# Patient Record
Sex: Male | Born: 1945 | Race: Black or African American | Hispanic: No | Marital: Married | Smoking: Former smoker
Health system: Southern US, Community
[De-identification: ages and names within clinical notes are randomized; demographics above are authoritative.]

## PROBLEM LIST (undated history)

## (undated) DIAGNOSIS — E785 Hyperlipidemia, unspecified: Secondary | ICD-10-CM

## (undated) DIAGNOSIS — N529 Male erectile dysfunction, unspecified: Secondary | ICD-10-CM

## (undated) DIAGNOSIS — H8110 Benign paroxysmal vertigo, unspecified ear: Secondary | ICD-10-CM

## (undated) DIAGNOSIS — I2510 Atherosclerotic heart disease of native coronary artery without angina pectoris: Secondary | ICD-10-CM

## (undated) DIAGNOSIS — Z9189 Other specified personal risk factors, not elsewhere classified: Secondary | ICD-10-CM

## (undated) DIAGNOSIS — Z973 Presence of spectacles and contact lenses: Secondary | ICD-10-CM

## (undated) DIAGNOSIS — I739 Peripheral vascular disease, unspecified: Secondary | ICD-10-CM

## (undated) DIAGNOSIS — E119 Type 2 diabetes mellitus without complications: Secondary | ICD-10-CM

## (undated) DIAGNOSIS — K219 Gastro-esophageal reflux disease without esophagitis: Secondary | ICD-10-CM

## (undated) DIAGNOSIS — I259 Chronic ischemic heart disease, unspecified: Secondary | ICD-10-CM

## (undated) DIAGNOSIS — I10 Essential (primary) hypertension: Secondary | ICD-10-CM

## (undated) DIAGNOSIS — I509 Heart failure, unspecified: Secondary | ICD-10-CM

## (undated) DIAGNOSIS — Z5189 Encounter for other specified aftercare: Secondary | ICD-10-CM

## (undated) DIAGNOSIS — M1990 Unspecified osteoarthritis, unspecified site: Secondary | ICD-10-CM

## (undated) DIAGNOSIS — N400 Enlarged prostate without lower urinary tract symptoms: Secondary | ICD-10-CM

## (undated) DIAGNOSIS — Z8679 Personal history of other diseases of the circulatory system: Secondary | ICD-10-CM

## (undated) HISTORY — DX: Benign prostatic hyperplasia without lower urinary tract symptoms: N40.0

## (undated) HISTORY — DX: Heart failure, unspecified: I50.9

## (undated) HISTORY — PX: CORONARY ARTERY BYPASS GRAFT: SHX141

## (undated) HISTORY — DX: Peripheral vascular disease, unspecified: I73.9

## (undated) HISTORY — DX: Hyperlipidemia, unspecified: E78.5

## (undated) HISTORY — DX: Benign paroxysmal vertigo, unspecified ear: H81.10

## (undated) HISTORY — DX: Unspecified osteoarthritis, unspecified site: M19.90

## (undated) HISTORY — DX: Essential (primary) hypertension: I10

## (undated) HISTORY — DX: Atherosclerotic heart disease of native coronary artery without angina pectoris: I25.10

## (undated) HISTORY — DX: Chronic ischemic heart disease, unspecified: I25.9

## (undated) HISTORY — PX: CARDIOVASCULAR STRESS TEST: SHX262

## (undated) HISTORY — DX: Encounter for other specified aftercare: Z51.89

## (undated) HISTORY — PX: CARDIAC CATHETERIZATION: SHX172

---

## 1998-03-07 DIAGNOSIS — Z5189 Encounter for other specified aftercare: Secondary | ICD-10-CM

## 1998-03-07 HISTORY — DX: Encounter for other specified aftercare: Z51.89

## 2000-03-03 ENCOUNTER — Encounter: Payer: Self-pay | Admitting: *Deleted

## 2000-03-03 ENCOUNTER — Encounter (INDEPENDENT_AMBULATORY_CARE_PROVIDER_SITE_OTHER): Payer: Self-pay | Admitting: *Deleted

## 2000-03-03 ENCOUNTER — Encounter: Payer: Self-pay | Admitting: Cardiothoracic Surgery

## 2000-03-04 ENCOUNTER — Encounter: Payer: Self-pay | Admitting: *Deleted

## 2000-03-04 ENCOUNTER — Encounter: Payer: Self-pay | Admitting: Cardiothoracic Surgery

## 2000-03-04 ENCOUNTER — Inpatient Hospital Stay (HOSPITAL_COMMUNITY): Admission: RE | Admit: 2000-03-04 | Discharge: 2000-03-11 | Payer: Self-pay | Admitting: *Deleted

## 2000-03-06 ENCOUNTER — Encounter: Payer: Self-pay | Admitting: Cardiothoracic Surgery

## 2000-03-07 ENCOUNTER — Encounter: Payer: Self-pay | Admitting: Cardiothoracic Surgery

## 2000-03-08 ENCOUNTER — Encounter: Payer: Self-pay | Admitting: Cardiothoracic Surgery

## 2000-03-21 ENCOUNTER — Encounter (HOSPITAL_COMMUNITY): Admission: RE | Admit: 2000-03-21 | Discharge: 2000-04-03 | Payer: Self-pay | Admitting: Internal Medicine

## 2000-03-22 ENCOUNTER — Encounter: Admission: RE | Admit: 2000-03-22 | Discharge: 2000-06-20 | Payer: Self-pay | Admitting: Internal Medicine

## 2000-04-05 ENCOUNTER — Encounter (HOSPITAL_COMMUNITY): Admission: RE | Admit: 2000-04-05 | Discharge: 2000-07-04 | Payer: Self-pay | Admitting: Internal Medicine

## 2000-07-05 ENCOUNTER — Encounter (HOSPITAL_COMMUNITY): Admission: RE | Admit: 2000-07-05 | Discharge: 2000-10-03 | Payer: Self-pay | Admitting: Internal Medicine

## 2004-01-08 ENCOUNTER — Ambulatory Visit: Payer: Self-pay | Admitting: Internal Medicine

## 2004-02-16 ENCOUNTER — Ambulatory Visit: Payer: Self-pay | Admitting: Internal Medicine

## 2004-03-03 ENCOUNTER — Ambulatory Visit: Payer: Self-pay | Admitting: Internal Medicine

## 2004-04-07 ENCOUNTER — Ambulatory Visit: Payer: Self-pay | Admitting: Internal Medicine

## 2004-05-27 ENCOUNTER — Ambulatory Visit: Payer: Self-pay | Admitting: Internal Medicine

## 2004-06-01 ENCOUNTER — Ambulatory Visit: Payer: Self-pay | Admitting: Internal Medicine

## 2004-06-28 ENCOUNTER — Ambulatory Visit: Payer: Self-pay | Admitting: Internal Medicine

## 2004-08-24 ENCOUNTER — Ambulatory Visit: Payer: Self-pay | Admitting: Internal Medicine

## 2004-08-31 ENCOUNTER — Ambulatory Visit: Payer: Self-pay | Admitting: Internal Medicine

## 2004-12-03 ENCOUNTER — Ambulatory Visit: Payer: Self-pay | Admitting: Internal Medicine

## 2004-12-17 ENCOUNTER — Ambulatory Visit: Payer: Self-pay | Admitting: Internal Medicine

## 2004-12-22 ENCOUNTER — Ambulatory Visit: Payer: Self-pay | Admitting: Internal Medicine

## 2005-01-06 ENCOUNTER — Ambulatory Visit: Payer: Self-pay | Admitting: Internal Medicine

## 2005-02-07 ENCOUNTER — Ambulatory Visit: Payer: Self-pay | Admitting: Internal Medicine

## 2005-03-28 ENCOUNTER — Ambulatory Visit: Payer: Self-pay | Admitting: Internal Medicine

## 2005-06-15 ENCOUNTER — Ambulatory Visit: Payer: Self-pay | Admitting: Internal Medicine

## 2005-06-23 ENCOUNTER — Ambulatory Visit: Payer: Self-pay | Admitting: Internal Medicine

## 2005-06-28 ENCOUNTER — Ambulatory Visit: Payer: Self-pay | Admitting: Internal Medicine

## 2006-01-30 ENCOUNTER — Ambulatory Visit: Payer: Self-pay | Admitting: Internal Medicine

## 2006-02-22 ENCOUNTER — Ambulatory Visit: Payer: Self-pay | Admitting: Internal Medicine

## 2006-06-29 ENCOUNTER — Emergency Department (HOSPITAL_COMMUNITY): Admission: EM | Admit: 2006-06-29 | Discharge: 2006-06-29 | Payer: Self-pay | Admitting: Emergency Medicine

## 2006-06-29 ENCOUNTER — Ambulatory Visit: Payer: Self-pay | Admitting: Internal Medicine

## 2006-07-06 ENCOUNTER — Ambulatory Visit: Payer: Self-pay

## 2006-07-06 LAB — CONVERTED CEMR LAB
BUN: 14 mg/dL (ref 6–23)
CO2: 31 meq/L (ref 19–32)
Calcium: 9.3 mg/dL (ref 8.4–10.5)
Chloride: 106 meq/L (ref 96–112)
Creatinine, Ser: 0.9 mg/dL (ref 0.4–1.5)
GFR calc Af Amer: 110 mL/min
GFR calc non Af Amer: 91 mL/min
Glucose, Bld: 133 mg/dL — ABNORMAL HIGH (ref 70–99)
Potassium: 4.2 meq/L (ref 3.5–5.1)
Sodium: 142 meq/L (ref 135–145)

## 2007-02-20 ENCOUNTER — Ambulatory Visit: Payer: Self-pay | Admitting: Internal Medicine

## 2007-02-23 ENCOUNTER — Ambulatory Visit: Payer: Self-pay | Admitting: Internal Medicine

## 2007-02-23 LAB — CONVERTED CEMR LAB
AST: 22 units/L (ref 0–37)
Albumin: 3.6 g/dL (ref 3.5–5.2)
Alkaline Phosphatase: 86 units/L (ref 39–117)
BUN: 10 mg/dL (ref 6–23)
Basophils Absolute: 0 10*3/uL (ref 0.0–0.1)
Bilirubin Urine: NEGATIVE
Calcium: 9.1 mg/dL (ref 8.4–10.5)
Chloride: 103 meq/L (ref 96–112)
Cholesterol: 193 mg/dL (ref 0–200)
Creatinine, Ser: 1 mg/dL (ref 0.4–1.5)
Eosinophils Absolute: 0.4 10*3/uL (ref 0.0–0.6)
GFR calc non Af Amer: 80 mL/min
Ketones, ur: NEGATIVE mg/dL
MCHC: 34.3 g/dL (ref 30.0–36.0)
MCV: 84.6 fL (ref 78.0–100.0)
Monocytes Relative: 8.4 % (ref 3.0–11.0)
Neutrophils Relative %: 50.4 % (ref 43.0–77.0)
PSA: 0.96 ng/mL (ref 0.10–4.00)
Platelets: 249 10*3/uL (ref 150–400)
RBC: 4.54 M/uL (ref 4.22–5.81)
RDW: 13.2 % (ref 11.5–14.6)
Sodium: 138 meq/L (ref 135–145)
Total CHOL/HDL Ratio: 6.7
Triglycerides: 208 mg/dL (ref 0–149)
Urine Glucose: NEGATIVE mg/dL
Urobilinogen, UA: 1 (ref 0.0–1.0)
VLDL: 42 mg/dL — ABNORMAL HIGH (ref 0–40)
pH: 6 (ref 5.0–8.0)

## 2007-03-02 ENCOUNTER — Ambulatory Visit: Payer: Self-pay | Admitting: Internal Medicine

## 2007-03-02 DIAGNOSIS — K219 Gastro-esophageal reflux disease without esophagitis: Secondary | ICD-10-CM | POA: Insufficient documentation

## 2007-03-02 DIAGNOSIS — E785 Hyperlipidemia, unspecified: Secondary | ICD-10-CM | POA: Insufficient documentation

## 2007-03-02 DIAGNOSIS — I2510 Atherosclerotic heart disease of native coronary artery without angina pectoris: Secondary | ICD-10-CM | POA: Insufficient documentation

## 2007-03-02 DIAGNOSIS — I10 Essential (primary) hypertension: Secondary | ICD-10-CM | POA: Insufficient documentation

## 2007-03-02 DIAGNOSIS — E1159 Type 2 diabetes mellitus with other circulatory complications: Secondary | ICD-10-CM

## 2007-03-16 ENCOUNTER — Telehealth: Payer: Self-pay | Admitting: Internal Medicine

## 2007-03-20 ENCOUNTER — Telehealth: Payer: Self-pay | Admitting: Internal Medicine

## 2007-07-02 ENCOUNTER — Encounter: Payer: Self-pay | Admitting: Internal Medicine

## 2007-08-02 ENCOUNTER — Ambulatory Visit: Payer: Self-pay | Admitting: Internal Medicine

## 2007-08-05 LAB — CONVERTED CEMR LAB
ALT: 16 units/L (ref 0–53)
AST: 16 units/L (ref 0–37)
Albumin: 3.6 g/dL (ref 3.5–5.2)
BUN: 12 mg/dL (ref 6–23)
CO2: 28 meq/L (ref 19–32)
Calcium: 9.3 mg/dL (ref 8.4–10.5)
Chloride: 102 meq/L (ref 96–112)
Cholesterol: 158 mg/dL (ref 0–200)
Creatinine, Ser: 0.9 mg/dL (ref 0.4–1.5)
GFR calc non Af Amer: 91 mL/min
HDL: 24.3 mg/dL — ABNORMAL LOW (ref >39.0)
Hgb A1c MFr Bld: 7.1 % — ABNORMAL HIGH (ref 4.6–6.0)
LDL Cholesterol: 111 mg/dL — ABNORMAL HIGH (ref 0–99)
Triglycerides: 112 mg/dL (ref 0–149)

## 2007-08-07 ENCOUNTER — Encounter: Payer: Self-pay | Admitting: *Deleted

## 2007-08-07 ENCOUNTER — Ambulatory Visit: Payer: Self-pay | Admitting: Internal Medicine

## 2007-08-07 DIAGNOSIS — Z951 Presence of aortocoronary bypass graft: Secondary | ICD-10-CM | POA: Insufficient documentation

## 2007-08-07 DIAGNOSIS — M129 Arthropathy, unspecified: Secondary | ICD-10-CM

## 2007-08-07 DIAGNOSIS — I259 Chronic ischemic heart disease, unspecified: Secondary | ICD-10-CM

## 2007-08-07 DIAGNOSIS — Z8669 Personal history of other diseases of the nervous system and sense organs: Secondary | ICD-10-CM | POA: Insufficient documentation

## 2007-08-28 ENCOUNTER — Ambulatory Visit: Payer: Self-pay | Admitting: Internal Medicine

## 2007-08-28 DIAGNOSIS — M25559 Pain in unspecified hip: Secondary | ICD-10-CM | POA: Insufficient documentation

## 2007-09-18 ENCOUNTER — Telehealth: Payer: Self-pay | Admitting: Internal Medicine

## 2007-10-08 ENCOUNTER — Ambulatory Visit: Payer: Self-pay | Admitting: Family Medicine

## 2007-10-08 DIAGNOSIS — M169 Osteoarthritis of hip, unspecified: Secondary | ICD-10-CM

## 2007-10-08 DIAGNOSIS — M1610 Unilateral primary osteoarthritis, unspecified hip: Secondary | ICD-10-CM | POA: Insufficient documentation

## 2007-10-09 ENCOUNTER — Encounter: Payer: Self-pay | Admitting: Family Medicine

## 2008-02-15 ENCOUNTER — Ambulatory Visit: Payer: Self-pay | Admitting: Internal Medicine

## 2008-02-28 ENCOUNTER — Ambulatory Visit: Payer: Self-pay | Admitting: Internal Medicine

## 2008-05-27 ENCOUNTER — Ambulatory Visit: Payer: Self-pay | Admitting: Internal Medicine

## 2008-09-16 ENCOUNTER — Ambulatory Visit: Payer: Self-pay | Admitting: Internal Medicine

## 2008-09-16 LAB — CONVERTED CEMR LAB
Bilirubin, Direct: 0.1 mg/dL (ref 0.0–0.3)
Calcium: 9.3 mg/dL (ref 8.4–10.5)
Creatinine, Ser: 0.8 mg/dL (ref 0.4–1.5)
HDL: 33.9 mg/dL — ABNORMAL LOW (ref >39.00)
Hgb A1c MFr Bld: 9.5 % — ABNORMAL HIGH (ref 4.6–6.5)
Total Bilirubin: 0.9 mg/dL (ref 0.3–1.2)
Total CHOL/HDL Ratio: 5
Triglycerides: 224 mg/dL — ABNORMAL HIGH (ref 0.0–149.0)
VLDL: 44.8 mg/dL — ABNORMAL HIGH (ref 0.0–40.0)

## 2008-09-19 ENCOUNTER — Ambulatory Visit: Payer: Self-pay | Admitting: Internal Medicine

## 2008-09-22 ENCOUNTER — Encounter: Payer: Self-pay | Admitting: Internal Medicine

## 2008-10-24 ENCOUNTER — Ambulatory Visit: Payer: Self-pay | Admitting: Internal Medicine

## 2008-10-24 DIAGNOSIS — N320 Bladder-neck obstruction: Secondary | ICD-10-CM

## 2008-10-28 LAB — CONVERTED CEMR LAB
Bilirubin Urine: NEGATIVE
Ketones, ur: NEGATIVE mg/dL
Nitrite: NEGATIVE
Total Protein, Urine: NEGATIVE mg/dL
pH: 5 (ref 5.0–8.0)

## 2008-12-01 IMAGING — CR DG HIP (WITH OR WITHOUT PELVIS) 2-3V*L*
3 series · 3 of 3 positions shown · non-contrast
Comparison: None available

CLINICAL DATA: Pain

LEFT HIP - COMPLETE 2+ VIEW

[view not recorded (1 of 3)]
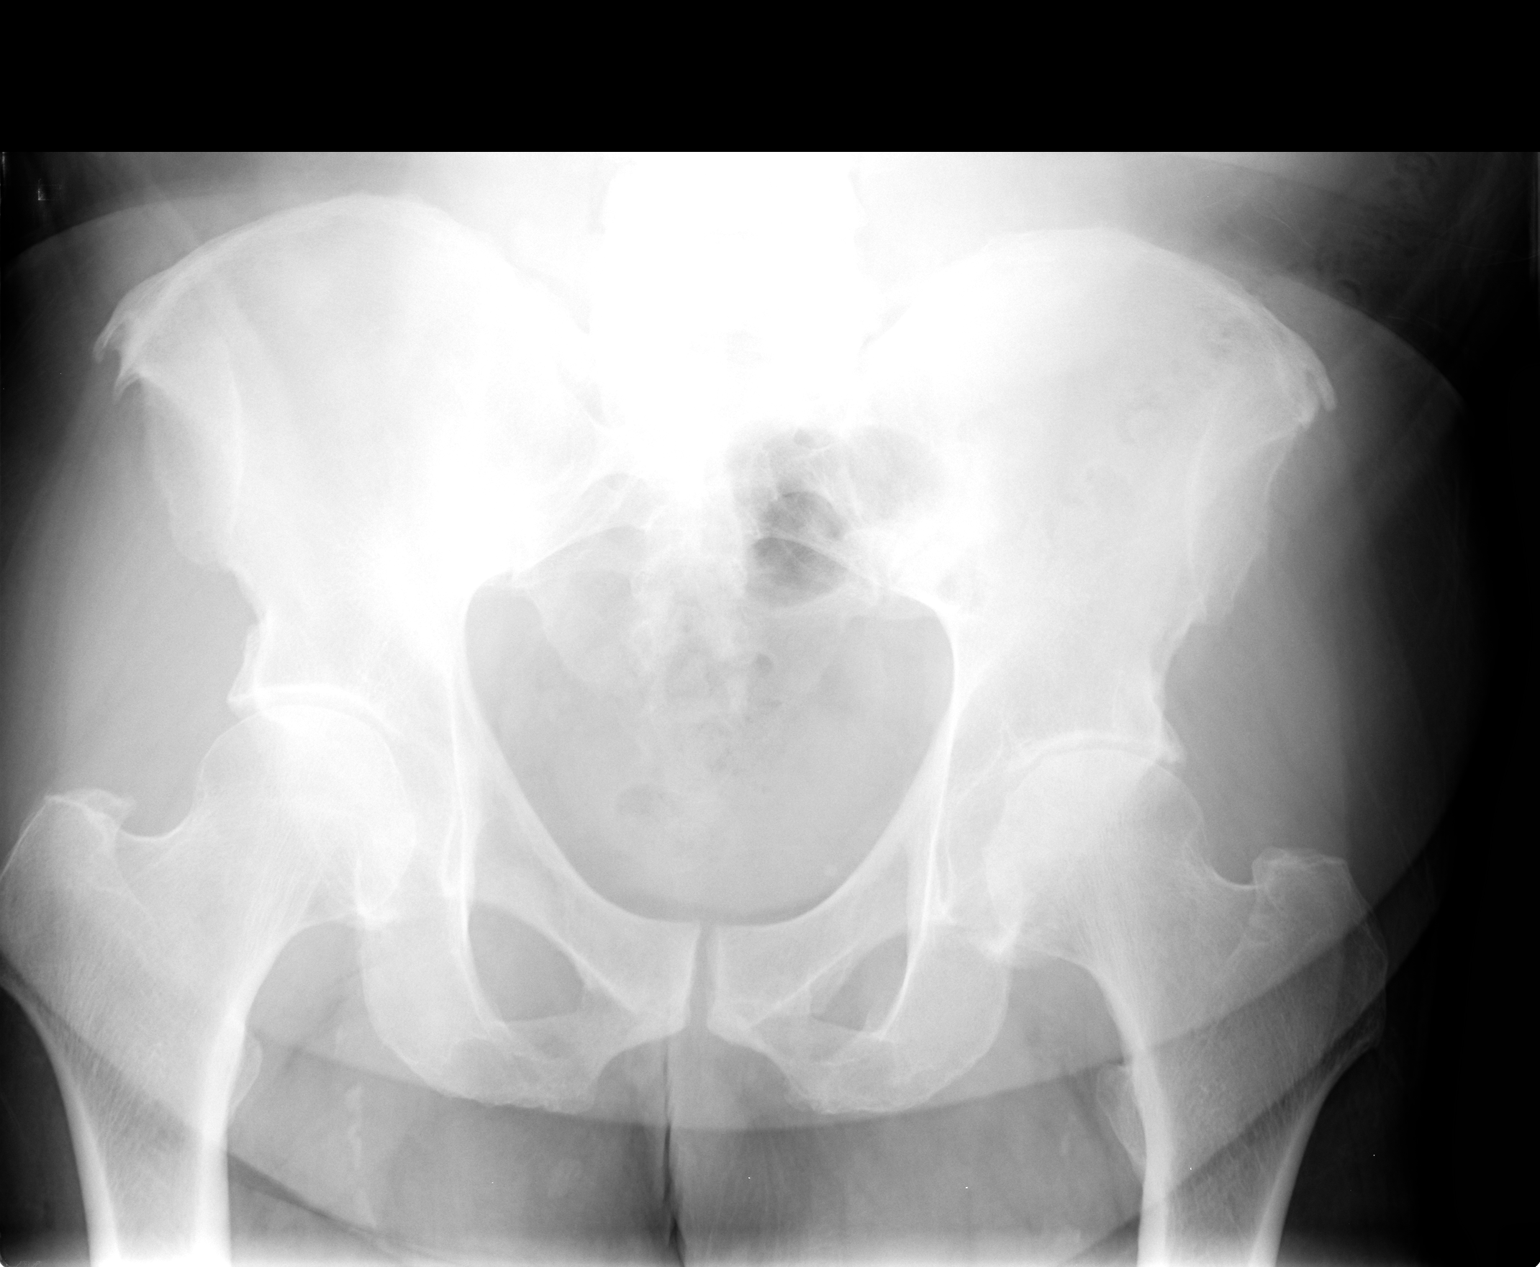

[view not recorded (2 of 3)]
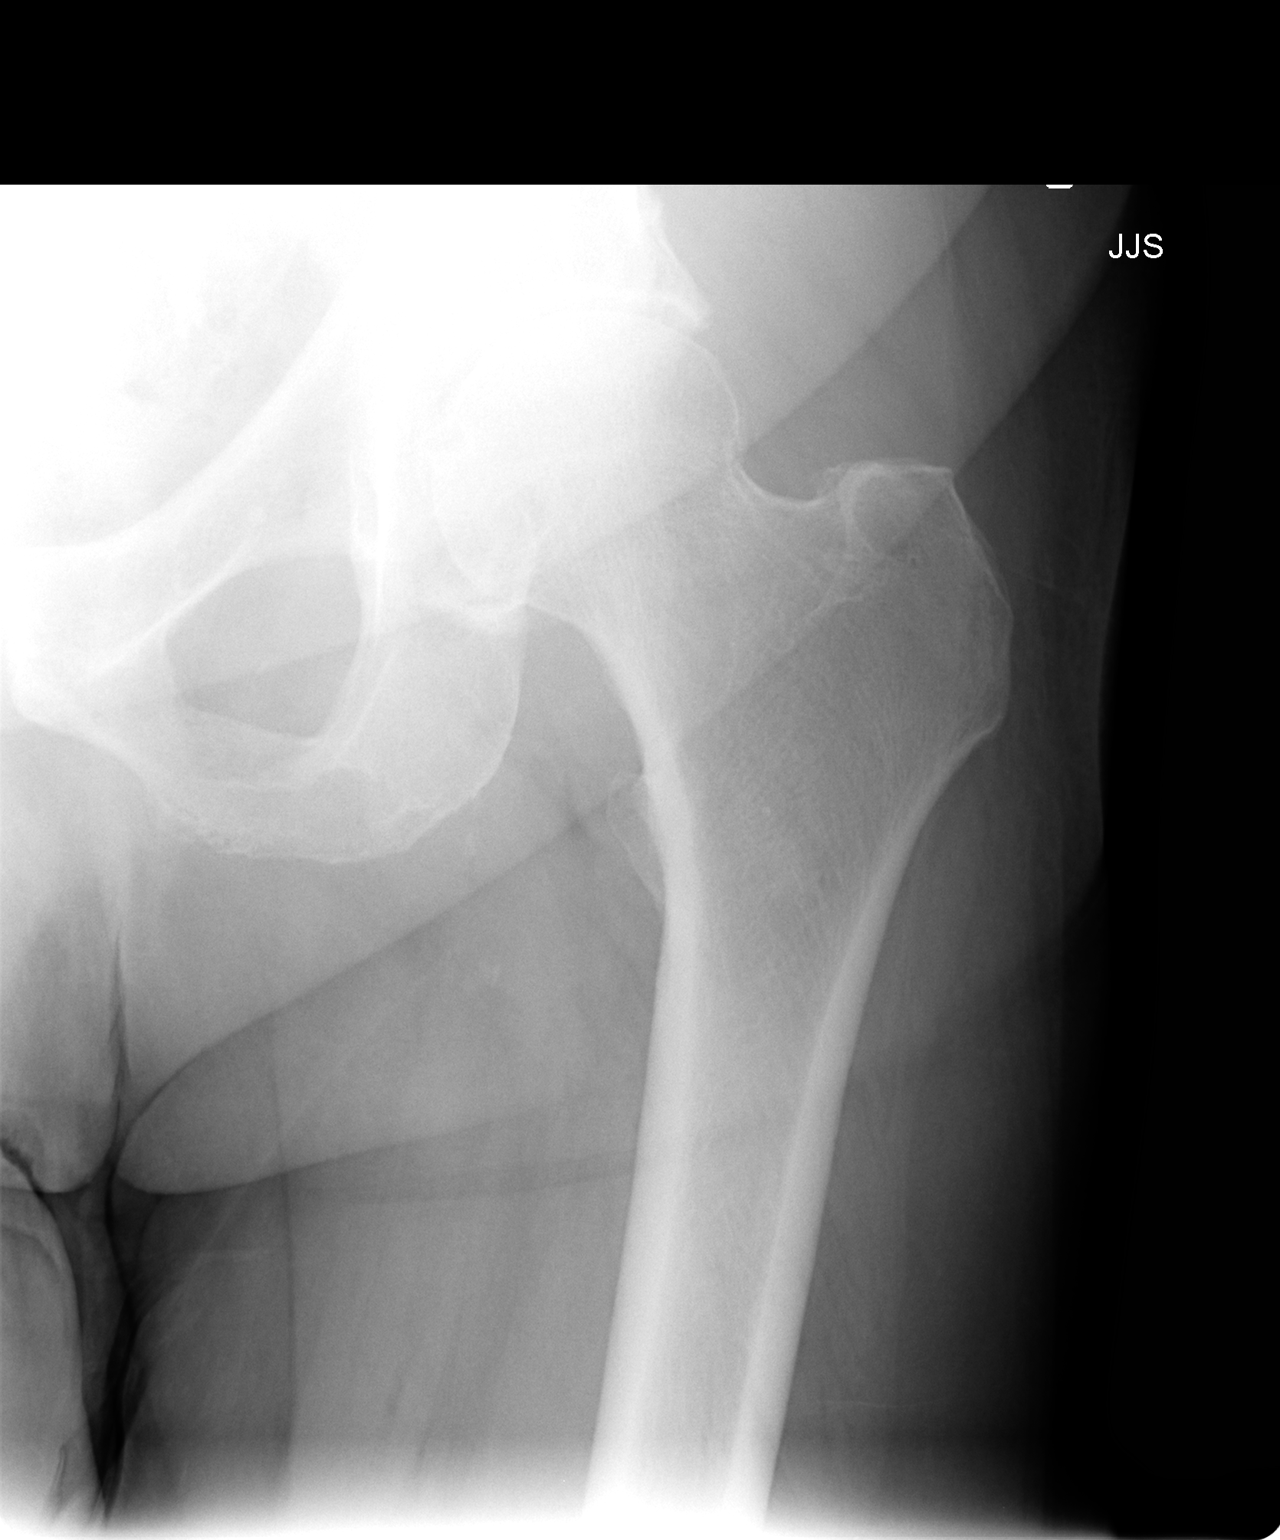

[view not recorded (3 of 3)]
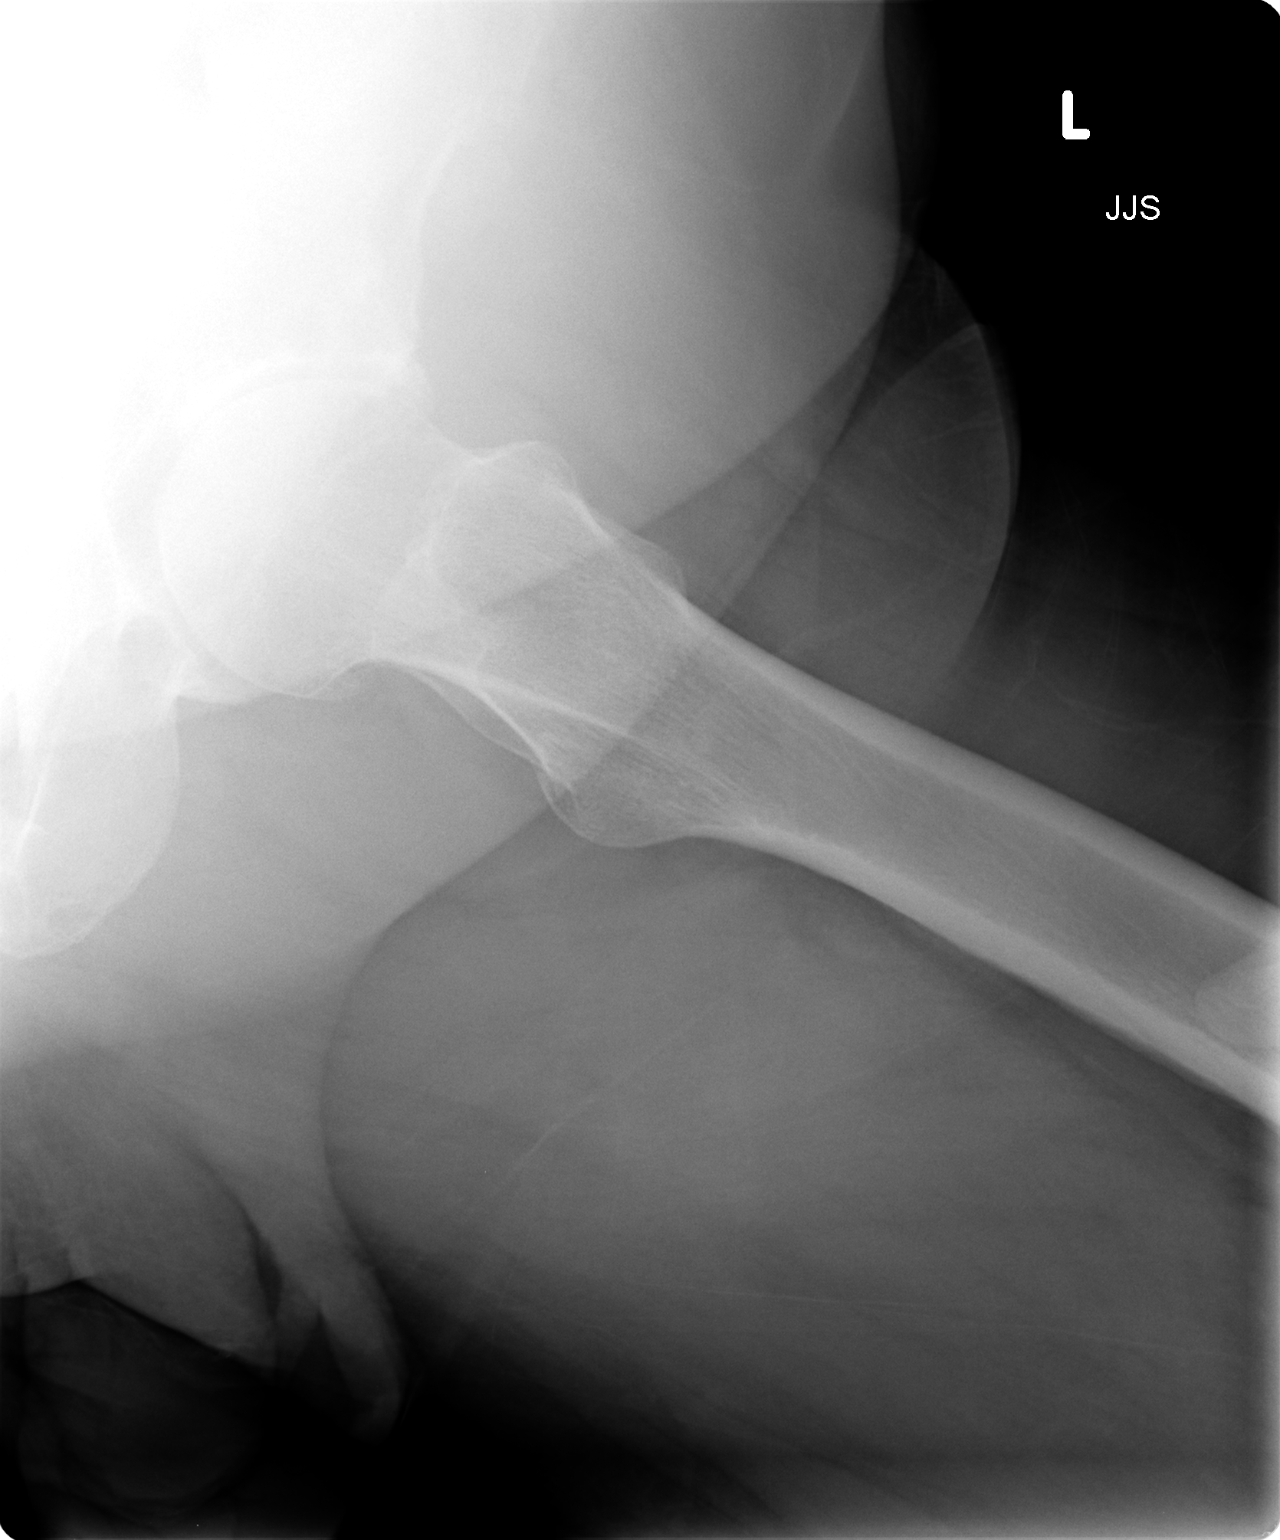

[3 of 3 positions shown; findings below may reference images not displayed]

FINDINGS: There is narrowing of the articular cartilage in the
hips, left worse than right.  Small marginal spurs are noted from
the left acetabulum.  Negative for fracture, dislocation, or other
acute bone injury.  Patchy femoral vascular calcifications are
noted.
IMPRESSION: 1.  Negative for fracture or other acute bone injury.
2.  Mild degenerative changes, left greater than right.

## 2008-12-23 ENCOUNTER — Ambulatory Visit: Payer: Self-pay | Admitting: Internal Medicine

## 2008-12-23 DIAGNOSIS — L299 Pruritus, unspecified: Secondary | ICD-10-CM | POA: Insufficient documentation

## 2008-12-23 DIAGNOSIS — Z87891 Personal history of nicotine dependence: Secondary | ICD-10-CM | POA: Insufficient documentation

## 2008-12-25 LAB — CONVERTED CEMR LAB
ALT: 20 units/L (ref 0–53)
Bilirubin, Direct: 0.1 mg/dL (ref 0.0–0.3)
Chloride: 105 meq/L (ref 96–112)
Creatinine, Ser: 0.8 mg/dL (ref 0.4–1.5)
Hgb A1c MFr Bld: 8.5 % — ABNORMAL HIGH (ref 4.6–6.5)
TSH: 4.08 microintl units/mL (ref 0.35–5.50)
Total Bilirubin: 0.5 mg/dL (ref 0.3–1.2)

## 2009-03-07 HISTORY — PX: CATARACT EXTRACTION W/ INTRAOCULAR LENS IMPLANT: SHX1309

## 2009-03-27 ENCOUNTER — Ambulatory Visit: Payer: Self-pay | Admitting: Internal Medicine

## 2009-04-06 ENCOUNTER — Ambulatory Visit: Payer: Self-pay | Admitting: Internal Medicine

## 2009-04-23 ENCOUNTER — Ambulatory Visit: Payer: Self-pay | Admitting: Internal Medicine

## 2009-04-23 DIAGNOSIS — J209 Acute bronchitis, unspecified: Secondary | ICD-10-CM | POA: Insufficient documentation

## 2009-04-23 DIAGNOSIS — N476 Balanoposthitis: Secondary | ICD-10-CM | POA: Insufficient documentation

## 2009-05-08 ENCOUNTER — Telehealth (INDEPENDENT_AMBULATORY_CARE_PROVIDER_SITE_OTHER): Payer: Self-pay | Admitting: *Deleted

## 2009-05-15 ENCOUNTER — Telehealth: Payer: Self-pay | Admitting: Internal Medicine

## 2009-05-25 ENCOUNTER — Ambulatory Visit: Payer: Self-pay | Admitting: Internal Medicine

## 2009-08-10 ENCOUNTER — Ambulatory Visit: Payer: Self-pay | Admitting: Internal Medicine

## 2009-08-10 ENCOUNTER — Telehealth: Payer: Self-pay | Admitting: Internal Medicine

## 2009-08-10 DIAGNOSIS — M940 Chondrocostal junction syndrome [Tietze]: Secondary | ICD-10-CM

## 2009-08-10 DIAGNOSIS — R079 Chest pain, unspecified: Secondary | ICD-10-CM | POA: Insufficient documentation

## 2009-08-13 ENCOUNTER — Ambulatory Visit: Payer: Self-pay | Admitting: Internal Medicine

## 2009-08-14 LAB — CONVERTED CEMR LAB
Albumin: 3.7 g/dL (ref 3.5–5.2)
CO2: 29 meq/L (ref 19–32)
Chloride: 107 meq/L (ref 96–112)
Creatinine, Ser: 0.9 mg/dL (ref 0.4–1.5)
HDL: 30.1 mg/dL — ABNORMAL LOW (ref >39.00)
Hgb A1c MFr Bld: 8.2 % — ABNORMAL HIGH (ref 4.6–6.5)
LDL Cholesterol: 90 mg/dL (ref 0–99)
Potassium: 4.4 meq/L (ref 3.5–5.1)
Sodium: 142 meq/L (ref 135–145)
Total CHOL/HDL Ratio: 5
Triglycerides: 145 mg/dL (ref 0.0–149.0)
VLDL: 29 mg/dL (ref 0.0–40.0)

## 2009-10-27 ENCOUNTER — Ambulatory Visit: Payer: Self-pay | Admitting: Internal Medicine

## 2009-10-27 LAB — CONVERTED CEMR LAB
ALT: 17 units/L (ref 0–53)
Albumin: 4 g/dL (ref 3.5–5.2)
Chloride: 103 meq/L (ref 96–112)
Cholesterol: 161 mg/dL (ref 0–200)
GFR calc non Af Amer: 132.37 mL/min (ref >60)
LDL Cholesterol: 103 mg/dL — ABNORMAL HIGH (ref 0–99)
Potassium: 4.6 meq/L (ref 3.5–5.1)
Sodium: 140 meq/L (ref 135–145)
Total Protein: 6.9 g/dL (ref 6.0–8.3)
Triglycerides: 136 mg/dL (ref 0.0–149.0)
VLDL: 27.2 mg/dL (ref 0.0–40.0)

## 2009-10-30 ENCOUNTER — Ambulatory Visit: Payer: Self-pay | Admitting: Internal Medicine

## 2009-10-30 DIAGNOSIS — B079 Viral wart, unspecified: Secondary | ICD-10-CM | POA: Insufficient documentation

## 2009-11-06 ENCOUNTER — Encounter (INDEPENDENT_AMBULATORY_CARE_PROVIDER_SITE_OTHER): Payer: Self-pay | Admitting: *Deleted

## 2009-12-05 DIAGNOSIS — D126 Benign neoplasm of colon, unspecified: Secondary | ICD-10-CM

## 2009-12-05 HISTORY — DX: Benign neoplasm of colon, unspecified: D12.6

## 2009-12-07 ENCOUNTER — Encounter: Payer: Self-pay | Admitting: Internal Medicine

## 2009-12-14 ENCOUNTER — Encounter (INDEPENDENT_AMBULATORY_CARE_PROVIDER_SITE_OTHER): Payer: Self-pay | Admitting: *Deleted

## 2009-12-16 ENCOUNTER — Ambulatory Visit: Payer: Self-pay | Admitting: Gastroenterology

## 2009-12-29 ENCOUNTER — Telehealth: Payer: Self-pay | Admitting: Gastroenterology

## 2009-12-30 ENCOUNTER — Ambulatory Visit: Payer: Self-pay | Admitting: Gastroenterology

## 2009-12-30 HISTORY — PX: POLYPECTOMY: SHX149

## 2009-12-30 HISTORY — PX: COLONOSCOPY: SHX174

## 2009-12-30 LAB — HM COLONOSCOPY

## 2010-01-03 ENCOUNTER — Encounter: Payer: Self-pay | Admitting: Gastroenterology

## 2010-01-13 ENCOUNTER — Encounter: Payer: Self-pay | Admitting: Internal Medicine

## 2010-01-18 ENCOUNTER — Telehealth: Payer: Self-pay | Admitting: Internal Medicine

## 2010-02-02 ENCOUNTER — Telehealth (INDEPENDENT_AMBULATORY_CARE_PROVIDER_SITE_OTHER): Payer: Self-pay | Admitting: *Deleted

## 2010-02-08 ENCOUNTER — Ambulatory Visit
Admission: RE | Admit: 2010-02-08 | Discharge: 2010-02-09 | Payer: Self-pay | Source: Home / Self Care | Admitting: Urology

## 2010-02-08 HISTORY — PX: PENILE PROSTHESIS IMPLANT: SHX240

## 2010-02-15 ENCOUNTER — Encounter: Payer: Self-pay | Admitting: Internal Medicine

## 2010-03-05 ENCOUNTER — Ambulatory Visit: Payer: Self-pay | Admitting: Internal Medicine

## 2010-03-05 LAB — CONVERTED CEMR LAB
ALT: 16 units/L (ref 0–53)
AST: 11 units/L (ref 0–37)
Alkaline Phosphatase: 81 units/L (ref 39–117)
Bilirubin, Direct: 0.1 mg/dL (ref 0.0–0.3)
CO2: 27 meq/L (ref 19–32)
Chloride: 106 meq/L (ref 96–112)
Creatinine, Ser: 0.9 mg/dL (ref 0.4–1.5)
Potassium: 4.4 meq/L (ref 3.5–5.1)
Sodium: 139 meq/L (ref 135–145)
Total Bilirubin: 0.7 mg/dL (ref 0.3–1.2)
Total Protein: 6.8 g/dL (ref 6.0–8.3)

## 2010-03-16 ENCOUNTER — Ambulatory Visit
Admission: RE | Admit: 2010-03-16 | Discharge: 2010-03-16 | Payer: Self-pay | Source: Home / Self Care | Attending: Internal Medicine | Admitting: Internal Medicine

## 2010-03-16 ENCOUNTER — Encounter: Payer: Self-pay | Admitting: Internal Medicine

## 2010-03-22 ENCOUNTER — Encounter: Payer: Self-pay | Admitting: Internal Medicine

## 2010-04-06 NOTE — Progress Notes (Signed)
Summary: Medication   Phone Note Call from Patient Call back at [phone]   Caller: Patient Call For: Dr. Russella Dar Reason for Call: Talk to Nurse Summary of Call: Moviprep is too expensive...Marland Kitchenneeds an alternative med. At the pharmacy now to pickup meds. Appt. tomorrow Initial call taken by: Karna Christmas,  December 29, 2009 12:37 PM  Follow-up for Phone Call        Pt.'s wife upset over the cost of the Movi prep.  Procedure is tomorrow and I told her I'd have to confer with Dr. Russella Dar to see what he wanted to substitute it for.  I also advised her that her husband would need a whole new set of instructions.  She wanted to cancel the procedure and I reminded her of the late cx fee of $100 vs the $64 for the prep plus she had a $20 rebate coupon which she said she didn't want to bother with.  She was upset that I couldn't order something right this minute because they were at [facility] and didn't want to waste the gas having to come back.  She also said he'd have his colon this time and they'd pay the price for the prep but not again.  I again told her I could get with Dr. Russella Dar but I'd have to wait until I saw my pts that were here for PV. Follow-up by: Wyona Almas RN,  December 29, 2009 1:47 PM

## 2010-04-06 NOTE — Progress Notes (Signed)
  Phone Note Refill Request Message from:  Fax from Pharmacy on May 15, 2009 1:32 PM  Refills Requested: Medication #1:  DOXYCYCLINE HYCLATE 100 MG CAPS 1 cap 2 x a day. Initial call taken by: Ami Bullins CMA,  May 15, 2009 1:32 PM    Prescriptions: DOXYCYCLINE HYCLATE 100 MG CAPS (DOXYCYCLINE HYCLATE) 1 cap 2 x a day  #20 x 0   Entered by:   Ami Bullins CMA   Authorized by:   Tresa Garter MD   Signed by:   Bill Salinas CMA on 05/15/2009   Method used:   Electronically to        [facility] [phone]* (retail)       [address]       [place], [place]  [address]       Ph: [phone]       Fax: [phone]   RxID:   [id number]

## 2010-04-06 NOTE — Procedures (Signed)
Summary: Colonoscopy  Patient: Johnny [name] Note: All result statuses are Final unless otherwise noted.  Tests: (1) Colonoscopy (COL)   COL Colonoscopy           DONE     [facility]     [address]     [place], [place]  [address]           COLONOSCOPY PROCEDURE REPORT           PATIENT:  Johnny [name], Johnny [name]  MR#:  [id number]     BIRTHDATE:  [birth year], 64 yrs. old  GENDER:  male     ENDOSCOPIST:  Judie Petit T. Russella Dar, MD, [facility]           PROCEDURE DATE:  12/30/2009     PROCEDURE:  Colonoscopy with biopsy     ASA CLASS:  Class II     INDICATIONS:  1) surveillance and high-risk screening  2) history     of colon polyps-type unknown     MEDICATIONS:   Fentanyl 50 mcg IV, Versed 6 mg IV     DESCRIPTION OF PROCEDURE:   After the risks benefits and     alternatives of the procedure were thoroughly explained, informed     consent was obtained.  Digital rectal exam was performed and     revealed no abnormalities.   The LB [id number]-[id number] endoscope     was introduced through the anus and advanced to the cecum, which     was identified by both the appendix and ileocecal valve, without     limitations.  The quality of the prep was good, using MoviPrep.     The instrument was then slowly withdrawn as the colon was fully     examined.     <<PROCEDUREIMAGES>>     FINDINGS:  Mild diverticulosis was found in the ascending colon.     Moderate diverticulosis was found in the sigmoid to descending     colon.  A sessile polyp was found in the mid transverse colon. It     was 4 mm in size. The polyp was removed using cold biopsy forceps.     This was otherwise a normal examination of the colon. Retroflexed     views in the rectum revealed no abnormalities. The time to cecum =     2.25  minutes. The scope was then withdrawn (time =  9.25  min)     from the patient and the procedure completed.     COMPLICATIONS:  None           ENDOSCOPIC IMPRESSION:     1) Mild diverticulosis in the  ascending colon     2) Moderate diverticulosis in the sigmoid to descending colon     3) 4 mm sessile polyp in the mid transverse colon           RECOMMENDATIONS:     1) Await pathology results     2) High fiber diet with liberal fluid intake.     3) If the polyp is adenomatous (pre-cancerous), you will need a     repeat colonoscopy in 5 years. Otherwise follow colorectal cancer     screening guidelines for "routine risk" patients with colonoscopy     in 10 years.           Venita Lick. Russella Dar, MD, [facility]           CC: Linda Hedges. Plotnikov, MD  n.     eSIGNED:   Malcolm T. Stark at 12/30/2009 11:53 AM           Daleen Squibb, Marilu Favre, [id number]  Note: An exclamation mark (!) indicates a result that was not dispersed into the flowsheet. Document Creation Date: 12/30/2009 11:53 AM _______________________________________________________________________  (1) Order result status: Final Collection or observation date-time: 12/30/2009 11:47 Requested date-time:  Receipt date-time:  Reported date-time:  Referring Physician:   Ordering Physician: Claudette Head [phone]) Specimen Source:  Source: Launa Grill Order Number: [id number] Lab site:   Appended Document: Colonoscopy     Procedures Next Due Date:    Colonoscopy: 12/2014

## 2010-04-06 NOTE — Progress Notes (Signed)
Summary: [facility]  Phone Note Refill Request   Refills Requested: Medication #1:  KETOCONAZOLE 2 % CREA use bid pt can not afford this cream and would like an alternative, he still has some of the previous cream left so pt said she would wait till next week for Dr Macario Golds response on this cream.  Initial call taken by: Ami Bullins CMA,  May 15, 2009 1:36 PM  Follow-up for Phone Call        Get OTC Lamsl generic Follow-up by: Tresa Garter MD,  May 17, 2009 10:51 PM  Additional Follow-up for Phone Call Additional follow up Details #1::        Pt informed, canceled rx that was sent to pharm in error. Pt continues to c/o "leakage" from penis that has not improved with meds. Transferred to schedulers for f/u office visit and advised office visit earlier with someone else if symptoms become worse.  Additional Follow-up by: Lamar Sprinkles, CMA,  May 18, 2009 9:02 AM    Prescriptions: KETOCONAZOLE 2 % CREA (KETOCONAZOLE) use bid  #90 g x 3   Entered by:   Lamar Sprinkles, CMA   Authorized by:   Tresa Garter MD   Signed by:   Lamar Sprinkles, CMA on 05/18/2009   Method used:   Electronically to        [facility]  [address] [phone]* (retail)       [address]       [place], [place]  [address]       Ph: [phone]       Fax: [phone]   RxID:   [id number]

## 2010-04-06 NOTE — Miscellaneous (Signed)
Summary: [facility] Previsit/prep  Clinical Lists Changes  Medications: Added new medication of MOVIPREP 100 GM  SOLR (PEG-KCL-NACL-NASULF-NA ASC-C) As per prep instructions. - Signed Rx of MOVIPREP 100 GM  SOLR (PEG-KCL-NACL-NASULF-NA ASC-C) As per prep instructions.;  #1 x 0;  Signed;  Entered by: Wyona Almas RN;  Authorized by: Meryl Dare MD [facility];  Method used: Electronically to [facility] [phone]*, [address], [place], [place]  [address], Ph: [phone], Fax: [phone] Observations: Added new observation of ALLERGY REV: Done (12/16/2009 9:03)    Prescriptions: MOVIPREP 100 GM  SOLR (PEG-KCL-NACL-NASULF-NA ASC-C) As per prep instructions.  #1 x 0   Entered by:   Wyona Almas RN   Authorized by:   Meryl Dare MD [facility]   Signed by:   Wyona Almas RN on 12/16/2009   Method used:   Electronically to        [facility] [phone]* (retail)       [address]       [place], [place]  [address]       Ph: [phone]       Fax: [phone]   RxID:   [id number]

## 2010-04-06 NOTE — Letter (Signed)
Summary: [facility] Instructions  [facility]  [address] [place], [place] [address]   Phone: [phone]  Fax: [phone]       Johnny [name]    1945-04-03    MRN: [id number]        Procedure Day Dorna Bloom:  Wednesday 12/30/2009     Arrival Time:  10:30 am      Procedure Time:  11:30 am     Location of Procedure:                    _ x_  [facility] (4th Floor)                         PREPARATION FOR COLONOSCOPY WITH MOVIPREP   Starting 5 days prior to your procedure Friday 10/21 do not eat nuts, seeds, popcorn, corn, beans, peas,  salads, or any raw vegetables.  Do not take any fiber supplements (e.g. Metamucil, Citrucel, and Benefiber).  THE DAY BEFORE YOUR PROCEDURE         DATE: Tuesday 10/25  1.  Drink clear liquids the entire day-NO SOLID FOOD  2.  Do not drink anything colored red or purple.  Avoid juices with pulp.  No orange juice.  3.  Drink at least 64 oz. (8 glasses) of fluid/clear liquids during the day to prevent dehydration and help the prep work efficiently.  CLEAR LIQUIDS INCLUDE: Water Jello Ice Popsicles Tea (sugar ok, no milk/cream) Powdered fruit flavored drinks Coffee (sugar ok, no milk/cream) Gatorade Juice: apple, white grape, white cranberry  Lemonade Clear bullion, consomm, broth Carbonated beverages (any kind) Strained chicken noodle soup Hard Candy                             4.  In the morning, mix first dose of MoviPrep solution:    Empty 1 Pouch A and 1 Pouch B into the disposable container    Add lukewarm drinking water to the top line of the container. Mix to dissolve    Refrigerate (mixed solution should be used within 24 hrs)  5.  Begin drinking the prep at 5:00 p.m. The MoviPrep container is divided by 4 marks.   Every 15 minutes drink the solution down to the next mark (approximately 8 oz) until the full liter is complete.   6.  Follow completed prep with 16 oz of clear liquid of your choice  (Nothing red or purple).  Continue to drink clear liquids until bedtime.  7.  Before going to bed, mix second dose of MoviPrep solution:    Empty 1 Pouch A and 1 Pouch B into the disposable container    Add lukewarm drinking water to the top line of the container. Mix to dissolve    Refrigerate  THE DAY OF YOUR PROCEDURE      DATE: Wednesday 10/26  Beginning at 6:30 a.m. (5 hours before procedure):         1. Every 15 minutes, drink the solution down to the next mark (approx 8 oz) until the full liter is complete.  2. Follow completed prep with 16 oz. of clear liquid of your choice.    3. You may drink clear liquids until 9:30 am (2 HOURS BEFORE PROCEDURE).   MEDICATION INSTRUCTIONS  Unless otherwise instructed, you should take regular prescription medications with a small sip of water   as early as possible  the morning of your procedure.  Diabetic patients - see separate instructions.  Additional medication instructions: Take Enalapril the morning of procedure.         OTHER INSTRUCTIONS  You will need a responsible adult at least 66 years of age to accompany you and drive you home.   This person must remain in the waiting room during your procedure.  Wear loose fitting clothing that is easily removed.  Leave jewelry and other valuables at home.  However, you may wish to bring a book to read or  an iPod/MP3 player to listen to music as you wait for your procedure to start.  Remove all body piercing jewelry and leave at home.  Total time from sign-in until discharge is approximately 2-3 hours.  You should go home directly after your procedure and rest.  You can resume normal activities the  day after your procedure.  The day of your procedure you should not:   Drive   Make legal decisions   Operate machinery   Drink alcohol   Return to work  You will receive specific instructions about eating, activities and medications before you leave.    The above  instructions have been reviewed and explained to me by  Wyona Almas RN  December 16, 2009 9:36 AM     I fully understand and can verbalize these instructions _____________________________ Date _________

## 2010-04-06 NOTE — Progress Notes (Signed)
Summary: pt having chest pain   Phone Note Call from Patient   Caller: Spouse Reason for Call: Talk to Nurse, Talk to Doctor Summary of Call: pt has been having chest pain since Thursday. no other symtoms.  Initial call taken by: Omer Jack,  August 10, 2009 8:12 AM  Follow-up for Phone Call        I spoke with Johnny [name] who is experiencing chest discomfort "feels like I pulled a muscle" on and off since Last Thursday.  It has not gotten worse but is still there.  I encouraged him to see Dr. Posey Rea (PCP) today as it did not sound cardiac.

## 2010-04-06 NOTE — Letter (Signed)
Summary: Diabetic Instructions  [facility]  [address] [place], [place] [address]   Phone: [phone]  Fax: [phone]    Johnny [name] 12-13-1945 MRN: [id number]   _ x _   ORAL DIABETIC MEDICATION INSTRUCTIONS  The day before your procedure:   Take your diabetic pill as you do normally  The day of your procedure:   Do not take your diabetic pill    We will check your blood sugar levels during the admission process and again in Recovery before discharging you home  ________________________________________________________________________

## 2010-04-06 NOTE — Assessment & Plan Note (Signed)
Summary: chest pain 3 days/feels like pullled muscle - no sob/ok triag...   Vital Signs:  Patient profile:   65 year old male Height:      71 inches Weight:      244.38 pounds BMI:     34.21 O2 Sat:      98 % on Room air Temp:     98.0 degrees F oral Pulse rate:   69 / minute BP sitting:   140 / 60  (left arm) Cuff size:   large  Vitals Entered By: Lucious Groves (August 10, 2009 2:36 PM)  O2 Flow:  Room air CC: C/O chest pain x3 days after taking Ranitidine instead or Prilosec./kb Is Patient Diabetic? Yes Comments Patient is unsure of his current meds and claims to not be taking Coreg, furosemide, vitamin D, and Doxycycline. Patient denies fever, SOB, tachycardia, HA, and blurry vision./kb   Primary Care Provider:  Tresa Garter MD  CC:  C/O chest pain x3 days after taking Ranitidine instead or Prilosec./kb.  History of Present Illness: C/o CP  after he stopped Prilose: started on Fri - worse with turning  Current Medications (verified): 1)  Enalapril Maleate 10 Mg  Tabs (Enalapril Maleate) .... Take 1 Tab By Mouth Daily 2)  Ranitidine Hcl 300 Mg Tabs (Ranitidine Hcl) .[name].. 1 Po Qd 3)  Metformin Hcl 1000 Mg Tabs (Metformin Hcl) .... Take 1 Tablet By Mouth Two Times A Day 4)  Lovastatin 20 Mg Tabs (Lovastatin) .... Take 1 Tab By Mouth Daily 5)  Aspirin 325 Mg Tabs (Aspirin) .... Take 1 Tab By Mouth Every Day 6)  Tramadol Hcl 50 Mg  Tabs (Tramadol Hcl) .[name].. 1-2 By Mouth Two Times A Day As Needed Pain 7)  Doxazosin Mesylate 8 Mg Tabs (Doxazosin Mesylate) .[name].. 1 By Mouth Qd 8)  Levitra 20 Mg Tabs (Vardenafil Hcl) .... 1/2-1 Once Daily As Needed 9)  Glimepiride 4 Mg Tabs (Glimepiride) .[name].. 1 Tablet By Mouth Daily  Allergies (verified): 1)  Hydrocodone-Acetaminophen (Hydrocodone-Acetaminophen)  Past History:  Past Medical History: Last updated: 10/08/2007 BENIGN PROSTATIC HYPERTROPHY, HX OF (ICD-V13.8) Hx of ARTHRITIS (ICD-716.90) BENIGN POSITIONAL VERTIGO, HX OF  (ICD-V12.49) ERECTILE DYSFUNCTION (ICD-302.72) ISCHEMIC HEART DISEASE (ICD-414.9) FAMILY HISTORY OF CAD MALE 1ST DEGREE RELATIVE <50 (ICD-V17.3) HYPERTENSION (ICD-401.9) HYPERLIPIDEMIA (ICD-272.4) GERD (ICD-530.81) DIABETES MELLITUS, TYPE II (ICD-250.00) CORONARY ARTERY DISEASE (ICD-414.00) OBSERVATION FOR SUSPECTED MALIGNANT NEOPLASM (ICD-V71.1) ROUTINE GENERAL MEDICAL EXAM@HEALTH  CARE FACL (ICD-V70.0)  Past Surgical History: Last updated: 08/07/2007 CORONARY ARTERY BYPASS GRAFT, THREE VESSEL, 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  The patient denies dyspnea on exertion, abdominal pain, melena, and syncope.    Physical Exam  General:  NAD, well-developed and overweight-appearing.   Mouth:  WNL Chest Selinger:  Tender B costochondral junctions Lungs:  CTA Heart:  Normal rate and regular rhythm. S1 and S2 normal without gallop, murmur, click, rub or other extra sounds. Abdomen:  Bowel sounds positive,abdomen soft and non-tender without masses, organomegaly or hernias noted. Skin:  Intact without suspicious lesions or rashes Psych:  Cognition and judgment appear intact. Alert and cooperative with normal attention span and concentration. No apparent delusions, illusions, hallucinations   Impression & Recommendations:  Problem # 1:  CHEST PAIN (ICD-786.50) due to #2 Assessment New  EKG - no acute changes  Orders: EKG w/ Interpretation (93000)  Problem #  2:  COSTOCHONDRITIS (ICD-733.6) Assessment: New Pennsaid given  Problem # 3:  GERD (ICD-530.81) Assessment: Unchanged  His updated medication list for this problem includes:    Ranitidine Hcl 300 Mg Tabs (Ranitidine hcl) .[name]... 1 po qd  Problem # 4:  DIABETES MELLITUS, TYPE II (ICD-250.00) Assessment: Unchanged  His updated medication list for this problem  includes:    Enalapril Maleate 10 Mg Tabs (Enalapril maleate) .[name]... Take 1 tab by mouth daily    Metformin Hcl 1000 Mg Tabs (Metformin hcl) .[name]... Take 1 tablet by mouth two times a day    Aspirin 325 Mg Tabs (Aspirin) .[name]... Take 1 tab by mouth every day    Glimepiride 4 Mg Tabs (Glimepiride) .[name]... 1 tablet by mouth daily  Complete Medication List: 1)  Enalapril Maleate 10 Mg Tabs (Enalapril maleate) .... Take 1 tab by mouth daily 2)  Ranitidine Hcl 300 Mg Tabs (Ranitidine hcl) .[name].. 1 po qd 3)  Metformin Hcl 1000 Mg Tabs (Metformin hcl) .... Take 1 tablet by mouth two times a day 4)  Lovastatin 20 Mg Tabs (Lovastatin) .... Take 1 tab by mouth daily 5)  Aspirin 325 Mg Tabs (Aspirin) .... Take 1 tab by mouth every day 6)  Tramadol Hcl 50 Mg Tabs (Tramadol hcl) .[name].. 1-2 by mouth two times a day as needed pain 7)  Doxazosin Mesylate 8 Mg Tabs (Doxazosin mesylate) .[name].. 1 by mouth qd 8)  Levitra 20 Mg Tabs (Vardenafil hcl) .... 1/2-1 once daily as needed 9)  Glimepiride 4 Mg Tabs (Glimepiride) .[name].. 1 tablet by mouth daily 10)  Pennsaid 1.5 % Soln (Diclofenac sodium) .... 3-5 gtt on skin three times a day for pain  Patient Instructions: 1)  Call if you are not better in a reasonable amount of time or if worse. Go to ER if feeling really bad!  2)  Please schedule a follow-up appointment in 3 months. 3)  Labs at any time 4)  BMP prior to visit, ICD-9: 5)  Hepatic Panel prior to visit, ICD-9: 6)  Lipid Panel prior to visit, ICD-9: 7)  HbgA1C prior to visit, ICD-9: Prescriptions: PENNSAID 1.5 % SOLN (DICLOFENAC SODIUM) 3-5 gtt on skin three times a day for pain  #1 x 3   Entered and Authorized by:   Tresa Garter MD   Signed by:   Tresa Garter MD on 08/10/2009   Method used:   Print then Give to Patient   RxID:   [id number]

## 2010-04-06 NOTE — Assessment & Plan Note (Signed)
Summary: 1 YR    Visit Type:  Follow-up Primary Provider:  Tresa Garter MD   History of Present Illness: Johnny [name] returns today for followup.  He is a very pleasant 65 year old male with a history of ischemic heart disease and hypertension status post bypass surgery.  He returns today for followup.  His blood pressure remains slightly elevated today and though he states that for the most part it is under better control, it still stays high at times.  He is exercising regularly.  He notes trouble losing weight.   Current Medications (verified): 1)  Furosemide 40 Mg Tabs (Furosemide) .[name].. 1once Daily 2)  Enalapril Maleate 10 Mg  Tabs (Enalapril Maleate) .... Take 1 Tab By Mouth Daily 3)  Ranitidine Hcl 300 Mg Tabs (Ranitidine Hcl) .[name].. 1 Po Qd 4)  Metformin Hcl 1000 Mg Tabs (Metformin Hcl) .... Take 1 Tablet By Mouth Two Times A Day 5)  Vitamin D3 1000 Unit  Tabs (Cholecalciferol) .[name].. 1 Qd 6)  Lovastatin 20 Mg Tabs (Lovastatin) .... Take 1 Tab By Mouth Daily 7)  Coreg 6.25 Mg Tabs (Carvedilol) .[name].. 1 By Mouth Bid 8)  Aspirin 325 Mg Tabs (Aspirin) .... Take 1 Tab By Mouth Every Day 9)  Tramadol Hcl 50 Mg  Tabs (Tramadol Hcl) .[name].. 1-2 By Mouth Two Times A Day As Needed Pain 10)  Doxazosin Mesylate 8 Mg Tabs (Doxazosin Mesylate) .[name].. 1 By Mouth Qd 11)  Levitra 20 Mg Tabs (Vardenafil Hcl) .... 1/2-1 Once Daily As Needed 12)  Glimepiride 4 Mg Tabs (Glimepiride) .[name].. 1 Tablet By Mouth Daily  Allergies: 1)  Hydrocodone-Acetaminophen (Hydrocodone-Acetaminophen)  Past History:  Past Medical History: Last updated: 10/08/2007 BENIGN PROSTATIC HYPERTROPHY, HX OF (ICD-V13.8) Hx of ARTHRITIS (ICD-716.90) BENIGN POSITIONAL VERTIGO, HX OF (ICD-V12.49) ERECTILE DYSFUNCTION (ICD-302.72) ISCHEMIC HEART DISEASE (ICD-414.9) FAMILY HISTORY OF CAD MALE 1ST DEGREE RELATIVE <50 (ICD-V17.3) HYPERTENSION (ICD-401.9) HYPERLIPIDEMIA (ICD-272.4) GERD (ICD-530.81) DIABETES MELLITUS, TYPE II  (ICD-250.00) CORONARY ARTERY DISEASE (ICD-414.00) OBSERVATION FOR SUSPECTED MALIGNANT NEOPLASM (ICD-V71.1) ROUTINE GENERAL MEDICAL EXAM@HEALTH  CARE FACL (ICD-V70.0)  Past Surgical History: Last updated: 08/07/2007 CORONARY ARTERY BYPASS GRAFT, THREE VESSEL, HX OF (ICD-V45.81)  Review of Systems  The patient denies chest pain, syncope, dyspnea on exertion, and peripheral edema.    Vital Signs:  Patient profile:   65 year old male Height:      71 inches Weight:      239 pounds BMI:     33.45 Pulse rate:   71 / minute BP sitting:   142 / 70  (left arm)  Vitals Entered By: Laurance Flatten CMA (April 06, 2009 9:08 AM)  Physical Exam  General:  Well-developed,well-nourished,in no acute distress; alert,appropriate and cooperative throughout examination Head:  Normocephalic and atraumatic without obvious abnormalities. No apparent alopecia or balding. Eyes:  PERRLA/EOM intact; conjunctiva and lids normal. Mouth:  Oral mucosa and oropharynx without lesions or exudates.  Teeth in good repair. Neck:  No deformities, masses, or tenderness noted. Lungs:  Normal respiratory effort, chest expands symmetrically. Lungs are clear to auscultation, no crackles or wheezes. Heart:  Normal rate and regular rhythm. S1 and S2 normal without gallop, murmur, click, rub or other extra sounds. Abdomen:  Bowel sounds positive,abdomen soft and non-tender without masses, organomegaly or hernias noted. Pulses:  R and L carotid,radial,femoral,dorsalis pedis and posterior tibial pulses are full and equal bilaterally Extremities:  No clubbing, cyanosis, edema, or deformity noted with normal full range of motion of all joints.   Neurologic:  Alert and  oriented x 3.   EKG  Procedure date:  04/06/2009  Findings:      Normal sinus rhythm with rate of:  71   Impression & Recommendations:  Problem # 1:  CORONARY ARTERY BYPASS GRAFT, THREE VESSEL, HX OF (ICD-V45.81) Johnny [name] denies any anginal symptoms today.   I have asked him to continue his current meds and exercise more often.  Problem # 2:  HYPERTENSION (ICD-401.9) His blood pressure is better though still not optimal.  A low sodium diet is recommended. His updated medication list for this problem includes:    Furosemide 40 Mg Tabs (Furosemide) .[name]... 1once daily    Enalapril Maleate 10 Mg Tabs (Enalapril maleate) .[name]... Take 1 tab by mouth daily    Coreg 6.25 Mg Tabs (Carvedilol) .[name]... 1 by mouth bid    Aspirin 325 Mg Tabs (Aspirin) .[name]... Take 1 tab by mouth every day    Doxazosin Mesylate 8 Mg Tabs (Doxazosin mesylate) .[name]... 1 by mouth qd  Problem # 3:  HYPERLIPIDEMIA (ICD-272.4) He will continue his current meds.  A low sodium diet is recommended. His updated medication list for this problem includes:    Lovastatin 20 Mg Tabs (Lovastatin) .[name]... Take 1 tab by mouth daily  Other Orders: EKG w/ Interpretation (93000)  Patient Instructions: 1)  Your physician recommends that you schedule a follow-up appointment in: 12 months with Dr Ladona Ridgel

## 2010-04-06 NOTE — Progress Notes (Signed)
Summary: surg clearance   Phone Note From Other Clinic   Caller: Nurse Summary of Call: Per Pam Dr Annabell Howells ofc pt needs to stop ASA 5 days prior procedure. inplantation of penial prosthesis. ofc [id number] [phone] fax [phone] Initial call taken by: Edman Circle,  January 18, 2010 4:42 PM  Follow-up for Phone Call        ok per Dr Ladona Ridgel to d/c ASA for 5 days prior to procedure  Will fax to Dr Annabell Howells Dennis Bast, RN, BSN  January 19, 2010 2:58 PM

## 2010-04-06 NOTE — Consult Note (Signed)
Summary: [facility]   Imported By: Sherian Rein 12/15/2009 14:01:10  _____________________________________________________________________  External Attachment:    Type:   Image     Comment:   External Document

## 2010-04-06 NOTE — Assessment & Plan Note (Signed)
Summary: 3 mos f/u #/cd   Vital Signs:  Patient profile:   65 year old male Height:      71 inches Weight:      240 pounds BMI:     33.59 O2 Sat:      97 % on Room air Temp:     98.7 degrees F oral Pulse rate:   73 / minute Pulse rhythm:   regular Resp:     16 per minute BP sitting:   136 / 80  (left arm) Cuff size:   regular  Vitals Entered By: Lanier Prude, CMA(AAMA) (October 30, 2009 10:34 AM)  O2 Flow:  Room air CC: 3 mo f/u Is Patient Diabetic? Yes   Primary Care Provider:  Tresa Garter MD  CC:  3 mo f/u.  History of Present Illness: The patient presents for a preventive health examination  Patient past medical history, social history, and family history reviewed in detail no significant changes.  Patient is physically active. Depression is negative and mood is good. Hearing is normal, and able to perform activities of daily living. Risk of falling is negligible and home safety has been reviewed and is appropriate. Patient has normal height, overweight, and visual acuity. Patient has been counseled on age-appropriate routine health concerns for screening and prevention. Education, counseling done.  C/o B hip pain C/o urinary urgency, ED, warts on penis x 2 The patient presents for a follow up of hypertension, diabetes, hyperlipidemia   Current Medications (verified): 1)  Enalapril Maleate 10 Mg  Tabs (Enalapril Maleate) .... Take 1 Tab By Mouth Daily 2)  Ranitidine Hcl 300 Mg Tabs (Ranitidine Hcl) .[name].. 1 Po Qd 3)  Metformin Hcl 1000 Mg Tabs (Metformin Hcl) .... Take 1 Tablet By Mouth Two Times A Day 4)  Lovastatin 20 Mg Tabs (Lovastatin) .... Take 1 Tab By Mouth Daily 5)  Aspirin 325 Mg Tabs (Aspirin) .... Take 1 Tab By Mouth Every Day 6)  Tramadol Hcl 50 Mg  Tabs (Tramadol Hcl) .[name].. 1-2 By Mouth Two Times A Day As Needed Pain 7)  Doxazosin Mesylate 8 Mg Tabs (Doxazosin Mesylate) .[name].. 1 By Mouth Qd 8)  Levitra 20 Mg Tabs (Vardenafil Hcl) .... 1/2-1 Once Daily  As Needed 9)  Glimepiride 4 Mg Tabs (Glimepiride) .[name].. 1 Tablet By Mouth Daily 10)  Pennsaid 1.5 % Soln (Diclofenac Sodium) .... 3-5 Gtt On Skin Three Times A Day For Pain  Allergies (verified): 1)  Hydrocodone-Acetaminophen (Hydrocodone-Acetaminophen)  Past History:  Past Surgical History: Last updated: 08/07/2007 CORONARY ARTERY BYPASS GRAFT, THREE VESSEL, HX OF (ICD-V45.81)  Family History: Last updated: 03/02/2007 Family History of CAD Male 1st degree relative <50 Family History Hypertension  Social History: Last updated: 08/07/2007 Occupation: Programmer, multimedia Married Former Smoker Regular exercise-yes 2009  Past Medical History: BENIGN PROSTATIC HYPERTROPHY, HX OF (ICD-V13.8) Hx of ARTHRITIS (ICD-716.90) BENIGN POSITIONAL VERTIGO, HX OF (ICD-V12.49) ERECTILE DYSFUNCTION (ICD-302.72) ISCHEMIC HEART DISEASE (ICD-414.9) FAMILY HISTORY OF CAD MALE 1ST DEGREE RELATIVE <50 (ICD-V17.3) HYPERTENSION (ICD-401.9) HYPERLIPIDEMIA (ICD-272.4) GERD (ICD-530.81) DIABETES MELLITUS, TYPE II (ICD-250.00) CORONARY ARTERY DISEASE (ICD-414.00) OBSERVATION FOR SUSPECTED MALIGNANT NEOPLASM (ICD-V71.1) ROUTINE GENERAL MEDICAL EXAM@HEALTH  CARE FACL (ICD-V70.0) ED Balanitis  Physical Exam  General:  NAD, well-developed and overweight-appearing.   Head:  Normocephalic and atraumatic without obvious abnormalities. No apparent alopecia or balding. Eyes:  No corneal or conjunctival inflammation noted. EOMI. Perrla. Ears:  External ear exam shows no significant lesions or deformities.  Otoscopic examination reveals clear canals, tympanic membranes are intact bilaterally  without bulging, retraction, inflammation or discharge. Hearing is grossly normal bilaterally. Nose:  External nasal examination shows no deformity or inflammation. Nasal mucosa are pink and moist without lesions or exudates. Mouth:  WNL Neck:  No deformities, masses, or tenderness noted. Chest Gindlesperger:  No deformities, masses,  tenderness or gynecomastia noted. Lungs:  CTA Heart:  Normal rate and regular rhythm. S1 and S2 normal without gallop, murmur, click, rub or other extra sounds. Abdomen:  Bowel sounds positive,abdomen soft and non-tender without masses, organomegaly or hernias noted. Rectal:  per urol/GI Genitalia:  Gegerous foreskin Thick skin on the scrotum 2warts on foreskin No penile d/c  Prostate:  per urol/GI Msk:  Lumbar-sacral spine is less tender to palpation over paraspinal muscles and painfull  in B hips with the ROM  Pulses:  R and L carotid,radial,femoral,dorsalis pedis and posterior tibial pulses are full and equal bilaterally Extremities:  No clubbing, cyanosis, edema, or deformity noted with normal full range of motion of all joints.   Neurologic:  No cranial nerve deficits noted. Station and gait are normal. Plantar reflexes are down-going bilaterally. DTRs are symmetrical throughout. Sensory, motor and coordinative functions appear intact. Skin:  Two penile warts Cervical Nodes:  No lymphadenopathy noted Inguinal Nodes:  No significant adenopathy Psych:  Cognition and judgment appear intact. Alert and cooperative with normal attention span and concentration. No apparent delusions, illusions, hallucinations   Impression & Recommendations:  Problem # 1:  HEALTH MAINTENANCE EXAM (ICD-V70.0) Assessment New Overall doing well, age appropriate education and counseling updated and referral for appropriate preventive services done unless declined, immunizations up to date or declined, diet counseling done if overweight, urged to quit smoking if smokes, most recent labs reviewed and current ordered if appropriate, ecg reviewed or declined (interpretation per ECG scanned in the EMR if done); information regarding Medicare Preventation requirements given if appropriate.  Orders: Gastroenterology Referral (GI) Welcome to Medicare, Physical [phone]) colon Opth due in Sept is pending   Problem # 2:   BALANITIS (ICD-607.1) Assessment: Unchanged  Orders: Urology Referral (Urology) Dr McDearmid  Problem # 3:  BENIGN PROSTATIC HYPERTROPHY, HX OF (ICD-V13.8) Assessment: Unchanged Urol cons  Problem # 4:  DIABETES MELLITUS, TYPE II (ICD-250.00) Assessment: Unchanged  His updated medication list for this problem includes:    Enalapril Maleate 10 Mg Tabs (Enalapril maleate) .[name]... Take 1 tab by mouth daily    Metformin Hcl 1000 Mg Tabs (Metformin hcl) .[name]... Take 1 tablet by mouth two times a day    Aspirin 325 Mg Tabs (Aspirin) .[name]... Take 1 tab by mouth every day    Glimepiride 4 Mg Tabs (Glimepiride) .[name]... 1 tablet by mouth daily  Problem # 5:  WART, VIRAL (ICD-078.10) shaft Assessment: New Procedure: cryo Indication: warts(s) Risks incl. scar(s), incomplete removal, ect.  and benefits discussed     2  lesion(s) on penis was/were treated with liqid N2 in usual fasion.  Tolerated well. Compl. none. Wound care instructions given.   Problem # 6:  HIP PAIN (ICD-719.45) B Assessment: Deteriorated  His updated medication list for this problem includes:    Aspirin 325 Mg Tabs (Aspirin) .[name]... Take 1 tab by mouth every day    Tramadol Hcl 50 Mg Tabs (Tramadol hcl) .[name]... 1-2 by mouth two times a day as needed pain  Orders: Orthopedic Referral (Ortho)  Complete Medication List: 1)  Enalapril Maleate 10 Mg Tabs (Enalapril maleate) .... Take 1 tab by mouth daily 2)  Ranitidine Hcl 300 Mg Tabs (Ranitidine hcl) .Marland KitchenMarland KitchenMarland Kitchen  1 po qd 3)  Metformin Hcl 1000 Mg Tabs (Metformin hcl) .... Take 1 tablet by mouth two times a day 4)  Lovastatin 20 Mg Tabs (Lovastatin) .... Take 1 tab by mouth daily 5)  Aspirin 325 Mg Tabs (Aspirin) .... Take 1 tab by mouth every day 6)  Tramadol Hcl 50 Mg Tabs (Tramadol hcl) .[name].. 1-2 by mouth two times a day as needed pain 7)  Doxazosin Mesylate 8 Mg Tabs (Doxazosin mesylate) .[name].. 1 by mouth qd 8)  Levitra 20 Mg Tabs (Vardenafil hcl) .... 1/2-1 once daily as needed 9)   Glimepiride 4 Mg Tabs (Glimepiride) .[name].. 1 tablet by mouth daily 10)  Pennsaid 1.5 % Soln (Diclofenac sodium) .... 3-5 gtt on skin three times a day for pain  Other Orders: Pneumococcal Vaccine (16109) Admin 1st Vaccine (60454) Flu Vaccine 52yrs + (09811) Administration Flu vaccine - MCR (G0008) Wart Destruct <14 (17110)  Patient Instructions: 1)  Please schedule a follow-up appointment in 4 months. 2)  BMP prior to visit, ICD-9: 3)  HbgA1C prior to visit, ICD-9:250.00   Immunizations Administered:  Pneumonia Vaccine:    Vaccine Type: Pneumovax    Site: right deltoid    Mfr: Merck    Dose: 0.5 ml    Route: IM    Given by: Lanier Prude, CMA(AAMA)    Exp. Date: 03/28/2011    Lot #: [id number]    VIS given: 10/03/95 version given October 30, 2009.  Flu Vaccine Consent Questions     Do you have a history of severe allergic reactions to this vaccine? no    Any prior history of allergic reactions to egg and/or gelatin? no    Do you have a sensitivity to the preservative Thimersol? no    Do you have a past history of Guillan-Barre Syndrome? no    Do you currently have an acute febrile illness? no    Have you ever had a severe reaction to latex? no    Vaccine information given and explained to patient? yes    Are you currently pregnant? no    Lot Number:[id number]   Exp Date:09/04/2010   Site Given  Left Deltoid IM.....Marland KitchenMarland KitchenLanier Prude, [facility])  October 30, 2009 11:44 AM

## 2010-04-06 NOTE — Letter (Signed)
Summary: Pre Visit Letter Revised  [facility]  [address] [place], [place] [address]   Phone: [phone]  Fax: [phone]        11/06/2009 MRN: [id number] [facility] [place] [address] [place], [place]  [address]             Procedure Date:  12-30-09   Welcome to the Gastroenterology Division at [facility].    You are scheduled to see a nurse for your pre-procedure visit on 12-16-09 at 9:00a.m. on the 3rd floor at [facility], [address]  We ask that you try to arrive at our office 15 minutes prior to your appointment time to allow for check-in.  Please take a minute to review the attached form.  If you answer "Yes" to one or more of the questions on the first page, we ask that you call the person listed at your earliest opportunity.  If you answer "No" to all of the questions, please complete the rest of the form and bring it to your appointment.    Your nurse visit will consist of discussing your medical and surgical history, your immediate family medical history, and your medications.   If you are unable to list all of your medications on the form, please bring the medication bottles to your appointment and we will list them.  We will need to be aware of both prescribed and over the counter drugs.  We will need to know exact dosage information as well.    Please be prepared to read and sign documents such as consent forms, a financial agreement, and acknowledgement forms.  If necessary, and with your consent, a friend or relative is welcome to sit-in on the nurse visit with you.  Please bring your insurance card so that we may make a copy of it.  If your insurance requires a referral to see a specialist, please bring your referral form from your primary care physician.  No co-pay is required for this nurse visit.     If you cannot keep your appointment, please call [phone] to cancel or reschedule prior to your appointment date.  This allows  [place] the opportunity to schedule an appointment for another patient in need of care.    Thank you for choosing [facility] for your medical needs.  We appreciate the opportunity to care for you.  Please visit [place] at our website  to learn more about our practice.  Sincerely, The Gastroenterology Division

## 2010-04-06 NOTE — Letter (Signed)
Summary: Patient Notice- Polyp Results  [facility]  [address] [place], [place] [address]   Phone: [phone]  Fax: [phone]        January 03, 2010 MRN: [id number]    [facility] [address] [place], [place]  [address]    Dear [name],  I am pleased to inform you that the colon polyp(s) removed during your recent colonoscopy was (were) found to be benign (no cancer detected) upon pathologic examination.  I recommend you have a repeat colonoscopy examination in 5 years to look for recurrent polyps, as having colon polyps increases your risk for having recurrent polyps or even colon cancer in the future.  Should you develop new or worsening symptoms of abdominal pain, bowel habit changes or bleeding from the rectum or bowels, please schedule an evaluation with either your primary care physician or with me.  Continue treatment plan as outlined the day of your exam.  Please call us if you are having persistent problems or have questions about your condition that have not been fully answered at this time.  Sincerely,  Meryl Dare MD [facility]  This letter has been electronically signed by your physician.  Appended Document: Patient Notice- Polyp Results letter mailed

## 2010-04-06 NOTE — Assessment & Plan Note (Signed)
Summary: DRAINAGE PENIS/OK TRIAGE TO PUT ON/WILL ONLY SEE PLOT/CD   Vital Signs:  Patient profile:   65 year old male Weight:      240 pounds Temp:     98.3 degrees F oral Pulse rate:   74 / minute BP sitting:   144 / 80  (left arm)  Vitals Entered By: Tora Perches (May 25, 2009 8:14 AM) CC: penis drainage Is Patient Diabetic? Yes   Primary Care Provider:  Tresa Garter MD  CC:  penis drainage.  History of Present Illness: C/o leaking around scrotum, urinating a lot. Creams,antibiotic did not help.  Preventive Screening-Counseling & Management  Alcohol-Tobacco     Smoking Status: quit  Current Medications (verified): 1)  Furosemide 40 Mg Tabs (Furosemide) .[name].. 1once Daily 2)  Enalapril Maleate 10 Mg  Tabs (Enalapril Maleate) .... Take 1 Tab By Mouth Daily 3)  Ranitidine Hcl 300 Mg Tabs (Ranitidine Hcl) .[name].. 1 Po Qd 4)  Metformin Hcl 1000 Mg Tabs (Metformin Hcl) .... Take 1 Tablet By Mouth Two Times A Day 5)  Vitamin D3 1000 Unit  Tabs (Cholecalciferol) .[name].. 1 Qd 6)  Lovastatin 20 Mg Tabs (Lovastatin) .... Take 1 Tab By Mouth Daily 7)  Coreg 6.25 Mg Tabs (Carvedilol) .[name].. 1 By Mouth Bid 8)  Aspirin 325 Mg Tabs (Aspirin) .... Take 1 Tab By Mouth Every Day 9)  Tramadol Hcl 50 Mg  Tabs (Tramadol Hcl) .[name].. 1-2 By Mouth Two Times A Day As Needed Pain 10)  Doxazosin Mesylate 8 Mg Tabs (Doxazosin Mesylate) .[name].. 1 By Mouth Qd 11)  Levitra 20 Mg Tabs (Vardenafil Hcl) .... 1/2-1 Once Daily As Needed 12)  Glimepiride 4 Mg Tabs (Glimepiride) .[name].. 1 Tablet By Mouth Daily 13)  Promethazine-Codeine 6.25-10 Mg/76ml Syrp (Promethazine-Codeine) .... 5-10 Ml By Mouth Q Id As Needed Cough 14)  Doxycycline Hyclate 100 Mg Caps (Doxycycline Hyclate) .[name].. 1 Cap 2 X A Day  Allergies: 1)  Hydrocodone-Acetaminophen (Hydrocodone-Acetaminophen)  Physical Exam  General:  Well-developed,well-nourished,in no acute distress; alert,appropriate and cooperative throughout examination Abdomen:   Bowel sounds positive,abdomen soft and non-tender without masses, organomegaly or hernias noted. Genitalia:  Gegerous foreskin Thick skin on the scrotum 2-3 warts on foreskin No penile d/c  Psych:  Cognition and judgment appear intact. Alert and cooperative with normal attention span and concentration. No apparent delusions, illusions, hallucinations   Impression & Recommendations:  Problem # 1:  BALANITIS (ICD-607.1) Assessment Unchanged Doxy and Nystatin did not help Orders: Urology Referral (Urology) Dr Brunilda Payor  Problem # 2:  ERECTILE DYSFUNCTION (ICD-302.72) Assessment: Unchanged  His updated medication list for this problem includes:    Levitra 20 Mg Tabs (Vardenafil hcl) .[name]... 1/2-1 once daily as needed  Complete Medication List: 1)  Furosemide 40 Mg Tabs (Furosemide) .[name].. 1once daily 2)  Enalapril Maleate 10 Mg Tabs (Enalapril maleate) .... Take 1 tab by mouth daily 3)  Ranitidine Hcl 300 Mg Tabs (Ranitidine hcl) .[name].. 1 po qd 4)  Metformin Hcl 1000 Mg Tabs (Metformin hcl) .... Take 1 tablet by mouth two times a day 5)  Vitamin D3 1000 Unit Tabs (Cholecalciferol) .[name].. 1 qd 6)  Lovastatin 20 Mg Tabs (Lovastatin) .... Take 1 tab by mouth daily 7)  Coreg 6.25 Mg Tabs (Carvedilol) .[name].. 1 by mouth bid 8)  Aspirin 325 Mg Tabs (Aspirin) .... Take 1 tab by mouth every day 9)  Tramadol Hcl 50 Mg Tabs (Tramadol hcl) .[name].. 1-2 by mouth two times a day as needed pain 10)  Doxazosin Mesylate 8 Mg Tabs (Doxazosin mesylate) .[name].. 1 by mouth qd 11)  Levitra 20 Mg Tabs (Vardenafil hcl) .... 1/2-1 once daily as needed 12)  Glimepiride 4 Mg Tabs (Glimepiride) .[name].. 1 tablet by mouth daily 13)  Promethazine-codeine 6.25-10 Mg/34ml Syrp (Promethazine-codeine) .... 5-10 ml by mouth q id as needed cough 14)  Doxycycline Hyclate 100 Mg Caps (Doxycycline hyclate) .[name].. 1 cap 2 x a day  Patient Instructions: 1)  Call if you are not better in a reasonable amount of time or if worse.

## 2010-04-06 NOTE — Assessment & Plan Note (Signed)
Summary: PER SPOUSE PT HAS DRAINAGE FROM PENIS-LB   Vital Signs:  Patient profile:   65 year old male Weight:      241 pounds Temp:     99.5 degrees F oral Pulse rate:   81 / minute BP sitting:   146 / 76  (left arm)  Vitals Entered By: Tora Perches (April 23, 2009 8:58 AM) CC: D/C FROM [facility] Is Patient Diabetic? Yes   Primary Care Provider:  Tresa Garter MD  CC:  D/C FROM [facility].  History of Present Illness: C/o leakage around foreskin and itching The patient presents with complaints of sore throat, fever, cough, sinus congestion and drainge of several days duration. Not better with OTC meds. Chest hurts with coughing. Can't sleep due to cough. Muscle aches are not  present.  The mucus is colored.   Preventive Screening-Counseling & Management  Alcohol-Tobacco     Smoking Status: quit  Current Medications (verified): 1)  Furosemide 40 Mg Tabs (Furosemide) .[name].. 1once Daily 2)  Enalapril Maleate 10 Mg  Tabs (Enalapril Maleate) .... Take 1 Tab By Mouth Daily 3)  Ranitidine Hcl 300 Mg Tabs (Ranitidine Hcl) .[name].. 1 Po Qd 4)  Metformin Hcl 1000 Mg Tabs (Metformin Hcl) .... Take 1 Tablet By Mouth Two Times A Day 5)  Vitamin D3 1000 Unit  Tabs (Cholecalciferol) .[name].. 1 Qd 6)  Lovastatin 20 Mg Tabs (Lovastatin) .... Take 1 Tab By Mouth Daily 7)  Coreg 6.25 Mg Tabs (Carvedilol) .[name].. 1 By Mouth Bid 8)  Aspirin 325 Mg Tabs (Aspirin) .... Take 1 Tab By Mouth Every Day 9)  Tramadol Hcl 50 Mg  Tabs (Tramadol Hcl) .[name].. 1-2 By Mouth Two Times A Day As Needed Pain 10)  Doxazosin Mesylate 8 Mg Tabs (Doxazosin Mesylate) .[name].. 1 By Mouth Qd 11)  Levitra 20 Mg Tabs (Vardenafil Hcl) .... 1/2-1 Once Daily As Needed 12)  Glimepiride 4 Mg Tabs (Glimepiride) .[name].. 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) FAMILY HISTORY OF CAD MALE 1ST DEGREE RELATIVE <50 (ICD-V17.3) HYPERTENSION (ICD-401.9) HYPERLIPIDEMIA (ICD-272.4) GERD (ICD-530.81) DIABETES MELLITUS, TYPE II (ICD-250.00) CORONARY ARTERY DISEASE (ICD-414.00) OBSERVATION FOR SUSPECTED MALIGNANT NEOPLASM (ICD-V71.1) ROUTINE GENERAL MEDICAL EXAM@HEALTH  CARE FACL (ICD-V70.0)  Social History: Last updated: 08/07/2007 Occupation: Programmer, multimedia Married Former Smoker Regular exercise-yes 2009  Physical Exam  General:  Well-developed,well-nourished,in no acute distress; alert,appropriate and cooperative throughout examination Mouth:  Erythematous throat mucosa and intranasal erythema.  Lungs:  CTA Heart:  Normal rate and regular rhythm. S1 and S2 normal without gallop, murmur, click, rub or other extra sounds. Genitalia:  foreskin swollen with inflamation over glance penis  3 warts present on shaft Skin:  mild intertrigo   Impression & Recommendations:  Problem # 1:  BRONCHITIS, ACUTE (ICD-466.0) Assessment New  His updated medication list for this problem includes:    Doxycycline Monohydrate 100 Mg Caps (Doxycycline monohydrate) .[name]... 1 by mouth two times a day with a glass of water    Promethazine-codeine 6.25-10 Mg/30ml Syrp (Promethazine-codeine) .[name]... 5-10 ml by mouth q id as needed cough  Problem # 2:  BALANITIS (ICD-607.1) Assessment: New Rx cream  Complete Medication List: 1)  Furosemide 40 Mg Tabs (Furosemide) .[name].. 1once daily 2)  Enalapril Maleate 10 Mg Tabs (Enalapril maleate) .... Take 1 tab by mouth  daily 3)  Ranitidine Hcl 300 Mg Tabs (Ranitidine hcl) .[name].. 1 po qd 4)  Metformin Hcl 1000 Mg Tabs (Metformin hcl) .... Take 1 tablet by mouth two times a day 5)  Vitamin D3 1000 Unit Tabs (Cholecalciferol) .[name].. 1 qd 6)  Lovastatin 20 Mg Tabs (Lovastatin) .... Take 1 tab by mouth daily 7)  Coreg  6.25 Mg Tabs (Carvedilol) .[name].. 1 by mouth bid 8)  Aspirin 325 Mg Tabs (Aspirin) .... Take 1 tab by mouth every day 9)  Tramadol Hcl 50 Mg Tabs (Tramadol hcl) .[name].. 1-2 by mouth two times a day as needed pain 10)  Doxazosin Mesylate 8 Mg Tabs (Doxazosin mesylate) .[name].. 1 by mouth qd 11)  Levitra 20 Mg Tabs (Vardenafil hcl) .... 1/2-1 once daily as needed 12)  Glimepiride 4 Mg Tabs (Glimepiride) .[name].. 1 tablet by mouth daily 13)  Doxycycline Monohydrate 100 Mg Caps (Doxycycline monohydrate) .[name].. 1 by mouth two times a day with a glass of water 14)  Ketoconazole 2 % Crea (Ketoconazole) .... Use bid 15)  Promethazine-codeine 6.25-10 Mg/96ml Syrp (Promethazine-codeine) .... 5-10 ml by mouth q id as needed cough  Patient Instructions: 1)  Call if you are not better in a reasonable amount of time or if worse.  2)  Please schedule a follow-up appointment in 2 months. Prescriptions: DOXYCYCLINE MONOHYDRATE 100 MG CAPS (DOXYCYCLINE MONOHYDRATE) 1 by mouth two times a day with a glass of water  #20 x 0   Entered and Authorized by:   Tresa Garter MD   Signed by:   Tresa Garter MD on 04/23/2009   Method used:   Electronically to        [facility] [phone]* (retail)       [address]       [place], [place]  [address]       Ph: [phone]       Fax: [phone]   RxID:   [id number] KETOCONAZOLE 2 % CREA (KETOCONAZOLE) use bid  #90 g x 3   Entered and Authorized by:   Tresa Garter MD   Signed by:   Tresa Garter MD on 04/23/2009   Method used:   Electronically to        [facility] [phone]* (retail)       [address]       [place], [place]  [address]       Ph: [phone]       Fax: [phone]   RxID:   [id number] PROMETHAZINE-CODEINE 6.25-10 MG/5ML SYRP (PROMETHAZINE-CODEINE) 5-10 ml by mouth q id as needed cough  #300 ml x 0   Entered and Authorized by:   Tresa Garter MD   Signed by:   Tresa Garter MD on 04/23/2009   Method  used:   Print then Give to Patient   RxID:   [id number] KETOCONAZOLE 2 % CREA (KETOCONAZOLE) use bid  #90 g x 3   Entered and Authorized by:   Tresa Garter MD   Signed by:   Tresa Garter MD on 04/23/2009   Method used:   Electronically to        [facility] [phone]* (retail)       [address]       [place], [place]  [address]       Ph: [phone]       Fax: [phone]  RxID:   [id number] DOXYCYCLINE MONOHYDRATE 100 MG CAPS (DOXYCYCLINE MONOHYDRATE) 1 by mouth two times a day with a glass of water  #20 x 0   Entered and Authorized by:   Tresa Garter MD   Signed by:   Tresa Garter MD on 04/23/2009   Method used:   Electronically to        [facility]  [address] [phone]* (retail)       [address]       [place], [place]  [address]       Ph: [phone]       Fax: [phone]   RxID:   [id number]

## 2010-04-06 NOTE — Progress Notes (Signed)
Summary: Records Request   Faxed EKG & Stress to [facility] at [facility] ([phone]). Debby Freiberg  February 02, 2010 11:40 AM

## 2010-04-06 NOTE — Progress Notes (Signed)
Summary: REFILL - ABX  Phone Note Call from Patient   Caller: [phone] Angie - Wife Summary of Call: Patient is requesting refill on doxycycline. C/o same "condition" as last office visit.  Initial call taken by: Lamar Sprinkles, CMA,  May 08, 2009 11:04 AM  Follow-up for Phone Call        ok to ref Follow-up by: Tresa Garter MD,  May 08, 2009 1:19 PM    New/Updated Medications: DOXYCYCLINE HYCLATE 100 MG CAPS (DOXYCYCLINE HYCLATE) 1 cap 2 x a day Prescriptions: DOXYCYCLINE HYCLATE 100 MG CAPS (DOXYCYCLINE HYCLATE) 1 cap 2 x a day  #20 x 0   Entered by:   Ami Bullins CMA   Authorized by:   Tresa Garter MD   Signed by:   Bill Salinas CMA on 05/08/2009   Method used:   Electronically to        [facility]  [address] [id number] (retail)       [address]       [place], [place]  [address]       Ph: [phone]       Fax: [phone]   RxID:   [id number]

## 2010-04-06 NOTE — Letter (Signed)
Summary: [facility]   Imported By: Lennie Odor 02/09/2010 11:59:33  _____________________________________________________________________  External Attachment:    Type:   Image     Comment:   External Document

## 2010-04-08 NOTE — Assessment & Plan Note (Signed)
Summary: PER CHECK OUT/SF    Visit Type:  Follow-up Primary Provider:  Tresa Garter MD   History of Present Illness: Johnny [name] returns today for followup.  He is a pleasant 65 yo man with a h/o CAD, HTN, DM, and dyslipidemia.  He is exercising 3 times a week. He denies c/p, sob or peripheral edema.  His blood pressure has been high in the past but he states that when he was last checked by Dr. Posey [name], it was under 130.  He has no other complaints.  Current Medications (verified): 1)  Enalapril Maleate 10 Mg  Tabs (Enalapril Maleate) .... Take 1 Tab By Mouth Daily 2)  Metformin Hcl 1000 Mg Tabs (Metformin Hcl) .... Take 1 Tablet By Mouth Two Times A Day 3)  Lovastatin 20 Mg Tabs (Lovastatin) .... Take 1 Tab By Mouth Daily 4)  Aspirin 325 Mg Tabs (Aspirin) .... Take 1 Tab By Mouth Every Day 5)  Tramadol Hcl 50 Mg  Tabs (Tramadol Hcl) .[name].. 1-2 By Mouth Two Times A Day As Needed Pain 6)  Doxazosin Mesylate 8 Mg Tabs (Doxazosin Mesylate) .[name].. 1 By Mouth Qd 7)  Glimepiride 4 Mg Tabs (Glimepiride) .[name].. 1 Tablet By Mouth Daily  Allergies: 1)  Hydrocodone-Acetaminophen (Hydrocodone-Acetaminophen)  Past History:  Past Medical History: Last updated: 10/30/2009 BENIGN PROSTATIC HYPERTROPHY, HX OF (ICD-V13.8) Hx of ARTHRITIS (ICD-716.90) BENIGN POSITIONAL VERTIGO, HX OF (ICD-V12.49) ERECTILE DYSFUNCTION (ICD-302.72) ISCHEMIC HEART DISEASE (ICD-414.9) FAMILY HISTORY OF CAD MALE 1ST DEGREE RELATIVE <50 (ICD-V17.3) HYPERTENSION (ICD-401.9) HYPERLIPIDEMIA (ICD-272.4) GERD (ICD-530.81) DIABETES MELLITUS, TYPE II (ICD-250.00) CORONARY ARTERY DISEASE (ICD-414.00) OBSERVATION FOR SUSPECTED MALIGNANT NEOPLASM (ICD-V71.1) ROUTINE GENERAL MEDICAL EXAM@HEALTH  CARE FACL (ICD-V70.0) ED Balanitis  Past Surgical History: Last updated: 03/05/2010 CORONARY ARTERY BYPASS GRAFT, THREE VESSEL, 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    Vital Signs:  Patient profile:   65 year old male Height:      71 inches Weight:      239 pounds BMI:     33.45 Pulse rate:   57 / minute BP sitting:   160 / 74  (left arm)  Vitals Entered By: Laurance Flatten CMA (March 16, 2010 11:00 AM)  Physical Exam  General:  NAD, well-developed and overweight-appearing.   Head:  Normocephalic and atraumatic without obvious abnormalities. No apparent alopecia or balding. Eyes:  No corneal or conjunctival inflammation noted. EOMI. Perrla. Mouth:  WNL Neck:  No deformities, masses, or tenderness noted. Chest Reale:  No deformities, masses, tenderness or gynecomastia noted. Lungs:  CTA Heart:  Normal rate and regular rhythm. S1 and S2 normal without gallop, murmur, click, rub or other extra sounds. Abdomen:  Bowel sounds positive,abdomen soft and non-tender without masses, organomegaly or hernias noted. Msk:  Lumbar-sacral spine is less tender to palpation over paraspinal muscles and painfull  in B hips with the ROM  Pulses:  R and L carotid,radial,femoral,dorsalis pedis and posterior tibial pulses are full and equal bilaterally Extremities:  No clubbing, cyanosis, edema, or deformity noted with normal full range of motion of all joints.   Neurologic:  No cranial nerve deficits noted. Station and gait are normal. Plantar reflexes are down-going bilaterally. DTRs are symmetrical throughout. Sensory, motor and coordinative functions appear intact.   Impression & Recommendations:  Problem # 1:  ISCHEMIC HEART DISEASE (ICD-414.9) Johnny [name] is stable. I have recommended he continue his  current meds, maintain an exercise program and followup with me in 2 yrs. His updated medication list for this problem includes:    Enalapril Maleate 10 Mg Tabs (Enalapril maleate) .[name]... Take 1 tab by mouth daily    Aspirin 325 Mg Tabs (Aspirin) .[name]... Take 1 tab by mouth every  day  Problem # 2:  HYPERTENSION (ICD-401.9) His blood pressure is minimally elevated. I have asked him to maintain a low sodium diet. He will continue his current meds. His updated medication list for this problem includes:    Enalapril Maleate 10 Mg Tabs (Enalapril maleate) .[name]... Take 1 tab by mouth daily    Aspirin 325 Mg Tabs (Aspirin) .[name]... Take 1 tab by mouth every day    Doxazosin Mesylate 8 Mg Tabs (Doxazosin mesylate) .[name]... 1 by mouth qd  Problem # 3:  HYPERLIPIDEMIA (ICD-272.4) He will maintain a low fat diet and statin therapy. His updated medication list for this problem includes:    Lovastatin 20 Mg Tabs (Lovastatin) .[name]... Take 1 tab by mouth daily  Patient Instructions: 1)  Your physician wants you to follow-up in: 2 years with Dr Johnny [name] will receive a reminder letter in the mail two months in advance. If you don't receive a letter, please call our office to schedule the follow-up appointment.

## 2010-04-08 NOTE — Assessment & Plan Note (Signed)
Summary: 4 mos f/u / # / cd   Vital Signs:  Patient profile:   65 year old male Height:      71 inches Weight:      242 pounds BMI:     33.87 Temp:     98.6 degrees F oral Pulse rate:   84 / minute Pulse rhythm:   regular Resp:     16 per minute BP sitting:   128 / 76  (left arm) Cuff size:   large  Vitals Entered By: Lanier Prude, CMA(AAMA) (March 05, 2010 11:15 AM) CC: 86mo f/u Is Patient Diabetic? Yes Comments pt is not taking Ranitidine, Levitra or Pennsaid   Primary Care Provider:  Tresa Garter MD  CC:  86mo f/u.  History of Present Illness: The patient presents for a follow up of hypertension, diabetes, hyperlipidemia, ED   Current Medications (verified): 1)  Enalapril Maleate 10 Mg  Tabs (Enalapril Maleate) .... Take 1 Tab By Mouth Daily 2)  Ranitidine Hcl 300 Mg Tabs (Ranitidine Hcl) .[name].. 1 Po Qd 3)  Metformin Hcl 1000 Mg Tabs (Metformin Hcl) .... Take 1 Tablet By Mouth Two Times A Day 4)  Lovastatin 20 Mg Tabs (Lovastatin) .... Take 1 Tab By Mouth Daily 5)  Aspirin 325 Mg Tabs (Aspirin) .... Take 1 Tab By Mouth Every Day 6)  Tramadol Hcl 50 Mg  Tabs (Tramadol Hcl) .[name].. 1-2 By Mouth Two Times A Day As Needed Pain 7)  Doxazosin Mesylate 8 Mg Tabs (Doxazosin Mesylate) .[name].. 1 By Mouth Qd 8)  Levitra 20 Mg Tabs (Vardenafil Hcl) .... 1/2-1 Once Daily As Needed 9)  Glimepiride 4 Mg Tabs (Glimepiride) .[name].. 1 Tablet By Mouth Daily 10)  Pennsaid 1.5 % Soln (Diclofenac Sodium) .... 3-5 Gtt On Skin Three Times A Day For Pain  Allergies (verified): 1)  Hydrocodone-Acetaminophen (Hydrocodone-Acetaminophen)  Past History:  Past Medical History: Last updated: 10/30/2009 BENIGN PROSTATIC HYPERTROPHY, HX OF (ICD-V13.8) Hx of ARTHRITIS (ICD-716.90) BENIGN POSITIONAL VERTIGO, HX OF (ICD-V12.49) ERECTILE DYSFUNCTION (ICD-302.72) ISCHEMIC HEART DISEASE (ICD-414.9) FAMILY HISTORY OF CAD MALE 1ST DEGREE RELATIVE <50 (ICD-V17.3) HYPERTENSION (ICD-401.9) HYPERLIPIDEMIA  (ICD-272.4) GERD (ICD-530.81) DIABETES MELLITUS, TYPE II (ICD-250.00) CORONARY ARTERY DISEASE (ICD-414.00) OBSERVATION FOR SUSPECTED MALIGNANT NEOPLASM (ICD-V71.1) ROUTINE GENERAL MEDICAL EXAM@HEALTH  CARE FACL (ICD-V70.0) ED Balanitis  Social History: Last updated: 08/07/2007 Occupation: Programmer, multimedia Married Former Smoker Regular exercise-yes 2009  Past Surgical History: CORONARY ARTERY BYPASS GRAFT, THREE VESSEL, HX OF (ICD-V45.81) Penile prosthesis 2011 Dr Vernie Ammons Cataract extraction R 2011  Review of Systems  The patient denies fever, dyspnea on exertion, and abdominal pain.    Physical Exam  General:  NAD, well-developed and overweight-appearing.   Eyes:  No corneal or conjunctival inflammation noted. EOMI. Perrla. Mouth:  WNL Lungs:  CTA Heart:  Normal rate and regular rhythm. S1 and S2 normal without gallop, murmur, click, rub or other extra sounds. Abdomen:  Bowel sounds positive,abdomen soft and non-tender without masses, organomegaly or hernias noted. Pulses:  R and L carotid,radial,femoral,dorsalis pedis and posterior tibial pulses are full and equal bilaterally Neurologic:  No cranial nerve deficits noted. Station and gait are normal. Plantar reflexes are down-going bilaterally. DTRs are symmetrical throughout. Sensory, motor and coordinative functions appear intact. Skin:  Two penile warts Psych:  Cognition and judgment appear intact. Alert and cooperative with normal attention span and concentration. No apparent delusions, illusions, hallucinations   Impression & Recommendations:  Problem # 1:  ISCHEMIC HEART DISEASE (ICD-414.9) Assessment Unchanged  His updated medication list for this  problem includes:    Enalapril Maleate 10 Mg Tabs (Enalapril maleate) .[name]... Take 1 tab by mouth daily    Aspirin 325 Mg Tabs (Aspirin) .[name]... Take 1 tab by mouth every day    Doxazosin Mesylate 8 Mg Tabs (Doxazosin mesylate) .[name]... 1 by mouth qd  Problem # 2:  GERD  (ICD-530.81) Assessment: Unchanged  His updated medication list for this problem includes:    Ranitidine Hcl 300 Mg Tabs (Ranitidine hcl) .[name]... 1 po qd  Problem # 3:  ERECTILE DYSFUNCTION (ICD-302.72) Assessment: Improved S/p implant His updated medication list for this problem includes:    Levitra 20 Mg Tabs (Vardenafil hcl) .[name]... 1/2-1 once daily as needed  Problem # 4:  DIABETES MELLITUS, TYPE II (ICD-250.00) Assessment: Unchanged  His updated medication list for this problem includes:    Enalapril Maleate 10 Mg Tabs (Enalapril maleate) .[name]... Take 1 tab by mouth daily    Metformin Hcl 1000 Mg Tabs (Metformin hcl) .[name]... Take 1 tablet by mouth two times a day    Aspirin 325 Mg Tabs (Aspirin) .[name]... Take 1 tab by mouth every day    Glimepiride 4 Mg Tabs (Glimepiride) .[name]... 1 tablet by mouth daily  Orders: TLB-BMP (Basic Metabolic Panel-BMET) (80048-METABOL) TLB-Hepatic/Liver Function Pnl (80076-HEPATIC) TLB-A1C / Hgb A1C (Glycohemoglobin) (83036-A1C)  Problem # 5:  HYPERLIPIDEMIA (ICD-272.4) Assessment: Improved  His updated medication list for this problem includes:    Lovastatin 20 Mg Tabs (Lovastatin) .[name]... Take 1 tab by mouth daily  Orders: TLB-BMP (Basic Metabolic Panel-BMET) (80048-METABOL) TLB-Hepatic/Liver Function Pnl (80076-HEPATIC) TLB-A1C / Hgb A1C (Glycohemoglobin) (83036-A1C)  Complete Medication List: 1)  Enalapril Maleate 10 Mg Tabs (Enalapril maleate) .... Take 1 tab by mouth daily 2)  Ranitidine Hcl 300 Mg Tabs (Ranitidine hcl) .[name].. 1 po qd 3)  Metformin Hcl 1000 Mg Tabs (Metformin hcl) .... Take 1 tablet by mouth two times a day 4)  Lovastatin 20 Mg Tabs (Lovastatin) .... Take 1 tab by mouth daily 5)  Aspirin 325 Mg Tabs (Aspirin) .... Take 1 tab by mouth every day 6)  Tramadol Hcl 50 Mg Tabs (Tramadol hcl) .[name].. 1-2 by mouth two times a day as needed pain 7)  Doxazosin Mesylate 8 Mg Tabs (Doxazosin mesylate) .[name].. 1 by mouth qd 8)  Levitra 20 Mg Tabs  (Vardenafil hcl) .... 1/2-1 once daily as needed 9)  Glimepiride 4 Mg Tabs (Glimepiride) .[name].. 1 tablet by mouth daily 10)  Pennsaid 1.5 % Soln (Diclofenac sodium) .... 3-5 gtt on skin three times a day for pain  Patient Instructions: 1)  Please schedule a follow-up appointment in 4 months  2)  HbgA1C prior to visit, ICD-9:250.00 3)  BMP prior to visit, ICD-9:   Orders Added: 1)  TLB-BMP (Basic Metabolic Panel-BMET) [80048-METABOL] 2)  TLB-Hepatic/Liver Function Pnl [80076-HEPATIC] 3)  TLB-A1C / Hgb A1C (Glycohemoglobin) [83036-A1C] 4)  Est. Patient Level IV [16109]

## 2010-04-08 NOTE — Letter (Signed)
[facility]   Imported By: Sherian Rein 02/25/2010 09:24:08  _____________________________________________________________________  External Attachment:    Type:   Image     Comment:   External Document

## 2010-04-14 NOTE — Letter (Signed)
Summary: [facility]   Imported By: Sherian Rein 04/08/2010 11:34:42  _____________________________________________________________________  External Attachment:    Type:   Image     Comment:   External Document

## 2010-05-18 LAB — GLUCOSE, CAPILLARY
Glucose-Capillary: 117 mg/dL — ABNORMAL HIGH (ref 70–99)
Glucose-Capillary: 125 mg/dL — ABNORMAL HIGH (ref 70–99)
Glucose-Capillary: 127 mg/dL — ABNORMAL HIGH (ref 70–99)
Glucose-Capillary: 201 mg/dL — ABNORMAL HIGH (ref 70–99)

## 2010-05-18 LAB — POCT I-STAT 4, (NA,K, GLUC, HGB,HCT)
Glucose, Bld: 149 mg/dL — ABNORMAL HIGH (ref 70–99)
HCT: 38 % — ABNORMAL LOW (ref 39.0–52.0)
Hemoglobin: 12.9 g/dL — ABNORMAL LOW (ref 13.0–17.0)
Potassium: 4 mEq/L (ref 3.5–5.1)
Sodium: 141 mEq/L (ref 135–145)

## 2010-05-19 LAB — GLUCOSE, CAPILLARY: Glucose-Capillary: 110 mg/dL — ABNORMAL HIGH (ref 70–99)

## 2010-06-18 ENCOUNTER — Other Ambulatory Visit: Payer: Self-pay | Admitting: Internal Medicine

## 2010-06-18 ENCOUNTER — Other Ambulatory Visit: Payer: Self-pay

## 2010-06-18 DIAGNOSIS — E119 Type 2 diabetes mellitus without complications: Secondary | ICD-10-CM

## 2010-06-23 ENCOUNTER — Emergency Department (HOSPITAL_COMMUNITY): Payer: Medicare Other

## 2010-06-23 ENCOUNTER — Emergency Department (HOSPITAL_COMMUNITY)
Admission: EM | Admit: 2010-06-23 | Discharge: 2010-06-23 | Disposition: A | Discharge status: Home / Self Care | Payer: Medicare Other | Attending: Emergency Medicine | Admitting: Emergency Medicine

## 2010-06-23 DIAGNOSIS — M25559 Pain in unspecified hip: Secondary | ICD-10-CM | POA: Insufficient documentation

## 2010-06-23 DIAGNOSIS — I10 Essential (primary) hypertension: Secondary | ICD-10-CM | POA: Insufficient documentation

## 2010-06-23 DIAGNOSIS — I2510 Atherosclerotic heart disease of native coronary artery without angina pectoris: Secondary | ICD-10-CM | POA: Insufficient documentation

## 2010-06-23 DIAGNOSIS — E785 Hyperlipidemia, unspecified: Secondary | ICD-10-CM | POA: Insufficient documentation

## 2010-06-23 DIAGNOSIS — Z951 Presence of aortocoronary bypass graft: Secondary | ICD-10-CM | POA: Insufficient documentation

## 2010-06-23 DIAGNOSIS — M545 Low back pain, unspecified: Secondary | ICD-10-CM | POA: Insufficient documentation

## 2010-06-23 DIAGNOSIS — M79609 Pain in unspecified limb: Secondary | ICD-10-CM | POA: Insufficient documentation

## 2010-06-25 ENCOUNTER — Ambulatory Visit: Payer: Self-pay | Admitting: Internal Medicine

## 2010-07-12 ENCOUNTER — Encounter: Payer: Self-pay | Admitting: Internal Medicine

## 2010-07-12 ENCOUNTER — Telehealth: Payer: Self-pay | Admitting: Internal Medicine

## 2010-07-12 ENCOUNTER — Ambulatory Visit (INDEPENDENT_AMBULATORY_CARE_PROVIDER_SITE_OTHER): Payer: Medicare Other | Admitting: Internal Medicine

## 2010-07-12 ENCOUNTER — Other Ambulatory Visit (INDEPENDENT_AMBULATORY_CARE_PROVIDER_SITE_OTHER): Payer: Medicare Other

## 2010-07-12 DIAGNOSIS — R0609 Other forms of dyspnea: Secondary | ICD-10-CM

## 2010-07-12 DIAGNOSIS — E119 Type 2 diabetes mellitus without complications: Secondary | ICD-10-CM

## 2010-07-12 DIAGNOSIS — I2510 Atherosclerotic heart disease of native coronary artery without angina pectoris: Secondary | ICD-10-CM

## 2010-07-12 DIAGNOSIS — Z951 Presence of aortocoronary bypass graft: Secondary | ICD-10-CM

## 2010-07-12 LAB — BASIC METABOLIC PANEL
GFR: 140.58 mL/min (ref >60.00)
Potassium: 4.7 mEq/L (ref 3.5–5.1)
Sodium: 139 mEq/L (ref 135–145)

## 2010-07-12 LAB — BRAIN NATRIURETIC PEPTIDE: Pro B Natriuretic peptide (BNP): 28 pg/mL (ref 0.0–100.0)

## 2010-07-12 LAB — COMPREHENSIVE METABOLIC PANEL
AST: 16 U/L (ref 0–37)
Albumin: 3.8 g/dL (ref 3.5–5.2)
Alkaline Phosphatase: 91 U/L (ref 39–117)
BUN: 11 mg/dL (ref 6–23)
Potassium: 4.7 mEq/L (ref 3.5–5.1)
Sodium: 139 mEq/L (ref 135–145)
Total Protein: 7.1 g/dL (ref 6.0–8.3)

## 2010-07-12 LAB — CBC WITH DIFFERENTIAL/PLATELET
Basophils Relative: 1.8 % (ref 0.0–3.0)
Eosinophils Absolute: 0.1 10*3/uL (ref 0.0–0.7)
Lymphs Abs: 1.6 10*3/uL (ref 0.7–4.0)
MCHC: 34 g/dL (ref 30.0–36.0)
MCV: 83.8 fl (ref 78.0–100.0)
Monocytes Absolute: 0.5 10*3/uL (ref 0.1–1.0)
Neutrophils Relative %: 55.9 % (ref 43.0–77.0)
RBC: 4.38 Mil/uL (ref 4.22–5.81)

## 2010-07-12 NOTE — Assessment & Plan Note (Signed)
It was a new episode during sex. The exertion was a lot more vigorous comp. to other times We will get a stress test.

## 2010-07-12 NOTE — Assessment & Plan Note (Signed)
On Rx 

## 2010-07-12 NOTE — Assessment & Plan Note (Signed)
On Rx. No angina.

## 2010-07-12 NOTE — Telephone Encounter (Signed)
Johnny [name], please, inform patient that all labs are ok, except for poor diabetes control. We need to go on insulin . Is it ok to sch appt w/an Endocrinologist?

## 2010-07-12 NOTE — Progress Notes (Signed)
  Subjective:    Patient ID: Johnny [name], male    DOB: September 19, 1945, 65 y.o.   MRN: [id number]  Shortness of Breath This is a new problem. The problem has been resolved. Pertinent negatives include no chest pain, leg swelling or vomiting. Hemoptysis: 5.  It happened a few days ago.  The patient presents for a follow-up of  chronic hypertension, chronic dyslipidemia, type 2 diabetes controlled with medicines. He was SOB while having sex the other day... No CP. He has been exercising. He is not taking Coreg.    Review of Systems  Constitutional: Negative for fatigue.  HENT: Negative for congestion.   Eyes: Negative for pain.  Respiratory: Positive for shortness of breath (just one event). Hemoptysis: 5.   Cardiovascular: Negative for chest pain, palpitations and leg swelling.  Gastrointestinal: Negative for vomiting.  Genitourinary: Negative for dysuria.  Musculoskeletal: Negative for back pain and gait problem.  Neurological: Negative for syncope.  Psychiatric/Behavioral: The patient is not hyperactive.        Objective:   Physical Exam  Constitutional: He is oriented to person, place, and time. He appears well-developed.       Obese  HENT:  Mouth/Throat: Oropharynx is clear and moist.  Eyes: Conjunctivae are normal. Pupils are equal, round, and reactive to light.  Neck: Normal range of motion. No JVD present. No thyromegaly present.  Cardiovascular: Normal rate, regular rhythm, normal heart sounds and intact distal pulses.  Exam reveals no gallop and no friction rub.   No murmur heard. Pulmonary/Chest: Effort normal and breath sounds normal. No respiratory distress. He has no wheezes. He has no rales. He exhibits no tenderness.  Abdominal: Soft. Bowel sounds are normal. He exhibits no distension and no mass. There is no tenderness. There is no rebound and no guarding.  Musculoskeletal: Normal range of motion. He exhibits no edema and no tenderness.  Lymphadenopathy:    He has  no cervical adenopathy.  Neurological: He is alert and oriented to person, place, and time. He has normal reflexes. No cranial nerve deficit. He exhibits normal muscle tone. Coordination normal.  Skin: Skin is warm and dry. No rash noted.  Psychiatric: He has a normal mood and affect. His behavior is normal. Judgment and thought content normal.          Assessment & Plan:  DOE (dyspnea on exertion) It was a new episode during sex. The exertion was a lot more vigorous comp. to other times We will get a stress test. Card consult  CORONARY ARTERY BYPASS GRAFT, THREE VESSEL, HX OF On Rx  CORONARY ARTERY DISEASE On Rx. No angina.  DM-2  Check labs

## 2010-07-13 ENCOUNTER — Other Ambulatory Visit: Payer: Self-pay | Admitting: Internal Medicine

## 2010-07-13 ENCOUNTER — Telehealth: Payer: Self-pay | Admitting: *Deleted

## 2010-07-13 MED ORDER — GLUCOSE BLOOD VI STRP: 1.0000 | ORAL_STRIP | Freq: Every day | Status: DC | PRN

## 2010-07-13 NOTE — Telephone Encounter (Signed)
Pt informed.  Ok to proceed/schedule with Endo referral.

## 2010-07-13 NOTE — Telephone Encounter (Signed)
Per wife pt req One touch glucose test strips

## 2010-07-13 NOTE — Telephone Encounter (Signed)
Wife called again - Pt has not been taking his metformin b/c he ran out. (RF sent in today) They do not want referral at this time, pt wants to resume med first. When does pt need f/u?

## 2010-07-13 NOTE — Telephone Encounter (Signed)
Ok RTC 3 mo with BMET, A1c 250.02 Thx

## 2010-07-14 NOTE — Telephone Encounter (Signed)
Wife informed

## 2010-07-16 ENCOUNTER — Other Ambulatory Visit: Payer: Self-pay | Admitting: *Deleted

## 2010-07-16 MED ORDER — GLUCOSE BLOOD VI STRP: 1.0000 | ORAL_STRIP | Freq: Every day | Status: DC | PRN

## 2010-07-20 NOTE — Assessment & Plan Note (Signed)
[facility]                         ELECTROPHYSIOLOGY OFFICE NOTE   NAME:Gelber, SAINT HANK                      MRN:          [id number]  DATE:02/20/2007                            DOB:          1946/01/18    SUBJECTIVE:  Mr. Matsumoto returns today for followup.  He is a very pleasant  middle-aged man with a history of hypertension, dyslipidemia and  diabetes.  Also with ischemic heart disease, status post bypass surgery.  He has preserved LV function.  He returns today for followup.  Overall  in the last six months he has done well.  He denies chest pain.  He  denies shortness of breath.  He has had no significant problems.  His  work is fairly vigorous, and he notes no particular problems at work.   PHYSICAL EXAMINATION:  GENERAL:  He is a pleasant, well-appearing man,  in no distress.  VITAL SIGNS:  Blood pressure 144/92, pulse 67 and regular, respirations  18.  Weight 246 pounds.  NECK:  No jugular venous distention.  LUNGS:  Clear bilaterally to auscultation.  No wheezes, rales or rhonchi  present.  CARDIOVASCULAR:  A regular rate and rhythm.  Normal S1 and S2.  EXTREMITIES:  Demonstrate no edema.   CURRENT MEDICATIONS:  1. Aspirin 325 mg daily.  2. Zocor 10 mg daily.  3. Prilosec 20 mg daily.  4. Folate 1 mg daily.  5. Altace 5 mg daily.  6. Actos 15 mg daily.   Electrocardiogram today demonstrates a sinus rhythm with normal axis and  intervals.   IMPRESSION:  1. Ischemic heart disease, status post bypass surgery.  2. Hypertension.  3. Diabetes.  4. Dyslipidemia.   DISCUSSION:  Overall, Mr. Kirsch is stable.  I will have him come back in  a week or two for fasting lipids.  I will see him back following that in  several months.     Doylene Canning. Ladona Ridgel, MD  Electronically Signed    GWT/MedQ  DD: 02/20/2007  DT: 02/21/2007  Job #: [id number]

## 2010-07-20 NOTE — Assessment & Plan Note (Signed)
[facility]                         ELECTROPHYSIOLOGY OFFICE NOTE   NAME:[name], Johnny VASSAR                      MRN:          [id number]  DATE:02/15/2008                            DOB:          October 21, 1945    Johnny [name] returns today for followup.  He is a very pleasant 65 year old  male with a history of ischemic heart disease and hypertension status  post bypass surgery.  He returns today for followup.  His blood pressure  is elevated today and though he states that for the most part it is  under better control, it still stays high at times.   CURRENT MEDICATIONS:  1. Aspirin 325 a day.  2. Lovastatin 20 a day.  3. Vitamin D 1000 units daily.  4. Metformin 1 g twice daily.  5. Ranitidine 300 daily.  6. Enalapril 10 daily.  7. Furosemide 40 a day.   PHYSICAL EXAMINATION:  GENERAL:  He is a pleasant middle-aged man in no  distress.  VITAL SIGNS:  The blood pressure was 154/80, the pulse was 67 and  regular, the respirations were 18, and the weight was 249 pounds.  NECK:  No jugular venous distention.  LUNGS:  Clear bilaterally to auscultation.  No wheezes, rales, or  rhonchi are present.  There is no increased work of breathing.  CARDIOVASCULAR:  Regular rate and rhythm.  Normal S1 and S2.  There is a  soft S4 gallop.  EXTREMITIES:  No edema.   The EKG demonstrated sinus rhythm with normal axis and intervals.   IMPRESSION:  1. Ischemic heart disease status post bypass surgery.  2. Hypertension.  3. Dyslipidemia.   DISCUSSION:  Johnny [name] is stable.  I have asked that we start him on  carvedilol for his hypertension today.  He will continue on his  lovastatin for his dyslipidemia.  I have encouraged him to continue and  maintain on regular exercise program.  We will see the patient back in 1  year.     Doylene Canning. Ladona Ridgel, MD  Electronically Signed    GWT/MedQ  DD: 02/15/2008  DT: 02/16/2008  Job #: [id number]

## 2010-07-23 NOTE — Op Note (Signed)
[place]. [facility]  Patient:    CHORD, Johnny [name]                      MRN: [id number] Proc. Date: 03/06/00 Adm. Date:  [id number] Disc. Date: [id number] Attending:  Waldo Laine CC:         Daisey Must, M.D. [facility]   Operative Report  PREOPERATIVE DIAGNOSIS:  Coronary occlusive disease.  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  PROCEDURE:  Coronary artery bypass grafting x 3 with the left internal mammary artery to the left anterior descending coronary artery, sequential reversed saphenous vein graft to the intermediate and first obtuse marginal.  SURGEON:  Edward B. Tyrone Sage, M.D.  FIRST ASSISTANT:  Loura Pardon, P.A.  BRIEF HISTORY:  Patient is a 65 year old male with a new diagnosis of diabetes, who presented with vague chest discomfort.  Patient underwent medical evaluation and ultimately cardiac catheterization by Daisey Must, M.D.  This revealed a patent right coronary artery with a distal left main obstruction of 70-80% with ulcerated plaque.  Because of the patients left main obstruction, coronary artery bypass grafting was recommended.  Patient agreed and signed informed consent.  Preoperatively, the patients wave forms on the palmar arch were more than 50% diminished with radial compression bilaterally.  The patient agreed to the planned procedure and signed informed consent.  DESCRIPTION OF PROCEDURE:  With Swan-Ganz and arterial line monitors in place, the patient underwent general endotracheal anesthesia without incident.  The skin of the chest and legs was scrubbed with Betadine and draped in the usual sterile manner.  A segment of vein was harvested from the left lower extremity.  Median sternotomy was performed under the manubrium.  There was a thick, fibrous plaque in the midline that made dissection of the mammary down difficult.  A segment of this fibrous plaque was sent to  pathology, and it showed only chronic inflammation and fibrosis without evidence of any malignancy.  The left internal mammary artery was dissected down as a pedicle graft.  The distal artery was divided and had excellent, free flow.  The vessel was hydrostatically dilated with heparinized saline.  The pericardium was opened.  Overall ventricular function appeared preserved.  Patient was systemically heparinized, the ascending aorta and the right atrium were cannulated, and the aortic root vent  and cardioplegia needle was introduced into the ascending aorta.  The patient was placed on cardiopulmonary bypass, 2.4 L/min. per sq m.  Sites of anastomosis were selected.  Both the intermediate and the first obtuse marginal coronary artery were relatively small vessels and were intramyocardial.  The very distal circumflex was less than 1 mm in size.  The patients body temperature was cooled to 30 degrees, the aortic crossclamp was applied, and 500 cc of cold blood potassium cardioplegia was administered with rapid diastolic arrest of the heart. Myocardial septal temperature was monitored throughout the crossclamp period. Attention was turned first to the intermediate coronary artery, which was opened and, as noted, was partially intramyocardial, but a 1.5 mm probe passed distally.  Using a short natural _____, end-to-side anastomosis was carried out.  _____ the same vein was then carried a short distance to the first obtuse marginal coronary artery.  This vessel was also intramyocardial and smaller than the intermediate.  A 1 mm probe passed through it easily.  The vessel was 1.3-1.4 mm in size.  Using a running 7-0 Prolene, distal anastomosis was performed.  Additional cold  blood cardioplegia was administered down the vein grafts.  Attention was then turned to the left anterior descending coronary artery, which was opened in the midportion of the vessel, and a 1.5 mm probe passed proximally  distally.  Using the running 8-0 Prolene, the left internal mammary artery was anastomosed to the left anterior descending coronary artery.  With release of the bulldog on the mammary artery, there was appropriate rise in myocardial septal temperature.  The aortic crossclamp was removed with total crossclamp time of 49 minutes.  The patient required electrical defibrillation and returned to a sinus rhythm. Partial occlusion clamp was placed on the ascending aorta.  A single punch aortotomy was performed.  The sequential vein graft was anastomosed to the ascending aorta, air was evacuated from the graft, and the partial occlusion clamp was removed.  Sites of anastomosis were inspected for any bleeding.  The patient was then ventilated and weaned from cardiopulmonary bypass without difficulty and remained hemodynamically stable.  He was decannulated in the usual fashion.  Protamine sulfate was administered with the operative field hemostatic.  Two atrial and two ventricular pacing wires were applied.  Graft marker was applied.  A left pleural tube and two mediastinal tubes left in place.  Pericardium was reapproximated.  Sternum closed with #6 stainless steel wire.  Fascia closed with interrupted 0 Vicryl, running 3-0 Vicryl subcutaneous tissue, 4-0 subcuticular stitch, and skin edges.  Dry dressings were applied.  Sponge and needle count were reported as correct at the conclusion of the procedure.  The patient tolerated the procedure without obvious complications.  Total pump time was 80 minutes. DD:  03/06/00 TD:  03/06/00 Job: [id number]

## 2010-07-23 NOTE — Discharge Summary (Signed)
[place]. [facility]  Patient:    Johnny [name], Johnny [name]                      MRN: [id number] Adm. Date:  [id number] Disc. Date: [id number] Attending:  Waldo Laine Dictator:   Myrlene Broker, P.A. CC:         Doylene Canning. Ladona Ridgel, M.D. [facility]  Sonda Primes, M.D. [facility]  Daisey Must, M.D. [facility]   Discharge Summary  REFERRING PHYSICIAN:  Daisey Must, M.D.  DATE OF BIRTH:  [birth year]  PRIMARY ADMISSION DIAGNOSIS/DISCHARGE DIAGNOSIS:  Coronary artery occlusive disease with left main obstruction and increasing angina episodes.  SECONDARY ADMISSION DIAGNOSES/PREEXISTING CONDITIONS: 1. Hypertension. 2. History of gastroesophageal reflux disease. 3. History of arthritis.  NEW DIAGNOSES DURING THIS ADMISSION:  Type 2 diabetes mellitus.  The patient was seen by [facility] and started on Actos and blood sugars have been controlled.  PROCEDURES DONE WHILE IN THE HOSPITAL: 1. Cardiac catheterization on March 03, 2000, by Daisey Must, M.D. 2. Ankle brachial indexes done on March 03, 2000, preoperatively. 3. CT scan of chest, which was normal, done on March 04, 2000,    preoperatively. 4. Coronary artery bypass graft surgery x 3 with the following grafts placed    by Ramon Dredge B. Tyrone Sage, M.D., on March 06, 2000:    a. The first graft was the left internal mammary artery to the left       anterior descending artery.    b. Sequential saphenous vein graft to the ramus and subsequently to the       obtuse marginal with intraoperative plaque sent to pathology.  HOSPITAL COURSE:  This patient was admitted to the hospital on March 03, 2000, for a cardiac catheterization after being seen by Doylene Canning. Ladona Ridgel, M.D., in his office on March 03, 2000, for increased angina.  The catheterization was planned on March 03, 2000.  Subsequently CVTS was consulted to see the patient for possible bypass surgery.  The patient was found to have left  main coronary artery occlusive disease and with accelerating anginal episodes, it was determined that he needed to have coronary artery bypass graft surgery which was done on March 06, 2000, with the above grafts placed.  The patients postoperative course was essentially uneventful.  The patient had been newly diagnosed with diabetes mellitus and was seen by [facility] and started on Actos.  The patients blood sugars have been controlled and is anticipated for discharge in the morning.  CONDITION AT DISCHARGE:  Improved and stable.  MEDICATIONS AT DISCHARGE: 1. Aspirin 325 mg once a day. 2. Colace 200 mg once a day.  The patient is instructed to hold for loose    stools. 3. Pepcid 20 mg once q.h.s. 4. Toprol XL 25 mg once a day. 5. Zocor 10 mg q.d. 6. Altace 5 mg q.d. 7. Actos 15 mg q.d. 8. Darvocet-N 100 one to two tablets p.o. q.4-6h. p.r.n. pain as needed.  ACTIVITY:  The patient was instructed not to do any driving or lifting more than 10 pounds.  He was instructed to walk daily and continue breathing exercises.  DIET:  To follow a low-fat, low-sodium, diabetic diet.  WOUND CARE:  The patient was told that he could shower and clean the wounds with mild soap and water.  To call the office if any wound problems arise as noted on fact sheet.  The patient was given the cardiac surgery fact sheet.  FOLLOW-UP:  The patient has a follow-up appointment with his cardiologist, Doylene Canning. Ladona Ridgel, M.D., on March 24, 2000, at 10 a.m. and will be having a chest x-ray taken at that time.  The patient then will see Ramon Dredge B. Tyrone Sage, M.D., on March 30, 2000, at 9:40 a.m. and will bring the chest x-ray from Dr. Bruna Potter office with him. DD:  03/10/00 TD:  03/10/00 Job: [id number]

## 2010-07-23 NOTE — Assessment & Plan Note (Signed)
[facility]                         ELECTROPHYSIOLOGY OFFICE NOTE   NAME:Johnny [name], Johnny [name]                      MRN:          [id number]  DATE:06/29/2006                            DOB:          09/19/1945    Mr. Chong returns today for follow-up.  He is a very pleasant middle-aged  man with ischemic heart disease who has diabetes, hypertension,  dyslipidemia, and obesity.  The patient developed shortness of breath  yesterday and went to the emergency room where he was found to be  hypertensive.  He was given Lasix with improvement, though, he has  remained somewhat dyspneic today.  He had no chest pain with this.  He  notes that prior to his bypass surgery, he did have substernal chest  pain.  He denies dietary noncompliance and he denies medical  noncompliance.   MEDICATIONS:  1. Aspirin.  2. Lopressor.  3. Zocor.  4. Prilosec.  5. Folate.  6. Altace.  7. Actos 15 a day.   PHYSICAL EXAMINATION:  GENERAL:  He is a pleasant, well-appearing,  middle-aged man in no distress.  VITAL SIGNS:  Blood pressure 154/78, pulse 42 and regular, respirations  18, and weight 240 pounds.  NECK:  No jugular venous distention.  LUNGS:  Clear bilaterally to auscultation with no wheezes, rales, or  rhonchi.  CARDIOVASCULAR:  Regular rate and rhythm with normal S1 and S2.  There  is a soft S4 gallop.  EXTREMITIES:  Demonstrate no edema.   EKG demonstrates sinus rhythm with normal axis and intervals.   IMPRESSION:  1. Ischemic heart disease.  2. Dyspnea.  3. Hypertension.  4. Diabetes.  5. Dyslipidemia.   DISCUSSION:  Mr. Dutter symptoms are unclear.  I have recommended that  he undergo exercise treadmill testing plus or minus two-dimensional  echocardiogram.  I am concerned that  he might have silent ischemia, though, I suspect it may well be that his  severe hypertension  resulted in diastolic dysfunction resulting in shortness of breath.  After a  stress test we will see him back in the office.     Doylene Canning. Ladona Ridgel, MD  Electronically Signed    GWT/MedQ  DD: 06/29/2006  DT: 06/29/2006  Job #: [id number]

## 2010-07-23 NOTE — Assessment & Plan Note (Signed)
[facility]                           ELECTROPHYSIOLOGY OFFICE NOTE   NAME:Johnny [name], Johnny [name]                      MRN:          [id number]  DATE:01/30/2006                            DOB:          October 10, 1945    [name] returns today for followup.  He is a very pleasant middle-aged man  with a history of ischemic heart disease, and preserved LV function,  hypertension, diabetes, dyslipidemia, and erectile dysfunction who returns  today for followup.  Overall, he has been stable.  He denies any chest pain  or shortness of breath.  When I saw him last several months ago, he had  admitted to all of the above complaints.  The patient had been recommended  to stop is beta blocker to see if his erectile dysfunction would improve,  however he has not done this, citing concerns about his heart.  He had no  other specific complaints today.  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   On exam, he is a pleasant, well-appearing middle-aged man in no distress.  Blood pressure 126/82, pulse was 46 and regular, the respirations were 18.  The weight was 243 pounds.  HEENT:  Normal.  NECK:  Revealed no jugular venous distention.  There was no thyromegaly.  LUNGS:  Clear bilaterally to auscultation.  There were no wheezes, rales or  rhonchi.  CARDIOVASCULAR:  Regular bradycardia with normal S1 and S2.  EXTREMITIES:  Demonstrate no edema.   EKG demonstrates sinus bradycardia, otherwise normal.   IMPRESSION:  1. Ischemic heart disease with preserved LV function.  2. Diabetes.  3. Hypertension.  4. Dyslipidemia.  5. Obesity.   DISCUSSION:  Because [name] blood pressure is fairly well controlled, I  have recommended that he wean himself off the Toprol to see how much Toprol  is contributing to his erectile dysfunction.  Obviously if he develops chest  pain or other symptoms while coming off the  Toprol, then he has been  instructed to call us.  If he notes that off Toprol that his erectile  dysfunction improves, then my recommendation will be to allow him to stay  off of Toprol and try other drugs for control of his blood pressure.  He  will continue on Altace in this regard.  We will plan to see him back in  several months.     Doylene Canning. Ladona Ridgel, MD  Electronically Signed    GWT/MedQ  DD: 01/30/2006  DT: 01/30/2006  Job #: [id number]

## 2010-07-23 NOTE — Cardiovascular Report (Signed)
[place]. [facility]  Patient:    Johnny [name], Johnny [name]                      MRN: [id number] Proc. Date: 03/03/00 Adm. Date:  [id number] Attending:  Daisey Must CC:         Sonda Primes, M.D. [facility]             Doylene Canning. Ladona Ridgel, M.D. [facility]             Cardiac Catheterization Laboratory                        Cardiac Catheterization  PROCEDURES PERFORMED:  Left heart catheterization with coronary angiography and left ventriculography.  INDICATIONS:  Mr. Vasil is a 65 year old male, who has been experiencing exertional chest discomfort.  An exercise treadmill test was notable for profound ST segment depression during exercise.  However, subsequent Cardiolite images showed no perfusion defects.  However, because of his recurrent symptoms and the markedly abdominal stress ECG, he was referred for cardiac catheterization.  DESCRIPTION OF PROCEDURE:  A 6 French sheath was placed in the right femoral artery.  Standard Judkins 6 French catheters were utilized.  Contrast was Omnipaque.  There were no complications.  RESULTS:  HEMODYNAMICS:  Left ventricular pressure 140/18, aortic pressure 144/78. There was no aortic valve gradient.  LEFT VENTRICULOGRAM:  Bothwell motion is normal.  Ejection fraction calculated at 69%.  No mitral regurgitation.  CORONARY ARTERIOGRAPHY:  (Right dominant).  Left main:  Left main has a 75% stenosis at he distal aspect just before the bifurcation with an associated ulceration.  Left anterior descending:  The left anterior descending artery has a 40% stenosis in the proximal vessel, tubular 40% stenosis in the mid vessel and a 50% stenosis in the distal vessel.  There is a normal sized first diagonal arising from the very proximal LAD which has a 30% stenosis.  The second diagonal is also normal in size and has a 30% stenosis.  Left circumflex:  The left circumflex has a 30% stenosis in the mid vessel and 20% in the distal vessel.  The  circumflex gives rise to normal sized first marginal, small second and third marginal, and a normal sized forth marginal.  Right coronary artery:  The right coronary artery is a dominant vessel.  There is a diffuse 30% stenosis in the proximal vessel and a tubular 40% stenosis in the mid vessel.  The distal right coronary artery gives rise to a normal sized posterior descending artery and three small posterolateral branches.  IMPRESSIONS: 1. Normal left ventricular systolic function. 2. Two-vessel coronary artery disease characterized by significant complex    stenosis at the distal left main coronary artery.  PLAN:  The patient will be admitted and started on Lovenox in addition to his aspirin and beta blocker therapy.  Cardiovascular surgery will be consulted for coronary artery bypass grafting. DD:  03/03/00 TD:  03/03/00 Job: [id number]

## 2010-07-26 ENCOUNTER — Encounter: Payer: Self-pay | Admitting: Internal Medicine

## 2010-08-03 ENCOUNTER — Ambulatory Visit (INDEPENDENT_AMBULATORY_CARE_PROVIDER_SITE_OTHER): Payer: Medicare Other | Admitting: Internal Medicine

## 2010-08-03 ENCOUNTER — Encounter: Payer: Self-pay | Admitting: Internal Medicine

## 2010-08-03 DIAGNOSIS — I5022 Chronic systolic (congestive) heart failure: Secondary | ICD-10-CM

## 2010-08-03 DIAGNOSIS — I10 Essential (primary) hypertension: Secondary | ICD-10-CM

## 2010-08-03 DIAGNOSIS — I2589 Other forms of chronic ischemic heart disease: Secondary | ICD-10-CM

## 2010-08-03 DIAGNOSIS — I259 Chronic ischemic heart disease, unspecified: Secondary | ICD-10-CM

## 2010-08-03 MED ORDER — NITROGLYCERIN 0.4 MG SL SUBL: 0.4000 mg | SUBLINGUAL_TABLET | SUBLINGUAL | Status: DC | PRN

## 2010-08-03 NOTE — Assessment & Plan Note (Signed)
His blood pressure is elevated today. I recommended that he maintain a low-sodium diet and not miss any of his medications.

## 2010-08-03 NOTE — Assessment & Plan Note (Signed)
The etiology of the patient's symptoms is unclear. The patient could have worsening coronary disease with his chest pressure and shortness of breath with exertion. Most the time he is fairly sedentary. After discussing a period of watchful waiting versus proceeding with exercise treadmill testing, I have recommended proceeding with exercise stress testing. Additional recommendations will follow the results of stress testing. He will continue his current medications for now.

## 2010-08-03 NOTE — Patient Instructions (Signed)
Your physician recommends that you schedule a follow-up appointment--depending on what stress test shows  Your physician has requested that you have en exercise stress myoview. For further information please visit [email or web address] Please follow instruction sheet, as given.

## 2010-08-03 NOTE — Progress Notes (Signed)
HPI Johnny [name] returns today for followup. He is a pleasant man with an ischemic cardiomyopathy, class II congestive heart failure, hypertension, and dyslipidemia. The patient was recently undergoing extreme exertion when he developed substernal chest discomfort and shortness of breath. Eventually the spell subsided. He did not seek medical attention. Since then he has been asymptomatic. He denies medical or dietary compliance problems. Allergies  Allergen Reactions  . Hydrocodone-Acetaminophen     REACTION: nausea     Current Outpatient Prescriptions  Medication Sig Dispense Refill  . aspirin 325 MG tablet Take 325 mg by mouth daily.        . cholecalciferol (VITAMIN D) 1000 UNITS tablet Take 1,000 Units by mouth daily.        [name] doxazosin (CARDURA) 8 MG tablet Take 8 mg by mouth daily.        . enalapril (VASOTEC) 10 MG tablet Take 10 mg by mouth daily.        [name] esomeprazole (NEXIUM) 40 MG capsule Take 40 mg by mouth daily before breakfast.        . furosemide (LASIX) 40 MG tablet Take 40 mg by mouth daily.        [name] glimepiride (AMARYL) 4 MG tablet Take 4 mg by mouth daily.        [name] glucose blood (ONE TOUCH ULTRA TEST) test strip 1 each by Other route daily as needed (DX 250.00).  100 each  3  . lovastatin (MEVACOR) 20 MG tablet Take 20 mg by mouth daily.        . metFORMIN (GLUCOPHAGE) 1000 MG tablet TAKE ONE TABLET BY MOUTH TWICE DAILY  180 tablet  1  . omeprazole (PRILOSEC) 20 MG capsule Take 20 mg by mouth daily.        . traMADol (ULTRAM) 50 MG tablet Take 50-100 mg by mouth 2 (two) times daily as needed. For pain.       . nitroGLYCERIN (NITROSTAT) 0.4 MG SL tablet Place 1 tablet (0.4 mg total) under the tongue every 5 (five) minutes as needed for chest pain.  90 tablet  3     Past Medical History  Diagnosis Date  . Benign prostatic hypertrophy   . Arthritis   . Benign positional vertigo   . ED (erectile dysfunction)   . Chronic ischemic heart disease, unspecified   . Family  history of ischemic heart disease   . HTN (hypertension)   . Hyperlipidemia   . Esophageal reflux   . Type II or unspecified type diabetes mellitus without mention of complication, not stated as uncontrolled   . CAD (coronary artery disease)   . Observation for suspected malignant neoplasm   . Routine general medical examination at a health care facility   . Balanitis     ROS:   All systems reviewed and negative except as noted in the HPI.   Past Surgical History  Procedure Date  . Coronary artery bypass graft     third vessel, hx of  . Penile prosthesis implant 2011  . Cataract extraction 2011     Family History  Problem Relation Age of Onset  . Coronary artery disease Other     1st degree male relative  . Hypertension Other   . Hypertension Mother   . Diabetes Father      History   Social History  . Marital Status: Married    Spouse Name: N/A    Number of Children: N/A  . Years of Education: N/A  Occupational History  . Preacher    Social History Main Topics  . Smoking status: Former Games developer  . Smokeless tobacco: Not on file  . Alcohol Use: No  . Drug Use: Not on file  . Sexually Active: Not on file   Other Topics Concern  . Not on file   Social History Narrative   Regular Exercise-No     BP 148/78  Pulse 80  Resp 16  Ht 5\' 11"  (1.803 m)  Wt 242 lb (109.77 kg)  BMI 33.75 kg/m2  Physical Exam:  Well appearing NAD HEENT: Unremarkable Neck:  No JVD, no thyromegally Lymphatics:  No adenopathy Back:  No CVA tenderness Lungs:  Clear HEART:  Regular rate rhythm, no murmurs, no rubs, no clicks Abd:  Flat, positive bowel sounds, no organomegally, no rebound, no guarding Ext:  2 plus pulses, no edema, no cyanosis, no clubbing Skin:  No rashes no nodules Neuro:  CN II through XII intact, motor grossly intact  EKG Normal sinus rhythm with nonspecific ST-T changes. These could be consistent with ischemia.  Assess/Plan:

## 2010-08-09 ENCOUNTER — Ambulatory Visit (HOSPITAL_COMMUNITY): Payer: Medicare Other | Attending: Internal Medicine | Admitting: Radiology

## 2010-08-09 DIAGNOSIS — I2589 Other forms of chronic ischemic heart disease: Secondary | ICD-10-CM | POA: Insufficient documentation

## 2010-08-09 DIAGNOSIS — R0989 Other specified symptoms and signs involving the circulatory and respiratory systems: Secondary | ICD-10-CM

## 2010-08-09 DIAGNOSIS — I5022 Chronic systolic (congestive) heart failure: Secondary | ICD-10-CM

## 2010-08-09 DIAGNOSIS — Z951 Presence of aortocoronary bypass graft: Secondary | ICD-10-CM | POA: Insufficient documentation

## 2010-08-09 DIAGNOSIS — I10 Essential (primary) hypertension: Secondary | ICD-10-CM

## 2010-08-09 DIAGNOSIS — I2510 Atherosclerotic heart disease of native coronary artery without angina pectoris: Secondary | ICD-10-CM | POA: Insufficient documentation

## 2010-08-09 DIAGNOSIS — I491 Atrial premature depolarization: Secondary | ICD-10-CM

## 2010-08-09 MED ORDER — TECHNETIUM TC 99M TETROFOSMIN IV KIT
11.0000 | PACK | Freq: Once | INTRAVENOUS | Status: AC | PRN
  Administered 2010-08-09: 11 via INTRAVENOUS

## 2010-08-09 MED ORDER — TECHNETIUM TC 99M TETROFOSMIN IV KIT
33.0000 | PACK | Freq: Once | INTRAVENOUS | Status: AC | PRN
  Administered 2010-08-09: 33 via INTRAVENOUS

## 2010-08-09 MED ORDER — REGADENOSON 0.4 MG/5ML IV SOLN
0.4000 mg | Freq: Once | INTRAVENOUS | Status: AC
  Administered 2010-08-09: 0.4 mg via INTRAVENOUS

## 2010-08-09 NOTE — Progress Notes (Deleted)
[facility] SITE 3 NUCLEAR MED [address] [place] [address] [phone]  Cardiology Nuclear Med Study  Johnny [name] is a 65 y.o. male [id number] 1945/06/17   Nuclear Med Background Indication for Stress Test:  Evaluation for Ischemia and Graft Patency History:  {CHL HISTORY STRESS TEST:21021012} Cardiac Risk Factors: Family History - CAD, History of Smoking, Hypertension, Lipids and NIDDM  Symptoms:  {CHL SYMPTOMS STRESS TEST:21021013}   Nuclear Pre-Procedure Caffeine/Decaff Intake:  None NPO After: 8:00pm   Lungs:  *** IV 0.9% NS with Angio Cath:  20g  IV Site: R Antecubital  IV Started by:  Irean Hong, RN  Chest Size (in):  46 Cup Size: n/a  Height: 5\' 11"  (1.803 m)  Weight:  240 lb (108.863 kg)  BMI:  Body mass index is 33.47 kg/(m^2). Tech Comments:  N/A    Nuclear Med Study 1 or 2 day study: {CHL 1 OR 2 DAY STUDY:21021019}  Stress Test Type:  {CHL STRESS TEST TYPE:21021018}  Reading MD: {CHL LB NUC READING OZ:30865784}  Order Authorizing Provider:  ***  Resting Radionuclide: {CHL RESTING RADIONUCLIDE:21021021}  Resting Radionuclide Dose: *** mCi   Stress Radionuclide:  {CHL STRESS RADIONUCLIDE:21021022}  Stress Radionuclide Dose: *** mCi           Stress Protocol Rest HR: *** Stress HR: ***  Rest BP: *** Stress BP: ***  Exercise Time (min): {NA AND WILDCARD:21589} METS: {NA AND WILDCARD:21589}          Dose of Adenosine (mg):  {NA AND ONGEXBMW:41324} Dose of Lexiscan: {CHL CARD WILDCARD AND 0.4:21590} mg  Dose of Atropine (mg): {NA AND MWNUUVOZ:36644} Dose of Dobutamine: {NA AND WILDCARD:21589} mcg/kg/min (at max HR)  Stress Test Technologist: {CHL LB STRESS TEST TECHNOLOGIST:21021024}  Nuclear Technologist:  {CHL LB NUCLEAR TECHNOLOGIST:21021025}     Rest Procedure:  {CHL REST PROCEDURE NUCLEAR:21021027} Rest ECG: {CHL REST IHK:74259}  Stress Procedure:  {CHL STRESS PROCEDURE NUCLEAR:21021028} Stress ECG: {CHL CAR STRESS  ECG:21561}  QPS Raw Data Images:  {CHL RAW DATA IMAGES NUC:21021029} Stress Images:  {CHL STRESS IMAGES NUC:21021030} Rest Images:  {CHL REST IMAGES NUC:21021031} Subtraction (SDS):  {CHL SUBTRACTION (SDS) NUC:21021032} Transient Ischemic Dilatation (Normal <1.22):  *** Lung/Heart Ratio (Normal <0.45):  ***  Quantitative Gated Spect Images QGS EDV:  *** ml QGS ESV:  *** ml QGS cine images:  {CHL CARD QGS:21591:o} QGS EF: {CHL CARD STUDY NOT GATED:21592:o}  Impression Exercise Capacity:  {CHL EXERCISE CAPACITY NUC:21021037} BP Response:  {CHL BP RESPONSE NUC:21021038} Clinical Symptoms:  {CHL CLINICAL SYMPTOMS NUC:21021039} ECG Impression:  {CHL ECG IMPRESSION NUC:21021040} Comparison with Prior Nuclear Study: {CHL NUCLEAR STUDY COMPARISON:21562}  Overall Impression:  {CHL OVERALL IMPRESSION NUC:21021041}

## 2010-08-09 NOTE — Progress Notes (Signed)
[facility] SITE 3 NUCLEAR MED [address] [place] [address] [phone]  Cardiology Nuclear Med Study  Johnny [name] is a 65 y.o. male [id number] 1945/03/10   Nuclear Med Background Indication for Stress Test:  Evaluation for Ischemia and Graft Patency History: 01 CABG x 3, 01 Heart Catheterization: multivessel disease with CABG and 08 Myocardial Perfusion Study: positive EKG,mild inferobasilar thinning and EF=605 Cardiac Risk Factors: Family History - CAD, History of Smoking, Hypertension, Lipids and NIDDM  Symptoms:  DOE   Nuclear Pre-Procedure Caffeine/Decaff Intake:  none NPO After: 8:00pm   Lungs:  clear IV 0.9% NS with Angio Cath:  22g  IV Site: R Antecubital  IV Started by:  Irean Hong, RN  Chest Size (in):  46 Cup Size: n/a  Height: 5\' 11"  (1.803 m)  Weight:  240 lb (108.863 kg)  BMI:  Body mass index is 33.47 kg/(m^2). Tech Comments:  none    Nuclear Med Study 1 or 2 day study: 1 day  Stress Test Type:  Treadmill/Lexiscan  Reading MD: Kristeen Miss, MD  Order Authorizing Provider:  Lewayne Bunting  Resting Radionuclide: Technetium 59m Tetrofosmin  Resting Radionuclide Dose: 11 mCi   Stress Radionuclide:  Technetium 60m Tetrofosmin  Stress Radionuclide Dose: 33 mCi           Stress Protocol Rest HR: 54 Stress HR: 126  Rest BP: 123/60 Stress BP: 184/58  Exercise Time (min): 6:30 METS: 4.9   Predicted Max HR: 155 bpm % Max HR: 81.29 bpm Rate Pressure Product: 57846   Dose of Adenosine (mg):  n/a Dose of Lexiscan: 0.4 mg  Dose of Atropine (mg): n/a Dose of Dobutamine: n/a mcg/kg/min (at max HR)  Stress Test Technologist: Cathlyn Parsons, RN  Nuclear Technologist:  Domenic Polite, CNMT     Rest Procedure:  Myocardial perfusion imaging was performed at rest 45 minutes following the intravenous administration of Technetium 69m Tetrofosmin. Rest ECG: Sinus Bradycardia  Stress Procedure: The patient attempted to walk on the [place]  Protocol for 4:30.  Patient was unable to continue due to hip pain. The patient was changed to a Lexiscan.The patient received IV Lexiscan 0.4 mg over 15-seconds with concurrent lower level exercise and then Technetium 1m Tetrofosmin was injected at 30-seconds while the patient continued walking one more minute. Patient had occasional PAC's and rare PVC. There were nonspecific ST-T changes with Lexiscan.  Quantitative spect images were obtained after a 45-minute delay. Stress ECG: The patient had minimal ST changes at baseline that worsened slightly with exercise.  Not diagnostic for ischemia.  QPS Raw Data Images:  Normal; no motion artifact; normal heart/lung ratio. Stress Images:  Normal homogeneous uptake in all areas of the myocardium. Rest Images:  Normal homogeneous uptake in all areas of the myocardium. Subtraction (SDS):  No evidence of ischemia. Transient Ischemic Dilatation (Normal <1.22):  1.02 Lung/Heart Ratio (Normal <0.45):  0.26  Quantitative Gated Spect Images QGS EDV:  119 ml QGS ESV:  55 ml QGS cine images:  NL LV Function; NL Borland Motion QGS EF: 54%  Impression Exercise Capacity:  Fair exercise capacity. BP Response:  Normal blood pressure response. Clinical Symptoms:  No chest pain. ECG Impression:  No significant ST segment change suggestive of ischemia. Comparison with Prior Nuclear Study: No images to compare  Overall Impression:  Normal stress nuclear study.  No evidence of ishcmia.  Normal LV function.   Elyn Aquas., MD, [facility]

## 2010-08-10 ENCOUNTER — Ambulatory Visit: Payer: Medicare Other | Admitting: Internal Medicine

## 2010-08-10 NOTE — Progress Notes (Signed)
Copy routed to Dr.Taylor.Scarlette Ar

## 2010-08-13 ENCOUNTER — Telehealth: Payer: Self-pay | Admitting: Internal Medicine

## 2010-08-13 NOTE — Telephone Encounter (Signed)
Called and gave patient the results of the stress test

## 2010-08-13 NOTE — Telephone Encounter (Signed)
Pt 's wife calling re results from stress test

## 2010-08-17 ENCOUNTER — Telehealth: Payer: Self-pay | Admitting: *Deleted
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
## 2010-08-17 NOTE — Telephone Encounter (Signed)
Informed Pt. To call Dr [name]'s office and inquire as to if they have received the MRI results as of yet.

## 2010-08-24 ENCOUNTER — Telehealth: Payer: Self-pay | Admitting: Cardiology
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
## 2010-08-24 NOTE — Telephone Encounter (Signed)
  Called back and spoke with pt's wife.  Let her know stress looks great.  He needs to take the Mobic one daily for one month  Let her know that this is okay Try not to use long term

## 2010-10-14 ENCOUNTER — Other Ambulatory Visit (INDEPENDENT_AMBULATORY_CARE_PROVIDER_SITE_OTHER): Payer: Medicare Other

## 2010-10-14 ENCOUNTER — Encounter: Payer: Self-pay | Admitting: Internal Medicine

## 2010-10-14 ENCOUNTER — Ambulatory Visit (INDEPENDENT_AMBULATORY_CARE_PROVIDER_SITE_OTHER): Payer: Medicare Other | Admitting: Internal Medicine

## 2010-10-14 DIAGNOSIS — E119 Type 2 diabetes mellitus without complications: Secondary | ICD-10-CM

## 2010-10-14 DIAGNOSIS — M25559 Pain in unspecified hip: Secondary | ICD-10-CM

## 2010-10-14 DIAGNOSIS — Z01818 Encounter for other preprocedural examination: Secondary | ICD-10-CM | POA: Insufficient documentation

## 2010-10-14 DIAGNOSIS — K219 Gastro-esophageal reflux disease without esophagitis: Secondary | ICD-10-CM

## 2010-10-14 DIAGNOSIS — I10 Essential (primary) hypertension: Secondary | ICD-10-CM

## 2010-10-14 DIAGNOSIS — E785 Hyperlipidemia, unspecified: Secondary | ICD-10-CM

## 2010-10-14 LAB — COMPREHENSIVE METABOLIC PANEL
Albumin: 4.1 g/dL (ref 3.5–5.2)
Alkaline Phosphatase: 78 U/L (ref 39–117)
BUN: 12 mg/dL (ref 6–23)
CO2: 26 mEq/L (ref 19–32)
Calcium: 9.1 mg/dL (ref 8.4–10.5)
Chloride: 104 mEq/L (ref 96–112)
GFR: 109.99 mL/min (ref >60.00)
Glucose, Bld: 129 mg/dL — ABNORMAL HIGH (ref 70–99)
Potassium: 4.1 mEq/L (ref 3.5–5.1)
Sodium: 139 mEq/L (ref 135–145)
Total Protein: 7.8 g/dL (ref 6.0–8.3)

## 2010-10-14 LAB — URINALYSIS
Bilirubin Urine: NEGATIVE
Ketones, ur: NEGATIVE
Leukocytes, UA: NEGATIVE
Specific Gravity, Urine: >=1.03 (ref 1.000–1.030)
Urine Glucose: NEGATIVE
pH: 6 (ref 5.0–8.0)

## 2010-10-14 LAB — CBC WITH DIFFERENTIAL/PLATELET
Basophils Relative: 0.8 % (ref 0.0–3.0)
Eosinophils Relative: 2 % (ref 0.0–5.0)
Lymphocytes Relative: 30.9 % (ref 12.0–46.0)
MCV: 83.6 fl (ref 78.0–100.0)
Monocytes Absolute: 0.5 10*3/uL (ref 0.1–1.0)
Neutrophils Relative %: 57.5 % (ref 43.0–77.0)
Platelets: 286 10*3/uL (ref 150.0–400.0)
RBC: 4.7 Mil/uL (ref 4.22–5.81)
WBC: 5.7 10*3/uL (ref 4.5–10.5)

## 2010-10-14 LAB — LIPID PANEL
Cholesterol: 141 mg/dL (ref 0–200)
HDL: 40.5 mg/dL (ref >39.00)
LDL Cholesterol: 79 mg/dL (ref 0–99)
Total CHOL/HDL Ratio: 3
Triglycerides: 106 mg/dL (ref 0.0–149.0)

## 2010-10-14 LAB — TSH: TSH: 3.59 u[IU]/mL (ref 0.35–5.50)

## 2010-10-14 NOTE — Assessment & Plan Note (Signed)
L THR is pending

## 2010-10-14 NOTE — Assessment & Plan Note (Signed)
On Rx 

## 2010-10-14 NOTE — Assessment & Plan Note (Addendum)
CL was ok in May 2012. Labs ordered.  He is clear for L THR in Nov 2012. I would recommend telemetry x 24 h post-op. Thank you!

## 2010-10-14 NOTE — Assessment & Plan Note (Signed)
Controlled on Rx 

## 2010-10-14 NOTE — Assessment & Plan Note (Signed)
Check labs 

## 2010-10-14 NOTE — Progress Notes (Signed)
Subjective:    Patient ID: Johnny [name], male    DOB: September 14, 1945, 65 y.o.   MRN: [id number]  HPI   IM Consult Req by Dr Lequita Halt Reason: Medical clearance for L THR 01/24/11 The patient presents for a follow-up of  chronic hypertension, chronic dyslipidemia, type 2 diabetes all controlled with medicines and diet Past Medical History  Diagnosis Date  . Benign prostatic hypertrophy   . Arthritis   . Benign positional vertigo   . ED (erectile dysfunction)   . Chronic ischemic heart disease, unspecified   . Family history of ischemic heart disease   . HTN (hypertension)   . Hyperlipidemia   . Esophageal reflux   . Type II or unspecified type diabetes mellitus without mention of complication, not stated as uncontrolled   . CAD (coronary artery disease)   . Observation for suspected malignant neoplasm   . Routine general medical examination at a health care facility   . Balanitis    Past Surgical History  Procedure Date  . Coronary artery bypass graft     third vessel, hx of  . Penile prosthesis implant 2011  . Cataract extraction 2011    reports that he has quit smoking. He does not have any smokeless tobacco history on file. He reports that he does not drink alcohol. His drug history not on file. family history includes Coronary artery disease in his other; Diabetes in his father; and Hypertension in his mother and other. Allergies  Allergen Reactions  . Hydrocodone-Acetaminophen     REACTION: nausea   Current Outpatient Prescriptions on File Prior to Visit  Medication Sig Dispense Refill  . aspirin 325 MG tablet Take 325 mg by mouth daily.        . cholecalciferol (VITAMIN D) 1000 UNITS tablet Take 1,000 Units by mouth daily.        [name] doxazosin (CARDURA) 8 MG tablet Take 8 mg by mouth daily.        . enalapril (VASOTEC) 10 MG tablet Take 10 mg by mouth daily.        [name] esomeprazole (NEXIUM) 40 MG capsule Take 40 mg by mouth daily before breakfast.        . glimepiride  (AMARYL) 4 MG tablet Take 4 mg by mouth daily.        [name] glucose blood (ONE TOUCH ULTRA TEST) test strip 1 each by Other route daily as needed (DX 250.00).  100 each  3  . lovastatin (MEVACOR) 20 MG tablet Take 20 mg by mouth daily.        . metFORMIN (GLUCOPHAGE) 1000 MG tablet TAKE ONE TABLET BY MOUTH TWICE DAILY  180 tablet  1  . nitroGLYCERIN (NITROSTAT) 0.4 MG SL tablet Place 1 tablet (0.4 mg total) under the tongue every 5 (five) minutes as needed for chest pain.  90 tablet  3  . omeprazole (PRILOSEC) 20 MG capsule Take 20 mg by mouth daily.        . traMADol (ULTRAM) 50 MG tablet Take 50-100 mg by mouth 2 (two) times daily as needed. For pain.       . furosemide (LASIX) 40 MG tablet Take 40 mg by mouth daily.         BP 130/80  Pulse 88  Temp(Src) 98.9 F (37.2 C) (Oral)  Resp 16  Wt 242 lb (109.77 kg)   Review of Systems  Constitutional: Negative for appetite change, fatigue and unexpected weight change.  HENT: Negative for nosebleeds, congestion,  sore throat, sneezing, trouble swallowing and neck pain.   Eyes: Negative for itching and visual disturbance.  Respiratory: Negative for cough.   Cardiovascular: Negative for chest pain, palpitations and leg swelling.  Gastrointestinal: Negative for nausea, diarrhea, blood in stool and abdominal distention.  Genitourinary: Negative for frequency and hematuria.  Musculoskeletal: Positive for back pain, arthralgias (B hips L>>R) and gait problem. Negative for joint swelling.  Skin: Negative for rash.  Neurological: Negative for dizziness, tremors, speech difficulty and weakness.  Psychiatric/Behavioral: Negative for sleep disturbance, dysphoric mood and agitation. The patient is not nervous/anxious.        Objective:   Physical Exam  Constitutional: He is oriented to person, place, and time. He appears well-developed.  HENT:  Mouth/Throat: Oropharynx is clear and moist.  Eyes: Conjunctivae are normal. Pupils are equal, round, and  reactive to light.  Neck: Normal range of motion. No JVD present. No thyromegaly present.  Cardiovascular: Normal rate, regular rhythm, normal heart sounds and intact distal pulses.  Exam reveals no gallop and no friction rub.   No murmur heard. Pulmonary/Chest: Effort normal and breath sounds normal. No respiratory distress. He has no wheezes. He has no rales. He exhibits no tenderness.  Abdominal: Soft. Bowel sounds are normal. He exhibits no distension and no mass. There is no tenderness. There is no rebound and no guarding.  Musculoskeletal: Normal range of motion. He exhibits tenderness (B hips are tender w/ROM). He exhibits no edema.  Lymphadenopathy:    He has no cervical adenopathy.  Neurological: He is alert and oriented to person, place, and time. He has normal reflexes. No cranial nerve deficit. He exhibits normal muscle tone. Coordination normal.  Skin: Skin is warm and dry. No rash noted.  Psychiatric: He has a normal mood and affect. His behavior is normal. Judgment and thought content normal.     Lab Results  Component Value Date   WBC 5.7 10/14/2010   HGB 13.1 10/14/2010   HCT 39.3 10/14/2010   PLT 286.0 10/14/2010   CHOL 141 10/14/2010   TRIG 106.0 10/14/2010   HDL 40.50 10/14/2010   LDLDIRECT 78.1 09/16/2008   ALT 20 10/14/2010   AST 18 10/14/2010   NA 139 10/14/2010   K 4.1 10/14/2010   CL 104 10/14/2010   CREATININE 0.9 10/14/2010   BUN 12 10/14/2010   CO2 26 10/14/2010   TSH 3.59 10/14/2010   PSA 1.28 10/24/2008   HGBA1C 6.9* 10/14/2010         Assessment & Plan:

## 2010-10-27 ENCOUNTER — Other Ambulatory Visit: Payer: Self-pay | Admitting: Internal Medicine

## 2010-10-27 ENCOUNTER — Telehealth: Payer: Self-pay | Admitting: *Deleted

## 2010-10-27 NOTE — Telephone Encounter (Signed)
traMADol (ULTRAM) 50 MG tablet [Pharmacy Med Name: TRAMA-DOL HCL 50MG  TAB] TAKE ONE TABLET BY MOUTH TWICE DAILY AS NEEDED FOR PAIN Disp: 60 each R: 1 Start: 10/27/2010 Class: Normal Originally ordered on: 06/18/2010 Last refill: 08/20/2010

## 2010-10-27 NOTE — Telephone Encounter (Signed)
OK to fill this prescription with additional refills x5 Thank you!  

## 2010-10-28 MED ORDER — TRAMADOL HCL 50 MG PO TABS: 50.0000 mg | ORAL_TABLET | Freq: Two times a day (BID) | ORAL | Status: DC | PRN

## 2010-11-29 ENCOUNTER — Other Ambulatory Visit: Payer: Self-pay | Admitting: Internal Medicine

## 2010-12-14 ENCOUNTER — Other Ambulatory Visit: Payer: Self-pay | Admitting: Internal Medicine

## 2011-01-11 ENCOUNTER — Encounter (HOSPITAL_COMMUNITY): Payer: Self-pay | Admitting: Pharmacy Technician

## 2011-01-11 ENCOUNTER — Other Ambulatory Visit: Payer: Self-pay | Admitting: Internal Medicine

## 2011-01-13 ENCOUNTER — Encounter (HOSPITAL_COMMUNITY): Payer: Medicare Other

## 2011-01-13 ENCOUNTER — Ambulatory Visit (HOSPITAL_COMMUNITY)
Admission: RE | Admit: 2011-01-13 | Discharge: 2011-01-13 | Disposition: A | Discharge status: Home / Self Care | Payer: Medicare Other | Source: Ambulatory Visit | Attending: Orthopedic Surgery | Admitting: Orthopedic Surgery

## 2011-01-13 ENCOUNTER — Encounter (HOSPITAL_COMMUNITY): Payer: Self-pay

## 2011-01-13 DIAGNOSIS — M169 Osteoarthritis of hip, unspecified: Secondary | ICD-10-CM | POA: Insufficient documentation

## 2011-01-13 DIAGNOSIS — Z01812 Encounter for preprocedural laboratory examination: Secondary | ICD-10-CM | POA: Insufficient documentation

## 2011-01-13 DIAGNOSIS — Z951 Presence of aortocoronary bypass graft: Secondary | ICD-10-CM | POA: Insufficient documentation

## 2011-01-13 DIAGNOSIS — Z01818 Encounter for other preprocedural examination: Secondary | ICD-10-CM | POA: Insufficient documentation

## 2011-01-13 DIAGNOSIS — M1610 Unilateral primary osteoarthritis, unspecified hip: Secondary | ICD-10-CM | POA: Insufficient documentation

## 2011-01-13 LAB — URINALYSIS, ROUTINE W REFLEX MICROSCOPIC
Bilirubin Urine: NEGATIVE
Glucose, UA: NEGATIVE mg/dL
Hgb urine dipstick: NEGATIVE
Ketones, ur: NEGATIVE mg/dL
Protein, ur: NEGATIVE mg/dL
pH: 6 (ref 5.0–8.0)

## 2011-01-13 LAB — COMPREHENSIVE METABOLIC PANEL
ALT: 14 U/L (ref 0–53)
AST: 13 U/L (ref 0–37)
BUN: 9 mg/dL (ref 6–23)
Creatinine, Ser: 0.83 mg/dL (ref 0.50–1.35)
GFR calc Af Amer: >90 mL/min (ref >90)
GFR calc non Af Amer: 90 mL/min — ABNORMAL LOW (ref >90)
Potassium: 4.1 mEq/L (ref 3.5–5.1)
Total Bilirubin: 0.3 mg/dL (ref 0.3–1.2)
Total Protein: 7.5 g/dL (ref 6.0–8.3)

## 2011-01-13 LAB — CBC
HCT: 38.3 % — ABNORMAL LOW (ref 39.0–52.0)
MCHC: 33.2 g/dL (ref 30.0–36.0)
RDW: 14.9 % (ref 11.5–15.5)

## 2011-01-13 LAB — APTT: aPTT: 35 seconds (ref 24–37)

## 2011-01-13 NOTE — Patient Instructions (Signed)
[address] Johnny [name]  01/13/2011   Your procedure is scheduled on: 01-24-11  Report to [facility] at 1200noon  Call this number if you have problems the morning of surgery: [phone]   Remember:   Do not eat food:After Midnight.  Do not drink clear liquids: 6 Hours before arrival.(nothing after 08:00am 01-24-11)  Take these medicines the morning of surgery with A SIP OF WATER:Cardura, Mevacor, Prilosec, Tramadol. Do not take any Diabetic meds AM of surgery. Take as usual other days prior.   Do not wear jewelry, make-up or nail polish.  Do not wear lotions, powders, or perfumes. You may wear deodorant.  Do not shave 48 hours prior to surgery.  Do not bring valuables to the hospital.  Contacts, dentures or bridgework may not be worn into surgery.  Leave suitcase in the car. After surgery it may be brought to your room.  For patients admitted to the hospital, checkout time is 11:00 AM the day of discharge.   Patients discharged the day of surgery will not be allowed to drive home.  Name and phone number of your driver: Johnny [name], spouse [phone].  Special Instructions: CHG Shower Use Special Wash: 1/2 bottle night before surgery and 1/2 bottle morning of surgery.   Please read over the following fact sheets that you were given: Blood Transfusion Information and MRSA Information. Incentive Spirometry instructions.

## 2011-01-13 NOTE — Pre-Procedure Instructions (Addendum)
01-13-11(EKG 08-03-10) with chart. Clearance note with chart Dr. Posey Rea. 01-14-11 Pt. 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 Pt. Aware to use Mupirocin as instructed.

## 2011-01-15 ENCOUNTER — Other Ambulatory Visit: Payer: Self-pay | Admitting: Orthopedic Surgery

## 2011-01-15 NOTE — H&P (Signed)
  Johnny [name] DOB: 12/11/1945  Date of Admission:  01/24/2011  Chief Complaint:  Left Hip Pain  History of Present Illness The patient is a 65 year old male who comes in today for a preoperative History and Physical. The patient is scheduled for a left total hip arthroplasty to be performed by Dr. Frank V. [name], MD at  Hospital on 01/24/2011.  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 03/12/1998 Pain in limb (729.5). 03/12/1998 Lumbago (724.2). 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 Deceased. Mother. Deceased, Cerebrovascular Accident. Smoker, Age 72  Social History Marital status. Married. Tobacco use. Former smoker. Children. 3 Current work status. Retired. Post-Surgical Plans. Plan for Home Advance Directives. None Current occupation. Minister  Past Surgical History Coronary Artery Bypass, Two. Date: 02/1998. Cataract Extraction-Right. Date: 02/2010. Cardiac Cath. Date: 01/1998. Penile Prosthesis. Date: 02/2010.  Vitals(DREW L Denzel Etienne, PA-C; 01/04/2011 11:13 AM) 01/04/2011 11:10 AM Weight: 244 lb Height: 70 in Body Surface Area: 2.34 m Body Mass Index: 35.01 kg/m Pulse: 60 (Regular) Resp.: 12 (Unlabored) BP:  132/72 (Sitting, Left Arm, Standard)  Exam GENERAL: Patient is a 65 y.o. male, well-nourished, well-developed, no acute distress. Alert, oriented, cooperative.  Good historian. HENT:  Normocephalic, atraumatic. Pupils round and reactive. EOMs intact. Glasses. Partial upper and lower dentures. NECK:  Supple, no bruits. CHEST:  Clear to anterior and posterior chest walls. No rhonchi, rales, wheezes. HEART:  Regular, rate and rhythm.  Faint systolic murmur along left sternal border.  S1 and S2 noted. ABDOMEN:  Soft, nontender, bowel sounds present. Slightly round. RECTAL/BREAST/GENITALIA:  Not done, not pertinent to present illness. EXTREMITIES:  Left Hip - limited internal and external rotation, motor intact,    Assessment & Plan(DREW L Natesha Hassey, PA-C; 01/04/2011 11:10 AM) Osteoarthritis, Hip (715.35) Impression: Left  Plan is a Left Total Hip Arthroplasty. Risks and benefits of the surgery have been discussed with the patient and they elect to proceed with surgery.  There are on active contraindications to upcoming procedure such as ongoing infection or progressive neurological disease. 

## 2011-01-23 MED ORDER — BUPIVACAINE 0.25 % ON-Q PUMP SINGLE CATH 300ML
300.0000 mL | INJECTION | Status: DC
  Filled 2011-01-23: qty 300

## 2011-01-24 ENCOUNTER — Encounter (HOSPITAL_COMMUNITY): Payer: Self-pay | Admitting: Anesthesiology

## 2011-01-24 ENCOUNTER — Inpatient Hospital Stay (HOSPITAL_COMMUNITY): Payer: Medicare Other

## 2011-01-24 ENCOUNTER — Inpatient Hospital Stay (HOSPITAL_COMMUNITY)
Admission: RE | Admit: 2011-01-24 | Discharge: 2011-01-27 | DRG: 470 | Disposition: A | Discharge status: Home Health | Payer: Medicare Other | Source: Ambulatory Visit | Attending: Orthopedic Surgery | Admitting: Orthopedic Surgery

## 2011-01-24 ENCOUNTER — Encounter (HOSPITAL_COMMUNITY): Payer: Self-pay | Admitting: *Deleted

## 2011-01-24 ENCOUNTER — Inpatient Hospital Stay (HOSPITAL_COMMUNITY): Payer: Medicare Other | Admitting: Anesthesiology

## 2011-01-24 ENCOUNTER — Encounter (HOSPITAL_COMMUNITY)
Admission: RE | Disposition: A | Discharge status: Home Health | Payer: Self-pay | Source: Ambulatory Visit | Attending: Orthopedic Surgery

## 2011-01-24 DIAGNOSIS — I2510 Atherosclerotic heart disease of native coronary artery without angina pectoris: Secondary | ICD-10-CM | POA: Diagnosis present

## 2011-01-24 DIAGNOSIS — E7800 Pure hypercholesterolemia, unspecified: Secondary | ICD-10-CM | POA: Diagnosis present

## 2011-01-24 DIAGNOSIS — K219 Gastro-esophageal reflux disease without esophagitis: Secondary | ICD-10-CM | POA: Diagnosis present

## 2011-01-24 DIAGNOSIS — Z951 Presence of aortocoronary bypass graft: Secondary | ICD-10-CM

## 2011-01-24 DIAGNOSIS — E119 Type 2 diabetes mellitus without complications: Secondary | ICD-10-CM | POA: Diagnosis present

## 2011-01-24 DIAGNOSIS — N138 Other obstructive and reflux uropathy: Secondary | ICD-10-CM | POA: Diagnosis present

## 2011-01-24 DIAGNOSIS — M169 Osteoarthritis of hip, unspecified: Principal | ICD-10-CM | POA: Diagnosis present

## 2011-01-24 DIAGNOSIS — M1610 Unilateral primary osteoarthritis, unspecified hip: Principal | ICD-10-CM | POA: Diagnosis present

## 2011-01-24 DIAGNOSIS — Z7982 Long term (current) use of aspirin: Secondary | ICD-10-CM

## 2011-01-24 DIAGNOSIS — I259 Chronic ischemic heart disease, unspecified: Secondary | ICD-10-CM

## 2011-01-24 DIAGNOSIS — Z79899 Other long term (current) drug therapy: Secondary | ICD-10-CM

## 2011-01-24 DIAGNOSIS — Z8249 Family history of ischemic heart disease and other diseases of the circulatory system: Secondary | ICD-10-CM

## 2011-01-24 DIAGNOSIS — N401 Enlarged prostate with lower urinary tract symptoms: Secondary | ICD-10-CM | POA: Diagnosis present

## 2011-01-24 DIAGNOSIS — Z01812 Encounter for preprocedural laboratory examination: Secondary | ICD-10-CM

## 2011-01-24 DIAGNOSIS — I10 Essential (primary) hypertension: Secondary | ICD-10-CM | POA: Diagnosis present

## 2011-01-24 DIAGNOSIS — M1612 Unilateral primary osteoarthritis, left hip: Secondary | ICD-10-CM

## 2011-01-24 HISTORY — PX: TOTAL HIP ARTHROPLASTY: SHX124

## 2011-01-24 LAB — GLUCOSE, CAPILLARY
Glucose-Capillary: 158 mg/dL — ABNORMAL HIGH (ref 70–99)
Glucose-Capillary: 235 mg/dL — ABNORMAL HIGH (ref 70–99)

## 2011-01-24 SURGERY — ARTHROPLASTY, HIP, TOTAL,POSTERIOR APPROACH
Anesthesia: General | Site: Hip | Laterality: Left

## 2011-01-24 MED ORDER — LACTATED RINGERS IV SOLN: INTRAVENOUS | Status: DC

## 2011-01-24 MED ORDER — METOCLOPRAMIDE HCL 10 MG PO TABS: 5.0000 mg | ORAL_TABLET | Freq: Three times a day (TID) | ORAL | Status: DC | PRN

## 2011-01-24 MED ORDER — GLIMEPIRIDE 4 MG PO TABS
4.0000 mg | ORAL_TABLET | ORAL | Status: DC
  Administered 2011-01-24 – 2011-01-26 (×3): 4 mg via ORAL
  Filled 2011-01-24 (×4): qty 1

## 2011-01-24 MED ORDER — ACETAMINOPHEN 325 MG PO TABS: 650.0000 mg | ORAL_TABLET | Freq: Four times a day (QID) | ORAL | Status: DC | PRN

## 2011-01-24 MED ORDER — SIMVASTATIN 10 MG PO TABS
10.0000 mg | ORAL_TABLET | Freq: Every day | ORAL | Status: DC
  Administered 2011-01-24 – 2011-01-26 (×3): 10 mg via ORAL
  Filled 2011-01-24 (×4): qty 1

## 2011-01-24 MED ORDER — ACETAMINOPHEN 10 MG/ML IV SOLN
1000.0000 mg | Freq: Once | INTRAVENOUS | Status: AC
  Administered 2011-01-24: 1000 mg via INTRAVENOUS

## 2011-01-24 MED ORDER — DOCUSATE SODIUM 100 MG PO CAPS
100.0000 mg | ORAL_CAPSULE | Freq: Two times a day (BID) | ORAL | Status: DC
  Administered 2011-01-24 – 2011-01-27 (×6): 100 mg via ORAL
  Filled 2011-01-24 (×7): qty 1

## 2011-01-24 MED ORDER — BISACODYL 10 MG RE SUPP: 10.0000 mg | Freq: Every day | RECTAL | Status: DC | PRN

## 2011-01-24 MED ORDER — DOXAZOSIN MESYLATE 8 MG PO TABS
8.0000 mg | ORAL_TABLET | Freq: Every day | ORAL | Status: DC
  Administered 2011-01-24 – 2011-01-27 (×4): 8 mg via ORAL
  Filled 2011-01-24 (×5): qty 1

## 2011-01-24 MED ORDER — POLYETHYLENE GLYCOL 3350 17 G PO PACK
17.0000 g | PACK | Freq: Every day | ORAL | Status: DC | PRN
  Filled 2011-01-24: qty 1

## 2011-01-24 MED ORDER — TEMAZEPAM 15 MG PO CAPS: 15.0000 mg | ORAL_CAPSULE | Freq: Every evening | ORAL | Status: DC | PRN

## 2011-01-24 MED ORDER — SCOPOLAMINE 1 MG/3DAYS TD PT72
MEDICATED_PATCH | TRANSDERMAL | Status: DC | PRN
  Administered 2011-01-24: 1.5 mg via TRANSDERMAL

## 2011-01-24 MED ORDER — CEFAZOLIN SODIUM 1-5 GM-% IV SOLN
1.0000 g | Freq: Four times a day (QID) | INTRAVENOUS | Status: AC
  Administered 2011-01-24 – 2011-01-25 (×3): 1 g via INTRAVENOUS
  Filled 2011-01-24 (×3): qty 50

## 2011-01-24 MED ORDER — TRAMADOL HCL 50 MG PO TABS: 50.0000 mg | ORAL_TABLET | Freq: Two times a day (BID) | ORAL | Status: DC | PRN

## 2011-01-24 MED ORDER — METHOCARBAMOL 100 MG/ML IJ SOLN
500.0000 mg | Freq: Four times a day (QID) | INTRAVENOUS | Status: DC | PRN
  Filled 2011-01-24: qty 5

## 2011-01-24 MED ORDER — METOCLOPRAMIDE HCL 5 MG/ML IJ SOLN: 5.0000 mg | Freq: Three times a day (TID) | INTRAMUSCULAR | Status: DC | PRN

## 2011-01-24 MED ORDER — LIDOCAINE HCL (CARDIAC) 20 MG/ML IV SOLN
INTRAVENOUS | Status: DC | PRN
  Administered 2011-01-24: 100 mg via INTRAVENOUS

## 2011-01-24 MED ORDER — HYDROMORPHONE HCL PF 1 MG/ML IJ SOLN
0.2500 mg | INTRAMUSCULAR | Status: DC | PRN
  Administered 2011-01-24 (×4): 0.5 mg via INTRAVENOUS

## 2011-01-24 MED ORDER — SUCCINYLCHOLINE CHLORIDE 20 MG/ML IJ SOLN
INTRAMUSCULAR | Status: DC | PRN
  Administered 2011-01-24: 100 mg via INTRAVENOUS

## 2011-01-24 MED ORDER — MENTHOL 3 MG MT LOZG: 1.0000 | LOZENGE | OROMUCOSAL | Status: DC | PRN

## 2011-01-24 MED ORDER — METFORMIN HCL 500 MG PO TABS
500.0000 mg | ORAL_TABLET | Freq: Every day | ORAL | Status: DC
  Administered 2011-01-25 – 2011-01-27 (×3): 500 mg via ORAL
  Filled 2011-01-24 (×3): qty 1

## 2011-01-24 MED ORDER — HYDROMORPHONE HCL PF 1 MG/ML IJ SOLN: 0.2500 mg | INTRAMUSCULAR | Status: DC | PRN

## 2011-01-24 MED ORDER — METHOCARBAMOL 500 MG PO TABS: 500.0000 mg | ORAL_TABLET | Freq: Four times a day (QID) | ORAL | Status: DC | PRN

## 2011-01-24 MED ORDER — MAGNESIUM HYDROXIDE 400 MG/5ML PO SUSP: 30.0000 mL | Freq: Two times a day (BID) | ORAL | Status: DC | PRN

## 2011-01-24 MED ORDER — OMEPRAZOLE 20 MG PO CPDR
20.0000 mg | DELAYED_RELEASE_CAPSULE | Freq: Every day | ORAL | Status: DC
  Administered 2011-01-24 – 2011-01-27 (×4): 20 mg via ORAL
  Filled 2011-01-24 (×4): qty 1

## 2011-01-24 MED ORDER — PHENOL 1.4 % MT LIQD: 1.0000 | OROMUCOSAL | Status: DC | PRN

## 2011-01-24 MED ORDER — ONDANSETRON HCL 4 MG PO TABS: 4.0000 mg | ORAL_TABLET | Freq: Four times a day (QID) | ORAL | Status: DC | PRN

## 2011-01-24 MED ORDER — ONDANSETRON HCL 4 MG/2ML IJ SOLN: 4.0000 mg | Freq: Four times a day (QID) | INTRAMUSCULAR | Status: DC | PRN

## 2011-01-24 MED ORDER — OXYCODONE HCL 5 MG PO TABS
5.0000 mg | ORAL_TABLET | ORAL | Status: DC | PRN
  Administered 2011-01-27: 5 mg via ORAL
  Filled 2011-01-24: qty 2

## 2011-01-24 MED ORDER — PANTOPRAZOLE SODIUM 40 MG PO TBEC
80.0000 mg | DELAYED_RELEASE_TABLET | Freq: Every day | ORAL | Status: DC
  Administered 2011-01-24 – 2011-01-25 (×2): 80 mg via ORAL
  Filled 2011-01-24 (×3): qty 2

## 2011-01-24 MED ORDER — BISACODYL 5 MG PO TBEC: 10.0000 mg | DELAYED_RELEASE_TABLET | Freq: Every day | ORAL | Status: DC | PRN

## 2011-01-24 MED ORDER — PROMETHAZINE HCL 25 MG/ML IJ SOLN
6.2500 mg | INTRAMUSCULAR | Status: DC | PRN
  Administered 2011-01-24: 6.25 mg via INTRAVENOUS

## 2011-01-24 MED ORDER — ACETAMINOPHEN 650 MG RE SUPP: 650.0000 mg | Freq: Four times a day (QID) | RECTAL | Status: DC | PRN

## 2011-01-24 MED ORDER — PROPOFOL 10 MG/ML IV EMUL
INTRAVENOUS | Status: DC | PRN
  Administered 2011-01-24: 200 mg via INTRAVENOUS

## 2011-01-24 MED ORDER — FENTANYL CITRATE 0.05 MG/ML IJ SOLN
1.0000 ug/kg | INTRAMUSCULAR | Status: DC | PRN
  Administered 2011-01-24 (×2): 50 ug via INTRAVENOUS

## 2011-01-24 MED ORDER — ACETAMINOPHEN 10 MG/ML IV SOLN
1000.0000 mg | Freq: Four times a day (QID) | INTRAVENOUS | Status: AC
  Administered 2011-01-24 – 2011-01-26 (×4): 1000 mg via INTRAVENOUS
  Filled 2011-01-24 (×10): qty 100

## 2011-01-24 MED ORDER — SODIUM CHLORIDE 0.9 % IJ SOLN
INTRAMUSCULAR | Status: DC | PRN
  Administered 2011-01-24: 50 mL via INTRAVENOUS

## 2011-01-24 MED ORDER — NEOSTIGMINE METHYLSULFATE 1 MG/ML IJ SOLN
INTRAMUSCULAR | Status: DC | PRN
  Administered 2011-01-24: 4 mg via INTRAVENOUS

## 2011-01-24 MED ORDER — MORPHINE SULFATE 2 MG/ML IJ SOLN
INTRAMUSCULAR | Status: AC
  Administered 2011-01-24: 2 mg via INTRAVENOUS
  Filled 2011-01-24: qty 1

## 2011-01-24 MED ORDER — INSULIN ASPART 100 UNIT/ML ~~LOC~~ SOLN
0.0000 [IU] | Freq: Three times a day (TID) | SUBCUTANEOUS | Status: DC
  Administered 2011-01-25 (×2): 2 [IU] via SUBCUTANEOUS
  Administered 2011-01-25: 3 [IU] via SUBCUTANEOUS
  Administered 2011-01-26: 2 [IU] via SUBCUTANEOUS
  Administered 2011-01-26 (×2): 3 [IU] via SUBCUTANEOUS
  Filled 2011-01-24: qty 3

## 2011-01-24 MED ORDER — DEXAMETHASONE SODIUM PHOSPHATE 10 MG/ML IJ SOLN: 10.0000 mg | Freq: Once | INTRAMUSCULAR | Status: DC

## 2011-01-24 MED ORDER — MORPHINE SULFATE 2 MG/ML IJ SOLN
1.0000 mg | INTRAMUSCULAR | Status: DC | PRN
  Administered 2011-01-24 – 2011-01-26 (×7): 2 mg via INTRAVENOUS
  Filled 2011-01-24 (×6): qty 1

## 2011-01-24 MED ORDER — FENTANYL CITRATE 0.05 MG/ML IJ SOLN
INTRAMUSCULAR | Status: DC | PRN
  Administered 2011-01-24: 100 ug via INTRAVENOUS
  Administered 2011-01-24: 50 ug via INTRAVENOUS
  Administered 2011-01-24: 100 ug via INTRAVENOUS

## 2011-01-24 MED ORDER — NITROGLYCERIN 0.4 MG SL SUBL: 0.4000 mg | SUBLINGUAL_TABLET | SUBLINGUAL | Status: DC | PRN

## 2011-01-24 MED ORDER — BUPIVACAINE LIPOSOME 1.3 % IJ SUSP
20.0000 mL | Freq: Once | INTRAMUSCULAR | Status: AC
  Administered 2011-01-24: 20 mL
  Filled 2011-01-24: qty 20

## 2011-01-24 MED ORDER — SODIUM CHLORIDE 0.9 % IV SOLN
INTRAVENOUS | Status: DC
  Administered 2011-01-24: 23:00:00 via INTRAVENOUS

## 2011-01-24 MED ORDER — CEFAZOLIN SODIUM-DEXTROSE 2-3 GM-% IV SOLR
2.0000 g | Freq: Once | INTRAVENOUS | Status: AC
  Administered 2011-01-24: 2 g via INTRAVENOUS

## 2011-01-24 MED ORDER — RIVAROXABAN 10 MG PO TABS
10.0000 mg | ORAL_TABLET | ORAL | Status: DC
  Administered 2011-01-25 – 2011-01-27 (×3): 10 mg via ORAL
  Filled 2011-01-24 (×3): qty 1

## 2011-01-24 MED ORDER — LACTATED RINGERS IV SOLN
INTRAVENOUS | Status: DC | PRN
  Administered 2011-01-24 (×2): via INTRAVENOUS

## 2011-01-24 MED ORDER — GLYCOPYRROLATE 0.2 MG/ML IJ SOLN
INTRAMUSCULAR | Status: DC | PRN
  Administered 2011-01-24: .06 mg via INTRAVENOUS

## 2011-01-24 MED ORDER — FLEET ENEMA 7-19 GM/118ML RE ENEM: 1.0000 | ENEMA | Freq: Every day | RECTAL | Status: DC | PRN

## 2011-01-24 MED ORDER — DIPHENHYDRAMINE HCL 12.5 MG/5ML PO ELIX: 12.5000 mg | ORAL_SOLUTION | ORAL | Status: DC | PRN

## 2011-01-24 MED ORDER — ROCURONIUM BROMIDE 100 MG/10ML IV SOLN
INTRAVENOUS | Status: DC | PRN
  Administered 2011-01-24: 5 mg via INTRAVENOUS
  Administered 2011-01-24: 45 mg via INTRAVENOUS

## 2011-01-24 MED ORDER — ENALAPRIL MALEATE 10 MG PO TABS
10.0000 mg | ORAL_TABLET | Freq: Every day | ORAL | Status: DC
  Administered 2011-01-24 – 2011-01-27 (×4): 10 mg via ORAL
  Filled 2011-01-24 (×5): qty 1

## 2011-01-24 MED ORDER — NON FORMULARY: 20.0000 mg | Freq: Every day | Status: DC

## 2011-01-24 MED ORDER — DEXAMETHASONE SODIUM PHOSPHATE 10 MG/ML IJ SOLN
INTRAMUSCULAR | Status: DC | PRN
  Administered 2011-01-24: 10 mg via INTRAVENOUS

## 2011-01-24 MED ORDER — ONDANSETRON HCL 4 MG/2ML IJ SOLN
INTRAMUSCULAR | Status: DC | PRN
  Administered 2011-01-24: 4 mg via INTRAVENOUS

## 2011-01-24 SURGICAL SUPPLY — 48 items
BAG ZIPLOCK 12X15 (MISCELLANEOUS) ×2 IMPLANT
BIT DRILL 2.8X128 (BIT) ×2 IMPLANT
BLADE EXTENDED COATED 6.5IN (ELECTRODE) ×2 IMPLANT
BLADE SAW SAG 73X25 THK (BLADE) ×1
BLADE SAW SGTL 73X25 THK (BLADE) ×1 IMPLANT
CATH KIT ON-Q SILVERSOAK 5IN (CATHETERS) IMPLANT
CLOSURE STERI STRIP 1/2 X4 (GAUZE/BANDAGES/DRESSINGS) ×2 IMPLANT
CLOTH BEACON ORANGE TIMEOUT ST (SAFETY) ×2 IMPLANT
DRAPE INCISE IOBAN 66X45 STRL (DRAPES) ×2 IMPLANT
DRAPE ORTHO SPLIT 77X108 STRL (DRAPES) ×2
DRAPE POUCH INSTRU U-SHP 10X18 (DRAPES) ×2 IMPLANT
DRAPE SURG ORHT 6 SPLT 77X108 (DRAPES) ×2 IMPLANT
DRAPE U-SHAPE 47X51 STRL (DRAPES) ×2 IMPLANT
DRSG ADAPTIC 3X8 NADH LF (GAUZE/BANDAGES/DRESSINGS) ×2 IMPLANT
DRSG MEPILEX BORDER 4X4 (GAUZE/BANDAGES/DRESSINGS) ×2 IMPLANT
DRSG MEPILEX BORDER 4X8 (GAUZE/BANDAGES/DRESSINGS) ×2 IMPLANT
DURAPREP 26ML APPLICATOR (WOUND CARE) ×2 IMPLANT
ELECT REM PT RETURN 9FT ADLT (ELECTROSURGICAL) ×2
ELECTRODE REM PT RTRN 9FT ADLT (ELECTROSURGICAL) ×1 IMPLANT
EVACUATOR 1/8 PVC DRAIN (DRAIN) ×2 IMPLANT
FACESHIELD LNG OPTICON STERILE (SAFETY) ×8 IMPLANT
GAUZE SPONGE 4X4 12PLY STRL LF (GAUZE/BANDAGES/DRESSINGS) ×2 IMPLANT
GLOVE BIO SURGEON STRL SZ7.5 (GLOVE) ×2 IMPLANT
GLOVE BIO SURGEON STRL SZ8 (GLOVE) ×2 IMPLANT
GLOVE BIOGEL PI IND STRL 8 (GLOVE) ×2 IMPLANT
GLOVE BIOGEL PI INDICATOR 8 (GLOVE) ×2
GLOVE SURG SS PI 7.5 STRL IVOR (GLOVE) ×8 IMPLANT
GOWN PREVENTION PLUS XLARGE (GOWN DISPOSABLE) ×2 IMPLANT
GOWN STRL REIN XL XLG (GOWN DISPOSABLE) ×2 IMPLANT
IMMOBILIZER KNEE 20 (SOFTGOODS)
IMMOBILIZER KNEE 20 THIGH 36 (SOFTGOODS) IMPLANT
KIT BASIN OR (CUSTOM PROCEDURE TRAY) ×2 IMPLANT
MANIFOLD NEPTUNE II (INSTRUMENTS) ×2 IMPLANT
NS IRRIG 1000ML POUR BTL (IV SOLUTION) ×2 IMPLANT
PACK TOTAL JOINT (CUSTOM PROCEDURE TRAY) ×2 IMPLANT
PASSER SUT SWANSON 36MM LOOP (INSTRUMENTS) ×2 IMPLANT
POSITIONER SURGICAL ARM (MISCELLANEOUS) ×2 IMPLANT
SPONGE GAUZE 4X4 12PLY (GAUZE/BANDAGES/DRESSINGS) ×2 IMPLANT
STRIP CLOSURE SKIN 1/2X4 (GAUZE/BANDAGES/DRESSINGS) ×4 IMPLANT
SUT ETHIBOND NAB CT1 #1 30IN (SUTURE) ×4 IMPLANT
SUT MNCRL AB 4-0 PS2 18 (SUTURE) ×2 IMPLANT
SUT VIC AB 1 CT1 27 (SUTURE) ×3
SUT VIC AB 1 CT1 27XBRD ANTBC (SUTURE) ×3 IMPLANT
SUT VIC AB 2-0 CT1 27 (SUTURE) ×3
SUT VIC AB 2-0 CT1 TAPERPNT 27 (SUTURE) ×3 IMPLANT
TOWEL OR 17X26 10 PK STRL BLUE (TOWEL DISPOSABLE) ×4 IMPLANT
TRAY FOLEY CATH 14FRSI W/METER (CATHETERS) ×2 IMPLANT
WATER STERILE IRR 1500ML POUR (IV SOLUTION) ×2 IMPLANT

## 2011-01-24 NOTE — Transfer of Care (Signed)
Immediate Anesthesia Transfer of Care Note  Patient: Johnny [name]  Procedure(s) Performed:  TOTAL HIP ARTHROPLASTY  Patient Location: PACU  Anesthesia Type: General  Level of Consciousness: awake and alert   Airway & Oxygen Therapy: Patient Spontanous Breathing and Patient connected to face mask oxygen  Post-op Assessment: Report given to PACU RN  Post vital signs: Reviewed and stable  Complications: No apparent anesthesia complications

## 2011-01-24 NOTE — Anesthesia Preprocedure Evaluation (Addendum)
Anesthesia Evaluation  Patient identified by MRN, date of birth, ID band Patient awake    Reviewed: Allergy & Precautions, H&P , NPO status , Patient's Chart, lab work & pertinent test results  Airway Mallampati: II TM Distance: >3 FB Neck ROM: Full    Dental No notable dental hx.    Pulmonary neg pulmonary ROS,  clear to auscultation  Pulmonary exam normal       Cardiovascular hypertension, + CAD and neg cardio ROS Regular Normal    Neuro/Psych Negative Neurological ROS  Negative Psych ROS   GI/Hepatic negative GI ROS, Neg liver ROS, GERD-  ,  Endo/Other  Negative Endocrine ROSDiabetes mellitus-, Well Controlled  Renal/GU negative Renal ROS  Genitourinary negative   Musculoskeletal negative musculoskeletal ROS (+)   Abdominal   Peds negative pediatric ROS (+)  Hematology negative hematology ROS (+)   Anesthesia Other Findings   Reproductive/Obstetrics negative OB ROS                          Anesthesia Physical Anesthesia Plan  ASA: III  Anesthesia Plan: General   Post-op Pain Management:    Induction: Intravenous  Airway Management Planned: Oral ETT  Additional Equipment:   Intra-op Plan:   Post-operative Plan: Extubation in OR  Informed Consent: I have reviewed the patients History and Physical, chart, labs and discussed the procedure including the risks, benefits and alternatives for the proposed anesthesia with the patient or authorized representative who has indicated his/her understanding and acceptance.   Dental advisory given  Plan Discussed with: CRNA  Anesthesia Plan Comments:         Anesthesia Quick Evaluation

## 2011-01-24 NOTE — Anesthesia Postprocedure Evaluation (Signed)
  Anesthesia Post-op Note  Patient: Johnny [name]  Procedure(s) Performed:  TOTAL HIP ARTHROPLASTY  Patient Location: PACU  Anesthesia Type: General  Level of Consciousness: awake and alert   Airway and Oxygen Therapy: Patient Spontanous Breathing  Post-op Pain: mild  Post-op Assessment: Post-op Vital signs reviewed, Patient's Cardiovascular Status Stable, Respiratory Function Stable, Patent Airway and No signs of Nausea or vomiting  Post-op Vital Signs: stable  Complications: No apparent anesthesia complications

## 2011-01-24 NOTE — Preoperative (Signed)
Beta Blockers   Reason not to administer Beta Blockers:Not Applicable 

## 2011-01-24 NOTE — H&P (View-Only) (Signed)
  Johnny [name] DOB: 05-Dec-1945  Date of Admission:  01/24/2011  Chief Complaint:  Left Hip Pain  History of Present Illness The patient is a 65 year old male who comes in today for a preoperative History and Physical. The patient is scheduled for a left total hip arthroplasty to be performed by Dr. Gus Rankin. [name], MD at [facility] on 01/24/2011.  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 03/12/1998 Pain in limb (729.5). 03/12/1998 Lumbago (724.2). 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 Deceased. Mother. Deceased, Cerebrovascular Accident. Smoker, Age 68  Social History Marital status. Married. Tobacco use. Former smoker. Children. 3 Current work status. Retired. Post-Surgical Plans. Plan for Home Advance Directives. None Current occupation. Minister  Past Surgical History Coronary Artery Bypass, Two. Date: 02/1998. Cataract Extraction-Right. Date: 02/2010. Cardiac Cath. Date: 01/1998. Penile Prosthesis. Date: 02/2010.  Vitals(DREW L PERKINS, PA-C; 01/04/2011 11:13 AM) 01/04/2011 11:10 AM Weight: 244 lb Height: 70 in Body Surface Area: 2.34 m Body Mass Index: 35.01 kg/m Pulse: 60 (Regular) Resp.: 12 (Unlabored) BP:  132/72 (Sitting, Left Arm, Standard)  Exam GENERAL: Patient is a 65 y.o. male, well-nourished, well-developed, no acute distress. Alert, oriented, cooperative.  Good historian. HENT:  Normocephalic, atraumatic. Pupils round and reactive. EOMs intact. Glasses. Partial upper and lower dentures. NECK:  Supple, no bruits. CHEST:  Clear to anterior and posterior chest walls. No rhonchi, rales, wheezes. HEART:  Regular, rate and rhythm.  Faint systolic murmur along left sternal border.  S1 and S2 noted. ABDOMEN:  Soft, nontender, bowel sounds present. Slightly round. RECTAL/BREAST/GENITALIA:  Not done, not pertinent to present illness. EXTREMITIES:  Left Hip - limited internal and external rotation, motor intact,    Assessment & Plan(DREW L PERKINS, PA-C; 01/04/2011 11:10 AM) Osteoarthritis, Hip (715.35) Impression: Left  Plan is a Left Total Hip Arthroplasty. Risks and benefits of the surgery have been discussed with the patient and they elect to proceed with surgery.  There are on active contraindications to upcoming procedure such as ongoing infection or progressive neurological disease.

## 2011-01-24 NOTE — Op Note (Signed)
Pre-operative diagnosis- Osteoarthritis Left hip  Post-operative diagnosis- Osteoarthritis  Left hip  Procedure-  LeftTotal Hip Arthroplasty  Surgeon- Johnny [name]. Masashi Snowdon, MD  Assistant- Avel Peace, PA-C   Anesthesia  General  EBL- 300   Drain Hemovac   Complication- None  Condition-PACU - hemodynamically stable.   Brief Clinical Note-  Johnny [name] is a 65 y.o. male with end stage arthritis of his left hip with progressively worsening pain and dysfunction. Pain occurs with activity and rest including pain at night. He has tried analgesics, protected weight bearing and rest without benefit. Pain is too severe to attempt physical therapy. Radiographs demonstrate bone on bone arthritis with subchondral cyst formation. He presents now for left THA  Procedure in detail-   The patient is brought into the operating room and placed on the operating table. After successful administration of General   anesthesia, the patient is placed in the  Right lateral decubitus position with the  Left side up and held in place with the hip positioner. The lower extremity is isolated from the perineum with plastic drapes and time-out is performed by the surgical team. The lower extremity is then prepped and draped in the usual sterile fashion. A short posterolateral incision is made with a ten blade through the subcutaneous tissue to the level of the fascia lata which is incised in line with the skin incision. The sciatic nerve is palpated and protected and the short external rotators and capsule are isolated from the femur. The hip is then dislocated and the center of the femoral head is marked. A trial prosthesis is placed such that the trial head corresponds to the center of the patients' native femoral head. The resection level is marked on the femoral neck and the resection is made with an oscillating saw. The femoral head is removed and femoral retractors placed to gain access to the femoral canal.  The canal finder is passed into the femoral canal and the canal is thoroughly irrigated with sterile saline to remove the fatty contents. Axial reaming is performed to 17.5  mm, proximal reaming to 23F   and the sleeve machined to a small. A 23F small trial sleeve is placed into the proximal femur.      The femur is then retracted anteriorly to gain acetabular exposure. Acetabular retractors are placed and the labrum and osteophytes are removed, Acetabular reaming is performed to 59  mm and a 60  mm Pinnacle acetabular shell is placed in anatomic position with excellent purchase. Additional dome screws were not needed. An apex hole eliminator is placed and the permanent 36 mm neutral plus 4 Marathon liner is placed into the acetabular shell.      The trial femur is then placed into the femoral canal. The size is 22 x 17  stem with a 36 + 12  neck and a 36 + 0 head with the neck version matching  the patients' native anteversion. The hip is reduced with excellent stability with full extension and full external rotation, 70 degrees flexion with 40 degrees adduction and 90 degrees internal rotation and 90 degrees of flexion with 70 degrees of internal rotation. The operative leg is placed on top of the non-operative leg and the leg lengths are found to be equal. The trials are then removed and the permanent implant of the same size is impacted into the femoral canal. The ceramic femoral head of the same size as the trial is placed and the hip is reduced with  the same stability parameters. The operative leg is again placed on top of the non-operative leg and the leg lengths are found to be equal.      The wound is then copiously irrigated with saline solution and the capsule and short external rotators are re-attached to the femur through drill holes with Ethibond suture. The fascia lata is closed over a hemovac drain with #1 vicryl suture and the fascia lata, gluteal muscles and subcutaneous tissues are injected  with Exparel 20ml diluted with saline 50ml. The subcutaneous tissues are closed with #1 and2-0 vicryl and the subcuticular layer closed with running 4-0 Monocryl. The drain is hooked to suction, incision cleaned and dried, and steri-srips and a bulky sterile dressing applied. The limb is placed into a knee immobilizer and the patient is awakened and transported to recovery in stable condition.      Please note that a surgical assistant was a medical necessity for this procedure in order to perform it in a safe and expeditious manner. The assistant was necessary to provide retraction to the vital neurovascular structures and to retract and position the limb to allow for anatomic placement of the prosthetic components.  Johnny [name] Johnny Pleitez, MD    01/24/2011, 4:11 PM

## 2011-01-24 NOTE — Interval H&P Note (Signed)
History and Physical Interval Note:   01/24/2011   2:31 PM   Johnny [name]  has presented today for surgery, with the diagnosis of Osteoarthritis Left Hip  The various methods of treatment have been discussed with the patient and family. After consideration of risks, benefits and other options for treatment, the patient has consented to  Procedure(s): TOTAL HIP ARTHROPLASTY as a surgical intervention .  The patients' history has been reviewed, patient examined, no change in status, stable for surgery.  I have reviewed the patients' chart and labs.  Questions were answered to the patient's satisfaction.     Loanne Drilling  MD  Pt examined. History and physical unchanged  Gus Rankin. Lonald Troiani, MD    01/24/2011, 2:32 PM

## 2011-01-25 LAB — GLUCOSE, CAPILLARY
Glucose-Capillary: 178 mg/dL — ABNORMAL HIGH (ref 70–99)
Glucose-Capillary: 135 mg/dL — ABNORMAL HIGH (ref 70–99)
Glucose-Capillary: 138 mg/dL — ABNORMAL HIGH (ref 70–99)

## 2011-01-25 LAB — CBC
HCT: 32.3 % — ABNORMAL LOW (ref 39.0–52.0)
MCH: 27.5 pg (ref 26.0–34.0)
MCV: 81.6 fL (ref 78.0–100.0)
Platelets: 260 10*3/uL (ref 150–400)
RBC: 3.96 MIL/uL — ABNORMAL LOW (ref 4.22–5.81)
WBC: 8.3 10*3/uL (ref 4.0–10.5)

## 2011-01-25 LAB — BASIC METABOLIC PANEL
BUN: 10 mg/dL (ref 6–23)
Chloride: 101 mEq/L (ref 96–112)
GFR calc Af Amer: >90 mL/min (ref >90)
Potassium: 4.2 mEq/L (ref 3.5–5.1)

## 2011-01-25 MED ORDER — MUPIROCIN 2 % EX OINT: 1.0000 "application " | TOPICAL_OINTMENT | Freq: Two times a day (BID) | CUTANEOUS | Status: DC

## 2011-01-25 MED ORDER — CHLORHEXIDINE GLUCONATE CLOTH 2 % EX PADS: 6.0000 | MEDICATED_PAD | Freq: Every day | CUTANEOUS | Status: DC

## 2011-01-25 NOTE — Progress Notes (Signed)
Physical Therapy Treatment Patient Details Name: Johnny [name] MRN: [id number] DOB: 09-26-1945 Today's Date: 01/25/2011 1340-1405 1Gt, 1Ta  PT Assessment/Plan  PT - Assessment/Plan Comments on Treatment Session: Patient with light headedness and nausea with ambulation this pm.  Unable to check vitals due to machines in rooms with pts recieveing blood per nurse tech and no manual BP cuff in room.  Patient with symptoms improved in supine, still slightly nauseous with RN made aware. PT Plan: Discharge plan remains appropriate PT Frequency: 7X/week Follow Up Recommendations: Home health PT Equipment Recommended: Rolling walker with 5" wheels PT Goals  Acute Rehab PT Goals PT Goal: Sit to Supine/Side - Progress: Progressing toward goal PT Transfer Goal: Sit to Stand/Stand to Sit - Progress: Progressing toward goal PT Goal: Ambulate - Progress: Other (comment) (no progress due to nausea, light headed) Additional Goals PT Goal: Additional Goal #1 - Progress: Progressing toward goal  PT Treatment Precautions/Restrictions  Precautions Precautions: Posterior Hip Precaution Comments: initiated verbal education Restrictions Weight Bearing Restrictions: Yes LLE Weight Bearing: Partial weight bearing LLE Partial Weight Bearing Percentage or Pounds: 25-50% Mobility (including Balance) Bed Mobility Sit to Supine - Left: 3: Mod assist Sit to Supine - Left Details (indicate cue type and reason): cues for technique and assist for LE's, positioning in bed Transfers Sit to Stand: 3: Mod assist;From chair/3-in-1 Sit to Stand Details (indicate cue type and reason): cues for technique, scoot to edge and use armrests Stand to Sit: 4: Min assist;To chair/3-in-1;To bed Stand to Sit Details: to chair, then to bed with cues for technique Ambulation/Gait Ambulation/Gait Assistance: 4: Min assist Ambulation/Gait Assistance Details (indicate cue type and reason): cues for sequence, PWB Ambulation  Distance (Feet): 10 Feet (Limited by dizziness, nausea this pm) Assistive device: Rolling walker Gait Pattern: Decreased step length - right;Antalgic    Exercise    End of Session PT - End of Session Equipment Utilized During Treatment: Gait belt Activity Tolerance: Patient limited by fatigue (limited by nausea, dizziness) Patient left: in bed General Behavior During Session: [facility] for tasks performed Cognition: [facility] for tasks performed  [facility] 01/25/2011, 5:10 PM

## 2011-01-25 NOTE — Progress Notes (Signed)
Physical Therapy Evaluation Patient Details Name: ROBBIE RIDEAUX MRN: [id number] DOB: 08/31/1945 Today's Date: 01/25/2011 [id number] Ev2  Problem List:  Patient Active Problem List  Diagnoses  . WART, VIRAL  . DIABETES MELLITUS, TYPE II  . HYPERLIPIDEMIA  . ERECTILE DYSFUNCTION  . HYPERTENSION  . CORONARY ARTERY DISEASE  . ISCHEMIC HEART DISEASE  . BRONCHITIS, ACUTE  . GERD  . BLADDER OUTLET OBSTRUCTION  . BALANITIS  . PRURITUS  . DEGENERATIVE JOINT DISEASE, LEFT HIP  . ARTHRITIS  . HIP PAIN  . COSTOCHONDRITIS  . CHEST PAIN  . BENIGN PROSTATIC HYPERTROPHY, HX OF  . TOBACCO USE, QUIT  . CORONARY ARTERY BYPASS GRAFT, THREE VESSEL, HX OF  . Preop exam for internal medicine    Past Medical History:  Past Medical History  Diagnosis Date  . Benign prostatic hypertrophy   . Arthritis   . Benign positional vertigo   . Chronic ischemic heart disease, unspecified   . Family history of ischemic heart disease   . Hyperlipidemia   . Esophageal reflux   . CAD (coronary artery disease)   . Observation for suspected malignant neoplasm   . Routine general medical examination at a health care facility   . Balanitis   . HTN (hypertension)   . ED (erectile dysfunction)   . Type II or unspecified type diabetes mellitus without mention of complication, not stated as uncontrolled 01-13-11    oral meds only   Past Surgical History:  Past Surgical History  Procedure Date  . Penile prosthesis implant 01-13-11    02-08-10  . 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 Cataract extraction 11-812    right eye only    PT Assessment/Plan/Recommendation PT Assessment Clinical Impression Statement: Patient POD 1 THA left LE presents with decreased sterength, decreased ROM, decreased activity tolerance, decreased knowledge of precautions and acute pain and will benefit from skilled PT to address these issues and allow for d/c home with HHPT and wife assist. PT  Recommendation/Assessment: Patient will need skilled PT in the acute care venue PT Problem List: Decreased range of motion;Decreased strength;Decreased knowledge of use of DME;Decreased knowledge of precautions;Decreased mobility;Decreased activity tolerance;Pain PT Therapy Diagnosis : Difficulty walking;Acute pain;Generalized weakness PT Plan PT Frequency: 7X/week PT Treatment/Interventions: Therapeutic exercise;Functional mobility training;Stair training;Gait training;DME instruction;Patient/family education PT Recommendation Follow Up Recommendations: Home health PT Equipment Recommended: Rolling walker with 5" wheels PT Goals  Acute Rehab PT Goals PT Goal Formulation: With patient Time For Goal Achievement: 5 days Pt will go Supine/Side to Sit: with supervision PT Goal: Supine/Side to Sit - Progress: Progressing toward goal Pt will go Sit to Supine/Side: with supervision PT Goal: Sit to Supine/Side - Progress: Progressing toward goal Pt will Transfer Sit to Stand/Stand to Sit: with supervision PT Transfer Goal: Sit to Stand/Stand to Sit - Progress: Progressing toward goal Pt will Ambulate: >150 feet;with rolling walker PT Goal: Ambulate - Progress: Progressing toward goal Pt will Go Up / Down Stairs: 3-5 stairs;with min assist;with rolling walker PT Goal: Up/Down Stairs - Progress: Progressing toward goal Pt will Perform Home Exercise Program: with supervision, verbal cues required/provided PT Goal: Perform Home Exercise Program - Progress: Progressing toward goal Additional Goals Additional Goal #1: Patient will verbalize 3/3 hip precautions independent. PT Goal: Additional Goal #1 - Progress: Progressing toward goal  PT Evaluation Precautions/Restrictions  Precautions Precautions: Posterior Hip Precaution Comments: initiated verbal education Restrictions Weight Bearing Restrictions: Yes LLE Weight Bearing: Partial weight bearing LLE  Partial Weight Bearing Percentage or  Pounds: 25-50% Prior Functioning  Home Living Lives With: Spouse Type of Home: House Home Layout: One level Home Access: Stairs to enter Entrance Stairs-Rails: None Entrance Stairs-Number of Steps: 3 Bathroom Shower/Tub: Tub/shower unit;Door (could remove door) Bathroom Toilet: Standard Home Adaptive Equipment: Straight cane Prior Function Level of Independence: Independent with gait;Independent with homemaking with ambulation;Independent with transfers (occasionally used cane) Driving: Yes Cognition Cognition Arousal/Alertness: Awake/alert Overall Cognitive Status: Appears within functional limits for tasks assessed Orientation Level: Oriented X4 Sensation/Coordination   Extremity Assessment RLE Assessment RLE Assessment: Within Functional Limits LLE Assessment LLE Assessment: Exceptions to [facility] LLE AROM (degrees) Left Hip Flexion 0-125: 45  (in supine (increased in sitting)) Left Knee Flexion 0-140: 45  (in supine (increased in sitting)) Left Ankle Dorsiflexion 0-20: 15  LLE Strength Left Knee Extension: 2+/5 Left Ankle Dorsiflexion: 4/5 Mobility (including Balance) Bed Mobility Bed Mobility: Yes Supine to Sit: 4: Min assist;HOB elevated (Comment degrees) Supine to Sit Details (indicate cue type and reason): HOB 30 degrees, assist for left LE and cues to relax to decrease pain and assist for lifting trunk Sitting - Scoot to Edge of Bed: 4: Min assist Sitting - Scoot to [place] of Bed Details (indicate cue type and reason): for scooting right hip out  Transfers Sit to Stand: 4: Min assist;From bed;With upper extremity assist Sit to Stand Details (indicate cue type and reason): cue for hand placement, cue for hip precautions and PWB Stand to Sit: With armrests;4: Min assist;To chair/3-in-1 Stand to Sit Details: cues for technique/ hand placement Ambulation/Gait Ambulation/Gait Assistance: 4: Min assist Ambulation/Gait Assistance Details (indicate cue type and reason): cues  for sequence, technique, PWB, hip precautions Ambulation Distance (Feet): 12 Feet Assistive device: Rolling walker Gait Pattern: Step-to pattern;Trunk flexed    Exercise  Total Joint Exercises Ankle Circles/Pumps: AROM;Both;10 reps;Supine Quad Sets: AROM;Both;10 reps;Supine Heel Slides: AROM;AAROM;Both;5 reps;Supine (AROM right, AAROM left) End of Session PT - End of Session Equipment Utilized During Treatment: Gait belt Activity Tolerance: Patient tolerated treatment well Patient left: in chair General Behavior During Session: [facility] for tasks performed Cognition: [facility] for tasks performed  [facility] 01/25/2011, 10:04 AM

## 2011-01-25 NOTE — Progress Notes (Signed)
Subjective: 1 Day Post-Op Procedure(s) (LRB): TOTAL HIP ARTHROPLASTY (Left) Patient reports pain as mild.   Patient seen in rounds with Dr. Lequita Halt. Patient is doing well this morning. Got some rest and has good appetite.  We will start therapy today. Plan is to go home after hospital stay.  Objective: Vital signs in last 24 hours: Temp:  [96.8 F (36 C)-99 F (37.2 C)] 98.4 F (36.9 C) (11/20 0522) Pulse Rate:  [48-75] 75  (11/20 0522) Resp:  [12-20] 18  (11/20 0522) BP: (123-181)/(44-97) 138/79 mmHg (11/20 0522) SpO2:  [95 %-100 %] 100 % (11/20 0522) Weight:  [107.956 kg (238 lb)] 238 lb (107.956 kg) (11/19 1915)  Intake/Output from previous day:  Intake/Output Summary (Last 24 hours) at 01/25/11 0723 Last data filed at 01/25/11 0600  Gross per 24 hour  Intake   4485 ml  Output   3675 ml  Net    810 ml    Intake/Output this shift:    Labs: Results for orders placed during the hospital encounter of 01/24/11  TYPE AND SCREEN      Component Value Range   ABO/RH(D) O POS     Antibody Screen NEG     Sample Expiration 01/27/2011    GLUCOSE, CAPILLARY      Component Value Range   Glucose-Capillary 94  70 - 99 (mg/dL)  ABO/RH      Component Value Range   ABO/RH(D) O POS    GLUCOSE, CAPILLARY      Component Value Range   Glucose-Capillary 93  70 - 99 (mg/dL)  GLUCOSE, CAPILLARY      Component Value Range   Glucose-Capillary 158 (*) 70 - 99 (mg/dL)   Comment 1 Documented in Chart     Comment 2 Notify RN    CBC      Component Value Range   WBC 8.3  4.0 - 10.5 (K/uL)   RBC 3.96 (*) 4.22 - 5.81 (MIL/uL)   Hemoglobin 10.9 (*) 13.0 - 17.0 (g/dL)   HCT 16.1 (*) 09.6 - 52.0 (%)   MCV 81.6  78.0 - 100.0 (fL)   MCH 27.5  26.0 - 34.0 (pg)   MCHC 33.7  30.0 - 36.0 (g/dL)   RDW 04.5  40.9 - 81.1 (%)   Platelets 260  150 - 400 (K/uL)  BASIC METABOLIC PANEL      Component Value Range   Sodium 135  135 - 145 (mEq/L)   Potassium 4.2  3.5 - 5.1 (mEq/L)   Chloride 101  96 -  112 (mEq/L)   CO2 26  19 - 32 (mEq/L)   Glucose, Bld 206 (*) 70 - 99 (mg/dL)   BUN 10  6 - 23 (mg/dL)   Creatinine, Ser 9.14  0.50 - 1.35 (mg/dL)   Calcium 8.9  8.4 - 78.2 (mg/dL)   GFR calc non Af Amer >90  >90 (mL/min)   GFR calc Af Amer >90  >90 (mL/min)  GLUCOSE, CAPILLARY      Component Value Range   Glucose-Capillary 235 (*) 70 - 99 (mg/dL)   Comment 1 Notify RN      Exam - Neurovascular intact Sensation intact distally Intact pulses distally Dressing - clean, dry, no drainage Motor function intact - moving foot and toes well on exam.  Hemovac pulled without difficulty.  Assessment/Plan: 1 Day Post-Op Procedure(s) (LRB): TOTAL HIP ARTHROPLASTY (Left)  Past Medical History  Diagnosis Date  . Benign prostatic hypertrophy   . Arthritis   . Benign  positional vertigo   . Chronic ischemic heart disease, unspecified   . Family history of ischemic heart disease   . Hyperlipidemia   . Esophageal reflux   . CAD (coronary artery disease)   . Observation for suspected malignant neoplasm   . Routine general medical examination at a health care facility   . Balanitis   . HTN (hypertension)   . ED (erectile dysfunction)   . Type II or unspecified type diabetes mellitus without mention of complication, not stated as uncontrolled 01-13-11    oral meds only    Advance diet Up with therapy Discharge home with home health when met goals DVT Prophylaxis - Xarelto  Protocol Partial-Weight Bearing 25-50% left Leg D/C Knee Immobilizer Hemovac Pulled Begin Therapy Hip Preacutions Keep foley until tomorrow. No vaccines.  Mckay Tegtmeyer 01/25/2011, 7:23 AM

## 2011-01-26 ENCOUNTER — Encounter (HOSPITAL_COMMUNITY): Payer: Self-pay | Admitting: Orthopedic Surgery

## 2011-01-26 DIAGNOSIS — M1612 Unilateral primary osteoarthritis, left hip: Secondary | ICD-10-CM

## 2011-01-26 LAB — BASIC METABOLIC PANEL
GFR calc Af Amer: 87 mL/min — ABNORMAL LOW (ref >90)
GFR calc non Af Amer: 75 mL/min — ABNORMAL LOW (ref >90)
Potassium: 3.9 mEq/L (ref 3.5–5.1)
Sodium: 136 mEq/L (ref 135–145)

## 2011-01-26 LAB — PREPARE RBC (CROSSMATCH)

## 2011-01-26 LAB — GLUCOSE, CAPILLARY

## 2011-01-26 LAB — CBC
MCHC: 32.9 g/dL (ref 30.0–36.0)
Platelets: 243 10*3/uL (ref 150–400)
RDW: 14.8 % (ref 11.5–15.5)

## 2011-01-26 MED ORDER — FUROSEMIDE 10 MG/ML IJ SOLN
INTRAMUSCULAR | Status: AC
  Administered 2011-01-26: 10 mg via INTRAVENOUS
  Filled 2011-01-26: qty 4

## 2011-01-26 MED ORDER — FUROSEMIDE 10 MG/ML IJ SOLN
10.0000 mg | Freq: Once | INTRAMUSCULAR | Status: AC
  Administered 2011-01-26: 10 mg via INTRAVENOUS

## 2011-01-26 MED ORDER — ACETAMINOPHEN 325 MG PO TABS
650.0000 mg | ORAL_TABLET | Freq: Once | ORAL | Status: AC
  Administered 2011-01-26: 650 mg via ORAL
  Filled 2011-01-26: qty 2

## 2011-01-26 MED ORDER — MENTHOL 3 MG MT LOZG: 1.0000 | LOZENGE | OROMUCOSAL | Status: DC | PRN

## 2011-01-26 NOTE — Progress Notes (Signed)
Physical Therapy Treatment Patient Details Name: Johnny [name] MRN: [id number] DOB: Jun 27, 1945 Today's Date: 01/26/2011 1400-1420 1Gt  PT Assessment/Plan  PT - Assessment/Plan Comments on Treatment Session: Patient due for meds and RN aware.  Proceeded with treatment anyway due to patient needing to use toilet.  Practiced bed mobility with use of drawer for footstool due to patient with high bed at home. PT Plan: Discharge plan remains appropriate PT Frequency: 7X/week Follow Up Recommendations: Home health PT Equipment Recommended: Rolling walker with 5" wheels PT Goals  Acute Rehab PT Goals PT Goal: Sit to Supine/Side - Progress: Progressing toward goal PT Transfer Goal: Sit to Stand/Stand to Sit - Progress: Progressing toward goal PT Goal: Ambulate - Progress: Progressing toward goal PT Goal: Perform Home Exercise Program - Progress: Progressing toward goal Additional Goals PT Goal: Additional Goal #1 - Progress: Progressing toward goal  PT Treatment Precautions/Restrictions  Precautions Precautions: Posterior Hip Precaution Comments: initiated verbal education Restrictions Weight Bearing Restrictions: Yes LLE Weight Bearing: Partial weight bearing LLE Partial Weight Bearing Percentage or Pounds: 25-50% Mobility (including Balance) Bed Mobility Sit to Supine - Left: 4: Min assist;With rail;HOB flat (bed height at level to simulate home setup) Sit to Supine - Left Details (indicate cue type and reason): use of makeshift foot stool and moderate cues for technique, increased time and assist for left LE into bed Transfers Sit to Stand: 5: Supervision;From chair/3-in-1 Sit to Stand Details (indicate cue type and reason): mod independent from 3:1 in bathroom Stand to Sit: 4: Min assist;To bed;To elevated surface;To chair/3-in-1;With upper extremity assist;With armrests (to 3:1 over toilet) Stand to Sit Details: minguard assist  Ambulation/Gait Ambulation/Gait Assistance: 4:  Min assist Ambulation/Gait Assistance Details (indicate cue type and reason): minguard assist, cues for left foot flat Ambulation Distance (Feet): 12 Feet (times two (to/from bathroom) (no further due to pain)) Assistive device: Rolling walker Gait Pattern: Trunk flexed    Exercise  Total Joint Exercises Short Arc Quad: AROM;Left;10 reps;Supine Hip ABduction/ADduction: AAROM;Both;10 reps;Supine End of Session PT - End of Session Equipment Utilized During Treatment: Gait belt Activity Tolerance: Patient limited by pain Patient left: in bed;with family/visitor present General Behavior During Session: [facility] for tasks performed Cognition: [facility] for tasks performed  [facility] 01/26/2011, 2:39 PM

## 2011-01-26 NOTE — Progress Notes (Signed)
Occupational Therapy Evaluation Patient Details Name: Johnny [name] MRN: [id number] DOB: Jul 29, 1945 Today's Date: 01/26/2011 1250 1322 ev2  Problem List:  Patient Active Problem List  Diagnoses  . WART, VIRAL  . DIABETES MELLITUS, TYPE II  . HYPERLIPIDEMIA  . ERECTILE DYSFUNCTION  . HYPERTENSION  . CORONARY ARTERY DISEASE  . ISCHEMIC HEART DISEASE  . BRONCHITIS, ACUTE  . GERD  . BLADDER OUTLET OBSTRUCTION  . BALANITIS  . PRURITUS  . DEGENERATIVE JOINT DISEASE, LEFT HIP  . ARTHRITIS  . HIP PAIN  . COSTOCHONDRITIS  . CHEST PAIN  . BENIGN PROSTATIC HYPERTROPHY, HX OF  . TOBACCO USE, QUIT  . CORONARY ARTERY BYPASS GRAFT, THREE VESSEL, HX OF  . Preop exam for internal medicine  . Primary osteoarthritis of left hip    Past Medical History:  Past Medical History  Diagnosis Date  . Benign prostatic hypertrophy   . Arthritis   . Benign positional vertigo   . Chronic ischemic heart disease, unspecified   . Family history of ischemic heart disease   . Hyperlipidemia   . Esophageal reflux   . CAD (coronary artery disease)   . Observation for suspected malignant neoplasm   . Routine general medical examination at a health care facility   . Balanitis   . HTN (hypertension)   . ED (erectile dysfunction)   . Type II or unspecified type diabetes mellitus without mention of complication, not stated as uncontrolled 01-13-11    oral meds only   Past Surgical History:  Past Surgical History  Procedure Date  . Penile prosthesis implant 01-13-11    02-08-10  . 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 Cataract extraction 11-812    right eye only    OT Assessment/Plan/Recommendation OT Assessment Clinical Impression Statement: Pt was admitted with L THA with PWB and posterior THPs.  would benefit from skilled OT to reinforce THPs with ADLs for safe discharge home. OT Recommendation/Assessment: Patient will need skilled OT in the acute care  venue OT Problem List: Decreased strength;Decreased activity tolerance;Decreased knowledge of use of DME or AE;Decreased knowledge of precautions OT Therapy Diagnosis : Generalized weakness OT Plan OT Frequency: Min 2X/week OT Treatment/Interventions: Self-care/ADL training;DME and/or AE instruction;Therapeutic activities;Patient/family education OT Recommendation Follow Up Recommendations: Home health OT Equipment Recommended: 3 in 1 bedside comode Individuals Consulted Consulted and Agree with Results and Recommendations: Patient OT Goals Acute Rehab OT Goals OT Goal Formulation: With patient Time For Goal Achievement: 7 days ADL Goals Pt Will Perform Grooming: with supervision Pt Will Perform Grooming: Standing at sink  Pt will Perform Lower Body Bathing:  With supervision;sit to stand from chair; with adaptive equipment ADL Goal: Lower Body Bathing - Progress: Progressing toward goals  Pt Will Perform Lower Body Dressing: with supervision;Sit to stand from chair;with adaptive equipment ADL Goal: Lower Body Dressing - Progress: Progressing toward goals  Pt Will Transfer to Toilet: with supervision;3-in-1;Ambulation;with cueing (comment type and amount) (no more than 1 vc) ADL Goal: Toilet Transfer - Progress: Progressing toward goals  Pt Will Perform Toileting - Clothing Manipulation: with supervision;Standing ADL Goal: Toileting - Clothing Manipulation - Progress: Progressing toward goals  Pt Will Perform Toileting - Hygiene: with supervision;Standing at 3-in-1/toilet ADL Goal: Toileting - Hygiene - Progress: Progressing toward goals  OT Evaluation Precautions/Restrictions  Precautions Precautions: Posterior Hip Precaution Comments: initiated verbal education Restrictions Weight Bearing Restrictions: Yes LLE Weight Bearing: Partial weight bearing LLE Partial Weight Bearing Percentage or Pounds: 25-50%  Prior Functioning     ADL ADL Grooming: Simulated;Set up Where  Assessed - Grooming: Sitting, chair;Supported Upper Body Bathing: Simulated;Set up Where Assessed - Upper Body Bathing: Sitting, chair;Supported Lower Body Bathing: Simulated;Moderate assistance;Other (comment) (used reacher to simulate washing LB; showed sponge) Where Assessed - Lower Body Bathing: Sit to stand from chair Upper Body Dressing: Simulated;Minimal assistance;Other (comment) (IV present) Where Assessed - Upper Body Dressing: Sitting, chair;Supported Lower Body Dressing: Simulated;Moderate assistance;Other (comment) (used reacher with pillowcase to simulate pants/show sock aid) Where Assessed - Lower Body Dressing: Sit to stand from chair Toilet Transfer: Not assessed Toileting - Clothing Manipulation: Simulated;Minimal assistance Where Assessed - Glass blower/designer Manipulation: Standing Toileting - Hygiene: Simulated;Minimal assistance Where Assessed - Toileting Hygiene: Standing Tub/Shower Transfer: Other (comment) (demonstrated tub bench; pt will sponge bathe) Equipment Used: Rolling walker ADL Comments: educated on THPs with ADLs  Wife will assist.  He has a Sports administrator at home.  Decided he'll sponge bathe after seeing tub bench transfer Vision/Perception  Vision - History Baseline Vision: Wears glasses all the time Patient Visual Report: No change from baseline Cognition Cognition Overall Cognitive Status: Appears within functional limits for tasks assessed Cognition - Other Comments: will need reinforcement for thps:   able to name 1/3 but recognized others Sensation/Coordination   Extremity Assessment RUE Assessment RUE Assessment: Within Functional Limits LUE Assessment LUE Assessment: Within Functional Limits Mobility  Transfers Sit to Stand: 4: Min assist;From chair/3-in-1;Other (comment) Sit to Stand Details (indicate cue type and reason): min cues for leg placement Stand to Sit: With upper extremity assist;To chair/3-in-1;With armrests Stand to Sit Details:  cues for technique Exercises Total Joint Exercises Ankle Circles/Pumps: AROM;Both;10 reps;Seated Heel Slides: AAROM;Both;10 reps;Seated Long Arc Quad: AROM;Left;10 reps;Seated End of Session OT - End of Session Activity Tolerance: Patient limited by pain Patient left: in chair;with call bell in reach General Behavior During Session: [facility] for tasks performed Cognition: [facility] for tasks performed   Verdie Barrows [phone] 01/26/2011, 2:08 PM

## 2011-01-26 NOTE — Progress Notes (Signed)
Subjective: 2 Days Post-Op Procedure(s) (LRB): TOTAL HIP ARTHROPLASTY (Left) Patient reports pain as moderate.   Patient seen in rounds with Dr. Lequita Halt. Patient has complaints of sore throat  Objective: Vital signs in last 24 hours: Temp:  [98.2 F (36.8 C)-99.9 F (37.7 C)] 99.9 F (37.7 C) (11/21 0136) Pulse Rate:  [73-84] 84  (11/21 0136) Resp:  [18] 18  (11/21 0136) BP: (107-133)/(63-75) 133/75 mmHg (11/21 0136) SpO2:  [98 %-100 %] 100 % (11/21 0136)  Intake/Output from previous day:  Intake/Output Summary (Last 24 hours) at 01/26/11 0751 Last data filed at 01/26/11 0500  Gross per 24 hour  Intake 1637.5 ml  Output   1100 ml  Net  537.5 ml    Intake/Output this shift:    Labs: Results for orders placed during the hospital encounter of 01/24/11  TYPE AND SCREEN      Component Value Range   ABO/RH(D) O POS     Antibody Screen NEG     Sample Expiration 01/27/2011    GLUCOSE, CAPILLARY      Component Value Range   Glucose-Capillary 94  70 - 99 (mg/dL)  ABO/RH      Component Value Range   ABO/RH(D) O POS    GLUCOSE, CAPILLARY      Component Value Range   Glucose-Capillary 93  70 - 99 (mg/dL)  GLUCOSE, CAPILLARY      Component Value Range   Glucose-Capillary 158 (*) 70 - 99 (mg/dL)   Comment 1 Documented in Chart     Comment 2 Notify RN    CBC      Component Value Range   WBC 8.3  4.0 - 10.5 (K/uL)   RBC 3.96 (*) 4.22 - 5.81 (MIL/uL)   Hemoglobin 10.9 (*) 13.0 - 17.0 (g/dL)   HCT 16.1 (*) 09.6 - 52.0 (%)   MCV 81.6  78.0 - 100.0 (fL)   MCH 27.5  26.0 - 34.0 (pg)   MCHC 33.7  30.0 - 36.0 (g/dL)   RDW 04.5  40.9 - 81.1 (%)   Platelets 260  150 - 400 (K/uL)  BASIC METABOLIC PANEL      Component Value Range   Sodium 135  135 - 145 (mEq/L)   Potassium 4.2  3.5 - 5.1 (mEq/L)   Chloride 101  96 - 112 (mEq/L)   CO2 26  19 - 32 (mEq/L)   Glucose, Bld 206 (*) 70 - 99 (mg/dL)   BUN 10  6 - 23 (mg/dL)   Creatinine, Ser 9.14  0.50 - 1.35 (mg/dL)   Calcium 8.9   8.4 - 10.5 (mg/dL)   GFR calc non Af Amer >90  >90 (mL/min)   GFR calc Af Amer >90  >90 (mL/min)  GLUCOSE, CAPILLARY      Component Value Range   Glucose-Capillary 235 (*) 70 - 99 (mg/dL)   Comment 1 Notify RN    GLUCOSE, CAPILLARY      Component Value Range   Glucose-Capillary 178 (*) 70 - 99 (mg/dL)  GLUCOSE, CAPILLARY      Component Value Range   Glucose-Capillary 135 (*) 70 - 99 (mg/dL)  CBC      Component Value Range   WBC 7.2  4.0 - 10.5 (K/uL)   RBC 3.76 (*) 4.22 - 5.81 (MIL/uL)   Hemoglobin 10.3 (*) 13.0 - 17.0 (g/dL)   HCT 78.2 (*) 95.6 - 52.0 (%)   MCV 83.2  78.0 - 100.0 (fL)   MCH 27.4  26.0 - 34.0 (  pg)   MCHC 32.9  30.0 - 36.0 (g/dL)   RDW 46.9  62.9 - 52.8 (%)   Platelets 243  150 - 400 (K/uL)  BASIC METABOLIC PANEL      Component Value Range   Sodium 136  135 - 145 (mEq/L)   Potassium 3.9  3.5 - 5.1 (mEq/L)   Chloride 102  96 - 112 (mEq/L)   CO2 28  19 - 32 (mEq/L)   Glucose, Bld 166 (*) 70 - 99 (mg/dL)   BUN 12  6 - 23 (mg/dL)   Creatinine, Ser 4.13  0.50 - 1.35 (mg/dL)   Calcium 8.8  8.4 - 24.4 (mg/dL)   GFR calc non Af Amer 75 (*) >90 (mL/min)   GFR calc Af Amer 87 (*) >90 (mL/min)  GLUCOSE, CAPILLARY      Component Value Range   Glucose-Capillary 146 (*) 70 - 99 (mg/dL)  GLUCOSE, CAPILLARY      Component Value Range   Glucose-Capillary 138 (*) 70 - 99 (mg/dL)    Exam - Neurologically intact Neurovascular intact Incision: no drainage Dressing/Incision - clean, dry, no drainage Motor function intact - moving foot and toes well on exam.  Throat shows no thrush and is not inflammed  Assessment/Plan: 2 Days Post-Op Procedure(s) (LRB): TOTAL HIP ARTHROPLASTY (Left)  Past Medical History  Diagnosis Date  . Benign prostatic hypertrophy   . Arthritis   . Benign positional vertigo   . Chronic ischemic heart disease, unspecified   . Family history of ischemic heart disease   . Hyperlipidemia   . Esophageal reflux   . CAD (coronary artery disease)    . Observation for suspected malignant neoplasm   . Routine general medical examination at a health care facility   . Balanitis   . HTN (hypertension)   . ED (erectile dysfunction)   . Type II or unspecified type diabetes mellitus without mention of complication, not stated as uncontrolled 01-13-11    oral meds only    Advance diet Up with therapy D/C IV fluids Plan for discharge tomorrow Discharge home with home health  DVT Prophylaxis - Xarelto Protocol PWB to left leg  Gottfried Standish V 01/26/2011, 7:51 AM

## 2011-01-26 NOTE — Progress Notes (Signed)
Physical Therapy Treatment Patient Details Name: Johnny [name] MRN: [id number] DOB: 1945/06/04 Today's Date: 01/26/2011 1000-1026 1Gt,1Te  PT Assessment/Plan  PT - Assessment/Plan Comments on Treatment Session: Patient tolerated ambulation today without symptoms of dizziness or nausea.  Will practice steps later this pm in anticipation of d/c tomorrow.  Able to verbalize 2/3 hip precautions. PT Plan: Discharge plan remains appropriate PT Frequency: 7X/week Follow Up Recommendations: Home health PT Equipment Recommended: Rolling walker with 5" wheels PT Goals  Acute Rehab PT Goals PT Transfer Goal: Sit to Stand/Stand to Sit - Progress: Progressing toward goal PT Goal: Ambulate - Progress: Progressing toward goal PT Goal: Perform Home Exercise Program - Progress: Progressing toward goal Additional Goals PT Goal: Additional Goal #1 - Progress: Progressing toward goal  PT Treatment Precautions/Restrictions  Precautions Precautions: Posterior Hip Precaution Comments: initiated verbal education Restrictions Weight Bearing Restrictions: Yes LLE Weight Bearing: Partial weight bearing LLE Partial Weight Bearing Percentage or Pounds: 25-50% Mobility (including Balance) Transfers Sit to Stand: 4: Min assist;From chair/3-in-1;With upper extremity assist;With armrests Sit to Stand Details (indicate cue type and reason): cue to scoot to edge of chair, to push from chair Stand to Sit: With upper extremity assist;To chair/3-in-1;With armrests Stand to Sit Details: cues for technique Ambulation/Gait Ambulation/Gait Assistance: 4: Min assist Ambulation/Gait Assistance Details (indicate cue type and reason): cues for sequence, proximity to walker, left foot turned out Ambulation Distance (Feet): 100 Feet Assistive device: Rolling walker Gait Pattern: Step-to pattern (with PWB left)    Exercise  Total Joint Exercises Ankle Circles/Pumps: AROM;Both;10 reps;Seated Heel Slides:  AAROM;Both;10 reps;Seated Long Arc Quad: AROM;Left;10 reps;Seated End of Session PT - End of Session Equipment Utilized During Treatment: Gait belt Activity Tolerance: Patient tolerated treatment well;Other (comment) (while recieving blood) Patient left: in chair General Behavior During Session: [facility] for tasks performed Cognition: [facility] for tasks performed  [facility] 01/26/2011, 12:29 PM

## 2011-01-27 LAB — BASIC METABOLIC PANEL
BUN: 10 mg/dL (ref 6–23)
Chloride: 100 mEq/L (ref 96–112)
Creatinine, Ser: 0.89 mg/dL (ref 0.50–1.35)
GFR calc Af Amer: >90 mL/min (ref >90)
Glucose, Bld: 81 mg/dL (ref 70–99)

## 2011-01-27 LAB — CBC
HCT: 34.1 % — ABNORMAL LOW (ref 39.0–52.0)
Hemoglobin: 11.4 g/dL — ABNORMAL LOW (ref 13.0–17.0)
MCHC: 33.4 g/dL (ref 30.0–36.0)
MCV: 83.4 fL (ref 78.0–100.0)
RDW: 14.7 % (ref 11.5–15.5)
WBC: 10.1 10*3/uL (ref 4.0–10.5)

## 2011-01-27 LAB — GLUCOSE, CAPILLARY

## 2011-01-27 LAB — TYPE AND SCREEN
Antibody Screen: NEGATIVE
Unit division: 0

## 2011-01-27 MED ORDER — RIVAROXABAN 10 MG PO TABS: 10.0000 mg | ORAL_TABLET | ORAL | Status: DC

## 2011-01-27 MED ORDER — OXYCODONE HCL 5 MG PO TABS: 5.0000 mg | ORAL_TABLET | ORAL | Status: AC | PRN

## 2011-01-27 MED ORDER — METHOCARBAMOL 500 MG PO TABS: 500.0000 mg | ORAL_TABLET | Freq: Four times a day (QID) | ORAL | Status: AC | PRN

## 2011-01-27 NOTE — Progress Notes (Signed)
Occupational Therapy Treatment Patient Details Name: Johnny [name] MRN: [id number] DOB: 03/09/1945 Today's Date: 01/27/2011 9:48-10:13  OT Assessment/Plan OT Assessment/Plan Comments on Treatment Session: Pt education performed and pt to D/C today. He is aware of THP and WB'ing status as well as his wife. Wife will A with BADLs prn. They will get a tub transfer bench at later date if they decided they want one.  OT Plan: Discharge plan needs to be updated;Other (comment) (No HHOT needed, wife to A prn.) Follow Up Recommendations: None Equipment Recommended: 3 in 1 bedside comode OT Goals ADL Goals ADL Goal: Grooming - Progress: Not addressed ADL Goal: Lower Body Bathing - Progress: Not addressed ADL Goal: Lower Body Dressing - Progress: Progressing toward goals ADL Goal: Toilet Transfer - Progress: Not addressed ADL Goal: Toileting - Clothing Manipulation - Progress: Not addressed ADL Goal: Toileting - Hygiene - Progress: Not addressed Miscellaneous OT Goals Miscellaneous OT Goal #1: Pt able to state 3:3 posterior THR precautions OT Goal: Miscellaneous Goal #1 - Progress: Met  OT Treatment Precautions/Restrictions  Precautions Precautions: Posterior Hip Required Braces or Orthoses: No Restrictions Weight Bearing Restrictions: Yes LLE Weight Bearing: Partial weight bearing LLE Partial Weight Bearing Percentage or Pounds: 25-50%   ADL ADL Eating/Feeding: Not assessed Grooming: Not assessed Upper Body Bathing: Not assessed Lower Body Bathing: Not assessed Upper Body Dressing: Not assessed Lower Body Dressing: Supervision/safety;Set up Lower Body Dressing Details (indicate cue type and reason): Pt cannot gather his own items by himself at this time and needed cues for use of AE. Where Assessed - Lower Body Dressing: Sitting, chair;Supported Statistician: Not assessed Toileting - Architect: Not assessed Toileting - Hygiene: Not assessed Tub/Shower Transfer:  Other (comment) (Family to decided on getting a tub bench at later date.) Tub/Shower Transfer Method: Not assessed ADL Comments: Wife reports after seeing how AE works that she will A pt with BADLs prn Mobility  Bed Mobility Bed Mobility: No Transfers Transfers: No Exercises    End of Session OT - End of Session Equipment Utilized During Treatment: Other (comment) (reacher for don socks, and sock aide to don socks) Activity Tolerance: Patient tolerated treatment well Patient left: in chair;with call bell in reach;with family/visitor present;Other (comment) (wife) General Behavior During Session: [facility] for tasks performed Cognition: [facility] for tasks performed  Evette Georges, OTR/L [phone] 01/27/2011, 10:32 AM

## 2011-01-27 NOTE — Progress Notes (Signed)
   CARE MANAGEMENT NOTE 01/27/2011  Patient:  Johnny [name], Johnny [name]   Account Number:  [id number]  Date Initiated:  01/25/2011  Documentation initiated by:  CRAFT,TERRI  Subjective/Objective Assessment:   65 yo male with Left hip osteoarthritis S/P hip replacement     Action/Plan:   D/C home when medicallt stable.   Anticipated DC Date:  01/28/2011   Anticipated DC Plan:  HOME W HOME HEALTH SERVICES      DC Planning Services  CM consult      Choice offered to / List presented to:  C-1 Patient   DME arranged  [facility]  3-N-1      DME agency  [facility]     HH arranged  HH-2 PT      [facility] agency  [facility]   Status of service:  Completed, signed off Medicare Important Message given?   (If response is "NO", the following Medicare IM given date fields will be blank) Date Medicare IM given:   Date Additional Medicare IM given:    Discharge Disposition:  HOME W HOME HEALTH SERVICES  Per UR Regulation:    Comments:  01/27/11  Blanche East RN, BSN [phone] (ON-CALL) received call from RN stating pt needed RW with 5 inch wheels, 3 N 1, HHPT; services already arranged with [facility], DME ordered from [facility] and order faxed by RN.  01/25/2011, 1545, Kathi Der RNC-MNN, BSN.  Cm spoke to pt.  He states his wife will be at home to assist him.  Pt is using [place] for home health services arranged through his doctor's office and pt states this agreeable to him. Pt was offered choice and he chooses to stay with [facility].

## 2011-01-27 NOTE — Progress Notes (Signed)
Physical Therapy Treatment Patient Details Name: Johnny [name] MRN: [id number] DOB: 09-Aug-1945 Today's Date: 01/27/2011  PT Assessment/Plan  PT - Assessment/Plan Comments on Treatment Session: Patient is s/p THA and is progressing with mobility.  All education complete for pt. to D/C home today.  Needs HHPT and RW and 3N1 as well.  Nursing contacting case managment to arrange equipment. PT Plan: Discharge plan remains appropriate;Frequency remains appropriate PT Frequency: 7X/week Follow Up Recommendations: Home health PT Equipment Recommended: Rolling walker with 5" wheels;3 in 1 bedside comode PT Goals  Acute Rehab PT Goals PT Goal: Supine/Side to Sit - Progress: Other (comment) PT Goal: Sit to Supine/Side - Progress: Other (comment) PT Transfer Goal: Sit to Stand/Stand to Sit - Progress: Met PT Goal: Ambulate - Progress: Met PT Goal: Up/Down Stairs - Progress: Met PT Goal: Perform Home Exercise Program - Progress: Partly met Additional Goals PT Goal: Additional Goal #1 - Progress: Met  PT Treatment Precautions/Restrictions  Precautions Precautions: Fall;Posterior Hip Precaution Booklet Issued: Yes (comment) Precaution Comments: Gave hip precaution handout, exercise handout and stair handout. Required Braces or Orthoses: No Restrictions Weight Bearing Restrictions: Yes LLE Weight Bearing: Partial weight bearing LLE Partial Weight Bearing Percentage or Pounds: 25-50% Mobility (including Balance) Bed Mobility Bed Mobility: No Supine to Sit: Not tested (comment) Sitting - Scoot to Edge of Bed: Not tested (comment) Sit to Supine - Left: Not tested (comment) Transfers Sit to Stand: 5: Supervision;From chair/3-in-1 Sit to Stand Details (indicate cue type and reason): wife gives cues as needed.  Pt. remembers to place left LE out in front to follow hip precautions. Stand to Sit: 5: Supervision;To chair/3-in-1;Without upper extremity assist;With armrests Stand to Sit Details:  Wife gives cues for left LE position with transfers Ambulation/Gait Ambulation/Gait Assistance: 5: Supervision Ambulation/Gait Assistance Details (indicate cue type and reason): Good safety with RW.  Occasional cues for technique Ambulation Distance (Feet): 160 Feet Assistive device: Rolling walker Gait Pattern: Trunk flexed;Antalgic;Decreased hip/knee flexion - left;Decreased stance time - left;Decreased step length - right Stairs: Yes Stairs Assistance: 4: Min assist Stairs Assistance Details (indicate cue type and reason): wife and pt. able to perform with above assist with RW with wife providing appropriate cues throughout. Stair Management Technique: Backwards;With walker Number of Stairs: 3  Height of Stairs: 5  Wheelchair Mobility Wheelchair Mobility: No  Posture/Postural Control Posture/Postural Control: No significant limitations Balance Balance Assessed: No Exercise  Total Joint Exercises Ankle Circles/Pumps: AROM;Strengthening;Left;10 reps;Supine Quad Sets: AROM;Left;10 reps;Supine Heel Slides: AROM;Left;5 reps;Supine Hip ABduction/ADduction: AROM;10 reps;Left;Seated Long Arc Quad: AROM;Left;10 reps;Seated End of Session PT - End of Session Equipment Utilized During Treatment: Gait belt Activity Tolerance: Patient limited by pain Patient left: in chair;with call bell in reach;with family/visitor present Nurse Communication: Mobility status for ambulation General Behavior During Session: [facility] for tasks performed Cognition: [facility] for tasks performed  INGOLD,Davidson Palmieri 01/27/2011, 11:28 AM [facility] Acute Rehabilitation [phone] (pager)

## 2011-01-27 NOTE — Progress Notes (Signed)
Patient medically stable. DC'd to home with family via wheelchair. Home Health and DME provided for patient.

## 2011-01-27 NOTE — Plan of Care (Signed)
Problem: Phase II Progression Outcomes Goal: Progress activity as tolerated unless otherwise ordered Outcome: Progressing Patient needs a RW, 3N1 commode and HHPT f/u for home.  Continue PT until pt. Discharged from hospital.

## 2011-01-30 NOTE — Discharge Summary (Signed)
Physician Discharge Summary   Patient ID: Johnny [name] MRN: [id number] DOB/AGE: 66-Feb-1947 66 y.o.  Admit date: 01/24/2011 Discharge date: 01/27/2011  Primary Diagnosis: Osteoarthritis Left Hip    Admission Diagnoses: Past Medical History  Diagnosis Date  . Benign prostatic hypertrophy   . Arthritis   . Benign positional vertigo   . Chronic ischemic heart disease, unspecified   . Family history of ischemic heart disease   . Hyperlipidemia   . Esophageal reflux   . CAD (coronary artery disease)   . Observation for suspected malignant neoplasm   . Routine general medical examination at a health care facility   . Balanitis   . HTN (hypertension)   . ED (erectile dysfunction)   . Type II or unspecified type diabetes mellitus without mention of complication, not stated as uncontrolled 01-13-11    oral meds only  History of Brochitits  Bladder Outlet Obstruction Lumbago    Discharge Diagnoses:  Principal Problem:  *Primary osteoarthritis of left hip   Procedure: Procedure(s) (LRB): TOTAL HIP ARTHROPLASTY (Left)   Consults: none  HPI:  Johnny [name] is a 65 y.o. male with end stage arthritis of his left hip with progressively worsening pain and dysfunction. Pain occurs with activity and rest including pain at night. He has tried analgesics, protected weight bearing and rest without benefit. Pain is too severe to attempt physical therapy. Radiographs demonstrate bone on bone arthritis with subchondral cyst formation. He presents now for left THA  Laboratory Data: Results for orders placed during the hospital encounter of 01/24/11  TYPE AND SCREEN      Component Value Range   ABO/RH(D) O POS     Antibody Screen NEG     Sample Expiration 01/27/2011     Unit Number [id number]     Blood Component Type RBC LR PHER2     Unit division 00     Status of Unit ISSUED,FINAL     Transfusion Status OK TO TRANSFUSE     Crossmatch Result Compatible     Unit Number [id number]       Blood Component Type RBC LR PHER2     Unit division 00     Status of Unit ISSUED,FINAL     Transfusion Status OK TO TRANSFUSE     Crossmatch Result Compatible    GLUCOSE, CAPILLARY      Component Value Range   Glucose-Capillary 94  70 - 99 (mg/dL)  ABO/RH      Component Value Range   ABO/RH(D) O POS    GLUCOSE, CAPILLARY      Component Value Range   Glucose-Capillary 93  70 - 99 (mg/dL)  GLUCOSE, CAPILLARY      Component Value Range   Glucose-Capillary 158 (*) 70 - 99 (mg/dL)   Comment 1 Documented in Chart     Comment 2 Notify RN    CBC      Component Value Range   WBC 8.3  4.0 - 10.5 (K/uL)   RBC 3.96 (*) 4.22 - 5.81 (MIL/uL)   Hemoglobin 10.9 (*) 13.0 - 17.0 (g/dL)   HCT 13.2 (*) 44.0 - 52.0 (%)   MCV 81.6  78.0 - 100.0 (fL)   MCH 27.5  26.0 - 34.0 (pg)   MCHC 33.7  30.0 - 36.0 (g/dL)   RDW 10.2  72.5 - 36.6 (%)   Platelets 260  150 - 400 (K/uL)  BASIC METABOLIC PANEL      Component Value Range   Sodium 135  135 - 145 (mEq/L)   Potassium 4.2  3.5 - 5.1 (mEq/L)   Chloride 101  96 - 112 (mEq/L)   CO2 26  19 - 32 (mEq/L)   Glucose, Bld 206 (*) 70 - 99 (mg/dL)   BUN 10  6 - 23 (mg/dL)   Creatinine, Ser 0.86  0.50 - 1.35 (mg/dL)   Calcium 8.9  8.4 - 57.8 (mg/dL)   GFR calc non Af Amer >90  >90 (mL/min)   GFR calc Af Amer >90  >90 (mL/min)  GLUCOSE, CAPILLARY      Component Value Range   Glucose-Capillary 235 (*) 70 - 99 (mg/dL)   Comment 1 Notify RN    GLUCOSE, CAPILLARY      Component Value Range   Glucose-Capillary 178 (*) 70 - 99 (mg/dL)  GLUCOSE, CAPILLARY      Component Value Range   Glucose-Capillary 135 (*) 70 - 99 (mg/dL)  CBC      Component Value Range   WBC 7.2  4.0 - 10.5 (K/uL)   RBC 3.76 (*) 4.22 - 5.81 (MIL/uL)   Hemoglobin 10.3 (*) 13.0 - 17.0 (g/dL)   HCT 46.9 (*) 62.9 - 52.0 (%)   MCV 83.2  78.0 - 100.0 (fL)   MCH 27.4  26.0 - 34.0 (pg)   MCHC 32.9  30.0 - 36.0 (g/dL)   RDW 52.8  41.3 - 24.4 (%)   Platelets 243  150 - 400 (K/uL)  BASIC  METABOLIC PANEL      Component Value Range   Sodium 136  135 - 145 (mEq/L)   Potassium 3.9  3.5 - 5.1 (mEq/L)   Chloride 102  96 - 112 (mEq/L)   CO2 28  19 - 32 (mEq/L)   Glucose, Bld 166 (*) 70 - 99 (mg/dL)   BUN 12  6 - 23 (mg/dL)   Creatinine, Ser 0.10  0.50 - 1.35 (mg/dL)   Calcium 8.8  8.4 - 27.2 (mg/dL)   GFR calc non Af Amer 75 (*) >90 (mL/min)   GFR calc Af Amer 87 (*) >90 (mL/min)  GLUCOSE, CAPILLARY      Component Value Range   Glucose-Capillary 146 (*) 70 - 99 (mg/dL)  GLUCOSE, CAPILLARY      Component Value Range   Glucose-Capillary 138 (*) 70 - 99 (mg/dL)  PREPARE RBC (CROSSMATCH)      Component Value Range   Order Confirmation ORDER PROCESSED BY BLOOD BANK    GLUCOSE, CAPILLARY      Component Value Range   Glucose-Capillary 155 (*) 70 - 99 (mg/dL)   Comment 1 Documented in Chart     Comment 2 Notify RN    GLUCOSE, CAPILLARY      Component Value Range   Glucose-Capillary 168 (*) 70 - 99 (mg/dL)   Comment 1 Documented in Chart     Comment 2 Notify RN    CBC      Component Value Range   WBC 10.1  4.0 - 10.5 (K/uL)   RBC 4.09 (*) 4.22 - 5.81 (MIL/uL)   Hemoglobin 11.4 (*) 13.0 - 17.0 (g/dL)   HCT 53.6 (*) 64.4 - 52.0 (%)   MCV 83.4  78.0 - 100.0 (fL)   MCH 27.9  26.0 - 34.0 (pg)   MCHC 33.4  30.0 - 36.0 (g/dL)   RDW 03.4  74.2 - 59.5 (%)   Platelets 236  150 - 400 (K/uL)  BASIC METABOLIC PANEL      Component Value Range   Sodium  137  135 - 145 (mEq/L)   Potassium 3.5  3.5 - 5.1 (mEq/L)   Chloride 100  96 - 112 (mEq/L)   CO2 29  19 - 32 (mEq/L)   Glucose, Bld 81  70 - 99 (mg/dL)   BUN 10  6 - 23 (mg/dL)   Creatinine, Ser 9.56  0.50 - 1.35 (mg/dL)   Calcium 9.1  8.4 - 21.3 (mg/dL)   GFR calc non Af Amer 87 (*) >90 (mL/min)   GFR calc Af Amer >90  >90 (mL/min)  GLUCOSE, CAPILLARY      Component Value Range   Glucose-Capillary 125 (*) 70 - 99 (mg/dL)  GLUCOSE, CAPILLARY      Component Value Range   Glucose-Capillary 120 (*) 70 - 99 (mg/dL)   Comment 1  Documented in Chart     Comment 2 Notify RN    GLUCOSE, CAPILLARY      Component Value Range   Glucose-Capillary 94  70 - 99 (mg/dL)   Comment 1 Documented in Chart     Comment 2 Notify RN     Office Visit on 01/13/2011  Component Date Value Range Status  . aPTT (seconds) 01/13/2011 35  24-37 Final  . WBC (K/uL) 01/13/2011 6.1  4.0-10.5 Final  . RBC (MIL/uL) 01/13/2011 4.62  4.22-5.81 Final  . Hemoglobin (g/dL) 08/65/7846 96.2* 95.2-84.1 Final  . HCT (%) 01/13/2011 38.3* 39.0-52.0 Final  . MCV (fL) 01/13/2011 82.9  78.0-100.0 Final  . MCH (pg) 01/13/2011 27.5  26.0-34.0 Final  . MCHC (g/dL) 32/44/0102 72.5  36.6-44.0 Final  . RDW (%) 01/13/2011 14.9  11.5-15.5 Final  . Platelets (K/uL) 01/13/2011 274  150-400 Final  . Sodium (mEq/L) 01/13/2011 137  135-145 Final  . Potassium (mEq/L) 01/13/2011 4.1  3.5-5.1 Final  . Chloride (mEq/L) 01/13/2011 101  96-112 Final  . CO2 (mEq/L) 01/13/2011 27  19-32 Final  . Glucose, Bld (mg/dL) 34/74/2595 638* 75-64 Final  . BUN (mg/dL) 33/29/5188 9  4-16 Final  . Creatinine, Ser (mg/dL) 60/63/0160 1.09  3.23-5.57 Final  . Calcium (mg/dL) 32/20/2542 9.8  7.0-62.3 Final  . Total Protein (g/dL) 76/28/3151 7.5  7.6-1.6 Final  . Albumin (g/dL) 07/37/1062 3.8  6.9-4.8 Final  . AST (U/L) 01/13/2011 13  0-37 Final  . ALT (U/L) 01/13/2011 14  0-53 Final  . Alkaline Phosphatase (U/L) 01/13/2011 86  39-117 Final  . Total Bilirubin (mg/dL) 54/62/7035 0.3  0.0-9.3 Final  . GFR calc non Af Amer (mL/min) 01/13/2011 90* >90 Final  . GFR calc Af Amer (mL/min) 01/13/2011 >90  >90 Final   Comment:                                 The eGFR has been calculated                          using the CKD EPI equation.                          This calculation has not been                          validated in all clinical                          situations.  eGFR's persistently                          <90 mL/min signify                           possible Chronic Kidney Disease.  [name] Prothrombin Time (seconds) 01/13/2011 15.4* 11.6-15.2 Final  . INR  01/13/2011 1.19  0.00-1.49 Final  . Color, Urine  01/13/2011 YELLOW  YELLOW Final  . Appearance  01/13/2011 CLEAR  CLEAR Final  . Specific Gravity, Urine  01/13/2011 1.030  1.005-1.030 Final  . pH  01/13/2011 6.0  5.0-8.0 Final  . Glucose, UA (mg/dL) 96/06/5407 NEGATIVE  NEGATIVE Final  . Hgb urine dipstick  01/13/2011 NEGATIVE  NEGATIVE Final  . Bilirubin Urine  01/13/2011 NEGATIVE  NEGATIVE Final  . Ketones, ur (mg/dL) 81/19/1478 NEGATIVE  NEGATIVE Final  . Protein, ur (mg/dL) 29/56/2130 NEGATIVE  NEGATIVE Final  . Urobilinogen, UA (mg/dL) 86/57/8469 0.2  6.2-9.5 Final  . Nitrite  01/13/2011 NEGATIVE  NEGATIVE Final  . Leukocytes, UA  01/13/2011 NEGATIVE  NEGATIVE Final   MICROSCOPIC NOT DONE ON URINES WITH NEGATIVE PROTEIN, BLOOD, LEUKOCYTES, NITRITE, OR GLUCOSE <1000 mg/dL.  [name] MRSA, PCR  01/13/2011 POSITIVE* NEGATIVE Final   Comment: RESULT CALLED TO, READ BACK BY AND VERIFIED WITH:                          HENDRICK W. BY GALLIMORE S. [id number] 01/13/11  . Staphylococcus aureus  01/13/2011 POSITIVE* NEGATIVE Final   Comment:                                 The Xpert SA Assay (FDA                          approved for NASAL specimens                          only), is one component of                          a comprehensive surveillance                          program.  It is not intended                          to diagnose infection nor to                          guide or monitor treatment.    X-Rays:Dg Chest 2 View  01/13/2011  *RADIOLOGY REPORT*  Clinical Data: Preoperative respiratory exam.  Severe osteoarthritis of the left hip.  CHEST - 2 VIEW  Comparison: 06/29/2006  Findings: The heart size and pulmonary vascularity are normal and the lungs are clear.  Evidence of prior CABG.  No acute osseous abnormality.  IMPRESSION: No acute abnormalities.  Original Report Authenticated  By: Gwynn Burly, M.D.   Ap Pelvis Hip  01/24/2011  *RADIOLOGY REPORT*  Clinical Data: Left hip arthroplasty  PELVIS - 1-2 VIEW  Comparison: 01/24/2011  Findings: Left hip arthroplasty has been performed.  Femoral  component is not fully imaged distally.  Advanced degenerative arthritic changes of the right hip as well.  Pelvis intact. Vascular calcifications noted.  Penile prosthesis present.  IMPRESSION: Expected appearance status post left hip arthroplasty.  Femoral component incompletely imaged on this single view of the pelvis.  Original Report Authenticated By: Judie Petit. Ruel Favors, M.D.   Dg Hip Complete Left  01/24/2011  *RADIOLOGY REPORT*  Clinical Data: Osteoarthritis left hip, preoperative assessment  LEFT HIP - COMPLETE 2+ VIEW  Comparison: 08/28/2007, 06/23/2010  Findings: Bones appear demineralized. Advanced osteoarthritic changes of bilateral hip joints with joint space narrowing, marginal spur formation, subchondral sclerosis, and subchondral cyst formation. SI joints are nonvisualized, question ankylosis. Facet degenerative changes lower lumbar spine. Scattered atherosclerotic calcifications. Penile prosthesis. Visualized bowel gas pattern normal.  IMPRESSION: Advanced osteoarthritic changes of bilateral hip joints. Question ankylosis of the SI joints bilaterally.  Original Report Authenticated By: Lollie Marrow, M.D.    EKG: Orders placed in visit on 08/03/10  . EKG 12-LEAD     Hospital Course: Patient was admitted to [facility] and taken to the OR and underwent the above state procedure without complications.  Patient tolerated the procedure well and was later transferred to the recovery room and then to the orthopaedic floor for postoperative care.  They were given PO and IV analgesics for pain control following their surgery.  They were given 24 hours of postoperative antibiotics and started on DVT prophylaxis.   PT and OT were ordered for total joint protocol.  Discharge  planning consulted to help with postop disposition and equipment needs.  Patient had a good night on the evening of surgery and started to get up with therapy on day one.  He was weaned over to PO meds.  Hemovac drain was pulled without difficulty.  Continued to progress with therapy into day two.  Dressing was changed on day two and the incision was healing. He had some complaints of sore throat and pain after walking on day one.  By day three, the patient had progressed with therapy and meeting his therapy goals.  Incision was healing well.  Patient was seen in rounds and was ready to go home.    Discharge Medications: Prior to Admission medications   Medication Sig Start Date End Date Taking? Authorizing Provider  doxazosin (CARDURA) 8 MG tablet  12/14/10  Yes Sonda Primes, MD  enalapril (VASOTEC) 10 MG tablet   11/29/10  Yes Sonda Primes, MD  esomeprazole (NEXIUM) 40 MG capsule Take 40 mg by mouth daily before breakfast.     Yes Historical Provider, MD  glimepiride (AMARYL) 4 MG tablet Take 4 mg by mouth 1 day or 1 dose.    Yes Historical Provider, MD  lovastatin (MEVACOR) 20 MG tablet TAKE ONE TABLET BY MOUTH EVERY DAY 01/11/11  Yes Sonda Primes, MD  metFORMIN (GLUCOPHAGE) 1000 MG tablet TAKE ONE TABLET BY MOUTH TWICE DAILY 07/13/10  Yes Sonda Primes, MD  omeprazole (PRILOSEC) 20 MG capsule Take 20 mg by mouth daily.    Yes Historical Provider, MD  traMADol (ULTRAM) 50 MG tablet Take 1-2 tablets (50-100 mg total) by mouth 2 (two) times daily as needed. For pain. 10/28/10  Yes Sonda Primes, MD  glucose blood (ONE TOUCH ULTRA TEST) test strip 1 each by Other route daily as needed (DX 250.00). 07/16/10   Sonda Primes, MD  methocarbamol (ROBAXIN) 500 MG tablet Take 1 tablet (500 mg total) by mouth every 6 (six) hours as needed. 01/27/11  02/06/11  Yehia Mcbain Julien Girt, PA  nitroGLYCERIN (NITROSTAT) 0.4 MG SL tablet Place 1 tablet (0.4 mg total) under the tongue every 5 (five) minutes as needed  for chest pain. 08/03/10 08/03/11  Lewayne Bunting, MD  oxyCODONE (OXY IR/ROXICODONE) 5 MG immediate release tablet Take 1-2 tablets (5-10 mg total) by mouth every 3 (three) hours as needed. 01/27/11 02/06/11  Si Jachim Julien Girt, PA  rivaroxaban (XARELTO) 10 MG TABS tablet Take 1 tablet (10 mg total) by mouth daily. 01/27/11   Mattisen Pohlmann Julien Girt, PA    Diet:low sodium  Activity:PWB No bending hip over 90 degrees- A "L" Angle Do not cross legs Do not let foot roll inward  When turning these patients a pillow should be placed between the patient's legs to prevent crossing.  Patients should have the affected knee fully extended when trying to sit or stand from all surfaces to prevent excessive hip flexion.  When ambulating and turning toward the affected side the affected leg should have the toes turned out prior to moving the walker and the rest of patient's body as to prevent internal rotation/ turning in of the leg.  Abduction pillows are the most effective way to prevent a patient from not crossing legs or turning toes in at rest. If an abduction pillow is not ordered placing a regular pillow length wise between the patient's legs is also an effective reminder.  It is imperative that these precautions be maintained so that the surgical hip does not dislocate.    Follow-up:in 2 weeks  Disposition: Home  Discharged Condition: good   Discharge Orders    Future Appointments: Provider: Department: Dept Phone: Center:   03/16/2011 9:45 AM Sonda Primes, MD [facility] [phone] [facility]     Future Orders Please Complete By Expires   Diet - low sodium heart healthy      Call MD / Call 911      Comments:   If you experience chest pain or shortness of breath, CALL 911 and be transported to the hospital emergency room.  If you develope a fever above 101 F, pus (white drainage) or increased drainage or redness at the wound, or calf pain, call your surgeon's office.   Constipation Prevention        Comments:   Drink plenty of fluids.  Prune juice may be helpful.  You may use a stool softener, such as Colace (over the counter) 100 mg twice a day.  Use MiraLax (over the counter) for constipation as needed.   Increase activity slowly as tolerated      Weight Bearing as taught in Physical Therapy      Comments:   Use a walker or crutches as instructed.   Discharge instructions      Comments:   Pick up stool softner and laxative for home. Do not submerge incision under water. May shower. Hip precautions.  Total Hip Protocol.   Driving restrictions      Comments:   No driving   Lifting restrictions      Comments:   No lifting     Discharge Medication List as of 01/27/2011 12:26 PM    START taking these medications   Details  methocarbamol (ROBAXIN) 500 MG tablet Take 1 tablet (500 mg total) by mouth every 6 (six) hours as needed., Starting 01/27/2011, Until Sun 02/06/11, Print    oxyCODONE (OXY IR/ROXICODONE) 5 MG immediate release tablet Take 1-2 tablets (5-10 mg total) by mouth every 3 (three) hours as needed., Starting 01/27/2011, Until Sun 02/06/11,  Print    rivaroxaban (XARELTO) 10 MG TABS tablet Take 1 tablet (10 mg total) by mouth daily., Starting 01/27/2011, Until Discontinued, Print      CONTINUE these medications which have NOT CHANGED   Details  doxazosin (CARDURA) 8 MG tablet Starting 12/14/2010, Until Discontinued, Historical Med    enalapril (VASOTEC) 10 MG tablet  , Starting 11/29/2010, Until Discontinued, Historical Med    esomeprazole (NEXIUM) 40 MG capsule Take 40 mg by mouth daily before breakfast.  , Until Discontinued, Historical Med    glimepiride (AMARYL) 4 MG tablet Take 4 mg by mouth 1 day or 1 dose. , Until Discontinued, Historical Med    lovastatin (MEVACOR) 20 MG tablet TAKE ONE TABLET BY MOUTH EVERY DAY, Normal    metFORMIN (GLUCOPHAGE) 1000 MG tablet TAKE ONE TABLET BY MOUTH TWICE DAILY, Normal    omeprazole (PRILOSEC) 20 MG capsule Take 20  mg by mouth daily. , Until Discontinued, Historical Med    traMADol (ULTRAM) 50 MG tablet Take 1-2 tablets (50-100 mg total) by mouth 2 (two) times daily as needed. For pain., Starting 10/28/2010, Until Discontinued, Normal    glucose blood (ONE TOUCH ULTRA TEST) test strip 1 each by Other route daily as needed (DX 250.00)., Starting 07/16/2010, Until Discontinued, Normal    nitroGLYCERIN (NITROSTAT) 0.4 MG SL tablet Place 1 tablet (0.4 mg total) under the tongue every 5 (five) minutes as needed for chest pain., Starting 08/03/2010, Until Wed 08/03/11, Normal      STOP taking these medications     aspirin 325 MG tablet      cholecalciferol (VITAMIN D) 1000 UNITS tablet        Follow-up Information    Follow up with [name],FRANK V. Make an appointment in 2 weeks.   Contact information:   [facility] [address] [place] [address] [phone]          Signed: Patrica Duel 01/30/2011, 3:33 PM

## 2011-03-16 ENCOUNTER — Ambulatory Visit (INDEPENDENT_AMBULATORY_CARE_PROVIDER_SITE_OTHER): Payer: Medicare Other | Admitting: Internal Medicine

## 2011-03-16 ENCOUNTER — Encounter: Payer: Self-pay | Admitting: Internal Medicine

## 2011-03-16 DIAGNOSIS — M1612 Unilateral primary osteoarthritis, left hip: Secondary | ICD-10-CM

## 2011-03-16 DIAGNOSIS — M129 Arthropathy, unspecified: Secondary | ICD-10-CM

## 2011-03-16 DIAGNOSIS — M1610 Unilateral primary osteoarthritis, unspecified hip: Secondary | ICD-10-CM

## 2011-03-16 DIAGNOSIS — E785 Hyperlipidemia, unspecified: Secondary | ICD-10-CM

## 2011-03-16 DIAGNOSIS — I10 Essential (primary) hypertension: Secondary | ICD-10-CM

## 2011-03-16 DIAGNOSIS — E119 Type 2 diabetes mellitus without complications: Secondary | ICD-10-CM

## 2011-03-16 DIAGNOSIS — I2510 Atherosclerotic heart disease of native coronary artery without angina pectoris: Secondary | ICD-10-CM

## 2011-03-16 MED ORDER — GLUCOSE BLOOD VI STRP: ORAL_STRIP | Status: DC

## 2011-03-16 NOTE — Assessment & Plan Note (Signed)
Continue with current prescription therapy as reflected on the Med list.  

## 2011-03-16 NOTE — Progress Notes (Signed)
  Subjective:    Patient ID: Johnny [name], male    DOB: 23-Mar-1945, 66 y.o.   MRN: [id number]  HPI  The patient presents for a follow-up of  chronic hypertension, chronic dyslipidemia, type 2 diabetes controlled with medicines    Review of Systems  Constitutional: Negative for appetite change, fatigue and unexpected weight change.  HENT: Negative for nosebleeds, congestion, sore throat, sneezing, trouble swallowing and neck pain.   Eyes: Negative for itching and visual disturbance.  Respiratory: Negative for cough and shortness of breath.   Cardiovascular: Negative for chest pain, palpitations and leg swelling.  Gastrointestinal: Negative for nausea, diarrhea, blood in stool and abdominal distention.  Genitourinary: Negative for frequency and hematuria.  Musculoskeletal: Negative for back pain, joint swelling and gait problem.  Skin: Negative for rash.  Neurological: Negative for dizziness, tremors, speech difficulty and weakness.  Psychiatric/Behavioral: Negative for sleep disturbance, dysphoric mood and agitation. The patient is nervous/anxious.        Objective:   Physical Exam  Constitutional: He is oriented to person, place, and time. He appears well-developed.  HENT:  Mouth/Throat: Oropharynx is clear and moist.  Eyes: Conjunctivae are normal. Pupils are equal, round, and reactive to light.  Neck: Normal range of motion. No JVD present. No thyromegaly present.  Cardiovascular: Normal rate, regular rhythm, normal heart sounds and intact distal pulses.  Exam reveals no gallop and no friction rub.   No murmur heard. Pulmonary/Chest: Effort normal and breath sounds normal. No respiratory distress. He has no wheezes. He has no rales. He exhibits no tenderness.  Abdominal: Soft. Bowel sounds are normal. He exhibits no distension and no mass. There is no tenderness. There is no rebound and no guarding.  Musculoskeletal: Normal range of motion. He exhibits no edema and no  tenderness.  Lymphadenopathy:    He has no cervical adenopathy.  Neurological: He is alert and oriented to person, place, and time. He has normal reflexes. No cranial nerve deficit. He exhibits normal muscle tone. Coordination normal.  Skin: Skin is warm and dry. No rash noted.  Psychiatric: He has a normal mood and affect. His behavior is normal. Judgment and thought content normal.   Using a cane - limping a little       Assessment & Plan:

## 2011-03-16 NOTE — Assessment & Plan Note (Signed)
S/p L THR 12/12 - better He is done w/PT

## 2011-03-25 ENCOUNTER — Other Ambulatory Visit: Payer: Self-pay

## 2011-03-25 MED ORDER — ESOMEPRAZOLE MAGNESIUM 40 MG PO CPDR: 40.0000 mg | DELAYED_RELEASE_CAPSULE | Freq: Every day | ORAL | Status: DC

## 2011-03-29 ENCOUNTER — Other Ambulatory Visit: Payer: Self-pay

## 2011-03-29 MED ORDER — GLUCOSE BLOOD VI STRP: ORAL_STRIP | Status: DC

## 2011-04-01 ENCOUNTER — Other Ambulatory Visit: Payer: Self-pay | Admitting: *Deleted

## 2011-04-01 NOTE — Telephone Encounter (Signed)
Patient's wife is requesting the following medications to be printed & faxed to [facility] [first time use]: doxazosin, enalapril, glimepiride, lovastatin, metformin. Also, caller stating that she requested Nexium not be sent to pharmacy as it is not on covered med list, which was miscommunication apparently and they were charged $75 for this Rx, she is upset about this and request to speak to MD about this matter. She requested pantoprazole 40 mg to replace Nexium as this medication is covered [& omeprazole was subtherapeutic in past].

## 2011-04-04 MED ORDER — DOXAZOSIN MESYLATE 8 MG PO TABS: 8.0000 mg | ORAL_TABLET | Freq: Every day | ORAL | Status: DC

## 2011-04-04 MED ORDER — ENALAPRIL MALEATE 10 MG PO TABS: 10.0000 mg | ORAL_TABLET | Freq: Every day | ORAL | Status: DC

## 2011-04-04 MED ORDER — METFORMIN HCL 1000 MG PO TABS: ORAL_TABLET | ORAL | Status: DC

## 2011-04-04 MED ORDER — GLIMEPIRIDE 4 MG PO TABS: ORAL_TABLET | ORAL | Status: DC

## 2011-04-04 MED ORDER — LOVASTATIN 20 MG PO TABS: ORAL_TABLET | ORAL | Status: DC

## 2011-04-06 ENCOUNTER — Telehealth: Payer: Self-pay | Admitting: Internal Medicine

## 2011-04-06 MED ORDER — PANTOPRAZOLE SODIUM 40 MG PO TBEC: 40.0000 mg | DELAYED_RELEASE_TABLET | Freq: Every day | ORAL | Status: DC

## 2011-04-06 NOTE — Telephone Encounter (Signed)
meds for [facility] - Protonix was emailed x 12 mo

## 2011-04-07 NOTE — Telephone Encounter (Signed)
Refill called to [facility] at  [facility] for Pantoprazole #90 x 3 refills as e[facility] did not transmit.

## 2011-04-11 ENCOUNTER — Telehealth: Payer: Self-pay | Admitting: Internal Medicine

## 2011-04-11 NOTE — Telephone Encounter (Signed)
The pt's wife called and is hoping to get a refill of the One Touch test strips sent to the [facility] on [address]  She stated that since they were using Medicare part B, the pharmacy needed a diagnosis code, amount, and times per day the strips are being used.    Thanks!   Wife's callback - [phone]

## 2011-04-12 MED ORDER — GLUCOSE BLOOD VI STRP: ORAL_STRIP | Status: DC

## 2011-04-12 NOTE — Telephone Encounter (Signed)
Pt's wife informed rx sent.

## 2011-06-23 ENCOUNTER — Ambulatory Visit: Payer: Medicare Other | Admitting: Internal Medicine

## 2011-07-07 ENCOUNTER — Ambulatory Visit (INDEPENDENT_AMBULATORY_CARE_PROVIDER_SITE_OTHER): Payer: Medicare Other | Admitting: Internal Medicine

## 2011-07-07 ENCOUNTER — Encounter: Payer: Self-pay | Admitting: Internal Medicine

## 2011-07-07 VITALS — BP 140/76 | HR 72 | Temp 97.6°F | Resp 16 | Wt 242.0 lb

## 2011-07-07 DIAGNOSIS — I2510 Atherosclerotic heart disease of native coronary artery without angina pectoris: Secondary | ICD-10-CM

## 2011-07-07 DIAGNOSIS — K219 Gastro-esophageal reflux disease without esophagitis: Secondary | ICD-10-CM

## 2011-07-07 DIAGNOSIS — E119 Type 2 diabetes mellitus without complications: Secondary | ICD-10-CM

## 2011-07-07 DIAGNOSIS — M129 Arthropathy, unspecified: Secondary | ICD-10-CM

## 2011-07-07 NOTE — Progress Notes (Signed)
Patient ID: Johnny [name], male   DOB: 1945-06-23, 66 y.o.   MRN: [id number]  Subjective:    Patient ID: Johnny [name], male    DOB: April 29, 1945, 66 y.o.   MRN: [id number]  HPI  The patient presents for a follow-up of  chronic hypertension, chronic dyslipidemia, type 2 diabetes controlled with medicines  Wt Readings from Last 3 Encounters:  07/07/11 242 lb (109.77 kg)  03/16/11 236 lb (107.049 kg)  01/24/11 238 lb (107.956 kg)   BP Readings from Last 3 Encounters:  07/07/11 140/76  03/16/11 120/60  01/27/11 151/69       Review of Systems  Constitutional: Negative for appetite change, fatigue and unexpected weight change.  HENT: Negative for nosebleeds, congestion, sore throat, sneezing, trouble swallowing and neck pain.   Eyes: Negative for itching and visual disturbance.  Respiratory: Negative for cough and shortness of breath.   Cardiovascular: Negative for chest pain, palpitations and leg swelling.  Gastrointestinal: Negative for nausea, diarrhea, blood in stool and abdominal distention.  Genitourinary: Negative for frequency and hematuria.  Musculoskeletal: Negative for back pain, joint swelling and gait problem.  Skin: Negative for rash.  Neurological: Negative for dizziness, tremors, speech difficulty and weakness.  Psychiatric/Behavioral: Negative for sleep disturbance, dysphoric mood and agitation. The patient is nervous/anxious.        Objective:   Physical Exam  Constitutional: He is oriented to person, place, and time. He appears well-developed.  HENT:  Mouth/Throat: Oropharynx is clear and moist.  Eyes: Conjunctivae are normal. Pupils are equal, round, and reactive to light.  Neck: Normal range of motion. No JVD present. No thyromegaly present.  Cardiovascular: Normal rate, regular rhythm, normal heart sounds and intact distal pulses.  Exam reveals no gallop and no friction rub.   No murmur heard. Pulmonary/Chest: Effort normal and breath sounds normal. No  respiratory distress. He has no wheezes. He has no rales. He exhibits no tenderness.  Abdominal: Soft. Bowel sounds are normal. He exhibits no distension and no mass. There is no tenderness. There is no rebound and no guarding.  Musculoskeletal: Normal range of motion. He exhibits no edema and no tenderness.  Lymphadenopathy:    He has no cervical adenopathy.  Neurological: He is alert and oriented to person, place, and time. He has normal reflexes. No cranial nerve deficit. He exhibits normal muscle tone. Coordination normal.  Skin: Skin is warm and dry. No rash noted.  Psychiatric: He has a normal mood and affect. His behavior is normal. Judgment and thought content normal.   Using a cane - limping a little  Lab Results  Component Value Date   WBC 10.1 01/27/2011   HGB 11.4* 01/27/2011   HCT 34.1* 01/27/2011   PLT 236 01/27/2011   GLUCOSE 81 01/27/2011   CHOL 141 10/14/2010   TRIG 106.0 10/14/2010   HDL 40.50 10/14/2010   LDLDIRECT 78.1 09/16/2008   LDLCALC 79 10/14/2010   ALT 14 01/13/2011   AST 13 01/13/2011   NA 137 01/27/2011   K 3.5 01/27/2011   CL 100 01/27/2011   CREATININE 0.89 01/27/2011   BUN 10 01/27/2011   CO2 29 01/27/2011   TSH 3.59 10/14/2010   PSA 1.28 10/24/2008   INR 1.19 01/13/2011   HGBA1C 6.9* 10/14/2010        Assessment & Plan:

## 2011-07-10 NOTE — Assessment & Plan Note (Signed)
Continue with current prescription therapy as reflected on the Med list.  

## 2011-07-14 ENCOUNTER — Other Ambulatory Visit (INDEPENDENT_AMBULATORY_CARE_PROVIDER_SITE_OTHER): Payer: Medicare Other

## 2011-07-14 DIAGNOSIS — E119 Type 2 diabetes mellitus without complications: Secondary | ICD-10-CM

## 2011-07-14 DIAGNOSIS — M1610 Unilateral primary osteoarthritis, unspecified hip: Secondary | ICD-10-CM

## 2011-07-14 DIAGNOSIS — I10 Essential (primary) hypertension: Secondary | ICD-10-CM

## 2011-07-14 DIAGNOSIS — I2510 Atherosclerotic heart disease of native coronary artery without angina pectoris: Secondary | ICD-10-CM

## 2011-07-14 DIAGNOSIS — M129 Arthropathy, unspecified: Secondary | ICD-10-CM

## 2011-07-14 DIAGNOSIS — E785 Hyperlipidemia, unspecified: Secondary | ICD-10-CM

## 2011-07-14 DIAGNOSIS — M1612 Unilateral primary osteoarthritis, left hip: Secondary | ICD-10-CM

## 2011-07-14 LAB — HEPATIC FUNCTION PANEL
ALT: 13 U/L (ref 0–53)
AST: 16 U/L (ref 0–37)
Alkaline Phosphatase: 81 U/L (ref 39–117)
Bilirubin, Direct: 0.1 mg/dL (ref 0.0–0.3)
Total Bilirubin: 0.5 mg/dL (ref 0.3–1.2)

## 2011-07-14 LAB — LIPID PANEL
LDL Cholesterol: 90 mg/dL (ref 0–99)
Total CHOL/HDL Ratio: 4
VLDL: 14 mg/dL (ref 0.0–40.0)

## 2011-07-14 LAB — BASIC METABOLIC PANEL
CO2: 26 mEq/L (ref 19–32)
Chloride: 109 mEq/L (ref 96–112)
Creatinine, Ser: 0.9 mg/dL (ref 0.4–1.5)
Potassium: 4.5 mEq/L (ref 3.5–5.1)

## 2011-07-14 LAB — CBC WITH DIFFERENTIAL/PLATELET
Basophils Absolute: 0.1 10*3/uL (ref 0.0–0.1)
Eosinophils Absolute: 0.1 10*3/uL (ref 0.0–0.7)
Hemoglobin: 11.8 g/dL — ABNORMAL LOW (ref 13.0–17.0)
Lymphocytes Relative: 32 % (ref 12.0–46.0)
MCHC: 32.9 g/dL (ref 30.0–36.0)
Monocytes Relative: 8.2 % (ref 3.0–12.0)
Neutro Abs: 3.3 10*3/uL (ref 1.4–7.7)
Neutrophils Relative %: 56.7 % (ref 43.0–77.0)
RBC: 4.3 Mil/uL (ref 4.22–5.81)
RDW: 15.5 % — ABNORMAL HIGH (ref 11.5–14.6)

## 2011-07-14 LAB — HEMOGLOBIN A1C: Hgb A1c MFr Bld: 6.8 % — ABNORMAL HIGH (ref 4.6–6.5)

## 2011-08-10 ENCOUNTER — Other Ambulatory Visit: Payer: Self-pay | Admitting: *Deleted

## 2011-08-10 MED ORDER — ENALAPRIL MALEATE 10 MG PO TABS: 10.0000 mg | ORAL_TABLET | Freq: Every day | ORAL | Status: DC

## 2011-08-10 MED ORDER — METFORMIN HCL 1000 MG PO TABS: ORAL_TABLET | ORAL | Status: DC

## 2011-08-10 MED ORDER — GLIMEPIRIDE 4 MG PO TABS: ORAL_TABLET | ORAL | Status: DC

## 2011-08-10 MED ORDER — LOVASTATIN 20 MG PO TABS: ORAL_TABLET | ORAL | Status: DC

## 2011-08-10 MED ORDER — DOXAZOSIN MESYLATE 8 MG PO TABS: 8.0000 mg | ORAL_TABLET | Freq: Every day | ORAL | Status: DC

## 2011-08-19 ENCOUNTER — Ambulatory Visit (INDEPENDENT_AMBULATORY_CARE_PROVIDER_SITE_OTHER): Payer: Medicare Other | Admitting: Internal Medicine

## 2011-08-19 ENCOUNTER — Encounter: Payer: Self-pay | Admitting: Internal Medicine

## 2011-08-19 VITALS — BP 110/70 | HR 72 | Temp 98.2°F | Resp 16 | Wt 241.0 lb

## 2011-08-19 DIAGNOSIS — R220 Localized swelling, mass and lump, head: Secondary | ICD-10-CM

## 2011-08-19 DIAGNOSIS — L0211 Cutaneous abscess of neck: Secondary | ICD-10-CM | POA: Insufficient documentation

## 2011-08-19 DIAGNOSIS — R221 Localized swelling, mass and lump, neck: Secondary | ICD-10-CM

## 2011-08-19 MED ORDER — MUPIROCIN 2 % EX OINT: TOPICAL_OINTMENT | CUTANEOUS | Status: AC

## 2011-08-19 MED ORDER — DOXYCYCLINE HYCLATE 100 MG PO TABS: 100.0000 mg | ORAL_TABLET | Freq: Two times a day (BID) | ORAL | Status: AC

## 2011-08-19 NOTE — Progress Notes (Signed)
  Subjective:    Patient ID: Johnny [name], male    DOB: [birth year], 66 y.o.   MRN: [id number]  HPI  C/o swelling on the neck x 3 wks - painful  Review of Systems  Constitutional: Negative for fever and chills.       Objective:   Physical Exam  Constitutional: He appears well-developed. No distress.  Cardiovascular: Normal rate and normal heart sounds.   Musculoskeletal: He exhibits no edema.  Skin: He is not diaphoretic.     3x3 cm swelling, firm on post dist neck, tender  Procedure Note :    Procedure :   Point of care (POC) sonography examination   Indication: post neck mass   Equipment used: Sonosite M-Turbo with HFL38x/13-6 MHz transducer linear probe. The images were stored in the unit and later transferred in storage.  The patient was placed in a decubitus position.  This study revealed a hypoechoic cavitary lesion in the dist post neck with heterogenous content   Impression: Skin abscess, post neck     Procedure note:  Incision and Drainage of an Abscess   Indication : a localized collection of pus that is tender and not spontaneously resolving.    Risks including unsuccessful procedure , possible need for a repeat procedure due to pus accumulation, scar formation, and others as well as benefits were explained to the patient in detail. Written consent was obtained/signed.    The patient was placed in a decubitus position. The area of an abscess on post neck was prepped with povidone-iodine and draped in a sterile fashion. Local anesthesia with   2    cc of 2% lidocaine and epinephrine  was administered.  1.5 cm incision with #11strait blade was made. About 3 cc of purulent material was expressed. The abscess cavity was explored with a sterile hemostat and the walled- off pockets and septae were broken down bluntly. The cavity was irrigated with the rest of the anesthetic in the syringe and packed with 3 inches of  the iodoform gauze.   The wound was  dressed with antibiotic ointment and Telfa pad.  Tolerated well. Complications: None.   Wound instructions provided.    Assessment & Plan:

## 2011-08-19 NOTE — Patient Instructions (Addendum)

## 2011-08-19 NOTE — Assessment & Plan Note (Addendum)
[place] I&D Doxy x 2 wks Mupirocin Surg cons for complete removal of the cyst was discussed

## 2011-09-30 ENCOUNTER — Ambulatory Visit (INDEPENDENT_AMBULATORY_CARE_PROVIDER_SITE_OTHER): Payer: Medicare Other | Admitting: Internal Medicine

## 2011-09-30 ENCOUNTER — Encounter: Payer: Self-pay | Admitting: Internal Medicine

## 2011-09-30 VITALS — BP 130/70 | HR 80 | Temp 97.2°F | Resp 16 | Wt 247.0 lb

## 2011-09-30 DIAGNOSIS — L0211 Cutaneous abscess of neck: Secondary | ICD-10-CM

## 2011-09-30 DIAGNOSIS — E119 Type 2 diabetes mellitus without complications: Secondary | ICD-10-CM

## 2011-09-30 MED ORDER — DOXYCYCLINE HYCLATE 100 MG PO TABS: 100.0000 mg | ORAL_TABLET | Freq: Two times a day (BID) | ORAL | Status: DC

## 2011-09-30 MED ORDER — DOXYCYCLINE HYCLATE 100 MG PO TABS: 100.0000 mg | ORAL_TABLET | Freq: Two times a day (BID) | ORAL | Status: AC

## 2011-09-30 NOTE — Progress Notes (Signed)
  Subjective:    Patient ID: Johnny [name], male    DOB: 11/20/1945, 66 y.o.   MRN: [id number]  HPI  F/u a cyst on posterior neck - doing better F/u DM-2  Review of Systems  Constitutional: Negative for fever and chills.  Skin: Negative for rash and wound.  Neurological: Negative for light-headedness.       Objective:   Physical Exam  Constitutional: He appears well-developed.       Obese   Cardiovascular: Normal rate and regular rhythm.  Exam reveals no gallop.   Pulmonary/Chest: He has no wheezes. He exhibits no tenderness.  Skin: No rash noted. No erythema. No pallor.       No palpable mass on post neck          Assessment & Plan:

## 2011-09-30 NOTE — Assessment & Plan Note (Signed)
Continue with current prescription therapy as reflected on the Med list. Labs reviewed 

## 2011-09-30 NOTE — Assessment & Plan Note (Signed)
I do not palpate any mass.  Doxy Rx to have on hand Surg cons if cyst started to develop again

## 2011-11-10 ENCOUNTER — Ambulatory Visit: Payer: Medicare Other | Admitting: Internal Medicine

## 2011-12-16 ENCOUNTER — Other Ambulatory Visit (INDEPENDENT_AMBULATORY_CARE_PROVIDER_SITE_OTHER): Payer: Medicare Other

## 2011-12-16 ENCOUNTER — Encounter: Payer: Self-pay | Admitting: Internal Medicine

## 2011-12-16 ENCOUNTER — Ambulatory Visit (INDEPENDENT_AMBULATORY_CARE_PROVIDER_SITE_OTHER): Payer: Medicare Other | Admitting: Internal Medicine

## 2011-12-16 VITALS — BP 130/68 | HR 76 | Temp 98.4°F | Resp 16 | Wt 251.0 lb

## 2011-12-16 DIAGNOSIS — E785 Hyperlipidemia, unspecified: Secondary | ICD-10-CM

## 2011-12-16 DIAGNOSIS — L0211 Cutaneous abscess of neck: Secondary | ICD-10-CM

## 2011-12-16 DIAGNOSIS — E119 Type 2 diabetes mellitus without complications: Secondary | ICD-10-CM

## 2011-12-16 DIAGNOSIS — I10 Essential (primary) hypertension: Secondary | ICD-10-CM

## 2011-12-16 DIAGNOSIS — I2510 Atherosclerotic heart disease of native coronary artery without angina pectoris: Secondary | ICD-10-CM

## 2011-12-16 DIAGNOSIS — Z23 Encounter for immunization: Secondary | ICD-10-CM

## 2011-12-16 LAB — HEMOGLOBIN A1C: Hgb A1c MFr Bld: 7 % — ABNORMAL HIGH (ref 4.6–6.5)

## 2011-12-16 NOTE — Assessment & Plan Note (Signed)
Continue with current prescription therapy as reflected on the Med list.  

## 2011-12-16 NOTE — Progress Notes (Signed)
   Subjective:    Patient ID: Johnny [name], male    DOB: October 07, 1945, 66 y.o.   MRN: [id number]  HPI  The patient presents for a follow-up of  chronic hypertension, chronic dyslipidemia, type 2 diabetes controlled with medicines  C/o neck pain  Wt Readings from Last 3 Encounters:  12/16/11 251 lb (113.853 kg)  09/30/11 247 lb (112.038 kg)  08/19/11 241 lb (109.317 kg)   BP Readings from Last 3 Encounters:  12/16/11 130/68  09/30/11 130/70  08/19/11 110/70      Review of Systems  Constitutional: Negative for appetite change, fatigue and unexpected weight change.  HENT: Negative for nosebleeds, congestion, sore throat, sneezing, trouble swallowing and neck pain.   Eyes: Negative for itching and visual disturbance.  Respiratory: Negative for cough and shortness of breath.   Cardiovascular: Negative for chest pain, palpitations and leg swelling.  Gastrointestinal: Negative for nausea, diarrhea, blood in stool and abdominal distention.  Genitourinary: Negative for frequency and hematuria.  Musculoskeletal: Negative for back pain, joint swelling and gait problem.  Skin: Negative for rash.  Neurological: Negative for dizziness, tremors, speech difficulty and weakness.  Psychiatric/Behavioral: Negative for disturbed wake/sleep cycle, dysphoric mood and agitation. The patient is nervous/anxious.        Objective:   Physical Exam  Constitutional: He is oriented to person, place, and time. He appears well-developed.  HENT:  Mouth/Throat: Oropharynx is clear and moist.  Eyes: Conjunctivae normal are normal. Pupils are equal, round, and reactive to light.  Neck: Normal range of motion. No JVD present. No thyromegaly present.  Cardiovascular: Normal rate, regular rhythm, normal heart sounds and intact distal pulses.  Exam reveals no gallop and no friction rub.   No murmur heard. Pulmonary/Chest: Effort normal and breath sounds normal. No respiratory distress. He has no wheezes. He  has no rales. He exhibits no tenderness.  Abdominal: Soft. Bowel sounds are normal. He exhibits no distension and no mass. There is no tenderness. There is no rebound and no guarding.  Musculoskeletal: Normal range of motion. He exhibits no edema and no tenderness.  Lymphadenopathy:    He has no cervical adenopathy.  Neurological: He is alert and oriented to person, place, and time. He has normal reflexes. No cranial nerve deficit. He exhibits normal muscle tone. Coordination normal.  Skin: Skin is warm and dry. No rash noted.  Psychiatric: He has a normal mood and affect. His behavior is normal. Judgment and thought content normal.   Using a cane - limping a little  Lab Results  Component Value Date   WBC 5.9 07/14/2011   HGB 11.8* 07/14/2011   HCT 35.8* 07/14/2011   PLT 247.0 07/14/2011   GLUCOSE 136* 07/14/2011   CHOL 136 07/14/2011   TRIG 70.0 07/14/2011   HDL 32.50* 07/14/2011   LDLDIRECT 78.1 09/16/2008   LDLCALC 90 07/14/2011   ALT 13 07/14/2011   AST 16 07/14/2011   NA 142 07/14/2011   K 4.5 07/14/2011   CL 109 07/14/2011   CREATININE 0.9 07/14/2011   BUN 11 07/14/2011   CO2 26 07/14/2011   TSH 3.59 10/14/2010   PSA 1.28 10/24/2008   INR 1.19 01/13/2011   HGBA1C 6.8* 07/14/2011        Assessment & Plan:

## 2011-12-19 LAB — BASIC METABOLIC PANEL
Calcium: 8.6 mg/dL (ref 8.4–10.5)
GFR: 93.64 mL/min (ref >60.00)
Sodium: 140 mEq/L (ref 135–145)

## 2012-02-07 ENCOUNTER — Ambulatory Visit (INDEPENDENT_AMBULATORY_CARE_PROVIDER_SITE_OTHER): Payer: Medicare Other | Admitting: Internal Medicine

## 2012-02-07 ENCOUNTER — Encounter: Payer: Self-pay | Admitting: Internal Medicine

## 2012-02-07 VITALS — BP 124/88 | HR 80 | Temp 97.1°F | Resp 16 | Wt 252.0 lb

## 2012-02-07 DIAGNOSIS — R42 Dizziness and giddiness: Secondary | ICD-10-CM

## 2012-02-07 MED ORDER — MECLIZINE HCL 12.5 MG PO TABS: 12.5000 mg | ORAL_TABLET | Freq: Three times a day (TID) | ORAL | Status: DC | PRN

## 2012-02-07 NOTE — Progress Notes (Signed)
   Subjective:    Patient ID: Johnny [name], male    DOB: 12-Nov-1945, 66 y.o.   MRN: [id number] C/o dizziness and nausea x 2 d off and on worse w/ Neck Pain  Pertinent negatives include no chest pain, trouble swallowing or weakness.    The patient presents for a follow-up of  chronic hypertension, chronic dyslipidemia, type 2 diabetes controlled with medicines  C/o neck pain  Wt Readings from Last 3 Encounters:  02/07/12 252 lb (114.306 kg)  12/16/11 251 lb (113.853 kg)  09/30/11 247 lb (112.038 kg)   BP Readings from Last 3 Encounters:  02/07/12 124/88  12/16/11 130/68  09/30/11 130/70      Review of Systems  Constitutional: Negative for appetite change, fatigue and unexpected weight change.  HENT: Positive for neck pain. Negative for nosebleeds, congestion, sore throat, sneezing and trouble swallowing.   Eyes: Negative for itching and visual disturbance.  Respiratory: Negative for cough and shortness of breath.   Cardiovascular: Negative for chest pain, palpitations and leg swelling.  Gastrointestinal: Negative for nausea, diarrhea, blood in stool and abdominal distention.  Genitourinary: Negative for frequency and hematuria.  Musculoskeletal: Negative for back pain, joint swelling and gait problem.  Skin: Negative for rash.  Neurological: Negative for dizziness, tremors, speech difficulty and weakness.  Psychiatric/Behavioral: Negative for sleep disturbance, dysphoric mood and agitation. The patient is nervous/anxious.        Objective:   Physical Exam  Constitutional: He is oriented to person, place, and time. He appears well-developed.  HENT:  Mouth/Throat: Oropharynx is clear and moist.  Eyes: Conjunctivae normal are normal. Pupils are equal, round, and reactive to light.  Neck: Normal range of motion. No JVD present. No thyromegaly present.  Cardiovascular: Normal rate, regular rhythm, normal heart sounds and intact distal pulses.  Exam reveals no gallop and no  friction rub.   No murmur heard. Pulmonary/Chest: Effort normal and breath sounds normal. No respiratory distress. He has no wheezes. He has no rales. He exhibits no tenderness.  Abdominal: Soft. Bowel sounds are normal. He exhibits no distension and no mass. There is no tenderness. There is no rebound and no guarding.  Musculoskeletal: Normal range of motion. He exhibits no edema and no tenderness.  Lymphadenopathy:    He has no cervical adenopathy.  Neurological: He is alert and oriented to person, place, and time. He has normal reflexes. No cranial nerve deficit. He exhibits normal muscle tone. Coordination normal.  Skin: Skin is warm and dry. No rash noted.  Psychiatric: He has a normal mood and affect. His behavior is normal. Judgment and thought content normal.   H-P is positive on the L  Lab Results  Component Value Date   WBC 5.9 07/14/2011   HGB 11.8* 07/14/2011   HCT 35.8* 07/14/2011   PLT 247.0 07/14/2011   GLUCOSE 94 12/16/2011   CHOL 136 07/14/2011   TRIG 70.0 07/14/2011   HDL 32.50* 07/14/2011   LDLDIRECT 78.1 09/16/2008   LDLCALC 90 07/14/2011   ALT 13 07/14/2011   AST 16 07/14/2011   NA 140 12/16/2011   K 3.9 12/16/2011   CL 105 12/16/2011   CREATININE 1.0 12/16/2011   BUN 11 12/16/2011   CO2 27 12/16/2011   TSH 3.59 10/14/2010   PSA 1.28 10/24/2008   INR 1.19 01/13/2011   HGBA1C 7.0* 12/16/2011        Assessment & Plan:

## 2012-02-08 ENCOUNTER — Encounter: Payer: Self-pay | Admitting: Internal Medicine

## 2012-02-08 DIAGNOSIS — R42 Dizziness and giddiness: Secondary | ICD-10-CM | POA: Insufficient documentation

## 2012-02-08 NOTE — Assessment & Plan Note (Signed)
Benign Positional Vertigo symptoms on the left. Start Meclizine. Start Brandt - Daroff exercise several times a day as dirrected. 

## 2012-02-09 ENCOUNTER — Telehealth: Payer: Self-pay | Admitting: Internal Medicine

## 2012-02-09 NOTE — Telephone Encounter (Signed)
Patient Information:  Caller Name: Hilmer Aliberti  Phone: [phone]  Patient: Johnny [name], Johnny [name]  Gender: Male  DOB: May 16, 1945  Age: 66 Years  PCP: Plotnikov, Alex (Adults only)   Symptoms  Reason For Call & Symptoms: Wife states that her husband was in the office on Monday 02/06/12 and diagnosed with Vertigo. She mentioned this to Dr. Posey Rea but they were focused on the vertigo.  She states he has pain is located in the back on patients neck/head and radiates a "Shooting pain" to his forehead.  History of stiff neck that has improved with exercise.  Reviewed Health History In EMR: Yes  Reviewed Medications In EMR: Yes  Reviewed Allergies In EMR: Yes  Reviewed Surgeries / Procedures: No  Date of Onset of Symptoms: 02/06/2012  Treatments Tried: Rubbed with Green alcohol and muscle rubs.  Treatments Tried Worked: No  Guideline(s) Used:  Neck Pain or Stiffness  Disposition Per Guideline:   See Within 3 Days in Office  Reason For Disposition Reached:   Neck pain persists > 2 weeks  Advice Given:  Reassurance:  Prolonged turning of the head or working in an awkward position can cause muscle pain in the back of the neck. With treatment, the pain usually resolves in 1 to 2 weeks.  Here is some care advice that should help.  Apply Cold to the Area Or Heat:   During the first 2 days after a mild injury, apply a cold pack or an ice bag (wrapped in a towel) for 20 minutes four times a day. After 2 days, apply a heating pad or hot water bottle to the most painful area for 20 minutes whenever the pain flares up. Wrap hot water bottles or heating pads in a towel to avoid burns.  Sleep:  Sleep on your back or side, not on your abdomen.  Stretching Exercises:  After 48 hours of protecting the neck, begin gentle stretching exercises.  Improve the tone of the neck muscles with 2 or 3 minutes of gentle stretching exercises per day such as touching the chin to each shoulder, touching the ear to  each shoulder, and moving the head forward and backward.  Good Body Mechanics:  Lifting: Stand close to the object to be lifted. Keep your back straight and lift by bending your legs. Ask for help if needed.  Sleeping: Sleep on a firm mattress.  Posture: Maintain good posture.  Call Back If:  Numbness or weakness occurs  Bowel or bladder problems occur  Pain lasts for more than 2 weeks  You become worse.  Office Follow Up:  Does the office need to follow up with this patient?: Yes  Instructions For The Office: Patient in office on Monday for Vertigo and neck pain. Continues to have neck pain. Dr. Posey Rea did not tell them what to do about neck pain.  RN Note:  Wife wants to know what Dr. Posey Rea suggests since they were focused on Vertigo in the office. She is concerned that the pain shoots to his head. Home care instructions reviewed.

## 2012-02-10 NOTE — Telephone Encounter (Signed)
Try Tylenol prn for HA. OV if not better Thx

## 2012-02-10 NOTE — Telephone Encounter (Signed)
Pt informed

## 2012-02-14 ENCOUNTER — Other Ambulatory Visit: Payer: Self-pay | Admitting: *Deleted

## 2012-02-14 MED ORDER — LOVASTATIN 20 MG PO TABS: ORAL_TABLET | ORAL | Status: DC

## 2012-02-14 MED ORDER — ENALAPRIL MALEATE 10 MG PO TABS: 10.0000 mg | ORAL_TABLET | Freq: Every day | ORAL | Status: DC

## 2012-02-14 MED ORDER — DOXAZOSIN MESYLATE 8 MG PO TABS: 8.0000 mg | ORAL_TABLET | Freq: Every day | ORAL | Status: DC

## 2012-02-14 MED ORDER — GLIMEPIRIDE 4 MG PO TABS: ORAL_TABLET | ORAL | Status: DC

## 2012-03-08 ENCOUNTER — Ambulatory Visit: Payer: Medicare Other | Admitting: Internal Medicine

## 2012-03-20 ENCOUNTER — Other Ambulatory Visit (INDEPENDENT_AMBULATORY_CARE_PROVIDER_SITE_OTHER): Payer: Medicare Other

## 2012-03-20 ENCOUNTER — Encounter: Payer: Self-pay | Admitting: Internal Medicine

## 2012-03-20 ENCOUNTER — Ambulatory Visit (INDEPENDENT_AMBULATORY_CARE_PROVIDER_SITE_OTHER): Payer: Medicare Other | Admitting: Internal Medicine

## 2012-03-20 VITALS — BP 120/62 | HR 80 | Temp 98.1°F | Resp 16 | Ht 71.0 in | Wt 249.0 lb

## 2012-03-20 DIAGNOSIS — E119 Type 2 diabetes mellitus without complications: Secondary | ICD-10-CM

## 2012-03-20 DIAGNOSIS — Z23 Encounter for immunization: Secondary | ICD-10-CM

## 2012-03-20 DIAGNOSIS — I259 Chronic ischemic heart disease, unspecified: Secondary | ICD-10-CM

## 2012-03-20 DIAGNOSIS — M129 Arthropathy, unspecified: Secondary | ICD-10-CM

## 2012-03-20 DIAGNOSIS — E785 Hyperlipidemia, unspecified: Secondary | ICD-10-CM

## 2012-03-20 DIAGNOSIS — I2510 Atherosclerotic heart disease of native coronary artery without angina pectoris: Secondary | ICD-10-CM

## 2012-03-20 DIAGNOSIS — I10 Essential (primary) hypertension: Secondary | ICD-10-CM

## 2012-03-20 LAB — BASIC METABOLIC PANEL
BUN: 11 mg/dL (ref 6–23)
Calcium: 9 mg/dL (ref 8.4–10.5)
Creatinine, Ser: 1 mg/dL (ref 0.4–1.5)
GFR: 96.85 mL/min (ref >60.00)
Glucose, Bld: 94 mg/dL (ref 70–99)
Potassium: 3.9 mEq/L (ref 3.5–5.1)

## 2012-03-20 NOTE — Assessment & Plan Note (Signed)
Continue with current prescription therapy as reflected on the Med list.  

## 2012-03-20 NOTE — Progress Notes (Signed)
Subjective:    F/u on dizziness and nausea - resolved after 3 wks  Neck Pain  This is a chronic problem. The current episode started more than 1 year ago. The problem has been waxing and waning. The pain is at a severity of 5/10. The pain is moderate. Pertinent negatives include no chest pain, trouble swallowing or weakness.    The patient presents for a follow-up of  chronic hypertension, chronic dyslipidemia, type 2 diabetes controlled with medicines    Wt Readings from Last 3 Encounters:  03/20/12 249 lb (112.946 kg)  02/07/12 252 lb (114.306 kg)  12/16/11 251 lb (113.853 kg)   BP Readings from Last 3 Encounters:  03/20/12 120/62  02/07/12 124/88  12/16/11 130/68      Review of Systems  Constitutional: Negative for appetite change, fatigue and unexpected weight change.  HENT: Positive for neck pain. Negative for nosebleeds, congestion, sore throat, sneezing and trouble swallowing.   Eyes: Negative for itching and visual disturbance.  Respiratory: Negative for cough and shortness of breath.   Cardiovascular: Negative for chest pain, palpitations and leg swelling.  Gastrointestinal: Negative for nausea, diarrhea, blood in stool and abdominal distention.  Genitourinary: Negative for frequency and hematuria.  Musculoskeletal: Negative for back pain, joint swelling and gait problem.  Skin: Negative for rash.  Neurological: Negative for dizziness, tremors, speech difficulty and weakness.  Psychiatric/Behavioral: Negative for sleep disturbance, dysphoric mood and agitation. The patient is not nervous/anxious.        Objective:   Physical Exam  Constitutional: He is oriented to person, place, and time. He appears well-developed.       Obese   HENT:  Mouth/Throat: Oropharynx is clear and moist.  Eyes: Conjunctivae normal are normal. Pupils are equal, round, and reactive to light.  Neck: Normal range of motion. No JVD present. No thyromegaly present.  Cardiovascular:  Normal rate, regular rhythm, normal heart sounds and intact distal pulses.  Exam reveals no gallop and no friction rub.   No murmur heard. Pulmonary/Chest: Effort normal and breath sounds normal. No respiratory distress. He has no wheezes. He has no rales. He exhibits no tenderness.  Abdominal: Soft. Bowel sounds are normal. He exhibits no distension and no mass. There is no tenderness. There is no rebound and no guarding.  Musculoskeletal: Normal range of motion. He exhibits no edema and no tenderness.  Lymphadenopathy:    He has no cervical adenopathy.  Neurological: He is alert and oriented to person, place, and time. He has normal reflexes. No cranial nerve deficit. He exhibits normal muscle tone. Coordination normal.  Skin: Skin is warm and dry. No rash noted.  Psychiatric: He has a normal mood and affect. His behavior is normal. Judgment and thought content normal.    Lab Results  Component Value Date   WBC 5.9 07/14/2011   HGB 11.8* 07/14/2011   HCT 35.8* 07/14/2011   PLT 247.0 07/14/2011   GLUCOSE 94 12/16/2011   CHOL 136 07/14/2011   TRIG 70.0 07/14/2011   HDL 32.50* 07/14/2011   LDLDIRECT 78.1 09/16/2008   LDLCALC 90 07/14/2011   ALT 13 07/14/2011   AST 16 07/14/2011   NA 140 12/16/2011   K 3.9 12/16/2011   CL 105 12/16/2011   CREATININE 1.0 12/16/2011   BUN 11 12/16/2011   CO2 27 12/16/2011   TSH 3.59 10/14/2010   PSA 1.28 10/24/2008   INR 1.19 01/13/2011   HGBA1C 7.0* 12/16/2011        Assessment &  Plan:

## 2012-03-22 ENCOUNTER — Encounter: Payer: Self-pay | Admitting: Internal Medicine

## 2012-03-22 ENCOUNTER — Ambulatory Visit (INDEPENDENT_AMBULATORY_CARE_PROVIDER_SITE_OTHER): Payer: Medicare Other | Admitting: Internal Medicine

## 2012-03-22 VITALS — BP 124/73 | HR 73 | Ht 71.0 in | Wt 249.8 lb

## 2012-03-22 DIAGNOSIS — I259 Chronic ischemic heart disease, unspecified: Secondary | ICD-10-CM

## 2012-03-22 DIAGNOSIS — I10 Essential (primary) hypertension: Secondary | ICD-10-CM

## 2012-03-22 NOTE — Assessment & Plan Note (Signed)
The patient denies anginal symptoms. He will continue his current medical therapy. 

## 2012-03-22 NOTE — Progress Notes (Signed)
HPI Johnny [name] returns today for followup. He is a very pleasant 67 year old man with a history of hypertension, coronary disease, dyslipidemia, and diabetes. The patient has been increasing his physical activity and trying to lose weight. He recently underwent hip replacement surgery. He denies chest pain or shortness of breath. No peripheral edema. No syncope. In the interim, the patient states that he is been doing aerobic activity. Allergies  Allergen Reactions  . Hydrocodone-Acetaminophen     REACTION: nausea     Current Outpatient Prescriptions  Medication Sig Dispense Refill  . doxazosin (CARDURA) 8 MG tablet Take 1 tablet (8 mg total) by mouth at bedtime.  90 tablet  1  . enalapril (VASOTEC) 10 MG tablet Take 1 tablet (10 mg total) by mouth daily.  90 tablet  1  . glimepiride (AMARYL) 4 MG tablet Take 4 mg by mouth 1 day or 1 dose.  90 tablet  1  . glucose blood (ONETOUCH VERIO IQ) test strip Use as instructed bid  300 each  3  . lovastatin (MEVACOR) 20 MG tablet TAKE ONE TABLET BY MOUTH EVERY DAY  90 tablet  1  . meclizine (ANTIVERT) 12.5 MG tablet Take 1-2 tablets (12.5-25 mg total) by mouth 3 (three) times daily as needed.  60 tablet  1  . metFORMIN (GLUCOPHAGE) 1000 MG tablet TAKE ONE TABLET BY MOUTH TWICE DAILY  180 tablet  1  . nitroGLYCERIN (NITROSTAT) 0.4 MG SL tablet Place 0.4 mg under the tongue every 5 (five) minutes as needed.      . pantoprazole (PROTONIX) 40 MG tablet Take 1 tablet (40 mg total) by mouth daily.  90 tablet  3  . traMADol (ULTRAM) 50 MG tablet Take 1-2 tablets (50-100 mg total) by mouth 2 (two) times daily as needed. For pain.  60 tablet  5     Past Medical History  Diagnosis Date  . Benign prostatic hypertrophy   . Arthritis   . Benign positional vertigo   . Chronic ischemic heart disease, unspecified   . Family history of ischemic heart disease   . Hyperlipidemia   . Esophageal reflux   . CAD (coronary artery disease)   . Observation for  suspected malignant neoplasm   . Routine general medical examination at a health care facility   . Balanitis   . HTN (hypertension)   . ED (erectile dysfunction)   . Type II or unspecified type diabetes mellitus without mention of complication, not stated as uncontrolled 01-13-11    oral meds only    ROS:   All systems reviewed and negative except as noted in the HPI.   Past Surgical History  Procedure Date  . Penile prosthesis implant 01-13-11    02-08-10  . 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 Cataract extraction 11-812    right eye only  . Total hip arthroplasty 01/24/2011    Procedure: TOTAL HIP ARTHROPLASTY;  Surgeon: Johnny [name];  Location: [facility];  Service: Orthopedics;  Laterality: Left;  . Joint replacement     12/12 L THR     Family History  Problem Relation Age of Onset  . Coronary artery disease Other     1st degree male relative  . Hypertension Other   . Hypertension Mother   . Diabetes Father      History   Social History  . Marital Status: Married    Spouse Name: N/A    Number of Children: N/A  .  Years of Education: N/A   Occupational History  . Preacher    Social History Main Topics  . Smoking status: Former Games developer  . Smokeless tobacco: Not on file  . Alcohol Use: No  . Drug Use: No  . Sexually Active: Yes   Other Topics Concern  . Not on file   Social History Narrative   Regular Exercise-No     BP 124/73  Pulse 73  Ht 5\' 11"  (1.803 m)  Wt 249 lb 12.8 oz (113.309 kg)  BMI 34.84 kg/m2  Physical Exam:  Well appearing middle-aged man, NAD HEENT: Unremarkable Neck:  7 cm JVD, no thyromegally Lungs:  Clear with no wheezes, rales, or rhonchi. HEART:  Regular rate rhythm, no murmurs, no rubs, no clicks Abd:  soft, positive bowel sounds, no organomegally, no rebound, no guarding Ext:  2 plus pulses, no edema, no cyanosis, no clubbing Skin:  No rashes no nodules Neuro:  CN II through XII intact,  motor grossly intact  EKG Normal sinus rhythm with nonspecific ST-T wave abnormality.  Assess/Plan:

## 2012-03-22 NOTE — Patient Instructions (Signed)
Your physician wants you to follow-up in: 12 months with Dr. Taylor. You will receive a reminder letter in the mail two months in advance. If you don't receive a letter, please call our office to schedule the follow-up appointment.    

## 2012-03-22 NOTE — Assessment & Plan Note (Signed)
Today his blood pressure is well controlled. He will continue his current medical therapy.

## 2012-04-18 IMAGING — CR DG CHEST 2V
2 series · 2 of 2 positions shown · non-contrast
Comparison: 06/29/2006

CLINICAL DATA: Preoperative respiratory exam.  Severe
osteoarthritis of the left hip.

CHEST - 2 VIEW

[w chest pa]
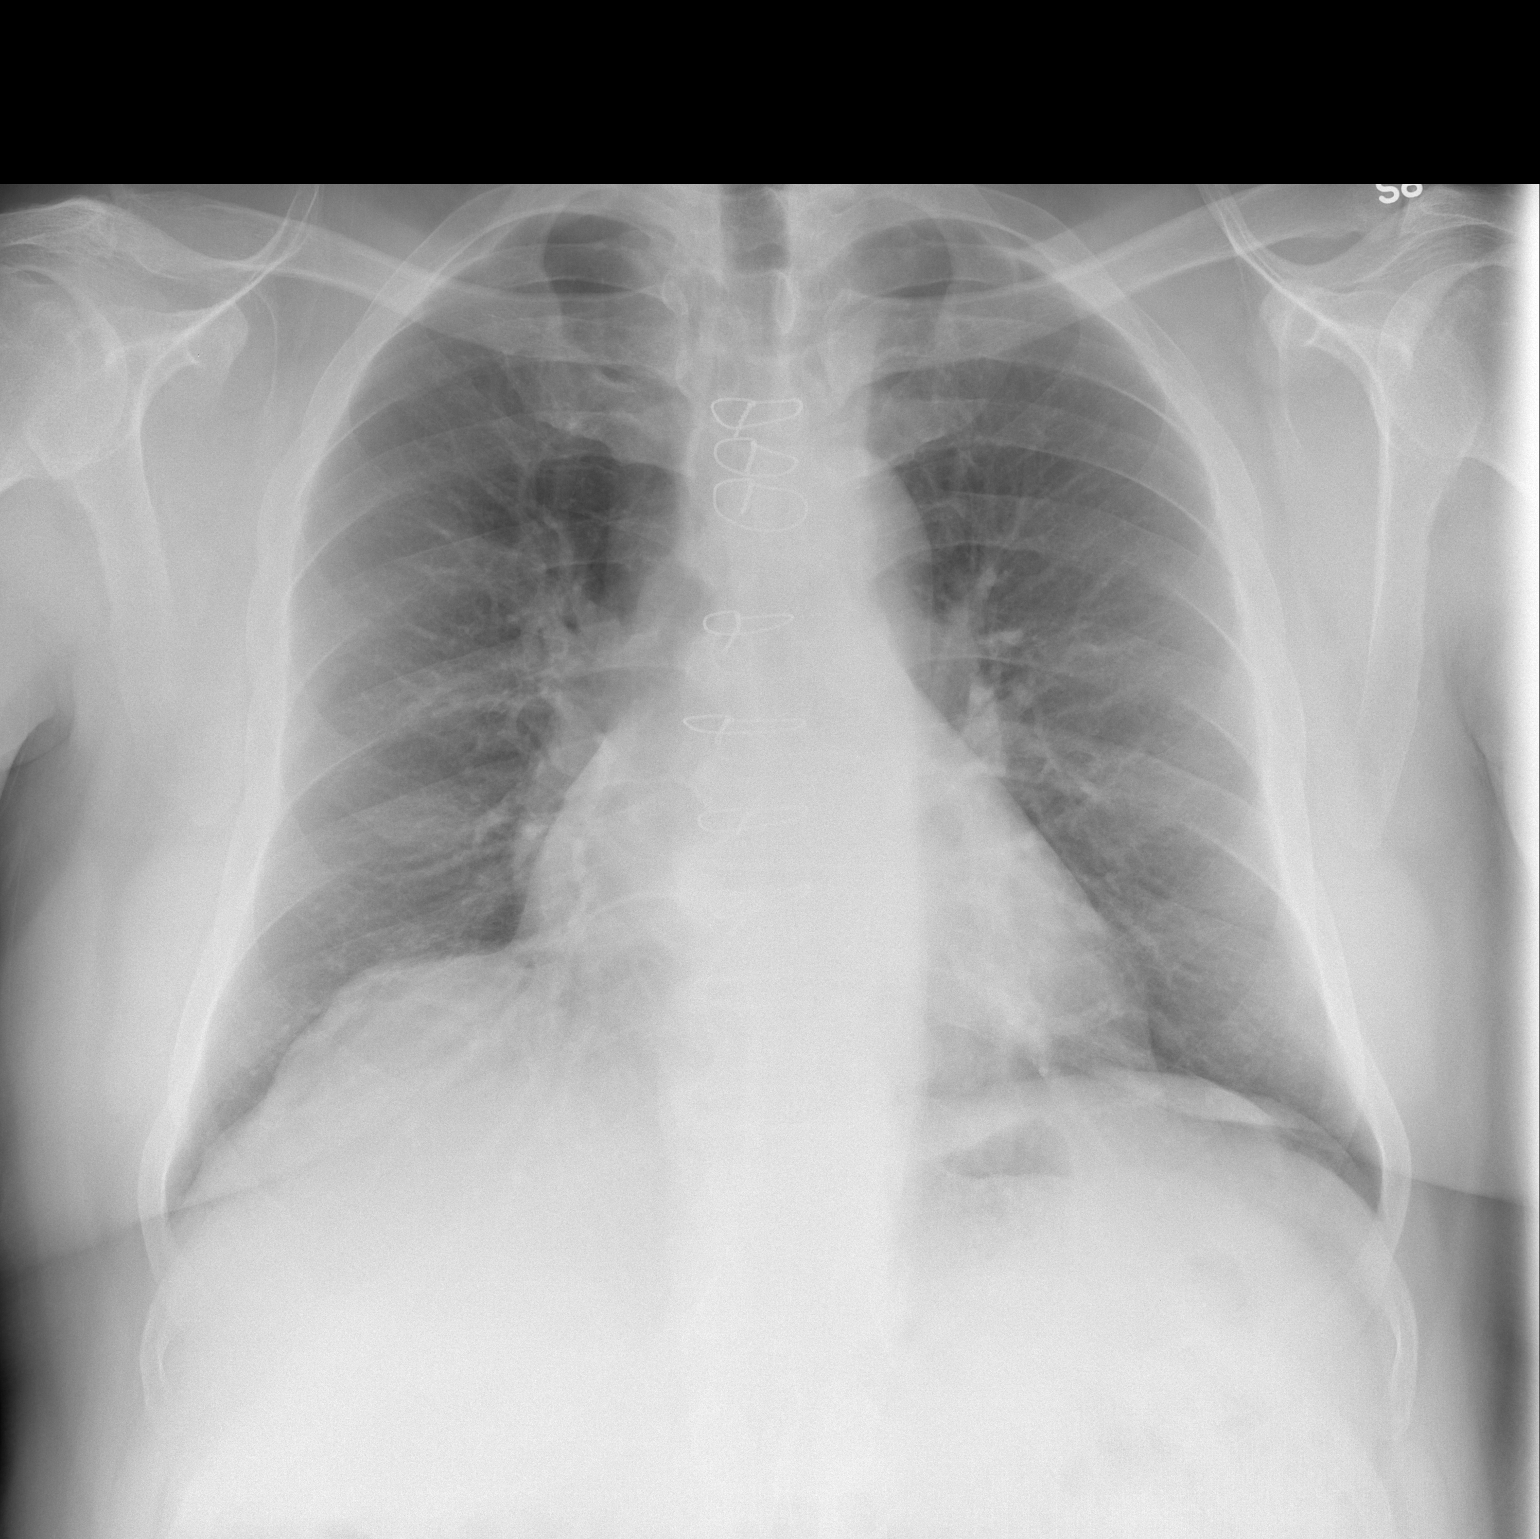

[w chest lat *]
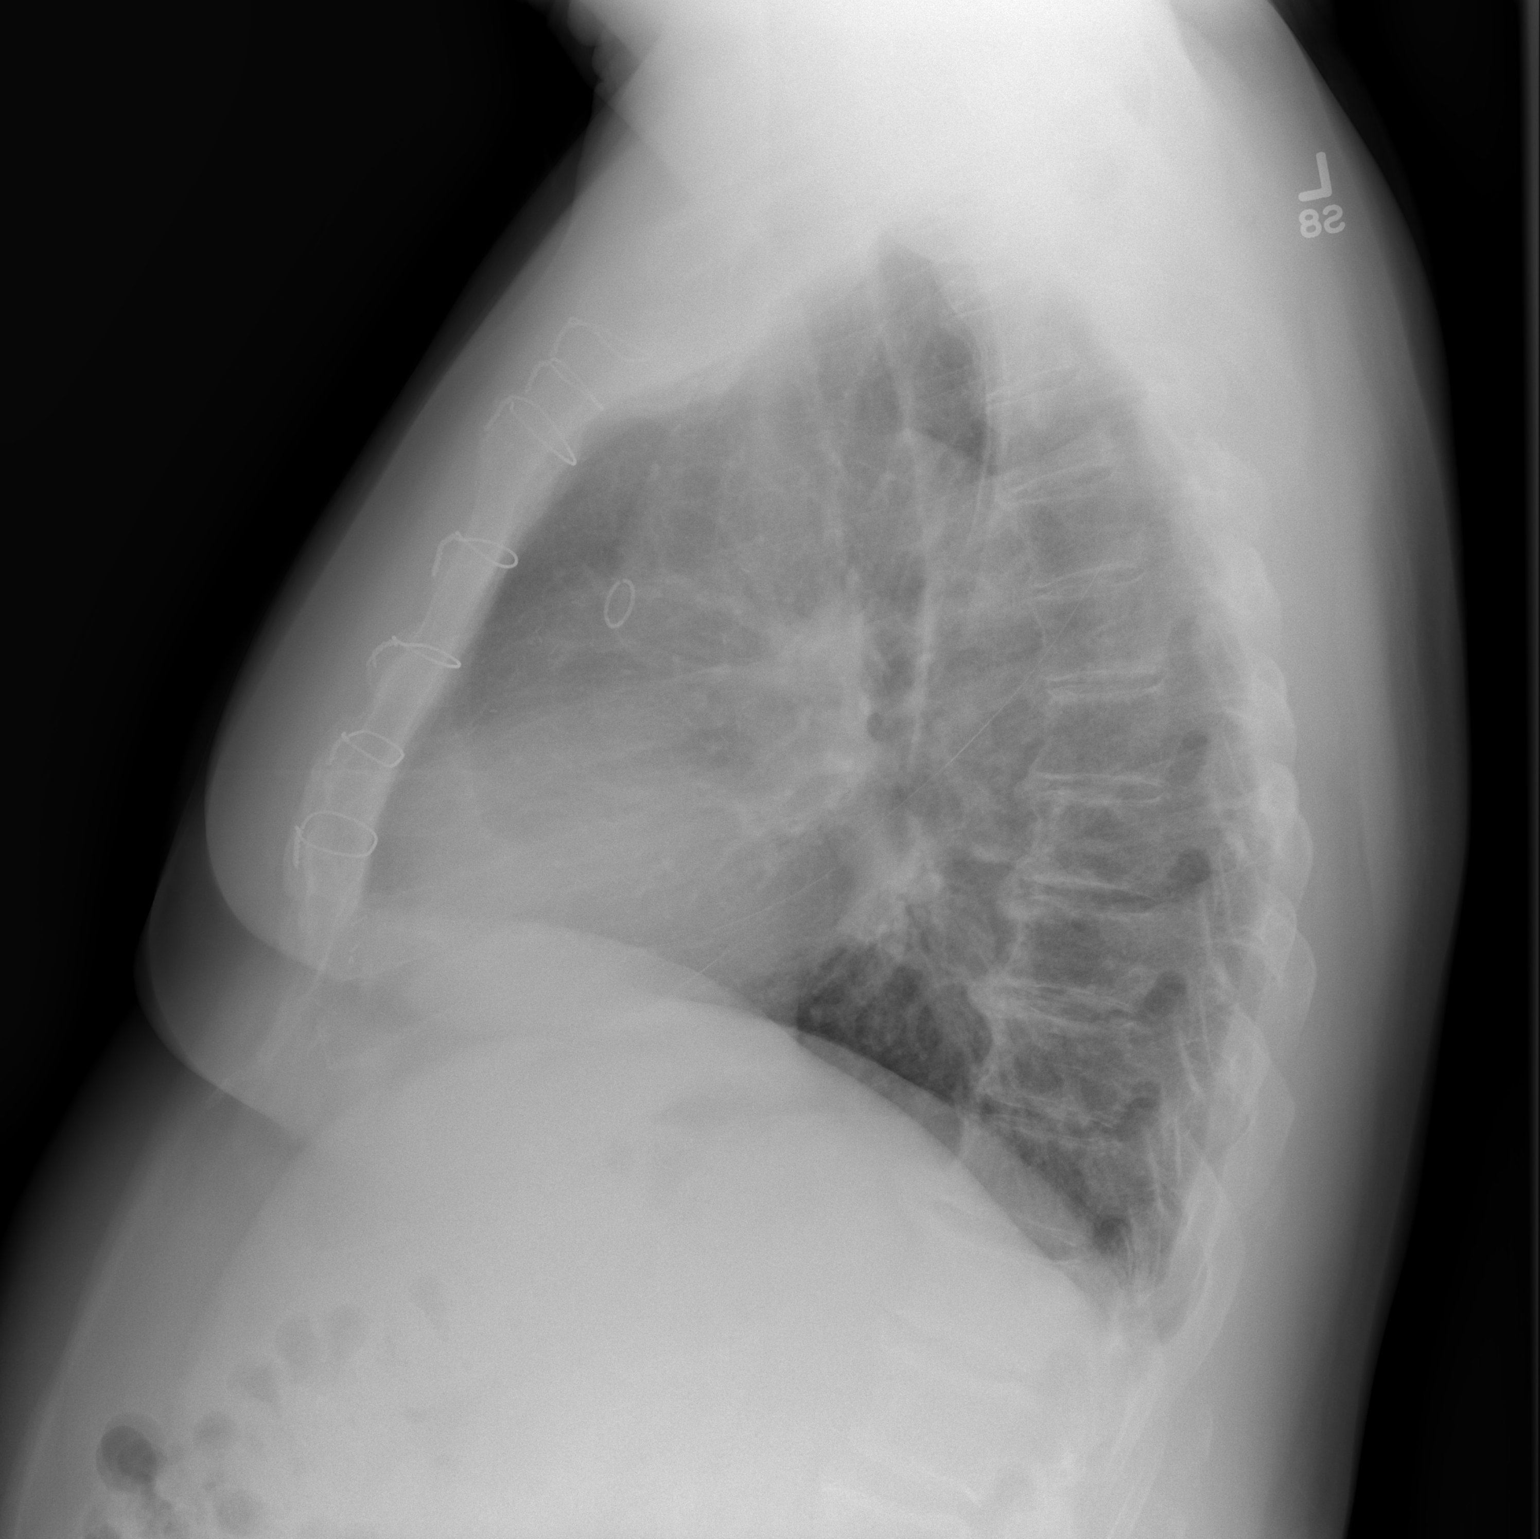

[2 of 2 positions shown; findings below may reference images not displayed]

FINDINGS: The heart size and pulmonary vascularity are normal and
the lungs are clear.  Evidence of prior CABG.  No acute osseous
abnormality.
IMPRESSION: No acute abnormalities.

## 2012-05-15 ENCOUNTER — Other Ambulatory Visit: Payer: Self-pay | Admitting: *Deleted

## 2012-05-15 MED ORDER — PANTOPRAZOLE SODIUM 40 MG PO TBEC: 40.0000 mg | DELAYED_RELEASE_TABLET | Freq: Every day | ORAL | Status: DC

## 2012-06-15 ENCOUNTER — Encounter: Payer: Self-pay | Admitting: Internal Medicine

## 2012-06-15 ENCOUNTER — Ambulatory Visit (INDEPENDENT_AMBULATORY_CARE_PROVIDER_SITE_OTHER): Payer: Medicare Other | Admitting: Internal Medicine

## 2012-06-15 ENCOUNTER — Other Ambulatory Visit (INDEPENDENT_AMBULATORY_CARE_PROVIDER_SITE_OTHER): Payer: Medicare Other

## 2012-06-15 VITALS — BP 130/58 | HR 57 | Temp 97.9°F | Wt 245.8 lb

## 2012-06-15 DIAGNOSIS — E119 Type 2 diabetes mellitus without complications: Secondary | ICD-10-CM

## 2012-06-15 DIAGNOSIS — I259 Chronic ischemic heart disease, unspecified: Secondary | ICD-10-CM

## 2012-06-15 DIAGNOSIS — R42 Dizziness and giddiness: Secondary | ICD-10-CM

## 2012-06-15 DIAGNOSIS — I10 Essential (primary) hypertension: Secondary | ICD-10-CM

## 2012-06-15 DIAGNOSIS — E785 Hyperlipidemia, unspecified: Secondary | ICD-10-CM

## 2012-06-15 DIAGNOSIS — I2510 Atherosclerotic heart disease of native coronary artery without angina pectoris: Secondary | ICD-10-CM

## 2012-06-15 LAB — BASIC METABOLIC PANEL
CO2: 25 mEq/L (ref 19–32)
Glucose, Bld: 127 mg/dL — ABNORMAL HIGH (ref 70–99)
Potassium: 4.2 mEq/L (ref 3.5–5.1)
Sodium: 134 mEq/L — ABNORMAL LOW (ref 135–145)

## 2012-06-15 NOTE — Assessment & Plan Note (Signed)
No angina Continue with current prescription therapy as reflected on the Med list.  

## 2012-06-15 NOTE — Assessment & Plan Note (Signed)
Continue with current prescription therapy as reflected on the Med list.  

## 2012-06-15 NOTE — Assessment & Plan Note (Signed)
Resolved

## 2012-06-15 NOTE — Progress Notes (Signed)
   Subjective:    Neck Pain  This is a chronic problem. The current episode started more than 1 year ago. The problem has been waxing and waning. The pain is at a severity of 5/10. The pain is moderate. Pertinent negatives include no chest pain, trouble swallowing or weakness.    The patient presents for a follow-up of  chronic hypertension, chronic dyslipidemia, type 2 diabetes controlled with medicines.    Wt Readings from Last 3 Encounters:  06/15/12 245 lb 12.8 oz (111.494 kg)  03/22/12 249 lb 12.8 oz (113.309 kg)  03/20/12 249 lb (112.946 kg)   BP Readings from Last 3 Encounters:  06/15/12 130/58  03/22/12 124/73  03/20/12 120/62      Review of Systems  Constitutional: Negative for appetite change, fatigue and unexpected weight change.  HENT: Positive for neck pain. Negative for nosebleeds, congestion, sore throat, sneezing and trouble swallowing.   Eyes: Negative for itching and visual disturbance.  Respiratory: Negative for cough and shortness of breath.   Cardiovascular: Negative for chest pain, palpitations and leg swelling.  Gastrointestinal: Negative for nausea, diarrhea, blood in stool and abdominal distention.  Genitourinary: Negative for frequency and hematuria.  Musculoskeletal: Negative for back pain, joint swelling and gait problem.  Skin: Negative for rash.  Neurological: Negative for dizziness, tremors, speech difficulty and weakness.  Psychiatric/Behavioral: Negative for sleep disturbance, dysphoric mood and agitation. The patient is not nervous/anxious.        Objective:   Physical Exam  Constitutional: He is oriented to person, place, and time. He appears well-developed.  Obese   HENT:  Mouth/Throat: Oropharynx is clear and moist.  Eyes: Conjunctivae are normal. Pupils are equal, round, and reactive to light.  Neck: Normal range of motion. No JVD present. No thyromegaly present.  Cardiovascular: Normal rate, regular rhythm, normal heart sounds and  intact distal pulses.  Exam reveals no gallop and no friction rub.   No murmur heard. Pulmonary/Chest: Effort normal and breath sounds normal. No respiratory distress. He has no wheezes. He has no rales. He exhibits no tenderness.  Abdominal: Soft. Bowel sounds are normal. He exhibits no distension and no mass. There is no tenderness. There is no rebound and no guarding.  Musculoskeletal: Normal range of motion. He exhibits no edema and no tenderness.  Lymphadenopathy:    He has no cervical adenopathy.  Neurological: He is alert and oriented to person, place, and time. He has normal reflexes. No cranial nerve deficit. He exhibits normal muscle tone. Coordination normal.  Skin: Skin is warm and dry. No rash noted.  Psychiatric: He has a normal mood and affect. His behavior is normal. Judgment and thought content normal.    Lab Results  Component Value Date   WBC 5.9 07/14/2011   HGB 11.8* 07/14/2011   HCT 35.8* 07/14/2011   PLT 247.0 07/14/2011   GLUCOSE 94 03/20/2012   CHOL 136 07/14/2011   TRIG 70.0 07/14/2011   HDL 32.50* 07/14/2011   LDLDIRECT 78.1 09/16/2008   LDLCALC 90 07/14/2011   ALT 13 07/14/2011   AST 16 07/14/2011   NA 137 03/20/2012   K 3.9 03/20/2012   CL 104 03/20/2012   CREATININE 1.0 03/20/2012   BUN 11 03/20/2012   CO2 27 03/20/2012   TSH 3.59 10/14/2010   PSA 1.28 10/24/2008   INR 1.19 01/13/2011   HGBA1C 7.5* 03/20/2012        Assessment & Plan:

## 2012-06-22 ENCOUNTER — Telehealth: Payer: Self-pay | Admitting: Internal Medicine

## 2012-06-22 MED ORDER — DOXAZOSIN MESYLATE 8 MG PO TABS: 8.0000 mg | ORAL_TABLET | Freq: Every day | ORAL | Status: DC

## 2012-06-22 MED ORDER — LOVASTATIN 20 MG PO TABS: ORAL_TABLET | ORAL | Status: DC

## 2012-06-22 MED ORDER — ENALAPRIL MALEATE 10 MG PO TABS: 10.0000 mg | ORAL_TABLET | Freq: Every day | ORAL | Status: DC

## 2012-06-22 MED ORDER — GLIMEPIRIDE 4 MG PO TABS: ORAL_TABLET | ORAL | Status: DC

## 2012-06-22 MED ORDER — METFORMIN HCL 1000 MG PO TABS: ORAL_TABLET | ORAL | Status: DC

## 2012-06-22 MED ORDER — PANTOPRAZOLE SODIUM 40 MG PO TBEC: 40.0000 mg | DELAYED_RELEASE_TABLET | Freq: Every day | ORAL | Status: DC

## 2012-06-22 NOTE — Telephone Encounter (Signed)
All Rx's refilled to [facility]. Pt advised of same

## 2012-06-22 NOTE — Telephone Encounter (Signed)
WIfe, Marylene Land calling stating she needs Dr. Loren Racer assisstant to update ALL of pt's meds and please send to [facility] on file. Pt doesn't have much Metformin left and needs asap. States no more refills on pt's Rx.  PLEASE F/U WITH WIFE TODAY.

## 2012-06-25 ENCOUNTER — Encounter: Payer: Self-pay | Admitting: *Deleted

## 2012-10-12 ENCOUNTER — Telehealth: Payer: Self-pay | Admitting: *Deleted

## 2012-10-12 NOTE — Telephone Encounter (Signed)
OK Rx is on the counter Thx

## 2012-10-12 NOTE — Telephone Encounter (Signed)
Angie, pts wife called requesting an Rx be sent to [facility] for a EA-ERECAID battery vacuum.  States pt would rather try this method instead of undergoing surgery again.  The phone number is [phone] and fax number is [phone].  Please advise

## 2012-10-15 NOTE — Telephone Encounter (Signed)
Rx faxed as per request.

## 2012-10-19 ENCOUNTER — Encounter: Payer: Self-pay | Admitting: Internal Medicine

## 2012-10-19 ENCOUNTER — Other Ambulatory Visit (INDEPENDENT_AMBULATORY_CARE_PROVIDER_SITE_OTHER): Payer: Medicare Other

## 2012-10-19 ENCOUNTER — Ambulatory Visit (INDEPENDENT_AMBULATORY_CARE_PROVIDER_SITE_OTHER): Payer: Medicare Other | Admitting: Internal Medicine

## 2012-10-19 VITALS — BP 130/64 | HR 66 | Temp 98.1°F | Wt 250.0 lb

## 2012-10-19 DIAGNOSIS — E119 Type 2 diabetes mellitus without complications: Secondary | ICD-10-CM

## 2012-10-19 DIAGNOSIS — M1611 Unilateral primary osteoarthritis, right hip: Secondary | ICD-10-CM

## 2012-10-19 DIAGNOSIS — Z951 Presence of aortocoronary bypass graft: Secondary | ICD-10-CM

## 2012-10-19 DIAGNOSIS — M169 Osteoarthritis of hip, unspecified: Secondary | ICD-10-CM

## 2012-10-19 DIAGNOSIS — F528 Other sexual dysfunction not due to a substance or known physiological condition: Secondary | ICD-10-CM

## 2012-10-19 DIAGNOSIS — E785 Hyperlipidemia, unspecified: Secondary | ICD-10-CM

## 2012-10-19 DIAGNOSIS — I10 Essential (primary) hypertension: Secondary | ICD-10-CM

## 2012-10-19 LAB — BASIC METABOLIC PANEL
CO2: 28 mEq/L (ref 19–32)
Calcium: 8.9 mg/dL (ref 8.4–10.5)
Glucose, Bld: 191 mg/dL — ABNORMAL HIGH (ref 70–99)
Potassium: 4 mEq/L (ref 3.5–5.1)
Sodium: 137 mEq/L (ref 135–145)

## 2012-10-19 MED ORDER — TRAMADOL HCL 50 MG PO TABS: 50.0000 mg | ORAL_TABLET | Freq: Two times a day (BID) | ORAL | Status: DC | PRN

## 2012-10-19 NOTE — Assessment & Plan Note (Signed)
Continue with current prescription therapy as reflected on the Med list.  

## 2012-10-19 NOTE — Assessment & Plan Note (Signed)
F/u w/Urol 

## 2012-10-19 NOTE — Progress Notes (Signed)
   Subjective:    HPI  The patient presents for a follow-up of  chronic hypertension, chronic dyslipidemia, type 2 diabetes controlled with medicines. C/o ED, penile pump leaked Needs R THR    Wt Readings from Last 3 Encounters:  10/19/12 250 lb (113.399 kg)  06/15/12 245 lb 12.8 oz (111.494 kg)  03/22/12 249 lb 12.8 oz (113.309 kg)   BP Readings from Last 3 Encounters:  10/19/12 130/64  06/15/12 130/58  03/22/12 124/73      Review of Systems  Constitutional: Negative for appetite change, fatigue and unexpected weight change.  HENT: Negative for nosebleeds, congestion, sore throat and sneezing.   Eyes: Negative for itching and visual disturbance.  Respiratory: Negative for cough and shortness of breath.   Cardiovascular: Negative for palpitations and leg swelling.  Gastrointestinal: Negative for nausea, diarrhea, blood in stool and abdominal distention.  Genitourinary: Negative for frequency and hematuria.  Musculoskeletal: Negative for back pain, joint swelling and gait problem.  Skin: Negative for rash.  Neurological: Negative for dizziness, tremors and speech difficulty.  Psychiatric/Behavioral: Negative for sleep disturbance, dysphoric mood and agitation. The patient is not nervous/anxious.        Objective:   Physical Exam  Constitutional: He is oriented to person, place, and time. He appears well-developed.  Obese   HENT:  Mouth/Throat: Oropharynx is clear and moist.  Eyes: Conjunctivae are normal. Pupils are equal, round, and reactive to light.  Neck: Normal range of motion. No JVD present. No thyromegaly present.  Cardiovascular: Normal rate, regular rhythm, normal heart sounds and intact distal pulses.  Exam reveals no gallop and no friction rub.   No murmur heard. Pulmonary/Chest: Effort normal and breath sounds normal. No respiratory distress. He has no wheezes. He has no rales. He exhibits no tenderness.  Abdominal: Soft. Bowel sounds are normal. He  exhibits no distension and no mass. There is no tenderness. There is no rebound and no guarding.  Musculoskeletal: Normal range of motion. He exhibits no edema and no tenderness.  Lymphadenopathy:    He has no cervical adenopathy.  Neurological: He is alert and oriented to person, place, and time. He has normal reflexes. No cranial nerve deficit. He exhibits normal muscle tone. Coordination normal.  Skin: Skin is warm and dry. No rash noted.  Psychiatric: He has a normal mood and affect. His behavior is normal. Judgment and thought content normal.   Cane  Lab Results  Component Value Date   WBC 5.9 07/14/2011   HGB 11.8* 07/14/2011   HCT 35.8* 07/14/2011   PLT 247.0 07/14/2011   GLUCOSE 127* 06/15/2012   CHOL 136 07/14/2011   TRIG 70.0 07/14/2011   HDL 32.50* 07/14/2011   LDLDIRECT 78.1 09/16/2008   LDLCALC 90 07/14/2011   ALT 13 07/14/2011   AST 16 07/14/2011   NA 134* 06/15/2012   K 4.2 06/15/2012   CL 101 06/15/2012   CREATININE 1.0 06/15/2012   BUN 14 06/15/2012   CO2 25 06/15/2012   TSH 3.59 10/14/2010   PSA 1.28 10/24/2008   INR 1.19 01/13/2011   HGBA1C 6.8* 06/15/2012        Assessment & Plan:

## 2012-10-19 NOTE — Assessment & Plan Note (Signed)
End stage Dr Lequita Halt THR is planned

## 2012-10-19 NOTE — Assessment & Plan Note (Signed)
S/p surgery THR  Dr Lequita Halt

## 2012-10-21 ENCOUNTER — Encounter: Payer: Self-pay | Admitting: Internal Medicine

## 2012-10-21 NOTE — Assessment & Plan Note (Signed)
Continue with current prescription therapy as reflected on the Med list.  

## 2012-10-23 ENCOUNTER — Telehealth: Payer: Self-pay | Admitting: *Deleted

## 2012-10-23 NOTE — Telephone Encounter (Signed)
Spoke with pt, advised of MDs result note.

## 2012-10-25 ENCOUNTER — Telehealth: Payer: Self-pay | Admitting: *Deleted

## 2012-10-25 NOTE — Telephone Encounter (Signed)
Christine from [facility] called requesting office notes that documents pts Erectile Dysfunction.  Requests notes be faxed to  [phone] attn:  Christine.  Please advise

## 2012-10-25 NOTE — Telephone Encounter (Signed)
pls find and fax Thx

## 2012-10-26 ENCOUNTER — Telehealth: Payer: Self-pay | Admitting: *Deleted

## 2012-10-26 NOTE — Telephone Encounter (Signed)
Caller requesting clinical info for pt to have penile pump replacement approval. Faxing 10/19/12 OV note to her at [phone].

## 2012-10-31 NOTE — Telephone Encounter (Signed)
Spoke with Johnny [name] she advised me her company is out of network and can not assist any further in this matter.

## 2012-12-06 ENCOUNTER — Telehealth: Payer: Self-pay | Admitting: *Deleted

## 2012-12-06 NOTE — Telephone Encounter (Signed)
Angie, pts wife called states [facility] should have received a fax from [employer] in reference to pts Male Enhancement Pump.  I called [employer] because I saw no record of receipt.  Rep at [facility] states they had not sent it to [facility] yet but would be faxing it today.  [phone] phone; [phone] fax.

## 2012-12-12 NOTE — Telephone Encounter (Signed)
I haven't seen this form Thx

## 2013-01-17 ENCOUNTER — Encounter (HOSPITAL_COMMUNITY): Payer: Self-pay | Admitting: Pharmacy Technician

## 2013-01-17 ENCOUNTER — Other Ambulatory Visit: Payer: Self-pay | Admitting: Orthopedic Surgery

## 2013-01-17 NOTE — Progress Notes (Signed)
Preoperative surgical orders have been place into the Epic hospital system for Johnny [name] on 01/17/2013, 10:56 AM  by Patrica Duel for surgery on 01/30/2013.  Preop Total Hip orders including Experel Injecion, PO Tylenol, and IV Decadron as long as there are no contraindications to the above medications. Avel Peace, PA-C

## 2013-01-21 ENCOUNTER — Ambulatory Visit (HOSPITAL_COMMUNITY)
Admission: RE | Admit: 2013-01-21 | Discharge: 2013-01-21 | Disposition: A | Discharge status: Home / Self Care | Payer: Medicare Other | Source: Ambulatory Visit | Attending: Orthopedic Surgery | Admitting: Orthopedic Surgery

## 2013-01-21 ENCOUNTER — Encounter (HOSPITAL_COMMUNITY)
Admission: RE | Admit: 2013-01-21 | Discharge: 2013-01-21 | Disposition: A | Discharge status: Home / Self Care | Payer: Medicare Other | Source: Ambulatory Visit | Attending: Interventional Radiology | Admitting: Interventional Radiology

## 2013-01-21 ENCOUNTER — Ambulatory Visit (HOSPITAL_COMMUNITY): Admission: RE | Admit: 2013-01-21 | Payer: Medicare Other | Source: Ambulatory Visit

## 2013-01-21 ENCOUNTER — Encounter (HOSPITAL_COMMUNITY): Payer: Self-pay

## 2013-01-21 ENCOUNTER — Other Ambulatory Visit: Payer: Self-pay

## 2013-01-21 DIAGNOSIS — Z01810 Encounter for preprocedural cardiovascular examination: Secondary | ICD-10-CM | POA: Insufficient documentation

## 2013-01-21 DIAGNOSIS — M1610 Unilateral primary osteoarthritis, unspecified hip: Secondary | ICD-10-CM | POA: Insufficient documentation

## 2013-01-21 DIAGNOSIS — IMO0002 Reserved for concepts with insufficient information to code with codable children: Secondary | ICD-10-CM | POA: Insufficient documentation

## 2013-01-21 DIAGNOSIS — Z01818 Encounter for other preprocedural examination: Secondary | ICD-10-CM | POA: Insufficient documentation

## 2013-01-21 DIAGNOSIS — Z01812 Encounter for preprocedural laboratory examination: Secondary | ICD-10-CM | POA: Insufficient documentation

## 2013-01-21 DIAGNOSIS — M169 Osteoarthritis of hip, unspecified: Secondary | ICD-10-CM | POA: Insufficient documentation

## 2013-01-21 LAB — COMPREHENSIVE METABOLIC PANEL
ALT: 13 U/L (ref 0–53)
Albumin: 3.7 g/dL (ref 3.5–5.2)
Alkaline Phosphatase: 78 U/L (ref 39–117)
CO2: 26 mEq/L (ref 19–32)
GFR calc Af Amer: >90 mL/min (ref >90)
Potassium: 3.9 mEq/L (ref 3.5–5.1)
Sodium: 138 mEq/L (ref 135–145)
Total Protein: 7.2 g/dL (ref 6.0–8.3)

## 2013-01-21 LAB — SURGICAL PCR SCREEN
MRSA, PCR: NEGATIVE
Staphylococcus aureus: NEGATIVE

## 2013-01-21 LAB — CBC
HCT: 35.6 % — ABNORMAL LOW (ref 39.0–52.0)
MCHC: 32.6 g/dL (ref 30.0–36.0)
RBC: 4.26 MIL/uL (ref 4.22–5.81)
RDW: 15.1 % (ref 11.5–15.5)
WBC: 6.4 10*3/uL (ref 4.0–10.5)

## 2013-01-21 LAB — URINALYSIS, ROUTINE W REFLEX MICROSCOPIC
Glucose, UA: NEGATIVE mg/dL
Hgb urine dipstick: NEGATIVE
Ketones, ur: NEGATIVE mg/dL
Leukocytes, UA: NEGATIVE
Nitrite: NEGATIVE
Protein, ur: NEGATIVE mg/dL
Specific Gravity, Urine: 1.012 (ref 1.005–1.030)
Urobilinogen, UA: 1 mg/dL (ref 0.0–1.0)

## 2013-01-21 LAB — APTT: aPTT: 33 seconds (ref 24–37)

## 2013-01-21 LAB — PROTIME-INR: INR: 1.15 (ref 0.00–1.49)

## 2013-01-21 NOTE — Pre-Procedure Instructions (Signed)
01-21-13 EKG/ CXR/ RHip xray done today

## 2013-01-21 NOTE — Patient Instructions (Addendum)
[address] [place]  01/21/2013   Your procedure is scheduled on:  11-26 -2014  Report to [facility] at    0915    AM .  Call this number if you have problems the morning of surgery: [phone]  Or Presurgical Testing [phone](Rudransh Bellanca)     Do not eat food:After Midnight.  May have Clear Liquids 12 midnight to 0600 AM:  Black coffee, tea, sodas, apple juice, clear broth, grape juice, cranberry juice.  Take these medicines the morning of surgery with A SIP OF WATER: none. Do not take any Diabetic meds.   Do not wear jewelry, make-up or nail polish.  Do not wear lotions, powders, or perfumes. You may wear deodorant.  Do not shave 12 hours prior to first CHG shower(legs and under arms).(face and neck okay.)  Do not bring valuables to the hospital.  Contacts, dentures or removable bridgework, body piercing, hair pins may not be worn into surgery.  Leave suitcase in the car. After surgery it may be brought to your room.  For patients admitted to the hospital, checkout time is 11:00 AM the day of discharge.   Patients discharged the day of surgery will not be allowed to drive home. Must have responsible person with you x 24 hours once discharged.  Name and phone number of your driver: Angeline-spouse [phone] cell  Special Instructions: CHG(Chlorhedine 4%-"Hibiclens","Betasept","Aplicare") Shower Use Special Wash: see special instructions.(avoid face and genitals)   Please read over the following fact sheets that you were given: MRSA Information, Blood Transfusion fact sheet, Incentive Spirometry Instruction.  Remember : Type/Screen "Blue armbands" - may not be removed once applied(would result in being retested if removed).  Failure to follow these instructions may result in Cancellation of your surgery.   Patient signature_______________________________________________________

## 2013-01-23 NOTE — H&P (Signed)
TOTAL HIP ADMISSION H&P  Patient is admitted for right total hip arthroplasty.  Subjective:  Chief Complaint: right hip pain  HPI: Darla Lesches, 67 y.o. male, has a history of pain and functional disability in the right hip(s) due to arthritis and patient has failed non-surgical conservative treatments for greater than 12 weeks to include NSAID's and/or analgesics, use of assistive devices and activity modification.  Onset of symptoms was gradual starting 2 years ago with gradually worsening course since that time.The patient noted no past surgery on the right hip(s).  Patient currently rates pain in the right hip at 6 out of 10 with activity. Patient has night pain, worsening of pain with activity and weight bearing, pain that interfers with activities of daily living and pain with passive range of motion. Patient has evidence of periarticular osteophytes and joint space narrowing by imaging studies. This condition presents safety issues increasing the risk of falls. There is no current active infection.  Patient Active Problem List   Diagnosis Date Noted  . Osteoarthritis of right hip 10/19/2012  . Vertigo 02/08/2012  . Neck abscess 08/19/2011  . Primary osteoarthritis of left hip 01/26/2011  . Preop exam for internal medicine 10/14/2010  . WART, VIRAL 10/30/2009  . COSTOCHONDRITIS 08/10/2009  . CHEST PAIN 08/10/2009  . BRONCHITIS, ACUTE 04/23/2009  . BALANITIS 04/23/2009  . PRURITUS 12/23/2008  . TOBACCO USE, QUIT 12/23/2008  . BLADDER OUTLET OBSTRUCTION 10/24/2008  . DEGENERATIVE JOINT DISEASE, LEFT HIP 10/08/2007  . HIP PAIN 08/28/2007  . ERECTILE DYSFUNCTION 08/07/2007  . ISCHEMIC HEART DISEASE 08/07/2007  . ARTHRITIS 08/07/2007  . BENIGN PROSTATIC HYPERTROPHY, HX OF 08/07/2007  . CORONARY ARTERY BYPASS GRAFT, THREE VESSEL, HX OF 08/07/2007  . DIABETES MELLITUS, TYPE II 03/02/2007  . HYPERLIPIDEMIA 03/02/2007  . HYPERTENSION 03/02/2007  . CORONARY ARTERY DISEASE 03/02/2007   . GERD 03/02/2007   Past Medical History  Diagnosis Date  . Benign prostatic hypertrophy   . Arthritis   . Benign positional vertigo   . Chronic ischemic heart disease, unspecified   . Family history of ischemic heart disease   . Hyperlipidemia   . Esophageal reflux   . CAD (coronary artery disease)   . Observation for suspected malignant neoplasm   . Routine general medical examination at a health care facility   . Balanitis   . HTN (hypertension)   . ED (erectile dysfunction)   . Type II or unspecified type diabetes mellitus without mention of complication, not stated as uncontrolled 01-13-11    oral meds only    Past Surgical History  Procedure Laterality Date  . Penile prosthesis implant  01-13-11    02-08-10  . 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 Cataract extraction  01-13-11    right eye only  . Total hip arthroplasty  01/24/2011    Procedure: TOTAL HIP ARTHROPLASTY;  Surgeon: Gus Rankin [name];  Location: [facility];  Service: Orthopedics;  Laterality: Left;  . Joint replacement      12/12 L THR     Current outpatient prescriptions: aspirin 325 MG tablet, Take 325 mg by mouth daily., Disp: , Rfl: ;   doxazosin (CARDURA) 8 MG tablet, Take 8 mg by mouth at bedtime., Disp: , Rfl: ;   enalapril (VASOTEC) 10 MG tablet, Take 10 mg by mouth daily with breakfast., Disp: , Rfl: ;   glimepiride (AMARYL) 4 MG tablet, Take 4 mg by mouth daily., Disp: , Rfl: ;  glucose blood (ONETOUCH VERIO IQ) test strip, Use as instructed bid, Disp: 300 each, Rfl: 3 lovastatin (MEVACOR) 20 MG tablet, Take 20 mg by mouth daily., Disp: , Rfl: ;   meclizine (ANTIVERT) 12.5 MG tablet, Take 12.5-25 mg by mouth 3 (three) times daily as needed for dizziness or nausea., Disp: , Rfl: ;   metFORMIN (GLUCOPHAGE) 1000 MG tablet, Take 1,000 mg by mouth 2 (two) times daily with a meal., Disp: , Rfl:  nitroGLYCERIN (NITROSTAT) 0.4 MG SL tablet, Place 0.4 mg under the tongue every 5  (five) minutes as needed for chest pain. , Disp: , Rfl: ;   traMADol (ULTRAM) 50 MG tablet, Take 50-100 mg by mouth 2 (two) times daily as needed (pain). For pain., Disp: , Rfl: ;   pantoprazole (PROTONIX) 40 MG tablet, Take 40 mg by mouth daily., Disp: , Rfl:   Allergies  Allergen Reactions  . Hydrocodone-Acetaminophen     REACTION: nausea    History  Substance Use Topics  . Smoking status: Former Games developer  . Smokeless tobacco: Not on file  . Alcohol Use: No    Family History  Problem Relation Age of Onset  . Coronary artery disease Other     1st degree male relative  . Hypertension Other   . Hypertension Mother   . Diabetes Father      Review of Systems  Constitutional: Negative.   HENT: Negative.   Eyes: Negative.   Respiratory: Negative.   Cardiovascular: Negative.   Gastrointestinal: Negative.   Genitourinary: Negative.   Musculoskeletal: Positive for joint pain. Negative for back pain, falls, myalgias and neck pain.       Right hip pain  Skin: Negative.   Neurological: Negative.   Endo/Heme/Allergies: Negative.   Psychiatric/Behavioral: Negative.     Objective:  Physical Exam  Constitutional: He is oriented to person, place, and time. He appears well-developed and well-nourished. No distress.  HENT:  Head: Normocephalic and atraumatic.  Right Ear: External ear normal.  Left Ear: External ear normal.  Nose: Nose normal.  Mouth/Throat: Oropharynx is clear and moist.  Eyes: Conjunctivae and EOM are normal.  Neck: Normal range of motion. Neck supple.  Cardiovascular: Normal rate, regular rhythm, normal heart sounds and intact distal pulses.   No murmur heard. Respiratory: Effort normal. No respiratory distress. He has no wheezes.  GI: Soft. Bowel sounds are normal. He exhibits no distension. There is no tenderness.  Musculoskeletal:       Right hip: He exhibits decreased range of motion and decreased strength.       Left hip: Normal.       Right knee:  Normal.       Left knee: Normal.       Right lower leg: He exhibits no tenderness and no swelling.       Left lower leg: He exhibits no tenderness and no swelling.  Evaluation of his left hip flexion 110, rotation in 30, out 40, and abduct 40 without discomfort. The right hip flexion 90, no internal or external rotation, only about 5 degrees of abduction. He is walking with a significantly antalgic gait on the right side.  Neurological: He is alert and oriented to person, place, and time. He has normal strength and normal reflexes. No sensory deficit.  Skin: No rash noted. He is not diaphoretic. No erythema.  Psychiatric: He has a normal mood and affect. His behavior is normal.    Vitals Weight: 250 lb Height: 71 in Body Surface Area:  2.38 m Body Mass Index: 34.87 kg/m Pulse: 66 (Regular) BP: 140/76 (Sitting, Left Arm, Standard)  Imaging Review Plain radiographs demonstrate severe degenerative joint disease of the right hip(s). The bone quality appears to be fair for age and reported activity level.  Assessment/Plan:  End stage arthritis, right hip(s)  The patient history, physical examination, clinical judgement of the provider and imaging studies are consistent with end stage degenerative joint disease of the right hip(s) and total hip arthroplasty is deemed medically necessary. The treatment options including medical management, injection therapy, arthroscopy and arthroplasty were discussed at length. The risks and benefits of total hip arthroplasty were presented and reviewed. The risks due to aseptic loosening, infection, stiffness, dislocation/subluxation,  thromboembolic complications and other imponderables were discussed.  The patient acknowledged the explanation, agreed to proceed with the plan and consent was signed. Patient is being admitted for inpatient treatment for surgery, pain control, PT, OT, prophylactic antibiotics, VTE prophylaxis, progressive ambulation  and ADL's and discharge planning.The patient is planning to be discharged home with home health services     Patient will NOT receive TXA   Dimitri Ped, PA-C

## 2013-01-24 ENCOUNTER — Encounter: Payer: Self-pay | Admitting: Internal Medicine

## 2013-01-24 ENCOUNTER — Ambulatory Visit (INDEPENDENT_AMBULATORY_CARE_PROVIDER_SITE_OTHER): Payer: Medicare Other | Admitting: Internal Medicine

## 2013-01-24 VITALS — BP 132/62 | HR 76 | Temp 98.1°F | Resp 16 | Wt 252.0 lb

## 2013-01-24 DIAGNOSIS — I10 Essential (primary) hypertension: Secondary | ICD-10-CM

## 2013-01-24 DIAGNOSIS — Z23 Encounter for immunization: Secondary | ICD-10-CM

## 2013-01-24 DIAGNOSIS — M169 Osteoarthritis of hip, unspecified: Secondary | ICD-10-CM

## 2013-01-24 DIAGNOSIS — E785 Hyperlipidemia, unspecified: Secondary | ICD-10-CM

## 2013-01-24 DIAGNOSIS — M1611 Unilateral primary osteoarthritis, right hip: Secondary | ICD-10-CM

## 2013-01-24 DIAGNOSIS — E119 Type 2 diabetes mellitus without complications: Secondary | ICD-10-CM

## 2013-01-24 MED ORDER — NITROGLYCERIN 0.4 MG SL SUBL: 0.4000 mg | SUBLINGUAL_TABLET | SUBLINGUAL | Status: DC | PRN

## 2013-01-24 MED ORDER — METFORMIN HCL 1000 MG PO TABS: 1000.0000 mg | ORAL_TABLET | Freq: Two times a day (BID) | ORAL | Status: DC

## 2013-01-24 MED ORDER — ENALAPRIL MALEATE 10 MG PO TABS: 10.0000 mg | ORAL_TABLET | Freq: Every day | ORAL | Status: DC

## 2013-01-24 MED ORDER — GLIMEPIRIDE 4 MG PO TABS: 4.0000 mg | ORAL_TABLET | Freq: Every day | ORAL | Status: DC

## 2013-01-24 MED ORDER — DOXAZOSIN MESYLATE 8 MG PO TABS: 8.0000 mg | ORAL_TABLET | Freq: Every day | ORAL | Status: DC

## 2013-01-24 MED ORDER — PANTOPRAZOLE SODIUM 40 MG PO TBEC: 40.0000 mg | DELAYED_RELEASE_TABLET | Freq: Every day | ORAL | Status: DC

## 2013-01-24 MED ORDER — LOVASTATIN 20 MG PO TABS: 20.0000 mg | ORAL_TABLET | Freq: Every day | ORAL | Status: DC

## 2013-01-24 NOTE — Progress Notes (Signed)
   Subjective:    HPI  The patient presents for a follow-up of  chronic hypertension, chronic dyslipidemia, type 2 diabetes controlled with medicines. C/o ED, penile pump was fixed Needs R THR - sch for 01/30/13    Wt Readings from Last 3 Encounters:  01/24/13 252 lb (114.306 kg)  01/21/13 253 lb 4 oz (114.873 kg)  10/19/12 250 lb (113.399 kg)   BP Readings from Last 3 Encounters:  01/24/13 132/62  01/21/13 164/74  10/19/12 130/64      Review of Systems  Constitutional: Negative for appetite change, fatigue and unexpected weight change.  HENT: Negative for congestion, nosebleeds, sneezing and sore throat.   Eyes: Negative for itching and visual disturbance.  Respiratory: Negative for cough and shortness of breath.   Cardiovascular: Negative for palpitations and leg swelling.  Gastrointestinal: Negative for nausea, diarrhea, blood in stool and abdominal distention.  Genitourinary: Negative for frequency and hematuria.  Musculoskeletal: Negative for back pain, gait problem and joint swelling.  Skin: Negative for rash.  Neurological: Negative for dizziness, tremors and speech difficulty.  Psychiatric/Behavioral: Negative for sleep disturbance, dysphoric mood and agitation. The patient is not nervous/anxious.        Objective:   Physical Exam  Constitutional: He is oriented to person, place, and time. He appears well-developed.  Obese   HENT:  Mouth/Throat: Oropharynx is clear and moist.  Eyes: Conjunctivae are normal. Pupils are equal, round, and reactive to light.  Neck: Normal range of motion. No JVD present. No thyromegaly present.  Cardiovascular: Normal rate, regular rhythm, normal heart sounds and intact distal pulses.  Exam reveals no gallop and no friction rub.   No murmur heard. Pulmonary/Chest: Effort normal and breath sounds normal. No respiratory distress. He has no wheezes. He has no rales. He exhibits no tenderness.  Abdominal: Soft. Bowel sounds are  normal. He exhibits no distension and no mass. There is no tenderness. There is no rebound and no guarding.  Musculoskeletal: Normal range of motion. He exhibits no edema and no tenderness.  Lymphadenopathy:    He has no cervical adenopathy.  Neurological: He is alert and oriented to person, place, and time. He has normal reflexes. No cranial nerve deficit. He exhibits normal muscle tone. Coordination normal.  Skin: Skin is warm and dry. No rash noted.  Psychiatric: He has a normal mood and affect. His behavior is normal. Judgment and thought content normal.  R hip is tender Cane  Lab Results  Component Value Date   WBC 6.4 01/21/2013   HGB 11.6* 01/21/2013   HCT 35.6* 01/21/2013   PLT 267 01/21/2013   GLUCOSE 167* 01/21/2013   CHOL 136 07/14/2011   TRIG 70.0 07/14/2011   HDL 32.50* 07/14/2011   LDLDIRECT 78.1 09/16/2008   LDLCALC 90 07/14/2011   ALT 13 01/21/2013   AST 13 01/21/2013   NA 138 01/21/2013   K 3.9 01/21/2013   CL 103 01/21/2013   CREATININE 0.87 01/21/2013   BUN 9 01/21/2013   CO2 26 01/21/2013   TSH 3.59 10/14/2010   PSA 1.28 10/24/2008   INR 1.15 01/21/2013   HGBA1C 7.6* 10/19/2012        Assessment & Plan:

## 2013-01-24 NOTE — Progress Notes (Signed)
Pre visit review using our clinic review tool, if applicable. No additional management support is needed unless otherwise documented below in the visit note. 

## 2013-01-26 NOTE — Assessment & Plan Note (Signed)
End stage Dr Lequita Halt Surgery is pending on 11/26

## 2013-01-26 NOTE — Assessment & Plan Note (Signed)
Continue with current prescription therapy as reflected on the Med list.  

## 2013-01-30 ENCOUNTER — Encounter (HOSPITAL_COMMUNITY): Payer: Medicare Other | Admitting: Anesthesiology

## 2013-01-30 ENCOUNTER — Inpatient Hospital Stay (HOSPITAL_COMMUNITY): Payer: Medicare Other

## 2013-01-30 ENCOUNTER — Encounter (HOSPITAL_COMMUNITY): Payer: Self-pay | Admitting: *Deleted

## 2013-01-30 ENCOUNTER — Ambulatory Visit (HOSPITAL_COMMUNITY): Payer: Medicare Other | Admitting: Anesthesiology

## 2013-01-30 ENCOUNTER — Encounter (HOSPITAL_COMMUNITY)
Admission: RE | Disposition: A | Discharge status: Home Health | Payer: Self-pay | Source: Ambulatory Visit | Attending: Orthopedic Surgery

## 2013-01-30 ENCOUNTER — Inpatient Hospital Stay (HOSPITAL_COMMUNITY)
Admission: RE | Admit: 2013-01-30 | Discharge: 2013-02-01 | DRG: 470 | Disposition: A | Discharge status: Home Health | Payer: Medicare Other | Source: Ambulatory Visit | Attending: Orthopedic Surgery | Admitting: Orthopedic Surgery

## 2013-01-30 DIAGNOSIS — D649 Anemia, unspecified: Secondary | ICD-10-CM | POA: Diagnosis present

## 2013-01-30 DIAGNOSIS — Z951 Presence of aortocoronary bypass graft: Secondary | ICD-10-CM

## 2013-01-30 DIAGNOSIS — Z833 Family history of diabetes mellitus: Secondary | ICD-10-CM

## 2013-01-30 DIAGNOSIS — M169 Osteoarthritis of hip, unspecified: Secondary | ICD-10-CM | POA: Diagnosis present

## 2013-01-30 DIAGNOSIS — Z8249 Family history of ischemic heart disease and other diseases of the circulatory system: Secondary | ICD-10-CM

## 2013-01-30 DIAGNOSIS — M1610 Unilateral primary osteoarthritis, unspecified hip: Principal | ICD-10-CM | POA: Diagnosis present

## 2013-01-30 DIAGNOSIS — Z87891 Personal history of nicotine dependence: Secondary | ICD-10-CM

## 2013-01-30 DIAGNOSIS — N400 Enlarged prostate without lower urinary tract symptoms: Secondary | ICD-10-CM | POA: Diagnosis present

## 2013-01-30 DIAGNOSIS — E119 Type 2 diabetes mellitus without complications: Secondary | ICD-10-CM | POA: Diagnosis present

## 2013-01-30 DIAGNOSIS — Z96649 Presence of unspecified artificial hip joint: Secondary | ICD-10-CM

## 2013-01-30 DIAGNOSIS — E785 Hyperlipidemia, unspecified: Secondary | ICD-10-CM | POA: Diagnosis present

## 2013-01-30 DIAGNOSIS — K219 Gastro-esophageal reflux disease without esophagitis: Secondary | ICD-10-CM | POA: Diagnosis present

## 2013-01-30 DIAGNOSIS — Z9849 Cataract extraction status, unspecified eye: Secondary | ICD-10-CM

## 2013-01-30 DIAGNOSIS — I2510 Atherosclerotic heart disease of native coronary artery without angina pectoris: Secondary | ICD-10-CM | POA: Diagnosis present

## 2013-01-30 DIAGNOSIS — I10 Essential (primary) hypertension: Secondary | ICD-10-CM | POA: Diagnosis present

## 2013-01-30 HISTORY — PX: TOTAL HIP ARTHROPLASTY: SHX124

## 2013-01-30 LAB — GLUCOSE, CAPILLARY
Glucose-Capillary: 122 mg/dL — ABNORMAL HIGH (ref 70–99)
Glucose-Capillary: 131 mg/dL — ABNORMAL HIGH (ref 70–99)

## 2013-01-30 LAB — TYPE AND SCREEN: ABO/RH(D): O POS

## 2013-01-30 SURGERY — ARTHROPLASTY, HIP, TOTAL,POSTERIOR APPROACH
Anesthesia: General | Site: Hip | Laterality: Right | Wound class: Clean

## 2013-01-30 MED ORDER — NITROGLYCERIN 0.4 MG SL SUBL: 0.4000 mg | SUBLINGUAL_TABLET | SUBLINGUAL | Status: DC | PRN

## 2013-01-30 MED ORDER — LACTATED RINGERS IV SOLN: INTRAVENOUS | Status: DC

## 2013-01-30 MED ORDER — CEFAZOLIN SODIUM-DEXTROSE 2-3 GM-% IV SOLR
2.0000 g | INTRAVENOUS | Status: AC
  Administered 2013-01-30: 2 g via INTRAVENOUS

## 2013-01-30 MED ORDER — PROMETHAZINE HCL 25 MG/ML IJ SOLN
6.2500 mg | INTRAMUSCULAR | Status: DC | PRN
  Administered 2013-01-30: 6.25 mg via INTRAVENOUS

## 2013-01-30 MED ORDER — PROPOFOL 10 MG/ML IV BOLUS
INTRAVENOUS | Status: AC
  Filled 2013-01-30: qty 20

## 2013-01-30 MED ORDER — LACTATED RINGERS IV SOLN
INTRAVENOUS | Status: DC
Start: 2013-01-30 — End: 2013-01-30
  Administered 2013-01-30: 1000 mL via INTRAVENOUS

## 2013-01-30 MED ORDER — ENALAPRIL MALEATE 10 MG PO TABS
10.0000 mg | ORAL_TABLET | Freq: Every day | ORAL | Status: DC
  Administered 2013-02-01: 10 mg via ORAL
  Filled 2013-01-30 (×2): qty 1

## 2013-01-30 MED ORDER — METFORMIN HCL 500 MG PO TABS
1000.0000 mg | ORAL_TABLET | Freq: Two times a day (BID) | ORAL | Status: DC
  Administered 2013-01-30 – 2013-02-01 (×4): 1000 mg via ORAL
  Filled 2013-01-30 (×6): qty 2

## 2013-01-30 MED ORDER — SODIUM CHLORIDE 0.9 % IV SOLN: INTRAVENOUS | Status: DC

## 2013-01-30 MED ORDER — DEXAMETHASONE SODIUM PHOSPHATE 10 MG/ML IJ SOLN
10.0000 mg | Freq: Every day | INTRAMUSCULAR | Status: AC
  Filled 2013-01-30: qty 1

## 2013-01-30 MED ORDER — NEOSTIGMINE METHYLSULFATE 1 MG/ML IJ SOLN
INTRAMUSCULAR | Status: AC
  Filled 2013-01-30: qty 10

## 2013-01-30 MED ORDER — INSULIN ASPART 100 UNIT/ML ~~LOC~~ SOLN
0.0000 [IU] | Freq: Three times a day (TID) | SUBCUTANEOUS | Status: DC
  Administered 2013-01-31: 3 [IU] via SUBCUTANEOUS

## 2013-01-30 MED ORDER — KETOROLAC TROMETHAMINE 15 MG/ML IJ SOLN: 7.5000 mg | Freq: Four times a day (QID) | INTRAMUSCULAR | Status: AC | PRN

## 2013-01-30 MED ORDER — ONDANSETRON HCL 4 MG/2ML IJ SOLN
INTRAMUSCULAR | Status: DC | PRN
  Administered 2013-01-30: 4 mg via INTRAVENOUS

## 2013-01-30 MED ORDER — HYDROMORPHONE HCL PF 1 MG/ML IJ SOLN: 0.5000 mg | INTRAMUSCULAR | Status: DC | PRN

## 2013-01-30 MED ORDER — HYDROMORPHONE HCL 2 MG PO TABS
2.0000 mg | ORAL_TABLET | ORAL | Status: DC | PRN
  Administered 2013-01-30: 2 mg via ORAL
  Administered 2013-01-30: 4 mg via ORAL
  Administered 2013-01-31: 2 mg via ORAL
  Administered 2013-01-31: 07:00:00 4 mg via ORAL
  Filled 2013-01-30: qty 2
  Filled 2013-01-30: qty 1
  Filled 2013-01-30: qty 2
  Filled 2013-01-30: qty 1

## 2013-01-30 MED ORDER — ACETAMINOPHEN 500 MG PO TABS
1000.0000 mg | ORAL_TABLET | Freq: Four times a day (QID) | ORAL | Status: AC
  Administered 2013-01-30 – 2013-01-31 (×4): 1000 mg via ORAL
  Filled 2013-01-30 (×5): qty 2

## 2013-01-30 MED ORDER — HYDROMORPHONE HCL PF 1 MG/ML IJ SOLN
0.2500 mg | INTRAMUSCULAR | Status: DC | PRN
  Administered 2013-01-30 (×2): 0.25 mg via INTRAVENOUS

## 2013-01-30 MED ORDER — BISACODYL 10 MG RE SUPP: 10.0000 mg | Freq: Every day | RECTAL | Status: DC | PRN

## 2013-01-30 MED ORDER — FLEET ENEMA 7-19 GM/118ML RE ENEM: 1.0000 | ENEMA | Freq: Once | RECTAL | Status: AC | PRN

## 2013-01-30 MED ORDER — CHLORHEXIDINE GLUCONATE 4 % EX LIQD: 60.0000 mL | Freq: Once | CUTANEOUS | Status: DC

## 2013-01-30 MED ORDER — METOCLOPRAMIDE HCL 5 MG/ML IJ SOLN: 5.0000 mg | Freq: Three times a day (TID) | INTRAMUSCULAR | Status: DC | PRN

## 2013-01-30 MED ORDER — RIVAROXABAN 10 MG PO TABS
10.0000 mg | ORAL_TABLET | Freq: Every day | ORAL | Status: DC
  Administered 2013-01-31 – 2013-02-01 (×2): 10 mg via ORAL
  Filled 2013-01-30 (×3): qty 1

## 2013-01-30 MED ORDER — ONDANSETRON HCL 4 MG/2ML IJ SOLN
INTRAMUSCULAR | Status: AC
  Filled 2013-01-30: qty 2

## 2013-01-30 MED ORDER — HYDROMORPHONE HCL PF 2 MG/ML IJ SOLN
INTRAMUSCULAR | Status: AC
  Filled 2013-01-30: qty 1

## 2013-01-30 MED ORDER — NEOSTIGMINE METHYLSULFATE 1 MG/ML IJ SOLN
INTRAMUSCULAR | Status: DC | PRN
  Administered 2013-01-30 (×3): 5 mg via INTRAVENOUS

## 2013-01-30 MED ORDER — GLYCOPYRROLATE 0.2 MG/ML IJ SOLN
INTRAMUSCULAR | Status: DC | PRN
  Administered 2013-01-30: 0.6 mg via INTRAVENOUS

## 2013-01-30 MED ORDER — ACETAMINOPHEN 650 MG RE SUPP: 650.0000 mg | Freq: Four times a day (QID) | RECTAL | Status: DC | PRN

## 2013-01-30 MED ORDER — DIPHENHYDRAMINE HCL 12.5 MG/5ML PO ELIX: 12.5000 mg | ORAL_SOLUTION | ORAL | Status: DC | PRN

## 2013-01-30 MED ORDER — MENTHOL 3 MG MT LOZG
1.0000 | LOZENGE | OROMUCOSAL | Status: DC | PRN
  Filled 2013-01-30: qty 9

## 2013-01-30 MED ORDER — DOCUSATE SODIUM 100 MG PO CAPS
100.0000 mg | ORAL_CAPSULE | Freq: Two times a day (BID) | ORAL | Status: DC
  Administered 2013-01-30 – 2013-02-01 (×4): 100 mg via ORAL

## 2013-01-30 MED ORDER — SUCCINYLCHOLINE CHLORIDE 20 MG/ML IJ SOLN
INTRAMUSCULAR | Status: DC | PRN
  Administered 2013-01-30: 100 mg via INTRAVENOUS

## 2013-01-30 MED ORDER — METHOCARBAMOL 500 MG PO TABS: 500.0000 mg | ORAL_TABLET | Freq: Four times a day (QID) | ORAL | Status: DC | PRN

## 2013-01-30 MED ORDER — GLIMEPIRIDE 4 MG PO TABS
4.0000 mg | ORAL_TABLET | Freq: Every day | ORAL | Status: DC
Start: 2013-01-31 — End: 2013-02-01
  Administered 2013-01-31 – 2013-02-01 (×2): 4 mg via ORAL
  Filled 2013-01-30 (×3): qty 1

## 2013-01-30 MED ORDER — ACETAMINOPHEN 325 MG PO TABS: 650.0000 mg | ORAL_TABLET | Freq: Four times a day (QID) | ORAL | Status: DC | PRN

## 2013-01-30 MED ORDER — STERILE WATER FOR IRRIGATION IR SOLN
Status: DC | PRN
  Administered 2013-01-30: 3000 mL

## 2013-01-30 MED ORDER — SODIUM CHLORIDE 0.9 % IJ SOLN
INTRAMUSCULAR | Status: DC | PRN
  Administered 2013-01-30: 30 mL

## 2013-01-30 MED ORDER — BUPIVACAINE LIPOSOME 1.3 % IJ SUSP
20.0000 mL | Freq: Once | INTRAMUSCULAR | Status: DC
  Filled 2013-01-30: qty 20

## 2013-01-30 MED ORDER — POTASSIUM CHLORIDE IN NACL 20-0.9 MEQ/L-% IV SOLN
INTRAVENOUS | Status: DC
  Administered 2013-01-30 – 2013-01-31 (×2): via INTRAVENOUS
  Filled 2013-01-30 (×6): qty 1000

## 2013-01-30 MED ORDER — CEFAZOLIN SODIUM-DEXTROSE 2-3 GM-% IV SOLR
INTRAVENOUS | Status: AC
  Filled 2013-01-30: qty 50

## 2013-01-30 MED ORDER — FENTANYL CITRATE 0.05 MG/ML IJ SOLN
INTRAMUSCULAR | Status: DC | PRN
  Administered 2013-01-30: 50 ug via INTRAVENOUS
  Administered 2013-01-30: 150 ug via INTRAVENOUS
  Administered 2013-01-30: 50 ug via INTRAVENOUS

## 2013-01-30 MED ORDER — BUPIVACAINE HCL (PF) 0.25 % IJ SOLN
INTRAMUSCULAR | Status: AC
  Filled 2013-01-30: qty 30

## 2013-01-30 MED ORDER — LABETALOL HCL 5 MG/ML IV SOLN
INTRAVENOUS | Status: DC | PRN
  Administered 2013-01-30: 5 mg via INTRAVENOUS

## 2013-01-30 MED ORDER — GLYCOPYRROLATE 0.2 MG/ML IJ SOLN
INTRAMUSCULAR | Status: AC
  Filled 2013-01-30: qty 3

## 2013-01-30 MED ORDER — FENTANYL CITRATE 0.05 MG/ML IJ SOLN
INTRAMUSCULAR | Status: AC
  Filled 2013-01-30: qty 5

## 2013-01-30 MED ORDER — CEFAZOLIN SODIUM-DEXTROSE 2-3 GM-% IV SOLR
2.0000 g | Freq: Four times a day (QID) | INTRAVENOUS | Status: AC
  Administered 2013-01-30 – 2013-01-31 (×2): 2 g via INTRAVENOUS
  Filled 2013-01-30 (×2): qty 50

## 2013-01-30 MED ORDER — TRAMADOL HCL 50 MG PO TABS
50.0000 mg | ORAL_TABLET | Freq: Four times a day (QID) | ORAL | Status: DC | PRN
  Administered 2013-01-31 – 2013-02-01 (×3): 50 mg via ORAL
  Filled 2013-01-30 (×3): qty 1

## 2013-01-30 MED ORDER — METHOCARBAMOL 100 MG/ML IJ SOLN
500.0000 mg | Freq: Four times a day (QID) | INTRAVENOUS | Status: DC | PRN
  Filled 2013-01-30: qty 5

## 2013-01-30 MED ORDER — HYDROMORPHONE HCL PF 1 MG/ML IJ SOLN
INTRAMUSCULAR | Status: AC
  Filled 2013-01-30: qty 1

## 2013-01-30 MED ORDER — DOXAZOSIN MESYLATE 8 MG PO TABS
8.0000 mg | ORAL_TABLET | Freq: Every day | ORAL | Status: DC
  Administered 2013-01-30: 8 mg via ORAL
  Filled 2013-01-30 (×3): qty 1

## 2013-01-30 MED ORDER — BUPIVACAINE HCL 0.25 % IJ SOLN
INTRAMUSCULAR | Status: DC | PRN
  Administered 2013-01-30: 20 mL

## 2013-01-30 MED ORDER — 0.9 % SODIUM CHLORIDE (POUR BTL) OPTIME
TOPICAL | Status: DC | PRN
  Administered 2013-01-30: 1000 mL

## 2013-01-30 MED ORDER — LACTATED RINGERS IV SOLN
INTRAVENOUS | Status: DC | PRN
  Administered 2013-01-30 (×2): via INTRAVENOUS

## 2013-01-30 MED ORDER — HYDROMORPHONE HCL PF 1 MG/ML IJ SOLN
INTRAMUSCULAR | Status: DC | PRN
  Administered 2013-01-30: 0.5 mg via INTRAVENOUS
  Administered 2013-01-30: 1 mg via INTRAVENOUS
  Administered 2013-01-30: 0.5 mg via INTRAVENOUS

## 2013-01-30 MED ORDER — LIDOCAINE HCL (CARDIAC) 20 MG/ML IV SOLN
INTRAVENOUS | Status: DC | PRN
  Administered 2013-01-30: 50 mg via INTRAVENOUS

## 2013-01-30 MED ORDER — ROCURONIUM BROMIDE 100 MG/10ML IV SOLN
INTRAVENOUS | Status: AC
  Filled 2013-01-30: qty 1

## 2013-01-30 MED ORDER — DEXAMETHASONE SODIUM PHOSPHATE 10 MG/ML IJ SOLN: 10.0000 mg | Freq: Once | INTRAMUSCULAR | Status: DC

## 2013-01-30 MED ORDER — LIDOCAINE HCL (CARDIAC) 20 MG/ML IV SOLN
INTRAVENOUS | Status: AC
  Filled 2013-01-30: qty 5

## 2013-01-30 MED ORDER — METOCLOPRAMIDE HCL 10 MG PO TABS: 5.0000 mg | ORAL_TABLET | Freq: Three times a day (TID) | ORAL | Status: DC | PRN

## 2013-01-30 MED ORDER — POLYETHYLENE GLYCOL 3350 17 G PO PACK: 17.0000 g | PACK | Freq: Every day | ORAL | Status: DC | PRN

## 2013-01-30 MED ORDER — ONDANSETRON HCL 4 MG/2ML IJ SOLN: 4.0000 mg | Freq: Four times a day (QID) | INTRAMUSCULAR | Status: DC | PRN

## 2013-01-30 MED ORDER — PROMETHAZINE HCL 25 MG/ML IJ SOLN
INTRAMUSCULAR | Status: AC
  Filled 2013-01-30: qty 1

## 2013-01-30 MED ORDER — PROPOFOL 10 MG/ML IV BOLUS
INTRAVENOUS | Status: DC | PRN
  Administered 2013-01-30: 200 mg via INTRAVENOUS

## 2013-01-30 MED ORDER — SODIUM CHLORIDE 0.9 % IJ SOLN
INTRAMUSCULAR | Status: AC
  Filled 2013-01-30: qty 50

## 2013-01-30 MED ORDER — ROCURONIUM BROMIDE 100 MG/10ML IV SOLN
INTRAVENOUS | Status: DC | PRN
  Administered 2013-01-30: 35 mg via INTRAVENOUS

## 2013-01-30 MED ORDER — MIDAZOLAM HCL 5 MG/5ML IJ SOLN
INTRAMUSCULAR | Status: DC | PRN
  Administered 2013-01-30: 2 mg via INTRAVENOUS

## 2013-01-30 MED ORDER — SUCCINYLCHOLINE CHLORIDE 20 MG/ML IJ SOLN
INTRAMUSCULAR | Status: AC
Start: 2013-01-30 — End: 2013-01-30
  Filled 2013-01-30: qty 1

## 2013-01-30 MED ORDER — BUPIVACAINE LIPOSOME 1.3 % IJ SUSP
INTRAMUSCULAR | Status: DC | PRN
  Administered 2013-01-30: 20 mL

## 2013-01-30 MED ORDER — DEXAMETHASONE 4 MG PO TABS
10.0000 mg | ORAL_TABLET | Freq: Every day | ORAL | Status: AC
  Administered 2013-01-31: 10:00:00 10 mg via ORAL
  Filled 2013-01-30: qty 1

## 2013-01-30 MED ORDER — PHENOL 1.4 % MT LIQD
1.0000 | OROMUCOSAL | Status: DC | PRN
  Filled 2013-01-30: qty 177

## 2013-01-30 MED ORDER — PANTOPRAZOLE SODIUM 40 MG PO TBEC
40.0000 mg | DELAYED_RELEASE_TABLET | Freq: Every day | ORAL | Status: DC
  Administered 2013-01-31 – 2013-02-01 (×2): 40 mg via ORAL
  Filled 2013-01-30 (×2): qty 1

## 2013-01-30 MED ORDER — ONDANSETRON HCL 4 MG PO TABS: 4.0000 mg | ORAL_TABLET | Freq: Four times a day (QID) | ORAL | Status: DC | PRN

## 2013-01-30 MED ORDER — LABETALOL HCL 5 MG/ML IV SOLN
INTRAVENOUS | Status: AC
  Filled 2013-01-30: qty 4

## 2013-01-30 MED ORDER — MECLIZINE HCL 12.5 MG PO TABS
12.5000 mg | ORAL_TABLET | Freq: Three times a day (TID) | ORAL | Status: DC | PRN
  Filled 2013-01-30: qty 2

## 2013-01-30 MED ORDER — ACETAMINOPHEN 500 MG PO TABS
1000.0000 mg | ORAL_TABLET | Freq: Once | ORAL | Status: AC
  Administered 2013-01-30: 1000 mg via ORAL
  Filled 2013-01-30: qty 2

## 2013-01-30 SURGICAL SUPPLY — 51 items
BAG ZIPLOCK 12X15 (MISCELLANEOUS) ×2 IMPLANT
BIT DRILL 2.8X128 (BIT) ×2 IMPLANT
BLADE EXTENDED COATED 6.5IN (ELECTRODE) ×2 IMPLANT
BLADE SAW SAG 73X25 THK (BLADE) ×1
BLADE SAW SGTL 73X25 THK (BLADE) ×1 IMPLANT
CAPT HIP PF COP ×2 IMPLANT
DECANTER SPIKE VIAL GLASS SM (MISCELLANEOUS) ×2 IMPLANT
DRAPE INCISE IOBAN 66X45 STRL (DRAPES) ×2 IMPLANT
DRAPE ORTHO SPLIT 77X108 STRL (DRAPES) ×2
DRAPE POUCH INSTRU U-SHP 10X18 (DRAPES) ×2 IMPLANT
DRAPE SURG ORHT 6 SPLT 77X108 (DRAPES) ×2 IMPLANT
DRAPE U-SHAPE 47X51 STRL (DRAPES) ×2 IMPLANT
DRSG ADAPTIC 3X8 NADH LF (GAUZE/BANDAGES/DRESSINGS) ×2 IMPLANT
DRSG MEPILEX BORDER 4X4 (GAUZE/BANDAGES/DRESSINGS) ×2 IMPLANT
DRSG MEPILEX BORDER 4X8 (GAUZE/BANDAGES/DRESSINGS) ×2 IMPLANT
DURAPREP 26ML APPLICATOR (WOUND CARE) ×2 IMPLANT
ELECT REM PT RETURN 9FT ADLT (ELECTROSURGICAL) ×2
ELECTRODE REM PT RTRN 9FT ADLT (ELECTROSURGICAL) ×1 IMPLANT
EVACUATOR 1/8 PVC DRAIN (DRAIN) ×2 IMPLANT
FACESHIELD LNG OPTICON STERILE (SAFETY) ×8 IMPLANT
GLOVE BIO SURGEON STRL SZ7.5 (GLOVE) ×2 IMPLANT
GLOVE BIO SURGEON STRL SZ8 (GLOVE) ×2 IMPLANT
GLOVE BIOGEL PI IND STRL 8 (GLOVE) ×3 IMPLANT
GLOVE BIOGEL PI INDICATOR 8 (GLOVE) ×3
GLOVE SURG SS PI 6.5 STRL IVOR (GLOVE) ×4 IMPLANT
GOWN PREVENTION PLUS LG XLONG (DISPOSABLE) ×4 IMPLANT
GOWN STRL REIN XL XLG (GOWN DISPOSABLE) ×2 IMPLANT
IMMOBILIZER KNEE 20 (SOFTGOODS)
IMMOBILIZER KNEE 20 THIGH 36 (SOFTGOODS) IMPLANT
KIT BASIN OR (CUSTOM PROCEDURE TRAY) ×2 IMPLANT
MANIFOLD NEPTUNE II (INSTRUMENTS) ×2 IMPLANT
NDL SAFETY ECLIPSE 18X1.5 (NEEDLE) ×1 IMPLANT
NEEDLE HYPO 18GX1.5 SHARP (NEEDLE) ×1
NEEDLE HYPO 22GX1.5 SAFETY (NEEDLE) ×2 IMPLANT
NS IRRIG 1000ML POUR BTL (IV SOLUTION) ×2 IMPLANT
PACK TOTAL JOINT (CUSTOM PROCEDURE TRAY) ×2 IMPLANT
PASSER SUT SWANSON 36MM LOOP (INSTRUMENTS) ×2 IMPLANT
POSITIONER SURGICAL ARM (MISCELLANEOUS) ×2 IMPLANT
SPONGE GAUZE 4X4 12PLY (GAUZE/BANDAGES/DRESSINGS) ×2 IMPLANT
STRIP CLOSURE SKIN 1/2X4 (GAUZE/BANDAGES/DRESSINGS) ×4 IMPLANT
SUT ETHIBOND NAB CT1 #1 30IN (SUTURE) ×4 IMPLANT
SUT MNCRL AB 4-0 PS2 18 (SUTURE) ×2 IMPLANT
SUT VIC AB 2-0 CT1 27 (SUTURE) ×3
SUT VIC AB 2-0 CT1 TAPERPNT 27 (SUTURE) ×3 IMPLANT
SUT VLOC 180 0 24IN GS25 (SUTURE) ×4 IMPLANT
SYR 20CC LL (SYRINGE) ×2 IMPLANT
SYR 50ML LL SCALE MARK (SYRINGE) ×2 IMPLANT
TOWEL OR 17X26 10 PK STRL BLUE (TOWEL DISPOSABLE) ×4 IMPLANT
TOWEL OR NON WOVEN STRL DISP B (DISPOSABLE) ×2 IMPLANT
TRAY FOLEY CATH 14FRSI W/METER (CATHETERS) ×2 IMPLANT
WATER STERILE IRR 1500ML POUR (IV SOLUTION) ×2 IMPLANT

## 2013-01-30 NOTE — Interval H&P Note (Signed)
History and Physical Interval Note:  01/30/2013 11:50 AM  Johnny [name]  has presented today for surgery, with the diagnosis of RIGHT HIP OSTEOARTHRITIS  The various methods of treatment have been discussed with the patient and family. After consideration of risks, benefits and other options for treatment, the patient has consented to  Procedure(s): RIGHT TOTAL HIP ARTHROPLASTY (Right) as a surgical intervention .  The patient's history has been reviewed, patient examined, no change in status, stable for surgery.  I have reviewed the patient's chart and labs.  Questions were answered to the patient's satisfaction.     Loanne Drilling

## 2013-01-30 NOTE — Care Management Note (Signed)
  Page 1 of 1   01/30/2013     4:50:36 PM   CARE MANAGEMENT NOTE 01/30/2013  Patient:  Johnny [name], Johnny [name]   Account Number:  [id number]  Date Initiated:  01/30/2013  Documentation initiated by:  Colleen Can  Subjective/Objective Assessment:   dx total rt hip replacemnt     Action/Plan:   fresh post op/pt/ot pending   Anticipated DC Date:  02/02/2013   Anticipated DC Plan:  HOME W HOME HEALTH SERVICES      DC Planning Services  CM consult      Choice offered to / List presented to:             Status of service:  In process, will continue to follow Medicare Important Message given?   (If response is "NO", the following Medicare IM given date fields will be blank) Date Medicare IM given:   Date Additional Medicare IM given:    Discharge Disposition:    Per UR Regulation:  Reviewed for med. necessity/level of care/duration of stay  If discussed at Long Length of Stay Meetings, dates discussed:    Comments:

## 2013-01-30 NOTE — Anesthesia Postprocedure Evaluation (Signed)
  Anesthesia Post-op Note  Patient: Johnny [name]  Procedure(s) Performed: Procedure(s) (LRB): RIGHT TOTAL HIP ARTHROPLASTY (Right)  Patient Location: PACU  Anesthesia Type: General  Level of Consciousness: awake and alert   Airway and Oxygen Therapy: Patient Spontanous Breathing  Post-op Pain: mild  Post-op Assessment: Post-op Vital signs reviewed, Patient's Cardiovascular Status Stable, Respiratory Function Stable, Patent Airway and No signs of Nausea or vomiting  Last Vitals:  Filed Vitals:   01/30/13 1520  BP: 176/82  Pulse: 48  Temp:   Resp: 14    Post-op Vital Signs: stable   Complications: No apparent anesthesia complications

## 2013-01-30 NOTE — Transfer of Care (Signed)
Immediate Anesthesia Transfer of Care Note  Patient: Johnny [name]  Procedure(s) Performed: Procedure(s) (LRB): RIGHT TOTAL HIP ARTHROPLASTY (Right)  Patient Location: PACU  Anesthesia Type: General  Level of Consciousness: sedated, patient cooperative and responds to stimulation  Airway & Oxygen Therapy: Patient Spontanous Breathing and Patient connected to face mask oxgen  Post-op Assessment: Report given to PACU RN and Post -op Vital signs reviewed and stable  Post vital signs: Reviewed and stable  Complications: No apparent anesthesia complications

## 2013-01-30 NOTE — Op Note (Signed)
Pre-operative diagnosis- Osteoarthritis Right hip  Post-operative diagnosis- Osteoarthritis  Right hip  Procedure-  RightTotal Hip Arthroplasty  Surgeon- Gus Rankin. Savannaha Stonerock, MD  Assistant- Avel Peace, PA-C   Anesthesia  General  EBL- 300 ml   Drain Hemovac   Complication- None  Condition-PACU - hemodynamically stable.   Brief Clinical Note- Johnny [name] is a 67 y.o. male with end stage arthritis of his right hip with progressively worsening pain and dysfunction. Pain occurs with activity and rest including pain at night. He has tried analgesics, protected weight bearing and rest without benefit. Pain is too severe to attempt physical therapy. Radiographs demonstrate bone on bone arthritis with subchondral cyst formation. He presents now for right THA.  Procedure in detail-   The patient is brought into the operating room and placed on the operating table. After successful administration of General  anesthesia, the patient is placed in the  Left lateral decubitus position with the  Right side up and held in place with the hip positioner. The lower extremity is isolated from the perineum with plastic drapes and time-out is performed by the surgical team. The lower extremity is then prepped and draped in the usual sterile fashion. A short posterolateral incision is made with a ten blade through the subcutaneous tissue to the level of the fascia lata which is incised in line with the skin incision. The sciatic nerve is palpated and protected and the short external rotators and capsule are isolated from the femur. The hip is then dislocated and the center of the femoral head is marked. A trial prosthesis is placed such that the trial head corresponds to the center of the patients' native femoral head. The resection level is marked on the femoral neck and the resection is made with an oscillating saw. The femoral head is removed and femoral retractors placed to gain access to the femoral  canal.      The canal finder is passed into the femoral canal and the canal is thoroughly irrigated with sterile saline to remove the fatty contents. Axial reaming is performed to 17.5  mm, proximal reaming to 22 F  and the sleeve machined to a small. A 22 F small trial sleeve is placed into the proximal femur.      The femur is then retracted anteriorly to gain acetabular exposure. Acetabular retractors are placed and the labrum and osteophytes are removed, Acetabular reaming is performed to 55  mm and a 56  mm Pinnacle acetabular shell is placed in anatomic position with excellent purchase. Additional dome screws were not needed. The permanent 36 mm neutral + 4 Marathon liner is placed into the acetabular shell.      The trial femur is then placed into the femoral canal. The size is 22 x 17  stem with a 36 + 12  neck and a 36 + 6 head with the neck version matching  the patients' native anteversion. The hip is reduced with excellent stability with full extension and full external rotation, 70 degrees flexion with 40 degrees adduction and 90 degrees internal rotation and 90 degrees of flexion with 70 degrees of internal rotation. The operative leg is placed on top of the non-operative leg and the leg lengths are found to be equal. The trials are then removed and the permanent implant of the same size is impacted into the femoral canal. The ceramic femoral head of the same size as the trial is placed and the hip is reduced with the same  stability parameters. The operative leg is again placed on top of the non-operative leg and the leg lengths are found to be equal.      The wound is then copiously irrigated with saline solution and the capsule and short external rotators are re-attached to the femur through drill holes with Ethibond suture. The fascia lata is closed over a hemovac drain with #1 vicryl suture and the fascia lata, gluteal muscles and subcutaneous tissues are injected with Exparel 20ml diluted with  saline 30 ml plus 20 ml of .25% Marcaine. The subcutaneous tissues are closed with #1 and2-0 vicryl and the subcuticular layer closed with running 4-0 Monocryl. The drain is hooked to suction, incision cleaned and dried, and steri-srips and a bulky sterile dressing applied. The limb is placed into a knee immobilizer and the patient is awakened and transported to recovery in stable condition.      Please note that a surgical assistant was a medical necessity for this procedure in order to perform it in a safe and expeditious manner. The assistant was necessary to provide retraction to the vital neurovascular structures and to retract and position the limb to allow for anatomic placement of the prosthetic components.  Gus Rankin Anicia Leuthold, MD    01/30/2013, 1:36 PM

## 2013-01-30 NOTE — Anesthesia Preprocedure Evaluation (Addendum)
Anesthesia Evaluation  Patient identified by MRN, date of birth, ID band Patient awake    Reviewed: Allergy & Precautions, H&P , NPO status , Patient's Chart, lab work & pertinent test results  Airway Mallampati: II TM Distance: <3 FB Neck ROM: Full    Dental no notable dental hx.    Pulmonary former smoker,  breath sounds clear to auscultation  Pulmonary exam normal       Cardiovascular hypertension, Pt. on medications + CAD and + CABG Rhythm:Regular Rate:Normal     Neuro/Psych negative neurological ROS  negative psych ROS   GI/Hepatic Neg liver ROS,   Endo/Other  diabetesMorbid obesity  Renal/GU negative Renal ROS  negative genitourinary   Musculoskeletal negative musculoskeletal ROS (+)   Abdominal   Peds negative pediatric ROS (+)  Hematology  (+) anemia ,   Anesthesia Other Findings   Reproductive/Obstetrics negative OB ROS                          Anesthesia Physical Anesthesia Plan  ASA: III  Anesthesia Plan: General   Post-op Pain Management:    Induction: Intravenous  Airway Management Planned: Oral ETT  Additional Equipment:   Intra-op Plan:   Post-operative Plan: Extubation in OR  Informed Consent: I have reviewed the patients History and Physical, chart, labs and discussed the procedure including the risks, benefits and alternatives for the proposed anesthesia with the patient or authorized representative who has indicated his/her understanding and acceptance.   Dental advisory given  Plan Discussed with: CRNA and Surgeon  Anesthesia Plan Comments:         Anesthesia Quick Evaluation

## 2013-01-30 NOTE — Plan of Care (Signed)
Problem: Consults Goal: Diagnosis- Total Joint Replacement Right total hip     

## 2013-01-31 LAB — BASIC METABOLIC PANEL
BUN: 8 mg/dL (ref 6–23)
Calcium: 8.4 mg/dL (ref 8.4–10.5)
GFR calc non Af Amer: 89 mL/min — ABNORMAL LOW (ref >90)
Glucose, Bld: 131 mg/dL — ABNORMAL HIGH (ref 70–99)
Sodium: 135 mEq/L (ref 135–145)

## 2013-01-31 LAB — CBC
HCT: 31.9 % — ABNORMAL LOW (ref 39.0–52.0)
Hemoglobin: 10.3 g/dL — ABNORMAL LOW (ref 13.0–17.0)
MCH: 26.9 pg (ref 26.0–34.0)
MCHC: 32.3 g/dL (ref 30.0–36.0)

## 2013-01-31 LAB — GLUCOSE, CAPILLARY
Glucose-Capillary: 154 mg/dL — ABNORMAL HIGH (ref 70–99)
Glucose-Capillary: 149 mg/dL — ABNORMAL HIGH (ref 70–99)

## 2013-01-31 MED ORDER — METHOCARBAMOL 500 MG PO TABS: 500.0000 mg | ORAL_TABLET | Freq: Four times a day (QID) | ORAL | Status: DC | PRN

## 2013-01-31 MED ORDER — TRAMADOL HCL 50 MG PO TABS: 50.0000 mg | ORAL_TABLET | Freq: Four times a day (QID) | ORAL | Status: DC | PRN

## 2013-01-31 MED ORDER — RIVAROXABAN 10 MG PO TABS: 10.0000 mg | ORAL_TABLET | Freq: Every day | ORAL | Status: DC

## 2013-01-31 NOTE — Progress Notes (Signed)
Patient c/o feeling dizzy cold sweat, alert oriented x 3, cbg check 146, b/p 138/68 hr 61 sat 95% on room air, patient states i think the pain medicine is to strong, Dr Gerri Spore on floor and made aware, orders noted  D Susann Givens RN

## 2013-01-31 NOTE — Evaluation (Signed)
Physical Therapy Evaluation Patient Details Name: Johnny [name] MRN: [id number] DOB: [birth year] Today's Date: 01/31/2013 Time: 1135-1207 PT Time Calculation (min): 32 min  PT Assessment / Plan / Recommendation History of Present Illness     Clinical Impression  Pt s/p R THR presents with decreased R LE strength/ROM, posterior THP and post op pain limiting functional mobility.  Pt should progress to d/c home with family assist and HHPT follow up.    PT Assessment  Patient needs continued PT services    Follow Up Recommendations  Home health PT    Does the patient have the potential to tolerate intense rehabilitation      Barriers to Discharge        Equipment Recommendations  None recommended by PT    Recommendations for Other Services OT consult   Frequency 7X/week    Precautions / Restrictions Precautions Precautions: Posterior Hip Precaution Booklet Issued: Yes (comment) Precaution Comments: Sign hung in room - all precautions reviewed Restrictions Weight Bearing Restrictions: No Other Position/Activity Restrictions: WBAT   Pertinent Vitals/Pain 5/10; ice pack provided, pt declines MEDs, RN aware      Mobility  Bed Mobility Bed Mobility: Supine to Sit Supine to Sit: 3: Mod assist;With rails;HOB elevated Details for Bed Mobility Assistance: cues for sequence and use of L LE to self assist Transfers Transfers: Sit to Stand;Stand to Sit Sit to Stand: 3: Mod assist Stand to Sit: 4: Min assist Details for Transfer Assistance: cues for LE management and use of UEs to self assist Ambulation/Gait Ambulation/Gait Assistance: 4: Min assist Ambulation Distance (Feet): 48 Feet Assistive device: Rolling walker Ambulation/Gait Assistance Details: cues for sequence, ER on R, posture, position from RW Gait Pattern: Step-to pattern;Decreased step length - right;Decreased step length - left;Shuffle;Antalgic    Exercises Total Joint Exercises Ankle Circles/Pumps: AROM;10  reps;Supine;Both Quad Sets: AROM;Both;10 reps;Supine Heel Slides: AAROM;15 reps;Supine;Right Hip ABduction/ADduction: AAROM;Both;15 reps;Supine   PT Diagnosis: Difficulty walking  PT Problem List: Decreased strength;Decreased range of motion;Decreased activity tolerance;Decreased mobility;Decreased knowledge of use of DME;Pain;Decreased knowledge of precautions PT Treatment Interventions: DME instruction;Gait training;Stair training;Functional mobility training;Therapeutic activities;Therapeutic exercise;Patient/family education     PT Goals(Current goals can be found in the care plan section) Acute Rehab PT Goals Patient Stated Goal: Resume previous lifestyle with decreased pain PT Goal Formulation: With patient Time For Goal Achievement: 02/07/13 Potential to Achieve Goals: Good  Visit Information  Last PT Received On: 01/31/13 Assistance Needed: +1       Prior Functioning  Home Living Family/patient expects to be discharged to:: Private residence Living Arrangements: Spouse/significant other Available Help at Discharge: Family Type of Home: House Home Access: Stairs to enter Secretary/administrator of Steps: 3 Entrance Stairs-Rails: Right Home Layout: Able to live on main level with bedroom/bathroom Home Equipment: Walker - 2 wheels Prior Function Level of Independence: Independent;Independent with assistive device(s) Communication Communication: No difficulties Dominant Hand: Right    Cognition  Cognition Arousal/Alertness: Awake/alert Behavior During Therapy: WFL for tasks assessed/performed Overall Cognitive Status: Within Functional Limits for tasks assessed    Extremity/Trunk Assessment Upper Extremity Assessment Upper Extremity Assessment: Overall WFL for tasks assessed Lower Extremity Assessment Lower Extremity Assessment: RLE deficits/detail RLE Deficits / Details: Hip strength 2/5 with AAROM to 75 hip flex and 15 abd   Balance    End of Session PT - End  of Session Equipment Utilized During Treatment: Gait belt Activity Tolerance: Patient tolerated treatment well Patient left: in chair;with call bell/phone within reach;with family/visitor present Nurse Communication:  Mobility status  GP     Colter Magowan 01/31/2013, 1:11 PM

## 2013-01-31 NOTE — Progress Notes (Signed)
OT Cancellation Note  Patient Details Name: LADAMIEN RAMMEL MRN: [id number] DOB: December 07, 1945   Cancelled Treatment:    Reason Eval/Treat Not Completed: Other (comment)  Pt was not feeling well when therapy checked earlier.  Will check back tomorrow.    Layne Lebon 01/31/2013, 11:08 AM Marica Otter, OTR/L [phone] 01/31/2013

## 2013-01-31 NOTE — Progress Notes (Signed)
   Subjective: 1 Day Post-Op Procedure(s) (LRB): RIGHT TOTAL HIP ARTHROPLASTY (Right) Patient reports pain as mild.   We will start therapy today.  Plan is to go Home after hospital stay.  Objective: Vital signs in last 24 hours: Temp:  [97.6 F (36.4 C)-98.6 F (37 C)] 98.3 F (36.8 C) (11/27 0535) Pulse Rate:  [48-72] 65 (11/27 0535) Resp:  [12-20] 16 (11/27 0535) BP: (123-176)/(63-85) 137/83 mmHg (11/27 0535) SpO2:  [10 %-100 %] 100 % (11/27 0535) Weight:  [252 lb (114.306 kg)] 252 lb (114.306 kg) (11/26 1515)  Intake/Output from previous day:  Intake/Output Summary (Last 24 hours) at 01/31/13 0857 Last data filed at 01/31/13 0657  Gross per 24 hour  Intake   4085 ml  Output   1955 ml  Net   2130 ml    Intake/Output this shift:    Labs:  Recent Labs  01/31/13 0355  HGB 10.3*    Recent Labs  01/31/13 0355  WBC 6.3  RBC 3.83*  HCT 31.9*  PLT 219    Recent Labs  01/31/13 0355  NA 135  K 4.2  CL 101  CO2 25  BUN 8  CREATININE 0.82  GLUCOSE 131*  CALCIUM 8.4   No results found for this basename: LABPT, INR,  in the last 72 hours  EXAM General - Patient is Alert, Appropriate and Oriented Extremity - Neurologically intact Neurovascular intact No cellulitis present Compartment soft Dressing - dressing C/D/I Motor Function - intact, moving foot and toes well on exam.  Hemovac pulled without difficulty.  Past Medical History  Diagnosis Date  . Benign prostatic hypertrophy   . Arthritis   . Benign positional vertigo   . Chronic ischemic heart disease, unspecified   . Family history of ischemic heart disease   . Hyperlipidemia   . Esophageal reflux   . CAD (coronary artery disease)   . Observation for suspected malignant neoplasm   . Routine general medical examination at a health care facility   . Balanitis   . HTN (hypertension)   . ED (erectile dysfunction)   . Type II or unspecified type diabetes mellitus without mention of  complication, not stated as uncontrolled 01-13-11    oral meds only    Assessment/Plan: 1 Day Post-Op Procedure(s) (LRB): RIGHT TOTAL HIP ARTHROPLASTY (Right) Principal Problem:   OA (osteoarthritis) of hip   Advance diet Up with therapy Plan for discharge tomorrow  DVT Prophylaxis - Xarelto Weight Bearing As Tolerated right Leg D/C Knee Immobilizer Hemovac Pulled Begin Therapy Hip Preacutions   Loanne Drilling

## 2013-01-31 NOTE — Progress Notes (Signed)
Physical Therapy Treatment Patient Details Name: KAZUMA ELENA MRN: [id number] DOB: Apr 06, 1945 Today's Date: 01/31/2013 Time: 4098-1191 PT Time Calculation (min): 24 min  PT Assessment / Plan / Recommendation  History of Present Illness     PT Comments     Follow Up Recommendations  Home health PT     Does the patient have the potential to tolerate intense rehabilitation     Barriers to Discharge        Equipment Recommendations  None recommended by PT    Recommendations for Other Services OT consult  Frequency 7X/week   Progress towards PT Goals Progress towards PT goals: Progressing toward goals  Plan Current plan remains appropriate    Precautions / Restrictions Precautions Precautions: Posterior Hip Precaution Booklet Issued: Yes (comment) Precaution Comments: Pt recalls 2/3 THP without cues Restrictions Weight Bearing Restrictions: No Other Position/Activity Restrictions: WBAT   Pertinent Vitals/Pain 5-6/10; MEDS requested    Mobility  Bed Mobility Bed Mobility: Sit to Supine Supine to Sit: 3: Mod assist;With rails;HOB elevated Sit to Supine: 3: Mod assist Details for Bed Mobility Assistance: cues for sequence and use of L LE to self assist Transfers Transfers: Sit to Stand;Stand to Sit Sit to Stand: 4: Min assist Stand to Sit: 4: Min assist Details for Transfer Assistance: cues for LE management and use of UEs to self assist Ambulation/Gait Ambulation/Gait Assistance: 4: Min assist Ambulation Distance (Feet): 108 Feet Assistive device: Rolling walker Ambulation/Gait Assistance Details: cues for sequence, posture, position from RW, and ER on R Gait Pattern: Step-to pattern;Decreased step length - right;Decreased step length - left;Shuffle;Antalgic    Exercises Total Joint Exercises Ankle Circles/Pumps: AROM;10 reps;Supine;Both Quad Sets: AROM;Both;10 reps;Supine Heel Slides: AAROM;15 reps;Supine;Right Hip ABduction/ADduction: AAROM;Both;15  reps;Supine   PT Diagnosis: Difficulty walking  PT Problem List: Decreased strength;Decreased range of motion;Decreased activity tolerance;Decreased mobility;Decreased knowledge of use of DME;Pain;Decreased knowledge of precautions PT Treatment Interventions: DME instruction;Gait training;Stair training;Functional mobility training;Therapeutic activities;Therapeutic exercise;Patient/family education   PT Goals (current goals can now be found in the care plan section) Acute Rehab PT Goals Patient Stated Goal: Resume previous lifestyle with decreased pain PT Goal Formulation: With patient Time For Goal Achievement: 02/07/13 Potential to Achieve Goals: Good  Visit Information  Last PT Received On: 01/31/13 Assistance Needed: +1    Subjective Data  Patient Stated Goal: Resume previous lifestyle with decreased pain   Cognition  Cognition Arousal/Alertness: Awake/alert Behavior During Therapy: WFL for tasks assessed/performed Overall Cognitive Status: Within Functional Limits for tasks assessed    Balance     End of Session PT - End of Session Equipment Utilized During Treatment: Gait belt Activity Tolerance: Patient tolerated treatment well Patient left: in bed;with call bell/phone within reach;with family/visitor present Nurse Communication: Mobility status   GP     Hakan Nudelman 01/31/2013, 3:31 PM

## 2013-02-01 ENCOUNTER — Encounter (HOSPITAL_COMMUNITY): Payer: Self-pay | Admitting: Orthopedic Surgery

## 2013-02-01 LAB — GLUCOSE, CAPILLARY: Glucose-Capillary: 175 mg/dL — ABNORMAL HIGH (ref 70–99)

## 2013-02-01 LAB — BASIC METABOLIC PANEL
CO2: 23 mEq/L (ref 19–32)
Calcium: 9.2 mg/dL (ref 8.4–10.5)
Chloride: 100 mEq/L (ref 96–112)
Creatinine, Ser: 0.79 mg/dL (ref 0.50–1.35)
GFR calc Af Amer: >90 mL/min (ref >90)
Potassium: 4.3 mEq/L (ref 3.5–5.1)

## 2013-02-01 LAB — CBC
Hemoglobin: 11.1 g/dL — ABNORMAL LOW (ref 13.0–17.0)
MCH: 27.7 pg (ref 26.0–34.0)
MCV: 82.8 fL (ref 78.0–100.0)
Platelets: 244 10*3/uL (ref 150–400)
RBC: 4.01 MIL/uL — ABNORMAL LOW (ref 4.22–5.81)
RDW: 14.8 % (ref 11.5–15.5)
WBC: 9.7 10*3/uL (ref 4.0–10.5)

## 2013-02-01 NOTE — Progress Notes (Signed)
   Subjective: 2 Days Post-Op Procedure(s) (LRB): RIGHT TOTAL HIP ARTHROPLASTY (Right) Patient reports pain as mild.  Plan is to go Home after hospital stay.  Objective: Vital signs in last 24 hours: Temp:  [98.1 F (36.7 C)-98.9 F (37.2 C)] 98.9 F (37.2 C) (11/28 0500) Pulse Rate:  [63-75] 71 (11/28 0500) Resp:  [16-24] 18 (11/28 0800) BP: (143-155)/(60-73) 150/70 mmHg (11/28 0500) SpO2:  [97 %-99 %] 99 % (11/28 0500)  Intake/Output from previous day:  Intake/Output Summary (Last 24 hours) at 02/01/13 0824 Last data filed at 02/01/13 0800  Gross per 24 hour  Intake 1418.33 ml  Output   2300 ml  Net -881.67 ml    Intake/Output this shift: Total I/O In: 360 [P.O.:360] Out: -   Labs:  Recent Labs  01/31/13 0355 02/01/13 0400  HGB 10.3* 11.1*    Recent Labs  01/31/13 0355 02/01/13 0400  WBC 6.3 9.7  RBC 3.83* 4.01*  HCT 31.9* 33.2*  PLT 219 244    Recent Labs  01/31/13 0355 02/01/13 0400  NA 135 136  K 4.2 4.3  CL 101 100  CO2 25 23  BUN 8 10  CREATININE 0.82 0.79  GLUCOSE 131* 200*  CALCIUM 8.4 9.2   No results found for this basename: LABPT, INR,  in the last 72 hours  EXAM General - Patient is Alert, Appropriate and Oriented Extremity - Neurologically intact Neurovascular intact Incision: dressing C/D/I No cellulitis present Compartment soft Dressing/Incision - clean, dry, no drainage Motor Function - intact, moving foot and toes well on exam.   Past Medical History  Diagnosis Date  . Benign prostatic hypertrophy   . Arthritis   . Benign positional vertigo   . Chronic ischemic heart disease, unspecified   . Family history of ischemic heart disease   . Hyperlipidemia   . Esophageal reflux   . CAD (coronary artery disease)   . Observation for suspected malignant neoplasm   . Routine general medical examination at a health care facility   . Balanitis   . HTN (hypertension)   . ED (erectile dysfunction)   . Type II or  unspecified type diabetes mellitus without mention of complication, not stated as uncontrolled 01-13-11    oral meds only    Assessment/Plan: 2 Days Post-Op Procedure(s) (LRB): RIGHT TOTAL HIP ARTHROPLASTY (Right) Principal Problem:   OA (osteoarthritis) of hip   D/C IV fluids Discharge home with home health  DVT Prophylaxis - Xarelto Weight Bearing As Tolerated right Leg  Alberta Cairns V 02/01/2013, 8:24 AM

## 2013-02-01 NOTE — Progress Notes (Signed)
Pt to d/c home with [facility] home [facility]. No DME needs. AVS reviewed and "My Chart" discussed with pt. Pt capable of verbalizing medications and follow-up appointments. Remains hemodynamically stable. No signs and symptoms of distress. Educated pt to return to ER in the case of SOB, dizziness, or chest pain.

## 2013-02-01 NOTE — Evaluation (Signed)
Occupational Therapy Evaluation Patient Details Name: Johnny [name] MRN: [id number] DOB: May 20, 1945 Today's Date: 02/01/2013 Time: 3086-5784 OT Time Calculation (min): 29 min  OT Assessment / Plan / Recommendation History of present illness Pt is s/p R THR   Clinical Impression   Pt doing well but needs min assist to steady with dynamic tasks such as pulling up clothing or use of urinal. Wife present and will be assisting PRN at d/c. She states they have a 3in1. Appears pt likely d/c later today.    OT Assessment  Patient needs continued OT Services    Follow Up Recommendations  No OT follow up;Supervision/Assistance - 24 hour    Barriers to Discharge      Equipment Recommendations  None recommended by OT    Recommendations for Other Services    Frequency  Min 2X/week    Precautions / Restrictions Precautions Precautions: Posterior Hip Precaution Booklet Issued: Yes (comment) Precaution Comments: Pt recalls 2/3 THP without cues Restrictions Weight Bearing Restrictions: No Other Position/Activity Restrictions: WBAT   Pertinent Vitals/Pain 4/10 R hip; reposition, ice    ADL  Eating/Feeding: Independent Where Assessed - Eating/Feeding: Chair Grooming: Wash/dry hands;Set up Where Assessed - Grooming: Supported sitting Upper Body Bathing: Chest;Right arm;Left arm;Abdomen;Set up Where Assessed - Upper Body Bathing: Unsupported sitting Lower Body Bathing: Moderate assistance (without AE) Where Assessed - Lower Body Bathing: Supported sit to stand Upper Body Dressing: Set up Where Assessed - Upper Body Dressing: Unsupported sitting Lower Body Dressing: Moderate assistance (with reacher to don underwear and pj pants) Where Assessed - Lower Body Dressing: Supported sit to stand Toilet Transfer: Minimal assistance Acupuncturist: Raised toilet seat with arms (or 3-in-1 over toilet) Toileting - Clothing Manipulation and Hygiene: Minimal assistance Where Assessed  - Engineer, mining and Hygiene: Standing Equipment Used: Reacher;Rolling walker ADL Comments: Pt's wife present for session and states she will be helping with LB self care. He does have a reacher he can use if desired so practiced donning pants/underwear in case he decides to use reacher. Helped with socks and wife can help with socks/shoes at home. Pt slightly unsteady needing min assist to balance with tasks such as pulling up pants or using urinal in standing. Educated wife on how to stand to support pt in standing. Recommended pt sponge bathe initially until stronger and more balanced in standing. Pt and wife verbalized understanding. Pt able to state 3/3 precautions.     OT Diagnosis: Generalized weakness  OT Problem List: Decreased strength;Decreased knowledge of use of DME or AE;Decreased knowledge of precautions OT Treatment Interventions: Self-care/ADL training;DME and/or AE instruction;Therapeutic activities;Patient/family education   OT Goals(Current goals can be found in the care plan section) Acute Rehab OT Goals Patient Stated Goal: decrease pain OT Goal Formulation: With patient/family Time For Goal Achievement: 02/08/13 Potential to Achieve Goals: Good  Visit Information  Last OT Received On: 02/01/13 Assistance Needed: +1 History of Present Illness: Pt is s/p R THR       Prior Functioning     Home Living Family/patient expects to be discharged to:: Private residence Living Arrangements: Spouse/significant other Available Help at Discharge: Family Type of Home: House Home Access: Stairs to enter Secretary/administrator of Steps: 3 Entrance Stairs-Rails: Right Home Layout: Able to live on main level with bedroom/bathroom Home Equipment: Walker - 2 wheels;Bedside commode;Adaptive equipment Adaptive Equipment: Reacher Prior Function Level of Independence: Independent;Independent with assistive device(s) Communication Communication: No  difficulties Dominant Hand: Right  Vision/Perception     Cognition  Cognition Arousal/Alertness: Awake/alert Behavior During Therapy: WFL for tasks assessed/performed Overall Cognitive Status: Within Functional Limits for tasks assessed    Extremity/Trunk Assessment Upper Extremity Assessment Upper Extremity Assessment: Overall WFL for tasks assessed     Mobility Bed Mobility Supine to Sit: 4: Min guard;HOB flat Details for Bed Mobility Assistance: verbal cues for technique with precautions. Transfers Transfers: Sit to Stand;Stand to Sit Sit to Stand: 4: Min assist;With upper extremity assist;From chair/3-in-1 Stand to Sit: 4: Min guard;With upper extremity assist;To chair/3-in-1 Details for Transfer Assistance: min verbal cues for technique with THPs and hand placement and min assist to steady as he rises.     Exercise     Balance Balance Balance Assessed: Yes Dynamic Standing Balance Dynamic Standing - Level of Assistance: 4: Min assist   End of Session OT - End of Session Equipment Utilized During Treatment: Rolling walker Activity Tolerance: Patient tolerated treatment well Patient left: in chair;with call bell/phone within reach;with family/visitor present  GO     Lennox Laity [phone] 02/01/2013, 9:39 AM

## 2013-02-01 NOTE — Progress Notes (Signed)
Physical Therapy Treatment Patient Details Name: Johnny [name] MRN: [id number] DOB: March 28, 1945 Today's Date: 02/01/2013 Time: 8469-6295 PT Time Calculation (min): 39 min  PT Assessment / Plan / Recommendation  History of Present Illness Pt is s/p R THR   PT Comments    Reviewed car transfers and stairs with pt and spouse  Follow Up Recommendations  Home health PT     Does the patient have the potential to tolerate intense rehabilitation     Barriers to Discharge        Equipment Recommendations  None recommended by PT    Recommendations for Other Services OT consult  Frequency 7X/week   Progress towards PT Goals Progress towards PT goals: Progressing toward goals  Plan Current plan remains appropriate    Precautions / Restrictions Precautions Precautions: Posterior Hip Precaution Booklet Issued: Yes (comment) Precaution Comments: Pt recalls 3/3 THP without cues Restrictions Weight Bearing Restrictions: No Other Position/Activity Restrictions: WBAT   Pertinent Vitals/Pain 4/10; premed, ice pack provided    Mobility  Bed Mobility Bed Mobility: Not assessed (Pt reports min difficulty with OT this am) Supine to Sit: 4: Min guard;HOB flat Details for Bed Mobility Assistance: verbal cues for technique with precautions. Transfers Transfers: Sit to Stand;Stand to Sit Sit to Stand: 5: Supervision Stand to Sit: 5: Supervision Details for Transfer Assistance: min verbal cues for technique with THPs and hand placement  Ambulation/Gait Ambulation/Gait Assistance: 4: Min guard;5: Supervision Ambulation Distance (Feet): 80 Feet (twice) Assistive device: Rolling walker Ambulation/Gait Assistance Details: cues for posture, position from RW and increased ER on R Gait Pattern: Step-to pattern;Decreased step length - right;Decreased step length - left;Shuffle;Antalgic Stairs: Yes Stairs Assistance: 4: Min assist Stairs Assistance Details (indicate cue type and reason): cues for  sequence and foot/crutch placement Stair Management Technique: One rail Left;Forwards;With crutches;Step to pattern Number of Stairs: 4    Exercises Total Joint Exercises Ankle Circles/Pumps: AROM;10 reps;Supine;Both   PT Diagnosis:    PT Problem List:   PT Treatment Interventions:     PT Goals (current goals can now be found in the care plan section) Acute Rehab PT Goals Patient Stated Goal: decrease pain PT Goal Formulation: With patient Time For Goal Achievement: 02/07/13 Potential to Achieve Goals: Good  Visit Information  Last PT Received On: 02/01/13 Assistance Needed: +1 History of Present Illness: Pt is s/p R THR    Subjective Data  Patient Stated Goal: decrease pain   Cognition  Cognition Arousal/Alertness: Awake/alert Behavior During Therapy: WFL for tasks assessed/performed Overall Cognitive Status: Within Functional Limits for tasks assessed    Balance  Balance Balance Assessed: Yes Dynamic Standing Balance Dynamic Standing - Level of Assistance: 4: Min assist  End of Session PT - End of Session Equipment Utilized During Treatment: Gait belt Activity Tolerance: Patient tolerated treatment well Patient left: in chair;with call bell/phone within reach;with family/visitor present Nurse Communication: Mobility status   GP     Johnny [name] 02/01/2013, 12:14 PM

## 2013-02-07 NOTE — Discharge Summary (Signed)
Physician Discharge Summary   Patient ID: Johnny [name] MRN: [id number] DOB/AGE: Sep 13, 1945 67 y.o.  Admit date: 01/30/2013 Discharge date: 02/01/2013  Primary Diagnosis:  Osteoarthritis Right hip  Admission Diagnoses:  Past Medical History  Diagnosis Date  . Benign prostatic hypertrophy   . Arthritis   . Benign positional vertigo   . Chronic ischemic heart disease, unspecified   . Family history of ischemic heart disease   . Hyperlipidemia   . Esophageal reflux   . CAD (coronary artery disease)   . Observation for suspected malignant neoplasm   . Routine general medical examination at a health care facility   . Balanitis   . HTN (hypertension)   . ED (erectile dysfunction)   . Type II or unspecified type diabetes mellitus without mention of complication, not stated as uncontrolled 01-13-11    oral meds only   Discharge Diagnoses:   Principal Problem:   OA (osteoarthritis) of hip  Estimated body mass index is 35.16 kg/(m^2) as calculated from the following:   Height as of this encounter: 5\' 11"  (1.803 m).   Weight as of this encounter: 114.306 kg (252 lb).  Procedure(s) (LRB): RIGHT TOTAL HIP ARTHROPLASTY (Right)   Consults: None  HPI: Johnny [name] is a 67 y.o. male with end stage arthritis of his right hip with progressively worsening pain and dysfunction. Pain occurs with activity and rest including pain at night. He has tried analgesics, protected weight bearing and rest without benefit. Pain is too severe to attempt physical therapy. Radiographs demonstrate bone on bone arthritis with subchondral cyst formation. He presents now for right THA.  Laboratory Data: Admission on 01/30/2013, Discharged on 02/01/2013  Component Date Value Range Status  . ABO/RH(D) 01/30/2013 O POS   Final  . Antibody Screen 01/30/2013 NEG   Final  . Sample Expiration 01/30/2013 02/02/2013   Final  . Glucose-Capillary 01/30/2013 131* 70 - 99 mg/dL Final  . Comment 1 56/21/3086  Documented in Chart   Final  . Comment 2 01/30/2013 Notify RN   Final  . Glucose-Capillary 01/30/2013 122* 70 - 99 mg/dL Final  . WBC 57/84/6962 6.3  4.0 - 10.5 K/uL Final  . RBC 01/31/2013 3.83* 4.22 - 5.81 MIL/uL Final  . Hemoglobin 01/31/2013 10.3* 13.0 - 17.0 g/dL Final  . HCT 95/28/4132 31.9* 39.0 - 52.0 % Final  . MCV 01/31/2013 83.3  78.0 - 100.0 fL Final  . MCH 01/31/2013 26.9  26.0 - 34.0 pg Final  . MCHC 01/31/2013 32.3  30.0 - 36.0 g/dL Final  . RDW 44/03/270 15.0  11.5 - 15.5 % Final  . Platelets 01/31/2013 219  150 - 400 K/uL Final  . Sodium 01/31/2013 135  135 - 145 mEq/L Final  . Potassium 01/31/2013 4.2  3.5 - 5.1 mEq/L Final  . Chloride 01/31/2013 101  96 - 112 mEq/L Final  . CO2 01/31/2013 25  19 - 32 mEq/L Final  . Glucose, Bld 01/31/2013 131* 70 - 99 mg/dL Final  . BUN 53/66/4403 8  6 - 23 mg/dL Final  . Creatinine, Ser 01/31/2013 0.82  0.50 - 1.35 mg/dL Final  . Calcium 47/42/5956 8.4  8.4 - 10.5 mg/dL Final  . GFR calc non Af Amer 01/31/2013 89* >90 mL/min Final  . GFR calc Af Amer 01/31/2013 >90  >90 mL/min Final   Comment: (NOTE)  The eGFR has been calculated using the CKD EPI equation.                          This calculation has not been validated in all clinical situations.                          eGFR's persistently <90 mL/min signify possible Chronic Kidney                          Disease.  . Glucose-Capillary 01/31/2013 154* 70 - 99 mg/dL Final  . Glucose-Capillary 01/31/2013 146* 70 - 99 mg/dL Final  . Glucose-Capillary 01/31/2013 149* 70 - 99 mg/dL Final  . Comment 1 91/47/8295 Notify RN   Final  . Comment 2 01/31/2013 Documented in Chart   Final  . WBC 02/01/2013 9.7  4.0 - 10.5 K/uL Final  . RBC 02/01/2013 4.01* 4.22 - 5.81 MIL/uL Final  . Hemoglobin 02/01/2013 11.1* 13.0 - 17.0 g/dL Final  . HCT 62/13/0865 33.2* 39.0 - 52.0 % Final  . MCV 02/01/2013 82.8  78.0 - 100.0 fL Final  . MCH 02/01/2013 27.7  26.0 - 34.0 pg  Final  . MCHC 02/01/2013 33.4  30.0 - 36.0 g/dL Final  . RDW 78/46/9629 14.8  11.5 - 15.5 % Final  . Platelets 02/01/2013 244  150 - 400 K/uL Final  . Sodium 02/01/2013 136  135 - 145 mEq/L Final  . Potassium 02/01/2013 4.3  3.5 - 5.1 mEq/L Final  . Chloride 02/01/2013 100  96 - 112 mEq/L Final  . CO2 02/01/2013 23  19 - 32 mEq/L Final  . Glucose, Bld 02/01/2013 200* 70 - 99 mg/dL Final  . BUN 52/84/1324 10  6 - 23 mg/dL Final  . Creatinine, Ser 02/01/2013 0.79  0.50 - 1.35 mg/dL Final  . Calcium 40/12/2723 9.2  8.4 - 10.5 mg/dL Final  . GFR calc non Af Amer 02/01/2013 >90  >90 mL/min Final  . GFR calc Af Amer 02/01/2013 >90  >90 mL/min Final   Comment: (NOTE)                          The eGFR has been calculated using the CKD EPI equation.                          This calculation has not been validated in all clinical situations.                          eGFR's persistently <90 mL/min signify possible Chronic Kidney                          Disease.  . Glucose-Capillary 02/01/2013 175* 70 - 99 mg/dL Final  Hospital Outpatient Visit on 01/21/2013  Component Date Value Range Status  . aPTT 01/21/2013 33  24 - 37 seconds Final  . WBC 01/21/2013 6.4  4.0 - 10.5 K/uL Final  . RBC 01/21/2013 4.26  4.22 - 5.81 MIL/uL Final  . Hemoglobin 01/21/2013 11.6* 13.0 - 17.0 g/dL Final  . HCT 36/64/4034 35.6* 39.0 - 52.0 % Final  . MCV 01/21/2013 83.6  78.0 - 100.0 fL Final  . MCH 01/21/2013 27.2  26.0 - 34.0 pg Final  . MCHC 01/21/2013  32.6  30.0 - 36.0 g/dL Final  . RDW 11/91/4782 15.1  11.5 - 15.5 % Final  . Platelets 01/21/2013 267  150 - 400 K/uL Final  . Sodium 01/21/2013 138  135 - 145 mEq/L Final  . Potassium 01/21/2013 3.9  3.5 - 5.1 mEq/L Final  . Chloride 01/21/2013 103  96 - 112 mEq/L Final  . CO2 01/21/2013 26  19 - 32 mEq/L Final  . Glucose, Bld 01/21/2013 167* 70 - 99 mg/dL Final  . BUN 95/62/1308 9  6 - 23 mg/dL Final  . Creatinine, Ser 01/21/2013 0.87  0.50 - 1.35 mg/dL Final    . Calcium 65/78/4696 9.5  8.4 - 10.5 mg/dL Final  . Total Protein 01/21/2013 7.2  6.0 - 8.3 g/dL Final  . Albumin 29/52/8413 3.7  3.5 - 5.2 g/dL Final  . AST 24/40/1027 13  0 - 37 U/L Final  . ALT 01/21/2013 13  0 - 53 U/L Final  . Alkaline Phosphatase 01/21/2013 78  39 - 117 U/L Final  . Total Bilirubin 01/21/2013 0.3  0.3 - 1.2 mg/dL Final  . GFR calc non Af Amer 01/21/2013 87* >90 mL/min Final  . GFR calc Af Amer 01/21/2013 >90  >90 mL/min Final   Comment: (NOTE)                          The eGFR has been calculated using the CKD EPI equation.                          This calculation has not been validated in all clinical situations.                          eGFR's persistently <90 mL/min signify possible Chronic Kidney                          Disease.  [name] Prothrombin Time 01/21/2013 14.5  11.6 - 15.2 seconds Final  . INR 01/21/2013 1.15  0.00 - 1.49 Final  . Color, Urine 01/21/2013 YELLOW  YELLOW Final  . APPearance 01/21/2013 CLEAR  CLEAR Final  . Specific Gravity, Urine 01/21/2013 1.012  1.005 - 1.030 Final  . pH 01/21/2013 7.5  5.0 - 8.0 Final  . Glucose, UA 01/21/2013 NEGATIVE  NEGATIVE mg/dL Final  . Hgb urine dipstick 01/21/2013 NEGATIVE  NEGATIVE Final  . Bilirubin Urine 01/21/2013 NEGATIVE  NEGATIVE Final  . Ketones, ur 01/21/2013 NEGATIVE  NEGATIVE mg/dL Final  . Protein, ur 25/36/6440 NEGATIVE  NEGATIVE mg/dL Final  . Urobilinogen, UA 01/21/2013 1.0  0.0 - 1.0 mg/dL Final  . Nitrite 34/74/2595 NEGATIVE  NEGATIVE Final  . Leukocytes, UA 01/21/2013 NEGATIVE  NEGATIVE Final   MICROSCOPIC NOT DONE ON URINES WITH NEGATIVE PROTEIN, BLOOD, LEUKOCYTES, NITRITE, OR GLUCOSE <1000 mg/dL.  [name] MRSA, PCR 01/21/2013 NEGATIVE  NEGATIVE Final  . Staphylococcus aureus 01/21/2013 NEGATIVE  NEGATIVE Final   Comment:                                 The Xpert SA Assay (FDA                          approved for NASAL specimens  in patients over 50 years of age),                           is one component of                          a comprehensive surveillance                          program.  Test performance has                          been validated by [employer] for patients greater                          than or equal to 45 year old.                          It is not intended                          to diagnose infection nor to                          guide or monitor treatment.     X-Rays:Dg Chest 2 View  01/21/2013   CLINICAL DATA:  Preop for hip replacement.  EXAM: CHEST  2 VIEW  COMPARISON:  01/13/2011  FINDINGS: Two views of the chest were obtained. Patient has median sternotomy wires. Heart and mediastinum are stable. Slightly prominent lung markings without focal disease or pulmonary edema. Bony thorax intact. Multilevel degenerative changes in the lower thoracic spine.  IMPRESSION: No active cardiopulmonary disease.   Electronically Signed   By: Richarda Overlie M.D.   On: 01/21/2013 16:23   Dg Hip Complete Right  01/21/2013   CLINICAL DATA:  Preop for right hip replacement.  EXAM: RIGHT HIP - COMPLETE 2+ VIEW  COMPARISON:  01/24/2011  FINDINGS: Patient has a left hip arthroplasty that appears to be grossly unchanged. Probable penile implants. Again noted is severe joint space narrowing in the right hip with subchondral sclerosis. Large lucency involving the superior right femoral head could represent a large subchondral cyst. Pelvic bony ring is intact. Right hip is located. 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  IMPRESSION: Severe osteoarthritis of the right hip.  No acute bone abnormality.   Electronically Signed   By: Richarda Overlie M.D.   On: 01/21/2013 16:19   Dg Pelvis Portable  01/30/2013   CLINICAL DATA:  Postoperative radiographs following right total hip arthroplasty.  EXAM: PORTABLE PELVIS 1-2 VIEWS  COMPARISON:  01/24/2011.  FINDINGS: There is a new right total hip arthroplasty. No complicating  features are identified. On this frontal view, the new right hip arthroplasty appears located. Old left hip arthroplasty is noted. Penile prosthesis. Expected postsurgical changes in the right hip soft tissues.  IMPRESSION: Uncomplicated new right total hip arthroplasty.   Electronically Signed   By: Andreas Newport M.D.   On: 01/30/2013 14:35    EKG: Orders placed during the hospital encounter of 01/30/13  . EKG  Hospital Course: Patient was admitted to [facility] and taken to the OR and underwent the above state procedure without complications.  Patient tolerated the procedure well and was later transferred to the recovery room and then to the orthopaedic floor for postoperative care.  They were given PO and IV analgesics for pain control following their surgery.  They were given 24 hours of postoperative antibiotics of  Anti-infectives   Start     Dose/Rate Route Frequency Ordered Stop   01/30/13 1800  ceFAZolin (ANCEF) IVPB 2 g/50 mL premix     2 g 100 mL/hr over 30 Minutes Intravenous Every 6 hours 01/30/13 1522 01/31/13 0031   01/30/13 0930  ceFAZolin (ANCEF) IVPB 2 g/50 mL premix     2 g 100 mL/hr over 30 Minutes Intravenous On call to O.R. 01/30/13 4540 01/30/13 1245     and started on DVT prophylaxis in the form of Xarelto.   PT and OT were ordered for total hip protocol.  The patient was allowed to be WBAT with therapy. Discharge planning was consulted to help with postop disposition and equipment needs.  Patient had a good night on the evening of surgery.  They started to get up OOB with therapy on day one.  Hemovac drain was pulled without difficulty.  The knee immobilizer was removed and discontinued.  Continued to work with therapy into day two.  Dressing was changed on day two and the incision was healing well.   Patient was seen in rounds and was ready to go home.   Discharge Medications: Prior to Admission medications   Medication Sig Start Date End Date Taking?  Authorizing Provider  doxazosin (CARDURA) 8 MG tablet Take 1 tablet (8 mg total) by mouth at bedtime. 01/24/13  Yes Georgina Quint Plotnikov, MD  enalapril (VASOTEC) 10 MG tablet Take 1 tablet (10 mg total) by mouth daily with breakfast. 01/24/13  Yes Georgina Quint Plotnikov, MD  glimepiride (AMARYL) 4 MG tablet Take 1 tablet (4 mg total) by mouth daily. 01/24/13  Yes Georgina Quint Plotnikov, MD  lovastatin (MEVACOR) 20 MG tablet Take 1 tablet (20 mg total) by mouth daily. 01/24/13  Yes Tresa Garter, MD  metFORMIN (GLUCOPHAGE) 1000 MG tablet Take 1 tablet (1,000 mg total) by mouth 2 (two) times daily with a meal. 01/24/13  Yes Georgina Quint Plotnikov, MD  pantoprazole (PROTONIX) 40 MG tablet Take 1 tablet (40 mg total) by mouth daily. 01/24/13 01/24/14 Yes Georgina Quint Plotnikov, MD  meclizine (ANTIVERT) 12.5 MG tablet Take 12.5-25 mg by mouth 3 (three) times daily as needed for dizziness or nausea. 02/07/12   Georgina Quint Plotnikov, MD  methocarbamol (ROBAXIN) 500 MG tablet Take 1 tablet (500 mg total) by mouth every 6 (six) hours as needed for muscle spasms. 01/31/13   Loanne Drilling, MD  nitroGLYCERIN (NITROSTAT) 0.4 MG SL tablet Place 1 tablet (0.4 mg total) under the tongue every 5 (five) minutes as needed for chest pain. 01/24/13   Tresa Garter, MD  rivaroxaban (XARELTO) 10 MG TABS tablet Take 1 tablet (10 mg total) by mouth daily with breakfast. 01/31/13   Loanne Drilling, MD  traMADol (ULTRAM) 50 MG tablet Take 1-2 tablets (50-100 mg total) by mouth every 6 (six) hours as needed for moderate pain. 01/31/13   Loanne Drilling, MD     Activity:WBAT No bending hip over 90 degrees- A "L" Angle Do not cross legs Do not let foot roll inward When turning these patients  a pillow should be placed between the patient's legs to prevent crossing. Patients should have the affected knee fully extended when trying to sit or stand from all surfaces to prevent excessive hip flexion. When ambulating and turning  toward the affected side the affected leg should have the toes turned out prior to moving the walker and the rest of patient's body as to prevent internal rotation/ turning in of the leg. Abduction pillows are the most effective way to prevent a patient from not crossing legs or turning toes in at rest. If an abduction pillow is not ordered placing a regular pillow length wise between the patient's legs is also an effective reminder. It is imperative that these precautions be maintained so that the surgical hip does not dislocate. Follow-up:in 2 weeks Disposition - Home Discharged Condition: good    Future Appointments Provider Department Dept Phone   03/04/2013 1:30 PM Tresa Garter, MD [facility] [place] [phone]   04/30/2013 9:00 AM Marinus Maw, MD [facility] [place] Office [phone]       Medication List    STOP taking these medications       aspirin 325 MG tablet     glucose blood test strip  Commonly known as:  ONETOUCH VERIO IQ      TAKE these medications       doxazosin 8 MG tablet  Commonly known as:  CARDURA  Take 1 tablet (8 mg total) by mouth at bedtime.     enalapril 10 MG tablet  Commonly known as:  VASOTEC  Take 1 tablet (10 mg total) by mouth daily with breakfast.     glimepiride 4 MG tablet  Commonly known as:  AMARYL  Take 1 tablet (4 mg total) by mouth daily.     lovastatin 20 MG tablet  Commonly known as:  MEVACOR  Take 1 tablet (20 mg total) by mouth daily.     meclizine 12.5 MG tablet  Commonly known as:  ANTIVERT  Take 12.5-25 mg by mouth 3 (three) times daily as needed for dizziness or nausea.     metFORMIN 1000 MG tablet  Commonly known as:  GLUCOPHAGE  Take 1 tablet (1,000 mg total) by mouth 2 (two) times daily with a meal.     methocarbamol 500 MG tablet  Commonly known as:  ROBAXIN  Take 1 tablet (500 mg total) by mouth every 6 (six) hours as needed for muscle spasms.     nitroGLYCERIN 0.4 MG SL  tablet  Commonly known as:  NITROSTAT  Place 1 tablet (0.4 mg total) under the tongue every 5 (five) minutes as needed for chest pain.     pantoprazole 40 MG tablet  Commonly known as:  PROTONIX  Take 1 tablet (40 mg total) by mouth daily.     rivaroxaban 10 MG Tabs tablet  Commonly known as:  XARELTO  Take 1 tablet (10 mg total) by mouth daily with breakfast.     traMADol 50 MG tablet  Commonly known as:  ULTRAM  Take 1-2 tablets (50-100 mg total) by mouth every 6 (six) hours as needed for moderate pain.           Follow-up Information   Follow up with Loanne Drilling, MD. Schedule an appointment as soon as possible for a visit on 02/14/2013. (Call [phone] Monday to make the appointment)    Specialty:  Orthopedic Surgery   Contact information:   [address] [place] [address]  [phone]       Signed: Patrica Duel 02/07/2013, 2:49 PM

## 2013-02-26 ENCOUNTER — Telehealth: Payer: Self-pay | Admitting: *Deleted

## 2013-02-26 NOTE — Telephone Encounter (Signed)
Pt's wife left vm requesting jury duty excuse letter due to his recent hip surgery. Please advise.

## 2013-03-01 NOTE — Telephone Encounter (Signed)
Ok Pls print Thx

## 2013-03-04 ENCOUNTER — Encounter: Payer: Self-pay | Admitting: Internal Medicine

## 2013-03-04 ENCOUNTER — Ambulatory Visit (INDEPENDENT_AMBULATORY_CARE_PROVIDER_SITE_OTHER): Payer: Medicare Other | Admitting: Internal Medicine

## 2013-03-04 ENCOUNTER — Other Ambulatory Visit (INDEPENDENT_AMBULATORY_CARE_PROVIDER_SITE_OTHER): Payer: Medicare Other

## 2013-03-04 VITALS — BP 148/70 | HR 76 | Temp 98.3°F | Resp 16 | Wt 242.0 lb

## 2013-03-04 DIAGNOSIS — M169 Osteoarthritis of hip, unspecified: Secondary | ICD-10-CM

## 2013-03-04 DIAGNOSIS — I10 Essential (primary) hypertension: Secondary | ICD-10-CM

## 2013-03-04 DIAGNOSIS — M1611 Unilateral primary osteoarthritis, right hip: Secondary | ICD-10-CM

## 2013-03-04 DIAGNOSIS — I2510 Atherosclerotic heart disease of native coronary artery without angina pectoris: Secondary | ICD-10-CM

## 2013-03-04 DIAGNOSIS — N320 Bladder-neck obstruction: Secondary | ICD-10-CM

## 2013-03-04 DIAGNOSIS — E119 Type 2 diabetes mellitus without complications: Secondary | ICD-10-CM

## 2013-03-04 LAB — HEMOGLOBIN A1C: Hgb A1c MFr Bld: 6.8 % — ABNORMAL HIGH (ref 4.6–6.5)

## 2013-03-04 LAB — BASIC METABOLIC PANEL
CO2: 25 mEq/L (ref 19–32)
Chloride: 103 mEq/L (ref 96–112)
Creatinine, Ser: 0.9 mg/dL (ref 0.4–1.5)
GFR: 107.8 mL/min (ref >60.00)
Potassium: 3.8 mEq/L (ref 3.5–5.1)
Sodium: 139 mEq/L (ref 135–145)

## 2013-03-04 MED ORDER — FINASTERIDE 5 MG PO TABS: 5.0000 mg | ORAL_TABLET | Freq: Every day | ORAL | Status: DC

## 2013-03-04 NOTE — Assessment & Plan Note (Signed)
Continue with current prescription therapy as reflected on the Med list.  

## 2013-03-04 NOTE — Assessment & Plan Note (Signed)
Continue with current prescription therapy as reflected on the Med list. Cut back on juices

## 2013-03-04 NOTE — Telephone Encounter (Signed)
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

## 2013-03-04 NOTE — Assessment & Plan Note (Signed)
R THR 11/14

## 2013-03-04 NOTE — Telephone Encounter (Signed)
Pt was given letter from Dr. Lequita Halt. Closing phone note.

## 2013-03-04 NOTE — Progress Notes (Signed)
   Subjective:    HPI  The patient presents for a follow-up of  chronic hypertension, chronic dyslipidemia, type 2 diabetes controlled with medicines. C/o ED, penile pump was fixed S/p R THR - 01/30/13    Wt Readings from Last 3 Encounters:  03/04/13 242 lb (109.77 kg)  01/30/13 252 lb (114.306 kg)  01/30/13 252 lb (114.306 kg)   BP Readings from Last 3 Encounters:  03/04/13 148/70  02/01/13 150/70  02/01/13 150/70      Review of Systems  Constitutional: Negative for appetite change, fatigue and unexpected weight change.  HENT: Negative for congestion, nosebleeds, sneezing and sore throat.   Eyes: Negative for itching and visual disturbance.  Respiratory: Negative for cough and shortness of breath.   Cardiovascular: Negative for palpitations and leg swelling.  Gastrointestinal: Negative for nausea, diarrhea, blood in stool and abdominal distention.  Genitourinary: Negative for frequency and hematuria.  Musculoskeletal: Negative for back pain, gait problem and joint swelling.  Skin: Negative for rash.  Neurological: Negative for dizziness, tremors and speech difficulty.  Psychiatric/Behavioral: Negative for sleep disturbance, dysphoric mood and agitation. The patient is not nervous/anxious.        Objective:   Physical Exam  Constitutional: He is oriented to person, place, and time. He appears well-developed.  Obese   HENT:  Mouth/Throat: Oropharynx is clear and moist.  Eyes: Conjunctivae are normal. Pupils are equal, round, and reactive to light.  Neck: Normal range of motion. No JVD present. No thyromegaly present.  Cardiovascular: Normal rate, regular rhythm, normal heart sounds and intact distal pulses.  Exam reveals no gallop and no friction rub.   No murmur heard. Pulmonary/Chest: Effort normal and breath sounds normal. No respiratory distress. He has no wheezes. He has no rales. He exhibits no tenderness.  Abdominal: Soft. Bowel sounds are normal. He exhibits  no distension and no mass. There is no tenderness. There is no rebound and no guarding.  Musculoskeletal: Normal range of motion. He exhibits no edema and no tenderness.  Lymphadenopathy:    He has no cervical adenopathy.  Neurological: He is alert and oriented to person, place, and time. He has normal reflexes. No cranial nerve deficit. He exhibits normal muscle tone. Coordination normal.  Skin: Skin is warm and dry. No rash noted.  Psychiatric: He has a normal mood and affect. His behavior is normal. Judgment and thought content normal.  R hip is not tender; Cane  Lab Results  Component Value Date   WBC 9.7 02/01/2013   HGB 11.1* 02/01/2013   HCT 33.2* 02/01/2013   PLT 244 02/01/2013   GLUCOSE 200* 02/01/2013   CHOL 136 07/14/2011   TRIG 70.0 07/14/2011   HDL 32.50* 07/14/2011   LDLDIRECT 78.1 09/16/2008   LDLCALC 90 07/14/2011   ALT 13 01/21/2013   AST 13 01/21/2013   NA 136 02/01/2013   K 4.3 02/01/2013   CL 100 02/01/2013   CREATININE 0.79 02/01/2013   BUN 10 02/01/2013   CO2 23 02/01/2013   TSH 3.59 10/14/2010   PSA 1.28 10/24/2008   INR 1.15 01/21/2013   HGBA1C 7.6* 10/19/2012        Assessment & Plan:

## 2013-03-04 NOTE — Progress Notes (Signed)
Pre visit review using our clinic review tool, if applicable. No additional management support is needed unless otherwise documented below in the visit note. 

## 2013-03-04 NOTE — Assessment & Plan Note (Signed)
Will try Proscar 

## 2013-03-27 ENCOUNTER — Emergency Department (HOSPITAL_COMMUNITY)
Admission: EM | Admit: 2013-03-27 | Discharge: 2013-03-27 | Disposition: A | Discharge status: Home / Self Care | Payer: Medicare Other | Attending: Emergency Medicine | Admitting: Emergency Medicine

## 2013-03-27 ENCOUNTER — Telehealth: Payer: Self-pay | Admitting: Internal Medicine

## 2013-03-27 ENCOUNTER — Encounter (HOSPITAL_COMMUNITY): Payer: Self-pay | Admitting: Emergency Medicine

## 2013-03-27 DIAGNOSIS — R339 Retention of urine, unspecified: Secondary | ICD-10-CM | POA: Insufficient documentation

## 2013-03-27 DIAGNOSIS — I10 Essential (primary) hypertension: Secondary | ICD-10-CM | POA: Insufficient documentation

## 2013-03-27 DIAGNOSIS — K219 Gastro-esophageal reflux disease without esophagitis: Secondary | ICD-10-CM | POA: Insufficient documentation

## 2013-03-27 DIAGNOSIS — I2510 Atherosclerotic heart disease of native coronary artery without angina pectoris: Secondary | ICD-10-CM | POA: Insufficient documentation

## 2013-03-27 DIAGNOSIS — Z87891 Personal history of nicotine dependence: Secondary | ICD-10-CM | POA: Insufficient documentation

## 2013-03-27 DIAGNOSIS — Z79899 Other long term (current) drug therapy: Secondary | ICD-10-CM | POA: Insufficient documentation

## 2013-03-27 DIAGNOSIS — Z8739 Personal history of other diseases of the musculoskeletal system and connective tissue: Secondary | ICD-10-CM | POA: Insufficient documentation

## 2013-03-27 DIAGNOSIS — Z7901 Long term (current) use of anticoagulants: Secondary | ICD-10-CM | POA: Insufficient documentation

## 2013-03-27 DIAGNOSIS — N400 Enlarged prostate without lower urinary tract symptoms: Secondary | ICD-10-CM | POA: Insufficient documentation

## 2013-03-27 DIAGNOSIS — E119 Type 2 diabetes mellitus without complications: Secondary | ICD-10-CM | POA: Insufficient documentation

## 2013-03-27 DIAGNOSIS — Z951 Presence of aortocoronary bypass graft: Secondary | ICD-10-CM | POA: Insufficient documentation

## 2013-03-27 DIAGNOSIS — E785 Hyperlipidemia, unspecified: Secondary | ICD-10-CM | POA: Insufficient documentation

## 2013-03-27 DIAGNOSIS — Z8669 Personal history of other diseases of the nervous system and sense organs: Secondary | ICD-10-CM | POA: Insufficient documentation

## 2013-03-27 LAB — URINALYSIS W MICROSCOPIC + REFLEX CULTURE
BILIRUBIN URINE: NEGATIVE
Glucose, UA: NEGATIVE mg/dL
HGB URINE DIPSTICK: NEGATIVE
KETONES UR: NEGATIVE mg/dL
Leukocytes, UA: NEGATIVE
NITRITE: NEGATIVE
PROTEIN: NEGATIVE mg/dL
Specific Gravity, Urine: 1.009 (ref 1.005–1.030)
Urobilinogen, UA: 0.2 mg/dL (ref 0.0–1.0)
pH: 5.5 (ref 5.0–8.0)

## 2013-03-27 NOTE — ED Notes (Signed)
Pt is feeling better, no distress.  Additional 142ml urine output. Pt had total of 1157ml urine out.

## 2013-03-27 NOTE — ED Provider Notes (Signed)
CSN: [id number]     Arrival date & time 03/27/13  3154 History   First MD Initiated Contact with Patient 03/27/13 [phone]     Chief Complaint  Patient presents with  . Urinary Retention    HPI Pt was seen at 0900. Per pt and his wife, c/o gradual onset and persistence of constant urinary "dribbling" for the past 3 to 4 weeks, worse since this morning. Pt states he was evaluated by his PMD 3 weeks ago for same, rx Proscar for a presumed enlarged prostate. Pt states this morning he "just stopped passing urine." States he was offered a PMD appt this morning, but came to the ED for eval instead. Denies dysuria/hematuria, no testicular pain/swelling, no back/flank pain, no abd pain, no N/V/D, no fevers, no rash.     Uro: Dr. Karsten Ro  Past Medical History  Diagnosis Date  . Benign prostatic hypertrophy   . Arthritis   . Benign positional vertigo   . Chronic ischemic heart disease, unspecified   . Family history of ischemic heart disease   . Hyperlipidemia   . Esophageal reflux   . CAD (coronary artery disease)   . Observation for suspected malignant neoplasm   . Routine general medical examination at a health care facility   . Balanitis   . HTN (hypertension)   . ED (erectile dysfunction)   . Type II or unspecified type diabetes mellitus without mention of complication, not stated as uncontrolled 01-13-11    oral meds only   Past Surgical History  Procedure Laterality Date  . Penile prosthesis implant  01-13-11    02-08-10  . 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 Cataract extraction  01-13-11    right eye only  . Total hip arthroplasty  01/24/2011    Procedure: TOTAL HIP ARTHROPLASTY;  Surgeon: Dione Plover [name];  Location: [facility];  Service: Orthopedics;  Laterality: Left;  . Joint replacement      12/12 L THR  . Total hip arthroplasty Right 01/30/2013    Procedure: RIGHT TOTAL HIP ARTHROPLASTY;  Surgeon: Gearlean Alf, MD;  Location: [facility];  Service:  Orthopedics;  Laterality: Right;   Family History  Problem Relation Age of Onset  . Coronary artery disease Other     1st degree male relative  . Hypertension Other   . Hypertension Mother   . Diabetes Father    History  Substance Use Topics  . Smoking status: Former Research scientist (life sciences)  . Smokeless tobacco: Not on file  . Alcohol Use: No    Review of Systems ROS: Statement: All systems negative except as marked or noted in the HPI; Constitutional: Negative for fever and chills. ; ; Eyes: Negative for eye pain, redness and discharge. ; ; ENMT: Negative for ear pain, hoarseness, nasal congestion, sinus pressure and sore throat. ; ; Cardiovascular: Negative for chest pain, palpitations, diaphoresis, dyspnea and peripheral edema. ; ; Respiratory: Negative for cough, wheezing and stridor. ; ; Gastrointestinal: Negative for nausea, vomiting, diarrhea, abdominal pain, blood in stool, hematemesis, jaundice and rectal bleeding. . ; ; Genitourinary: +urinary retention. Negative for dysuria, flank pain and hematuria. ; ; Genital:  No penile drainage or rash, no testicular pain or swelling, no scrotal rash or swelling.;; Musculoskeletal: Negative for back pain and neck pain. Negative for swelling and trauma.; ; Skin: Negative for pruritus, rash, abrasions, blisters, bruising and skin lesion.; ; Neuro: Negative for headache, lightheadedness and neck stiffness. Negative for weakness,  altered level of consciousness , altered mental status, extremity weakness, paresthesias, involuntary movement, seizure and syncope.       Allergies  Hydrocodone-acetaminophen  Home Medications   Current Outpatient Rx  Name  Route  Sig  Dispense  Refill  . doxazosin (CARDURA) 8 MG tablet   Oral   Take 1 tablet (8 mg total) by mouth at bedtime.   90 tablet   3   . enalapril (VASOTEC) 10 MG tablet   Oral   Take 1 tablet (10 mg total) by mouth daily with breakfast.   90 tablet   3   . finasteride (PROSCAR) 5 MG tablet    Oral   Take 1 tablet (5 mg total) by mouth daily.   30 tablet   3   . glimepiride (AMARYL) 4 MG tablet   Oral   Take 1 tablet (4 mg total) by mouth daily.   90 tablet   3   . lovastatin (MEVACOR) 20 MG tablet   Oral   Take 1 tablet (20 mg total) by mouth daily.   90 tablet   3   . meclizine (ANTIVERT) 12.5 MG tablet   Oral   Take 12.5-25 mg by mouth 3 (three) times daily as needed for dizziness or nausea.         . metFORMIN (GLUCOPHAGE) 1000 MG tablet   Oral   Take 1 tablet (1,000 mg total) by mouth 2 (two) times daily with a meal.   180 tablet   3   . methocarbamol (ROBAXIN) 500 MG tablet   Oral   Take 1 tablet (500 mg total) by mouth every 6 (six) hours as needed for muscle spasms.   80 tablet   1   . nitroGLYCERIN (NITROSTAT) 0.4 MG SL tablet   Sublingual   Place 1 tablet (0.4 mg total) under the tongue every 5 (five) minutes as needed for chest pain.   15 tablet   3   . pantoprazole (PROTONIX) 40 MG tablet   Oral   Take 1 tablet (40 mg total) by mouth daily.   90 tablet   3   . rivaroxaban (XARELTO) 10 MG TABS tablet   Oral   Take 1 tablet (10 mg total) by mouth daily with breakfast.   18 tablet   1   . traMADol (ULTRAM) 50 MG tablet   Oral   Take 1-2 tablets (50-100 mg total) by mouth every 6 (six) hours as needed for moderate pain.   90 tablet   1    BP 177/88  Pulse 104  Temp(Src) 97.7 F (36.5 C) (Oral)  Resp 16  Ht 5\' 11"  (1.803 m)  Wt 250 lb (113.399 kg)  BMI 34.88 kg/m2  SpO2 98% Physical Exam 0905: Physical examination:  Nursing notes reviewed; Vital signs and O2 SAT reviewed;  Constitutional: Well developed, Well nourished, Well hydrated, Uncomfortable appearing.; Head:  Normocephalic, atraumatic; Eyes: EOMI, PERRL, No scleral icterus; ENMT: Mouth and pharynx normal, Mucous membranes moist; Neck: Supple, Full range of motion, No lymphadenopathy; Cardiovascular: Regular rate and rhythm, No gallop; Respiratory: Breath sounds clear &  equal bilaterally, No wheezes.  Speaking full sentences with ease, Normal respiratory effort/excursion; Chest: Nontender, Movement normal; Abdomen: Soft, Nontender, +suprapubic area distended. No rebound or guarding. Normal bowel sounds; Genitourinary: No CVA tenderness; Extremities: Pulses normal, No tenderness, No edema, No calf edema or asymmetry.; Neuro: AA&Ox3, Major CN grossly intact.  Speech clear. No gross focal motor or sensory deficits in extremities.; Skin:  Color normal, Warm, Dry.   ED Course  Procedures   EKG Interpretation   None       MDM  MDM Reviewed: previous chart, nursing note and vitals Interpretation: labs     Results for orders placed during the hospital encounter of 03/27/13  URINALYSIS W MICROSCOPIC + REFLEX CULTURE      Result Value Range   Color, Urine YELLOW  YELLOW   APPearance CLEAR  CLEAR   Specific Gravity, Urine 1.009  1.005 - 1.030   pH 5.5  5.0 - 8.0   Glucose, UA NEGATIVE  NEGATIVE mg/dL   Hgb urine dipstick NEGATIVE  NEGATIVE   Bilirubin Urine NEGATIVE  NEGATIVE   Ketones, ur NEGATIVE  NEGATIVE mg/dL   Protein, ur NEGATIVE  NEGATIVE mg/dL   Urobilinogen, UA 0.2  0.0 - 1.0 mg/dL   Nitrite NEGATIVE  NEGATIVE   Leukocytes, UA NEGATIVE  NEGATIVE   WBC, UA 0-2  <3 WBC/hpf   RBC / HPF 0-2  <3 RBC/hpf   Bacteria, UA RARE  RARE   Squamous Epithelial / LPF RARE  RARE     0945:  Pt wants to go home now. States he feels "much better" after foley placement. 1127ml urine output total. Pt already has Proscar, will continue to take it until Uro MD f/u. No UTI on Udip. Dx and testing d/w pt and family.  Questions answered.  Verb understanding, agreeable to d/c home with outpt f/u.   Alfonzo Feller, DO 03/30/13 [phone]

## 2013-03-27 NOTE — ED Notes (Signed)
Pt is here due to inability to urinate.  Pt voided last yesterday but it was just a "dribbling".  Pt is very uncomfortable.  Pt was seen by his PCP on 12/29 and started on finasteride due to urinary frequency (having to run to the bathroom to void).  Pt was told he probably had a enlarged prostate.  No pain or burning with urination previous to retention.

## 2013-03-27 NOTE — Discharge Instructions (Signed)
°Emergency Department Resource Guide °1) Find a Doctor and Pay Out of Pocket °Although you won't have to find out who is covered by your insurance plan, it is a good idea to ask around and get recommendations. You will then need to call the office and see if the doctor you have chosen will accept you as a new patient and what types of options they offer for patients who are self-pay. Some doctors offer discounts or will set up payment plans for their patients who do not have insurance, but you will need to ask so you aren't surprised when you get to your appointment. ° °2) Contact Your Local Health Department °Not all health departments have doctors that can see patients for sick visits, but many do, so it is worth a call to see if yours does. If you don't know where your local health department is, you can check in your phone book. The CDC also has a tool to help you locate your state's health department, and many state websites also have listings of all of their local health departments. ° °3) Find a Walk-in Clinic °If your illness is not likely to be very severe or complicated, you may want to try a walk in clinic. These are popping up all over the country in pharmacies, drugstores, and shopping centers. They're usually staffed by nurse practitioners or physician assistants that have been trained to treat common illnesses and complaints. They're usually fairly quick and inexpensive. However, if you have serious medical issues or chronic medical problems, these are probably not your best option. ° °No Primary Care Doctor: °- Call [facility] Connect at  832-8000 - they can help you locate a primary care doctor that  accepts your insurance, provides certain services, etc. °- Physician Referral Service- 1-800-533-3463 ° °Chronic Pain Problems: °Organization         Address  Phone   Notes  °[facility]  (336) [phone] Patients need to be referred by their primary care doctor.  ° °Medication  Assistance: °Organization         Address  Phone   Notes  °[place] [facility] [address] °[place], [place] [address] (336) 641-8030 --Must be a resident of [place] °-- Must have NO insurance coverage whatsoever (no Medicaid/ Medicare, etc.) °-- The pt. MUST have a primary care doctor that directs their care regularly and follows them in the community °  °[facility]  (866) 331-1348   °[employer]  (888) 892-1162   ° °Agencies that provide inexpensive medical care: °Organization         Address  Phone   Notes  °[facility]  (336) 832-8035   °[facility]    (336) 832-7272   °[facility] [address] °[place], [place] [address] (336) 832-4777   °[facility] [address], °[place] (336) 271-4999   °[facility]    (336) 373-0678   °[facility]    (336) 272-1050   °[facility] ° [address], [place] Phone:  (336) 832-4444, Fax:  (336) 832-4440 Hours of Operation:  9 am - 6 pm, M-F.  Also accepts Medicaid/Medicare and self-pay.  °[facility] ° [address], [place] Phone: (336) [phone], Fax: (336) [phone]. Hours of Operation:  8:30 am - 5:30 pm, M-F.  Also accepts Medicaid and self-pay.  °[facility] [place] [address], [place] Phone: (336) 878-6027   °[facility] [address], [place], [place] (336)723-1848, Ext. 123 Mondays & Thursdays: 7-9 AM.  First 15 patients are seen on a first come, first serve basis. °  ° °Medicaid-accepting [place] Providers: ° °Organization         Address  Phone   Notes  °[facility] [address], [place] (336) 641-2100 Also accepts self-pay patients.  °[facility] [address], [place] ° (336) 856-9996   °[facility] [address], [place]  (336) 288-8857   °[facility] [address] [place] [address], [place] (336) 299-7000   °Veita Bland [address], [place]  ° (336) 373-1557 Only accepts [place] Access Medicaid patients after they have their name applied to their card.  ° °Self-Pay (no insurance) in [place]: ° °Organization         Address  Phone   Notes  °Sickle Cell Patients, [facility] [address], [place] (336) 832-1970   °[facility] [address], [place] (336) 832-4400   °[facility] ° [address], [place] (336) 992-4800   °[facility]/Dr. Osei-Bonsu ° [address] [place] [address], [place] or [address], [place] (336) [phone] Phone number for both [place] and [place] locations is the same.  °[facility] [address], [place] (336) 299-0000   °[facility] [address] [place] [address], [place] or [address] (336) 852-7530 °(336) 878-2260   °[facility] [address], [place] (336) 350-1642, phone; (336) 294-5005, fax Sees patients 1st and 3rd Saturday of every month.  Must not qualify for public or private insurance (i.e. Medicaid, Medicare, [place] Health Choice, Veterans' Benefits) • Household income should be no more than 200% of the poverty level •The clinic cannot treat you if you are pregnant or think you are pregnant • Sexually transmitted diseases are not treated at the clinic.  ° ° °Dental Care: °Organization         Address  Phone  Notes  °[place] [facility] [address], [place] (336) 641-6152 Accepts children up to age 21 who are enrolled in Medicaid or [place] Health Choice; pregnant women with a Medicaid card; and children who have applied for Medicaid or [place] Health Choice, but were declined, whose parents can pay a reduced fee at time of service.  °[place]  [facility] [place]  [address], [place] (336) 641-7733 Accepts children up to age 21 who are enrolled in Medicaid or [place] Health Choice; pregnant women with a Medicaid card; and children who have applied for Medicaid or [place] Health Choice, but were declined, whose parents can pay a reduced fee at time of service.  °[facility] ° [address], [place] (336) 641-4533 Patients are seen by appointment only. Walk-ins are not accepted. [facility] will see patients 18 years of age and older. °Monday - Tuesday (8am-5pm) °Most Wednesdays (8:30-5pm) °$30 per visit, cash only  °[facility] ° [address], [place] (336) [phone] Patients are seen by appointment only. Walk-ins are not accepted. [facility] will see patients 18 years of age and older. °One   Wednesday Evening (Monthly: Volunteer Based).  $30 per visit, cash only  °[facility]  (919) 537-3737 for adults; Children under age 4, call Graduate Pediatric Dentistry at ([phone]) [phone]. Children aged 4-14, please call ([phone]) [phone] to request a pediatric application. ° Dental services are provided in all areas of dental care including fillings, crowns and bridges, complete and partial dentures, implants, gum treatment, root canals, and extractions. Preventive care is also provided. Treatment is provided to both adults and children. °Patients are selected via a lottery and there is often a waiting list. °  °[facility] [address], °[place] ° (336) 763-8833 [email or web address].[email or web address] °  °[facility] [address], [place], [place] (336)723-1848, Ext. 123 Second and Fourth Thursday of each month, opens at 6:30 AM; Clinic ends at 9 AM.  Patients are seen on a first-come first-served basis, and a limited number are seen during each clinic.  ° °[facility] ° [address], [place], [place] (336) [phone]    Eligibility Requirements °You must have lived in [place], [place], or [place] counties for at least the last three months. °  You cannot be eligible for state or federal sponsored healthcare insurance, including Veterans Administration, Medicaid, or Medicare. °  You generally cannot be eligible for healthcare insurance through your employer.  °  How to apply: °Eligibility screenings are held every Tuesday and Wednesday afternoon from 1:00 pm until 4:00 pm. You do not need an appointment for the interview!  °[facility] [address], [place], [place] 336-631-2330   °[facility]  336-342-8273   °[facility]  336-703-3100   °[facility]  336-570-6415   ° °Behavioral Health Resources in the Community: °Intensive Outpatient Programs °Organization         Address  Phone  Notes  °[facility] [address], [place], [place] 336-878-6098   °[facility] [address], [place], [place] 336-832-9800   °[facility] [address], [place], [place] ° 336-882-2125   °[facility] [address],  °[place], [place] 1-800-853-5163 or 336-641-4981   °Substance Abuse Resources °Organization         Address  Phone  Notes  °[facility]  336-882-2125   °[facility]  336-784-9470   °[facility]  336-285-9073   °[facility]  336-845-3988   °Residential & Outpatient Substance Abuse Program  1-800-659-3381   °Psychological Services °Organization         Address  Phone  Notes  °[facility]  336- 832-9600   °[facility]  336- 378-7881   °[facility] [address], [place] 1-800-853-5163 or 336-641-4981   ° °Mobile Crisis Teams °Organization         Address  Phone  Notes  °[facility], Mobile Crisis Care Unit  1-877-626-1772   °Assertive °[facility] ° [address]  [place], [place] 336-834-9664   °Sharon DeEsch [address] °[place] 336-554-5454   ° °Self-Help/Support Groups °Organization         Address  Phone             Notes  °[facility] [place] - variety of support groups  336- 373-1402 Call for more information  °Narcotics Anonymous (NA), [facility] [address], °[place]  2 meetings at this location  ° °  Residential Treatment Programs Organization         Address  Phone  Notes  [facility] [address]    [place]  1-(419)185-0997   [facility]  [address] [place] [address], [place], [place]   [place], [place] 228-219-4544 Admissions: 8am-3pm M-F  [facility] [address]    [place], [place] 992-426-8341   [facility] [address] [place], [place], [place]   [facility] [address],  [place], [place]   [facility] Intensive Outpatient [address], [place], [place]   [facility] ([place] [place]  [place], [place] 1-214 659 5266 or 657-847-7835   [facility] [address], [place], [place] Accepts Medicaid  [facility] [address],  [place] 1-931-877-6077 Substance Abuse/Addiction Treatment   [facility]         Address  Phone  Notes  [facility]  5031889539   Domenic Schwab, PhD [address] [place], [place]   301-398-7020 or (951)287-8614   [facility] [place], [place] (571)705-2076   [facility] [address] [place], [place] 970-393-2769 Insurance/Medicaid/sponsorship through [facility] [address]                                    [place], [place] [phone] [place] [address], [place] [phone]    Dr. Adele Schilder  (769)[phone]   [facility] 1) [address] [place] [address]) [address])  [address], [place] [phone]  [phone]   [place] (540)852-6831 or [phone] (After Hours)      Take your usual prescriptions as previously directed.  Call your Urologist today to schedule a follow up appointment within the next 3 days.  Return to the Emergency Department immediately sooner if worsening.

## 2013-03-27 NOTE — Telephone Encounter (Signed)
Pt request phone call from Dr. Camila Li assistant concern about pt is experiencing difficulty urinate. Offer an appt today w/ [facility] but pt want to go to the ER. Please call pt

## 2013-03-27 NOTE — ED Notes (Signed)
Assisted patient and family with instructions on applying and maintaining a leg bag. Patient to follow up with urology.

## 2013-03-27 NOTE — ED Notes (Signed)
1030ml clear, light yellow urine returned immediately upon sterile insertion (without any problems or resistance) of 45F foley cath

## 2013-03-27 NOTE — Telephone Encounter (Signed)
Pt is in the [facility] ER.  He needs to make an appt with Dr. Karsten Ro at [facility] urology for difficulty urinating per the ER doctor.

## 2013-03-28 NOTE — Telephone Encounter (Signed)
Ok Done Thx 

## 2013-03-29 ENCOUNTER — Telehealth: Payer: Self-pay | Admitting: Internal Medicine

## 2013-03-29 NOTE — Telephone Encounter (Signed)
Pt would like to know when was the last time Johnny [name] had a prostate exam and pt is requesting a status of the urology referral [facility] is waiting for the appt date and time, [facility] faxed paper work 1.22.15) Please follow up, pt will be back here at 1:30 pm (had an appt with the PA at urology) to request to speak to the asistant.

## 2013-04-30 ENCOUNTER — Ambulatory Visit: Payer: Medicare Other | Admitting: Internal Medicine

## 2013-05-21 ENCOUNTER — Other Ambulatory Visit: Payer: Self-pay | Admitting: Urology

## 2013-05-21 ENCOUNTER — Encounter (HOSPITAL_BASED_OUTPATIENT_CLINIC_OR_DEPARTMENT_OTHER): Payer: Self-pay | Admitting: *Deleted

## 2013-05-21 NOTE — Progress Notes (Addendum)
NPO AFTER MN. ARRIVE AT 0900. NEEDS ISTAT.  CURRENT EKG AND CXR IN EPIC AND CHART. WILL TAKE CADURA, PROTONIX, LOVASTATIN, AND PROSCAR AM DOS W/ SIPS OF WATER.  PT START TIME CHANGED.  SPOKE W/ PT WILL ARRIVE AT 0745 WITH SAME INSTRUCTIONS AS ABOVE.

## 2013-05-21 NOTE — Progress Notes (Signed)
05/21/13 1451  OBSTRUCTIVE SLEEP APNEA  Have you ever been diagnosed with sleep apnea through a sleep study? No  Do you snore loudly (loud enough to be heard through closed doors)?  1  Do you often feel tired, fatigued, or sleepy during the daytime? 0  Has anyone observed you stop breathing during your sleep? 0  Do you have, or are you being treated for high blood pressure? 1  BMI more than 35 kg/m2? 1  Age over 69 years old? 1  Neck circumference greater than 40 cm/18 inches? 1  Gender: 1  Obstructive Sleep Apnea Score 6  Score 4 or greater  Results sent to PCP

## 2013-05-27 ENCOUNTER — Encounter (HOSPITAL_BASED_OUTPATIENT_CLINIC_OR_DEPARTMENT_OTHER)
Admission: RE | Disposition: A | Discharge status: Home / Self Care | Payer: Self-pay | Source: Ambulatory Visit | Attending: Urology

## 2013-05-27 ENCOUNTER — Ambulatory Visit (HOSPITAL_BASED_OUTPATIENT_CLINIC_OR_DEPARTMENT_OTHER)
Admission: RE | Admit: 2013-05-27 | Discharge: 2013-05-27 | Disposition: A | Discharge status: Home / Self Care | Payer: Medicare Other | Source: Ambulatory Visit | Attending: Urology | Admitting: Urology

## 2013-05-27 ENCOUNTER — Ambulatory Visit (HOSPITAL_BASED_OUTPATIENT_CLINIC_OR_DEPARTMENT_OTHER): Payer: Medicare Other | Admitting: Anesthesiology

## 2013-05-27 ENCOUNTER — Encounter (HOSPITAL_BASED_OUTPATIENT_CLINIC_OR_DEPARTMENT_OTHER): Payer: Self-pay | Admitting: Anesthesiology

## 2013-05-27 ENCOUNTER — Encounter (HOSPITAL_BASED_OUTPATIENT_CLINIC_OR_DEPARTMENT_OTHER): Payer: Medicare Other | Admitting: Anesthesiology

## 2013-05-27 DIAGNOSIS — F528 Other sexual dysfunction not due to a substance or known physiological condition: Secondary | ICD-10-CM

## 2013-05-27 DIAGNOSIS — Z7982 Long term (current) use of aspirin: Secondary | ICD-10-CM | POA: Insufficient documentation

## 2013-05-27 DIAGNOSIS — Z87891 Personal history of nicotine dependence: Secondary | ICD-10-CM | POA: Insufficient documentation

## 2013-05-27 DIAGNOSIS — T8389XA Other specified complication of genitourinary prosthetic devices, implants and grafts, initial encounter: Secondary | ICD-10-CM | POA: Insufficient documentation

## 2013-05-27 DIAGNOSIS — I2510 Atherosclerotic heart disease of native coronary artery without angina pectoris: Secondary | ICD-10-CM | POA: Insufficient documentation

## 2013-05-27 DIAGNOSIS — N138 Other obstructive and reflux uropathy: Secondary | ICD-10-CM | POA: Insufficient documentation

## 2013-05-27 DIAGNOSIS — Z86711 Personal history of pulmonary embolism: Secondary | ICD-10-CM | POA: Insufficient documentation

## 2013-05-27 DIAGNOSIS — E119 Type 2 diabetes mellitus without complications: Secondary | ICD-10-CM | POA: Insufficient documentation

## 2013-05-27 DIAGNOSIS — I10 Essential (primary) hypertension: Secondary | ICD-10-CM | POA: Insufficient documentation

## 2013-05-27 DIAGNOSIS — K219 Gastro-esophageal reflux disease without esophagitis: Secondary | ICD-10-CM | POA: Insufficient documentation

## 2013-05-27 DIAGNOSIS — Y831 Surgical operation with implant of artificial internal device as the cause of abnormal reaction of the patient, or of later complication, without mention of misadventure at the time of the procedure: Secondary | ICD-10-CM | POA: Insufficient documentation

## 2013-05-27 DIAGNOSIS — E669 Obesity, unspecified: Secondary | ICD-10-CM | POA: Insufficient documentation

## 2013-05-27 DIAGNOSIS — Z96649 Presence of unspecified artificial hip joint: Secondary | ICD-10-CM | POA: Insufficient documentation

## 2013-05-27 DIAGNOSIS — Z79899 Other long term (current) drug therapy: Secondary | ICD-10-CM | POA: Insufficient documentation

## 2013-05-27 DIAGNOSIS — N401 Enlarged prostate with lower urinary tract symptoms: Secondary | ICD-10-CM | POA: Insufficient documentation

## 2013-05-27 DIAGNOSIS — R339 Retention of urine, unspecified: Secondary | ICD-10-CM | POA: Insufficient documentation

## 2013-05-27 DIAGNOSIS — Z951 Presence of aortocoronary bypass graft: Secondary | ICD-10-CM | POA: Insufficient documentation

## 2013-05-27 HISTORY — DX: Presence of spectacles and contact lenses: Z97.3

## 2013-05-27 HISTORY — DX: Male erectile dysfunction, unspecified: N52.9

## 2013-05-27 HISTORY — PX: PENILE PROSTHESIS IMPLANT: SHX240

## 2013-05-27 HISTORY — DX: Gastro-esophageal reflux disease without esophagitis: K21.9

## 2013-05-27 HISTORY — DX: Personal history of other diseases of the circulatory system: Z86.79

## 2013-05-27 HISTORY — DX: Other specified personal risk factors, not elsewhere classified: Z91.89

## 2013-05-27 HISTORY — DX: Type 2 diabetes mellitus without complications: E11.9

## 2013-05-27 LAB — GLUCOSE, CAPILLARY: Glucose-Capillary: 168 mg/dL — ABNORMAL HIGH (ref 70–99)

## 2013-05-27 LAB — POCT I-STAT 4, (NA,K, GLUC, HGB,HCT)
Glucose, Bld: 127 mg/dL — ABNORMAL HIGH (ref 70–99)
HCT: 36 % — ABNORMAL LOW (ref 39.0–52.0)
Hemoglobin: 12.2 g/dL — ABNORMAL LOW (ref 13.0–17.0)
Potassium: 3.8 mEq/L (ref 3.7–5.3)
Sodium: 142 mEq/L (ref 137–147)

## 2013-05-27 SURGERY — INSERTION, PENILE PROSTHESIS
Anesthesia: General | Site: Scrotum

## 2013-05-27 MED ORDER — LACTATED RINGERS IV SOLN
INTRAVENOUS | Status: DC
  Administered 2013-05-27 (×2): via INTRAVENOUS
  Filled 2013-05-27: qty 1000

## 2013-05-27 MED ORDER — ONDANSETRON HCL 4 MG/2ML IJ SOLN
INTRAMUSCULAR | Status: DC | PRN
  Administered 2013-05-27 (×2): 4 mg via INTRAVENOUS

## 2013-05-27 MED ORDER — KETOROLAC TROMETHAMINE 30 MG/ML IJ SOLN
INTRAMUSCULAR | Status: DC | PRN
  Administered 2013-05-27: 30 mg via INTRAVENOUS

## 2013-05-27 MED ORDER — FENTANYL CITRATE 0.05 MG/ML IJ SOLN
25.0000 ug | INTRAMUSCULAR | Status: DC | PRN
  Administered 2013-05-27: 25 ug via INTRAVENOUS
  Filled 2013-05-27: qty 1

## 2013-05-27 MED ORDER — HYDROMORPHONE HCL 2 MG PO TABS: 2.0000 mg | ORAL_TABLET | ORAL | Status: DC | PRN

## 2013-05-27 MED ORDER — FENTANYL CITRATE 0.05 MG/ML IJ SOLN
INTRAMUSCULAR | Status: AC
  Filled 2013-05-27: qty 2

## 2013-05-27 MED ORDER — FENTANYL CITRATE 0.05 MG/ML IJ SOLN
INTRAMUSCULAR | Status: DC | PRN
  Administered 2013-05-27 (×7): 25 ug via INTRAVENOUS
  Administered 2013-05-27: 50 ug via INTRAVENOUS
  Administered 2013-05-27: 25 ug via INTRAVENOUS

## 2013-05-27 MED ORDER — SODIUM CHLORIDE 0.9 % IR SOLN
Freq: Once | Status: DC
  Filled 2013-05-27 (×2): qty 1000

## 2013-05-27 MED ORDER — HYDROGEN PEROXIDE 3 % EX SOLN
CUTANEOUS | Status: DC | PRN
  Administered 2013-05-27: 1

## 2013-05-27 MED ORDER — HYDROMORPHONE HCL 2 MG PO TABS
ORAL_TABLET | ORAL | Status: AC
Start: 2013-05-27 — End: 2013-05-27
  Filled 2013-05-27: qty 1

## 2013-05-27 MED ORDER — FENTANYL CITRATE 0.05 MG/ML IJ SOLN
INTRAMUSCULAR | Status: AC
  Filled 2013-05-27: qty 4

## 2013-05-27 MED ORDER — CEFAZOLIN SODIUM-DEXTROSE 2-3 GM-% IV SOLR
2.0000 g | INTRAVENOUS | Status: AC
  Administered 2013-05-27: 2 g via INTRAVENOUS
  Filled 2013-05-27: qty 50

## 2013-05-27 MED ORDER — ACETAMINOPHEN 10 MG/ML IV SOLN
INTRAVENOUS | Status: DC | PRN
Start: 2013-05-27 — End: 2013-05-27
  Administered 2013-05-27: 1000 mg via INTRAVENOUS

## 2013-05-27 MED ORDER — SODIUM CHLORIDE 0.9 % IR SOLN
Status: DC | PRN
  Administered 2013-05-27: 10:00:00

## 2013-05-27 MED ORDER — GLYCOPYRROLATE 0.2 MG/ML IJ SOLN
INTRAMUSCULAR | Status: DC | PRN
  Administered 2013-05-27: 0.2 mg via INTRAVENOUS

## 2013-05-27 MED ORDER — METOCLOPRAMIDE HCL 5 MG/ML IJ SOLN
INTRAMUSCULAR | Status: DC | PRN
  Administered 2013-05-27: 10 mg via INTRAVENOUS

## 2013-05-27 MED ORDER — MIDAZOLAM HCL 2 MG/2ML IJ SOLN
INTRAMUSCULAR | Status: AC
  Filled 2013-05-27: qty 2

## 2013-05-27 MED ORDER — PROMETHAZINE HCL 25 MG/ML IJ SOLN
6.2500 mg | INTRAMUSCULAR | Status: DC | PRN
  Filled 2013-05-27: qty 1

## 2013-05-27 MED ORDER — DEXAMETHASONE SODIUM PHOSPHATE 4 MG/ML IJ SOLN
INTRAMUSCULAR | Status: DC | PRN
  Administered 2013-05-27: 4 mg via INTRAVENOUS

## 2013-05-27 MED ORDER — PROPOFOL 10 MG/ML IV BOLUS
INTRAVENOUS | Status: DC | PRN
  Administered 2013-05-27: 20 mg via INTRAVENOUS
  Administered 2013-05-27: 180 mg via INTRAVENOUS

## 2013-05-27 MED ORDER — SODIUM CHLORIDE 0.9 % IR SOLN
Status: DC | PRN
  Administered 2013-05-27: 09:00:00

## 2013-05-27 MED ORDER — BACITRACIN ZINC 500 UNIT/GM EX OINT
TOPICAL_OINTMENT | CUTANEOUS | Status: DC | PRN
  Administered 2013-05-27: 1 via TOPICAL

## 2013-05-27 MED ORDER — MIDAZOLAM HCL 5 MG/5ML IJ SOLN
INTRAMUSCULAR | Status: DC | PRN
  Administered 2013-05-27: 1 mg via INTRAVENOUS

## 2013-05-27 MED ORDER — STERILE WATER FOR IRRIGATION IR SOLN
Status: DC | PRN
  Administered 2013-05-27: 3000 mL

## 2013-05-27 MED ORDER — SODIUM CHLORIDE 0.9 % IR SOLN
Status: DC | PRN
  Administered 2013-05-27: 1500 mL

## 2013-05-27 MED ORDER — LIDOCAINE HCL (CARDIAC) 20 MG/ML IV SOLN
INTRAVENOUS | Status: DC | PRN
  Administered 2013-05-27: 60 mg via INTRAVENOUS

## 2013-05-27 MED ORDER — HYDROMORPHONE HCL 2 MG PO TABS
2.0000 mg | ORAL_TABLET | ORAL | Status: DC | PRN
  Administered 2013-05-27: 2 mg via ORAL
  Filled 2013-05-27: qty 1

## 2013-05-27 MED ORDER — POVIDONE-IODINE 10 % EX SOLN
CUTANEOUS | Status: DC | PRN
  Administered 2013-05-27: 1 via TOPICAL

## 2013-05-27 SURGICAL SUPPLY — 74 items
APPLICATOR COTTON TIP 6IN STRL (MISCELLANEOUS) IMPLANT
BAG DECANTER FOR FLEXI CONT (MISCELLANEOUS) IMPLANT
BAG URINE DRAINAGE (UROLOGICAL SUPPLIES) ×2 IMPLANT
BANDAGE CO FLEX L/F 2IN X 5YD (GAUZE/BANDAGES/DRESSINGS) IMPLANT
BANDAGE GAUZE ELAST BULKY 4 IN (GAUZE/BANDAGES/DRESSINGS) ×2 IMPLANT
BENZOIN TINCTURE PRP APPL 2/3 (GAUZE/BANDAGES/DRESSINGS) IMPLANT
BLADE HEX COATED 2.75 (ELECTRODE) ×2 IMPLANT
BLADE SURG 10 STRL SS (BLADE) IMPLANT
BLADE SURG 15 STRL LF DISP TIS (BLADE) ×2 IMPLANT
BLADE SURG 15 STRL SS (BLADE) ×2
BLADE SURG ROTATE 9660 (MISCELLANEOUS) IMPLANT
BNDG GAUZE ELAST 4 BULKY (GAUZE/BANDAGES/DRESSINGS) ×2 IMPLANT
CANISTER SUCT LVC 12 LTR MEDI- (MISCELLANEOUS) ×2 IMPLANT
CANISTER SUCTION 1200CC (MISCELLANEOUS) ×2 IMPLANT
CANISTER SUCTION 2500CC (MISCELLANEOUS) ×4 IMPLANT
CATH FOLEY 2WAY SLVR  5CC 16FR (CATHETERS) ×1
CATH FOLEY 2WAY SLVR 5CC 16FR (CATHETERS) ×1 IMPLANT
CATH ROBINSON RED A/P 16FR (CATHETERS) ×10 IMPLANT
CHLORAPREP W/TINT 26ML (MISCELLANEOUS) ×2 IMPLANT
CLOTH BEACON ORANGE TIMEOUT ST (SAFETY) ×2 IMPLANT
COVER MAYO STAND STRL (DRAPES) ×4 IMPLANT
COVER TABLE BACK 60X90 (DRAPES) ×2 IMPLANT
DERMABOND ADVANCED (GAUZE/BANDAGES/DRESSINGS)
DERMABOND ADVANCED .7 DNX12 (GAUZE/BANDAGES/DRESSINGS) IMPLANT
DISSECTOR ROUND CHERRY 3/8 STR (MISCELLANEOUS) IMPLANT
DRAIN PENROSE 18X1/4 LTX STRL (WOUND CARE) ×2 IMPLANT
DRAPE EXTREMITY T 121X128X90 (DRAPE) ×2 IMPLANT
DRAPE LAPAROTOMY TRNSV 102X78 (DRAPE) IMPLANT
DRSG TEGADERM 4X4.75 (GAUZE/BANDAGES/DRESSINGS) ×2 IMPLANT
ELECT REM PT RETURN 9FT ADLT (ELECTROSURGICAL) ×2
ELECTRODE REM PT RTRN 9FT ADLT (ELECTROSURGICAL) ×1 IMPLANT
GAUZE SPONGE 4X4 12PLY STRL LF (GAUZE/BANDAGES/DRESSINGS) ×2 IMPLANT
GLOVE BIO SURGEON STRL SZ8 (GLOVE) ×2 IMPLANT
GLOVE BIOGEL M 6.5 STRL (GLOVE) ×2 IMPLANT
GLOVE BIOGEL M STER SZ 6 (GLOVE) ×2 IMPLANT
GLOVE BIOGEL PI IND STRL 6.5 (GLOVE) ×2 IMPLANT
GLOVE BIOGEL PI INDICATOR 6.5 (GLOVE) ×2
GOWN PREVENTION PLUS LG XLONG (DISPOSABLE) IMPLANT
GOWN STRL REIN XL XLG (GOWN DISPOSABLE) IMPLANT
GOWN STRL REUS W/TWL LRG LVL3 (GOWN DISPOSABLE) ×4 IMPLANT
GOWN STRL REUS W/TWL XL LVL3 (GOWN DISPOSABLE) ×2 IMPLANT
GUIDEWIRE STR DUAL SENSOR (WIRE) ×2 IMPLANT
HOLDER FOLEY CATH W/STRAP (MISCELLANEOUS) ×2 IMPLANT
NS IRRIG 500ML POUR BTL (IV SOLUTION) ×6 IMPLANT
PACK BASIN DAY SURGERY FS (CUSTOM PROCEDURE TRAY) ×2 IMPLANT
PENCIL BUTTON HOLSTER BLD 10FT (ELECTRODE) ×2 IMPLANT
PLUG CATH AND CAP STER (CATHETERS) ×2 IMPLANT
RETRACTOR WILSON SYSTEM (INSTRUMENTS) ×2 IMPLANT
SET IRRIG Y TYPE TUR BLADDER L (SET/KITS/TRAYS/PACK) ×2 IMPLANT
SPONGE GAUZE 4X4 12PLY STER LF (GAUZE/BANDAGES/DRESSINGS) ×2 IMPLANT
SPONGE LAP 4X18 X RAY DECT (DISPOSABLE) ×6 IMPLANT
STRIP CLOSURE SKIN 1/2X4 (GAUZE/BANDAGES/DRESSINGS) IMPLANT
SUPPORT SCROTAL LG STRP (MISCELLANEOUS) IMPLANT
SURGILUBE 2OZ TUBE FLIPTOP (MISCELLANEOUS) ×2 IMPLANT
SUT CHROMIC 3 0 SH 27 (SUTURE) ×6 IMPLANT
SUT MNCRL AB 4-0 PS2 18 (SUTURE) ×2 IMPLANT
SUT PDS AB 2-0 CT2 27 (SUTURE) ×6 IMPLANT
SUT VIC AB 2-0 UR6 27 (SUTURE) ×2 IMPLANT
SUT VIC AB 3-0 SH 27 (SUTURE)
SUT VIC AB 3-0 SH 27X BRD (SUTURE) IMPLANT
SUT VICRYL 0 UR6 27IN ABS (SUTURE) ×8 IMPLANT
SUT VICRYL 4-0 PS2 18IN ABS (SUTURE) IMPLANT
SYR 20CC LL (SYRINGE) IMPLANT
SYR 50ML LL SCALE MARK (SYRINGE) IMPLANT
SYR BULB IRRIGATION 50ML (SYRINGE) ×2 IMPLANT
SYRINGE 10CC LL (SYRINGE) ×4 IMPLANT
SYRINGE IRR TOOMEY STRL 70CC (SYRINGE) ×10 IMPLANT
TOWEL OR 17X24 6PK STRL BLUE (TOWEL DISPOSABLE) ×4 IMPLANT
TRAY DSU PREP LF (CUSTOM PROCEDURE TRAY) ×2 IMPLANT
TUBE CONNECTING 12X1/4 (SUCTIONS) ×6 IMPLANT
TUBING SUCTION BULK 100 FT (MISCELLANEOUS) IMPLANT
WATER STERILE IRR 3000ML UROMA (IV SOLUTION) IMPLANT
WATER STERILE IRR 500ML POUR (IV SOLUTION) ×2 IMPLANT
YANKAUER SUCT BULB TIP NO VENT (SUCTIONS) ×6 IMPLANT

## 2013-05-27 NOTE — OR Nursing (Signed)
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 Sent to  [employer].

## 2013-05-27 NOTE — Anesthesia Preprocedure Evaluation (Addendum)
Anesthesia Evaluation  Patient identified by MRN, date of birth, ID band Patient awake    Reviewed: Allergy & Precautions, H&P , NPO status , Patient's Chart, lab work & pertinent test results  Airway Mallampati: II TM Distance: >3 FB Neck ROM: Full    Dental no notable dental hx.    Pulmonary neg pulmonary ROS, former smoker,  breath sounds clear to auscultation  Pulmonary exam normal       Cardiovascular Exercise Tolerance: Good hypertension, Pt. on medications + CAD Rhythm:Regular Rate:Normal  ECG: Normal.  Reviewed cardiology visit with Dr. Lovena Le on 03-22-12.   Neuro/Psych negative neurological ROS  negative psych ROS   GI/Hepatic Neg liver ROS, GERD-  Medicated,  Endo/Other  diabetes, Type 2, Oral Hypoglycemic Agents  Renal/GU negative Renal ROS  negative genitourinary   Musculoskeletal negative musculoskeletal ROS (+)   Abdominal (+) + obese,   Peds negative pediatric ROS (+)  Hematology negative hematology ROS (+)   Anesthesia Other Findings   Reproductive/Obstetrics negative OB ROS                        Anesthesia Physical Anesthesia Plan  ASA: III  Anesthesia Plan: General   Post-op Pain Management:    Induction: Intravenous  Airway Management Planned: LMA  Additional Equipment:   Intra-op Plan:   Post-operative Plan: Extubation in OR  Informed Consent: I have reviewed the patients History and Physical, chart, labs and discussed the procedure including the risks, benefits and alternatives for the proposed anesthesia with the patient or authorized representative who has indicated his/her understanding and acceptance.   Dental advisory given  Plan Discussed with: CRNA  Anesthesia Plan Comments:         Anesthesia Quick Evaluation

## 2013-05-27 NOTE — Transfer of Care (Signed)
Immediate Anesthesia Transfer of Care Note  Patient: Johnny [name]  Procedure(s) Performed: Procedure(s) (LRB): CYSTO REMOVAL OF PENILE PROSTHESIS (N/A)  Patient Location: PACU  Anesthesia Type: General  Level of Consciousness: awake, alert  and oriented  Airway & Oxygen Therapy: Patient Spontanous Breathing and Patient connected to face mask oxygen, oral airway remaining.  Post-op Assessment: Report given to PACU RN and Post -op Vital signs reviewed and stable  Post vital signs: Reviewed and stable  Complications: No apparent anesthesia complications

## 2013-05-27 NOTE — Discharge Instructions (Addendum)
Scrotal surgery postoperative instructions  Wound:  In most cases your incision will have absorbable sutures that will dissolve within the first 10-20 days. Some will fall out even earlier. Expect some redness as the sutures dissolved but this should occur only around the sutures. If there is generalized redness, especially with increasing pain or swelling, let Johnny [name] know. The scrotum will very likely get "black and blue" as the blood in the tissues spread. Sometimes the whole scrotum will turn colors. The black and blue is followed by a yellow and brown color. In time, all the discoloration will go away. In some cases some firm swelling in the area of the testicle may persist for up to 4-6 weeks after the surgery and is considered normal in most cases.  On Thursday 05/30/13 I want you to begin pulling the rubber tubes that exit through the end of the penis approximately 3/4 inches each day. There may be a moderate amount of resistance. Make sure the penis is straight out when doing this. If you have any difficulties please contact the office.  Diet:  You may return to your normal diet within 24 hours following your surgery. You may note some mild nausea and possibly vomiting the first 6-8 hours following surgery. This is usually due to the side effects of anesthesia, and will disappear quite soon. I would suggest clear liquids and a very light meal the first evening following your surgery.  Activity:  Your physical activity should be restricted the first 48 hours. During that time you should remain relatively inactive, moving about only when necessary. During the first 7-10 days following surgery he should avoid lifting any heavy objects (anything greater than 15 pounds), and avoid strenuous exercise. If you work, ask Johnny [name] specifically about your restrictions, both for work and home. We will write a note to your employer if needed.  You should plan to wear a tight pair of jockey shorts or an athletic  supporter for the first 4-5 days, even to sleep. This will keep the scrotum immobilized to some degree and keep the swelling down.  Ice packs should be placed on and off over the scrotum for the first 48 hours. Frozen peas or corn in a ZipLock bag can be frozen, used and re-frozen. Fifteen minutes on and 15 minutes off is a reasonable schedule. The ice is a good pain reliever and keeps the swelling down.  Hygiene:  You may shower 48 hours after your surgery. Tub bathing should be restricted until the seventh day.  Medication:  You will be sent home with some type of pain medication. In many cases you will be sent home with a narcotic pain pill (hydrococone or oxycodone). If the pain is not too bad, you may take either Tylenol (acetaminophen) or Advil (ibuprofen) which contain no narcotic agents, and might be tolerated a little better, with fewer side effects. If the pain medication you are sent home with does not control the pain, you will have to let Johnny [name] know. Some narcotic pain medications cannot be given or refilled by a phone call to a pharmacy.  Problems you should report to Johnny [name]:   Fever of 101.0 degrees Fahrenheit or greater.  Moderate or severe swelling under the skin incision or involving the scrotum.  Drug reaction such as hives, a rash, nausea or vomiting.   Post Anesthesia Home Care Instructions  Activity: Get plenty of rest for the remainder of the day. A responsible adult should stay with you for 24 hours  following the procedure.  For the next 24 hours, DO NOT: -Drive a car -Paediatric nurse -Drink alcoholic beverages -Take any medication unless instructed by your physician -Make any legal decisions or sign important papers.  Meals: Start with liquid foods such as gelatin or soup. Progress to regular foods as tolerated. Avoid greasy, spicy, heavy foods. If nausea and/or vomiting occur, drink only clear liquids until the nausea and/or vomiting subsides. Call your  physician if vomiting continues.  Special Instructions/Symptoms: Your throat may feel dry or sore from the anesthesia or the breathing tube placed in your throat during surgery. If this causes discomfort, gargle with warm salt water. The discomfort should disappear within 24 hours.

## 2013-05-27 NOTE — Op Note (Signed)
PATIENT:  Johnny [name]  PRE-OPERATIVE DIAGNOSIS:  Eroded right corporal prosthesis cylinder  POST-OPERATIVE DIAGNOSIS:  Erosion of bilateral corporal prostheses cylinders  PROCEDURE:  Procedure(s): 1. Cystoscopy 2. Removal of three-piece inflatable penile prosthesis 3. Flexible cystoscopic evaluation of the corpus cavernosum on the left-hand side.  SURGEON:  Claybon Jabs  INDICATION: In 1/15 he developed urinary retention and had a Foley catheter placed. When he came to my office I immediately removed his catheter and he passed his voiding trial. He then presented to the office on 05/21/13 with an erosion of his prosthesis out through the urethral meatus. There was no evidence of infection at that time however he was placed on oral antibiotic therapy and is brought to the operating room for removal of the device.  ANESTHESIA:  General  EBL:  Minimal  LOCAL MEDICATIONS USED:  None  SPECIMEN: 1. Prosthesis was sent back to the [employer]) 2. Aerobic and anaerobic culture obtained from the right corpus cavernosum  DISPOSITION OF SPECIMEN:  N/A  Description of procedure: The patient was taken to the major operating room, placed on the table and administered general anesthesia in the supine position. His genitalia was then scrubbed for 10 minutes, painted and then sterilely draped. An official timeout was then performed.  Cystoscopy was initially performed using the 17 French flexible cystoscope. It was passed under direct vision through the meatus and down the urethra which was noted be normal except at the distal aspect where the right prosthesis cylinders could be seen exiting just inside the urethral meatus. The remainder of the urethra was normal. The prostate was noted to have some hypertrophy but no lesions were noted in the bladder was then entered and fully inspected. 2+ trabeculation was noted. Ureteral orifices were of normal configuration and position. There are no  tumors, stones or inflammatory lesions identified.  A 16 French Foley catheter was placed in the bladder and the bladder was drained and the catheter was plugged. A midline median raphae scrotal incision was then made and carried down to the pump. I incised over the pump and noted some clear fluid but no purulent fluid. I then followed the tubing from the pump toward the right corpus cavernosum and cut down on the tubing using the Bovie until I was able to expose the cylinder. It was grasped through the corporotomy with right angle clamp and extracted. There appeared to be some possibly purulent fluid present in the corpus cavernosum. I cut down on the tubing until I was able to free the cylinder and its attached rear-tip extender and remove these. 2-0 Vicryl stay sutures were placed in the corporotomy for identification and I obtained aerobic and anaerobic cultures from the proximal portion of the corpus cavernosum.  I then performed an identical procedure on the left-hand side. The dissection on this side was much easier. I was able to remove the cylinder and attached rear-tip extender on the left-hand side without difficulty and placed stay sutures in the edges of the corporotomy. I then turned my attention to the tubing to the reservoir and incised the tissue anterior to the tubing down to the neck of the reservoir and was able to remove this without difficulty.  My feeling at that time was that with an obvious erosion on the right-hand side and would appear to be purulent material in the right corpus cavernosum that placement of any form of foreign body on this side would be ill advised. I did however feel that  with a [place] washout I could potentially place a semirigid prosthesis on the left-hand side to hold the corpus cavernosum opened in preparation for reimplantation. I therefore placed 16 French red rubber catheters in the right and left proximal corpus cavernosum, the right and left distal corpus  cavernosum and in the previous location of the reservoir. I then proceeded with a [place] washout.  I used a solution of gentamicin 240 mg plus bacitracin 50,000 units, half-strength peroxide and saline and half-strength iodine in saline solution. I used a 60 cc catheter-tip syringes and began irrigating each of the catheters with 2 syringe full first of the antibiotic solution then with the iodine solution and then with a peroxide solution and finally again with antibiotic solution. In doing so the irrigant when placed in the catheter in the distal right corpus cavernosum would exit both in the scrotum and out through the urethral meatus as expected. What was unexpected however was that when I irrigated the distal left corpus cavernosum I noted irrigant exiting the urethral meatus as well. This indicated that there was an erosion on the left-hand side as well and therefore placement of a prosthesis to hold the corpus cavernosum on this side could not be safely performed without risk of infection. I therefore elected not to place the semirigid prosthesis on the left-hand side after all.  In order to evaluate the distal corpora on the left-hand side I performed corporoscopy. I used the 61 [place] flexible scope and passed this through the corporotomy on the left-hand side and out the corpora toward the glans and identified a small opening in the corpora its medial aspect. In order to facilitate the placement of a Penrose drain through the small opening I passed a 0.038 inch floppy-tipped guidewire through the cystoscope and out through the opening in the corpora and then out the urethral meatus. I then chose a 1/4 inch Penrose drain and secured this to the guidewire with the suture and was able to gently negotiate the Penrose drain through the distal corporal erosion and down into the scrotal incision. I freed it from the guidewire and then placed this Penrose drain into the proximal portion of the corpus  cavernosum on the left-hand side. On the right-hand side I was able to pass a tonsil clamp through the corporotomy in the scrotum and out through the urethral meatus grasping a Penrose drain on this side and drawn into the scrotum and then placing it in the proximal corpus cavernosum on the right-hand side. The scrotum and pump location were then irrigated identically as above.  I then closed the corpus cavernosum on the right-hand side and the left-hand side with running 2-0 PDS suture. I took care to make sure that the Penrose drain did not get sewn in end at the end of the closure of each corpus cavernosum I pulled the Penrose a short way out to be sure it was in fact not sutured in. I then irrigated the scrotum once again, checked for any bleeding points and none were noted. I then closed the deep tissue of the scrotum with running 3-0 chromic suture and closed the scrotal skin with running 3-0 chromic. I sewed the end of the Penrose drains together so that they could not migrate proximally and applied Neosporin to the incision as well as sterile gauze, fluff gauze and then wrapped this with a Curlex. The patient was then awakened and taken to the recovery room in stable and satisfactory condition. He tolerated procedure well  with no intraoperative complications. Needle, sponge and instrument counts were correct x2 at the end of the operation.    PLAN OF CARE: He be discharged home on the doxycycline that he had been taking preoperatively with antibiotic changes made based on his intraoperative cultures performed today.   PATIENT DISPOSITION:  PACU - hemodynamically stable.

## 2013-05-27 NOTE — H&P (Signed)
[name] is a 68 year old male seen in follow-up for urinary retention.   History of Present Illness Organic erectile dysfunction: He saw Dr. Janice [name] for the same years ago. He had failed all three medications as well as intracorporal injection therapy and MUSE. It appears that Dr. Janice [name] injected the patient on 2 occasions but he does not wish to do injections. He had partial erections in 2003 with Viagra 100 mg. He also has tried a vacuum erection device without good response. On 02/08/10 he underwent a Coloplast three-piece inflatable penile prosthesis placement. The device was found to have failed in 11/12.    Urinary retention: He developed urinary retention in 1/15 with 1 L found in the bladder. He was placed on maximum medical management and passed his voiding trial.  Current therapy: tamsulosin and finasteride     Interval history: He is voiding without difficulty at this time. He was concerned because it appeared there was a portion of the prosthesis that was protruding from the end of his penis. He is not having any fever or constitutional symptoms. He also reports is not interfering with voiding nor is he having any purulent discharge. His penis and scrotum are not tender.       Past Medical History Problems  1. Former smoker (V15.82) 2. History of diabetes mellitus (V12.29) 3. History of hypertension (V12.59) 4. History of Pulmonary Embolism (V12.51) 5. History of Urinary retention (788.20)  Surgical History Problems  1. History of CABG (CABG) 2. History of Surg Penis Insertion Of Penile Prosthesis 3. History of Total Hip Replacement  Current Meds 1. Aspirin EC 325 MG Oral Tablet Delayed Release;  Therapy: (Recorded:17Oct2013) to Recorded 2. Doxazosin Mesylate 8 MG Oral Tablet;  Therapy: (Recorded:03Oct2011) to Recorded 3. Enalapril Maleate 10 MG Oral Tablet;  Therapy: (Recorded:03Oct2011) to Recorded 4. Finasteride 5 MG Oral Tablet; Take 1 tablet by mouth every day;  Last Rx:29Jan2015  Ordered 5. Glimepiride 4 MG Oral Tablet;  Therapy: (Recorded:03Oct2011) to Recorded 6. Lovastatin 20 MG Oral Tablet;  Therapy: (Recorded:03Oct2011) to Recorded 7. MetFORMIN HCl - 1000 MG Oral Tablet;  Therapy: (Recorded:03Oct2011) to Recorded 8. Nitrostat 0.4 MG Sublingual Tablet Sublingual;  Therapy: (Recorded:17Oct2013) to Recorded 9. Pantoprazole Sodium 40 MG Oral Tablet Delayed Release;  Therapy: [id number] to Recorded 10. Tamsulosin HCl - 0.4 MG Oral Capsule; TAKE ONE CAPSULE DAILY TO FACILITATE   STONE PASSAGE;   Therapy: [id number] to (Evaluate:24Jan2016)  Requested for: 29Jan2015; Last   Rx:29Jan2015 Ordered 11. Vitamin D 1000 UNIT Oral Capsule;   Therapy: (Recorded:09Nov2011) to Recorded  Allergies Medication  1. Hydrocodone-Acetaminophen TABS  Family History Problems  1. Family history of malignant neoplasm (V16.9) : Mother 2. Family history of No Significant Family History  Social History Problems  1. Denied: History of Alcohol Use 2. Denied: History of Caffeine Use 3. Former smoker (V15.82) 4. Marital History - Currently Married 5. Occupation: Retired  Electronics engineer, constitutional, skin, eye, otolaryngeal, hematologic/lymphatic, cardiovascular, pulmonary, endocrine, musculoskeletal, neurological and psychiatric system(s) were reviewed and pertinent findings if present are noted.  Gastrointestinal: heartburn.   Vital signs  Blood Pressure: 139 / 68 Temperature: 98.3 F Heart Rate: 72   Physical Exam Constitutional: Well nourished and well developed. No acute distress.  ENT:. The ears and nose are normal in appearance.  Neck: The appearance of the neck is normal and no neck mass is present.  Pulmonary: No respiratory distress and normal respiratory rhythm and effort.  Cardiovascular: Heart rate and rhythm are normal. No peripheral  edema.  Abdomen: The abdomen is soft and nontender. No masses are palpated. No CVA  tenderness. No hernias are palpable. No hepatosplenomegaly noted.  Genitourinary: He has a penile prosthesis in place and it is nontender however protruding through the urethral meatus is the tip of what appears to be the right cylinder.  No scrotal swelling or tenderness is noted over the pump.     On rectal examination he had a 60 gram benign feeling prostate.  Lymphatics: The femoral and inguinal nodes are not enlarged or tender.  Skin: Normal skin turgor, no visible rash and no visible skin lesions.  Neuro/Psych:. Mood and affect are appropriate.    Results/Data  Ultrasound PVR as below. He appears to be emptying his bladder adequately.  PVR: Ultrasound PVR 43 ml.    Assessment Assessed  I discussed with the patient and his wife the fact that I would like him to remain on both his finasteride and tamsulosin.    It appears that his prosthesis has eroded through the glans medially at the level of the urethral meatus. There is no evidence of active infection. I am going to place him on antibiotic therapy in preparation for surgery and we then discussed the need for removal of the device. Because of this situation I did not recommend placing a new prosthesis but we did talk about allowing this to heal completely and then possibly replacing the prosthesis at that time. I went over the incision used, the outpatient nature of the procedure and the fact that I want him on antibiotics to prevent infection from occurring.    Plan Displacement of penile prosthesis implant  1. Follow-up Schedule Surgery Office  Follow-up  Status: Hold For - Appointment   Requested for: [phone] Displacement of penile prosthesis implant, Mechanical complication of genitourinary device, implant, and graft  2. Start: Doxycycline Hyclate 100 MG Oral Capsule; TAKE ONE CAPSULE BY MOUTH  TWICE A DAY  1. Doxycycline.  2. He is be scheduled for removal of his eroded penile prosthesis.

## 2013-05-27 NOTE — Anesthesia Procedure Notes (Signed)
Procedure Name: LMA Insertion Date/Time: 05/27/2013 8:55 AM Performed by: Mechele Claude Pre-anesthesia Checklist: Patient identified, Emergency Drugs available, Suction available and Patient being monitored Patient Re-evaluated:Patient Re-evaluated prior to inductionOxygen Delivery Method: Circle System Utilized Preoxygenation: Pre-oxygenation with 100% oxygen Intubation Type: IV induction Ventilation: Mask ventilation without difficulty LMA: LMA inserted LMA Size: 5.0 Number of attempts: 1 Airway Equipment and Method: bite block Placement Confirmation: positive ETCO2 Tube secured with: Tape Dental Injury: Teeth and Oropharynx as per pre-operative assessment

## 2013-05-28 ENCOUNTER — Encounter (HOSPITAL_BASED_OUTPATIENT_CLINIC_OR_DEPARTMENT_OTHER): Payer: Self-pay | Admitting: Urology

## 2013-05-28 NOTE — Anesthesia Postprocedure Evaluation (Signed)
  Anesthesia Post-op Note  Patient: Johnny [name]  Procedure(s) Performed: Procedure(s) (LRB): CYSTO REMOVAL OF PENILE PROSTHESIS (N/A)  Patient Location: PACU  Anesthesia Type: General  Level of Consciousness: awake and alert   Airway and Oxygen Therapy: Patient Spontanous Breathing  Post-op Pain: mild  Post-op Assessment: Post-op Vital signs reviewed, Patient's Cardiovascular Status Stable, Respiratory Function Stable, Patent Airway and No signs of Nausea or vomiting  Last Vitals:  Filed Vitals:   05/27/13 1425  BP: 160/90  Pulse: 58  Temp: 36.4 C  Resp: 16    Post-op Vital Signs: stable   Complications: No apparent anesthesia complications

## 2013-05-29 LAB — WOUND CULTURE: Culture: NO GROWTH

## 2013-06-01 LAB — ANAEROBIC CULTURE

## 2013-06-03 ENCOUNTER — Encounter: Payer: Self-pay | Admitting: Internal Medicine

## 2013-06-03 ENCOUNTER — Other Ambulatory Visit (INDEPENDENT_AMBULATORY_CARE_PROVIDER_SITE_OTHER): Payer: Medicare Other

## 2013-06-03 ENCOUNTER — Ambulatory Visit (INDEPENDENT_AMBULATORY_CARE_PROVIDER_SITE_OTHER): Payer: Medicare Other | Admitting: Internal Medicine

## 2013-06-03 VITALS — BP 140/74 | HR 80 | Temp 98.2°F | Resp 16 | Wt 256.0 lb

## 2013-06-03 DIAGNOSIS — E785 Hyperlipidemia, unspecified: Secondary | ICD-10-CM

## 2013-06-03 DIAGNOSIS — I259 Chronic ischemic heart disease, unspecified: Secondary | ICD-10-CM

## 2013-06-03 DIAGNOSIS — Z23 Encounter for immunization: Secondary | ICD-10-CM

## 2013-06-03 DIAGNOSIS — E119 Type 2 diabetes mellitus without complications: Secondary | ICD-10-CM

## 2013-06-03 DIAGNOSIS — F528 Other sexual dysfunction not due to a substance or known physiological condition: Secondary | ICD-10-CM

## 2013-06-03 LAB — BASIC METABOLIC PANEL
BUN: 9 mg/dL (ref 6–23)
CALCIUM: 9.3 mg/dL (ref 8.4–10.5)
CO2: 29 mEq/L (ref 19–32)
Chloride: 103 mEq/L (ref 96–112)
Creatinine, Ser: 0.9 mg/dL (ref 0.4–1.5)
GFR: 107.72 mL/min (ref >60.00)
GLUCOSE: 100 mg/dL — AB (ref 70–99)
Potassium: 4 mEq/L (ref 3.5–5.1)
SODIUM: 139 meq/L (ref 135–145)

## 2013-06-03 LAB — HEMOGLOBIN A1C: Hgb A1c MFr Bld: 7.4 % — ABNORMAL HIGH (ref 4.6–6.5)

## 2013-06-03 NOTE — Assessment & Plan Note (Signed)
Continue with current prescription therapy as reflected on the Med list.  

## 2013-06-03 NOTE — Assessment & Plan Note (Signed)
  S/p penile implant removal due to leaking Dr Karsten Ro 3/15

## 2013-06-03 NOTE — Progress Notes (Signed)
Pre visit review using our clinic review tool, if applicable. No additional management support is needed unless otherwise documented below in the visit note. 

## 2013-06-04 ENCOUNTER — Ambulatory Visit: Payer: Medicare Other | Admitting: Internal Medicine

## 2013-06-04 NOTE — Progress Notes (Signed)
   Subjective:    HPI  The patient presents for a follow-up of  chronic hypertension, chronic dyslipidemia, type 2 diabetes controlled with medicines. C/o ED, penile pump was removed 3/15 S/p R THR - 01/30/13    Wt Readings from Last 3 Encounters:  06/03/13 256 lb (116.121 kg)  05/27/13 253 lb (114.76 kg)  05/27/13 253 lb (114.76 kg)   BP Readings from Last 3 Encounters:  06/03/13 140/74  05/27/13 160/90  05/27/13 160/90      Review of Systems  Constitutional: Negative for appetite change, fatigue and unexpected weight change.  HENT: Negative for congestion, nosebleeds, sneezing and sore throat.   Eyes: Negative for itching and visual disturbance.  Respiratory: Negative for cough and shortness of breath.   Cardiovascular: Negative for palpitations and leg swelling.  Gastrointestinal: Negative for nausea, diarrhea, blood in stool and abdominal distention.  Genitourinary: Negative for frequency and hematuria.  Musculoskeletal: Negative for back pain, gait problem and joint swelling.  Skin: Negative for rash.  Neurological: Negative for dizziness, tremors and speech difficulty.  Psychiatric/Behavioral: Negative for sleep disturbance, dysphoric mood and agitation. The patient is not nervous/anxious.        Objective:   Physical Exam  Constitutional: He is oriented to person, place, and time. He appears well-developed.  Obese   HENT:  Mouth/Throat: Oropharynx is clear and moist.  Eyes: Conjunctivae are normal. Pupils are equal, round, and reactive to light.  Neck: Normal range of motion. No JVD present. No thyromegaly present.  Cardiovascular: Normal rate, regular rhythm, normal heart sounds and intact distal pulses.  Exam reveals no gallop and no friction rub.   No murmur heard. Pulmonary/Chest: Effort normal and breath sounds normal. No respiratory distress. He has no wheezes. He has no rales. He exhibits no tenderness.  Abdominal: Soft. Bowel sounds are normal. He  exhibits no distension and no mass. There is no tenderness. There is no rebound and no guarding.  Musculoskeletal: Normal range of motion. He exhibits no edema and no tenderness.  Lymphadenopathy:    He has no cervical adenopathy.  Neurological: He is alert and oriented to person, place, and time. He has normal reflexes. No cranial nerve deficit. He exhibits normal muscle tone. Coordination normal.  Skin: Skin is warm and dry. No rash noted.  Psychiatric: He has a normal mood and affect. His behavior is normal. Judgment and thought content normal.  R hip is not tender; Cane Wound is dressed  Lab Results  Component Value Date   WBC 9.7 02/01/2013   HGB 12.2* 05/27/2013   HCT 36.0* 05/27/2013   PLT 244 02/01/2013   GLUCOSE 100* 06/03/2013   CHOL 136 07/14/2011   TRIG 70.0 07/14/2011   HDL 32.50* 07/14/2011   LDLDIRECT 78.1 09/16/2008   LDLCALC 90 07/14/2011   ALT 13 01/21/2013   AST 13 01/21/2013   NA 139 06/03/2013   K 4.0 06/03/2013   CL 103 06/03/2013   CREATININE 0.9 06/03/2013   BUN 9 06/03/2013   CO2 29 06/03/2013   TSH 3.59 10/14/2010   PSA 1.28 10/24/2008   INR 1.15 01/21/2013   HGBA1C 7.4* 06/03/2013        Assessment & Plan:

## 2013-06-21 ENCOUNTER — Other Ambulatory Visit: Payer: Self-pay

## 2013-06-25 ENCOUNTER — Ambulatory Visit: Payer: Medicare Other | Admitting: Internal Medicine

## 2013-06-25 ENCOUNTER — Encounter: Payer: Self-pay | Admitting: Internal Medicine

## 2013-06-25 ENCOUNTER — Ambulatory Visit (INDEPENDENT_AMBULATORY_CARE_PROVIDER_SITE_OTHER): Payer: Medicare Other | Admitting: Internal Medicine

## 2013-06-25 VITALS — BP 139/73 | HR 79 | Ht 71.0 in | Wt 249.8 lb

## 2013-06-25 DIAGNOSIS — I259 Chronic ischemic heart disease, unspecified: Secondary | ICD-10-CM

## 2013-06-25 DIAGNOSIS — E785 Hyperlipidemia, unspecified: Secondary | ICD-10-CM

## 2013-06-25 NOTE — Patient Instructions (Signed)
Your physician wants you to follow-up in: 12 months with Dr. Taylor. You will receive a reminder letter in the mail two months in advance. If you don't receive a letter, please call our office to schedule the follow-up appointment.    

## 2013-06-25 NOTE — Progress Notes (Signed)
HPI Mr. Dinh returns today for followup. He is a very pleasant 68 year old man with a history of hypertension, coronary disease, dyslipidemia, and diabetes. The patient has been increasing his physical activity and trying to lose weight. He has undergone hip replacement surgery and has done well. He denies chest pain or shortness of breath. No peripheral edema. No syncope. In the interim, the patient states that he is been doing aerobic activity. He admits to dietary indiscretion.  Allergies  Allergen Reactions  . Hydrocodone-Acetaminophen Nausea Only     Current Outpatient Prescriptions  Medication Sig Dispense Refill  . aspirin 325 MG EC tablet Take 325 mg by mouth every morning.      . cholecalciferol (VITAMIN D) 1000 UNITS tablet Take 1,000 Units by mouth every morning.      [name] doxazosin (CARDURA) 8 MG tablet Take 8 mg by mouth every morning.      . enalapril (VASOTEC) 10 MG tablet Take 1 tablet (10 mg total) by mouth daily with breakfast.  90 tablet  3  . finasteride (PROSCAR) 5 MG tablet Take 5 mg by mouth every morning.      [name] glimepiride (AMARYL) 4 MG tablet Take 4 mg by mouth daily with breakfast.      . HYDROmorphone (DILAUDID) 2 MG tablet Take 1 tablet (2 mg total) by mouth every 4 (four) hours as needed for severe pain.  30 tablet  0  . lovastatin (MEVACOR) 40 MG tablet Take 40 mg by mouth every morning.      . metFORMIN (GLUCOPHAGE) 1000 MG tablet Take 1 tablet (1,000 mg total) by mouth 2 (two) times daily with a meal.  180 tablet  3  . nitroGLYCERIN (NITROSTAT) 0.4 MG SL tablet Place 1 tablet (0.4 mg total) under the tongue every 5 (five) minutes as needed for chest pain.  15 tablet  3  . pantoprazole (PROTONIX) 40 MG tablet Take 40 mg by mouth every morning.      . tamsulosin (FLOMAX) 0.4 MG CAPS capsule daily.       No current facility-administered medications for this visit.     Past Medical History  Diagnosis Date  . Benign prostatic hypertrophy   . Arthritis   .  Benign positional vertigo   . Chronic ischemic heart disease, unspecified   . Hyperlipidemia   . HTN (hypertension)   . Type 2 diabetes mellitus   . GERD (gastroesophageal reflux disease)   . CAD (coronary artery disease)     CARDIOLOGIST--  DR Cristopher Peru  . ED (erectile dysfunction) of organic origin   . Wears glasses   . History of CHF (congestive heart failure)     systolic  . At risk for sleep apnea     STOP-BANG=  6   SENT TO PCP 05-21-2013    ROS:   All systems reviewed and negative except as noted in the HPI.   Past Surgical History  Procedure Laterality Date  . Penile prosthesis implant  02-08-2010    COLOPLAST 3-PIECE INFLATABLE  . Total hip arthroplasty  01/24/2011    Procedure: TOTAL HIP ARTHROPLASTY;  Surgeon: Dione Plover [name];  Location: [facility];  Service: Orthopedics;  Laterality: Left;  . Total hip arthroplasty Right 01/30/2013    Procedure: RIGHT TOTAL HIP ARTHROPLASTY;  Surgeon: Gearlean Alf, MD;  Location: [facility];  Service: Orthopedics;  Laterality: Right;  . Cataract extraction w/ intraocular lens implant Right 2011  . Coronary artery bypass graft  03-06-2000   DR  GERHARDT    third vessel  . Cardiovascular stress test  08-09-2010  DR Carleene Overlie TAYLOR    normal lexiscan nuclear study/  no ischemia/  normal lvf/  ef 54%  . Cardiac catheterization  03-03-2000  dr gregg taylor    normal lvsf/  two-vessel cad significant complex stenosis at distal left main  . Penile prosthesis implant N/A 05/27/2013    Procedure: CYSTO REMOVAL OF PENILE PROSTHESIS;  Surgeon: Claybon Jabs, MD;  Location: [facility];  Service: Urology;  Laterality: N/A;     Family History  Problem Relation Age of Onset  . Coronary artery disease Other     1st degree male relative  . Hypertension Other   . Hypertension Mother   . Diabetes Father      History   Social History  . Marital Status: Married    Spouse Name: N/A    Number of Children: N/A  . Years of  Education: N/A   Occupational History  . Preacher    Social History Main Topics  . Smoking status: Former Smoker    Types: Cigarettes    Quit date: 05/22/1970  . Smokeless tobacco: Never Used  . Alcohol Use: No  . Drug Use: No  . Sexual Activity: Not on file   Other Topics Concern  . Not on file   Social History Narrative   Regular Exercise-No           BP 139/73  Pulse 79  Ht 5\' 11"  (1.803 m)  Wt 249 lb 12.8 oz (113.309 kg)  BMI 34.86 kg/m2  Physical Exam:  Well appearing middle-aged man, NAD HEENT: Unremarkable Neck:  7 cm JVD, no thyromegally Lungs:  Clear with no wheezes, rales, or rhonchi. HEART:  Regular rate rhythm, no murmurs, no rubs, no clicks Abd:  soft, positive bowel sounds, no organomegally, no rebound, no guarding Ext:  2 plus pulses, no edema, no cyanosis, no clubbing Skin:  No rashes no nodules Neuro:  CN II through XII intact, motor grossly intact   Assess/Plan:

## 2013-06-25 NOTE — Assessment & Plan Note (Signed)
His weight is unchanged. He notes that his DM has been better controlled. He will continue his statin therapy and I have encouraged him to lose weight.

## 2013-06-25 NOTE — Assessment & Plan Note (Signed)
He denies anginal symptoms. He will continue his regular daily exercise.

## 2013-07-02 ENCOUNTER — Other Ambulatory Visit: Payer: Self-pay | Admitting: *Deleted

## 2013-07-02 MED ORDER — ENALAPRIL MALEATE 10 MG PO TABS: 10.0000 mg | ORAL_TABLET | Freq: Every day | ORAL | Status: DC

## 2013-07-02 MED ORDER — GLIMEPIRIDE 4 MG PO TABS: 4.0000 mg | ORAL_TABLET | Freq: Every day | ORAL | Status: DC

## 2013-07-02 MED ORDER — LOVASTATIN 40 MG PO TABS: 40.0000 mg | ORAL_TABLET | Freq: Every morning | ORAL | Status: DC

## 2013-07-02 MED ORDER — PANTOPRAZOLE SODIUM 40 MG PO TBEC: 40.0000 mg | DELAYED_RELEASE_TABLET | Freq: Every morning | ORAL | Status: DC

## 2013-07-02 MED ORDER — DOXAZOSIN MESYLATE 8 MG PO TABS: 8.0000 mg | ORAL_TABLET | Freq: Every morning | ORAL | Status: DC

## 2013-07-02 MED ORDER — METFORMIN HCL 1000 MG PO TABS: 1000.0000 mg | ORAL_TABLET | Freq: Two times a day (BID) | ORAL | Status: DC

## 2013-07-17 ENCOUNTER — Other Ambulatory Visit: Payer: Self-pay | Admitting: Internal Medicine

## 2013-07-20 ENCOUNTER — Telehealth: Payer: Self-pay | Admitting: Physician Assistant

## 2013-07-20 ENCOUNTER — Emergency Department (HOSPITAL_COMMUNITY)
Admission: EM | Admit: 2013-07-20 | Discharge: 2013-07-20 | Disposition: A | Discharge status: Home / Self Care | Payer: Medicare Other | Attending: Emergency Medicine | Admitting: Emergency Medicine

## 2013-07-20 ENCOUNTER — Emergency Department (HOSPITAL_COMMUNITY): Payer: Medicare Other

## 2013-07-20 ENCOUNTER — Encounter (HOSPITAL_COMMUNITY): Payer: Self-pay | Admitting: Emergency Medicine

## 2013-07-20 DIAGNOSIS — E119 Type 2 diabetes mellitus without complications: Secondary | ICD-10-CM | POA: Insufficient documentation

## 2013-07-20 DIAGNOSIS — I5020 Unspecified systolic (congestive) heart failure: Secondary | ICD-10-CM | POA: Insufficient documentation

## 2013-07-20 DIAGNOSIS — Z79899 Other long term (current) drug therapy: Secondary | ICD-10-CM | POA: Insufficient documentation

## 2013-07-20 DIAGNOSIS — I2510 Atherosclerotic heart disease of native coronary artery without angina pectoris: Secondary | ICD-10-CM | POA: Insufficient documentation

## 2013-07-20 DIAGNOSIS — R0602 Shortness of breath: Secondary | ICD-10-CM

## 2013-07-20 DIAGNOSIS — K219 Gastro-esophageal reflux disease without esophagitis: Secondary | ICD-10-CM | POA: Insufficient documentation

## 2013-07-20 DIAGNOSIS — Z9889 Other specified postprocedural states: Secondary | ICD-10-CM | POA: Insufficient documentation

## 2013-07-20 DIAGNOSIS — E785 Hyperlipidemia, unspecified: Secondary | ICD-10-CM | POA: Insufficient documentation

## 2013-07-20 DIAGNOSIS — Z789 Other specified health status: Secondary | ICD-10-CM | POA: Insufficient documentation

## 2013-07-20 DIAGNOSIS — Z951 Presence of aortocoronary bypass graft: Secondary | ICD-10-CM | POA: Insufficient documentation

## 2013-07-20 DIAGNOSIS — N400 Enlarged prostate without lower urinary tract symptoms: Secondary | ICD-10-CM | POA: Insufficient documentation

## 2013-07-20 DIAGNOSIS — Z87891 Personal history of nicotine dependence: Secondary | ICD-10-CM | POA: Insufficient documentation

## 2013-07-20 DIAGNOSIS — Z8669 Personal history of other diseases of the nervous system and sense organs: Secondary | ICD-10-CM | POA: Insufficient documentation

## 2013-07-20 DIAGNOSIS — I10 Essential (primary) hypertension: Secondary | ICD-10-CM | POA: Insufficient documentation

## 2013-07-20 DIAGNOSIS — Z7982 Long term (current) use of aspirin: Secondary | ICD-10-CM | POA: Insufficient documentation

## 2013-07-20 DIAGNOSIS — M129 Arthropathy, unspecified: Secondary | ICD-10-CM | POA: Insufficient documentation

## 2013-07-20 DIAGNOSIS — J40 Bronchitis, not specified as acute or chronic: Secondary | ICD-10-CM | POA: Insufficient documentation

## 2013-07-20 LAB — CBC
HCT: 35.3 % — ABNORMAL LOW (ref 39.0–52.0)
Hemoglobin: 11.4 g/dL — ABNORMAL LOW (ref 13.0–17.0)
MCH: 26.4 pg (ref 26.0–34.0)
MCHC: 32.3 g/dL (ref 30.0–36.0)
MCV: 81.7 fL (ref 78.0–100.0)
Platelets: 258 10*3/uL (ref 150–400)
RBC: 4.32 MIL/uL (ref 4.22–5.81)
RDW: 16.1 % — ABNORMAL HIGH (ref 11.5–15.5)
WBC: 5.8 10*3/uL (ref 4.0–10.5)

## 2013-07-20 LAB — BASIC METABOLIC PANEL
BUN: 8 mg/dL (ref 6–23)
CO2: 26 mEq/L (ref 19–32)
Calcium: 9.1 mg/dL (ref 8.4–10.5)
Chloride: 103 mEq/L (ref 96–112)
Creatinine, Ser: 0.78 mg/dL (ref 0.50–1.35)
GLUCOSE: 150 mg/dL — AB (ref 70–99)
Potassium: 3.9 mEq/L (ref 3.7–5.3)
Sodium: 141 mEq/L (ref 137–147)

## 2013-07-20 LAB — I-STAT TROPONIN, ED: TROPONIN I, POC: 0 ng/mL (ref 0.00–0.08)

## 2013-07-20 LAB — PRO B NATRIURETIC PEPTIDE: Pro B Natriuretic peptide (BNP): 116.3 pg/mL (ref 0–125)

## 2013-07-20 LAB — D-DIMER, QUANTITATIVE: D-Dimer, Quant: 2.55 ug/mL-FEU — ABNORMAL HIGH (ref 0.00–0.48)

## 2013-07-20 MED ORDER — ALBUTEROL SULFATE HFA 108 (90 BASE) MCG/ACT IN AERS: 2.0000 | INHALATION_SPRAY | RESPIRATORY_TRACT | Status: DC | PRN

## 2013-07-20 MED ORDER — PREDNISONE 20 MG PO TABS: 20.0000 mg | ORAL_TABLET | Freq: Every day | ORAL | Status: DC

## 2013-07-20 MED ORDER — GUAIFENESIN ER 1200 MG PO TB12: 1.0000 | ORAL_TABLET | Freq: Two times a day (BID) | ORAL | Status: DC

## 2013-07-20 MED ORDER — ALBUTEROL SULFATE (2.5 MG/3ML) 0.083% IN NEBU
5.0000 mg | INHALATION_SOLUTION | Freq: Once | RESPIRATORY_TRACT | Status: AC
  Administered 2013-07-20: 5 mg via RESPIRATORY_TRACT
  Filled 2013-07-20: qty 6

## 2013-07-20 MED ORDER — IOHEXOL 350 MG/ML SOLN
100.0000 mL | Freq: Once | INTRAVENOUS | Status: AC | PRN
Start: 2013-07-20 — End: 2013-07-20
  Administered 2013-07-20: 100 mL via INTRAVENOUS

## 2013-07-20 NOTE — ED Provider Notes (Signed)
CSN: [id number]     Arrival date & time 07/20/13  0848 History   First MD Initiated Contact with Patient 07/20/13 [phone]     Chief Complaint  Patient presents with  . Shortness of Breath     (Consider location/radiation/quality/duration/timing/severity/associated sxs/prior Treatment) HPI Patient presents to the emergency department with shortness of breath and wheezing for the last month.  The patient, states, that he has not had any chest pain, nausea, vomiting, diarrhea, abdominal pain, back pain, neck pain, rash, fever, weakness, dizziness, headache, blurred vision, polydipsia decreased urine output, dysuria, numbness, nasal congestion, runny nose, sore throat, or syncope.  Patient, states, that he's had some mild cough, as well.  Patient, states he did not take any medication prior to arrival.  Patient, states the symptoms have been persistent.  Patient, states, that when he lies down.  He seems to get a little more short of breath. Past Medical History  Diagnosis Date  . Benign prostatic hypertrophy   . Arthritis   . Benign positional vertigo   . Chronic ischemic heart disease, unspecified   . Hyperlipidemia   . HTN (hypertension)   . Type 2 diabetes mellitus   . GERD (gastroesophageal reflux disease)   . CAD (coronary artery disease)     CARDIOLOGIST--  DR Cristopher Peru  . ED (erectile dysfunction) of organic origin   . Wears glasses   . History of CHF (congestive heart failure)     systolic  . At risk for sleep apnea     STOP-BANG=  6   SENT TO PCP 05-21-2013   Past Surgical History  Procedure Laterality Date  . Penile prosthesis implant  02-08-2010    COLOPLAST 3-PIECE INFLATABLE  . Total hip arthroplasty  01/24/2011    Procedure: TOTAL HIP ARTHROPLASTY;  Surgeon: Dione Plover [name];  Location: [facility];  Service: Orthopedics;  Laterality: Left;  . Total hip arthroplasty Right 01/30/2013    Procedure: RIGHT TOTAL HIP ARTHROPLASTY;  Surgeon: Gearlean Alf, MD;  Location: [facility];  Service: Orthopedics;  Laterality: Right;  . Cataract extraction w/ intraocular lens implant Right 2011  . Coronary artery bypass graft  03-06-2000   DR [id number]    third vessel  . Cardiovascular stress test  08-09-2010  DR Carleene Overlie TAYLOR    normal lexiscan nuclear study/  no ischemia/  normal lvf/  ef 54%  . Cardiac catheterization  03-03-2000  dr gregg taylor    normal lvsf/  two-vessel cad significant complex stenosis at distal left main  . Penile prosthesis implant N/A 05/27/2013    Procedure: CYSTO REMOVAL OF PENILE PROSTHESIS;  Surgeon: Claybon Jabs, MD;  Location: [facility];  Service: Urology;  Laterality: N/A;   Family History  Problem Relation Age of Onset  . Coronary artery disease Other     1st degree male relative  . Hypertension Other   . Hypertension Mother   . Diabetes Father    History  Substance Use Topics  . Smoking status: Former Smoker    Types: Cigarettes    Quit date: 05/22/1970  . Smokeless tobacco: Never Used  . Alcohol Use: No    Review of Systems  All other systems negative except as documented in the HPI. All pertinent positives and negatives as reviewed in the HPI.  Allergies  Hydrocodone-acetaminophen  Home Medications   Prior to Admission medications   Medication Sig Start Date End Date Taking? Authorizing Provider  aspirin 325 MG EC tablet Take 325  mg by mouth every morning.   Yes Historical Provider, MD  cholecalciferol (VITAMIN D) 1000 UNITS tablet Take 1,000 Units by mouth every morning.   Yes Historical Provider, MD  doxazosin (CARDURA) 8 MG tablet Take 1 tablet (8 mg total) by mouth every morning. 07/02/13  Yes Evie Lacks Plotnikov, MD  enalapril (VASOTEC) 10 MG tablet Take 1 tablet (10 mg total) by mouth daily with breakfast. 07/02/13  Yes Evie Lacks Plotnikov, MD  finasteride (PROSCAR) 5 MG tablet Take 5 mg by mouth every morning.   Yes Historical Provider, MD  glimepiride (AMARYL) 4 MG tablet Take 1 tablet (4 mg  total) by mouth daily with breakfast. 07/02/13  Yes Evie Lacks Plotnikov, MD  lovastatin (MEVACOR) 40 MG tablet Take 1 tablet (40 mg total) by mouth every morning. 07/02/13  Yes Cassandria Anger, MD  metFORMIN (GLUCOPHAGE) 1000 MG tablet Take 1 tablet (1,000 mg total) by mouth 2 (two) times daily with a meal. 07/02/13  Yes Evie Lacks Plotnikov, MD  pantoprazole (PROTONIX) 40 MG tablet Take 1 tablet (40 mg total) by mouth every morning. 07/02/13 07/02/14 Yes Evie Lacks Plotnikov, MD  tamsulosin (FLOMAX) 0.4 MG CAPS capsule Take 0.4 mg by mouth daily.  05/14/13  Yes Historical Provider, MD  nitroGLYCERIN (NITROSTAT) 0.4 MG SL tablet Place 1 tablet (0.4 mg total) under the tongue every 5 (five) minutes as needed for chest pain. 01/24/13   Evie Lacks Plotnikov, MD   BP 159/65  Pulse 65  Temp(Src) 98.3 F (36.8 C) (Oral)  Resp 16  SpO2 98% Physical Exam  Nursing note and vitals reviewed. Constitutional: He is oriented to person, place, and time. He appears well-developed and well-nourished. No distress.  HENT:  Head: Normocephalic and atraumatic.  Mouth/Throat: Oropharynx is clear and moist. No oropharyngeal exudate.  Eyes: Pupils are equal, round, and reactive to light.  Neck: Normal range of motion. Neck supple.  Cardiovascular: Normal rate, regular rhythm and normal heart sounds.  Exam reveals no gallop and no friction rub.   No murmur heard. Pulmonary/Chest: Effort normal and breath sounds normal. No respiratory distress. He has no wheezes. He has no rales.  Abdominal: Soft. Bowel sounds are normal.  Musculoskeletal: He exhibits no edema.  Neurological: He is alert and oriented to person, place, and time. He exhibits normal muscle tone. Coordination normal.  Skin: Skin is warm and dry. No rash noted. No erythema.    ED Course  Procedures (including critical care time) Labs Review Labs Reviewed  BASIC METABOLIC PANEL - Abnormal; Notable for the following:    Glucose, Bld 150 (*)    All  other components within normal limits  CBC - Abnormal; Notable for the following:    Hemoglobin 11.4 (*)    HCT 35.3 (*)    RDW 16.1 (*)    All other components within normal limits  D-DIMER, QUANTITATIVE - Abnormal; Notable for the following:    D-Dimer, Quant 2.55 (*)    All other components within normal limits  PRO B NATRIURETIC PEPTIDE  I-STAT TROPOININ, ED    Imaging Review Dg Chest 2 View (if Patient Has Fever And/or Copd)  07/20/2013   CLINICAL DATA:  Shortness of breath  EXAM: CHEST  2 VIEW  COMPARISON:  01/21/2013  FINDINGS: No cardiomegaly. Previous median sternotomy for CABG. Mild interstitial coarsening, including occasional Kerley B-lines. No consolidation, effusion, or pneumothorax.  IMPRESSION: Interstitial coarsening which could be congestive or bronchitic.   Electronically Signed   By: Jorje Guild  M.D.   On: 07/20/2013 09:30   Ct Angio Chest Pe W/cm &/or Wo Cm  07/20/2013   CLINICAL DATA:  Shortness of Breath  EXAM: CT ANGIOGRAPHY CHEST WITH CONTRAST  TECHNIQUE: Multidetector CT imaging of the chest was performed using the standard protocol during bolus administration of intravenous contrast. Multiplanar CT image reconstructions and MIPs were obtained to evaluate the vascular anatomy.  CONTRAST:  180mL OMNIPAQUE IOHEXOL 350 MG/ML SOLN  COMPARISON:  Chest radiograph Jul 20, 2013  FINDINGS: There is no demonstrable pulmonary embolus. There is no evidence of thoracic aortic aneurysm. There is atherosclerotic change in the aorta.  There is mild bibasilar atelectatic change. No edema or consolidation.  There is no appreciable thoracic adenopathy. Pericardium is not thickened. Patient is status post coronary artery bypass grafting.  There is a small hiatal hernia.  Visualized upper abdominal structures appear within normal limits. There are no blastic or lytic bone lesions. There is degenerative change in the thoracic spine. Thyroid appears within normal limits.  Review of the MIP  images confirms the above findings.  IMPRESSION: No demonstrable pulmonary embolus. No edema or consolidation. Small hiatal hernia. Patient is status post coronary artery bypass grafting.   Electronically Signed   By: Lowella Grip M.D.   On: 07/20/2013 11:28     EKG Interpretation   Date/Time:  Saturday Jul 20 2013 09:00:46 EDT Ventricular Rate:  64 PR Interval:  136 QRS Duration: 83 QT Interval:  408 QTC Calculation: 421 R Axis:   26 Text Interpretation:  Sinus rhythm Anteroseptal infarct, old Baseline  wander in lead(s) II III aVF V4 Poor data quality Confirmed by RAY MD,  Andee Poles [phone]) on 07/20/2013 9:09:53 AM      The patient does not have any signs of congestive heart failure noted on his testing here today or exam.  I spoke with the on-call cardiologist for his Dr. and he reviewed the testing, and felt that close followup would be okay.  Patient's been stable here in the emergency department.  He did have some mild wheezing noted in the bases, but decreased breath sounds overall in the bases.  The patient has not had any chest pain, or diaphoresis.  This is nonexertional in nature.  He does describe some mild worsening when he lies flat.  The patient, states he normally does not sleep lying flat anyway.  Patient be asked to followup with his cardiologist soon as possible.  Told to return here for any worsening in his condition.  Advised patient is still could represent an early CHF, but has been ongoing for a month.  It seems to be less likely at this time    Brent General, PA-C 07/20/13 1229

## 2013-07-20 NOTE — Telephone Encounter (Signed)
The patient called in stating he's been getting progressively SOB for the last month.  It is worse when laying down.  He will see PCP today if possible.  Will call for appt in our office for next week.    Instructed to go to ER if it gets any worse.  Tarri Fuller, [facility]

## 2013-07-20 NOTE — ED Provider Notes (Signed)
Medical screening examination/treatment/procedure(s) were conducted as a shared visit with non-physician practitioner(s) and myself.  I personally evaluated the patient during the encounter.   EKG Interpretation   Date/Time:  Saturday Jul 20 2013 09:00:46 EDT Ventricular Rate:  64 PR Interval:  136 QRS Duration: 83 QT Interval:  408 QTC Calculation: 421 R Axis:   26 Text Interpretation:  Sinus rhythm Anteroseptal infarct, old Baseline  wander in lead(s) II III aVF V4 Poor data quality Confirmed by RAY MD,  DANIELLE ([id number]) on 07/20/2013 9:09:53 AM      Pt presents with shortness of breath over the last month.    Sent to ED for further evaluation.  Has not been able to see PCP  Troponin normal.  BNP normal.  D dimer elevated but CT scan negative.  Stable for further outpatient workup.  Possible stress testing, PFTs.  Discussed with cardiology.  Will see him this week.  Kathalene Frames, MD 07/20/13 [phone]

## 2013-07-20 NOTE — ED Notes (Addendum)
Pt c/o SOB, "wheezaling" x 1 month.  Was sent here by PCP because they didn't have any appts open.  Hx of open heart surgery in 1999.  Denies any lung issues.  Pt finishing sentences w/ no obvious distress.  Rt ankle swelling Monday and Tuesday.  Has been getting better.

## 2013-07-20 NOTE — Discharge Instructions (Signed)
Return here as needed.  Followup with your cardiologist at the beginning of next week

## 2013-07-20 NOTE — ED Notes (Signed)
Attempted iv start x2 was unsuccessful, charge rn aware and will try.

## 2013-07-20 NOTE — ED Notes (Signed)
Provided Pt with Eggs, Grits and Cranberry juice, Per PA Laywer

## 2013-07-22 ENCOUNTER — Telehealth: Payer: Self-pay | Admitting: *Deleted

## 2013-07-22 NOTE — Telephone Encounter (Signed)
[facility] Triage Call Report Triage Record Num: [id number] Operator: Vaughan Sine Patient Name: Johnny [name] Call Date & Time: 07/20/2013 7:57:32AM Patient Phone: [phone] PCP: Walker Kehr Patient Gender: Male PCP Fax : [phone] Patient DOB: [birth year] Practice Name: [facility] Reason for Call: ER CALL. Caller: Angie/Spouse; PCP: Plotnikov, Alex (Adults only); CB#: [phone]; Call regarding intermittent SOB, onset 2 months. Wife spoke w/ Cardiologist on 5-16, advised to be seen by [facility]. Pt has appt w/ Cards on 5-18. Pt woke early Am on 5-16, unable to breath well laying down, Pt is only able to blow incentive spriometer half way. Wife denies edema, Chest pain, states he feels tightness no pain, RN hears audible wheeze. FSBS 149 at 8am on 5-16. SOB and Diabetes Respiratory problems protocols used, ED dispo d/t new breathing problems w/ DM. Pt also has hx of blockage w/ stents. No available appts at [facility], advised Wife to have Pt seen at ED. Wife verbalized understanding. Protocol(s) Used: Breathing Problems Protocol(s) Used: Diabetes: Respiratory Problems Recommended Outcome per Protocol: See ED Immediately Reason for Outcome: Diabetes and breathing problems New or worsening breathing problems Care Advice: ~

## 2013-07-23 ENCOUNTER — Ambulatory Visit: Payer: Medicare Other | Admitting: Nurse Practitioner

## 2013-08-07 ENCOUNTER — Other Ambulatory Visit: Payer: Self-pay

## 2013-08-08 ENCOUNTER — Encounter: Payer: Self-pay | Admitting: Internal Medicine

## 2013-08-08 ENCOUNTER — Ambulatory Visit (INDEPENDENT_AMBULATORY_CARE_PROVIDER_SITE_OTHER): Payer: Medicare Other | Admitting: Internal Medicine

## 2013-08-08 VITALS — BP 131/63 | HR 63 | Ht 71.0 in | Wt 252.0 lb

## 2013-08-08 DIAGNOSIS — R079 Chest pain, unspecified: Secondary | ICD-10-CM

## 2013-08-08 NOTE — Patient Instructions (Signed)
Your physician recommends that you schedule a follow-up appointment in: 3 months with Dr Lovena Le   Your physician has requested that you have en exercise stress myoview. For further information please visit [email or web address]. Please follow instruction sheet, as given.

## 2013-08-08 NOTE — Assessment & Plan Note (Signed)
This is a new problem and was not present when I saw the patient several months ago. I have recommended he undergo exercise testing. Will schedule this and make additional recommendations based on the results of his stress testing.

## 2013-08-08 NOTE — Progress Notes (Signed)
HPI [name] returns today for evaluation of chest pain. He is a very pleasant 68 year old man with a history of hypertension, coronary disease, dyslipidemia, and diabetes. The patient has had chest pressure over the past few weeks and sob similar to his symptoms when he underwent CABG in 1999. When he stops exerting himself, his symptoms resolved. They have not occurred at rest. No peripheral edema. No syncope.   Allergies  Allergen Reactions  . Hydrocodone-Acetaminophen Nausea Only     Current Outpatient Prescriptions  Medication Sig Dispense Refill  . albuterol (PROVENTIL HFA;VENTOLIN HFA) 108 (90 BASE) MCG/ACT inhaler Inhale 2 puffs into the lungs every 4 (four) hours as needed for wheezing or shortness of breath.  3.7 g  0  . aspirin 325 MG EC tablet Take 325 mg by mouth every morning.      . cholecalciferol (VITAMIN D) 1000 UNITS tablet Take 1,000 Units by mouth every morning.      [name] doxazosin (CARDURA) 8 MG tablet Take 1 tablet (8 mg total) by mouth every morning.  90 tablet  3  . enalapril (VASOTEC) 10 MG tablet Take 1 tablet (10 mg total) by mouth daily with breakfast.  90 tablet  3  . finasteride (PROSCAR) 5 MG tablet Take 5 mg by mouth every morning.      [name] glimepiride (AMARYL) 4 MG tablet Take 1 tablet (4 mg total) by mouth daily with breakfast.  90 tablet  3  . Guaifenesin 1200 MG TB12 Take 1 tablet (1,200 mg total) by mouth 2 (two) times daily.  20 each  0  . lovastatin (MEVACOR) 40 MG tablet Take 1 tablet (40 mg total) by mouth every morning.  90 tablet  3  . metFORMIN (GLUCOPHAGE) 1000 MG tablet Take 1 tablet (1,000 mg total) by mouth 2 (two) times daily with a meal.  180 tablet  3  . nitroGLYCERIN (NITROSTAT) 0.4 MG SL tablet Place 1 tablet (0.4 mg total) under the tongue every 5 (five) minutes as needed for chest pain.  15 tablet  3  . pantoprazole (PROTONIX) 40 MG tablet Take 1 tablet (40 mg total) by mouth every morning.  90 tablet  3  . predniSONE (DELTASONE) 20 MG tablet  Take 1 tablet (20 mg total) by mouth daily.  5 tablet  0  . tamsulosin (FLOMAX) 0.4 MG CAPS capsule Take 0.4 mg by mouth daily.        No current facility-administered medications for this visit.     Past Medical History  Diagnosis Date  . Benign prostatic hypertrophy   . Arthritis   . Benign positional vertigo   . Chronic ischemic heart disease, unspecified   . Hyperlipidemia   . HTN (hypertension)   . Type 2 diabetes mellitus   . GERD (gastroesophageal reflux disease)   . CAD (coronary artery disease)     CARDIOLOGIST--  Johnny [name]  . ED (erectile dysfunction) of organic origin   . Wears glasses   . History of CHF (congestive heart failure)     systolic  . At risk for sleep apnea     STOP-BANG=  6   SENT TO PCP 05-21-2013    ROS:   All systems reviewed and negative except as noted in the HPI.   Past Surgical History  Procedure Laterality Date  . Penile prosthesis implant  02-08-2010    COLOPLAST 3-PIECE INFLATABLE  . Total hip arthroplasty  01/24/2011    Procedure: TOTAL HIP ARTHROPLASTY;  Surgeon: Johnny Plover  [name];  Location: [facility];  Service: Orthopedics;  Laterality: Left;  . Total hip arthroplasty Right 01/30/2013    Procedure: RIGHT TOTAL HIP ARTHROPLASTY;  Surgeon: Johnny Alf, MD;  Location: [facility];  Service: Orthopedics;  Laterality: Right;  . Cataract extraction w/ intraocular lens implant Right 2011  . Coronary artery bypass graft  03-06-2000   Johnny Johnny [name]    third vessel  . Cardiovascular stress test  08-09-2010  Johnny Johnny [name]    normal lexiscan nuclear study/  no ischemia/  normal lvf/  ef 54%  . Cardiac catheterization  03-03-2000  Johnny [name]    normal lvsf/  two-vessel cad significant complex stenosis at distal left main  . Penile prosthesis implant N/A 05/27/2013    Procedure: CYSTO REMOVAL OF PENILE PROSTHESIS;  Surgeon: Johnny Jabs, MD;  Location: [facility];  Service: Urology;  Laterality: N/A;     Family History   Problem Relation Age of Onset  . Coronary artery disease Other     1st degree male relative  . Hypertension Other   . Hypertension Mother   . Diabetes Father      History   Social History  . Marital Status: Married    Spouse Name: N/A    Number of Children: N/A  . Years of Education: N/A   Occupational History  . Preacher    Social History Main Topics  . Smoking status: Former Smoker    Types: Cigarettes    Quit date: 05/22/1970  . Smokeless tobacco: Never Used  . Alcohol Use: No  . Drug Use: No  . Sexual Activity: Not on file   Other Topics Concern  . Not on file   Social History Narrative   Regular Exercise-No           BP 131/63  Pulse 63  Ht 5\' 11"  (1.803 m)  Wt 252 lb (114.306 kg)  BMI 35.16 kg/m2  Physical Exam:  Well appearing middle-aged man, NAD HEENT: Unremarkable Neck:  7 cm JVD, no thyromegally Lungs:  Clear with no wheezes, rales, or rhonchi. HEART:  Regular rate rhythm, no murmurs, no rubs, no clicks, soft S4 Abd:  soft, positive bowel sounds, no organomegally, no rebound, no guarding Ext:  2 plus pulses, no edema, no cyanosis, no clubbing Skin:  No rashes no nodules Neuro:  CN II through XII intact, motor grossly intact   Assess/Plan:

## 2013-08-13 ENCOUNTER — Encounter: Payer: Self-pay | Admitting: Internal Medicine

## 2013-08-20 ENCOUNTER — Encounter (HOSPITAL_COMMUNITY): Payer: Medicare Other

## 2013-08-29 ENCOUNTER — Ambulatory Visit (HOSPITAL_COMMUNITY): Payer: Medicare Other | Attending: Internal Medicine | Admitting: Radiology

## 2013-08-29 VITALS — BP 150/72 | HR 52 | Ht 71.0 in | Wt 250.0 lb

## 2013-08-29 DIAGNOSIS — R0602 Shortness of breath: Secondary | ICD-10-CM | POA: Insufficient documentation

## 2013-08-29 DIAGNOSIS — I2510 Atherosclerotic heart disease of native coronary artery without angina pectoris: Secondary | ICD-10-CM

## 2013-08-29 DIAGNOSIS — I259 Chronic ischemic heart disease, unspecified: Secondary | ICD-10-CM

## 2013-08-29 DIAGNOSIS — R079 Chest pain, unspecified: Secondary | ICD-10-CM | POA: Insufficient documentation

## 2013-08-29 MED ORDER — REGADENOSON 0.4 MG/5ML IV SOLN
0.4000 mg | Freq: Once | INTRAVENOUS | Status: AC
  Administered 2013-08-29: 0.4 mg via INTRAVENOUS

## 2013-08-29 MED ORDER — TECHNETIUM TC 99M SESTAMIBI GENERIC - CARDIOLITE
33.0000 | Freq: Once | INTRAVENOUS | Status: AC | PRN
Start: 2013-08-29 — End: 2013-08-29
  Administered 2013-08-29: 33 via INTRAVENOUS

## 2013-08-29 MED ORDER — TECHNETIUM TC 99M SESTAMIBI GENERIC - CARDIOLITE
11.0000 | Freq: Once | INTRAVENOUS | Status: AC | PRN
  Administered 2013-08-29: 11 via INTRAVENOUS

## 2013-08-29 NOTE — Progress Notes (Signed)
Johnny [name] [address] [place], [place] [address] [phone]    Cardiology Nuclear Med Study  Johnny [name] is a 68 y.o. male     MRN : [id number]     DOB: 1945/10/09  Procedure Date: 08/29/2013  Nuclear Med Background Indication for Stress Test:  Evaluation for Ischemia and [facility]: 5/15 dyspnea History:  CAD-CATH-CABG-'12 MPI: EF: 54% NL  Cardiac Risk Factors: Family History - CAD, History of Smoking, Hypertension, IDDM Type 2 and Lipids  Symptoms:  Chest Pain, Chest Tightness and SOB   Nuclear Pre-Procedure Caffeine/Decaff Intake:  None NPO After: 8:30pm   Lungs:  clear O2 Sat: 98% on room air. IV 0.9% NS with Angio Cath:  22g  IV Site: R Antecubital  IV Started by:  Perrin Maltese, EMT-P  Chest Size (in):  44 Cup Size: n/a  Height: 5\' 11"  (1.803 m)  Weight:  250 lb (113.399 kg)  BMI:  Body mass index is 34.88 kg/(m^2). Tech Comments:  No Rx this am    Nuclear Med Study 1 or 2 day study: 1 day  Stress Test Type:  Carlton Adam  Reading MD: Johnny Him, MD  Order Authorizing Provider:  G.Lovena Le MD  Resting Radionuclide: Technetium 77m Sestamibi  Resting Radionuclide Dose: 11.0 mCi   Stress Radionuclide:  Technetium 96m Sestamibi  Stress Radionuclide Dose: 33.0 mCi           Stress Protocol Rest HR: 52 Stress HR: 80  Rest BP: 150/72 Stress BP: 145/62  Exercise Time (min): n/a METS: n/a           Dose of Adenosine (mg):  n/a Dose of Lexiscan: 0.4 mg  Dose of Atropine (mg): n/a Dose of Dobutamine: n/a mcg/kg/min (at max HR)  Stress Test Technologist: Glade Lloyd, BS-ES  Nuclear Technologist:  Annye Rusk, CNMT     Rest Procedure:  Myocardial perfusion imaging was performed at rest 45 minutes following the intravenous administration of Technetium 53m Sestamibi. Rest ECG: NSR with septal infarct age undetermined.  Stress Procedure:  The patient received IV Lexiscan 0.4 mg over 15-seconds.  Technetium 13m Sestamibi injected  at 30-seconds.  Quantitative spect images were obtained after a 45 minute delay.  During the infusion of Lexiscan the patient complained of SOB, chest tightness and nausea. These symptoms began to resolve in recovery.  Stress ECG: Nonspecific T wave abnormality  QPS Raw Data Images:  Mild diaphragmatic attenuation.  Normal left ventricular size. Stress Images:  There is decreased uptake in the basal inferior Hinchey. Rest Images:  There is decreased uptake in the basal inferior Melchor. Subtraction (SDS):  No evidence of ischemia. Transient Ischemic Dilatation (Normal <1.22):  1.07 Lung/Heart Ratio (Normal <0.45):  0.25  Quantitative Gated Spect Images QGS EDV:  133 ml QGS ESV:  64 ml  Impression Exercise Capacity:  Lexiscan with no exercise. BP Response:  Normal blood pressure response. Clinical Symptoms:  Mild chest pain/dyspnea. ECG Impression:  No significant ST segment change suggestive of ischemia. Comparison with Prior Nuclear Study: No images to compare  Overall Impression:  Low risk stress nuclear study Small fixed defect in the basal inferior Domek consistent with diaphragmatic attenuation.  No inducible ischemia..  LV Ejection Fraction: 52%.  LV Huneycutt Motion:  Low normal LVF  Signed: Fransico Him, MD [facility]

## 2013-09-03 ENCOUNTER — Encounter: Payer: Self-pay | Admitting: Internal Medicine

## 2013-09-03 ENCOUNTER — Other Ambulatory Visit (INDEPENDENT_AMBULATORY_CARE_PROVIDER_SITE_OTHER): Payer: Medicare Other

## 2013-09-03 ENCOUNTER — Ambulatory Visit (INDEPENDENT_AMBULATORY_CARE_PROVIDER_SITE_OTHER): Payer: Medicare Other | Admitting: Internal Medicine

## 2013-09-03 VITALS — BP 128/68 | HR 68 | Temp 98.3°F | Resp 16 | Wt 253.0 lb

## 2013-09-03 DIAGNOSIS — M1610 Unilateral primary osteoarthritis, unspecified hip: Secondary | ICD-10-CM

## 2013-09-03 DIAGNOSIS — E119 Type 2 diabetes mellitus without complications: Secondary | ICD-10-CM

## 2013-09-03 DIAGNOSIS — I10 Essential (primary) hypertension: Secondary | ICD-10-CM

## 2013-09-03 DIAGNOSIS — I259 Chronic ischemic heart disease, unspecified: Secondary | ICD-10-CM

## 2013-09-03 DIAGNOSIS — M169 Osteoarthritis of hip, unspecified: Secondary | ICD-10-CM

## 2013-09-03 LAB — BASIC METABOLIC PANEL
BUN: 10 mg/dL (ref 6–23)
CALCIUM: 9.4 mg/dL (ref 8.4–10.5)
CO2: 29 mEq/L (ref 19–32)
Chloride: 105 mEq/L (ref 96–112)
Creatinine, Ser: 0.9 mg/dL (ref 0.4–1.5)
GFR: 104.94 mL/min (ref >60.00)
Glucose, Bld: 65 mg/dL — ABNORMAL LOW (ref 70–99)
POTASSIUM: 4.1 meq/L (ref 3.5–5.1)
SODIUM: 140 meq/L (ref 135–145)

## 2013-09-03 LAB — HEMOGLOBIN A1C: HEMOGLOBIN A1C: 7 % — AB (ref 4.6–6.5)

## 2013-09-03 NOTE — Progress Notes (Signed)
Pre visit review using our clinic review tool, if applicable. No additional management support is needed unless otherwise documented below in the visit note. 

## 2013-09-03 NOTE — Assessment & Plan Note (Signed)
S/p CABG 1999 CL 6/15 low risk  Continue with current prescription therapy as reflected on the Med list.

## 2013-09-03 NOTE — Assessment & Plan Note (Signed)
Continue with current prescription therapy as reflected on the Med list.  

## 2013-09-03 NOTE — Assessment & Plan Note (Signed)
Continue with current prescription therapy as reflected on the Med list. Labs  

## 2013-09-03 NOTE — Assessment & Plan Note (Signed)
S/p surgery THR B - doing well

## 2013-09-03 NOTE — Progress Notes (Signed)
   Subjective:    HPI  The patient presents for a follow-up of  chronic hypertension, chronic dyslipidemia, type 2 diabetes controlled with medicines.  C/o ED, penile pump was removed 3/15 S/p R THR - 01/30/13    Wt Readings from Last 3 Encounters:  09/03/13 253 lb (114.76 kg)  08/29/13 250 lb (113.399 kg)  08/08/13 252 lb (114.306 kg)   BP Readings from Last 3 Encounters:  09/03/13 128/68  08/29/13 150/72  08/08/13 131/63      Review of Systems  Constitutional: Negative for appetite change, fatigue and unexpected weight change.  HENT: Negative for congestion, nosebleeds, sneezing and sore throat.   Eyes: Negative for itching and visual disturbance.  Respiratory: Negative for cough and shortness of breath.   Cardiovascular: Negative for palpitations and leg swelling.  Gastrointestinal: Negative for nausea, diarrhea, blood in stool and abdominal distention.  Genitourinary: Negative for frequency and hematuria.  Musculoskeletal: Negative for back pain, gait problem and joint swelling.  Skin: Negative for rash.  Neurological: Negative for dizziness, tremors and speech difficulty.  Psychiatric/Behavioral: Negative for sleep disturbance, dysphoric mood and agitation. The patient is not nervous/anxious.        Objective:   Physical Exam  Constitutional: He is oriented to person, place, and time. He appears well-developed.  Obese   HENT:  Mouth/Throat: Oropharynx is clear and moist.  Eyes: Conjunctivae are normal. Pupils are equal, round, and reactive to light.  Neck: Normal range of motion. No JVD present. No thyromegaly present.  Cardiovascular: Normal rate, regular rhythm, normal heart sounds and intact distal pulses.  Exam reveals no gallop and no friction rub.   No murmur heard. Pulmonary/Chest: Effort normal and breath sounds normal. No respiratory distress. He has no wheezes. He has no rales. He exhibits no tenderness.  Abdominal: Soft. Bowel sounds are normal. He  exhibits no distension and no mass. There is no tenderness. There is no rebound and no guarding.  Musculoskeletal: Normal range of motion. He exhibits no edema and no tenderness.  Lymphadenopathy:    He has no cervical adenopathy.  Neurological: He is alert and oriented to person, place, and time. He has normal reflexes. No cranial nerve deficit. He exhibits normal muscle tone. Coordination normal.  Skin: Skin is warm and dry. No rash noted.  Psychiatric: He has a normal mood and affect. His behavior is normal. Judgment and thought content normal.  R hip is not tender; Cane Wound is dressed  Lab Results  Component Value Date   WBC 5.8 07/20/2013   HGB 11.4* 07/20/2013   HCT 35.3* 07/20/2013   PLT 258 07/20/2013   GLUCOSE 150* 07/20/2013   CHOL 136 07/14/2011   TRIG 70.0 07/14/2011   HDL 32.50* 07/14/2011   LDLDIRECT 78.1 09/16/2008   LDLCALC 90 07/14/2011   ALT 13 01/21/2013   AST 13 01/21/2013   NA 141 07/20/2013   K 3.9 07/20/2013   CL 103 07/20/2013   CREATININE 0.78 07/20/2013   BUN 8 07/20/2013   CO2 26 07/20/2013   TSH 3.59 10/14/2010   PSA 1.28 10/24/2008   INR 1.15 01/21/2013   HGBA1C 7.4* 06/03/2013   CL - low risk     Assessment & Plan:

## 2013-09-04 ENCOUNTER — Telehealth: Payer: Self-pay | Admitting: Internal Medicine

## 2013-09-04 NOTE — Telephone Encounter (Signed)
Relevant patient education assigned to patient using Emmi. ° °

## 2013-09-09 ENCOUNTER — Telehealth: Payer: Self-pay

## 2013-09-09 DIAGNOSIS — E119 Type 2 diabetes mellitus without complications: Secondary | ICD-10-CM

## 2013-09-09 NOTE — Telephone Encounter (Signed)
Diabetic bundle- lipid, bmet and a1c ordered 

## 2013-09-12 ENCOUNTER — Other Ambulatory Visit: Payer: Self-pay | Admitting: *Deleted

## 2013-09-12 NOTE — Telephone Encounter (Signed)
D/c Enalapril Start Losartan 100 mg qd Thx

## 2013-09-12 NOTE — Telephone Encounter (Signed)
Pt stated md was going to change one of his medicine because he was having some breathing issues, and md stated that the med could be the cause. Pt is wanting to know which med because he is still have breathing problems, and request md to be change to something else...[name]

## 2013-09-13 MED ORDER — LOSARTAN POTASSIUM 100 MG PO TABS: 100.0000 mg | ORAL_TABLET | Freq: Every day | ORAL | Status: DC

## 2013-09-13 NOTE — Telephone Encounter (Signed)
Pt return call back gave md response. Sent losartan to [facility]...Johnny [name]

## 2013-09-13 NOTE — Telephone Encounter (Signed)
Called pt no answer LMOM RTC.../lmb 

## 2013-11-12 ENCOUNTER — Ambulatory Visit: Payer: Medicare Other | Admitting: Internal Medicine

## 2013-11-29 ENCOUNTER — Encounter: Payer: Self-pay | Admitting: Internal Medicine

## 2013-11-29 ENCOUNTER — Other Ambulatory Visit (INDEPENDENT_AMBULATORY_CARE_PROVIDER_SITE_OTHER): Payer: Medicare Other

## 2013-11-29 ENCOUNTER — Ambulatory Visit (INDEPENDENT_AMBULATORY_CARE_PROVIDER_SITE_OTHER): Payer: Medicare Other | Admitting: Internal Medicine

## 2013-11-29 VITALS — BP 138/64 | HR 69 | Temp 97.9°F | Wt 253.0 lb

## 2013-11-29 DIAGNOSIS — I10 Essential (primary) hypertension: Secondary | ICD-10-CM

## 2013-11-29 DIAGNOSIS — E119 Type 2 diabetes mellitus without complications: Secondary | ICD-10-CM

## 2013-11-29 DIAGNOSIS — Z23 Encounter for immunization: Secondary | ICD-10-CM

## 2013-11-29 DIAGNOSIS — I259 Chronic ischemic heart disease, unspecified: Secondary | ICD-10-CM

## 2013-11-29 DIAGNOSIS — N320 Bladder-neck obstruction: Secondary | ICD-10-CM

## 2013-11-29 LAB — BASIC METABOLIC PANEL
BUN: 13 mg/dL (ref 6–23)
CHLORIDE: 104 meq/L (ref 96–112)
CO2: 25 mEq/L (ref 19–32)
Calcium: 9.1 mg/dL (ref 8.4–10.5)
Creatinine, Ser: 1 mg/dL (ref 0.4–1.5)
GFR: 91.03 mL/min (ref >60.00)
Glucose, Bld: 85 mg/dL (ref 70–99)
POTASSIUM: 3.7 meq/L (ref 3.5–5.1)
SODIUM: 137 meq/L (ref 135–145)

## 2013-11-29 LAB — LIPID PANEL
CHOLESTEROL: 143 mg/dL (ref 0–200)
HDL: 32.7 mg/dL — AB (ref >39.00)
LDL Cholesterol: 74 mg/dL (ref 0–99)
NONHDL: 110.3
Total CHOL/HDL Ratio: 4
Triglycerides: 182 mg/dL — ABNORMAL HIGH (ref 0.0–149.0)
VLDL: 36.4 mg/dL (ref 0.0–40.0)

## 2013-11-29 LAB — HEMOGLOBIN A1C: Hgb A1c MFr Bld: 7 % — ABNORMAL HIGH (ref 4.6–6.5)

## 2013-11-29 MED ORDER — TAMSULOSIN HCL 0.4 MG PO CAPS: 0.4000 mg | ORAL_CAPSULE | Freq: Every day | ORAL | Status: DC

## 2013-11-29 MED ORDER — FINASTERIDE 5 MG PO TABS
5.0000 mg | ORAL_TABLET | Freq: Every morning | ORAL | Status: DC
Start: 2013-11-29 — End: 2014-04-04

## 2013-11-29 NOTE — Assessment & Plan Note (Signed)
Continue with current prescription therapy as reflected on the Med list. Doing well 

## 2013-11-29 NOTE — Assessment & Plan Note (Signed)
Continue with current prescription therapy as reflected on the Med list.  

## 2013-11-29 NOTE — Progress Notes (Signed)
   Subjective:    HPI  The patient presents for a follow-up of  chronic hypertension, chronic dyslipidemia, type 2 diabetes controlled with medicines.  C/o ED, penile pump was removed 3/15 S/p R THR - 01/30/13    Wt Readings from Last 3 Encounters:  11/29/13 253 lb (114.76 kg)  09/03/13 253 lb (114.76 kg)  08/29/13 250 lb (113.399 kg)   BP Readings from Last 3 Encounters:  11/29/13 138/64  09/03/13 128/68  08/29/13 150/72      Review of Systems  Constitutional: Negative for appetite change, fatigue and unexpected weight change.  HENT: Negative for congestion, nosebleeds, sneezing and sore throat.   Eyes: Negative for itching and visual disturbance.  Respiratory: Negative for cough and shortness of breath.   Cardiovascular: Negative for palpitations and leg swelling.  Gastrointestinal: Negative for nausea, diarrhea, blood in stool and abdominal distention.  Genitourinary: Negative for frequency and hematuria.  Musculoskeletal: Negative for back pain, gait problem and joint swelling.  Skin: Negative for rash.  Neurological: Negative for dizziness, tremors and speech difficulty.  Psychiatric/Behavioral: Negative for sleep disturbance, dysphoric mood and agitation. The patient is not nervous/anxious.        Objective:   Physical Exam  Constitutional: He is oriented to person, place, and time. He appears well-developed.  Obese   HENT:  Mouth/Throat: Oropharynx is clear and moist.  Eyes: Conjunctivae are normal. Pupils are equal, round, and reactive to light.  Neck: Normal range of motion. No JVD present. No thyromegaly present.  Cardiovascular: Normal rate, regular rhythm, normal heart sounds and intact distal pulses.  Exam reveals no gallop and no friction rub.   No murmur heard. Pulmonary/Chest: Effort normal and breath sounds normal. No respiratory distress. He has no wheezes. He has no rales. He exhibits no tenderness.  Abdominal: Soft. Bowel sounds are normal. He  exhibits no distension and no mass. There is no tenderness. There is no rebound and no guarding.  Musculoskeletal: Normal range of motion. He exhibits no edema and no tenderness.  Lymphadenopathy:    He has no cervical adenopathy.  Neurological: He is alert and oriented to person, place, and time. He has normal reflexes. No cranial nerve deficit. He exhibits normal muscle tone. Coordination normal.  Skin: Skin is warm and dry. No rash noted.  Psychiatric: He has a normal mood and affect. His behavior is normal. Judgment and thought content normal.  R hip is not tender; Cane Wound is dressed  Lab Results  Component Value Date   WBC 5.8 07/20/2013   HGB 11.4* 07/20/2013   HCT 35.3* 07/20/2013   PLT 258 07/20/2013   GLUCOSE 65* 09/03/2013   CHOL 136 07/14/2011   TRIG 70.0 07/14/2011   HDL 32.50* 07/14/2011   LDLDIRECT 78.1 09/16/2008   LDLCALC 90 07/14/2011   ALT 13 01/21/2013   AST 13 01/21/2013   NA 140 09/03/2013   K 4.1 09/03/2013   CL 105 09/03/2013   CREATININE 0.9 09/03/2013   BUN 10 09/03/2013   CO2 29 09/03/2013   TSH 3.59 10/14/2010   PSA 1.28 10/24/2008   INR 1.15 01/21/2013   HGBA1C 7.0* 09/03/2013   CL - low risk     Assessment & Plan:

## 2013-11-29 NOTE — Progress Notes (Deleted)
Pre visit review using our clinic review tool, if applicable. No additional management support is needed unless otherwise documented below in the visit note. 

## 2013-12-11 ENCOUNTER — Ambulatory Visit (INDEPENDENT_AMBULATORY_CARE_PROVIDER_SITE_OTHER): Payer: Medicare Other | Admitting: Internal Medicine

## 2013-12-11 ENCOUNTER — Encounter: Payer: Self-pay | Admitting: Internal Medicine

## 2013-12-11 VITALS — BP 142/62 | HR 54 | Ht 71.0 in | Wt 255.4 lb

## 2013-12-11 DIAGNOSIS — Z951 Presence of aortocoronary bypass graft: Secondary | ICD-10-CM

## 2013-12-11 DIAGNOSIS — R079 Chest pain, unspecified: Secondary | ICD-10-CM

## 2013-12-11 DIAGNOSIS — I10 Essential (primary) hypertension: Secondary | ICD-10-CM

## 2013-12-11 NOTE — Assessment & Plan Note (Signed)
His blood pressure is only slightly elevated today. I've asked the patient to reduce his sodium intake. No change in medical therapy.

## 2013-12-11 NOTE — Progress Notes (Signed)
HPI [name] returns today for evaluation of chest pain. He is a very pleasant 68 year old man with a history of hypertension, coronary disease, dyslipidemia, and diabetes. When I saw the patient several months ago, he was experiencing chest pressure associated with sob. He had a stress test which was considered low risk. He had preserved left ventricular function. No peripheral edema. No syncope.  The patient's biggest complaint is difficulty with his diet. He has gained 20 pounds. He admits to dietary indiscretion. Allergies  Allergen Reactions  . Enalapril     SOB  . Hydrocodone-Acetaminophen Nausea Only     Current Outpatient Prescriptions  Medication Sig Dispense Refill  . albuterol (PROVENTIL HFA;VENTOLIN HFA) 108 (90 BASE) MCG/ACT inhaler Inhale 2 puffs into the lungs every 4 (four) hours as needed for wheezing or shortness of breath.  3.7 g  0  . aspirin 325 MG EC tablet Take 325 mg by mouth every morning.      . cholecalciferol (VITAMIN D) 1000 UNITS tablet Take 1,000 Units by mouth every morning.      [name] doxazosin (CARDURA) 8 MG tablet Take 1 tablet (8 mg total) by mouth every morning.  90 tablet  3  . finasteride (PROSCAR) 5 MG tablet Take 1 tablet (5 mg total) by mouth every morning.  30 tablet  11  . glimepiride (AMARYL) 4 MG tablet Take 1 tablet (4 mg total) by mouth daily with breakfast.  90 tablet  3  . losartan (COZAAR) 100 MG tablet Take 1 tablet (100 mg total) by mouth daily.  90 tablet  3  . lovastatin (MEVACOR) 40 MG tablet Take 1 tablet (40 mg total) by mouth every morning.  90 tablet  3  . metFORMIN (GLUCOPHAGE) 1000 MG tablet Take 1 tablet (1,000 mg total) by mouth 2 (two) times daily with a meal.  180 tablet  3  . nitroGLYCERIN (NITROSTAT) 0.4 MG SL tablet Place 1 tablet (0.4 mg total) under the tongue every 5 (five) minutes as needed for chest pain.  15 tablet  3  . pantoprazole (PROTONIX) 40 MG tablet Take 1 tablet (40 mg total) by mouth every morning.  90 tablet  3   . tamsulosin (FLOMAX) 0.4 MG CAPS capsule Take 1 capsule (0.4 mg total) by mouth daily.  30 capsule  11   No current facility-administered medications for this visit.     Past Medical History  Diagnosis Date  . Benign prostatic hypertrophy   . Arthritis   . Benign positional vertigo   . Chronic ischemic heart disease, unspecified   . Hyperlipidemia   . HTN (hypertension)   . Type 2 diabetes mellitus   . GERD (gastroesophageal reflux disease)   . CAD (coronary artery disease)     CARDIOLOGIST--  DR Cristopher Peru  . ED (erectile dysfunction) of organic origin   . Wears glasses   . History of CHF (congestive heart failure)     systolic  . At risk for sleep apnea     STOP-BANG=  6   SENT TO PCP 05-21-2013    ROS:   All systems reviewed and negative except as noted in the HPI.   Past Surgical History  Procedure Laterality Date  . Penile prosthesis implant  02-08-2010    COLOPLAST 3-PIECE INFLATABLE  . Total hip arthroplasty  01/24/2011    Procedure: TOTAL HIP ARTHROPLASTY;  Surgeon: Dione Plover [name];  Location: [facility];  Service: Orthopedics;  Laterality: Left;  . Total hip arthroplasty Right 01/30/2013  Procedure: RIGHT TOTAL HIP ARTHROPLASTY;  Surgeon: Gearlean Alf, MD;  Location: [facility];  Service: Orthopedics;  Laterality: Right;  . Cataract extraction w/ intraocular lens implant Right 2011  . Coronary artery bypass graft  03-06-2000   DR LAGTXMIW    third vessel  . Cardiovascular stress test  08-09-2010  DR Carleene Overlie TAYLOR    normal lexiscan nuclear study/  no ischemia/  normal lvf/  ef 54%  . Cardiac catheterization  03-03-2000  dr gregg taylor    normal lvsf/  two-vessel cad significant complex stenosis at distal left main  . Penile prosthesis implant N/A 05/27/2013    Procedure: CYSTO REMOVAL OF PENILE PROSTHESIS;  Surgeon: Claybon Jabs, MD;  Location[facility];  Service: Urology;  Laterality: N/A;     Family History  Problem Relation Age of  Onset  . Coronary artery disease Other     1st degree male relative  . Hypertension Other   . Hypertension Mother   . Diabetes Father      History   Social History  . Marital Status: Married    Spouse Name: N/A    Number of Children: N/A  . Years of Education: N/A   Occupational History  . Preacher    Social History Main Topics  . Smoking status: Former Smoker    Types: Cigarettes    Quit date: 05/22/1970  . Smokeless tobacco: Never Used  . Alcohol Use: No  . Drug Use: No  . Sexual Activity: Not on file   Other Topics Concern  . Not on file   Social History Narrative   Regular Exercise-No           BP 142/62  Pulse 54  Ht 5\' 11"  (1.803 m)  Wt 255 lb 6.4 oz (115.849 kg)  BMI 35.64 kg/m2  Physical Exam:  Well appearing obese, middle-aged man, NAD HEENT: Unremarkable Neck:  7 cm JVD, no thyromegally Lungs:  Clear with no wheezes, rales, or rhonchi. HEART:  Regular rate rhythm, no murmurs, no rubs, no clicks, soft S4 Abd:  soft, positive bowel sounds, no organomegally, no rebound, no guarding Ext:  2 plus pulses, no edema, no cyanosis, no clubbing Skin:  No rashes no nodules Neuro:  CN II through XII intact, motor grossly intact   Assess/Plan:

## 2013-12-11 NOTE — Assessment & Plan Note (Signed)
I have strongly encouraged the patient to lose weight. He is instructed it less, and exercise more.

## 2013-12-11 NOTE — Patient Instructions (Signed)
Your physician wants you to follow-up in: 6 months with Dr. Lovena Le. You will receive a reminder letter in the mail two months in advance. If you don't receive a letter, please call our office to schedule the follow-up appointment.  Your physician recommends that you continue on your current medications as directed. Please refer to the Current Medication list given to you today.

## 2013-12-11 NOTE — Assessment & Plan Note (Signed)
The patient has no additional angina. He is encouraged to increase his physical activity. No change in medical therapy.

## 2013-12-18 ENCOUNTER — Encounter: Payer: Self-pay | Admitting: Gastroenterology

## 2014-01-13 ENCOUNTER — Other Ambulatory Visit: Payer: Self-pay | Admitting: Internal Medicine

## 2014-04-04 ENCOUNTER — Encounter: Payer: Self-pay | Admitting: Internal Medicine

## 2014-04-04 ENCOUNTER — Other Ambulatory Visit (INDEPENDENT_AMBULATORY_CARE_PROVIDER_SITE_OTHER): Payer: Commercial Managed Care - HMO

## 2014-04-04 ENCOUNTER — Ambulatory Visit (INDEPENDENT_AMBULATORY_CARE_PROVIDER_SITE_OTHER): Payer: Commercial Managed Care - HMO | Admitting: Internal Medicine

## 2014-04-04 VITALS — BP 150/60 | HR 80 | Temp 98.4°F | Wt 258.0 lb

## 2014-04-04 DIAGNOSIS — M160 Bilateral primary osteoarthritis of hip: Secondary | ICD-10-CM | POA: Diagnosis not present

## 2014-04-04 DIAGNOSIS — E1159 Type 2 diabetes mellitus with other circulatory complications: Secondary | ICD-10-CM

## 2014-04-04 DIAGNOSIS — M25559 Pain in unspecified hip: Secondary | ICD-10-CM

## 2014-04-04 DIAGNOSIS — E1151 Type 2 diabetes mellitus with diabetic peripheral angiopathy without gangrene: Secondary | ICD-10-CM

## 2014-04-04 DIAGNOSIS — K219 Gastro-esophageal reflux disease without esophagitis: Secondary | ICD-10-CM

## 2014-04-04 DIAGNOSIS — I2510 Atherosclerotic heart disease of native coronary artery without angina pectoris: Secondary | ICD-10-CM

## 2014-04-04 LAB — BASIC METABOLIC PANEL
BUN: 9 mg/dL (ref 6–23)
CO2: 26 meq/L (ref 19–32)
Calcium: 9.4 mg/dL (ref 8.4–10.5)
Chloride: 105 mEq/L (ref 96–112)
Creatinine, Ser: 0.97 mg/dL (ref 0.40–1.50)
GFR: 98.56 mL/min (ref >60.00)
Glucose, Bld: 82 mg/dL (ref 70–99)
Potassium: 4.1 mEq/L (ref 3.5–5.1)
Sodium: 139 mEq/L (ref 135–145)

## 2014-04-04 LAB — HEMOGLOBIN A1C: Hgb A1c MFr Bld: 7.8 % — ABNORMAL HIGH (ref 4.6–6.5)

## 2014-04-04 MED ORDER — TAMSULOSIN HCL 0.4 MG PO CAPS: 0.4000 mg | ORAL_CAPSULE | Freq: Every day | ORAL | Status: DC

## 2014-04-04 MED ORDER — FINASTERIDE 5 MG PO TABS: 5.0000 mg | ORAL_TABLET | Freq: Every morning | ORAL | Status: DC

## 2014-04-04 MED ORDER — DOXAZOSIN MESYLATE 8 MG PO TABS: 8.0000 mg | ORAL_TABLET | Freq: Every morning | ORAL | Status: DC

## 2014-04-04 MED ORDER — METFORMIN HCL 1000 MG PO TABS: 1000.0000 mg | ORAL_TABLET | Freq: Two times a day (BID) | ORAL | Status: DC

## 2014-04-04 MED ORDER — ALBUTEROL SULFATE HFA 108 (90 BASE) MCG/ACT IN AERS
2.0000 | INHALATION_SPRAY | RESPIRATORY_TRACT | Status: DC | PRN
Start: 2014-04-04 — End: 2015-04-21

## 2014-04-04 MED ORDER — PANTOPRAZOLE SODIUM 40 MG PO TBEC: 40.0000 mg | DELAYED_RELEASE_TABLET | Freq: Every morning | ORAL | Status: DC

## 2014-04-04 MED ORDER — LOVASTATIN 40 MG PO TABS: 40.0000 mg | ORAL_TABLET | Freq: Every morning | ORAL | Status: DC

## 2014-04-04 MED ORDER — LOSARTAN POTASSIUM 100 MG PO TABS: 100.0000 mg | ORAL_TABLET | Freq: Every day | ORAL | Status: DC

## 2014-04-04 MED ORDER — GLIMEPIRIDE 4 MG PO TABS: 4.0000 mg | ORAL_TABLET | Freq: Every day | ORAL | Status: DC

## 2014-04-04 NOTE — Assessment & Plan Note (Signed)
S/p THR

## 2014-04-04 NOTE — Assessment & Plan Note (Signed)
Continue with current prescription therapy as reflected on the Med list. Labs  

## 2014-04-04 NOTE — Assessment & Plan Note (Signed)
Continue with current prescription therapy as reflected on the Med list.  

## 2014-04-04 NOTE — Progress Notes (Signed)
   Subjective:    HPI  The patient presents for a follow-up of  chronic hypertension, chronic dyslipidemia, type 2 diabetes controlled with medicines.  F/u ED, penile pump was removed 3/15      Wt Readings from Last 3 Encounters:  04/04/14 258 lb (117.028 kg)  12/11/13 255 lb 6.4 oz (115.849 kg)  11/29/13 253 lb (114.76 kg)   BP Readings from Last 3 Encounters:  04/04/14 150/60  12/11/13 142/62  11/29/13 138/64      Review of Systems  Constitutional: Negative for appetite change, fatigue and unexpected weight change.  HENT: Negative for congestion, nosebleeds, sneezing and sore throat.   Eyes: Negative for itching and visual disturbance.  Respiratory: Negative for cough and shortness of breath.   Cardiovascular: Negative for palpitations and leg swelling.  Gastrointestinal: Negative for nausea, diarrhea, blood in stool and abdominal distention.  Genitourinary: Negative for frequency and hematuria.  Musculoskeletal: Negative for back pain, joint swelling and gait problem.  Skin: Negative for rash.  Neurological: Negative for dizziness, tremors and speech difficulty.  Psychiatric/Behavioral: Negative for sleep disturbance, dysphoric mood and agitation. The patient is not nervous/anxious.        Objective:   Physical Exam  Constitutional: He is oriented to person, place, and time. He appears well-developed.  Obese   HENT:  Mouth/Throat: Oropharynx is clear and moist.  Eyes: Conjunctivae are normal. Pupils are equal, round, and reactive to light.  Neck: Normal range of motion. No JVD present. No thyromegaly present.  Cardiovascular: Normal rate, regular rhythm, normal heart sounds and intact distal pulses.  Exam reveals no gallop and no friction rub.   No murmur heard. Pulmonary/Chest: Effort normal and breath sounds normal. No respiratory distress. He has no wheezes. He has no rales. He exhibits no tenderness.  Abdominal: Soft. Bowel sounds are normal. He exhibits no  distension and no mass. There is no tenderness. There is no rebound and no guarding.  Musculoskeletal: Normal range of motion. He exhibits no edema or tenderness.  Lymphadenopathy:    He has no cervical adenopathy.  Neurological: He is alert and oriented to person, place, and time. He has normal reflexes. No cranial nerve deficit. He exhibits normal muscle tone. Coordination normal.  Skin: Skin is warm and dry. No rash noted.  Psychiatric: He has a normal mood and affect. His behavior is normal. Judgment and thought content normal.  Limping a little  Lab Results  Component Value Date   WBC 5.8 07/20/2013   HGB 11.4* 07/20/2013   HCT 35.3* 07/20/2013   PLT 258 07/20/2013   GLUCOSE 85 11/29/2013   CHOL 143 11/29/2013   TRIG 182.0* 11/29/2013   HDL 32.70* 11/29/2013   LDLDIRECT 78.1 09/16/2008   LDLCALC 74 11/29/2013   ALT 13 01/21/2013   AST 13 01/21/2013   NA 137 11/29/2013   K 3.7 11/29/2013   CL 104 11/29/2013   CREATININE 1.0 11/29/2013   BUN 13 11/29/2013   CO2 25 11/29/2013   TSH 3.59 10/14/2010   PSA 1.28 10/24/2008   INR 1.15 01/21/2013   HGBA1C 7.0* 11/29/2013   CL - low risk     Assessment & Plan:  Patient ID: Johnny [name], male   DOB: [birth year], 69 y.o.   MRN: [id number]

## 2014-04-04 NOTE — Assessment & Plan Note (Signed)
Wt Readings from Last 3 Encounters:  04/04/14 258 lb (117.028 kg)  12/11/13 255 lb 6.4 oz (115.849 kg)  11/29/13 253 lb (114.76 kg)

## 2014-04-04 NOTE — Progress Notes (Signed)
Pre visit review using our clinic review tool, if applicable. No additional management support is needed unless otherwise documented below in the visit note. 

## 2014-04-25 ENCOUNTER — Telehealth: Payer: Self-pay

## 2014-04-25 NOTE — Telephone Encounter (Signed)
Received pharmacy rejection stating that insurance will not cover cadura without a prior authorization. PA started and pending decision insurance at this point.

## 2014-04-29 NOTE — Telephone Encounter (Signed)
What Rx is it? Thx

## 2014-04-29 NOTE — Telephone Encounter (Signed)
PA was denied because the pt is rx 2 drug within the same therapeutic class.   Please advise of the clinical determination that 2 drugs of the same therapeutic class and I will submit the appeal on behalf of the pt.

## 2014-04-30 ENCOUNTER — Telehealth: Payer: Self-pay | Admitting: *Deleted

## 2014-04-30 NOTE — Telephone Encounter (Signed)
[facility] - Client [facility] Patient Name: Johnny [name] Gender: Male DOB: 07/28/1967 Age: 69 Y 80 M 27 D Return Phone Number: [phone] (Primary) Address: [address] City/State/Zip: [place] [address] Client [facility] - Client Client Site [place] - Day Physician Cathlean Cower Contact Type Call Call Type Triage / Clinical Relationship To Patient Self Appointment Disposition EMR Appointment Scheduled Return Phone Number [phone] (Primary) Chief Complaint Rash - Localized Initial Comment Caller states he has a rash between his legs. Caller states it feels like a burn and has white pus. PreDisposition Call Doctor Info pasted into Epic Yes Nurse Assessment Nurse: Marcelline Deist, RN, Kermit Balo Date/Time Eilene Ghazi Time): 04/25/2014 9:52:33 AM Confirm and document reason for call. If symptomatic, describe symptoms. ---Caller states he has a red rash between his legs, on right side. Caller states it feels like a chemical burn and has white pus draining. Recently started on Lamictal 225 mg on Monday & Tues., then switched to 25 mg. Had been seen in the ER. Has the patient traveled out of the country within the last 30 days? ---Not Applicable Does the patient require triage? ---Yes Related visit to physician within the last 2 weeks? ---Yes Does the PT have any chronic conditions? (i.e. diabetes, asthma, etc.) ---Yes List chronic conditions. ---back pain Guidelines Guideline Title Affirmed Question Affirmed Notes Nurse Date/Time (Eastern Time) Rash or Redness - Localized [1] Looks infected (spreading redness, pus) AND [2] large red area (> 2 in. or 5 cm) Marcelline Deist, RN, Lynda 04/25/2014 9:57:28 AM Disp. Time Eilene Ghazi Time) Disposition Final User 04/25/2014 10:04:11 AM See Physician within 4 Hours (or PCP triage) Yes Marcelline Deist, RN, Kermit Balo PLEASE NOTE: All timestamps contained within  this report are represented as [place] Standard Time. CONFIDENTIALTY NOTICE: This fax transmission is intended only for the addressee. It contains information that is legally privileged, confidential or otherwise protected from use or disclosure. If you are not the intended recipient, you are strictly prohibited from reviewing, disclosing, copying using or disseminating any of this information or taking any action in reliance on or regarding this information. If you have received this fax in error, please notify us immediately by telephone so that we can arrange for its return to [place]. Phone: [phone], Toll-Free: [phone], Fax: [phone] Page: 2 of 2 Call Id: [id number] [place] Understands: Yes Disagree/Comply: Comply Care Advice Given Per Guideline

## 2014-04-30 NOTE — Telephone Encounter (Signed)
Cardura is the rx that needed the PA.

## 2014-04-30 NOTE — Telephone Encounter (Signed)
I do not know which other rx they consider to be in the same drug class as Cardura.

## 2014-04-30 NOTE — Addendum Note (Signed)
Addended by: Cassandria Anger on: 04/30/2014 04:21 PM   Modules accepted: Orders, Medications

## 2014-04-30 NOTE — Telephone Encounter (Signed)
D/c flomax Cont Cardura Thx

## 2014-05-01 NOTE — Telephone Encounter (Signed)
Pt informed of below.  

## 2014-08-01 ENCOUNTER — Ambulatory Visit (INDEPENDENT_AMBULATORY_CARE_PROVIDER_SITE_OTHER): Payer: Commercial Managed Care - HMO | Admitting: Internal Medicine

## 2014-08-01 ENCOUNTER — Encounter: Payer: Self-pay | Admitting: Internal Medicine

## 2014-08-01 VITALS — BP 148/70 | HR 72 | Temp 98.3°F | Ht 71.0 in | Wt 250.0 lb

## 2014-08-01 DIAGNOSIS — I10 Essential (primary) hypertension: Secondary | ICD-10-CM | POA: Diagnosis not present

## 2014-08-01 DIAGNOSIS — E1151 Type 2 diabetes mellitus with diabetic peripheral angiopathy without gangrene: Secondary | ICD-10-CM | POA: Diagnosis not present

## 2014-08-01 DIAGNOSIS — N320 Bladder-neck obstruction: Secondary | ICD-10-CM

## 2014-08-01 DIAGNOSIS — E785 Hyperlipidemia, unspecified: Secondary | ICD-10-CM | POA: Diagnosis not present

## 2014-08-01 DIAGNOSIS — E1159 Type 2 diabetes mellitus with other circulatory complications: Secondary | ICD-10-CM

## 2014-08-01 DIAGNOSIS — I2510 Atherosclerotic heart disease of native coronary artery without angina pectoris: Secondary | ICD-10-CM

## 2014-08-01 NOTE — Progress Notes (Signed)
Pre visit review using our clinic review tool, if applicable. No additional management support is needed unless otherwise documented below in the visit note. 

## 2014-08-01 NOTE — Assessment & Plan Note (Addendum)
Labs  Metformin, Glimepiride

## 2014-08-01 NOTE — Patient Instructions (Signed)
Normal BP<130/85 

## 2014-08-01 NOTE — Progress Notes (Signed)
   Subjective:    HPI  The patient presents for a follow-up of  chronic hypertension, chronic dyslipidemia, type 2 diabetes controlled with medicines.  F/u ED, penile pump was removed 3/15      Wt Readings from Last 3 Encounters:  08/01/14 250 lb (113.399 kg)  04/04/14 258 lb (117.028 kg)  12/11/13 255 lb 6.4 oz (115.849 kg)   BP Readings from Last 3 Encounters:  08/01/14 148/70  04/04/14 150/60  12/11/13 142/62      Review of Systems  Constitutional: Negative for appetite change, fatigue and unexpected weight change.  HENT: Negative for congestion, nosebleeds, sneezing and sore throat.   Eyes: Negative for itching and visual disturbance.  Respiratory: Negative for cough and shortness of breath.   Cardiovascular: Negative for palpitations and leg swelling.  Gastrointestinal: Negative for nausea, diarrhea, blood in stool and abdominal distention.  Genitourinary: Negative for frequency and hematuria.  Musculoskeletal: Negative for back pain, joint swelling and gait problem.  Skin: Negative for rash.  Neurological: Negative for dizziness, tremors and speech difficulty.  Psychiatric/Behavioral: Negative for sleep disturbance, dysphoric mood and agitation. The patient is not nervous/anxious.        Objective:   Physical Exam  Constitutional: He is oriented to person, place, and time. He appears well-developed.  Obese   HENT:  Mouth/Throat: Oropharynx is clear and moist.  Eyes: Conjunctivae are normal. Pupils are equal, round, and reactive to light.  Neck: Normal range of motion. No JVD present. No thyromegaly present.  Cardiovascular: Normal rate, regular rhythm, normal heart sounds and intact distal pulses.  Exam reveals no gallop and no friction rub.   No murmur heard. Pulmonary/Chest: Effort normal and breath sounds normal. No respiratory distress. He has no wheezes. He has no rales. He exhibits no tenderness.  Abdominal: Soft. Bowel sounds are normal. He exhibits  no distension and no mass. There is no tenderness. There is no rebound and no guarding.  Musculoskeletal: Normal range of motion. He exhibits no edema or tenderness.  Lymphadenopathy:    He has no cervical adenopathy.  Neurological: He is alert and oriented to person, place, and time. He has normal reflexes. No cranial nerve deficit. He exhibits normal muscle tone. Coordination normal.  Skin: Skin is warm and dry. No rash noted.  Psychiatric: He has a normal mood and affect. His behavior is normal. Judgment and thought content normal.  Limping a little  Lab Results  Component Value Date   WBC 5.8 07/20/2013   HGB 11.4* 07/20/2013   HCT 35.3* 07/20/2013   PLT 258 07/20/2013   GLUCOSE 82 04/04/2014   CHOL 143 11/29/2013   TRIG 182.0* 11/29/2013   HDL 32.70* 11/29/2013   LDLDIRECT 78.1 09/16/2008   LDLCALC 74 11/29/2013   ALT 13 01/21/2013   AST 13 01/21/2013   NA 139 04/04/2014   K 4.1 04/04/2014   CL 105 04/04/2014   CREATININE 0.97 04/04/2014   BUN 9 04/04/2014   CO2 26 04/04/2014   TSH 3.59 10/14/2010   PSA 1.28 10/24/2008   INR 1.15 01/21/2013   HGBA1C 7.8* 04/04/2014   CL - low risk     Assessment & Plan:

## 2014-08-05 NOTE — Assessment & Plan Note (Signed)
Chronic  Losartan 

## 2014-08-05 NOTE — Assessment & Plan Note (Signed)
Chronic Losartan, NTG prn, Lovastatin, ASA

## 2014-08-05 NOTE — Assessment & Plan Note (Signed)
Chronic  Tamsulosin, Finesteride

## 2014-08-05 NOTE — Assessment & Plan Note (Signed)
Chronic  On Lovastatin 

## 2014-08-07 ENCOUNTER — Other Ambulatory Visit (INDEPENDENT_AMBULATORY_CARE_PROVIDER_SITE_OTHER): Payer: Commercial Managed Care - HMO

## 2014-08-07 DIAGNOSIS — E1159 Type 2 diabetes mellitus with other circulatory complications: Secondary | ICD-10-CM

## 2014-08-07 DIAGNOSIS — E1151 Type 2 diabetes mellitus with diabetic peripheral angiopathy without gangrene: Secondary | ICD-10-CM

## 2014-08-07 LAB — BASIC METABOLIC PANEL
BUN: 12 mg/dL (ref 6–23)
CALCIUM: 9.2 mg/dL (ref 8.4–10.5)
CHLORIDE: 103 meq/L (ref 96–112)
CO2: 27 mEq/L (ref 19–32)
Creatinine, Ser: 1.04 mg/dL (ref 0.40–1.50)
GFR: 90.85 mL/min (ref >60.00)
GLUCOSE: 194 mg/dL — AB (ref 70–99)
Potassium: 4.2 mEq/L (ref 3.5–5.1)
Sodium: 136 mEq/L (ref 135–145)

## 2014-08-07 LAB — HEMOGLOBIN A1C: Hgb A1c MFr Bld: 7.4 % — ABNORMAL HIGH (ref 4.6–6.5)

## 2014-09-24 DIAGNOSIS — E119 Type 2 diabetes mellitus without complications: Secondary | ICD-10-CM | POA: Diagnosis not present

## 2014-09-24 DIAGNOSIS — H524 Presbyopia: Secondary | ICD-10-CM | POA: Diagnosis not present

## 2014-09-24 DIAGNOSIS — H35033 Hypertensive retinopathy, bilateral: Secondary | ICD-10-CM | POA: Diagnosis not present

## 2014-09-24 DIAGNOSIS — H26493 Other secondary cataract, bilateral: Secondary | ICD-10-CM | POA: Diagnosis not present

## 2014-10-01 ENCOUNTER — Telehealth: Payer: Self-pay | Admitting: Internal Medicine

## 2014-10-01 NOTE — Telephone Encounter (Signed)
Patient is in need of refills for glucose blood (ONETOUCH VERIO IQ) test strip. He is totally out. They are not sure whether the best route is to use mail order, or [facility], because they have been told that mail order does not carry these strips. If there is a comparable strip, they would prefer mail order. If not, then they will use [facility]. Please call patient to advise @ # attached

## 2014-10-01 NOTE — Telephone Encounter (Signed)
Patient request alternative for one touch verio strips. Please advise

## 2014-10-01 NOTE — Telephone Encounter (Signed)
Ok to fill strips that are covered Pls give strips samples if we have for now Thx

## 2014-10-02 NOTE — Telephone Encounter (Signed)
Need to advise patient to check with insurance and let us know what is preferred brand, LM via mychart.

## 2014-10-15 NOTE — Telephone Encounter (Signed)
[employer]

## 2014-10-15 NOTE — Telephone Encounter (Signed)
[employer] will be sending in a request for a prescription for her test scripts.  She wanted you to call her regarding this. She'll be at the home number till 9 or you can call her cell [phone] after that.

## 2014-10-16 ENCOUNTER — Other Ambulatory Visit: Payer: Self-pay | Admitting: *Deleted

## 2014-10-16 MED ORDER — ACCU-CHEK AVIVA PLUS W/DEVICE KIT: PACK | Status: DC

## 2014-10-16 MED ORDER — GLUCOSE BLOOD VI STRP: ORAL_STRIP | Status: DC

## 2014-10-16 MED ORDER — ACCU-CHEK SOFTCLIX LANCET DEV MISC: Status: DC

## 2014-10-16 MED ORDER — ACCU-CHEK AVIVA VI SOLN: Status: DC

## 2014-10-16 MED ORDER — BD SWAB SINGLE USE REGULAR PADS: 1.0000 | MEDICATED_PAD | Freq: Two times a day (BID) | Status: DC | PRN

## 2014-10-16 NOTE — Telephone Encounter (Signed)
New Rx's sent for Glucometer and all supplies needed. See meds.

## 2014-10-24 ENCOUNTER — Other Ambulatory Visit: Payer: Self-pay | Admitting: Internal Medicine

## 2014-10-24 MED ORDER — GLUCOSE BLOOD VI STRP: ORAL_STRIP | Status: DC

## 2014-10-24 MED ORDER — ACCU-CHEK SOFTCLIX LANCET DEV MISC: Status: DC

## 2014-10-27 ENCOUNTER — Encounter: Payer: Self-pay | Admitting: Gastroenterology

## 2014-10-30 ENCOUNTER — Encounter: Payer: Self-pay | Admitting: Gastroenterology

## 2014-11-13 ENCOUNTER — Telehealth: Payer: Self-pay | Admitting: Internal Medicine

## 2014-11-13 DIAGNOSIS — Z86010 Personal history of colonic polyps: Secondary | ICD-10-CM

## 2014-11-13 NOTE — Telephone Encounter (Signed)
Pt wife called and said that pt needs a referral to get a coloscopy

## 2014-11-14 NOTE — Telephone Encounter (Signed)
Ok Thx 

## 2014-12-02 ENCOUNTER — Encounter: Payer: Self-pay | Admitting: Internal Medicine

## 2014-12-02 ENCOUNTER — Other Ambulatory Visit (INDEPENDENT_AMBULATORY_CARE_PROVIDER_SITE_OTHER): Payer: Commercial Managed Care - HMO

## 2014-12-02 ENCOUNTER — Ambulatory Visit (INDEPENDENT_AMBULATORY_CARE_PROVIDER_SITE_OTHER): Payer: Commercial Managed Care - HMO | Admitting: Internal Medicine

## 2014-12-02 VITALS — BP 140/70 | HR 60 | Wt 249.0 lb

## 2014-12-02 DIAGNOSIS — E785 Hyperlipidemia, unspecified: Secondary | ICD-10-CM

## 2014-12-02 DIAGNOSIS — Z23 Encounter for immunization: Secondary | ICD-10-CM | POA: Diagnosis not present

## 2014-12-02 DIAGNOSIS — E1151 Type 2 diabetes mellitus with diabetic peripheral angiopathy without gangrene: Secondary | ICD-10-CM

## 2014-12-02 DIAGNOSIS — E1159 Type 2 diabetes mellitus with other circulatory complications: Secondary | ICD-10-CM

## 2014-12-02 LAB — BASIC METABOLIC PANEL
BUN: 13 mg/dL (ref 6–23)
CHLORIDE: 105 meq/L (ref 96–112)
CO2: 30 mEq/L (ref 19–32)
CREATININE: 0.89 mg/dL (ref 0.40–1.50)
Calcium: 9.3 mg/dL (ref 8.4–10.5)
GFR: 108.64 mL/min (ref >60.00)
Glucose, Bld: 121 mg/dL — ABNORMAL HIGH (ref 70–99)
Potassium: 4.1 mEq/L (ref 3.5–5.1)
Sodium: 140 mEq/L (ref 135–145)

## 2014-12-02 LAB — HEMOGLOBIN A1C: Hgb A1c MFr Bld: 7.8 % — ABNORMAL HIGH (ref 4.6–6.5)

## 2014-12-02 NOTE — Progress Notes (Signed)
Pre visit review using our clinic review tool, if applicable. No additional management support is needed unless otherwise documented below in the visit note. 

## 2014-12-02 NOTE — Assessment & Plan Note (Signed)
Metformin, Glimepiride  

## 2014-12-02 NOTE — Progress Notes (Signed)
Subjective:  Patient ID: Johnny [name], male    DOB: [birth year]  Age: 69 y.o. MRN: [id number]  CC: No chief complaint on file.   HPI Johnny [name] presents for DM, HTN, DM f/u  Outpatient Prescriptions Prior to Visit  Medication Sig Dispense Refill  . albuterol (PROVENTIL HFA;VENTOLIN HFA) 108 (90 BASE) MCG/ACT inhaler Inhale 2 puffs into the lungs every 4 (four) hours as needed for wheezing or shortness of breath. 1 Inhaler 5  . Alcohol Swabs (B-D SINGLE USE SWABS REGULAR) PADS 1 each by Does not apply route 2 (two) times daily as needed. Dx: 250.00 100 each 3  . aspirin 325 MG EC tablet Take 325 mg by mouth every morning.    . Blood Glucose Calibration (ACCU-CHEK AVIVA) SOLN Use as directed as needed. 1 each 2  . Blood Glucose Monitoring Suppl (ACCU-CHEK AVIVA PLUS) W/DEVICE KIT Use twice daily as directed. Dx: 250.00 1 kit 0  . cholecalciferol (VITAMIN D) 1000 UNITS tablet Take 1,000 Units by mouth every morning.    . finasteride (PROSCAR) 5 MG tablet Take 1 tablet (5 mg total) by mouth every morning. 90 tablet 3  . glimepiride (AMARYL) 4 MG tablet Take 1 tablet (4 mg total) by mouth daily with breakfast. 90 tablet 3  . glucose blood (ACCU-CHEK AVIVA) test strip Use twice daily as instructed. Dx: 250.00 100 each 3  . Lancet Devices (ACCU-CHEK SOFTCLIX) lancets Use twice daily as instructed. Dx: 250.00 1 each 3  . losartan (COZAAR) 100 MG tablet Take 1 tablet (100 mg total) by mouth daily. 90 tablet 3  . lovastatin (MEVACOR) 40 MG tablet Take 1 tablet (40 mg total) by mouth every morning. 90 tablet 3  . metFORMIN (GLUCOPHAGE) 1000 MG tablet Take 1 tablet (1,000 mg total) by mouth 2 (two) times daily with a meal. 180 tablet 3  . nitroGLYCERIN (NITROSTAT) 0.4 MG SL tablet Place 1 tablet (0.4 mg total) under the tongue every 5 (five) minutes as needed for chest pain. 15 tablet 3  . pantoprazole (PROTONIX) 40 MG tablet Take 1 tablet (40 mg total) by mouth every morning. 90 tablet 3  .  tamsulosin (FLOMAX) 0.4 MG CAPS capsule      No facility-administered medications prior to visit.    ROS Review of Systems  Constitutional: Negative for appetite change, fatigue and unexpected weight change.  HENT: Negative for congestion, nosebleeds, sneezing, sore throat and trouble swallowing.   Eyes: Negative for itching and visual disturbance.  Respiratory: Negative for cough.   Cardiovascular: Negative for chest pain, palpitations and leg swelling.  Gastrointestinal: Negative for nausea, diarrhea, blood in stool and abdominal distention.  Genitourinary: Negative for frequency and hematuria.  Musculoskeletal: Negative for back pain, joint swelling, gait problem and neck pain.  Skin: Negative for rash.  Neurological: Negative for dizziness, tremors, speech difficulty and weakness.  Psychiatric/Behavioral: Negative for suicidal ideas, sleep disturbance, dysphoric mood and agitation. The patient is not nervous/anxious.     Objective:  BP 140/70 mmHg  Pulse 60  Wt 249 lb (112.946 kg)  SpO2 98%  BP Readings from Last 3 Encounters:  12/02/14 140/70  08/01/14 148/70  04/04/14 150/60    Wt Readings from Last 3 Encounters:  12/02/14 249 lb (112.946 kg)  08/01/14 250 lb (113.399 kg)  04/04/14 258 lb (117.028 kg)    Physical Exam  Constitutional: He is oriented to person, place, and time. He appears well-developed and well-nourished. No distress.  HENT:  Head: Normocephalic and  atraumatic.  Right Ear: External ear normal.  Left Ear: External ear normal.  Nose: Nose normal.  Mouth/Throat: Oropharynx is clear and moist. No oropharyngeal exudate.  Eyes: Conjunctivae and EOM are normal. Pupils are equal, round, and reactive to light. Right eye exhibits no discharge. Left eye exhibits no discharge. No scleral icterus.  Neck: Normal range of motion. Neck supple. No JVD present. No tracheal deviation present. No thyromegaly present.  Cardiovascular: Normal rate, regular rhythm,  normal heart sounds and intact distal pulses.  Exam reveals no gallop and no friction rub.   No murmur heard. Pulmonary/Chest: Effort normal and breath sounds normal. No stridor. No respiratory distress. He has no wheezes. He has no rales. He exhibits no tenderness.  Abdominal: Soft. Bowel sounds are normal. He exhibits no distension and no mass. There is no tenderness. There is no rebound and no guarding.  Genitourinary: Rectum normal, prostate normal and penis normal. Guaiac negative stool. No penile tenderness.  Musculoskeletal: Normal range of motion. He exhibits no edema or tenderness.  Lymphadenopathy:    He has no cervical adenopathy.  Neurological: He is alert and oriented to person, place, and time. He has normal reflexes. No cranial nerve deficit. He exhibits normal muscle tone. Coordination normal.  Skin: Skin is warm and dry. No rash noted. He is not diaphoretic. No erythema. No pallor.  Psychiatric: He has a normal mood and affect. His behavior is normal. Judgment and thought content normal.    Lab Results  Component Value Date   WBC 5.8 07/20/2013   HGB 11.4* 07/20/2013   HCT 35.3* 07/20/2013   PLT 258 07/20/2013   GLUCOSE 121* 12/02/2014   CHOL 143 11/29/2013   TRIG 182.0* 11/29/2013   HDL 32.70* 11/29/2013   LDLDIRECT 78.1 09/16/2008   LDLCALC 74 11/29/2013   ALT 13 01/21/2013   AST 13 01/21/2013   NA 140 12/02/2014   K 4.1 12/02/2014   CL 105 12/02/2014   CREATININE 0.89 12/02/2014   BUN 13 12/02/2014   CO2 30 12/02/2014   TSH 3.59 10/14/2010   PSA 1.28 10/24/2008   INR 1.15 01/21/2013   HGBA1C 7.8* 12/02/2014    Dg Chest 2 View (if Patient Has Fever And/or Copd)  07/20/2013   CLINICAL DATA:  Shortness of breath  EXAM: CHEST  2 VIEW  COMPARISON:  01/21/2013  FINDINGS: No cardiomegaly. Previous median sternotomy for CABG. Mild interstitial coarsening, including occasional Kerley B-lines. No consolidation, effusion, or pneumothorax.  IMPRESSION: Interstitial  coarsening which could be congestive or bronchitic.   Electronically Signed   By: Jorje Guild M.D.   On: 07/20/2013 09:30   Ct Angio Chest Pe W/cm &/or Wo Cm  07/20/2013   CLINICAL DATA:  Shortness of Breath  EXAM: CT ANGIOGRAPHY CHEST WITH CONTRAST  TECHNIQUE: Multidetector CT imaging of the chest was performed using the standard protocol during bolus administration of intravenous contrast. Multiplanar CT image reconstructions and MIPs were obtained to evaluate the vascular anatomy.  CONTRAST:  134mL OMNIPAQUE IOHEXOL 350 MG/ML SOLN  COMPARISON:  Chest radiograph Jul 20, 2013  FINDINGS: There is no demonstrable pulmonary embolus. There is no evidence of thoracic aortic aneurysm. There is atherosclerotic change in the aorta.  There is mild bibasilar atelectatic change. No edema or consolidation.  There is no appreciable thoracic adenopathy. Pericardium is not thickened. Patient is status post coronary artery bypass grafting.  There is a small hiatal hernia.  Visualized upper abdominal structures appear within normal limits. There are no blastic  or lytic bone lesions. There is degenerative change in the thoracic spine. Thyroid appears within normal limits.  Review of the MIP images confirms the above findings.  IMPRESSION: No demonstrable pulmonary embolus. No edema or consolidation. Small hiatal hernia. Patient is status post coronary artery bypass grafting.   Electronically Signed   By: Lowella Grip M.D.   On: 07/20/2013 11:28    Assessment & Plan:   Diagnoses and all orders for this visit:  DM type 2 causing vascular disease -     Hemoglobin A1c; Future -     Basic metabolic panel; Future  Need for influenza vaccination -     Flu Vaccine QUAD 36+ mos IM  Dyslipidemia   I am having Mr. Cozort maintain his nitroGLYCERIN, aspirin, cholecalciferol, albuterol, finasteride, glimepiride, losartan, lovastatin, metFORMIN, pantoprazole, tamsulosin, ACCU-CHEK AVIVA PLUS, ACCU-CHEK AVIVA, B-D SINGLE  USE SWABS REGULAR, glucose blood, accu-chek softclix, and ACCU-CHEK SOFTCLIX LANCETS.  Meds ordered this encounter  Medications  . ACCU-CHEK SOFTCLIX LANCETS lancets    Sig:      Follow-up: Return in about 3 months (around 03/03/2015) for a follow-up visit.  Walker Kehr, MD

## 2014-12-02 NOTE — Assessment & Plan Note (Signed)
On Lovastatin

## 2014-12-25 ENCOUNTER — Ambulatory Visit (AMBULATORY_SURGERY_CENTER): Payer: Self-pay | Admitting: *Deleted

## 2014-12-25 VITALS — Ht 72.0 in | Wt 250.0 lb

## 2014-12-25 DIAGNOSIS — Z86010 Personal history of colonic polyps: Secondary | ICD-10-CM

## 2014-12-25 MED ORDER — NA SULFATE-K SULFATE-MG SULF 17.5-3.13-1.6 GM/177ML PO SOLN: 1.0000 | Freq: Once | ORAL | Status: DC

## 2014-12-25 NOTE — Progress Notes (Signed)
No egg or soy allergy No issues with past sedation No diet pills No home 02 use  emmi to e mail

## 2015-01-08 ENCOUNTER — Ambulatory Visit (AMBULATORY_SURGERY_CENTER): Payer: Commercial Managed Care - HMO | Admitting: Gastroenterology

## 2015-01-08 ENCOUNTER — Encounter: Payer: Self-pay | Admitting: Gastroenterology

## 2015-01-08 VITALS — BP 158/69 | HR 54 | Temp 97.3°F | Resp 22 | Ht 72.0 in | Wt 250.0 lb

## 2015-01-08 DIAGNOSIS — I509 Heart failure, unspecified: Secondary | ICD-10-CM | POA: Diagnosis not present

## 2015-01-08 DIAGNOSIS — Z86010 Personal history of colonic polyps: Secondary | ICD-10-CM | POA: Diagnosis present

## 2015-01-08 DIAGNOSIS — K635 Polyp of colon: Secondary | ICD-10-CM

## 2015-01-08 DIAGNOSIS — D124 Benign neoplasm of descending colon: Secondary | ICD-10-CM

## 2015-01-08 DIAGNOSIS — Z1211 Encounter for screening for malignant neoplasm of colon: Secondary | ICD-10-CM | POA: Diagnosis not present

## 2015-01-08 DIAGNOSIS — D123 Benign neoplasm of transverse colon: Secondary | ICD-10-CM

## 2015-01-08 LAB — GLUCOSE, CAPILLARY
Glucose-Capillary: 187 mg/dL — ABNORMAL HIGH (ref 65–99)
Glucose-Capillary: 162 mg/dL — ABNORMAL HIGH (ref 65–99)

## 2015-01-08 MED ORDER — SODIUM CHLORIDE 0.9 % IV SOLN: 500.0000 mL | INTRAVENOUS | Status: DC

## 2015-01-08 NOTE — Patient Instructions (Signed)
YOU HAD AN ENDOSCOPIC PROCEDURE TODAY AT [facility]:   Refer to the procedure report that was given to you for any specific questions about what was found during the examination.  If the procedure report does not answer your questions, please call your gastroenterologist to clarify.  If you requested that your care partner not be given the details of your procedure findings, then the procedure report has been included in a sealed envelope for you to review at your convenience later.  YOU SHOULD EXPECT: Some feelings of bloating in the abdomen. Passage of more gas than usual.  Walking can help get rid of the air that was put into your GI tract during the procedure and reduce the bloating. If you had a lower endoscopy (such as a colonoscopy or flexible sigmoidoscopy) you may notice spotting of blood in your stool or on the toilet paper. If you underwent a bowel prep for your procedure, you may not have a normal bowel movement for a few days.  Please Note:  You might notice some irritation and congestion in your nose or some drainage.  This is from the oxygen used during your procedure.  There is no need for concern and it should clear up in a day or so.  SYMPTOMS TO REPORT IMMEDIATELY:   Following lower endoscopy (colonoscopy or flexible sigmoidoscopy):  Excessive amounts of blood in the stool  Significant tenderness or worsening of abdominal pains  Swelling of the abdomen that is new, acute  Fever of 100F or higher   For urgent or emergent issues, a gastroenterologist can be reached at any hour by calling [phone].   DIET: Your first meal following the procedure should be a small meal and then it is ok to progress to your normal diet. Heavy or fried foods are harder to digest and may make you feel nauseous or bloated.  Likewise, meals heavy in dairy and vegetables can increase bloating.  Drink plenty of fluids but you should avoid alcoholic beverages for 24  hours.  ACTIVITY:  You should plan to take it easy for the rest of today and you should NOT DRIVE or use heavy machinery until tomorrow (because of the sedation medicines used during the test).    FOLLOW UP: Our staff will call the number listed on your records the next business day following your procedure to check on you and address any questions or concerns that you may have regarding the information given to you following your procedure. If we do not reach you, we will leave a message.  However, if you are feeling well and you are not experiencing any problems, there is no need to return our call.  We will assume that you have returned to your regular daily activities without incident.  If any biopsies were taken you will be contacted by phone or by letter within the next 1-3 weeks.  Please call us at [phone] if you have not heard about the biopsies in 3 weeks.    SIGNATURES/CONFIDENTIALITY: You and/or your care partner have signed paperwork which will be entered into your electronic medical record.  These signatures attest to the fact that that the information above on your After Visit Summary has been reviewed and is understood.  Full responsibility of the confidentiality of this discharge information lies with you and/or your care-partner.  Polyp, diverticulosis, high fiber diet, information given.  No NSAIDS or aspirin for 2 weeks.

## 2015-01-08 NOTE — Progress Notes (Signed)
A/ox3 pleased with MAC, report to Jane RN 

## 2015-01-08 NOTE — Progress Notes (Signed)
Called to room to assist during endoscopic procedure.  Patient ID and intended procedure confirmed with present staff. Received instructions for my participation in the procedure from the performing physician.  

## 2015-01-08 NOTE — Op Note (Signed)
[place]  [address]. [place], [address]   COLONOSCOPY PROCEDURE REPORT PATIENT: [name], Johnny  MR#: [id number] BIRTHDATE: [birth year] , 69  yrs. old GENDER: male ENDOSCOPIST: Ladene Artist, MD, [facility] PROCEDURE DATE:  01/08/2015 PROCEDURE:   Colonoscopy, surveillance , Colonoscopy with biopsy, and Colonoscopy with snare polypectomy First Screening Colonoscopy - Avg.  risk and is 50 yrs.  old or older - No.  Prior Negative Screening - Now for repeat screening. N/A  History of Adenoma - Now for follow-up colonoscopy & has been > or = to 3 yrs.  Yes hx of adenoma.  Has been 3 or more years since last colonoscopy.  Polyps removed today? Yes ASA CLASS:   Class III INDICATIONS:Surveillance due to prior colonic neoplasia and PH Colon Adenoma. MEDICATIONS: Monitored anesthesia care and Propofol 200 mg IV DESCRIPTION OF PROCEDURE:   After the risks benefits and alternatives of the procedure were thoroughly explained, informed consent was obtained.  The digital rectal exam revealed no abnormalities of the rectum.   The LB [id number]  endoscope was introduced through the anus and advanced to the cecum, which was identified by both the appendix and ileocecal valve. No adverse events experienced.   The quality of the prep was good.  (Suprep was used)  The instrument was then slowly withdrawn as the colon was fully examined. Estimated blood loss is zero unless otherwise noted in this procedure report.  COLON FINDINGS: A sessile polyp measuring 8 mm in size was found in the transverse colon.  A polypectomy was performed using snare cautery.  The resection was complete, the polyp tissue was completely retrieved and sent to histology.   A sessile polyp measuring 4 mm in size was found in the descending colon.  A polypectomy was performed with cold forceps.  The resection was complete, the polyp tissue was completely retrieved and sent to histology.   There was  moderate diverticulosis noted in the descending colon, sigmoid colon, transverse colon, and at the splenic flexure with associated muscular hypertrophy and colonic spasm.   The examination was otherwise normal.  Retroflexed views revealed internal Grade I hemorrhoids. The time to cecum = 2.7 Withdrawal time = 12.8   The scope was withdrawn and the procedure completed. COMPLICATIONS: There were no immediate complications.  ENDOSCOPIC IMPRESSION: 1.   Sessile polyp in the transverse colon; polypectomy was performed using snare cautery 2.   Sessile polyp in the descending colon; polypectomy was performed with cold forceps 3.   Moderate diverticulosis in the descending colon, sigmoid colon, transverse colon, and at the splenic flexure 4.   Grade l internal hemorrhoids  RECOMMENDATIONS: 1.  Hold Aspirin and all other NSAIDS for 2 weeks. 2.  Await pathology results 3.  High fiber diet with liberal fluid intake. 4.  Repeat Colonoscopy in 5 years.  eSigned:  Ladene Artist, MD, [facility] 01/08/2015 10:51 AM

## 2015-01-09 ENCOUNTER — Telehealth: Payer: Self-pay | Admitting: *Deleted

## 2015-01-09 NOTE — Telephone Encounter (Signed)
  Follow up Call-  Call back number 01/08/2015  Post procedure Call Back phone  # [phone]  Permission to leave phone message Yes     Patient questions:  Do you have a fever, pain , or abdominal swelling? No. Pain Score  0 *  Have you tolerated food without any problems? Yes.    Have you been able to return to your normal activities? Yes.    Do you have any questions about your discharge instructions: Diet   No. Medications  No. Follow up visit  No.  Do you have questions or concerns about your Care? No.  Actions: * If pain score is 4 or above: No action needed, pain <4.  Spoke to wife, pt was still asleep

## 2015-01-15 ENCOUNTER — Encounter: Payer: Self-pay | Admitting: Gastroenterology

## 2015-01-22 ENCOUNTER — Telehealth: Payer: Self-pay

## 2015-01-22 NOTE — Telephone Encounter (Signed)
Patient called to educate on Medicare Wellness apt. LVM for the patient to call back to educate and schedule for wellness visit. Left phone numbers for practice as well as my own

## 2015-01-26 ENCOUNTER — Telehealth: Payer: Self-pay

## 2015-01-26 NOTE — Telephone Encounter (Signed)
Call back from the patient and declines AWV at this time; States he did have an eye exam and it was neg for glaucoma. fup with Dr. Camila Li in Nov and will have ua albumin at that time.

## 2015-01-26 NOTE — Telephone Encounter (Signed)
Call from Mr. Brakebill and his wife; Determined that CPE would be the first of next year and will hold this year; Does not want to make a separate visit at this time.  Reviewed o/d health screens; had eye exam this year and no issues;  Could not afford shingles; referred to insurance to ask for cost if taken at the doctor's office versus the pharmacy; Also will need urine albumin when he comes in to see Plotnikov in Nov.

## 2015-01-26 NOTE — Telephone Encounter (Signed)
Call to LVM regarding AWV this year; has fup apt on 12/27 and can schedule for that day as well; Left practice number and direct number for call back

## 2015-02-05 ENCOUNTER — Ambulatory Visit: Payer: Commercial Managed Care - HMO | Admitting: Internal Medicine

## 2015-03-03 ENCOUNTER — Ambulatory Visit: Payer: Commercial Managed Care - HMO | Admitting: Internal Medicine

## 2015-03-20 ENCOUNTER — Ambulatory Visit (INDEPENDENT_AMBULATORY_CARE_PROVIDER_SITE_OTHER): Payer: Commercial Managed Care - HMO | Admitting: Internal Medicine

## 2015-03-20 ENCOUNTER — Encounter: Payer: Self-pay | Admitting: Internal Medicine

## 2015-03-20 ENCOUNTER — Other Ambulatory Visit (INDEPENDENT_AMBULATORY_CARE_PROVIDER_SITE_OTHER): Payer: Commercial Managed Care - HMO

## 2015-03-20 VITALS — BP 150/80 | HR 73 | Wt 252.0 lb

## 2015-03-20 DIAGNOSIS — E1159 Type 2 diabetes mellitus with other circulatory complications: Secondary | ICD-10-CM

## 2015-03-20 DIAGNOSIS — N320 Bladder-neck obstruction: Secondary | ICD-10-CM | POA: Diagnosis not present

## 2015-03-20 LAB — BASIC METABOLIC PANEL
BUN: 11 mg/dL (ref 6–23)
CO2: 30 mEq/L (ref 19–32)
CREATININE: 0.99 mg/dL (ref 0.40–1.50)
Calcium: 9.1 mg/dL (ref 8.4–10.5)
Chloride: 103 mEq/L (ref 96–112)
GFR: 96 mL/min (ref >60.00)
GLUCOSE: 229 mg/dL — AB (ref 70–99)
POTASSIUM: 4.1 meq/L (ref 3.5–5.1)
Sodium: 140 mEq/L (ref 135–145)

## 2015-03-20 LAB — HEMOGLOBIN A1C: Hgb A1c MFr Bld: 8.3 % — ABNORMAL HIGH (ref 4.6–6.5)

## 2015-03-20 LAB — PSA: PSA: 0.8 ng/mL (ref 0.10–4.00)

## 2015-03-20 MED ORDER — TAMSULOSIN HCL 0.4 MG PO CAPS: 0.4000 mg | ORAL_CAPSULE | Freq: Every day | ORAL | Status: DC

## 2015-03-20 NOTE — Assessment & Plan Note (Signed)
Chronic  Metformin, Glimepiride  

## 2015-03-20 NOTE — Assessment & Plan Note (Signed)
Chronic  Tamsulosin, Finesteride 

## 2015-03-20 NOTE — Assessment & Plan Note (Signed)
Pt is planning to diet  - "The biggest looser"

## 2015-03-20 NOTE — Progress Notes (Signed)
Subjective:  Patient ID: Johnny [name], male    DOB: 07-04-1945  Age: 70 y.o. MRN: [id number]  CC: No chief complaint on file.   HPI Johnny [name] presents for DM, HTN, CAD f/u. BP is nl at home  Outpatient Prescriptions Prior to Visit  Medication Sig Dispense Refill  . ACCU-CHEK SOFTCLIX LANCETS lancets     . albuterol (PROVENTIL HFA;VENTOLIN HFA) 108 (90 BASE) MCG/ACT inhaler Inhale 2 puffs into the lungs every 4 (four) hours as needed for wheezing or shortness of breath. 1 Inhaler 5  . Alcohol Swabs (B-D SINGLE USE SWABS REGULAR) PADS 1 each by Does not apply route 2 (two) times daily as needed. Dx: 250.00 100 each 3  . aspirin 325 MG EC tablet Take 325 mg by mouth every morning.    . Blood Glucose Calibration (ACCU-CHEK AVIVA) SOLN Use as directed as needed. 1 each 2  . Blood Glucose Monitoring Suppl (ACCU-CHEK AVIVA PLUS) W/DEVICE KIT Use twice daily as directed. Dx: 250.00 1 kit 0  . cholecalciferol (VITAMIN D) 1000 UNITS tablet Take 1,000 Units by mouth every morning.    . finasteride (PROSCAR) 5 MG tablet Take 1 tablet (5 mg total) by mouth every morning. 90 tablet 3  . glimepiride (AMARYL) 4 MG tablet Take 1 tablet (4 mg total) by mouth daily with breakfast. 90 tablet 3  . glucose blood (ACCU-CHEK AVIVA) test strip Use twice daily as instructed. Dx: 250.00 100 each 3  . Lancet Devices (ACCU-CHEK SOFTCLIX) lancets Use twice daily as instructed. Dx: 250.00 1 each 3  . losartan (COZAAR) 100 MG tablet Take 1 tablet (100 mg total) by mouth daily. 90 tablet 3  . lovastatin (MEVACOR) 40 MG tablet Take 1 tablet (40 mg total) by mouth every morning. 90 tablet 3  . metFORMIN (GLUCOPHAGE) 1000 MG tablet Take 1 tablet (1,000 mg total) by mouth 2 (two) times daily with a meal. 180 tablet 3  . nitroGLYCERIN (NITROSTAT) 0.4 MG SL tablet Place 1 tablet (0.4 mg total) under the tongue every 5 (five) minutes as needed for chest pain. 15 tablet 3  . pantoprazole (PROTONIX) 40 MG tablet Take 1  tablet (40 mg total) by mouth every morning. 90 tablet 3  . tamsulosin (FLOMAX) 0.4 MG CAPS capsule      No facility-administered medications prior to visit.    ROS Review of Systems  Constitutional: Positive for unexpected weight change. Negative for appetite change and fatigue.  HENT: Negative for congestion, nosebleeds, sneezing, sore throat and trouble swallowing.   Eyes: Negative for itching and visual disturbance.  Respiratory: Negative for cough.   Cardiovascular: Negative for chest pain, palpitations and leg swelling.  Gastrointestinal: Negative for nausea, diarrhea, blood in stool and abdominal distention.  Genitourinary: Negative for frequency and hematuria.  Musculoskeletal: Negative for back pain, joint swelling, gait problem and neck pain.  Skin: Negative for rash.  Neurological: Negative for dizziness, tremors, speech difficulty and weakness.  Psychiatric/Behavioral: Negative for sleep disturbance, dysphoric mood and agitation. The patient is not nervous/anxious.     Objective:  BP 150/80 mmHg  Pulse 73  Wt 252 lb (114.306 kg)  SpO2 97%  BP Readings from Last 3 Encounters:  03/20/15 150/80  01/08/15 158/69  12/02/14 140/70    Wt Readings from Last 3 Encounters:  03/20/15 252 lb (114.306 kg)  01/08/15 250 lb (113.399 kg)  12/25/14 250 lb (113.399 kg)    Physical Exam  Constitutional: He is oriented to person, place, and  time. He appears well-developed. No distress.  NAD  HENT:  Mouth/Throat: Oropharynx is clear and moist.  Eyes: Conjunctivae are normal. Pupils are equal, round, and reactive to light.  Neck: Normal range of motion. No JVD present. No thyromegaly present.  Cardiovascular: Normal rate, regular rhythm, normal heart sounds and intact distal pulses.  Exam reveals no gallop and no friction rub.   No murmur heard. Pulmonary/Chest: Effort normal and breath sounds normal. No respiratory distress. He has no wheezes. He has no rales. He exhibits no  tenderness.  Abdominal: Soft. Bowel sounds are normal. He exhibits no distension and no mass. There is no tenderness. There is no rebound and no guarding.  Musculoskeletal: Normal range of motion. He exhibits no edema or tenderness.  Lymphadenopathy:    He has no cervical adenopathy.  Neurological: He is alert and oriented to person, place, and time. He has normal reflexes. No cranial nerve deficit. He exhibits normal muscle tone. He displays a negative Romberg sign. Coordination and gait normal.  Skin: Skin is warm and dry. No rash noted.  Psychiatric: He has a normal mood and affect. His behavior is normal. Judgment and thought content normal.    Lab Results  Component Value Date   WBC 5.8 07/20/2013   HGB 11.4* 07/20/2013   HCT 35.3* 07/20/2013   PLT 258 07/20/2013   GLUCOSE 229* 03/20/2015   CHOL 143 11/29/2013   TRIG 182.0* 11/29/2013   HDL 32.70* 11/29/2013   LDLDIRECT 78.1 09/16/2008   LDLCALC 74 11/29/2013   ALT 13 01/21/2013   AST 13 01/21/2013   NA 140 03/20/2015   K 4.1 03/20/2015   CL 103 03/20/2015   CREATININE 0.99 03/20/2015   BUN 11 03/20/2015   CO2 30 03/20/2015   TSH 3.59 10/14/2010   PSA 0.80 03/20/2015   INR 1.15 01/21/2013   HGBA1C 8.3* 03/20/2015    Dg Chest 2 View (if Patient Has Fever And/or Copd)  07/20/2013  CLINICAL DATA:  Shortness of breath EXAM: CHEST  2 VIEW COMPARISON:  01/21/2013 FINDINGS: No cardiomegaly. Previous median sternotomy for CABG. Mild interstitial coarsening, including occasional Kerley B-lines. No consolidation, effusion, or pneumothorax. IMPRESSION: Interstitial coarsening which could be congestive or bronchitic. Electronically Signed   By: Jorje Guild M.D.   On: 07/20/2013 09:30   Ct Angio Chest Pe W/cm &/or Wo Cm  07/20/2013  CLINICAL DATA:  Shortness of Breath EXAM: CT ANGIOGRAPHY CHEST WITH CONTRAST TECHNIQUE: Multidetector CT imaging of the chest was performed using the standard protocol during bolus administration of  intravenous contrast. Multiplanar CT image reconstructions and MIPs were obtained to evaluate the vascular anatomy. CONTRAST:  162m OMNIPAQUE IOHEXOL 350 MG/ML SOLN COMPARISON:  Chest radiograph Jul 20, 2013 FINDINGS: There is no demonstrable pulmonary embolus. There is no evidence of thoracic aortic aneurysm. There is atherosclerotic change in the aorta. There is mild bibasilar atelectatic change. No edema or consolidation. There is no appreciable thoracic adenopathy. Pericardium is not thickened. Patient is status post coronary artery bypass grafting. There is a small hiatal hernia. Visualized upper abdominal structures appear within normal limits. There are no blastic or lytic bone lesions. There is degenerative change in the thoracic spine. Thyroid appears within normal limits. Review of the MIP images confirms the above findings. IMPRESSION: No demonstrable pulmonary embolus. No edema or consolidation. Small hiatal hernia. Patient is status post coronary artery bypass grafting. Electronically Signed   By: WLowella GripM.D.   On: 07/20/2013 11:28    Assessment &  Plan:   Diagnoses and all orders for this visit:  DM type 2 causing vascular disease ([place]) -     Basic metabolic panel; Future -     Hemoglobin A1c; Future  BLADDER OUTLET OBSTRUCTION -     PSA; Future  Other orders -     tamsulosin (FLOMAX) 0.4 MG CAPS capsule; Take 1 capsule (0.4 mg total) by mouth daily after supper.  I have changed Johnny [name] tamsulosin. I am also having him maintain his nitroGLYCERIN, aspirin, cholecalciferol, albuterol, finasteride, glimepiride, losartan, lovastatin, metFORMIN, pantoprazole, ACCU-CHEK AVIVA PLUS, ACCU-CHEK AVIVA, B-D SINGLE USE SWABS REGULAR, glucose blood, accu-chek softclix, and ACCU-CHEK SOFTCLIX LANCETS.  Meds ordered this encounter  Medications  . tamsulosin (FLOMAX) 0.4 MG CAPS capsule    Sig: Take 1 capsule (0.4 mg total) by mouth daily after supper.    Dispense:  30 capsule     Refill:  11     Follow-up: Return in about 3 months (around 06/18/2015) for Wellness Exam.  Walker Kehr, MD

## 2015-03-20 NOTE — Progress Notes (Signed)
Pre visit review using our clinic review tool, if applicable. No additional management support is needed unless otherwise documented below in the visit note. 

## 2015-04-21 ENCOUNTER — Other Ambulatory Visit: Payer: Self-pay | Admitting: Internal Medicine

## 2015-04-24 ENCOUNTER — Telehealth: Payer: Self-pay

## 2015-04-24 NOTE — Telephone Encounter (Signed)
PA initiated via CoverMyMeds, [facility]

## 2015-04-27 NOTE — Telephone Encounter (Signed)
PA Approved via CoverMyMeds 

## 2015-04-27 NOTE — Telephone Encounter (Signed)
Approved thru 2/48/2019

## 2015-04-30 ENCOUNTER — Telehealth: Payer: Self-pay | Admitting: *Deleted

## 2015-04-30 ENCOUNTER — Telehealth: Payer: Self-pay

## 2015-04-30 NOTE — Telephone Encounter (Signed)
Handicap Placard completed and placed on MD's desk for signature  Call [phone] when ready

## 2015-04-30 NOTE — Telephone Encounter (Signed)
Received call from [facility] needing to clarify rx for Tamulosin & Doxazosin spoke with christy gave her directions for both med. rx was ssent to them on 2/14...[name]

## 2015-05-20 ENCOUNTER — Encounter: Payer: Self-pay | Admitting: Internal Medicine

## 2015-05-20 ENCOUNTER — Ambulatory Visit (INDEPENDENT_AMBULATORY_CARE_PROVIDER_SITE_OTHER): Payer: Commercial Managed Care - HMO | Admitting: Internal Medicine

## 2015-05-20 VITALS — BP 140/84 | HR 72 | Temp 97.9°F | Wt 248.0 lb

## 2015-05-20 DIAGNOSIS — L03119 Cellulitis of unspecified part of limb: Secondary | ICD-10-CM | POA: Insufficient documentation

## 2015-05-20 DIAGNOSIS — J019 Acute sinusitis, unspecified: Secondary | ICD-10-CM | POA: Insufficient documentation

## 2015-05-20 DIAGNOSIS — J0100 Acute maxillary sinusitis, unspecified: Secondary | ICD-10-CM | POA: Diagnosis not present

## 2015-05-20 DIAGNOSIS — R21 Rash and other nonspecific skin eruption: Secondary | ICD-10-CM | POA: Diagnosis not present

## 2015-05-20 MED ORDER — DOXYCYCLINE HYCLATE 100 MG PO TABS: 100.0000 mg | ORAL_TABLET | Freq: Two times a day (BID) | ORAL | Status: DC

## 2015-05-20 MED ORDER — DIPHENHYDRAMINE HCL 25 MG PO CAPS: 25.0000 mg | ORAL_CAPSULE | Freq: Four times a day (QID) | ORAL | Status: DC | PRN

## 2015-05-20 NOTE — Progress Notes (Signed)
Pre visit review using our clinic review tool, if applicable. No additional management support is needed unless otherwise documented below in the visit note. 

## 2015-05-20 NOTE — Assessment & Plan Note (Signed)
3/17 L 2nd toe Doxy x 10 d

## 2015-05-20 NOTE — Assessment & Plan Note (Signed)
Doxy x 10 d 

## 2015-05-20 NOTE — Assessment & Plan Note (Signed)
3/17 due to eating a black cherry ice cream x2 - hives

## 2015-05-20 NOTE — Progress Notes (Signed)
Subjective:  Patient ID: Johnny [name], male    DOB: Dec 19, 1945  Age: 70 y.o. MRN: [id number]  CC: No chief complaint on file.   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 ACCU-CHEK SOFTCLIX LANCETS lancets     . Alcohol Swabs (B-D SINGLE USE SWABS REGULAR) PADS 1 each by Does not apply route 2 (two) times daily as needed. Dx: 250.00 100 each 3  . aspirin 325 MG EC tablet Take 325 mg by mouth every morning.    . Blood Glucose Calibration (ACCU-CHEK AVIVA) SOLN Use as directed as needed. 1 each 2  . Blood Glucose Monitoring Suppl (ACCU-CHEK AVIVA PLUS) W/DEVICE KIT Use twice daily as directed. Dx: 250.00 1 kit 0  . cholecalciferol (VITAMIN D) 1000 UNITS tablet Take 1,000 Units by mouth every morning.    [name] doxazosin (CARDURA) 8 MG tablet TAKE 1 TABLET EVERY MORNING 90 tablet 3  . finasteride (PROSCAR) 5 MG tablet TAKE 1 TABLET EVERY MORNING 90 tablet 3  . glimepiride (AMARYL) 4 MG tablet TAKE 1 TABLET EVERY DAY WITH BREAKFAST 90 tablet 3  . glucose blood (ACCU-CHEK AVIVA) test strip Use twice daily as instructed. Dx: 250.00 100 each 3  . Lancet Devices (ACCU-CHEK SOFTCLIX) lancets Use twice daily as instructed. Dx: 250.00 1 each 3  . losartan (COZAAR) 100 MG tablet TAKE 1 TABLET EVERY DAY 90 tablet 3  . lovastatin (MEVACOR) 40 MG tablet TAKE 1 TABLET EVERY MORNING 90 tablet 3  . metFORMIN (GLUCOPHAGE) 1000 MG tablet TAKE 1 TABLET TWICE DAILY WITH MEALS 180 tablet 3  . nitroGLYCERIN (NITROSTAT) 0.4 MG SL tablet Place 1 tablet (0.4 mg total) under the tongue every 5 (five) minutes as needed for chest pain. 15 tablet 3  . pantoprazole (PROTONIX) 40 MG tablet TAKE 1 TABLET EVERY MORNING 90 tablet 3  . tamsulosin (FLOMAX) 0.4 MG CAPS capsule TAKE 1 CAPSULE EVERY DAY 90 capsule 3  . VENTOLIN HFA 108 (90 Base) MCG/ACT inhaler  INHALE 2 PUFFS INTO THE LUNGS EVERY 4 HOURS AS NEEDED FOR WHEEZING OR SHORTNESS OF BREATH 18 g 5   No facility-administered medications prior to visit.    ROS Review of Systems  Constitutional: Negative for appetite change, fatigue and unexpected weight change.  HENT: Positive for congestion, postnasal drip, rhinorrhea and sinus pressure. Negative for nosebleeds, sneezing, sore throat and trouble swallowing.   Eyes: Negative for itching and visual disturbance.  Respiratory: Negative for cough.   Cardiovascular: Negative for chest pain, palpitations and leg swelling.  Gastrointestinal: Negative for nausea, diarrhea, blood in stool and abdominal distention.  Genitourinary: Negative for frequency and hematuria.  Musculoskeletal: Negative for back pain, joint swelling, gait problem and neck pain.  Skin: Positive for rash and wound.  Neurological: Negative for dizziness, tremors, speech difficulty and weakness.  Psychiatric/Behavioral: Negative for sleep disturbance, dysphoric mood and agitation. The patient is not nervous/anxious.     Objective:  BP 140/84 mmHg  Pulse 72  Temp(Src) 97.9 F (36.6 C) (Oral)  Wt 248 lb (112.492 kg)  SpO2 98%  BP Readings from Last 3 Encounters:  05/20/15 140/84  03/20/15 150/80  01/08/15 158/69    Wt Readings from Last 3 Encounters:  05/20/15 248 lb (112.492 kg)  03/20/15 252 lb (114.306 kg)  01/08/15 250 lb (113.399 kg)  Physical Exam  Constitutional: He is oriented to person, place, and time. He appears well-developed. No distress.  NAD  HENT:  Mouth/Throat: Oropharynx is clear and moist.  Eyes: Conjunctivae are normal. Pupils are equal, round, and reactive to light.  Neck: Normal range of motion. No JVD present. No thyromegaly present.  Cardiovascular: Normal rate, regular rhythm, normal heart sounds and intact distal pulses.  Exam reveals no gallop and no friction rub.   No murmur heard. Pulmonary/Chest: Effort normal and breath sounds  normal. No respiratory distress. He has no wheezes. He has no rales. He exhibits no tenderness.  Abdominal: Soft. Bowel sounds are normal. He exhibits no distension and no mass. There is no tenderness. There is no rebound and no guarding.  Musculoskeletal: Normal range of motion. He exhibits tenderness. He exhibits no edema.  Lymphadenopathy:    He has no cervical adenopathy.  Neurological: He is alert and oriented to person, place, and time. He has normal reflexes. No cranial nerve deficit. He exhibits normal muscle tone. He displays a negative Romberg sign. Coordination and gait normal.  Skin: Skin is warm and dry. Rash noted. There is erythema.  Psychiatric: He has a normal mood and affect. His behavior is normal. Judgment and thought content normal.  L 2nd toe with 1 mm and erythema T pedis changes   Lab Results  Component Value Date   WBC 5.8 07/20/2013   HGB 11.4* 07/20/2013   HCT 35.3* 07/20/2013   PLT 258 07/20/2013   GLUCOSE 229* 03/20/2015   CHOL 143 11/29/2013   TRIG 182.0* 11/29/2013   HDL 32.70* 11/29/2013   LDLDIRECT 78.1 09/16/2008   LDLCALC 74 11/29/2013   ALT 13 01/21/2013   AST 13 01/21/2013   NA 140 03/20/2015   K 4.1 03/20/2015   CL 103 03/20/2015   CREATININE 0.99 03/20/2015   BUN 11 03/20/2015   CO2 30 03/20/2015   TSH 3.59 10/14/2010   PSA 0.80 03/20/2015   INR 1.15 01/21/2013   HGBA1C 8.3* 03/20/2015    Dg Chest 2 View (if Patient Has Fever And/or Copd)  07/20/2013  CLINICAL DATA:  Shortness of breath EXAM: CHEST  2 VIEW COMPARISON:  01/21/2013 FINDINGS: No cardiomegaly. Previous median sternotomy for CABG. Mild interstitial coarsening, including occasional Kerley B-lines. No consolidation, effusion, or pneumothorax. IMPRESSION: Interstitial coarsening which could be congestive or bronchitic. Electronically Signed   By: Jorje Guild M.D.   On: 07/20/2013 09:30   Ct Angio Chest Pe W/cm &/or Wo Cm  07/20/2013  CLINICAL DATA:  Shortness of Breath EXAM:  CT ANGIOGRAPHY CHEST WITH CONTRAST TECHNIQUE: Multidetector CT imaging of the chest was performed using the standard protocol during bolus administration of intravenous contrast. Multiplanar CT image reconstructions and MIPs were obtained to evaluate the vascular anatomy. CONTRAST:  161m OMNIPAQUE IOHEXOL 350 MG/ML SOLN COMPARISON:  Chest radiograph Jul 20, 2013 FINDINGS: There is no demonstrable pulmonary embolus. There is no evidence of thoracic aortic aneurysm. There is atherosclerotic change in the aorta. There is mild bibasilar atelectatic change. No edema or consolidation. There is no appreciable thoracic adenopathy. Pericardium is not thickened. Patient is status post coronary artery bypass grafting. There is a small hiatal hernia. Visualized upper abdominal structures appear within normal limits. There are no blastic or lytic bone lesions. There is degenerative change in the thoracic spine. Thyroid appears within normal limits. Review of the MIP images confirms the above findings. IMPRESSION: No demonstrable pulmonary embolus. No edema or consolidation. Small hiatal hernia. Patient is  status post coronary artery bypass grafting. Electronically Signed   By: Lowella Grip M.D.   On: 07/20/2013 11:28    Assessment & Plan:   Diagnoses and all orders for this visit:  Rash and nonspecific skin eruption  Cellulitis of foot  Acute maxillary sinusitis, recurrence not specified  Other orders -     Discontinue: doxycycline (VIBRA-TABS) 100 MG tablet; Take 1 tablet (100 mg total) by mouth 2 (two) times daily. -     Discontinue: diphenhydrAMINE (BENADRYL) 25 mg capsule; Take 1-2 capsules (25-50 mg total) by mouth every 6 (six) hours as needed for itching.  I am having Mr. Blume maintain his nitroGLYCERIN, aspirin, cholecalciferol, ACCU-CHEK AVIVA PLUS, ACCU-CHEK AVIVA, B-D SINGLE USE SWABS REGULAR, glucose blood, accu-chek softclix, ACCU-CHEK SOFTCLIX LANCETS, VENTOLIN HFA, doxazosin, tamsulosin,  glimepiride, lovastatin, pantoprazole, finasteride, metFORMIN, and losartan.  Meds ordered this encounter  Medications  . DISCONTD: doxycycline (VIBRA-TABS) 100 MG tablet    Sig: Take 1 tablet (100 mg total) by mouth 2 (two) times daily.    Dispense:  20 tablet    Refill:  0  . DISCONTD: diphenhydrAMINE (BENADRYL) 25 mg capsule    Sig: Take 1-2 capsules (25-50 mg total) by mouth every 6 (six) hours as needed for itching.    Dispense:  60 capsule    Refill:  1     Follow-up: Return in about 3 months (around 08/20/2015).  Walker Kehr, MD

## 2015-05-21 ENCOUNTER — Telehealth: Payer: Self-pay | Admitting: Internal Medicine

## 2015-05-21 MED ORDER — DIPHENHYDRAMINE HCL 25 MG PO CAPS: 25.0000 mg | ORAL_CAPSULE | Freq: Four times a day (QID) | ORAL | Status: DC | PRN

## 2015-05-21 MED ORDER — DOXYCYCLINE HYCLATE 100 MG PO TABS
100.0000 mg | ORAL_TABLET | Freq: Two times a day (BID) | ORAL | Status: DC
Start: 2015-05-21 — End: 2015-06-12

## 2015-05-21 NOTE — Telephone Encounter (Signed)
Done. See meds.  

## 2015-05-21 NOTE — Telephone Encounter (Signed)
Pt was seen yesterday and his medication was sent to the mail order. Can you please resend these to [facility] on [address]

## 2015-06-12 ENCOUNTER — Ambulatory Visit (INDEPENDENT_AMBULATORY_CARE_PROVIDER_SITE_OTHER): Payer: Commercial Managed Care - HMO | Admitting: Internal Medicine

## 2015-06-12 ENCOUNTER — Encounter: Payer: Self-pay | Admitting: Internal Medicine

## 2015-06-12 ENCOUNTER — Telehealth: Payer: Self-pay

## 2015-06-12 VITALS — BP 170/98 | HR 58 | Temp 98.2°F | Resp 14 | Ht 71.0 in | Wt 250.0 lb

## 2015-06-12 DIAGNOSIS — Z0000 Encounter for general adult medical examination without abnormal findings: Secondary | ICD-10-CM

## 2015-06-12 DIAGNOSIS — N320 Bladder-neck obstruction: Secondary | ICD-10-CM | POA: Diagnosis not present

## 2015-06-12 DIAGNOSIS — J0100 Acute maxillary sinusitis, unspecified: Secondary | ICD-10-CM | POA: Diagnosis not present

## 2015-06-12 MED ORDER — PROMETHAZINE-CODEINE 6.25-10 MG/5ML PO SYRP: 5.0000 mL | ORAL_SOLUTION | ORAL | Status: DC | PRN

## 2015-06-12 MED ORDER — CEFDINIR 300 MG PO CAPS: 300.0000 mg | ORAL_CAPSULE | Freq: Two times a day (BID) | ORAL | Status: DC

## 2015-06-12 NOTE — Assessment & Plan Note (Signed)
Labs

## 2015-06-12 NOTE — Progress Notes (Signed)
Subjective:  Patient ID: Johnny [name], male    DOB: [birth year]  Age: 70 y.o. MRN: [id number]  CC: No chief complaint on file.   HPI BRAXDON GAPPA presents for a well exam C/o cough and hoarsness x 1 month - worse on Doxy  Outpatient Prescriptions Prior to Visit  Medication Sig Dispense Refill  . ACCU-CHEK SOFTCLIX LANCETS lancets     . Alcohol Swabs (B-D SINGLE USE SWABS REGULAR) PADS 1 each by Does not apply route 2 (two) times daily as needed. Dx: 250.00 100 each 3  . aspirin 325 MG EC tablet Take 325 mg by mouth every morning.    . Blood Glucose Calibration (ACCU-CHEK AVIVA) SOLN Use as directed as needed. 1 each 2  . Blood Glucose Monitoring Suppl (ACCU-CHEK AVIVA PLUS) W/DEVICE KIT Use twice daily as directed. Dx: 250.00 1 kit 0  . cholecalciferol (VITAMIN D) 1000 UNITS tablet Take 1,000 Units by mouth every morning.    . diphenhydrAMINE (BENADRYL) 25 mg capsule Take 1-2 capsules (25-50 mg total) by mouth every 6 (six) hours as needed for itching. 60 capsule 1  . doxazosin (CARDURA) 8 MG tablet TAKE 1 TABLET EVERY MORNING 90 tablet 3  . finasteride (PROSCAR) 5 MG tablet TAKE 1 TABLET EVERY MORNING 90 tablet 3  . glimepiride (AMARYL) 4 MG tablet TAKE 1 TABLET EVERY DAY WITH BREAKFAST 90 tablet 3  . glucose blood (ACCU-CHEK AVIVA) test strip Use twice daily as instructed. Dx: 250.00 100 each 3  . Lancet Devices (ACCU-CHEK SOFTCLIX) lancets Use twice daily as instructed. Dx: 250.00 1 each 3  . losartan (COZAAR) 100 MG tablet TAKE 1 TABLET EVERY DAY 90 tablet 3  . lovastatin (MEVACOR) 40 MG tablet TAKE 1 TABLET EVERY MORNING 90 tablet 3  . metFORMIN (GLUCOPHAGE) 1000 MG tablet TAKE 1 TABLET TWICE DAILY WITH MEALS 180 tablet 3  . nitroGLYCERIN (NITROSTAT) 0.4 MG SL tablet Place 1 tablet (0.4 mg total) under the tongue every 5 (five) minutes as needed for chest pain. 15 tablet 3  . pantoprazole (PROTONIX) 40 MG tablet TAKE 1 TABLET EVERY MORNING 90 tablet 3  . tamsulosin (FLOMAX)  0.4 MG CAPS capsule TAKE 1 CAPSULE EVERY DAY 90 capsule 3  . VENTOLIN HFA 108 (90 Base) MCG/ACT inhaler INHALE 2 PUFFS INTO THE LUNGS EVERY 4 HOURS AS NEEDED FOR WHEEZING OR SHORTNESS OF BREATH 18 g 5  . doxycycline (VIBRA-TABS) 100 MG tablet Take 1 tablet (100 mg total) by mouth 2 (two) times daily. (Patient not taking: Reported on 06/12/2015) 20 tablet 0   No facility-administered medications prior to visit.    ROS Review of Systems  Constitutional: Negative for appetite change, fatigue and unexpected weight change.  HENT: Negative for congestion, nosebleeds, sneezing, sore throat and trouble swallowing.   Eyes: Negative for itching and visual disturbance.  Respiratory: Negative for cough.   Cardiovascular: Negative for chest pain, palpitations and leg swelling.  Gastrointestinal: Negative for nausea, diarrhea, blood in stool and abdominal distention.  Genitourinary: Negative for frequency and hematuria.  Musculoskeletal: Positive for arthralgias. Negative for back pain, joint swelling, gait problem, neck pain and neck stiffness.  Skin: Negative for rash.  Neurological: Negative for dizziness, tremors, speech difficulty and weakness.  Psychiatric/Behavioral: Negative for suicidal ideas, sleep disturbance, dysphoric mood, decreased concentration and agitation. The patient is not nervous/anxious.     Objective:  BP 170/98 mmHg  Pulse 58  Temp(Src) 98.2 F (36.8 C) (Oral)  Resp 14  Ht _0  (  1.803 m)  Wt 250 lb (113.399 kg)  BMI 34.88 kg/m2  SpO2 98%  BP Readings from Last 3 Encounters:  06/12/15 170/98  05/20/15 140/84  03/20/15 150/80    Wt Readings from Last 3 Encounters:  06/12/15 250 lb (113.399 kg)  05/20/15 248 lb (112.492 kg)  03/20/15 252 lb (114.306 kg)    Physical Exam  Constitutional: He is oriented to person, place, and time. He appears well-developed and well-nourished. No distress.  HENT:  Head: Normocephalic and atraumatic.  Right Ear: External ear  normal.  Left Ear: External ear normal.  Nose: Nose normal.  Mouth/Throat: Oropharynx is clear and moist. No oropharyngeal exudate.  Eyes: Conjunctivae and EOM are normal. Pupils are equal, round, and reactive to light. Right eye exhibits no discharge. Left eye exhibits no discharge. No scleral icterus.  Neck: Normal range of motion. Neck supple. No JVD present. No tracheal deviation present. No thyromegaly present.  Cardiovascular: Normal rate, regular rhythm, normal heart sounds and intact distal pulses.  Exam reveals no gallop and no friction rub.   No murmur heard. Pulmonary/Chest: Effort normal and breath sounds normal. No stridor. No respiratory distress. He has no wheezes. He has no rales. He exhibits no tenderness.  Abdominal: Soft. Bowel sounds are normal. He exhibits no distension and no mass. There is no tenderness. There is no rebound and no guarding.  Genitourinary: Rectum normal and prostate normal. Guaiac negative stool. No penile tenderness.  Musculoskeletal: Normal range of motion. He exhibits tenderness. He exhibits no edema.  Lymphadenopathy:    He has no cervical adenopathy.  Neurological: He is alert and oriented to person, place, and time. He has normal reflexes. No cranial nerve deficit. He exhibits normal muscle tone. Coordination normal.  Skin: Skin is warm and dry. No rash noted. He is not diaphoretic. No erythema. No pallor.  Psychiatric: He has a normal mood and affect. His behavior is normal. Judgment and thought content normal.  prostate is 1+ NT eryth nares   Lab Results  Component Value Date   WBC 5.8 07/20/2013   HGB 11.4* 07/20/2013   HCT 35.3* 07/20/2013   PLT 258 07/20/2013   GLUCOSE 229* 03/20/2015   CHOL 143 11/29/2013   TRIG 182.0* 11/29/2013   HDL 32.70* 11/29/2013   LDLDIRECT 78.1 09/16/2008   LDLCALC 74 11/29/2013   ALT 13 01/21/2013   AST 13 01/21/2013   NA 140 03/20/2015   K 4.1 03/20/2015   CL 103 03/20/2015   CREATININE 0.99  03/20/2015   BUN 11 03/20/2015   CO2 30 03/20/2015   TSH 3.59 10/14/2010   PSA 0.80 03/20/2015   INR 1.15 01/21/2013   HGBA1C 8.3* 03/20/2015    Dg Chest 2 View (if Patient Has Fever And/or Copd)  07/20/2013  CLINICAL DATA:  Shortness of breath EXAM: CHEST  2 VIEW COMPARISON:  01/21/2013 FINDINGS: No cardiomegaly. Previous median sternotomy for CABG. Mild interstitial coarsening, including occasional Kerley B-lines. No consolidation, effusion, or pneumothorax. IMPRESSION: Interstitial coarsening which could be congestive or bronchitic. Electronically Signed   By: Tiburcio Pea M.D.   On: 07/20/2013 09:30   Ct Angio Chest Pe W/cm &/or Wo Cm  07/20/2013  CLINICAL DATA:  Shortness of Breath EXAM: CT ANGIOGRAPHY CHEST WITH CONTRAST TECHNIQUE: Multidetector CT imaging of the chest was performed using the standard protocol during bolus administration of intravenous contrast. Multiplanar CT image reconstructions and MIPs were obtained to evaluate the vascular anatomy. CONTRAST:  OMNIPAQUE IOHEXOL 350 MG/ML SOLN COMPARISON:  Chest radiograph Jul 20, 2013 FINDINGS: There is no demonstrable pulmonary embolus. There is no evidence of thoracic aortic aneurysm. There is atherosclerotic change in the aorta. There is mild bibasilar atelectatic change. No edema or consolidation. There is no appreciable thoracic adenopathy. Pericardium is not thickened. Patient is status post coronary artery bypass grafting. There is a small hiatal hernia. Visualized upper abdominal structures appear within normal limits. There are no blastic or lytic bone lesions. There is degenerative change in the thoracic spine. Thyroid appears within normal limits. Review of the MIP images confirms the above findings. IMPRESSION: No demonstrable pulmonary embolus. No edema or consolidation. Small hiatal hernia. Patient is status post coronary artery bypass grafting. Electronically Signed   By: Lowella Grip M.D.   On: 07/20/2013 11:28      Assessment & Plan:   There are no diagnoses linked to this encounter. I have discontinued Mr. Kissner doxycycline. I am also having him maintain his nitroGLYCERIN, aspirin, cholecalciferol, ACCU-CHEK AVIVA PLUS, ACCU-CHEK AVIVA, B-D SINGLE USE SWABS REGULAR, glucose blood, accu-chek softclix, ACCU-CHEK SOFTCLIX LANCETS, VENTOLIN HFA, doxazosin, tamsulosin, glimepiride, lovastatin, pantoprazole, finasteride, metFORMIN, losartan, and diphenhydrAMINE.  No orders of the defined types were placed in this encounter.     Follow-up: No Follow-up on file.  Walker Kehr, MD

## 2015-06-12 NOTE — Telephone Encounter (Signed)
Patient in for CPE; Discussed AWV but states he and his wife participate in [facility] "wellness program". States the are both doing aerobics 4 times a week. BP elevated today 170/68. States he has sausage this am; stated to drink water and try to have a low sodium day tomorrow. States his wife tracks his BP at home and it is generally low; Agreed to call to fup on BP next week for readings;   Needs a hep c exam and foot exam  A1c trending up; Results for TAAVI, NIEMELA (MRN [id number]) as of 06/12/2015 14:34  Ref. Range 04/04/2014 14:11 08/07/2014 10:16 12/02/2014 14:49 03/20/2015 15:28  Hemoglobin A1C Latest Ref Range: 4.6-6.5 % 7.8 (H) 7.4 (H) 7.8 (H) 8.3 (H)   Wt BMI 34.9  Will fup in one week

## 2015-06-12 NOTE — Progress Notes (Signed)
Pre visit review using our clinic review tool, if applicable. No additional management support is needed unless otherwise documented below in the visit note. 

## 2015-06-12 NOTE — Patient Instructions (Signed)

## 2015-06-12 NOTE — Assessment & Plan Note (Signed)
Worse Cefdinir Rx Prom--Cod syr

## 2015-06-25 NOTE — Telephone Encounter (Signed)
fup with Johnny [name] regarding BP reading; left message for call back

## 2015-07-02 MED ORDER — VALSARTAN-HYDROCHLOROTHIAZIDE 320-12.5 MG PO TABS: 1.0000 | ORAL_TABLET | Freq: Every day | ORAL | Status: DC

## 2015-07-02 NOTE — Telephone Encounter (Signed)
Call Mr. Fluellen to check on BP; Ms. Mikrut stated she tried to call back last week; Not sure when BP were checked; Agreed to check BP 2 to 3 times this week and next and will call with readings. Also states Mr. Shasteen has been wheezing for approx one month; Has gained a little weight; No chest pain; no sob; Agreed to come in and see Dr. Alain Marion for evaluation and referral if indicated; Apt scheduled for May 4th at 8am.   Just FYI

## 2015-07-02 NOTE — Telephone Encounter (Signed)
Noted D/c Losartan Start Valsartan-HCT Thx

## 2015-07-03 NOTE — Telephone Encounter (Signed)
fup and had to leave VM that new BP med ordered and should be available at the pharmacy.

## 2015-07-06 NOTE — Telephone Encounter (Signed)
Call from Johnny [name]; stated BP  4/28 164/76 P 65 4/29 134/72 P 64 4/30 178/81 p 72 5/1 149/86 P 72   Call to note that new RX for BP medication should be at pharmacy and to call if there are issues;

## 2015-07-09 ENCOUNTER — Ambulatory Visit: Payer: Commercial Managed Care - HMO | Admitting: Internal Medicine

## 2015-07-13 ENCOUNTER — Encounter: Payer: Self-pay | Admitting: Internal Medicine

## 2015-07-13 ENCOUNTER — Ambulatory Visit (INDEPENDENT_AMBULATORY_CARE_PROVIDER_SITE_OTHER): Payer: Commercial Managed Care - HMO | Admitting: Internal Medicine

## 2015-07-13 ENCOUNTER — Ambulatory Visit (INDEPENDENT_AMBULATORY_CARE_PROVIDER_SITE_OTHER)
Admission: RE | Admit: 2015-07-13 | Discharge: 2015-07-13 | Disposition: A | Discharge status: Home / Self Care | Payer: Commercial Managed Care - HMO | Source: Ambulatory Visit | Attending: Internal Medicine | Admitting: Internal Medicine

## 2015-07-13 VITALS — BP 120/58 | HR 69 | Wt 244.0 lb

## 2015-07-13 DIAGNOSIS — E1169 Type 2 diabetes mellitus with other specified complication: Secondary | ICD-10-CM

## 2015-07-13 DIAGNOSIS — R0602 Shortness of breath: Secondary | ICD-10-CM | POA: Diagnosis not present

## 2015-07-13 DIAGNOSIS — E1159 Type 2 diabetes mellitus with other circulatory complications: Secondary | ICD-10-CM | POA: Diagnosis not present

## 2015-07-13 DIAGNOSIS — J4520 Mild intermittent asthma, uncomplicated: Secondary | ICD-10-CM | POA: Diagnosis not present

## 2015-07-13 DIAGNOSIS — I10 Essential (primary) hypertension: Secondary | ICD-10-CM

## 2015-07-13 DIAGNOSIS — J45909 Unspecified asthma, uncomplicated: Secondary | ICD-10-CM | POA: Insufficient documentation

## 2015-07-13 DIAGNOSIS — N529 Male erectile dysfunction, unspecified: Secondary | ICD-10-CM | POA: Insufficient documentation

## 2015-07-13 DIAGNOSIS — N521 Erectile dysfunction due to diseases classified elsewhere: Secondary | ICD-10-CM | POA: Diagnosis not present

## 2015-07-13 MED ORDER — FLUTICASONE FUROATE-VILANTEROL 100-25 MCG/INH IN AEPB: 1.0000 | INHALATION_SPRAY | Freq: Every day | RESPIRATORY_TRACT | Status: DC

## 2015-07-13 NOTE — Progress Notes (Signed)
Pre visit review using our clinic review tool, if applicable. No additional management support is needed unless otherwise documented below in the visit note. 

## 2015-07-13 NOTE — Assessment & Plan Note (Addendum)
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

## 2015-07-13 NOTE — Patient Instructions (Signed)
Stop Losartan and Doxazosin

## 2015-07-13 NOTE — Assessment & Plan Note (Signed)
D/c Losartan, Doxazosin

## 2015-07-13 NOTE — Assessment & Plan Note (Signed)
Chronic  Metformin, Glimepiride  

## 2015-07-13 NOTE — Progress Notes (Signed)
Subjective:  Patient ID: Johnny [name], male    DOB: 1945/11/06  Age: 70 y.o. MRN: [id number]  CC: No chief complaint on file.   HPI CORTEZ FLIPPEN presents for HTN, wheezing, cough  Outpatient Prescriptions Prior to Visit  Medication Sig Dispense Refill  . ACCU-CHEK SOFTCLIX LANCETS lancets     . Alcohol Swabs (B-D SINGLE USE SWABS REGULAR) PADS 1 each by Does not apply route 2 (two) times daily as needed. Dx: 250.00 100 each 3  . aspirin 325 MG EC tablet Take 325 mg by mouth every morning.    . Blood Glucose Calibration (ACCU-CHEK AVIVA) SOLN Use as directed as needed. 1 each 2  . Blood Glucose Monitoring Suppl (ACCU-CHEK AVIVA PLUS) W/DEVICE KIT Use twice daily as directed. Dx: 250.00 1 kit 0  . cholecalciferol (VITAMIN D) 1000 UNITS tablet Take 1,000 Units by mouth every morning.    . diphenhydrAMINE (BENADRYL) 25 mg capsule Take 1-2 capsules (25-50 mg total) by mouth every 6 (six) hours as needed for itching. 60 capsule 1  . finasteride (PROSCAR) 5 MG tablet TAKE 1 TABLET EVERY MORNING 90 tablet 3  . glimepiride (AMARYL) 4 MG tablet TAKE 1 TABLET EVERY DAY WITH BREAKFAST 90 tablet 3  . glucose blood (ACCU-CHEK AVIVA) test strip Use twice daily as instructed. Dx: 250.00 100 each 3  . Lancet Devices (ACCU-CHEK SOFTCLIX) lancets Use twice daily as instructed. Dx: 250.00 1 each 3  . lovastatin (MEVACOR) 40 MG tablet TAKE 1 TABLET EVERY MORNING 90 tablet 3  . metFORMIN (GLUCOPHAGE) 1000 MG tablet TAKE 1 TABLET TWICE DAILY WITH MEALS 180 tablet 3  . nitroGLYCERIN (NITROSTAT) 0.4 MG SL tablet Place 1 tablet (0.4 mg total) under the tongue every 5 (five) minutes as needed for chest pain. 15 tablet 3  . pantoprazole (PROTONIX) 40 MG tablet TAKE 1 TABLET EVERY MORNING 90 tablet 3  . tamsulosin (FLOMAX) 0.4 MG CAPS capsule TAKE 1 CAPSULE EVERY DAY 90 capsule 3  . valsartan-hydrochlorothiazide (DIOVAN-HCT) 320-12.5 MG tablet Take 1 tablet by mouth daily. 30 tablet 11  . VENTOLIN HFA 108 (90  Base) MCG/ACT inhaler INHALE 2 PUFFS INTO THE LUNGS EVERY 4 HOURS AS NEEDED FOR WHEEZING OR SHORTNESS OF BREATH 18 g 5  . doxazosin (CARDURA) 8 MG tablet TAKE 1 TABLET EVERY MORNING 90 tablet 3   No facility-administered medications prior to visit.    ROS Review of Systems  Constitutional: Negative for appetite change, fatigue and unexpected weight change.  HENT: Negative for congestion, nosebleeds, sneezing, sore throat and trouble swallowing.   Eyes: Negative for itching and visual disturbance.  Respiratory: Negative for cough.   Cardiovascular: Negative for chest pain, palpitations and leg swelling.  Gastrointestinal: Negative for nausea, diarrhea, blood in stool and abdominal distention.  Genitourinary: Negative for frequency and hematuria.  Musculoskeletal: Negative for back pain, joint swelling, gait problem and neck pain.  Skin: Negative for rash.  Neurological: Negative for dizziness, tremors, speech difficulty and weakness.  Psychiatric/Behavioral: Negative for suicidal ideas, sleep disturbance, dysphoric mood and agitation. The patient is not nervous/anxious.     Objective:  BP 120/58 mmHg  Pulse 69  Wt 244 lb (110.678 kg)  SpO2 98%  BP Readings from Last 3 Encounters:  07/13/15 120/58  06/12/15 170/98  05/20/15 140/84    Wt Readings from Last 3 Encounters:  07/13/15 244 lb (110.678 kg)  06/12/15 250 lb (113.399 kg)  05/20/15 248 lb (112.492 kg)    Physical Exam  Constitutional:  He is oriented to person, place, and time. He appears well-developed. No distress.  NAD  HENT:  Mouth/Throat: Oropharynx is clear and moist.  Eyes: Conjunctivae are normal. Pupils are equal, round, and reactive to light.  Neck: Normal range of motion. No JVD present. No thyromegaly present.  Cardiovascular: Normal rate, regular rhythm, normal heart sounds and intact distal pulses.  Exam reveals no gallop and no friction rub.   No murmur heard. Pulmonary/Chest: Effort normal and breath  sounds normal. No respiratory distress. He has no wheezes. He has no rales. He exhibits no tenderness.  Abdominal: Soft. Bowel sounds are normal. He exhibits no distension and no mass. There is no tenderness. There is no rebound and no guarding.  Musculoskeletal: Normal range of motion. He exhibits no edema or tenderness.  Lymphadenopathy:    He has no cervical adenopathy.  Neurological: He is alert and oriented to person, place, and time. He has normal reflexes. No cranial nerve deficit. He exhibits normal muscle tone. He displays a negative Romberg sign. Coordination and gait normal.  Skin: Skin is warm and dry. No rash noted.  Psychiatric: He has a normal mood and affect. His behavior is normal. Judgment and thought content normal.    Lab Results  Component Value Date   WBC 5.8 07/20/2013   HGB 11.4* 07/20/2013   HCT 35.3* 07/20/2013   PLT 258 07/20/2013   GLUCOSE 229* 03/20/2015   CHOL 143 11/29/2013   TRIG 182.0* 11/29/2013   HDL 32.70* 11/29/2013   LDLDIRECT 78.1 09/16/2008   LDLCALC 74 11/29/2013   ALT 13 01/21/2013   AST 13 01/21/2013   NA 140 03/20/2015   K 4.1 03/20/2015   CL 103 03/20/2015   CREATININE 0.99 03/20/2015   BUN 11 03/20/2015   CO2 30 03/20/2015   TSH 3.59 10/14/2010   PSA 0.80 03/20/2015   INR 1.15 01/21/2013   HGBA1C 8.3* 03/20/2015    Dg Chest 2 View (if Patient Has Fever And/or Copd)  07/20/2013  CLINICAL DATA:  Shortness of breath EXAM: CHEST  2 VIEW COMPARISON:  01/21/2013 FINDINGS: No cardiomegaly. Previous median sternotomy for CABG. Mild interstitial coarsening, including occasional Kerley B-lines. No consolidation, effusion, or pneumothorax. IMPRESSION: Interstitial coarsening which could be congestive or bronchitic. Electronically Signed   By: Jorje Guild M.D.   On: 07/20/2013 09:30   Ct Angio Chest Pe W/cm &/or Wo Cm  07/20/2013  CLINICAL DATA:  Shortness of Breath EXAM: CT ANGIOGRAPHY CHEST WITH CONTRAST TECHNIQUE: Multidetector CT  imaging of the chest was performed using the standard protocol during bolus administration of intravenous contrast. Multiplanar CT image reconstructions and MIPs were obtained to evaluate the vascular anatomy. CONTRAST:  126m OMNIPAQUE IOHEXOL 350 MG/ML SOLN COMPARISON:  Chest radiograph Jul 20, 2013 FINDINGS: There is no demonstrable pulmonary embolus. There is no evidence of thoracic aortic aneurysm. There is atherosclerotic change in the aorta. There is mild bibasilar atelectatic change. No edema or consolidation. There is no appreciable thoracic adenopathy. Pericardium is not thickened. Patient is status post coronary artery bypass grafting. There is a small hiatal hernia. Visualized upper abdominal structures appear within normal limits. There are no blastic or lytic bone lesions. There is degenerative change in the thoracic spine. Thyroid appears within normal limits. Review of the MIP images confirms the above findings. IMPRESSION: No demonstrable pulmonary embolus. No edema or consolidation. Small hiatal hernia. Patient is status post coronary artery bypass grafting. Electronically Signed   By: WLowella GripM.D.   On:  07/20/2013 11:28    Assessment & Plan:   Diagnoses and all orders for this visit:  Asthmatic bronchitis, mild intermittent, uncomplicated -     DG Chest 2 View  Erectile dysfunction associated with type 2 diabetes mellitus ([place]) -     Ambulatory referral to Urology  Other orders -     fluticasone furoate-vilanterol (BREO ELLIPTA) 100-25 MCG/INH AEPB; Inhale 1 puff into the lungs daily.  I have discontinued Mr. Hyslop doxazosin. I am also having him start on fluticasone furoate-vilanterol. Additionally, I am having him maintain his nitroGLYCERIN, aspirin, cholecalciferol, ACCU-CHEK AVIVA PLUS, ACCU-CHEK AVIVA, B-D SINGLE USE SWABS REGULAR, glucose blood, accu-chek softclix, ACCU-CHEK SOFTCLIX LANCETS, VENTOLIN HFA, tamsulosin, glimepiride, lovastatin, pantoprazole,  finasteride, metFORMIN, diphenhydrAMINE, and valsartan-hydrochlorothiazide.  Meds ordered this encounter  Medications  . fluticasone furoate-vilanterol (BREO ELLIPTA) 100-25 MCG/INH AEPB    Sig: Inhale 1 puff into the lungs daily.    Dispense:  1 each    Refill:  5     Follow-up: Return in about 2 months (around 09/12/2015) for a follow-up visit.  Walker Kehr, MD

## 2015-07-13 NOTE — Assessment & Plan Note (Signed)
Start Breo CXR

## 2015-08-21 DIAGNOSIS — H26493 Other secondary cataract, bilateral: Secondary | ICD-10-CM | POA: Diagnosis not present

## 2015-08-21 DIAGNOSIS — H35033 Hypertensive retinopathy, bilateral: Secondary | ICD-10-CM | POA: Diagnosis not present

## 2015-08-21 DIAGNOSIS — E119 Type 2 diabetes mellitus without complications: Secondary | ICD-10-CM | POA: Diagnosis not present

## 2015-08-21 DIAGNOSIS — H04123 Dry eye syndrome of bilateral lacrimal glands: Secondary | ICD-10-CM | POA: Diagnosis not present

## 2015-08-21 DIAGNOSIS — H35342 Macular cyst, hole, or pseudohole, left eye: Secondary | ICD-10-CM | POA: Diagnosis not present

## 2015-08-28 DIAGNOSIS — H35342 Macular cyst, hole, or pseudohole, left eye: Secondary | ICD-10-CM | POA: Diagnosis not present

## 2015-08-28 DIAGNOSIS — H43811 Vitreous degeneration, right eye: Secondary | ICD-10-CM | POA: Diagnosis not present

## 2015-08-28 DIAGNOSIS — H33322 Round hole, left eye: Secondary | ICD-10-CM | POA: Diagnosis not present

## 2015-09-10 DIAGNOSIS — H33322 Round hole, left eye: Secondary | ICD-10-CM | POA: Diagnosis not present

## 2015-09-10 DIAGNOSIS — H35342 Macular cyst, hole, or pseudohole, left eye: Secondary | ICD-10-CM | POA: Diagnosis not present

## 2015-09-10 DIAGNOSIS — H33302 Unspecified retinal break, left eye: Secondary | ICD-10-CM | POA: Diagnosis not present

## 2015-09-11 DIAGNOSIS — H35342 Macular cyst, hole, or pseudohole, left eye: Secondary | ICD-10-CM | POA: Diagnosis not present

## 2015-09-18 DIAGNOSIS — H35342 Macular cyst, hole, or pseudohole, left eye: Secondary | ICD-10-CM | POA: Diagnosis not present

## 2015-09-18 DIAGNOSIS — Z48810 Encounter for surgical aftercare following surgery on the sense organs: Secondary | ICD-10-CM | POA: Diagnosis not present

## 2015-10-13 ENCOUNTER — Other Ambulatory Visit (INDEPENDENT_AMBULATORY_CARE_PROVIDER_SITE_OTHER): Payer: Commercial Managed Care - HMO

## 2015-10-13 ENCOUNTER — Encounter: Payer: Self-pay | Admitting: Internal Medicine

## 2015-10-13 ENCOUNTER — Ambulatory Visit (INDEPENDENT_AMBULATORY_CARE_PROVIDER_SITE_OTHER): Payer: Commercial Managed Care - HMO | Admitting: Internal Medicine

## 2015-10-13 VITALS — BP 118/68 | HR 56 | Wt 249.0 lb

## 2015-10-13 DIAGNOSIS — I2510 Atherosclerotic heart disease of native coronary artery without angina pectoris: Secondary | ICD-10-CM | POA: Diagnosis not present

## 2015-10-13 DIAGNOSIS — E1159 Type 2 diabetes mellitus with other circulatory complications: Secondary | ICD-10-CM

## 2015-10-13 DIAGNOSIS — Z951 Presence of aortocoronary bypass graft: Secondary | ICD-10-CM | POA: Diagnosis not present

## 2015-10-13 DIAGNOSIS — I10 Essential (primary) hypertension: Secondary | ICD-10-CM

## 2015-10-13 DIAGNOSIS — I259 Chronic ischemic heart disease, unspecified: Secondary | ICD-10-CM | POA: Diagnosis not present

## 2015-10-13 LAB — BASIC METABOLIC PANEL
BUN: 16 mg/dL (ref 6–23)
CALCIUM: 9.4 mg/dL (ref 8.4–10.5)
CO2: 28 mEq/L (ref 19–32)
CREATININE: 0.96 mg/dL (ref 0.40–1.50)
Chloride: 102 mEq/L (ref 96–112)
GFR: 99.3 mL/min (ref >60.00)
Glucose, Bld: 139 mg/dL — ABNORMAL HIGH (ref 70–99)
Potassium: 3.8 mEq/L (ref 3.5–5.1)
Sodium: 138 mEq/L (ref 135–145)

## 2015-10-13 LAB — HEMOGLOBIN A1C: HEMOGLOBIN A1C: 7.8 % — AB (ref 4.6–6.5)

## 2015-10-13 NOTE — Assessment & Plan Note (Signed)
Wt Readings from Last 3 Encounters:  10/13/15 249 lb (112.9 kg)  07/13/15 244 lb (110.7 kg)  06/12/15 250 lb (113.4 kg)

## 2015-10-13 NOTE — Assessment & Plan Note (Signed)
Losartan, NTG prn, Lovastatin, ASA Labs

## 2015-10-13 NOTE — Assessment & Plan Note (Signed)
Diovan-HCT 

## 2015-10-13 NOTE — Progress Notes (Signed)
Pre visit review using our clinic review tool, if applicable. No additional management support is needed unless otherwise documented below in the visit note. 

## 2015-10-13 NOTE — Assessment & Plan Note (Addendum)
Labs  Metformin, Glimepiride  

## 2015-10-13 NOTE — Progress Notes (Signed)
Subjective:  Patient ID: Johnny [name], male    DOB: [birth year]  Age: 70 y.o. MRN: [id number]  CC: No chief complaint on file.   HPI ORELL HURTADO presents for CAD, HTN, DM f/u  Outpatient Medications Prior to Visit  Medication Sig Dispense Refill  . ACCU-CHEK SOFTCLIX LANCETS lancets     . Alcohol Swabs (B-D SINGLE USE SWABS REGULAR) PADS 1 each by Does not apply route 2 (two) times daily as needed. Dx: 250.00 100 each 3  . aspirin 325 MG EC tablet Take 325 mg by mouth every morning.    . Blood Glucose Calibration (ACCU-CHEK AVIVA) SOLN Use as directed as needed. 1 each 2  . Blood Glucose Monitoring Suppl (ACCU-CHEK AVIVA PLUS) W/DEVICE KIT Use twice daily as directed. Dx: 250.00 1 kit 0  . cholecalciferol (VITAMIN D) 1000 UNITS tablet Take 1,000 Units by mouth every morning.    . diphenhydrAMINE (BENADRYL) 25 mg capsule Take 1-2 capsules (25-50 mg total) by mouth every 6 (six) hours as needed for itching. 60 capsule 1  . finasteride (PROSCAR) 5 MG tablet TAKE 1 TABLET EVERY MORNING 90 tablet 3  . fluticasone furoate-vilanterol (BREO ELLIPTA) 100-25 MCG/INH AEPB Inhale 1 puff into the lungs daily. 1 each 5  . glimepiride (AMARYL) 4 MG tablet TAKE 1 TABLET EVERY DAY WITH BREAKFAST 90 tablet 3  . glucose blood (ACCU-CHEK AVIVA) test strip Use twice daily as instructed. Dx: 250.00 100 each 3  . Lancet Devices (ACCU-CHEK SOFTCLIX) lancets Use twice daily as instructed. Dx: 250.00 1 each 3  . lovastatin (MEVACOR) 40 MG tablet TAKE 1 TABLET EVERY MORNING 90 tablet 3  . metFORMIN (GLUCOPHAGE) 1000 MG tablet TAKE 1 TABLET TWICE DAILY WITH MEALS 180 tablet 3  . nitroGLYCERIN (NITROSTAT) 0.4 MG SL tablet Place 1 tablet (0.4 mg total) under the tongue every 5 (five) minutes as needed for chest pain. 15 tablet 3  . pantoprazole (PROTONIX) 40 MG tablet TAKE 1 TABLET EVERY MORNING 90 tablet 3  . tamsulosin (FLOMAX) 0.4 MG CAPS capsule TAKE 1 CAPSULE EVERY DAY 90 capsule 3  .  valsartan-hydrochlorothiazide (DIOVAN-HCT) 320-12.5 MG tablet Take 1 tablet by mouth daily. 30 tablet 11  . VENTOLIN HFA 108 (90 Base) MCG/ACT inhaler INHALE 2 PUFFS INTO THE LUNGS EVERY 4 HOURS AS NEEDED FOR WHEEZING OR SHORTNESS OF BREATH 18 g 5   No facility-administered medications prior to visit.     ROS Review of Systems  Constitutional: Negative for appetite change, fatigue and unexpected weight change.  HENT: Negative for congestion, nosebleeds, sneezing, sore throat and trouble swallowing.   Eyes: Negative for itching and visual disturbance.  Respiratory: Negative for cough.   Cardiovascular: Negative for chest pain, palpitations and leg swelling.  Gastrointestinal: Negative for abdominal distention, blood in stool, diarrhea and nausea.  Genitourinary: Negative for frequency and hematuria.  Musculoskeletal: Positive for arthralgias and gait problem. Negative for back pain, joint swelling and neck pain.  Skin: Negative for rash.  Neurological: Negative for dizziness, tremors, speech difficulty and weakness.  Psychiatric/Behavioral: Negative for agitation, dysphoric mood and sleep disturbance. The patient is not nervous/anxious.     Objective:  BP 118/68   Pulse (!) 56   Wt 249 lb (112.9 kg)   SpO2 98%   BMI 34.73 kg/m   BP Readings from Last 3 Encounters:  10/13/15 118/68  07/13/15 (!) 120/58  06/12/15 (!) 170/98    Wt Readings from Last 3 Encounters:  10/13/15 249 lb (112.9 kg)  07/13/15 244 lb (110.7 kg)  06/12/15 250 lb (113.4 kg)    Physical Exam  Constitutional: He is oriented to person, place, and time. He appears well-developed and well-nourished. No distress.  HENT:  Head: Normocephalic and atraumatic.  Right Ear: External ear normal.  Left Ear: External ear normal.  Nose: Nose normal.  Mouth/Throat: Oropharynx is clear and moist. No oropharyngeal exudate.  Eyes: Conjunctivae and EOM are normal. Pupils are equal, round, and reactive to light. Right eye  exhibits no discharge. Left eye exhibits no discharge. No scleral icterus.  Neck: Normal range of motion. Neck supple. No JVD present. No tracheal deviation present. No thyromegaly present.  Cardiovascular: Normal rate, regular rhythm, normal heart sounds and intact distal pulses.  Exam reveals no gallop and no friction rub.   No murmur heard. Pulmonary/Chest: Effort normal and breath sounds normal. No stridor. No respiratory distress. He has no wheezes. He has no rales. He exhibits no tenderness.  Abdominal: Soft. Bowel sounds are normal. He exhibits no distension and no mass. There is no tenderness. There is no rebound and no guarding.  Genitourinary: Rectum normal, prostate normal and penis normal. Rectal exam shows guaiac negative stool. No penile tenderness.  Musculoskeletal: Normal range of motion. He exhibits no edema or tenderness.  Lymphadenopathy:    He has no cervical adenopathy.  Neurological: He is alert and oriented to person, place, and time. He has normal reflexes. No cranial nerve deficit. He exhibits normal muscle tone.  Skin: Skin is warm and dry. No rash noted. He is not diaphoretic. No erythema. No pallor.  Psychiatric: He has a normal mood and affect. His behavior is normal. Judgment and thought content normal.    Lab Results  Component Value Date   WBC 5.8 07/20/2013   HGB 11.4 (L) 07/20/2013   HCT 35.3 (L) 07/20/2013   PLT 258 07/20/2013   GLUCOSE 229 (H) 03/20/2015   CHOL 143 11/29/2013   TRIG 182.0 (H) 11/29/2013   HDL 32.70 (L) 11/29/2013   LDLDIRECT 78.1 09/16/2008   LDLCALC 74 11/29/2013   ALT 13 01/21/2013   AST 13 01/21/2013   NA 140 03/20/2015   K 4.1 03/20/2015   CL 103 03/20/2015   CREATININE 0.99 03/20/2015   BUN 11 03/20/2015   CO2 30 03/20/2015   TSH 3.59 10/14/2010   PSA 0.80 03/20/2015   INR 1.15 01/21/2013   HGBA1C 8.3 (H) 03/20/2015    Dg Chest 2 View  Result Date: 07/14/2015 CLINICAL DATA:  Shortness of breath and wheezing for 2  weeks EXAM: CHEST  2 VIEW COMPARISON:  07/20/2013 FINDINGS: Cardiac shadow is stable. Postsurgical changes are again seen. The lungs are well aerated bilaterally without evidence of focal infiltrate or sizable effusion. Moderate degenerative changes of thoracic spine are noted. IMPRESSION: No active cardiopulmonary disease. Electronically Signed   By: Inez Catalina M.D.   On: 07/14/2015 08:25    Assessment & Plan:   There are no diagnoses linked to this encounter. I am having Mr. Pascuzzi maintain his nitroGLYCERIN, aspirin, cholecalciferol, ACCU-CHEK AVIVA PLUS, ACCU-CHEK AVIVA, B-D SINGLE USE SWABS REGULAR, glucose blood, accu-chek softclix, ACCU-CHEK SOFTCLIX LANCETS, VENTOLIN HFA, tamsulosin, glimepiride, lovastatin, pantoprazole, finasteride, metFORMIN, diphenhydrAMINE, valsartan-hydrochlorothiazide, and fluticasone furoate-vilanterol.  No orders of the defined types were placed in this encounter.    Follow-up: No Follow-up on file.  Walker Kehr, MD

## 2015-11-11 ENCOUNTER — Other Ambulatory Visit: Payer: Self-pay | Admitting: *Deleted

## 2015-11-11 MED ORDER — VALSARTAN-HYDROCHLOROTHIAZIDE 320-12.5 MG PO TABS: 1.0000 | ORAL_TABLET | Freq: Every day | ORAL | 2 refills | Status: DC

## 2015-11-11 NOTE — Telephone Encounter (Signed)
Rec'd fax pt requesting refill on Valsartan/HCTZ. Sent electronically...Johnny [name]

## 2015-12-24 DIAGNOSIS — E119 Type 2 diabetes mellitus without complications: Secondary | ICD-10-CM | POA: Diagnosis not present

## 2015-12-24 DIAGNOSIS — H35033 Hypertensive retinopathy, bilateral: Secondary | ICD-10-CM | POA: Diagnosis not present

## 2015-12-24 DIAGNOSIS — H26493 Other secondary cataract, bilateral: Secondary | ICD-10-CM | POA: Diagnosis not present

## 2015-12-24 DIAGNOSIS — H35342 Macular cyst, hole, or pseudohole, left eye: Secondary | ICD-10-CM | POA: Diagnosis not present

## 2015-12-24 DIAGNOSIS — H04123 Dry eye syndrome of bilateral lacrimal glands: Secondary | ICD-10-CM | POA: Diagnosis not present

## 2016-01-15 ENCOUNTER — Ambulatory Visit: Payer: Commercial Managed Care - HMO | Admitting: Internal Medicine

## 2016-02-04 ENCOUNTER — Telehealth: Payer: Self-pay | Admitting: Emergency Medicine

## 2016-02-04 NOTE — Telephone Encounter (Signed)
Pts wife called and stated patient missed his appt on 01/15/16. She found the appt card but stated she never got a reminder card. She is wondering if there is something we can do about the $50 no show fee. Both phone numbers in the chart were correct. Please advise thanks.

## 2016-02-05 ENCOUNTER — Telehealth: Payer: Self-pay | Admitting: Internal Medicine

## 2016-02-05 NOTE — Telephone Encounter (Signed)
email sent to billing to remove no show as a courtesy

## 2016-02-05 NOTE — Telephone Encounter (Signed)
Pt wife came in and said that she wanted to make sure that she did not want to get charged for the appt in Nov.  She stated that this was cancelled and should not charged.  She talk plot about this and she said that if there is any issues to talk to him

## 2016-02-08 ENCOUNTER — Encounter: Payer: Self-pay | Admitting: Internal Medicine

## 2016-02-08 ENCOUNTER — Other Ambulatory Visit: Payer: Commercial Managed Care - HMO

## 2016-02-08 ENCOUNTER — Ambulatory Visit (INDEPENDENT_AMBULATORY_CARE_PROVIDER_SITE_OTHER): Payer: Commercial Managed Care - HMO | Admitting: Internal Medicine

## 2016-02-08 ENCOUNTER — Other Ambulatory Visit (INDEPENDENT_AMBULATORY_CARE_PROVIDER_SITE_OTHER): Payer: Commercial Managed Care - HMO

## 2016-02-08 DIAGNOSIS — I259 Chronic ischemic heart disease, unspecified: Secondary | ICD-10-CM

## 2016-02-08 DIAGNOSIS — E785 Hyperlipidemia, unspecified: Secondary | ICD-10-CM | POA: Diagnosis not present

## 2016-02-08 DIAGNOSIS — E119 Type 2 diabetes mellitus without complications: Secondary | ICD-10-CM

## 2016-02-08 DIAGNOSIS — Z23 Encounter for immunization: Secondary | ICD-10-CM | POA: Diagnosis not present

## 2016-02-08 DIAGNOSIS — Z951 Presence of aortocoronary bypass graft: Secondary | ICD-10-CM | POA: Diagnosis not present

## 2016-02-08 DIAGNOSIS — E1159 Type 2 diabetes mellitus with other circulatory complications: Secondary | ICD-10-CM

## 2016-02-08 LAB — BASIC METABOLIC PANEL
BUN: 16 mg/dL (ref 6–23)
CHLORIDE: 102 meq/L (ref 96–112)
CO2: 29 meq/L (ref 19–32)
Calcium: 9.3 mg/dL (ref 8.4–10.5)
Creatinine, Ser: 1.09 mg/dL (ref 0.40–1.50)
GFR: 85.69 mL/min (ref >60.00)
Glucose, Bld: 136 mg/dL — ABNORMAL HIGH (ref 70–99)
POTASSIUM: 3.9 meq/L (ref 3.5–5.1)
Sodium: 139 mEq/L (ref 135–145)

## 2016-02-08 LAB — HEMOGLOBIN A1C: HEMOGLOBIN A1C: 9.3 % — AB (ref 4.6–6.5)

## 2016-02-08 NOTE — Assessment & Plan Note (Signed)
Metformin, Glimepiride Diovan  and Lovastatin Labs today

## 2016-02-08 NOTE — Progress Notes (Signed)
Subjective:  Patient ID: Johnny [name], male    DOB: 1945-08-11  Age: 70 y.o. MRN: [id number]  CC: No chief complaint on file.   HPI Sears Oran Gura presents for DM, HTN, OA, CAD f/u  Outpatient Medications Prior to Visit  Medication Sig Dispense Refill  . ACCU-CHEK SOFTCLIX LANCETS lancets     . Alcohol Swabs (B-D SINGLE USE SWABS REGULAR) PADS 1 each by Does not apply route 2 (two) times daily as needed. Dx: 250.00 100 each 3  . aspirin 325 MG EC tablet Take 325 mg by mouth every morning.    . Blood Glucose Calibration (ACCU-CHEK AVIVA) SOLN Use as directed as needed. 1 each 2  . Blood Glucose Monitoring Suppl (ACCU-CHEK AVIVA PLUS) W/DEVICE KIT Use twice daily as directed. Dx: 250.00 1 kit 0  . cholecalciferol (VITAMIN D) 1000 UNITS tablet Take 1,000 Units by mouth every morning.    . finasteride (PROSCAR) 5 MG tablet TAKE 1 TABLET EVERY MORNING 90 tablet 3  . fluticasone furoate-vilanterol (BREO ELLIPTA) 100-25 MCG/INH AEPB Inhale 1 puff into the lungs daily. 1 each 5  . glimepiride (AMARYL) 4 MG tablet TAKE 1 TABLET EVERY DAY WITH BREAKFAST 90 tablet 3  . glucose blood (ACCU-CHEK AVIVA) test strip Use twice daily as instructed. Dx: 250.00 100 each 3  . Lancet Devices (ACCU-CHEK SOFTCLIX) lancets Use twice daily as instructed. Dx: 250.00 1 each 3  . lovastatin (MEVACOR) 40 MG tablet TAKE 1 TABLET EVERY MORNING 90 tablet 3  . metFORMIN (GLUCOPHAGE) 1000 MG tablet TAKE 1 TABLET TWICE DAILY WITH MEALS 180 tablet 3  . nitroGLYCERIN (NITROSTAT) 0.4 MG SL tablet Place 1 tablet (0.4 mg total) under the tongue every 5 (five) minutes as needed for chest pain. 15 tablet 3  . pantoprazole (PROTONIX) 40 MG tablet TAKE 1 TABLET EVERY MORNING 90 tablet 3  . tamsulosin (FLOMAX) 0.4 MG CAPS capsule TAKE 1 CAPSULE EVERY DAY 90 capsule 3  . valsartan-hydrochlorothiazide (DIOVAN-HCT) 320-12.5 MG tablet Take 1 tablet by mouth daily. 90 tablet 2  . VENTOLIN HFA 108 (90 Base) MCG/ACT inhaler INHALE 2  PUFFS INTO THE LUNGS EVERY 4 HOURS AS NEEDED FOR WHEEZING OR SHORTNESS OF BREATH 18 g 5  . diphenhydrAMINE (BENADRYL) 25 mg capsule Take 1-2 capsules (25-50 mg total) by mouth every 6 (six) hours as needed for itching. (Patient not taking: Reported on 02/08/2016) 60 capsule 1   No facility-administered medications prior to visit.     ROS Review of Systems  Constitutional: Positive for unexpected weight change. Negative for appetite change and fatigue.  HENT: Negative for congestion, nosebleeds, sneezing, sore throat and trouble swallowing.   Eyes: Negative for itching and visual disturbance.  Respiratory: Negative for cough.   Cardiovascular: Negative for chest pain, palpitations and leg swelling.  Gastrointestinal: Negative for abdominal distention, blood in stool, diarrhea and nausea.  Genitourinary: Negative for frequency and hematuria.  Musculoskeletal: Positive for arthralgias and back pain. Negative for gait problem, joint swelling and neck pain.  Skin: Negative for rash.  Neurological: Negative for dizziness, tremors, speech difficulty and weakness.  Psychiatric/Behavioral: Negative for agitation, dysphoric mood and sleep disturbance. The patient is not nervous/anxious.     Objective:  BP (!) 122/54   Pulse 73   Temp 97.8 F (36.6 C) (Oral)   Wt 253 lb (114.8 kg)   SpO2 98%   BMI 35.29 kg/m   BP Readings from Last 3 Encounters:  02/08/16 (!) 122/54  10/13/15 118/68  07/13/15 [name])  120/58    Wt Readings from Last 3 Encounters:  02/08/16 253 lb (114.8 kg)  10/13/15 249 lb (112.9 kg)  07/13/15 244 lb (110.7 kg)    Physical Exam  Constitutional: He is oriented to person, place, and time. He appears well-developed. No distress.  NAD  HENT:  Mouth/Throat: Oropharynx is clear and moist.  Eyes: Conjunctivae are normal. Pupils are equal, round, and reactive to light.  Neck: Normal range of motion. No JVD present. No thyromegaly present.  Cardiovascular: Normal rate,  regular rhythm, normal heart sounds and intact distal pulses.  Exam reveals no gallop and no friction rub.   No murmur heard. Pulmonary/Chest: Effort normal and breath sounds normal. No respiratory distress. He has no wheezes. He has no rales. He exhibits no tenderness.  Abdominal: Soft. Bowel sounds are normal. He exhibits no distension and no mass. There is no tenderness. There is no rebound and no guarding.  Musculoskeletal: Normal range of motion. He exhibits no edema or tenderness.  Lymphadenopathy:    He has no cervical adenopathy.  Neurological: He is alert and oriented to person, place, and time. He has normal reflexes. No cranial nerve deficit. He exhibits normal muscle tone. He displays a negative Romberg sign. Coordination and gait normal.  Skin: Skin is warm and dry. No rash noted.  Psychiatric: He has a normal mood and affect. His behavior is normal. Judgment and thought content normal.    Lab Results  Component Value Date   WBC 5.8 07/20/2013   HGB 11.4 (L) 07/20/2013   HCT 35.3 (L) 07/20/2013   PLT 258 07/20/2013   GLUCOSE 139 (H) 10/13/2015   CHOL 143 11/29/2013   TRIG 182.0 (H) 11/29/2013   HDL 32.70 (L) 11/29/2013   LDLDIRECT 78.1 09/16/2008   LDLCALC 74 11/29/2013   ALT 13 01/21/2013   AST 13 01/21/2013   NA 138 10/13/2015   K 3.8 10/13/2015   CL 102 10/13/2015   CREATININE 0.96 10/13/2015   BUN 16 10/13/2015   CO2 28 10/13/2015   TSH 3.59 10/14/2010   PSA 0.80 03/20/2015   INR 1.15 01/21/2013   HGBA1C 7.8 (H) 10/13/2015    Dg Chest 2 View  Result Date: 07/14/2015 CLINICAL DATA:  Shortness of breath and wheezing for 2 weeks EXAM: CHEST  2 VIEW COMPARISON:  07/20/2013 FINDINGS: Cardiac shadow is stable. Postsurgical changes are again seen. The lungs are well aerated bilaterally without evidence of focal infiltrate or sizable effusion. Moderate degenerative changes of thoracic spine are noted. IMPRESSION: No active cardiopulmonary disease. Electronically Signed    By: Inez Catalina M.D.   On: 07/14/2015 08:25    Assessment & Plan:   There are no diagnoses linked to this encounter. I am having Mr. Nikolov maintain his nitroGLYCERIN, aspirin, cholecalciferol, ACCU-CHEK AVIVA PLUS, ACCU-CHEK AVIVA, B-D SINGLE USE SWABS REGULAR, glucose blood, accu-chek softclix, ACCU-CHEK SOFTCLIX LANCETS, VENTOLIN HFA, tamsulosin, glimepiride, lovastatin, pantoprazole, finasteride, metFORMIN, diphenhydrAMINE, fluticasone furoate-vilanterol, and valsartan-hydrochlorothiazide.  No orders of the defined types were placed in this encounter.    Follow-up: No Follow-up on file.  Walker Kehr, MD

## 2016-02-08 NOTE — Addendum Note (Signed)
Addended by: Cresenciano Lick on: 02/08/2016 04:29 PM   Modules accepted: Orders

## 2016-02-08 NOTE — Progress Notes (Signed)
Pre visit review using our clinic review tool, if applicable. No additional management support is needed unless otherwise documented below in the visit note. 

## 2016-02-08 NOTE — Telephone Encounter (Signed)
I do see a NO SHOW for November, but do NOT see charges for it. Dustin-copied you just to confirm it will not be charged.

## 2016-02-08 NOTE — Assessment & Plan Note (Signed)
On Lovastatin

## 2016-02-08 NOTE — Assessment & Plan Note (Signed)
Diet discuseed Reduce carbs

## 2016-02-08 NOTE — Assessment & Plan Note (Signed)
ASA, Lovastatin NTG prn

## 2016-02-09 NOTE — Telephone Encounter (Signed)
Confirmed. Will not charge.

## 2016-02-26 ENCOUNTER — Telehealth: Payer: Self-pay | Admitting: *Deleted

## 2016-02-26 NOTE — Telephone Encounter (Signed)
Rec'd fax stating needing clarification on which blood pressure patient is currently taking. We have on file Losartan 100 mg and Valsartan/HCTZ 320/12.5 mg. Ref # [id number]. ID# [id number]. Called [employer] spoke w/Lucas pt is currently taking the valsartan/hctz. D/c losartan on file...Johnny [name]

## 2016-03-31 ENCOUNTER — Encounter: Payer: Self-pay | Admitting: Internal Medicine

## 2016-03-31 ENCOUNTER — Ambulatory Visit (INDEPENDENT_AMBULATORY_CARE_PROVIDER_SITE_OTHER): Payer: Medicare HMO | Admitting: Internal Medicine

## 2016-03-31 DIAGNOSIS — R42 Dizziness and giddiness: Secondary | ICD-10-CM

## 2016-03-31 DIAGNOSIS — R110 Nausea: Secondary | ICD-10-CM | POA: Insufficient documentation

## 2016-03-31 MED ORDER — DIAZEPAM 2 MG PO TABS: 2.0000 mg | ORAL_TABLET | Freq: Four times a day (QID) | ORAL | 0 refills | Status: DC | PRN

## 2016-03-31 MED ORDER — MECLIZINE HCL 12.5 MG PO TABS: 12.5000 mg | ORAL_TABLET | Freq: Three times a day (TID) | ORAL | 1 refills | Status: DC | PRN

## 2016-03-31 MED ORDER — PROMETHAZINE HCL 12.5 MG PO TABS: 12.5000 mg | ORAL_TABLET | Freq: Four times a day (QID) | ORAL | 0 refills | Status: DC | PRN

## 2016-03-31 MED ORDER — ONDANSETRON HCL 4 MG/2ML IJ SOLN
4.0000 mg | Freq: Once | INTRAMUSCULAR | Status: AC
  Administered 2016-03-31: 4 mg via INTRAMUSCULAR

## 2016-03-31 NOTE — Assessment & Plan Note (Signed)
Benign Positional Vertigo symptoms  Start Meclizine, Diazepam, Promethazine. Start Laruth Bouchard - Daroff exercise several times a day as dirrected. No driving if dizzy

## 2016-03-31 NOTE — Progress Notes (Signed)
Subjective:  Patient ID: Johnny [name], male    DOB: Apr 25, 1945  Age: 71 y.o. MRN: [id number]  CC: Dizziness (intermittnent x 1 week)   HPI Johnny [name] presents for dizziness x 1 week off and on CBG 109-244 F/u DM, HTN, CAD  Outpatient Medications Prior to Visit  Medication Sig Dispense Refill  . ACCU-CHEK SOFTCLIX LANCETS lancets     . Alcohol Swabs (B-D SINGLE USE SWABS REGULAR) PADS 1 each by Does not apply route 2 (two) times daily as needed. Dx: 250.00 100 each 3  . aspirin 325 MG EC tablet Take 325 mg by mouth every morning.    . Blood Glucose Calibration (ACCU-CHEK AVIVA) SOLN Use as directed as needed. 1 each 2  . Blood Glucose Monitoring Suppl (ACCU-CHEK AVIVA PLUS) W/DEVICE KIT Use twice daily as directed. Dx: 250.00 1 kit 0  . cholecalciferol (VITAMIN D) 1000 UNITS tablet Take 1,000 Units by mouth every morning.    . diphenhydrAMINE (BENADRYL) 25 mg capsule Take 1-2 capsules (25-50 mg total) by mouth every 6 (six) hours as needed for itching. 60 capsule 1  . finasteride (PROSCAR) 5 MG tablet TAKE 1 TABLET EVERY MORNING 90 tablet 3  . fluticasone furoate-vilanterol (BREO ELLIPTA) 100-25 MCG/INH AEPB Inhale 1 puff into the lungs daily. 1 each 5  . glimepiride (AMARYL) 4 MG tablet TAKE 1 TABLET EVERY DAY WITH BREAKFAST 90 tablet 3  . glucose blood (ACCU-CHEK AVIVA) test strip Use twice daily as instructed. Dx: 250.00 100 each 3  . Lancet Devices (ACCU-CHEK SOFTCLIX) lancets Use twice daily as instructed. Dx: 250.00 1 each 3  . lovastatin (MEVACOR) 40 MG tablet TAKE 1 TABLET EVERY MORNING 90 tablet 3  . metFORMIN (GLUCOPHAGE) 1000 MG tablet TAKE 1 TABLET TWICE DAILY WITH MEALS 180 tablet 3  . nitroGLYCERIN (NITROSTAT) 0.4 MG SL tablet Place 1 tablet (0.4 mg total) under the tongue every 5 (five) minutes as needed for chest pain. 15 tablet 3  . pantoprazole (PROTONIX) 40 MG tablet TAKE 1 TABLET EVERY MORNING 90 tablet 3  . tamsulosin (FLOMAX) 0.4 MG CAPS capsule TAKE 1  CAPSULE EVERY DAY 90 capsule 3  . valsartan-hydrochlorothiazide (DIOVAN-HCT) 320-12.5 MG tablet Take 1 tablet by mouth daily. 90 tablet 2  . VENTOLIN HFA 108 (90 Base) MCG/ACT inhaler INHALE 2 PUFFS INTO THE LUNGS EVERY 4 HOURS AS NEEDED FOR WHEEZING OR SHORTNESS OF BREATH 18 g 5   No facility-administered medications prior to visit.     ROS Review of Systems  Constitutional: Negative for appetite change, fatigue and unexpected weight change.  HENT: Negative for congestion, nosebleeds, sneezing, sore throat and trouble swallowing.   Eyes: Negative for itching and visual disturbance.  Respiratory: Negative for cough.   Cardiovascular: Negative for chest pain, palpitations and leg swelling.  Gastrointestinal: Negative for abdominal distention, blood in stool, diarrhea and nausea.  Genitourinary: Negative for frequency and hematuria.  Musculoskeletal: Negative for back pain, gait problem, joint swelling and neck pain.  Skin: Negative for rash.  Neurological: Positive for dizziness. Negative for tremors, seizures, speech difficulty, weakness and headaches.  Psychiatric/Behavioral: Negative for agitation, dysphoric mood and sleep disturbance. The patient is not nervous/anxious.     Objective:  BP 140/64   Pulse 68   Wt 247 lb (112 kg)   SpO2 97%   BMI 34.45 kg/m   BP Readings from Last 3 Encounters:  03/31/16 140/64  02/08/16 (!) 122/54  10/13/15 118/68    Wt Readings from Last 3  Encounters:  03/31/16 247 lb (112 kg)  02/08/16 253 lb (114.8 kg)  10/13/15 249 lb (112.9 kg)    Physical Exam  Constitutional: He is oriented to person, place, and time. He appears well-developed. No distress.  NAD  HENT:  Mouth/Throat: Oropharynx is clear and moist.  Eyes: Conjunctivae are normal. Pupils are equal, round, and reactive to light.  Neck: Normal range of motion. No JVD present. No thyromegaly present.  Cardiovascular: Normal rate, regular rhythm, normal heart sounds and intact  distal pulses.  Exam reveals no gallop and no friction rub.   No murmur heard. Pulmonary/Chest: Effort normal and breath sounds normal. No respiratory distress. He has no wheezes. He has no rales. He exhibits no tenderness.  Abdominal: Soft. Bowel sounds are normal. He exhibits no distension and no mass. There is no tenderness. There is no rebound and no guarding.  Musculoskeletal: Normal range of motion. He exhibits no edema or tenderness.  Lymphadenopathy:    He has no cervical adenopathy.  Neurological: He is alert and oriented to person, place, and time. He has normal reflexes. No cranial nerve deficit. He exhibits normal muscle tone. He displays a negative Romberg sign. Coordination and gait normal.  Skin: Skin is warm and dry. No rash noted.  Psychiatric: He has a normal mood and affect. His behavior is normal. Judgment and thought content normal.  Obese  Lab Results  Component Value Date   WBC 5.8 07/20/2013   HGB 11.4 (L) 07/20/2013   HCT 35.3 (L) 07/20/2013   PLT 258 07/20/2013   GLUCOSE 136 (H) 02/08/2016   CHOL 143 11/29/2013   TRIG 182.0 (H) 11/29/2013   HDL 32.70 (L) 11/29/2013   LDLDIRECT 78.1 09/16/2008   LDLCALC 74 11/29/2013   ALT 13 01/21/2013   AST 13 01/21/2013   NA 139 02/08/2016   K 3.9 02/08/2016   CL 102 02/08/2016   CREATININE 1.09 02/08/2016   BUN 16 02/08/2016   CO2 29 02/08/2016   TSH 3.59 10/14/2010   PSA 0.80 03/20/2015   INR 1.15 01/21/2013   HGBA1C 9.3 (H) 02/08/2016    Dg Chest 2 View  Result Date: 07/14/2015 CLINICAL DATA:  Shortness of breath and wheezing for 2 weeks EXAM: CHEST  2 VIEW COMPARISON:  07/20/2013 FINDINGS: Cardiac shadow is stable. Postsurgical changes are again seen. The lungs are well aerated bilaterally without evidence of focal infiltrate or sizable effusion. Moderate degenerative changes of thoracic spine are noted. IMPRESSION: No active cardiopulmonary disease. Electronically Signed   By: Inez Catalina M.D.   On: 07/14/2015  08:25    Assessment & Plan:   There are no diagnoses linked to this encounter. I am having Mr. Auxier maintain his nitroGLYCERIN, aspirin, cholecalciferol, ACCU-CHEK AVIVA PLUS, ACCU-CHEK AVIVA, B-D SINGLE USE SWABS REGULAR, glucose blood, accu-chek softclix, ACCU-CHEK SOFTCLIX LANCETS, VENTOLIN HFA, tamsulosin, glimepiride, lovastatin, pantoprazole, finasteride, metFORMIN, diphenhydrAMINE, fluticasone furoate-vilanterol, and valsartan-hydrochlorothiazide.  No orders of the defined types were placed in this encounter.    Follow-up: No Follow-up on file.  Walker Kehr, MD

## 2016-03-31 NOTE — Progress Notes (Signed)
Pre visit review using our clinic review tool, if applicable. No additional management support is needed unless otherwise documented below in the visit note. 

## 2016-03-31 NOTE — Patient Instructions (Signed)
Benign Positional Vertigo symptoms  Start Johnny [name] - Daroff exercise several times a day as directed  If tolerated.

## 2016-03-31 NOTE — Assessment & Plan Note (Signed)
Zofran IM now  Promethazine prn

## 2016-04-19 ENCOUNTER — Telehealth: Payer: Self-pay

## 2016-04-19 NOTE — Telephone Encounter (Signed)
PA for promethazine was done covermymeds  Key [id number]

## 2016-04-22 ENCOUNTER — Telehealth: Payer: Self-pay

## 2016-04-22 NOTE — Telephone Encounter (Signed)
Corrine to call and handle

## 2016-04-22 NOTE — Telephone Encounter (Signed)
Called and spoke to [place] from [place]. Gave information for patients PA for the promethazine 12.5 mg tablet. She said the PA is going for review and we would get a fax with the outcome.

## 2016-04-22 NOTE — Telephone Encounter (Signed)
Called and left message for patient stating that PA was being reviewed from insurance and will know answer within 24 hours

## 2016-04-22 NOTE — Telephone Encounter (Signed)
Pt called back stating [employer] never got any from our office concern PA. Please check and resend today, he needs his medication. Please help

## 2016-04-28 ENCOUNTER — Telehealth: Payer: Self-pay | Admitting: Internal Medicine

## 2016-04-28 NOTE — Telephone Encounter (Signed)
PA- started on via cover my Med's Key: [id number] Case Id: [id number]

## 2016-04-29 NOTE — Telephone Encounter (Signed)
PA has been denied.  Pt informed of denial.  Pt paid out of pocket.

## 2016-05-27 ENCOUNTER — Other Ambulatory Visit: Payer: Self-pay | Admitting: Internal Medicine

## 2016-06-10 ENCOUNTER — Ambulatory Visit (INDEPENDENT_AMBULATORY_CARE_PROVIDER_SITE_OTHER): Payer: Medicare HMO | Admitting: Internal Medicine

## 2016-06-10 ENCOUNTER — Encounter: Payer: Self-pay | Admitting: Internal Medicine

## 2016-06-10 DIAGNOSIS — I10 Essential (primary) hypertension: Secondary | ICD-10-CM | POA: Diagnosis not present

## 2016-06-10 DIAGNOSIS — R42 Dizziness and giddiness: Secondary | ICD-10-CM

## 2016-06-10 DIAGNOSIS — E1159 Type 2 diabetes mellitus with other circulatory complications: Secondary | ICD-10-CM | POA: Diagnosis not present

## 2016-06-10 MED ORDER — TAMSULOSIN HCL 0.4 MG PO CAPS: 0.4000 mg | ORAL_CAPSULE | Freq: Every day | ORAL | 3 refills | Status: DC

## 2016-06-10 NOTE — Progress Notes (Signed)
Pre visit review using our clinic review tool, if applicable. No additional management support is needed unless otherwise documented below in the visit note. 

## 2016-06-10 NOTE — Assessment & Plan Note (Signed)
Metformin, Glimepiride Diovan  and Lovastatin

## 2016-06-10 NOTE — Assessment & Plan Note (Signed)
Diovan-HCT 

## 2016-06-10 NOTE — Progress Notes (Signed)
Subjective:  Patient ID: Johnny [name], male    DOB: [birth year]  Age: 71 y.o. MRN: [id number]  CC: No chief complaint on file.   HPI BUSH MURDOCH presents for DM, HTN, dizziness f/u  Outpatient Medications Prior to Visit  Medication Sig Dispense Refill  . ACCU-CHEK SOFTCLIX LANCETS lancets     . Alcohol Swabs (B-D SINGLE USE SWABS REGULAR) PADS 1 each by Does not apply route 2 (two) times daily as needed. Dx: 250.00 100 each 3  . aspirin 325 MG EC tablet Take 325 mg by mouth every morning.    . Blood Glucose Calibration (ACCU-CHEK AVIVA) SOLN Use as directed as needed. 1 each 2  . Blood Glucose Monitoring Suppl (ACCU-CHEK AVIVA PLUS) W/DEVICE KIT Use twice daily as directed. Dx: 250.00 1 kit 0  . cholecalciferol (VITAMIN D) 1000 UNITS tablet Take 1,000 Units by mouth every morning.    . diazepam (VALIUM) 2 MG tablet Take 1-2 tablets (2-4 mg total) by mouth every 6 (six) hours as needed. Prn vertigo 60 tablet 0  . finasteride (PROSCAR) 5 MG tablet TAKE 1 TABLET EVERY MORNING 90 tablet 2  . fluticasone furoate-vilanterol (BREO ELLIPTA) 100-25 MCG/INH AEPB Inhale 1 puff into the lungs daily. 1 each 5  . glimepiride (AMARYL) 4 MG tablet TAKE 1 TABLET EVERY DAY WITH BREAKFAST 90 tablet 2  . glucose blood (ACCU-CHEK AVIVA) test strip Use twice daily as instructed. Dx: 250.00 100 each 3  . Lancet Devices (ACCU-CHEK SOFTCLIX) lancets Use twice daily as instructed. Dx: 250.00 1 each 3  . lovastatin (MEVACOR) 40 MG tablet TAKE 1 TABLET EVERY MORNING 90 tablet 2  . meclizine (ANTIVERT) 12.5 MG tablet Take 1 tablet (12.5 mg total) by mouth 3 (three) times daily as needed for dizziness. 60 tablet 1  . metFORMIN (GLUCOPHAGE) 1000 MG tablet TAKE 1 TABLET TWICE DAILY WITH MEALS 180 tablet 2  . nitroGLYCERIN (NITROSTAT) 0.4 MG SL tablet Place 1 tablet (0.4 mg total) under the tongue every 5 (five) minutes as needed for chest pain. 15 tablet 3  . pantoprazole (PROTONIX) 40 MG tablet TAKE 1 TABLET  EVERY MORNING 90 tablet 2  . tamsulosin (FLOMAX) 0.4 MG CAPS capsule TAKE 1 CAPSULE EVERY DAY 90 capsule 3  . valsartan-hydrochlorothiazide (DIOVAN-HCT) 320-12.5 MG tablet Take 1 tablet by mouth daily. 90 tablet 2  . VENTOLIN HFA 108 (90 Base) MCG/ACT inhaler INHALE 2 PUFFS INTO THE LUNGS EVERY 4 HOURS AS NEEDED FOR WHEEZING OR SHORTNESS OF BREATH 18 g 5  . promethazine (PHENERGAN) 12.5 MG tablet Take 1-2 tablets (12.5-25 mg total) by mouth every 6 (six) hours as needed for nausea or vomiting. 60 tablet 0   No facility-administered medications prior to visit.     ROS Review of Systems  Constitutional: Negative for appetite change, fatigue and unexpected weight change.  HENT: Negative for congestion, nosebleeds, sneezing, sore throat and trouble swallowing.   Eyes: Negative for itching and visual disturbance.  Respiratory: Negative for cough.   Cardiovascular: Negative for chest pain, palpitations and leg swelling.  Gastrointestinal: Negative for abdominal distention, blood in stool, diarrhea and nausea.  Genitourinary: Negative for frequency and hematuria.  Musculoskeletal: Positive for arthralgias. Negative for back pain, gait problem, joint swelling and neck pain.  Skin: Negative for rash.  Neurological: Positive for dizziness. Negative for tremors, speech difficulty and weakness.  Psychiatric/Behavioral: Negative for agitation, dysphoric mood, sleep disturbance and suicidal ideas. The patient is not nervous/anxious.     Objective:  BP (!) 134/58   Pulse 72   Temp 98.2 F (36.8 C)   Ht '5\' 11"'$  (1.803 m)   Wt 250 lb (113.4 kg)   SpO2 97%   BMI 34.87 kg/m   BP Readings from Last 3 Encounters:  06/10/16 (!) 134/58  03/31/16 140/64  02/08/16 (!) 122/54    Wt Readings from Last 3 Encounters:  06/10/16 250 lb (113.4 kg)  03/31/16 247 lb (112 kg)  02/08/16 253 lb (114.8 kg)    Physical Exam  Constitutional: He is oriented to person, place, and time. He appears  well-developed. No distress.  NAD  HENT:  Mouth/Throat: Oropharynx is clear and moist.  Eyes: Conjunctivae are normal. Pupils are equal, round, and reactive to light.  Neck: Normal range of motion. No JVD present. No thyromegaly present.  Cardiovascular: Normal rate, regular rhythm, normal heart sounds and intact distal pulses.  Exam reveals no gallop and no friction rub.   No murmur heard. Pulmonary/Chest: Effort normal and breath sounds normal. No respiratory distress. He has no wheezes. He has no rales. He exhibits no tenderness.  Abdominal: Soft. Bowel sounds are normal. He exhibits no distension and no mass. There is no tenderness. There is no rebound and no guarding.  Musculoskeletal: Normal range of motion. He exhibits no edema or tenderness.  Lymphadenopathy:    He has no cervical adenopathy.  Neurological: He is alert and oriented to person, place, and time. He has normal reflexes. No cranial nerve deficit. He exhibits normal muscle tone. He displays a negative Romberg sign. Coordination abnormal. Gait normal.  Skin: Skin is warm and dry. No rash noted.  Psychiatric: He has a normal mood and affect. His behavior is normal. Judgment and thought content normal.  B TMs w/scars  Lab Results  Component Value Date   WBC 5.8 07/20/2013   HGB 11.4 (L) 07/20/2013   HCT 35.3 (L) 07/20/2013   PLT 258 07/20/2013   GLUCOSE 136 (H) 02/08/2016   CHOL 143 11/29/2013   TRIG 182.0 (H) 11/29/2013   HDL 32.70 (L) 11/29/2013   LDLDIRECT 78.1 09/16/2008   LDLCALC 74 11/29/2013   ALT 13 01/21/2013   AST 13 01/21/2013   NA 139 02/08/2016   K 3.9 02/08/2016   CL 102 02/08/2016   CREATININE 1.09 02/08/2016   BUN 16 02/08/2016   CO2 29 02/08/2016   TSH 3.59 10/14/2010   PSA 0.80 03/20/2015   INR 1.15 01/21/2013   HGBA1C 9.3 (H) 02/08/2016    Dg Chest 2 View  Result Date: 07/14/2015 CLINICAL DATA:  Shortness of breath and wheezing for 2 weeks EXAM: CHEST  2 VIEW COMPARISON:  07/20/2013  FINDINGS: Cardiac shadow is stable. Postsurgical changes are again seen. The lungs are well aerated bilaterally without evidence of focal infiltrate or sizable effusion. Moderate degenerative changes of thoracic spine are noted. IMPRESSION: No active cardiopulmonary disease. Electronically Signed   By: Inez Catalina M.D.   On: 07/14/2015 08:25    Assessment & Plan:   There are no diagnoses linked to this encounter. I am having Mr. Sigmon maintain his nitroGLYCERIN, aspirin, cholecalciferol, ACCU-CHEK AVIVA PLUS, ACCU-CHEK AVIVA, B-D SINGLE USE SWABS REGULAR, glucose blood, accu-chek softclix, ACCU-CHEK SOFTCLIX LANCETS, VENTOLIN HFA, tamsulosin, fluticasone furoate-vilanterol, valsartan-hydrochlorothiazide, promethazine, diazepam, meclizine, glimepiride, lovastatin, pantoprazole, finasteride, metFORMIN, and Multiple Vitamins-Minerals (MULTI-VITAMIN GUMMIES PO).  Meds ordered this encounter  Medications  . Multiple Vitamins-Minerals (MULTI-VITAMIN GUMMIES PO)     Follow-up: No Follow-up on file.  Walker Kehr, MD

## 2016-06-10 NOTE — Assessment & Plan Note (Signed)
Not better ENT ref 

## 2016-06-15 ENCOUNTER — Ambulatory Visit (INDEPENDENT_AMBULATORY_CARE_PROVIDER_SITE_OTHER): Payer: Medicare HMO | Admitting: Internal Medicine

## 2016-06-15 ENCOUNTER — Encounter: Payer: Self-pay | Admitting: Internal Medicine

## 2016-06-15 DIAGNOSIS — M10079 Idiopathic gout, unspecified ankle and foot: Secondary | ICD-10-CM

## 2016-06-15 DIAGNOSIS — I10 Essential (primary) hypertension: Secondary | ICD-10-CM | POA: Diagnosis not present

## 2016-06-15 DIAGNOSIS — E1159 Type 2 diabetes mellitus with other circulatory complications: Secondary | ICD-10-CM | POA: Diagnosis not present

## 2016-06-15 DIAGNOSIS — M109 Gout, unspecified: Secondary | ICD-10-CM | POA: Insufficient documentation

## 2016-06-15 MED ORDER — COLCHICINE 0.6 MG PO TABS: 0.6000 mg | ORAL_TABLET | Freq: Four times a day (QID) | ORAL | 1 refills | Status: DC | PRN

## 2016-06-15 MED ORDER — DICLOFENAC SODIUM 75 MG PO TBEC: 75.0000 mg | DELAYED_RELEASE_TABLET | Freq: Two times a day (BID) | ORAL | 2 refills | Status: DC | PRN

## 2016-06-15 MED ORDER — KETOROLAC TROMETHAMINE 60 MG/2ML IM SOLN
60.0000 mg | Freq: Once | INTRAMUSCULAR | Status: AC
  Administered 2016-06-15: 60 mg via INTRAMUSCULAR

## 2016-06-15 NOTE — Patient Instructions (Signed)

## 2016-06-15 NOTE — Progress Notes (Signed)
Subjective:  Patient ID: Johnny [name], male    DOB: 1945/10/21  Age: 71 y.o. MRN: [id number]  CC: No chief complaint on file.   HPI RIGGINS CISEK presents for B heel/ankle pain and ankle swelling L>R starting Sunday. He took Advil - better F/u DM, HTN  Outpatient Medications Prior to Visit  Medication Sig Dispense Refill  . ACCU-CHEK SOFTCLIX LANCETS lancets     . Alcohol Swabs (B-D SINGLE USE SWABS REGULAR) PADS 1 each by Does not apply route 2 (two) times daily as needed. Dx: 250.00 100 each 3  . aspirin 325 MG EC tablet Take 325 mg by mouth every morning.    . Blood Glucose Calibration (ACCU-CHEK AVIVA) SOLN Use as directed as needed. 1 each 2  . Blood Glucose Monitoring Suppl (ACCU-CHEK AVIVA PLUS) W/DEVICE KIT Use twice daily as directed. Dx: 250.00 1 kit 0  . cholecalciferol (VITAMIN D) 1000 UNITS tablet Take 1,000 Units by mouth every morning.    . diazepam (VALIUM) 2 MG tablet Take 1-2 tablets (2-4 mg total) by mouth every 6 (six) hours as needed. Prn vertigo 60 tablet 0  . finasteride (PROSCAR) 5 MG tablet TAKE 1 TABLET EVERY MORNING 90 tablet 2  . fluticasone furoate-vilanterol (BREO ELLIPTA) 100-25 MCG/INH AEPB Inhale 1 puff into the lungs daily. 1 each 5  . glimepiride (AMARYL) 4 MG tablet TAKE 1 TABLET EVERY DAY WITH BREAKFAST 90 tablet 2  . glucose blood (ACCU-CHEK AVIVA) test strip Use twice daily as instructed. Dx: 250.00 100 each 3  . Lancet Devices (ACCU-CHEK SOFTCLIX) lancets Use twice daily as instructed. Dx: 250.00 1 each 3  . lovastatin (MEVACOR) 40 MG tablet TAKE 1 TABLET EVERY MORNING 90 tablet 2  . meclizine (ANTIVERT) 12.5 MG tablet Take 1 tablet (12.5 mg total) by mouth 3 (three) times daily as needed for dizziness. 60 tablet 1  . metFORMIN (GLUCOPHAGE) 1000 MG tablet TAKE 1 TABLET TWICE DAILY WITH MEALS 180 tablet 2  . Multiple Vitamins-Minerals (MULTI-VITAMIN GUMMIES PO)     . nitroGLYCERIN (NITROSTAT) 0.4 MG SL tablet Place 1 tablet (0.4 mg total)  under the tongue every 5 (five) minutes as needed for chest pain. 15 tablet 3  . pantoprazole (PROTONIX) 40 MG tablet TAKE 1 TABLET EVERY MORNING 90 tablet 2  . tamsulosin (FLOMAX) 0.4 MG CAPS capsule Take 1 capsule (0.4 mg total) by mouth daily. 90 capsule 3  . valsartan-hydrochlorothiazide (DIOVAN-HCT) 320-12.5 MG tablet Take 1 tablet by mouth daily. 90 tablet 2  . VENTOLIN HFA 108 (90 Base) MCG/ACT inhaler INHALE 2 PUFFS INTO THE LUNGS EVERY 4 HOURS AS NEEDED FOR WHEEZING OR SHORTNESS OF BREATH 18 g 5  . promethazine (PHENERGAN) 12.5 MG tablet Take 1-2 tablets (12.5-25 mg total) by mouth every 6 (six) hours as needed for nausea or vomiting. 60 tablet 0   No facility-administered medications prior to visit.     ROS Review of Systems  Objective:  BP 140/60 (BP Location: Left Arm, Patient Position: Sitting, Cuff Size: Large)   Pulse 61   Temp 97.5 F (36.4 C) (Oral)   Ht '5\' 11"'$  (1.803 m)   Wt 249 lb 0.6 oz (113 kg)   SpO2 99%   BMI 34.73 kg/m   BP Readings from Last 3 Encounters:  06/15/16 140/60  06/10/16 (!) 134/58  03/31/16 140/64    Wt Readings from Last 3 Encounters:  06/15/16 249 lb 0.6 oz (113 kg)  06/10/16 250 lb (113.4 kg)  03/31/16 247  lb (112 kg)    Physical Exam  Lab Results  Component Value Date   WBC 5.8 07/20/2013   HGB 11.4 (L) 07/20/2013   HCT 35.3 (L) 07/20/2013   PLT 258 07/20/2013   GLUCOSE 136 (H) 02/08/2016   CHOL 143 11/29/2013   TRIG 182.0 (H) 11/29/2013   HDL 32.70 (L) 11/29/2013   LDLDIRECT 78.1 09/16/2008   LDLCALC 74 11/29/2013   ALT 13 01/21/2013   AST 13 01/21/2013   NA 139 02/08/2016   K 3.9 02/08/2016   CL 102 02/08/2016   CREATININE 1.09 02/08/2016   BUN 16 02/08/2016   CO2 29 02/08/2016   TSH 3.59 10/14/2010   PSA 0.80 03/20/2015   INR 1.15 01/21/2013   HGBA1C 9.3 (H) 02/08/2016    Dg Chest 2 View  Result Date: 07/14/2015 CLINICAL DATA:  Shortness of breath and wheezing for 2 weeks EXAM: CHEST  2 VIEW COMPARISON:   07/20/2013 FINDINGS: Cardiac shadow is stable. Postsurgical changes are again seen. The lungs are well aerated bilaterally without evidence of focal infiltrate or sizable effusion. Moderate degenerative changes of thoracic spine are noted. IMPRESSION: No active cardiopulmonary disease. Electronically Signed   By: Inez Catalina M.D.   On: 07/14/2015 08:25    Assessment & Plan:   There are no diagnoses linked to this encounter. I am having Mr. Winger maintain his nitroGLYCERIN, aspirin, cholecalciferol, ACCU-CHEK AVIVA PLUS, ACCU-CHEK AVIVA, B-D SINGLE USE SWABS REGULAR, glucose blood, accu-chek softclix, ACCU-CHEK SOFTCLIX LANCETS, VENTOLIN HFA, fluticasone furoate-vilanterol, valsartan-hydrochlorothiazide, promethazine, diazepam, meclizine, glimepiride, lovastatin, pantoprazole, finasteride, metFORMIN, Multiple Vitamins-Minerals (MULTI-VITAMIN GUMMIES PO), and tamsulosin.  No orders of the defined types were placed in this encounter.    Follow-up: No Follow-up on file.  Walker Kehr, MD

## 2016-06-15 NOTE — Assessment & Plan Note (Signed)
Wt Readings from Last 3 Encounters:  06/15/16 249 lb 0.6 oz (113 kg)  06/10/16 250 lb (113.4 kg)  03/31/16 247 lb (112 kg)

## 2016-06-15 NOTE — Assessment & Plan Note (Signed)
Cut back on salt

## 2016-06-15 NOTE — Progress Notes (Signed)
Pre visit review using our clinic review tool, if applicable. No additional management support is needed unless otherwise documented below in the visit note. 

## 2016-06-15 NOTE — Assessment & Plan Note (Addendum)
B feet Diclofenac and colchicine prn Discussed prophylactic Rx if warranted by attack frequency

## 2016-06-15 NOTE — Assessment & Plan Note (Signed)
Will avoid steroids for gout

## 2016-06-23 ENCOUNTER — Telehealth: Payer: Self-pay | Admitting: Internal Medicine

## 2016-07-11 DIAGNOSIS — R42 Dizziness and giddiness: Secondary | ICD-10-CM | POA: Diagnosis not present

## 2016-07-11 DIAGNOSIS — H8110 Benign paroxysmal vertigo, unspecified ear: Secondary | ICD-10-CM | POA: Insufficient documentation

## 2016-07-11 DIAGNOSIS — H9193 Unspecified hearing loss, bilateral: Secondary | ICD-10-CM | POA: Insufficient documentation

## 2016-07-19 ENCOUNTER — Ambulatory Visit: Payer: Medicare HMO | Admitting: Endocrinology

## 2016-07-25 DIAGNOSIS — H903 Sensorineural hearing loss, bilateral: Secondary | ICD-10-CM | POA: Diagnosis not present

## 2016-08-12 ENCOUNTER — Ambulatory Visit: Payer: Medicare HMO | Admitting: Endocrinology

## 2016-09-01 ENCOUNTER — Ambulatory Visit (INDEPENDENT_AMBULATORY_CARE_PROVIDER_SITE_OTHER): Payer: Medicare HMO | Admitting: Internal Medicine

## 2016-09-01 ENCOUNTER — Encounter: Payer: Self-pay | Admitting: Internal Medicine

## 2016-09-01 VITALS — BP 142/68 | HR 70 | Ht 71.0 in | Wt 246.0 lb

## 2016-09-01 DIAGNOSIS — Z951 Presence of aortocoronary bypass graft: Secondary | ICD-10-CM | POA: Diagnosis not present

## 2016-09-01 DIAGNOSIS — R079 Chest pain, unspecified: Secondary | ICD-10-CM | POA: Diagnosis not present

## 2016-09-01 DIAGNOSIS — I5022 Chronic systolic (congestive) heart failure: Secondary | ICD-10-CM | POA: Diagnosis not present

## 2016-09-01 MED ORDER — NITROGLYCERIN 0.4 MG SL SUBL: 0.4000 mg | SUBLINGUAL_TABLET | SUBLINGUAL | 3 refills | Status: DC | PRN

## 2016-09-01 NOTE — Progress Notes (Signed)
HPI Johnny [name] returns today for evaluation of chest pain. He is a very pleasant 71 year old man with a history of hypertension, coronary disease, dyslipidemia, and diabetes. When I saw the patient over a year ago, he was having trouble with chest pain but had a negative stress test. He has not been able to lose weight but has not gained any either. He has increased his activity.  Allergies  Allergen Reactions  . Enalapril     SOB  . Hydrocodone-Acetaminophen Nausea Only    Can take codein     Current Outpatient Prescriptions  Medication Sig Dispense Refill  . ACCU-CHEK SOFTCLIX LANCETS lancets     . Alcohol Swabs (B-D SINGLE USE SWABS REGULAR) PADS 1 each by Does not apply route 2 (two) times daily as needed. Dx: 250.00 100 each 3  . aspirin 325 MG EC tablet Take 325 mg by mouth every morning.    . Blood Glucose Calibration (ACCU-CHEK AVIVA) SOLN Use as directed as needed. 1 each 2  . Blood Glucose Monitoring Suppl (ACCU-CHEK AVIVA PLUS) W/DEVICE KIT Use twice daily as directed. Dx: 250.00 1 kit 0  . cholecalciferol (VITAMIN D) 1000 UNITS tablet Take 1,000 Units by mouth every morning.    . colchicine 0.6 MG tablet Take 1 tablet (0.6 mg total) by mouth 4 (four) times daily as needed (gout attack). 60 tablet 1  . diazepam (VALIUM) 2 MG tablet Take 1-2 tablets (2-4 mg total) by mouth every 6 (six) hours as needed. Prn vertigo 60 tablet 0  . diclofenac (VOLTAREN) 75 MG EC tablet Take 1 tablet (75 mg total) by mouth 2 (two) times daily as needed for moderate pain (gout attack). 30 tablet 2  . finasteride (PROSCAR) 5 MG tablet TAKE 1 TABLET EVERY MORNING 90 tablet 2  . fluticasone furoate-vilanterol (BREO ELLIPTA) 100-25 MCG/INH AEPB Inhale 1 puff into the lungs daily. 1 each 5  . glimepiride (AMARYL) 4 MG tablet TAKE 1 TABLET EVERY DAY WITH BREAKFAST 90 tablet 2  . glucose blood (ACCU-CHEK AVIVA) test strip Use twice daily as instructed. Dx: 250.00 100 each 3  . Lancet Devices (ACCU-CHEK  SOFTCLIX) lancets Use twice daily as instructed. Dx: 250.00 1 each 3  . lovastatin (MEVACOR) 40 MG tablet TAKE 1 TABLET EVERY MORNING 90 tablet 2  . meclizine (ANTIVERT) 12.5 MG tablet Take 1 tablet (12.5 mg total) by mouth 3 (three) times daily as needed for dizziness. 60 tablet 1  . metFORMIN (GLUCOPHAGE) 1000 MG tablet TAKE 1 TABLET TWICE DAILY WITH MEALS 180 tablet 2  . Multiple Vitamins-Minerals (MULTI-VITAMIN GUMMIES PO)     . nitroGLYCERIN (NITROSTAT) 0.4 MG SL tablet Place 1 tablet (0.4 mg total) under the tongue every 5 (five) minutes as needed for chest pain. 15 tablet 3  . pantoprazole (PROTONIX) 40 MG tablet TAKE 1 TABLET EVERY MORNING 90 tablet 2  . tamsulosin (FLOMAX) 0.4 MG CAPS capsule Take 1 capsule (0.4 mg total) by mouth daily. 90 capsule 3  . valsartan-hydrochlorothiazide (DIOVAN-HCT) 320-12.5 MG tablet Take 1 tablet by mouth daily. 90 tablet 2  . VENTOLIN HFA 108 (90 Base) MCG/ACT inhaler INHALE 2 PUFFS INTO THE LUNGS EVERY 4 HOURS AS NEEDED FOR WHEEZING OR SHORTNESS OF BREATH 18 g 5  . promethazine (PHENERGAN) 12.5 MG tablet Take 1-2 tablets (12.5-25 mg total) by mouth every 6 (six) hours as needed for nausea or vomiting. 60 tablet 0   No current facility-administered medications for this visit.      Past Medical  History:  Diagnosis Date  . Arthritis   . At risk for sleep apnea    STOP-BANG=  6   SENT TO PCP 05-21-2013  . Benign positional vertigo   . Benign prostatic hypertrophy   . Blood transfusion without reported diagnosis 2000  . CAD (coronary artery disease)    CARDIOLOGIST--  DR Lewayne Bunting  . CHF (congestive heart failure) (HCC)   . Chronic ischemic heart disease, unspecified   . ED (erectile dysfunction) of organic origin   . GERD (gastroesophageal reflux disease)   . History of CHF (congestive heart failure)    systolic  . HTN (hypertension)   . Hyperlipidemia   . Type 2 diabetes mellitus (HCC)   . Wears glasses     ROS:   All systems reviewed  and negative except as noted in the HPI.   Past Surgical History:  Procedure Laterality Date  . CARDIAC CATHETERIZATION  03-03-2000  dr gregg taylor   normal lvsf/  two-vessel cad significant complex stenosis at distal left main  . CARDIOVASCULAR STRESS TEST  08-09-2010  DR Sharlot Gowda TAYLOR   normal lexiscan nuclear study/  no ischemia/  normal lvf/  ef 54%  . CATARACT EXTRACTION W/ INTRAOCULAR LENS IMPLANT Right 2011  . COLONOSCOPY  12-30-2009  . CORONARY ARTERY BYPASS GRAFT  03-06-2000   DR [id number]   third vessel  . PENILE PROSTHESIS IMPLANT  02-08-2010   COLOPLAST 3-PIECE INFLATABLE  . PENILE PROSTHESIS IMPLANT N/A 05/27/2013   Procedure: CYSTO REMOVAL OF PENILE PROSTHESIS;  Surgeon: Garnett Farm, MD;  Location: [facility];  Service: Urology;  Laterality: N/A;  . POLYPECTOMY  12-30-2009   +TA  . TOTAL HIP ARTHROPLASTY  01/24/2011   Procedure: TOTAL HIP ARTHROPLASTY;  Surgeon: Gus Rankin [name];  Location: [facility];  Service: Orthopedics;  Laterality: Left;  . TOTAL HIP ARTHROPLASTY Right 01/30/2013   Procedure: RIGHT TOTAL HIP ARTHROPLASTY;  Surgeon: Loanne Drilling, MD;  Location: [facility];  Service: Orthopedics;  Laterality: Right;     Family History  Problem Relation Age of Onset  . Hypertension Mother   . Diabetes Father   . Cancer - Other Brother        cancer all over  . Coronary artery disease Other        1st degree male relative  . Hypertension Other   . Cancer Sister 73       ovarian ca  . Colon cancer Neg Hx      Social History   Social History  . Marital status: Married    Spouse name: N/A  . Number of children: N/A  . Years of education: N/A   Occupational History  . Preacher Retired   Social History Main Topics  . Smoking status: Former Smoker    Types: Cigarettes    Quit date: 05/22/1970  . Smokeless tobacco: Never Used  . Alcohol use No  . Drug use: No  . Sexual activity: Not on file   Other Topics Concern  . Not on file    Social History Narrative   Regular Exercise-No           BP (!) 142/68   Pulse 70   Ht 5\' 11"  (1.803 m)   Wt 246 lb (111.6 kg)   SpO2 97%   BMI 34.31 kg/m   Physical Exam:  Well appearing obese, middle-aged man, NAD HEENT: Unremarkable Neck:  7 cm JVD, no thyromegally Lungs:  Clear with no wheezes, rales,  or rhonchi. HEART:  Regular rate rhythm, no murmurs, no rubs, no clicks, soft S4 Abd:  soft, positive bowel sounds, no organomegally, no rebound, no guarding Ext:  2 plus pulses, no edema, no cyanosis, no clubbing Skin:  No rashes no nodules Neuro:  CN II through XII intact, motor grossly intact   Assess/Plan:  1. Chest pain - his symptoms are controlled. Will follow. 2. HTN heart disease - I have asked him to reduce his salt inake and lose weight. 3. ED - we discussed his taking sildanefil.

## 2016-09-01 NOTE — Patient Instructions (Signed)
Medication Instructions:  Your physician recommends that you continue on your current medications as directed. Please refer to the Current Medication list given to you today.  Labwork: None ordered.  Testing/Procedures: None ordered.  Follow-Up: Your physician wants you to follow-up in: 2 years with Dr. Taylor.   You will receive a reminder letter in the mail two months in advance. If you don't receive a letter, please call our office to schedule the follow-up appointment.   Any Other Special Instructions Will Be Listed Below (If Applicable).  If you need a refill on your cardiac medications before your next appointment, please call your pharmacy.   

## 2016-09-09 ENCOUNTER — Ambulatory Visit (INDEPENDENT_AMBULATORY_CARE_PROVIDER_SITE_OTHER): Payer: Medicare HMO | Admitting: Internal Medicine

## 2016-09-09 ENCOUNTER — Encounter: Payer: Self-pay | Admitting: Internal Medicine

## 2016-09-09 ENCOUNTER — Other Ambulatory Visit (INDEPENDENT_AMBULATORY_CARE_PROVIDER_SITE_OTHER): Payer: Medicare HMO

## 2016-09-09 DIAGNOSIS — E785 Hyperlipidemia, unspecified: Secondary | ICD-10-CM

## 2016-09-09 DIAGNOSIS — I10 Essential (primary) hypertension: Secondary | ICD-10-CM

## 2016-09-09 DIAGNOSIS — I259 Chronic ischemic heart disease, unspecified: Secondary | ICD-10-CM | POA: Diagnosis not present

## 2016-09-09 DIAGNOSIS — M10079 Idiopathic gout, unspecified ankle and foot: Secondary | ICD-10-CM

## 2016-09-09 DIAGNOSIS — N320 Bladder-neck obstruction: Secondary | ICD-10-CM

## 2016-09-09 DIAGNOSIS — E1159 Type 2 diabetes mellitus with other circulatory complications: Secondary | ICD-10-CM | POA: Diagnosis not present

## 2016-09-09 LAB — URINALYSIS
Bilirubin Urine: NEGATIVE
Ketones, ur: NEGATIVE
Leukocytes, UA: NEGATIVE
Nitrite: NEGATIVE
SPECIFIC GRAVITY, URINE: 1.02 (ref 1.000–1.030)
TOTAL PROTEIN, URINE-UPE24: NEGATIVE
Urine Glucose: 250 — AB
Urobilinogen, UA: 0.2 (ref 0.0–1.0)
pH: 6 (ref 5.0–8.0)

## 2016-09-09 LAB — MICROALBUMIN / CREATININE URINE RATIO
Creatinine,U: 120.1 mg/dL
Microalb Creat Ratio: 0.6 mg/g (ref 0.0–30.0)
Microalb, Ur: <0.7 mg/dL (ref 0.0–1.9)

## 2016-09-09 LAB — BASIC METABOLIC PANEL
BUN: 17 mg/dL (ref 6–23)
CHLORIDE: 103 meq/L (ref 96–112)
CO2: 29 meq/L (ref 19–32)
CREATININE: 1.14 mg/dL (ref 0.40–1.50)
Calcium: 9.7 mg/dL (ref 8.4–10.5)
GFR: 81.23 mL/min (ref >60.00)
GLUCOSE: 211 mg/dL — AB (ref 70–99)
POTASSIUM: 4.2 meq/L (ref 3.5–5.1)
Sodium: 139 mEq/L (ref 135–145)

## 2016-09-09 LAB — LIPID PANEL
Cholesterol: 167 mg/dL (ref 0–200)
HDL: 31.4 mg/dL — AB (ref >39.00)
NONHDL: 135.54
TRIGLYCERIDES: 371 mg/dL — AB (ref 0.0–149.0)
Total CHOL/HDL Ratio: 5
VLDL: 74.2 mg/dL — ABNORMAL HIGH (ref 0.0–40.0)

## 2016-09-09 LAB — HEPATIC FUNCTION PANEL
ALBUMIN: 4.1 g/dL (ref 3.5–5.2)
ALT: 17 U/L (ref 0–53)
AST: 13 U/L (ref 0–37)
Alkaline Phosphatase: 80 U/L (ref 39–117)
Bilirubin, Direct: 0.1 mg/dL (ref 0.0–0.3)
Total Bilirubin: 0.4 mg/dL (ref 0.2–1.2)
Total Protein: 7 g/dL (ref 6.0–8.3)

## 2016-09-09 LAB — PSA: PSA: 0.99 ng/mL (ref 0.10–4.00)

## 2016-09-09 LAB — LDL CHOLESTEROL, DIRECT: Direct LDL: 73 mg/dL

## 2016-09-09 LAB — HEMOGLOBIN A1C: HEMOGLOBIN A1C: 10 % — AB (ref 4.6–6.5)

## 2016-09-09 MED ORDER — ZOSTER VAC RECOMB ADJUVANTED 50 MCG/0.5ML IM SUSR: 0.5000 mL | Freq: Once | INTRAMUSCULAR | 1 refills | Status: AC

## 2016-09-09 NOTE — Assessment & Plan Note (Signed)
Diovan-HCT 

## 2016-09-09 NOTE — Progress Notes (Signed)
Subjective:  Patient ID: Johnny [name], male    DOB: 31-May-1945  Age: 71 y.o. MRN: [id number]  CC: No chief complaint on file.   HPI Johnny [name] presents for DM, HTN, CAD f/u  Outpatient Medications Prior to Visit  Medication Sig Dispense Refill  . ACCU-CHEK SOFTCLIX LANCETS lancets     . Alcohol Swabs (B-D SINGLE USE SWABS REGULAR) PADS 1 each by Does not apply route 2 (two) times daily as needed. Dx: 250.00 100 each 3  . aspirin 325 MG EC tablet Take 325 mg by mouth every morning.    . Blood Glucose Calibration (ACCU-CHEK AVIVA) SOLN Use as directed as needed. 1 each 2  . Blood Glucose Monitoring Suppl (ACCU-CHEK AVIVA PLUS) W/DEVICE KIT Use twice daily as directed. Dx: 250.00 1 kit 0  . cholecalciferol (VITAMIN D) 1000 UNITS tablet Take 1,000 Units by mouth every morning.    . colchicine 0.6 MG tablet Take 1 tablet (0.6 mg total) by mouth 4 (four) times daily as needed (gout attack). 60 tablet 1  . diazepam (VALIUM) 2 MG tablet Take 1-2 tablets (2-4 mg total) by mouth every 6 (six) hours as needed. Prn vertigo 60 tablet 0  . diclofenac (VOLTAREN) 75 MG EC tablet Take 1 tablet (75 mg total) by mouth 2 (two) times daily as needed for moderate pain (gout attack). 30 tablet 2  . finasteride (PROSCAR) 5 MG tablet TAKE 1 TABLET EVERY MORNING 90 tablet 2  . fluticasone furoate-vilanterol (BREO ELLIPTA) 100-25 MCG/INH AEPB Inhale 1 puff into the lungs daily. 1 each 5  . glimepiride (AMARYL) 4 MG tablet TAKE 1 TABLET EVERY DAY WITH BREAKFAST 90 tablet 2  . glucose blood (ACCU-CHEK AVIVA) test strip Use twice daily as instructed. Dx: 250.00 100 each 3  . Lancet Devices (ACCU-CHEK SOFTCLIX) lancets Use twice daily as instructed. Dx: 250.00 1 each 3  . lovastatin (MEVACOR) 40 MG tablet TAKE 1 TABLET EVERY MORNING 90 tablet 2  . meclizine (ANTIVERT) 12.5 MG tablet Take 1 tablet (12.5 mg total) by mouth 3 (three) times daily as needed for dizziness. 60 tablet 1  . metFORMIN (GLUCOPHAGE) 1000  MG tablet TAKE 1 TABLET TWICE DAILY WITH MEALS 180 tablet 2  . Multiple Vitamins-Minerals (MULTI-VITAMIN GUMMIES PO)     . nitroGLYCERIN (NITROSTAT) 0.4 MG SL tablet Place 1 tablet (0.4 mg total) under the tongue every 5 (five) minutes as needed for chest pain. 25 tablet 3  . pantoprazole (PROTONIX) 40 MG tablet TAKE 1 TABLET EVERY MORNING 90 tablet 2  . tamsulosin (FLOMAX) 0.4 MG CAPS capsule Take 1 capsule (0.4 mg total) by mouth daily. 90 capsule 3  . valsartan-hydrochlorothiazide (DIOVAN-HCT) 320-12.5 MG tablet Take 1 tablet by mouth daily. 90 tablet 2  . VENTOLIN HFA 108 (90 Base) MCG/ACT inhaler INHALE 2 PUFFS INTO THE LUNGS EVERY 4 HOURS AS NEEDED FOR WHEEZING OR SHORTNESS OF BREATH 18 g 5  . promethazine (PHENERGAN) 12.5 MG tablet Take 1-2 tablets (12.5-25 mg total) by mouth every 6 (six) hours as needed for nausea or vomiting. 60 tablet 0   No facility-administered medications prior to visit.     ROS Review of Systems  Constitutional: Negative for appetite change, fatigue and unexpected weight change.  HENT: Negative for congestion, nosebleeds, sneezing, sore throat and trouble swallowing.   Eyes: Negative for itching and visual disturbance.  Respiratory: Negative for cough.   Cardiovascular: Negative for chest pain, palpitations and leg swelling.  Gastrointestinal: Negative for abdominal distention,  blood in stool, diarrhea and nausea.  Genitourinary: Negative for frequency and hematuria.  Musculoskeletal: Positive for arthralgias and back pain. Negative for gait problem, joint swelling and neck pain.  Skin: Negative for rash.  Neurological: Negative for dizziness, tremors, speech difficulty and weakness.  Psychiatric/Behavioral: Negative for agitation, dysphoric mood and sleep disturbance. The patient is not nervous/anxious.     Objective:  BP 118/64 (BP Location: Right Arm, Patient Position: Sitting, Cuff Size: Large)   Pulse 78   Temp 98.1 F (36.7 C) (Oral)   Ht '5\' 11"'$   (1.803 m)   Wt 245 lb (111.1 kg)   SpO2 98%   BMI 34.17 kg/m   BP Readings from Last 3 Encounters:  09/09/16 118/64  09/01/16 (!) 142/68  06/15/16 140/60    Wt Readings from Last 3 Encounters:  09/09/16 245 lb (111.1 kg)  09/01/16 246 lb (111.6 kg)  06/15/16 249 lb 0.6 oz (113 kg)    Physical Exam  Constitutional: He is oriented to person, place, and time. He appears well-developed. No distress.  NAD  HENT:  Mouth/Throat: Oropharynx is clear and moist.  Eyes: Conjunctivae are normal. Pupils are equal, round, and reactive to light.  Neck: Normal range of motion. No JVD present. No thyromegaly present.  Cardiovascular: Normal rate, regular rhythm, normal heart sounds and intact distal pulses.  Exam reveals no gallop and no friction rub.   No murmur heard. Pulmonary/Chest: Effort normal and breath sounds normal. No respiratory distress. He has no wheezes. He has no rales. He exhibits no tenderness.  Abdominal: Soft. Bowel sounds are normal. He exhibits no distension and no mass. There is no tenderness. There is no rebound and no guarding.  Genitourinary: Rectum normal. Rectal exam shows guaiac negative stool.  Musculoskeletal: Normal range of motion. He exhibits no edema or tenderness.  Lymphadenopathy:    He has no cervical adenopathy.  Neurological: He is alert and oriented to person, place, and time. He has normal reflexes. No cranial nerve deficit. He exhibits normal muscle tone. He displays a negative Romberg sign. Coordination and gait normal.  Skin: Skin is warm and dry. No rash noted.  Psychiatric: He has a normal mood and affect. His behavior is normal. Judgment and thought content normal.  prostate 1+  Lab Results  Component Value Date   WBC 5.8 07/20/2013   HGB 11.4 (L) 07/20/2013   HCT 35.3 (L) 07/20/2013   PLT 258 07/20/2013   GLUCOSE 136 (H) 02/08/2016   CHOL 143 11/29/2013   TRIG 182.0 (H) 11/29/2013   HDL 32.70 (L) 11/29/2013   LDLDIRECT 78.1 09/16/2008    LDLCALC 74 11/29/2013   ALT 13 01/21/2013   AST 13 01/21/2013   NA 139 02/08/2016   K 3.9 02/08/2016   CL 102 02/08/2016   CREATININE 1.09 02/08/2016   BUN 16 02/08/2016   CO2 29 02/08/2016   TSH 3.59 10/14/2010   PSA 0.80 03/20/2015   INR 1.15 01/21/2013   HGBA1C 9.3 (H) 02/08/2016    Dg Chest 2 View  Result Date: 07/14/2015 CLINICAL DATA:  Shortness of breath and wheezing for 2 weeks EXAM: CHEST  2 VIEW COMPARISON:  07/20/2013 FINDINGS: Cardiac shadow is stable. Postsurgical changes are again seen. The lungs are well aerated bilaterally without evidence of focal infiltrate or sizable effusion. Moderate degenerative changes of thoracic spine are noted. IMPRESSION: No active cardiopulmonary disease. Electronically Signed   By: Inez Catalina M.D.   On: 07/14/2015 08:25    Assessment & Plan:  There are no diagnoses linked to this encounter. I am having Mr. Lewing maintain his aspirin, cholecalciferol, ACCU-CHEK AVIVA PLUS, ACCU-CHEK AVIVA, B-D SINGLE USE SWABS REGULAR, glucose blood, accu-chek softclix, ACCU-CHEK SOFTCLIX LANCETS, VENTOLIN HFA, fluticasone furoate-vilanterol, valsartan-hydrochlorothiazide, promethazine, diazepam, meclizine, glimepiride, lovastatin, pantoprazole, finasteride, metFORMIN, Multiple Vitamins-Minerals (MULTI-VITAMIN GUMMIES PO), tamsulosin, colchicine, diclofenac, and nitroGLYCERIN.  No orders of the defined types were placed in this encounter.    Follow-up: No Follow-up on file.  Walker Kehr, MD

## 2016-09-09 NOTE — Assessment & Plan Note (Addendum)
Appt w/Dr Loanne Drilling on 7/12 Labs Metformin, Glimepiride Diovan  and Lovastatin

## 2016-09-09 NOTE — Assessment & Plan Note (Addendum)
PSA Tamsulosin, Finesteride

## 2016-09-09 NOTE — Assessment & Plan Note (Signed)
No angina 

## 2016-09-09 NOTE — Assessment & Plan Note (Signed)
Dr Dennard Nip for weight loss - appt suggested

## 2016-09-09 NOTE — Assessment & Plan Note (Signed)
On Lovastatin

## 2016-09-09 NOTE — Assessment & Plan Note (Signed)
Diclofenac and colchicine

## 2016-09-09 NOTE — Patient Instructions (Signed)
Dr Caren Beasley - for weight loss  

## 2016-09-15 ENCOUNTER — Encounter: Payer: Self-pay | Admitting: Endocrinology

## 2016-09-15 ENCOUNTER — Ambulatory Visit (INDEPENDENT_AMBULATORY_CARE_PROVIDER_SITE_OTHER): Payer: Medicare HMO | Admitting: Endocrinology

## 2016-09-15 VITALS — BP 132/62 | HR 78 | Ht 71.0 in | Wt 241.0 lb

## 2016-09-15 DIAGNOSIS — E1159 Type 2 diabetes mellitus with other circulatory complications: Secondary | ICD-10-CM | POA: Diagnosis not present

## 2016-09-15 MED ORDER — SITAGLIPTIN PHOSPHATE 100 MG PO TABS: 100.0000 mg | ORAL_TABLET | Freq: Every day | ORAL | 11 refills | Status: DC

## 2016-09-15 NOTE — Progress Notes (Signed)
Subjective:    Patient ID: Johnny [name], male    DOB: Aug 21, 1945, 71 y.o.   MRN: [id number]  HPI pt is referred by Dr Alain Marion, for diabetes.  Pt states DM was dx'ed in 1999; he has mild if any neuropathy of the lower extremities; he has associated CAD and renal insuff; he has never been on insulin, except in the hospital; pt says his diet is not good, but exercise is good; he has never had pancreatitis, pancreatic surgery, severe hypoglycemia or DKA.  He takes 2 oral meds.  He has not recently checked cbg.   Past Medical History:  Diagnosis Date  . Arthritis   . At risk for sleep apnea    STOP-BANG=  6   SENT TO PCP 05-21-2013  . Benign positional vertigo   . Benign prostatic hypertrophy   . Blood transfusion without reported diagnosis 2000  . CAD (coronary artery disease)    CARDIOLOGIST--  DR Cristopher Peru  . CHF (congestive heart failure) ([facility])   . Chronic ischemic heart disease, unspecified   . ED (erectile dysfunction) of organic origin   . GERD (gastroesophageal reflux disease)   . History of CHF (congestive heart failure)    systolic  . HTN (hypertension)   . Hyperlipidemia   . Type 2 diabetes mellitus ([place])   . Wears glasses     Past Surgical History:  Procedure Laterality Date  . CARDIAC CATHETERIZATION  03-03-2000  dr gregg taylor   normal lvsf/  two-vessel cad significant complex stenosis at distal left main  . CARDIOVASCULAR STRESS TEST  08-09-2010  DR Carleene Overlie TAYLOR   normal lexiscan nuclear study/  no ischemia/  normal lvf/  ef 54%  . CATARACT EXTRACTION W/ INTRAOCULAR LENS IMPLANT Right 2011  . COLONOSCOPY  12-30-2009  . CORONARY ARTERY BYPASS GRAFT  03-06-2000   DR [id number]   third vessel  . PENILE PROSTHESIS IMPLANT  02-08-2010   COLOPLAST 3-PIECE INFLATABLE  . PENILE PROSTHESIS IMPLANT N/A 05/27/2013   Procedure: CYSTO REMOVAL OF PENILE PROSTHESIS;  Surgeon: Claybon Jabs, MD;  Location: [facility];  Service: Urology;  Laterality: N/A;    . POLYPECTOMY  12-30-2009   +TA  . TOTAL HIP ARTHROPLASTY  01/24/2011   Procedure: TOTAL HIP ARTHROPLASTY;  Surgeon: Dione Plover [name];  Location: [facility];  Service: Orthopedics;  Laterality: Left;  . TOTAL HIP ARTHROPLASTY Right 01/30/2013   Procedure: RIGHT TOTAL HIP ARTHROPLASTY;  Surgeon: Gearlean Alf, MD;  Location: [facility];  Service: Orthopedics;  Laterality: Right;    Social History   Social History  . Marital status: Married    Spouse name: N/A  . Number of children: N/A  . Years of education: N/A   Occupational History  . Preacher Retired   Social History Main Topics  . Smoking status: Former Smoker    Types: Cigarettes    Quit date: 05/22/1970  . Smokeless tobacco: Never Used  . Alcohol use No  . Drug use: No  . Sexual activity: Not on file   Other Topics Concern  . Not on file   Social History Narrative   Regular Exercise-No          Current Outpatient Prescriptions on File Prior to Visit  Medication Sig Dispense Refill  . ACCU-CHEK SOFTCLIX LANCETS lancets     . Alcohol Swabs (B-D SINGLE USE SWABS REGULAR) PADS 1 each by Does not apply route 2 (two) times daily as needed. Dx: 250.00 100 each  3  . aspirin 325 MG EC tablet Take 325 mg by mouth every morning.    . Blood Glucose Calibration (ACCU-CHEK AVIVA) SOLN Use as directed as needed. 1 each 2  . Blood Glucose Monitoring Suppl (ACCU-CHEK AVIVA PLUS) W/DEVICE KIT Use twice daily as directed. Dx: 250.00 1 kit 0  . cholecalciferol (VITAMIN D) 1000 UNITS tablet Take 1,000 Units by mouth every morning.    . colchicine 0.6 MG tablet Take 1 tablet (0.6 mg total) by mouth 4 (four) times daily as needed (gout attack). 60 tablet 1  . diazepam (VALIUM) 2 MG tablet Take 1-2 tablets (2-4 mg total) by mouth every 6 (six) hours as needed. Prn vertigo 60 tablet 0  . diclofenac (VOLTAREN) 75 MG EC tablet Take 1 tablet (75 mg total) by mouth 2 (two) times daily as needed for moderate pain (gout attack). 30 tablet 2  .  finasteride (PROSCAR) 5 MG tablet TAKE 1 TABLET EVERY MORNING 90 tablet 2  . fluticasone furoate-vilanterol (BREO ELLIPTA) 100-25 MCG/INH AEPB Inhale 1 puff into the lungs daily. 1 each 5  . glimepiride (AMARYL) 4 MG tablet TAKE 1 TABLET EVERY DAY WITH BREAKFAST 90 tablet 2  . glucose blood (ACCU-CHEK AVIVA) test strip Use twice daily as instructed. Dx: 250.00 100 each 3  . Lancet Devices (ACCU-CHEK SOFTCLIX) lancets Use twice daily as instructed. Dx: 250.00 1 each 3  . lovastatin (MEVACOR) 40 MG tablet TAKE 1 TABLET EVERY MORNING 90 tablet 2  . meclizine (ANTIVERT) 12.5 MG tablet Take 1 tablet (12.5 mg total) by mouth 3 (three) times daily as needed for dizziness. 60 tablet 1  . metFORMIN (GLUCOPHAGE) 1000 MG tablet TAKE 1 TABLET TWICE DAILY WITH MEALS 180 tablet 2  . Multiple Vitamins-Minerals (MULTI-VITAMIN GUMMIES PO)     . nitroGLYCERIN (NITROSTAT) 0.4 MG SL tablet Place 1 tablet (0.4 mg total) under the tongue every 5 (five) minutes as needed for chest pain. 25 tablet 3  . pantoprazole (PROTONIX) 40 MG tablet TAKE 1 TABLET EVERY MORNING 90 tablet 2  . tamsulosin (FLOMAX) 0.4 MG CAPS capsule Take 1 capsule (0.4 mg total) by mouth daily. 90 capsule 3  . valsartan-hydrochlorothiazide (DIOVAN-HCT) 320-12.5 MG tablet Take 1 tablet by mouth daily. 90 tablet 2  . VENTOLIN HFA 108 (90 Base) MCG/ACT inhaler INHALE 2 PUFFS INTO THE LUNGS EVERY 4 HOURS AS NEEDED FOR WHEEZING OR SHORTNESS OF BREATH 18 g 5  . promethazine (PHENERGAN) 12.5 MG tablet Take 1-2 tablets (12.5-25 mg total) by mouth every 6 (six) hours as needed for nausea or vomiting. 60 tablet 0   No current facility-administered medications on file prior to visit.     Allergies  Allergen Reactions  . Enalapril     SOB  . Hydrocodone-Acetaminophen Nausea Only    Can take codein    Family History  Problem Relation Age of Onset  . Hypertension Mother   . Diabetes Father   . Cancer - Other Brother        cancer all over  . Coronary  artery disease Other        1st degree male relative  . Hypertension Other   . Cancer Sister 42       ovarian ca  . Colon cancer Neg Hx     BP 132/62   Pulse 78   Ht _0  (1.803 m)   Wt 241 lb (109.3 kg)   SpO2 97%   BMI 33.61 kg/m    Review of Systems  denies weight loss, blurry vision, headache, chest pain, sob, n/v, urinary frequency, muscle cramps, excessive diaphoresis, memory loss, cold intolerance, rhinorrhea, and easy bruising.      Objective:   Physical Exam VS: see vs page GEN: no distress HEAD: head: no deformity eyes: no periorbital swelling, no proptosis external nose and ears are normal mouth: no lesion seen NECK: supple, thyroid is not enlarged CHEST [place]: no deformity LUNGS: clear to auscultation CV: reg rate and rhythm, no murmur ABD: abdomen is soft, nontender.  no hepatosplenomegaly.  not distended.  Self-reducing ventral hernia MUSCULOSKELETAL: muscle bulk and strength are grossly normal.  no obvious joint swelling.  gait is normal and steady EXTEMITIES: no deformity.  no ulcer on the feet.  feet are of normal color and temp.  1+ bilat leg edema.  There is bilateral onychomycosis of the toenails. PULSES: dorsalis pedis intact bilat.  no carotid bruit NEURO:  cn 2-12 grossly intact.   readily moves all 4's.  sensation is intact to touch on the feet SKIN:  Normal texture and temperature.  No rash or suspicious lesion is visible.  Old healed surgical scar of the left leg (vein harvest).   NODES:  None palpable at the neck.  PSYCH: alert, well-oriented.  Does not appear anxious nor depressed.    Lab Results  Component Value Date   HGBA1C 10.0 (H) 09/09/2016    I personally reviewed electrocardiogram tracing (09/01/16):  Indication: f/u risk factors Impression: NSR.  Old ASMI.  No hypertrophy. Compared to 2015: Q-waves are new.    I have reviewed outside records, and summarized:  Pt was noted to have elevated a1c, and referred here.  Other risk  factors were stable, and pt was feeling well.       Assessment & Plan:  Type 2 DM: with severe exacerbation: I advised insulin, but he declines.   Patient Instructions  good diet and exercise significantly improve the control of your diabetes.  please let me know if you wish to be referred to a dietician.  high blood sugar is very risky to your health.  you should see an eye doctor and dentist every year.  It is very important to get all recommended vaccinations.  Controlling your blood pressure and cholesterol drastically reduces the damage diabetes does to your body.  Those who smoke should quit.  Please discuss these with your doctor.  check your blood sugar once a day.  vary the time of day when you check, between before the 3 meals, and at bedtime.  also check if you have symptoms of your blood sugar being too high or too low.  please keep a record of the readings and bring it to your next appointment here (or you can bring the meter itself).  You can write it on any piece of paper.  please call us sooner if your blood sugar goes below 70, or if you have a lot of readings over 200.  I have sent a prescription to your pharmacy, to add "januvia."   Please call us next week, to tell us how the blood sugar is doing.  We can add another medication each week, as we need to.    Please come back for a follow-up appointment in 2 months.

## 2016-09-15 NOTE — Patient Instructions (Addendum)
good diet and exercise significantly improve the control of your diabetes.  please let me know if you wish to be referred to a dietician.  high blood sugar is very risky to your health.  you should see an eye doctor and dentist every year.  It is very important to get all recommended vaccinations.  Controlling your blood pressure and cholesterol drastically reduces the damage diabetes does to your body.  Those who smoke should quit.  Please discuss these with your doctor.  check your blood sugar once a day.  vary the time of day when you check, between before the 3 meals, and at bedtime.  also check if you have symptoms of your blood sugar being too high or too low.  please keep a record of the readings and bring it to your next appointment here (or you can bring the meter itself).  You can write it on any piece of paper.  please call us sooner if your blood sugar goes below 70, or if you have a lot of readings over 200.  I have sent a prescription to your pharmacy, to add "januvia."   Please call us next week, to tell us how the blood sugar is doing.  We can add another medication each week, as we need to.    Please come back for a follow-up appointment in 2 months.

## 2016-09-16 ENCOUNTER — Telehealth: Payer: Self-pay | Admitting: Internal Medicine

## 2016-09-16 MED ORDER — BROMOCRIPTINE MESYLATE 2.5 MG PO TABS: ORAL_TABLET | ORAL | 11 refills | Status: DC

## 2016-09-16 NOTE — Telephone Encounter (Signed)
Patient notified and had no further questions at this time.

## 2016-09-16 NOTE — Telephone Encounter (Signed)
See message and please advise on how to proceed, Thanks!

## 2016-09-16 NOTE — Telephone Encounter (Signed)
Instead, I have sent a prescription to your pharmacy, for "bromocriptine." Please call us next week, to tell us how the blood sugar is doing

## 2016-09-16 NOTE — Telephone Encounter (Signed)
Script for Johnny [name] was $242 after insurance. Unaffordable for pt.   Please advise.  Thank you,  -LL

## 2016-09-26 ENCOUNTER — Telehealth: Payer: Self-pay | Admitting: Endocrinology

## 2016-09-26 NOTE — Telephone Encounter (Signed)
Call and told patient that cbg's were good & to call back if starting seeing numbers under 70.

## 2016-09-26 NOTE — Telephone Encounter (Signed)
i'm glad the blood sugar is better Please continue the same medications Call if less than 70 I'll see you next time.

## 2016-09-26 NOTE — Telephone Encounter (Signed)
Patient's wife called in reference to patient's numbers. Patient has changed eating habits and received meds on 09/20/16. Since patient started meds the lowest his numbers have been is 93, highest has been 194. Patient would like to know if this is ok.  Please call patient and advise.

## 2016-10-04 ENCOUNTER — Encounter: Payer: Medicare HMO | Admitting: Nutrition

## 2016-10-10 ENCOUNTER — Other Ambulatory Visit: Payer: Self-pay | Admitting: Internal Medicine

## 2016-10-10 NOTE — Telephone Encounter (Signed)
D/c Diovan HCT and switch to Avalide 300-12.5 1 po qd #90 w/3 ref please. Thx

## 2016-10-11 ENCOUNTER — Other Ambulatory Visit: Payer: Self-pay

## 2016-10-11 MED ORDER — IRBESARTAN-HYDROCHLOROTHIAZIDE 300-12.5 MG PO TABS: 1.0000 | ORAL_TABLET | Freq: Every day | ORAL | 3 refills | Status: DC

## 2016-10-11 MED ORDER — IRBESARTAN-HYDROCHLOROTHIAZIDE 300-12.5 MG PO TABS: 1.0000 | ORAL_TABLET | Freq: Every day | ORAL | 2 refills | Status: DC

## 2016-10-11 NOTE — Telephone Encounter (Signed)
RX sent, pt notified  

## 2016-10-12 ENCOUNTER — Encounter: Payer: Medicare HMO | Attending: Endocrinology | Admitting: Skilled Nursing Facility1

## 2016-10-12 DIAGNOSIS — E1159 Type 2 diabetes mellitus with other circulatory complications: Secondary | ICD-10-CM | POA: Insufficient documentation

## 2016-10-12 DIAGNOSIS — Z713 Dietary counseling and surveillance: Secondary | ICD-10-CM | POA: Insufficient documentation

## 2016-10-13 ENCOUNTER — Encounter: Payer: Self-pay | Admitting: Skilled Nursing Facility1

## 2016-10-13 NOTE — Progress Notes (Signed)
Diabetes Self-Management Education  Visit Type: First/Initial  10/13/2016  Johnny [name], identified by name and date of birth, is a 71 y.o. male with a diagnosis of Diabetes: Type 2.   ASSESSMENT  There were no vitals taken for this visit. There is no height or weight on file to calculate BMI.  Pt arrived with his very supportive wife. Pts blood sugars; Fasting: 127, 98, 109, 117, 138, 127, 148; 2 hours after eating; 145, 119, 114, 155, 194, 88, 167. Pt states he needs to know what to eat because the ladies at church and his wife will not let him drink anything but water!      Diabetes Self-Management Education - 10/13/16 3716      Visit Information   Visit Type First/Initial     Initial Visit   Diabetes Type Type 2   Are you currently following a meal plan? No   Are you taking your medications as prescribed? Yes     Health Coping   How would you rate your overall health? Fair     Psychosocial Assessment   Patient Belief/Attitude about Diabetes Motivated to manage diabetes   Other persons present Spouse/SO     Pre-Education Assessment   Patient understands the diabetes disease and treatment process. Needs Instruction   Patient understands incorporating nutritional management into lifestyle. Needs Instruction   Patient undertands incorporating physical activity into lifestyle. Needs Instruction   Patient understands using medications safely. Needs Instruction   Patient understands monitoring blood glucose, interpreting and using results Needs Instruction   Patient understands prevention, detection, and treatment of acute complications. Needs Instruction   Patient understands prevention, detection, and treatment of chronic complications. Needs Instruction   Patient understands how to develop strategies to address psychosocial issues. Needs Instruction   Patient understands how to develop strategies to promote health/change behavior. Needs Instruction     Complications    How often do you check your blood sugar? 3-4 times/day   Fasting Blood glucose range (mg/dL) 70-129   Postprandial Blood glucose range (mg/dL) 130-179   Number of hypoglycemic episodes per month 0   Number of hyperglycemic episodes per week 0   Have you had a dilated eye exam in the past 12 months? Yes   Have you had a dental exam in the past 12 months? Yes   Are you checking your feet? No     Dietary Intake   Breakfast half banana and cheese   Lunch chicken biscuit   Dinner sandwich or soup   Snack (evening) baked chicken wings and greens     Exercise   Exercise Type Moderate (swimming / aerobic walking)   How many days per week to you exercise? 3   How many minutes per day do you exercise? 45   Total minutes per week of exercise 135     Patient Education   Previous Diabetes Education No   Disease state  Factors that contribute to the development of diabetes   Nutrition management  Role of diet in the treatment of diabetes and the relationship between the three main macronutrients and blood glucose level;Food label reading, portion sizes and measuring food.;Carbohydrate counting;Reviewed blood glucose goals for pre and post meals and how to evaluate the patients' food intake on their blood glucose level.   Physical activity and exercise  Role of exercise on diabetes management, blood pressure control and cardiac health.   Monitoring Purpose and frequency of SMBG.   Psychosocial adjustment Role of stress  on diabetes     Individualized Goals (developed by patient)   Nutrition General guidelines for healthy choices and portions discussed;Follow meal plan discussed;Adjust meds/carbs with exercise as discussed   Physical Activity Exercise 3-5 times per week;45 minutes per day   Medications take my medication as prescribed   Monitoring  test my blood glucose as discussed     Post-Education Assessment   Patient understands the diabetes disease and treatment process. Demonstrates  understanding / competency   Patient understands incorporating nutritional management into lifestyle. Demonstrates understanding / competency   Patient undertands incorporating physical activity into lifestyle. Demonstrates understanding / competency   Patient understands using medications safely. Demonstrates understanding / competency   Patient understands monitoring blood glucose, interpreting and using results Demonstrates understanding / competency   Patient understands prevention, detection, and treatment of acute complications. Demonstrates understanding / competency   Patient understands prevention, detection, and treatment of chronic complications. Demonstrates understanding / competency   Patient understands how to develop strategies to address psychosocial issues. Demonstrates understanding / competency   Patient understands how to develop strategies to promote health/change behavior. Demonstrates understanding / competency     Outcomes   Expected Outcomes Demonstrated interest in learning. Expect positive outcomes   Future DMSE PRN   Program Status Completed      Individualized Plan for Diabetes Self-Management Training:   Learning Objective:  Patient will have a greater understanding of diabetes self-management. Patient education plan is to attend individual and/or group sessions per assessed needs and concerns.   Plan:   There are no Patient Instructions on file for this visit.  Expected Outcomes:  Demonstrated interest in learning. Expect positive outcomes  Education material provided: Meal plan card, My Plate and Snack sheet  If problems or questions, patient to contact team via:  Phone  Future DSME appointment: PRN

## 2016-11-16 ENCOUNTER — Encounter: Payer: Self-pay | Admitting: Endocrinology

## 2016-11-16 ENCOUNTER — Ambulatory Visit (INDEPENDENT_AMBULATORY_CARE_PROVIDER_SITE_OTHER): Payer: Medicare HMO | Admitting: Endocrinology

## 2016-11-16 VITALS — BP 130/58 | HR 66 | Wt 239.8 lb

## 2016-11-16 DIAGNOSIS — E1159 Type 2 diabetes mellitus with other circulatory complications: Secondary | ICD-10-CM

## 2016-11-16 LAB — POCT GLYCOSYLATED HEMOGLOBIN (HGB A1C): Hemoglobin A1C: 7.1

## 2016-11-16 MED ORDER — BROMOCRIPTINE MESYLATE 2.5 MG PO TABS: 1.2500 mg | ORAL_TABLET | Freq: Every day | ORAL | 11 refills | Status: DC

## 2016-11-16 MED ORDER — GLIMEPIRIDE 2 MG PO TABS: 2.0000 mg | ORAL_TABLET | Freq: Every day | ORAL | 11 refills | Status: DC

## 2016-11-16 NOTE — Patient Instructions (Addendum)
check your blood sugar once a day.  vary the time of day when you check, between before the 3 meals, and at bedtime.  also check if you have symptoms of your blood sugar being too high or too low.  please keep a record of the readings and bring it to your next appointment here (or you can bring the meter itself).  You can write it on any piece of paper.  please call us sooner if your blood sugar goes below 70, or if you have a lot of readings over 200.  I have sent prescriptions to your pharmacy, to double the bromocriptine, and half the glimepiride.   Please continue the same metformin Please come back for a follow-up appointment in 2-3 months.

## 2016-11-16 NOTE — Progress Notes (Signed)
Subjective:    Patient ID: Johnny [name], male    DOB: [birth year], 71 y.o.   MRN: [id number]  HPI  Pt returns for f/u of diabetes mellitus: DM type: 2 Dx'ed: 6546 Complications: CAD and renal insuff; Therapy: 3 oral meds DKA: never Severe hypoglycemia: never Pancreatitis: never Pancreatic imaging: never Other: he has never been on insulin, except in the hospital; edema and renal insuff limit rx options.  Interval history: He brings a record of his cbg's which I have reviewed today.  It varies from 65-200.  pt states he feels well in general. Past Medical History:  Diagnosis Date  . Arthritis   . At risk for sleep apnea    STOP-BANG=  6   SENT TO PCP 05-21-2013  . Benign positional vertigo   . Benign prostatic hypertrophy   . Blood transfusion without reported diagnosis 2000  . CAD (coronary artery disease)    CARDIOLOGIST--  DR Cristopher Peru  . CHF (congestive heart failure) ([place])   . Chronic ischemic heart disease, unspecified   . ED (erectile dysfunction) of organic origin   . GERD (gastroesophageal reflux disease)   . History of CHF (congestive heart failure)    systolic  . HTN (hypertension)   . Hyperlipidemia   . Type 2 diabetes mellitus ([place])   . Wears glasses     Past Surgical History:  Procedure Laterality Date  . CARDIAC CATHETERIZATION  03-03-2000  dr gregg taylor   normal lvsf/  two-vessel cad significant complex stenosis at distal left main  . CARDIOVASCULAR STRESS TEST  08-09-2010  DR Carleene Overlie TAYLOR   normal lexiscan nuclear study/  no ischemia/  normal lvf/  ef 54%  . CATARACT EXTRACTION W/ INTRAOCULAR LENS IMPLANT Right 2011  . COLONOSCOPY  12-30-2009  . CORONARY ARTERY BYPASS GRAFT  03-06-2000   DR [id number]   third vessel  . PENILE PROSTHESIS IMPLANT  02-08-2010   COLOPLAST 3-PIECE INFLATABLE  . PENILE PROSTHESIS IMPLANT N/A 05/27/2013   Procedure: CYSTO REMOVAL OF PENILE PROSTHESIS;  Surgeon: Claybon Jabs, MD;  Location: [facility];  Service: Urology;  Laterality: N/A;  . POLYPECTOMY  12-30-2009   +TA  . TOTAL HIP ARTHROPLASTY  01/24/2011   Procedure: TOTAL HIP ARTHROPLASTY;  Surgeon: Dione Plover [name];  Location: [facility];  Service: Orthopedics;  Laterality: Left;  . TOTAL HIP ARTHROPLASTY Right 01/30/2013   Procedure: RIGHT TOTAL HIP ARTHROPLASTY;  Surgeon: Gearlean Alf, MD;  Location: [facility];  Service: Orthopedics;  Laterality: Right;    Social History   Social History  . Marital status: Married    Spouse name: N/A  . Number of children: N/A  . Years of education: N/A   Occupational History  . Preacher Retired   Social History Main Topics  . Smoking status: Former Smoker    Types: Cigarettes    Quit date: 05/22/1970  . Smokeless tobacco: Never Used  . Alcohol use No  . Drug use: No  . Sexual activity: Not on file   Other Topics Concern  . Not on file   Social History Narrative   Regular Exercise-No          Current Outpatient Prescriptions on File Prior to Visit  Medication Sig Dispense Refill  . ACCU-CHEK SOFTCLIX LANCETS lancets     . Alcohol Swabs (B-D SINGLE USE SWABS REGULAR) PADS 1 each by Does not apply route 2 (two) times daily as needed. Dx: 250.00 100 each 3  .  aspirin 325 MG EC tablet Take 325 mg by mouth every morning.    . Blood Glucose Calibration (ACCU-CHEK AVIVA) SOLN Use as directed as needed. 1 each 2  . Blood Glucose Monitoring Suppl (ACCU-CHEK AVIVA PLUS) W/DEVICE KIT Use twice daily as directed. Dx: 250.00 1 kit 0  . cholecalciferol (VITAMIN D) 1000 UNITS tablet Take 1,000 Units by mouth every morning.    . colchicine 0.6 MG tablet Take 1 tablet (0.6 mg total) by mouth 4 (four) times daily as needed (gout attack). 60 tablet 1  . diazepam (VALIUM) 2 MG tablet Take 1-2 tablets (2-4 mg total) by mouth every 6 (six) hours as needed. Prn vertigo 60 tablet 0  . diclofenac (VOLTAREN) 75 MG EC tablet Take 1 tablet (75 mg total) by mouth 2 (two) times daily as needed for  moderate pain (gout attack). 30 tablet 2  . finasteride (PROSCAR) 5 MG tablet TAKE 1 TABLET EVERY MORNING 90 tablet 2  . fluticasone furoate-vilanterol (BREO ELLIPTA) 100-25 MCG/INH AEPB Inhale 1 puff into the lungs daily. 1 each 5  . glucose blood (ACCU-CHEK AVIVA) test strip Use twice daily as instructed. Dx: 250.00 100 each 3  . irbesartan-hydrochlorothiazide (AVALIDE) 300-12.5 MG tablet Take 1 tablet by mouth daily. 90 tablet 2  . Lancet Devices (ACCU-CHEK SOFTCLIX) lancets Use twice daily as instructed. Dx: 250.00 1 each 3  . lovastatin (MEVACOR) 40 MG tablet TAKE 1 TABLET EVERY MORNING 90 tablet 2  . meclizine (ANTIVERT) 12.5 MG tablet Take 1 tablet (12.5 mg total) by mouth 3 (three) times daily as needed for dizziness. 60 tablet 1  . metFORMIN (GLUCOPHAGE) 1000 MG tablet TAKE 1 TABLET TWICE DAILY WITH MEALS 180 tablet 2  . Multiple Vitamins-Minerals (MULTI-VITAMIN GUMMIES PO)     . nitroGLYCERIN (NITROSTAT) 0.4 MG SL tablet Place 1 tablet (0.4 mg total) under the tongue every 5 (five) minutes as needed for chest pain. 25 tablet 3  . pantoprazole (PROTONIX) 40 MG tablet TAKE 1 TABLET EVERY MORNING 90 tablet 2  . promethazine (PHENERGAN) 12.5 MG tablet Take 1-2 tablets (12.5-25 mg total) by mouth every 6 (six) hours as needed for nausea or vomiting. 60 tablet 0  . tamsulosin (FLOMAX) 0.4 MG CAPS capsule Take 1 capsule (0.4 mg total) by mouth daily. 90 capsule 3  . VENTOLIN HFA 108 (90 Base) MCG/ACT inhaler INHALE 2 PUFFS INTO THE LUNGS EVERY 4 HOURS AS NEEDED FOR WHEEZING OR SHORTNESS OF BREATH 18 g 5   No current facility-administered medications on file prior to visit.     Allergies  Allergen Reactions  . Enalapril     SOB  . Hydrocodone-Acetaminophen Nausea Only    Can take codein    Family History  Problem Relation Age of Onset  . Hypertension Mother   . Diabetes Father   . Cancer - Other Brother        cancer all over  . Coronary artery disease Other        1st degree male  relative  . Hypertension Other   . Cancer Sister 77       ovarian ca  . Colon cancer Neg Hx     BP (!) 130/58   Pulse 66   Wt 239 lb 12.8 oz (108.8 kg)   SpO2 96%   BMI 33.45 kg/m   Review of Systems He denies hypoglycemia    Objective:   Physical Exam VITAL SIGNS:  See vs page GENERAL: no distress Pulses: foot pulses are intact  bilaterally.   MSK: no deformity of the feet or ankles.  CV: 1+ bilat edema of the legs.  Skin:  no ulcer on the feet or ankles.  normal color and temp on the feet and ankles.  Old healed surgical scar (vein harvest) at the left leg Neuro: sensation is intact to touch on the feet and ankles. Ext: There is bilateral onychomycosis of the toenails.       Lab Results  Component Value Date   CREATININE 1.14 09/09/2016   BUN 17 09/09/2016   NA 139 09/09/2016   K 4.2 09/09/2016   CL 103 09/09/2016   CO2 29 09/09/2016  a1c=7.1%    Assessment & Plan:  Type 2 DM: well-controlled Hypoglycemia: new.  He declines repaglinide, at least for now Renal insuff: I told pt he is not a good candidate for glimepiride. Advanced age: he is not a candidate for aggressive glycemic control  Patient Instructions  check your blood sugar once a day.  vary the time of day when you check, between before the 3 meals, and at bedtime.  also check if you have symptoms of your blood sugar being too high or too low.  please keep a record of the readings and bring it to your next appointment here (or you can bring the meter itself).  You can write it on any piece of paper.  please call us sooner if your blood sugar goes below 70, or if you have a lot of readings over 200.  I have sent prescriptions to your pharmacy, to double the bromocriptine, and half the glimepiride.   Please continue the same metformin Please come back for a follow-up appointment in 2-3 months.

## 2016-12-05 ENCOUNTER — Other Ambulatory Visit: Payer: Self-pay | Admitting: Internal Medicine

## 2016-12-16 ENCOUNTER — Ambulatory Visit (INDEPENDENT_AMBULATORY_CARE_PROVIDER_SITE_OTHER): Payer: Medicare HMO | Admitting: Internal Medicine

## 2016-12-16 ENCOUNTER — Encounter: Payer: Self-pay | Admitting: Internal Medicine

## 2016-12-16 VITALS — BP 126/68 | HR 70 | Temp 98.7°F | Ht 71.0 in | Wt 239.0 lb

## 2016-12-16 DIAGNOSIS — E1159 Type 2 diabetes mellitus with other circulatory complications: Secondary | ICD-10-CM

## 2016-12-16 DIAGNOSIS — Z23 Encounter for immunization: Secondary | ICD-10-CM

## 2016-12-16 LAB — GLUCOSE, POCT (MANUAL RESULT ENTRY): POC GLUCOSE: 193 mg/dL — AB (ref 70–99)

## 2016-12-16 NOTE — Assessment & Plan Note (Signed)
Vit D 

## 2016-12-16 NOTE — Assessment & Plan Note (Signed)
Diclofenac and colchicine prn

## 2016-12-16 NOTE — Progress Notes (Signed)
Subjective:  Patient ID: Johnny [name], male    DOB: 03/24/1945  Age: 71 y.o. MRN: [id number]  CC: No chief complaint on file.   HPI Johnny [name] presents for DM, CAD, HTN f/u  Outpatient Medications Prior to Visit  Medication Sig Dispense Refill  . ACCU-CHEK SOFTCLIX LANCETS lancets     . Alcohol Swabs (B-D SINGLE USE SWABS REGULAR) PADS 1 each by Does not apply route 2 (two) times daily as needed. Dx: 250.00 100 each 3  . aspirin 325 MG EC tablet Take 325 mg by mouth every morning.    . Blood Glucose Calibration (ACCU-CHEK AVIVA) SOLN Use as directed as needed. 1 each 2  . Blood Glucose Monitoring Suppl (ACCU-CHEK AVIVA PLUS) W/DEVICE KIT Use twice daily as directed. Dx: 250.00 1 kit 0  . bromocriptine (PARLODEL) 2.5 MG tablet Take 0.5 tablets (1.25 mg total) by mouth daily. 15 tablet 11  . cholecalciferol (VITAMIN D) 1000 UNITS tablet Take 1,000 Units by mouth every morning.    . colchicine 0.6 MG tablet Take 1 tablet (0.6 mg total) by mouth 4 (four) times daily as needed (gout attack). 60 tablet 1  . diazepam (VALIUM) 2 MG tablet Take 1-2 tablets (2-4 mg total) by mouth every 6 (six) hours as needed. Prn vertigo 60 tablet 0  . diclofenac (VOLTAREN) 75 MG EC tablet Take 1 tablet (75 mg total) by mouth 2 (two) times daily as needed for moderate pain (gout attack). 30 tablet 2  . finasteride (PROSCAR) 5 MG tablet TAKE 1 TABLET EVERY MORNING 90 tablet 2  . fluticasone furoate-vilanterol (BREO ELLIPTA) 100-25 MCG/INH AEPB Inhale 1 puff into the lungs daily. 1 each 5  . glimepiride (AMARYL) 2 MG tablet Take 1 tablet (2 mg total) by mouth daily before breakfast. 30 tablet 11  . glucose blood (ACCU-CHEK AVIVA) test strip Use twice daily as instructed. Dx: 250.00 100 each 3  . irbesartan-hydrochlorothiazide (AVALIDE) 300-12.5 MG tablet Take 1 tablet by mouth daily. 90 tablet 2  . Lancet Devices (ACCU-CHEK SOFTCLIX) lancets Use twice daily as instructed. Dx: 250.00 1 each 3  . lovastatin  (MEVACOR) 40 MG tablet TAKE 1 TABLET EVERY MORNING 90 tablet 2  . meclizine (ANTIVERT) 12.5 MG tablet Take 1 tablet (12.5 mg total) by mouth 3 (three) times daily as needed for dizziness. 60 tablet 1  . metFORMIN (GLUCOPHAGE) 1000 MG tablet TAKE 1 TABLET TWICE DAILY WITH MEALS 180 tablet 2  . Multiple Vitamins-Minerals (MULTI-VITAMIN GUMMIES PO)     . nitroGLYCERIN (NITROSTAT) 0.4 MG SL tablet Place 1 tablet (0.4 mg total) under the tongue every 5 (five) minutes as needed for chest pain. 25 tablet 3  . pantoprazole (PROTONIX) 40 MG tablet TAKE 1 TABLET EVERY MORNING 90 tablet 2  . tamsulosin (FLOMAX) 0.4 MG CAPS capsule Take 1 capsule (0.4 mg total) by mouth daily. 90 capsule 3  . VENTOLIN HFA 108 (90 Base) MCG/ACT inhaler INHALE 2 PUFFS INTO THE LUNGS EVERY 4 HOURS AS NEEDED FOR WHEEZING OR SHORTNESS OF BREATH 18 g 5  . promethazine (PHENERGAN) 12.5 MG tablet Take 1-2 tablets (12.5-25 mg total) by mouth every 6 (six) hours as needed for nausea or vomiting. 60 tablet 0  . glimepiride (AMARYL) 4 MG tablet TAKE 1 TABLET EVERY DAY WITH BREAKFAST 90 tablet 2   No facility-administered medications prior to visit.     ROS Review of Systems  Constitutional: Negative for appetite change, fatigue and unexpected weight change.  HENT: Negative  for congestion, nosebleeds, sneezing, sore throat and trouble swallowing.   Eyes: Negative for itching and visual disturbance.  Respiratory: Negative for cough.   Cardiovascular: Negative for chest pain, palpitations and leg swelling.  Gastrointestinal: Negative for abdominal distention, blood in stool, diarrhea and nausea.  Genitourinary: Negative for frequency and hematuria.  Musculoskeletal: Negative for back pain, gait problem, joint swelling and neck pain.  Skin: Negative for rash.  Neurological: Negative for dizziness, tremors, speech difficulty and weakness.  Psychiatric/Behavioral: Negative for agitation, dysphoric mood, sleep disturbance and suicidal  ideas. The patient is not nervous/anxious.     Objective:  BP 126/68 (BP Location: Left Arm, Patient Position: Sitting, Cuff Size: Large)   Pulse 70   Temp 98.7 F (37.1 C) (Oral)   Ht _0  (1.803 m)   Wt 239 lb (108.4 kg)   SpO2 99%   BMI 33.33 kg/m   BP Readings from Last 3 Encounters:  12/16/16 126/68  11/16/16 (!) 130/58  09/15/16 132/62    Wt Readings from Last 3 Encounters:  12/16/16 239 lb (108.4 kg)  11/16/16 239 lb 12.8 oz (108.8 kg)  10/13/16 244 lb (110.7 kg)    Physical Exam  Constitutional: He is oriented to person, place, and time. He appears well-developed. No distress.  NAD  HENT:  Mouth/Throat: Oropharynx is clear and moist.  Eyes: Pupils are equal, round, and reactive to light. Conjunctivae are normal.  Neck: Normal range of motion. No JVD present. No thyromegaly present.  Cardiovascular: Normal rate, regular rhythm, normal heart sounds and intact distal pulses.  Exam reveals no gallop and no friction rub.   No murmur heard. Pulmonary/Chest: Effort normal and breath sounds normal. No respiratory distress. He has no wheezes. He has no rales. He exhibits no tenderness.  Abdominal: Soft. Bowel sounds are normal. He exhibits no distension and no mass. There is no tenderness. There is no rebound and no guarding.  Musculoskeletal: Normal range of motion. He exhibits tenderness. He exhibits no edema.  Lymphadenopathy:    He has no cervical adenopathy.  Neurological: He is alert and oriented to person, place, and time. He has normal reflexes. No cranial nerve deficit. He exhibits normal muscle tone. He displays a negative Romberg sign. Coordination and gait normal.  Skin: Skin is warm and dry. No rash noted.  Psychiatric: He has a normal mood and affect. His behavior is normal. Judgment and thought content normal.    Lab Results  Component Value Date   WBC 5.8 07/20/2013   HGB 11.4 (L) 07/20/2013   HCT 35.3 (L) 07/20/2013   PLT 258 07/20/2013   GLUCOSE  211 (H) 09/09/2016   CHOL 167 09/09/2016   TRIG 371.0 (H) 09/09/2016   HDL 31.40 (L) 09/09/2016   LDLDIRECT 73.0 09/09/2016   LDLCALC 74 11/29/2013   ALT 17 09/09/2016   AST 13 09/09/2016   NA 139 09/09/2016   K 4.2 09/09/2016   CL 103 09/09/2016   CREATININE 1.14 09/09/2016   BUN 17 09/09/2016   CO2 29 09/09/2016   TSH 3.59 10/14/2010   PSA 0.99 09/09/2016   INR 1.15 01/21/2013   HGBA1C 7.1 11/16/2016   MICROALBUR <0.7 09/09/2016    Dg Chest 2 View  Result Date: 07/14/2015 CLINICAL DATA:  Shortness of breath and wheezing for 2 weeks EXAM: CHEST  2 VIEW COMPARISON:  07/20/2013 FINDINGS: Cardiac shadow is stable. Postsurgical changes are again seen. The lungs are well aerated bilaterally without evidence of focal infiltrate or sizable effusion. Moderate  degenerative changes of thoracic spine are noted. IMPRESSION: No active cardiopulmonary disease. Electronically Signed   By: Inez Catalina M.D.   On: 07/14/2015 08:25    Assessment & Plan:   There are no diagnoses linked to this encounter. I am having Mr. Jakes maintain his aspirin, cholecalciferol, ACCU-CHEK AVIVA PLUS, ACCU-CHEK AVIVA, B-D SINGLE USE SWABS REGULAR, glucose blood, accu-chek softclix, ACCU-CHEK SOFTCLIX LANCETS, VENTOLIN HFA, fluticasone furoate-vilanterol, promethazine, diazepam, meclizine, Multiple Vitamins-Minerals (MULTI-VITAMIN GUMMIES PO), tamsulosin, colchicine, diclofenac, nitroGLYCERIN, irbesartan-hydrochlorothiazide, bromocriptine, glimepiride, metFORMIN, finasteride, pantoprazole, and lovastatin.  No orders of the defined types were placed in this encounter.    Follow-up: No Follow-up on file.  Walker Kehr, MD

## 2016-12-16 NOTE — Assessment & Plan Note (Signed)
S/p eval by Dr Loanne Drilling  On Metformin, Glimepiride, Bromocriptine; Diovan  and Lovastatin

## 2016-12-16 NOTE — Assessment & Plan Note (Signed)
Losartan, NTG prn, Lovastatin, ASA 

## 2017-01-16 ENCOUNTER — Ambulatory Visit: Payer: Medicare HMO | Admitting: Endocrinology

## 2017-01-18 ENCOUNTER — Ambulatory Visit: Payer: Medicare HMO | Admitting: Endocrinology

## 2017-01-18 ENCOUNTER — Encounter: Payer: Self-pay | Admitting: Endocrinology

## 2017-01-18 VITALS — BP 118/62 | HR 62 | Wt 242.2 lb

## 2017-01-18 DIAGNOSIS — E1159 Type 2 diabetes mellitus with other circulatory complications: Secondary | ICD-10-CM

## 2017-01-18 LAB — POCT GLYCOSYLATED HEMOGLOBIN (HGB A1C): HEMOGLOBIN A1C: 6.5

## 2017-01-18 MED ORDER — GLIMEPIRIDE 1 MG PO TABS: 1.0000 mg | ORAL_TABLET | Freq: Every day | ORAL | 11 refills | Status: DC

## 2017-01-18 NOTE — Patient Instructions (Addendum)
check your blood sugar once a day.  vary the time of day when you check, between before the 3 meals, and at bedtime.  also check if you have symptoms of your blood sugar being too high or too low.  please keep a record of the readings and bring it to your next appointment here (or you can bring the meter itself).  You can write it on any piece of paper.  please call us sooner if your blood sugar goes below 70, or if you have a lot of readings over 200.  I have sent prescriptions to your pharmacy, to reduce the glimepiride to 1 mg daily.  Our goal is to phase this out.  Please continue the same metformin and bromocriptine.  Please come back for a follow-up appointment in 3 months.

## 2017-01-18 NOTE — Progress Notes (Signed)
Subjective:    Patient ID: Johnny [name], male    DOB: Jul 25, 1945, 71 y.o.   MRN: [id number]  HPI Pt returns for f/u of diabetes mellitus: DM type: 2 Dx'ed: 2035 Complications: CAD and renal insuff. Therapy: 3 oral meds DKA: never Severe hypoglycemia: never Pancreatitis: never Pancreatic imaging: never Other: he has never been on insulin, except in the hospital; edema and renal insuff limit rx options; he cannot afford name brand meds Interval history: He brings a record of his cbg's which I have reviewed today.  It varies from 65-122.  pt states he feels well in general.   Past Medical History:  Diagnosis Date  . Arthritis   . At risk for sleep apnea    STOP-BANG=  6   SENT TO PCP 05-21-2013  . Benign positional vertigo   . Benign prostatic hypertrophy   . Blood transfusion without reported diagnosis 2000  . CAD (coronary artery disease)    CARDIOLOGIST--  DR Cristopher Peru  . CHF (congestive heart failure) ([place])   . Chronic ischemic heart disease, unspecified   . ED (erectile dysfunction) of organic origin   . GERD (gastroesophageal reflux disease)   . History of CHF (congestive heart failure)    systolic  . HTN (hypertension)   . Hyperlipidemia   . Type 2 diabetes mellitus ([place])   . Wears glasses     Past Surgical History:  Procedure Laterality Date  . CARDIAC CATHETERIZATION  03-03-2000  dr gregg taylor   normal lvsf/  two-vessel cad significant complex stenosis at distal left main  . CARDIOVASCULAR STRESS TEST  08-09-2010  DR Carleene Overlie TAYLOR   normal lexiscan nuclear study/  no ischemia/  normal lvf/  ef 54%  . CATARACT EXTRACTION W/ INTRAOCULAR LENS IMPLANT Right 2011  . COLONOSCOPY  12-30-2009  . CORONARY ARTERY BYPASS GRAFT  03-06-2000   DR DHRCBULA   third vessel  . CYSTO REMOVAL OF PENILE PROSTHESIS N/A 05/27/2013   Performed by Kathie Rhodes, MD at [facility]  . PENILE PROSTHESIS IMPLANT  02-08-2010   COLOPLAST 3-PIECE INFLATABLE  .  POLYPECTOMY  12-30-2009   +TA  . RIGHT TOTAL HIP ARTHROPLASTY Right 01/30/2013   Performed by Gearlean Alf, MD at [facility]  . TOTAL HIP ARTHROPLASTY Left 01/24/2011   Performed by Gearlean Alf, MD at [facility]    Social History   Socioeconomic History  . Marital status: Married    Spouse name: Not on file  . Number of children: Not on file  . Years of education: Not on file  . Highest education level: Not on file  Social Needs  . Financial resource strain: Not on file  . Food insecurity - worry: Not on file  . Food insecurity - inability: Not on file  . Transportation needs - medical: Not on file  . Transportation needs - non-medical: Not on file  Occupational History  . Occupation: Engineer, building services: RETIRED  Tobacco Use  . Smoking status: Former Smoker    Types: Cigarettes    Last attempt to quit: 05/22/1970    Years since quitting: 46.6  . Smokeless tobacco: Never Used  Substance and Sexual Activity  . Alcohol use: No    Alcohol/week: 0.0 oz  . Drug use: No  . Sexual activity: Not on file  Other Topics Concern  . Not on file  Social History Narrative   Regular Exercise-No  Current Outpatient Medications on File Prior to Visit  Medication Sig Dispense Refill  . ACCU-CHEK SOFTCLIX LANCETS lancets     . Alcohol Swabs (B-D SINGLE USE SWABS REGULAR) PADS 1 each by Does not apply route 2 (two) times daily as needed. Dx: 250.00 100 each 3  . aspirin 325 MG EC tablet Take 325 mg by mouth every morning.    . Blood Glucose Calibration (ACCU-CHEK AVIVA) SOLN Use as directed as needed. 1 each 2  . Blood Glucose Monitoring Suppl (ACCU-CHEK AVIVA PLUS) W/DEVICE KIT Use twice daily as directed. Dx: 250.00 1 kit 0  . bromocriptine (PARLODEL) 2.5 MG tablet Take 0.5 tablets (1.25 mg total) by mouth daily. 15 tablet 11  . cholecalciferol (VITAMIN D) 1000 UNITS tablet Take 1,000 Units by mouth every morning.    . colchicine 0.6 MG tablet Take 1 tablet (0.6 mg total)  by mouth 4 (four) times daily as needed (gout attack). 60 tablet 1  . diazepam (VALIUM) 2 MG tablet Take 1-2 tablets (2-4 mg total) by mouth every 6 (six) hours as needed. Prn vertigo 60 tablet 0  . diclofenac (VOLTAREN) 75 MG EC tablet Take 1 tablet (75 mg total) by mouth 2 (two) times daily as needed for moderate pain (gout attack). 30 tablet 2  . finasteride (PROSCAR) 5 MG tablet TAKE 1 TABLET EVERY MORNING 90 tablet 2  . fluticasone furoate-vilanterol (BREO ELLIPTA) 100-25 MCG/INH AEPB Inhale 1 puff into the lungs daily. 1 each 5  . glucose blood (ACCU-CHEK AVIVA) test strip Use twice daily as instructed. Dx: 250.00 100 each 3  . irbesartan-hydrochlorothiazide (AVALIDE) 300-12.5 MG tablet Take 1 tablet by mouth daily. 90 tablet 2  . Lancet Devices (ACCU-CHEK SOFTCLIX) lancets Use twice daily as instructed. Dx: 250.00 1 each 3  . lovastatin (MEVACOR) 40 MG tablet TAKE 1 TABLET EVERY MORNING 90 tablet 2  . meclizine (ANTIVERT) 12.5 MG tablet Take 1 tablet (12.5 mg total) by mouth 3 (three) times daily as needed for dizziness. 60 tablet 1  . metFORMIN (GLUCOPHAGE) 1000 MG tablet TAKE 1 TABLET TWICE DAILY WITH MEALS 180 tablet 2  . Multiple Vitamins-Minerals (MULTI-VITAMIN GUMMIES PO)     . nitroGLYCERIN (NITROSTAT) 0.4 MG SL tablet Place 1 tablet (0.4 mg total) under the tongue every 5 (five) minutes as needed for chest pain. 25 tablet 3  . pantoprazole (PROTONIX) 40 MG tablet TAKE 1 TABLET EVERY MORNING 90 tablet 2  . tamsulosin (FLOMAX) 0.4 MG CAPS capsule Take 1 capsule (0.4 mg total) by mouth daily. 90 capsule 3  . VENTOLIN HFA 108 (90 Base) MCG/ACT inhaler INHALE 2 PUFFS INTO THE LUNGS EVERY 4 HOURS AS NEEDED FOR WHEEZING OR SHORTNESS OF BREATH 18 g 5  . promethazine (PHENERGAN) 12.5 MG tablet Take 1-2 tablets (12.5-25 mg total) by mouth every 6 (six) hours as needed for nausea or vomiting. 60 tablet 0   No current facility-administered medications on file prior to visit.     Allergies    Allergen Reactions  . Enalapril     SOB  . Hydrocodone-Acetaminophen Nausea Only    Can take codein    Family History  Problem Relation Age of Onset  . Hypertension Mother   . Diabetes Father   . Cancer - Other Brother        cancer all over  . Coronary artery disease Other        1st degree male relative  . Hypertension Other   . Cancer Sister 74         ovarian ca  . Colon cancer Neg Hx     BP 118/62 (BP Location: Left Arm, Patient Position: Sitting, Cuff Size: Normal)   Pulse 62   Wt 242 lb 3.2 oz (109.9 kg)   SpO2 97%   BMI 33.78 kg/m    Review of Systems He denies hypoglycemia    Objective:   Physical Exam VITAL SIGNS:  See vs page GENERAL: no distress Pulses: foot pulses are intact bilaterally.   MSK: no deformity of the feet or ankles.  CV: 1+ bilat edema of the legs or ankles Skin:  no ulcer on the feet or ankles.  normal color and temp on the feet and ankles Neuro: sensation is intact to touch on the feet and ankles.     Lab Results  Component Value Date   HGBA1C 7.1 11/16/2016   Lab Results  Component Value Date   CREATININE 1.14 09/09/2016   BUN 17 09/09/2016   NA 139 09/09/2016   K 4.2 09/09/2016   CL 103 09/09/2016   CO2 29 09/09/2016        Assessment & Plan:  Type 2 DM: well-controlled Renal insuff: he needs to minimize or D/C glimiperide.  Edema: this limits rx options.  Patient Instructions  check your blood sugar once a day.  vary the time of day when you check, between before the 3 meals, and at bedtime.  also check if you have symptoms of your blood sugar being too high or too low.  please keep a record of the readings and bring it to your next appointment here (or you can bring the meter itself).  You can write it on any piece of paper.  please call us sooner if your blood sugar goes below 70, or if you have a lot of readings over 200.  I have sent prescriptions to your pharmacy, to reduce the glimepiride to 1 mg daily.  Our goal  is to phase this out.  Please continue the same metformin and bromocriptine.  Please come back for a follow-up appointment in 3 months.

## 2017-02-16 DIAGNOSIS — E119 Type 2 diabetes mellitus without complications: Secondary | ICD-10-CM | POA: Diagnosis not present

## 2017-02-16 DIAGNOSIS — H35342 Macular cyst, hole, or pseudohole, left eye: Secondary | ICD-10-CM | POA: Diagnosis not present

## 2017-02-16 DIAGNOSIS — H353131 Nonexudative age-related macular degeneration, bilateral, early dry stage: Secondary | ICD-10-CM | POA: Diagnosis not present

## 2017-02-16 DIAGNOSIS — H35033 Hypertensive retinopathy, bilateral: Secondary | ICD-10-CM | POA: Diagnosis not present

## 2017-02-16 DIAGNOSIS — H26493 Other secondary cataract, bilateral: Secondary | ICD-10-CM | POA: Diagnosis not present

## 2017-02-24 ENCOUNTER — Telehealth: Payer: Self-pay | Admitting: Internal Medicine

## 2017-02-24 ENCOUNTER — Telehealth: Payer: Self-pay | Admitting: Endocrinology

## 2017-02-24 MED ORDER — DICLOFENAC SODIUM 75 MG PO TBEC: 75.0000 mg | DELAYED_RELEASE_TABLET | Freq: Two times a day (BID) | ORAL | 2 refills | Status: DC | PRN

## 2017-02-24 NOTE — Telephone Encounter (Signed)
Reviewed chart pt is up-to-date sent refills to [facility].../lmb  

## 2017-02-24 NOTE — Telephone Encounter (Signed)
Has questions about about script, needs some clarification on it.     Please advise

## 2017-02-24 NOTE — Telephone Encounter (Signed)
Copied from [place] [id number]. Topic: Quick Communication - See Telephone Encounter >> Feb 24, 2017 11:03 AM Conception Chancy, NT wrote: CRM for notification. See Telephone encounter for:  02/24/17.  Patient is needing a refill for his gout medication diclofenac and would like for that to be sent to the [facility] [address] that is on file. Thank you

## 2017-02-27 NOTE — Telephone Encounter (Signed)
I left patient VM to call back for script clarification.

## 2017-03-01 ENCOUNTER — Telehealth: Payer: Self-pay

## 2017-03-01 NOTE — Telephone Encounter (Signed)
Patient's wife called & stated that bromocriptine was increased to 1/2 tablet daily. The prescription however doesn't reflect increase & he is running out early. He needs prescription increased & sent to pahrmacy. Please advise?

## 2017-03-02 ENCOUNTER — Other Ambulatory Visit: Payer: Self-pay

## 2017-03-02 MED ORDER — BROMOCRIPTINE MESYLATE 2.5 MG PO TABS: 1.2500 mg | ORAL_TABLET | Freq: Every day | ORAL | 99 refills | Status: DC

## 2017-03-02 NOTE — Telephone Encounter (Signed)
I called but received no answer to inform patient that prescription was sent in.

## 2017-03-02 NOTE — Telephone Encounter (Signed)
Ava for 1/2 pill per day.  Please refill prn

## 2017-03-24 ENCOUNTER — Encounter: Payer: Self-pay | Admitting: Internal Medicine

## 2017-03-24 ENCOUNTER — Ambulatory Visit (INDEPENDENT_AMBULATORY_CARE_PROVIDER_SITE_OTHER): Payer: Medicare HMO | Admitting: Internal Medicine

## 2017-03-24 DIAGNOSIS — N320 Bladder-neck obstruction: Secondary | ICD-10-CM | POA: Diagnosis not present

## 2017-03-24 DIAGNOSIS — I259 Chronic ischemic heart disease, unspecified: Secondary | ICD-10-CM

## 2017-03-24 DIAGNOSIS — E1159 Type 2 diabetes mellitus with other circulatory complications: Secondary | ICD-10-CM | POA: Diagnosis not present

## 2017-03-24 DIAGNOSIS — I2510 Atherosclerotic heart disease of native coronary artery without angina pectoris: Secondary | ICD-10-CM | POA: Diagnosis not present

## 2017-03-24 MED ORDER — BROMOCRIPTINE MESYLATE 2.5 MG PO TABS: 1.2500 mg | ORAL_TABLET | Freq: Every day | ORAL | 11 refills | Status: DC

## 2017-03-24 NOTE — Assessment & Plan Note (Signed)
Dr Karsten Ro Chronic  Tamsulosin, Finesteride

## 2017-03-24 NOTE — Progress Notes (Signed)
Subjective:  Patient ID: Johnny [name], male    DOB: 1945/12/23  Age: 72 y.o. MRN: [id number]  CC: No chief complaint on file.   HPI Johnny [name] presents for DM, HTN, OA, CAD f/u  Outpatient Medications Prior to Visit  Medication Sig Dispense Refill  . ACCU-CHEK SOFTCLIX LANCETS lancets     . Alcohol Swabs (B-D SINGLE USE SWABS REGULAR) PADS 1 each by Does not apply route 2 (two) times daily as needed. Dx: 250.00 100 each 3  . aspirin 325 MG EC tablet Take 325 mg by mouth every morning.    . Blood Glucose Calibration (ACCU-CHEK AVIVA) SOLN Use as directed as needed. 1 each 2  . Blood Glucose Monitoring Suppl (ACCU-CHEK AVIVA PLUS) W/DEVICE KIT Use twice daily as directed. Dx: 250.00 1 kit 0  . bromocriptine (PARLODEL) 2.5 MG tablet Take 0.5 tablets (1.25 mg total) by mouth daily. 30 tablet prn  . cholecalciferol (VITAMIN D) 1000 UNITS tablet Take 1,000 Units by mouth every morning.    . colchicine 0.6 MG tablet Take 1 tablet (0.6 mg total) by mouth 4 (four) times daily as needed (gout attack). 60 tablet 1  . diazepam (VALIUM) 2 MG tablet Take 1-2 tablets (2-4 mg total) by mouth every 6 (six) hours as needed. Prn vertigo 60 tablet 0  . diclofenac (VOLTAREN) 75 MG EC tablet Take 1 tablet (75 mg total) by mouth 2 (two) times daily as needed for moderate pain (gout attack). 30 tablet 2  . finasteride (PROSCAR) 5 MG tablet TAKE 1 TABLET EVERY MORNING 90 tablet 2  . fluticasone furoate-vilanterol (BREO ELLIPTA) 100-25 MCG/INH AEPB Inhale 1 puff into the lungs daily. 1 each 5  . glimepiride (AMARYL) 1 MG tablet Take 1 tablet (1 mg total) daily with breakfast by mouth. 30 tablet 11  . glucose blood (ACCU-CHEK AVIVA) test strip Use twice daily as instructed. Dx: 250.00 100 each 3  . irbesartan-hydrochlorothiazide (AVALIDE) 300-12.5 MG tablet Take 1 tablet by mouth daily. 90 tablet 2  . Lancet Devices (ACCU-CHEK SOFTCLIX) lancets Use twice daily as instructed. Dx: 250.00 1 each 3  .  lovastatin (MEVACOR) 40 MG tablet TAKE 1 TABLET EVERY MORNING 90 tablet 2  . meclizine (ANTIVERT) 12.5 MG tablet Take 1 tablet (12.5 mg total) by mouth 3 (three) times daily as needed for dizziness. 60 tablet 1  . metFORMIN (GLUCOPHAGE) 1000 MG tablet TAKE 1 TABLET TWICE DAILY WITH MEALS 180 tablet 2  . Multiple Vitamins-Minerals (MULTI-VITAMIN GUMMIES PO)     . nitroGLYCERIN (NITROSTAT) 0.4 MG SL tablet Place 1 tablet (0.4 mg total) under the tongue every 5 (five) minutes as needed for chest pain. 25 tablet 3  . pantoprazole (PROTONIX) 40 MG tablet TAKE 1 TABLET EVERY MORNING 90 tablet 2  . tamsulosin (FLOMAX) 0.4 MG CAPS capsule Take 1 capsule (0.4 mg total) by mouth daily. 90 capsule 3  . VENTOLIN HFA 108 (90 Base) MCG/ACT inhaler INHALE 2 PUFFS INTO THE LUNGS EVERY 4 HOURS AS NEEDED FOR WHEEZING OR SHORTNESS OF BREATH 18 g 5  . promethazine (PHENERGAN) 12.5 MG tablet Take 1-2 tablets (12.5-25 mg total) by mouth every 6 (six) hours as needed for nausea or vomiting. 60 tablet 0  . valsartan-hydrochlorothiazide (DIOVAN-HCT) 320-12.5 MG tablet Take by mouth.     No facility-administered medications prior to visit.     ROS Review of Systems  Constitutional: Negative for appetite change, fatigue and unexpected weight change.  HENT: Negative for congestion, nosebleeds, sneezing,  sore throat and trouble swallowing.   Eyes: Negative for itching and visual disturbance.  Respiratory: Negative for cough.   Cardiovascular: Negative for chest pain, palpitations and leg swelling.  Gastrointestinal: Negative for abdominal distention, blood in stool, diarrhea and nausea.  Genitourinary: Negative for frequency and hematuria.  Musculoskeletal: Positive for arthralgias and gait problem. Negative for back pain, joint swelling and neck pain.  Skin: Negative for rash.  Neurological: Negative for dizziness, tremors, speech difficulty and weakness.  Psychiatric/Behavioral: Negative for agitation, dysphoric mood  and sleep disturbance. The patient is not nervous/anxious.     Objective:  BP 138/86 (BP Location: Right Arm, Patient Position: Sitting, Cuff Size: Large)   Pulse 62   Temp 97.8 F (36.6 C) (Oral)   Ht _0  (1.803 m)   Wt 247 lb (112 kg)   SpO2 98%   BMI 34.45 kg/m   BP Readings from Last 3 Encounters:  03/24/17 138/86  01/18/17 118/62  12/16/16 126/68    Wt Readings from Last 3 Encounters:  03/24/17 247 lb (112 kg)  01/18/17 242 lb 3.2 oz (109.9 kg)  12/16/16 239 lb (108.4 kg)    Physical Exam  Constitutional: He is oriented to person, place, and time. He appears well-developed. No distress.  NAD  HENT:  Mouth/Throat: Oropharynx is clear and moist.  Eyes: Conjunctivae are normal. Pupils are equal, round, and reactive to light.  Neck: Normal range of motion. No JVD present. No thyromegaly present.  Cardiovascular: Normal rate, regular rhythm, normal heart sounds and intact distal pulses. Exam reveals no gallop and no friction rub.  No murmur heard. Pulmonary/Chest: Effort normal and breath sounds normal. No respiratory distress. He has no wheezes. He has no rales. He exhibits no tenderness.  Abdominal: Soft. Bowel sounds are normal. He exhibits no distension and no mass. There is no tenderness. There is no rebound and no guarding.  Musculoskeletal: Normal range of motion. He exhibits no edema or tenderness.  Lymphadenopathy:    He has no cervical adenopathy.  Neurological: He is alert and oriented to person, place, and time. He has normal reflexes. No cranial nerve deficit. He exhibits normal muscle tone. He displays a negative Romberg sign. Coordination and gait normal.  Skin: Skin is warm and dry. No rash noted.  Psychiatric: He has a normal mood and affect. His behavior is normal. Judgment and thought content normal.    Lab Results  Component Value Date   WBC 5.8 07/20/2013   HGB 11.4 (L) 07/20/2013   HCT 35.3 (L) 07/20/2013   PLT 258 07/20/2013   GLUCOSE 211  (H) 09/09/2016   CHOL 167 09/09/2016   TRIG 371.0 (H) 09/09/2016   HDL 31.40 (L) 09/09/2016   LDLDIRECT 73.0 09/09/2016   LDLCALC 74 11/29/2013   ALT 17 09/09/2016   AST 13 09/09/2016   NA 139 09/09/2016   K 4.2 09/09/2016   CL 103 09/09/2016   CREATININE 1.14 09/09/2016   BUN 17 09/09/2016   CO2 29 09/09/2016   TSH 3.59 10/14/2010   PSA 0.99 09/09/2016   INR 1.15 01/21/2013   HGBA1C 6.5 01/18/2017   MICROALBUR <0.7 09/09/2016    Dg Chest 2 View  Result Date: 07/14/2015 CLINICAL DATA:  Shortness of breath and wheezing for 2 weeks EXAM: CHEST  2 VIEW COMPARISON:  07/20/2013 FINDINGS: Cardiac shadow is stable. Postsurgical changes are again seen. The lungs are well aerated bilaterally without evidence of focal infiltrate or sizable effusion. Moderate degenerative changes of thoracic spine are  noted. IMPRESSION: No active cardiopulmonary disease. Electronically Signed   By: Inez Catalina M.D.   On: 07/14/2015 08:25    Assessment & Plan:   There are no diagnoses linked to this encounter. I have discontinued Mylo Red. Aziz's valsartan-hydrochlorothiazide. I am also having him maintain his aspirin, cholecalciferol, ACCU-CHEK AVIVA PLUS, ACCU-CHEK AVIVA, B-D SINGLE USE SWABS REGULAR, glucose blood, accu-chek softclix, ACCU-CHEK SOFTCLIX LANCETS, VENTOLIN HFA, fluticasone furoate-vilanterol, promethazine, diazepam, meclizine, Multiple Vitamins-Minerals (MULTI-VITAMIN GUMMIES PO), tamsulosin, colchicine, nitroGLYCERIN, irbesartan-hydrochlorothiazide, metFORMIN, finasteride, pantoprazole, lovastatin, glimepiride, diclofenac, and bromocriptine.  No orders of the defined types were placed in this encounter.    Follow-up: No Follow-up on file.  Walker Kehr, MD

## 2017-03-24 NOTE — Assessment & Plan Note (Signed)
Losartan, NTG prn, Lovastatin, ASA 

## 2017-03-24 NOTE — Assessment & Plan Note (Signed)
Chronic Metformin, Glimepiride, Bromocriptine; Diovan  and Lovastatin

## 2017-03-24 NOTE — Assessment & Plan Note (Signed)
ASA

## 2017-04-21 ENCOUNTER — Encounter: Payer: Self-pay | Admitting: Endocrinology

## 2017-04-21 ENCOUNTER — Ambulatory Visit: Payer: Medicare HMO | Admitting: Endocrinology

## 2017-04-21 VITALS — BP 162/84 | HR 56 | Wt 242.2 lb

## 2017-04-21 DIAGNOSIS — E1159 Type 2 diabetes mellitus with other circulatory complications: Secondary | ICD-10-CM | POA: Diagnosis not present

## 2017-04-21 LAB — POCT GLYCOSYLATED HEMOGLOBIN (HGB A1C): Hemoglobin A1C: 6.7

## 2017-04-21 MED ORDER — GLIMEPIRIDE 1 MG PO TABS: 0.5000 mg | ORAL_TABLET | Freq: Every day | ORAL | 5 refills | Status: DC

## 2017-04-21 NOTE — Progress Notes (Signed)
Subjective:    Patient ID: Johnny [name], male    DOB: January 30, 1946, 72 y.o.   MRN: [id number]  HPI Pt returns for f/u of diabetes mellitus: DM type: 2 Dx'ed: 5631 Complications: CAD and renal insuff. Therapy: 3 oral meds DKA: never Severe hypoglycemia: never Pancreatitis: never Pancreatic imaging: never Other: he has never been on insulin, except in the hospital; edema and renal insuff limit rx options; he cannot afford name brand meds Interval history: He brings a record of his cbg's which I have reviewed today.  It varies from 90-161.  pt states he feels well in general.  Past Medical History:  Diagnosis Date  . Arthritis   . At risk for sleep apnea    STOP-BANG=  6   SENT TO PCP 05-21-2013  . Benign positional vertigo   . Benign prostatic hypertrophy   . Blood transfusion without reported diagnosis 2000  . CAD (coronary artery disease)    CARDIOLOGIST--  DR Cristopher Peru  . CHF (congestive heart failure) ([place])   . Chronic ischemic heart disease, unspecified   . ED (erectile dysfunction) of organic origin   . GERD (gastroesophageal reflux disease)   . History of CHF (congestive heart failure)    systolic  . HTN (hypertension)   . Hyperlipidemia   . Type 2 diabetes mellitus ([place])   . Wears glasses     Past Surgical History:  Procedure Laterality Date  . CARDIAC CATHETERIZATION  03-03-2000  dr gregg taylor   normal lvsf/  two-vessel cad significant complex stenosis at distal left main  . CARDIOVASCULAR STRESS TEST  08-09-2010  DR Carleene Overlie TAYLOR   normal lexiscan nuclear study/  no ischemia/  normal lvf/  ef 54%  . CATARACT EXTRACTION W/ INTRAOCULAR LENS IMPLANT Right 2011  . COLONOSCOPY  12-30-2009  . CORONARY ARTERY BYPASS GRAFT  03-06-2000   DR SHFWYOVZ   third vessel  . PENILE PROSTHESIS IMPLANT  02-08-2010   COLOPLAST 3-PIECE INFLATABLE  . PENILE PROSTHESIS IMPLANT N/A 05/27/2013   Procedure: CYSTO REMOVAL OF PENILE PROSTHESIS;  Surgeon: Claybon Jabs, MD;   Location: [facility];  Service: Urology;  Laterality: N/A;  . POLYPECTOMY  12-30-2009   +TA  . TOTAL HIP ARTHROPLASTY  01/24/2011   Procedure: TOTAL HIP ARTHROPLASTY;  Surgeon: Dione Plover [name];  Location: [facility];  Service: Orthopedics;  Laterality: Left;  . TOTAL HIP ARTHROPLASTY Right 01/30/2013   Procedure: RIGHT TOTAL HIP ARTHROPLASTY;  Surgeon: Gearlean Alf, MD;  Location: [facility];  Service: Orthopedics;  Laterality: Right;    Social History   Socioeconomic History  . Marital status: Married    Spouse name: Not on file  . Number of children: Not on file  . Years of education: Not on file  . Highest education level: Not on file  Social Needs  . Financial resource strain: Not on file  . Food insecurity - worry: Not on file  . Food insecurity - inability: Not on file  . Transportation needs - medical: Not on file  . Transportation needs - non-medical: Not on file  Occupational History  . Occupation: Engineer, building services: RETIRED  Tobacco Use  . Smoking status: Former Smoker    Types: Cigarettes    Last attempt to quit: 05/22/1970    Years since quitting: 46.9  . Smokeless tobacco: Never Used  Substance and Sexual Activity  . Alcohol use: No    Alcohol/week: 0.0 oz  . Drug use: No  .  Sexual activity: Not on file  Other Topics Concern  . Not on file  Social History Narrative   Regular Exercise-No          Current Outpatient Medications on File Prior to Visit  Medication Sig Dispense Refill  . ACCU-CHEK SOFTCLIX LANCETS lancets     . Alcohol Swabs (B-D SINGLE USE SWABS REGULAR) PADS 1 each by Does not apply route 2 (two) times daily as needed. Dx: 250.00 100 each 3  . aspirin 325 MG EC tablet Take 325 mg by mouth every morning.    . Blood Glucose Calibration (ACCU-CHEK AVIVA) SOLN Use as directed as needed. 1 each 2  . Blood Glucose Monitoring Suppl (ACCU-CHEK AVIVA PLUS) W/DEVICE KIT Use twice daily as directed. Dx: 250.00 1 kit 0  . bromocriptine  (PARLODEL) 2.5 MG tablet Take 0.5 tablets (1.25 mg total) by mouth daily. 30 tablet 11  . cholecalciferol (VITAMIN D) 1000 UNITS tablet Take 1,000 Units by mouth every morning.    . colchicine 0.6 MG tablet Take 1 tablet (0.6 mg total) by mouth 4 (four) times daily as needed (gout attack). 60 tablet 1  . diazepam (VALIUM) 2 MG tablet Take 1-2 tablets (2-4 mg total) by mouth every 6 (six) hours as needed. Prn vertigo 60 tablet 0  . diclofenac (VOLTAREN) 75 MG EC tablet Take 1 tablet (75 mg total) by mouth 2 (two) times daily as needed for moderate pain (gout attack). 30 tablet 2  . finasteride (PROSCAR) 5 MG tablet TAKE 1 TABLET EVERY MORNING 90 tablet 2  . fluticasone furoate-vilanterol (BREO ELLIPTA) 100-25 MCG/INH AEPB Inhale 1 puff into the lungs daily. 1 each 5  . glucose blood (ACCU-CHEK AVIVA) test strip Use twice daily as instructed. Dx: 250.00 100 each 3  . irbesartan-hydrochlorothiazide (AVALIDE) 300-12.5 MG tablet Take 1 tablet by mouth daily. 90 tablet 2  . Lancet Devices (ACCU-CHEK SOFTCLIX) lancets Use twice daily as instructed. Dx: 250.00 1 each 3  . lovastatin (MEVACOR) 40 MG tablet TAKE 1 TABLET EVERY MORNING 90 tablet 2  . metFORMIN (GLUCOPHAGE) 1000 MG tablet TAKE 1 TABLET TWICE DAILY WITH MEALS 180 tablet 2  . Multiple Vitamins-Minerals (MULTI-VITAMIN GUMMIES PO)     . nitroGLYCERIN (NITROSTAT) 0.4 MG SL tablet Place 1 tablet (0.4 mg total) under the tongue every 5 (five) minutes as needed for chest pain. 25 tablet 3  . pantoprazole (PROTONIX) 40 MG tablet TAKE 1 TABLET EVERY MORNING 90 tablet 2  . tamsulosin (FLOMAX) 0.4 MG CAPS capsule Take 1 capsule (0.4 mg total) by mouth daily. 90 capsule 3  . VENTOLIN HFA 108 (90 Base) MCG/ACT inhaler INHALE 2 PUFFS INTO THE LUNGS EVERY 4 HOURS AS NEEDED FOR WHEEZING OR SHORTNESS OF BREATH 18 g 5   No current facility-administered medications on file prior to visit.     Allergies  Allergen Reactions  . Enalapril     SOB  .  Hydrocodone-Acetaminophen Nausea Only    Can take codein    Family History  Problem Relation Age of Onset  . Hypertension Mother   . Diabetes Father   . Cancer - Other Brother        cancer all over  . Coronary artery disease Other        1st degree male relative  . Hypertension Other   . Cancer Sister 17       ovarian ca  . Colon cancer Neg Hx     BP (!) 162/84 (BP Location: Right Arm,  Patient Position: Sitting, Cuff Size: Normal)   Pulse (!) 56   Wt 242 lb 3.2 oz (109.9 kg)   SpO2 97%   BMI 33.78 kg/m    Review of Systems He denies hypoglycemia.      Objective:   Physical Exam VITAL SIGNS:  See vs page GENERAL: no distress Pulses: foot pulses are intact bilaterally.   MSK: no deformity of the feet or ankles.  CV: 1+ bilat edema of the legs or ankles Skin:  no ulcer on the feet or ankles.  normal color and temp on the feet and ankles Neuro: sensation is intact to touch on the feet and ankles.   Lab Results  Component Value Date   HGBA1C 6.7 04/21/2017   Lab Results  Component Value Date   CREATININE 1.14 09/09/2016   BUN 17 09/09/2016   NA 139 09/09/2016   K 4.2 09/09/2016   CL 103 09/09/2016   CO2 29 09/09/2016      Assessment & Plan:  Type 2 DM: overcontrolled, for this SU-containing regimen  Patient Instructions  check your blood sugar once a day.  vary the time of day when you check, between before the 3 meals, and at bedtime.  also check if you have symptoms of your blood sugar being too high or too low.  please keep a record of the readings and bring it to your next appointment here (or you can bring the meter itself).  You can write it on any piece of paper.  please call us sooner if your blood sugar goes below 70, or if you have a lot of readings over 200.  I have sent prescriptions to your pharmacy, to reduce the glimepiride to 1/2 pill each morning daily.  Our goal is to phase this out.  Please continue the same metformin and bromocriptine.    Please come back for a follow-up appointment in 3 months.

## 2017-04-21 NOTE — Patient Instructions (Addendum)
check your blood sugar once a day.  vary the time of day when you check, between before the 3 meals, and at bedtime.  also check if you have symptoms of your blood sugar being too high or too low.  please keep a record of the readings and bring it to your next appointment here (or you can bring the meter itself).  You can write it on any piece of paper.  please call us sooner if your blood sugar goes below 70, or if you have a lot of readings over 200.  I have sent prescriptions to your pharmacy, to reduce the glimepiride to 1/2 pill each morning daily.  Our goal is to phase this out.  Please continue the same metformin and bromocriptine.  Please come back for a follow-up appointment in 3 months.

## 2017-05-15 ENCOUNTER — Other Ambulatory Visit: Payer: Self-pay | Admitting: Internal Medicine

## 2017-06-09 ENCOUNTER — Telehealth: Payer: Self-pay | Admitting: Internal Medicine

## 2017-06-16 ENCOUNTER — Encounter: Payer: Self-pay | Admitting: Internal Medicine

## 2017-06-16 ENCOUNTER — Ambulatory Visit: Payer: Medicare HMO | Admitting: Internal Medicine

## 2017-06-16 ENCOUNTER — Other Ambulatory Visit (INDEPENDENT_AMBULATORY_CARE_PROVIDER_SITE_OTHER): Payer: Medicare PPO

## 2017-06-16 DIAGNOSIS — E1159 Type 2 diabetes mellitus with other circulatory complications: Secondary | ICD-10-CM | POA: Diagnosis not present

## 2017-06-16 DIAGNOSIS — I259 Chronic ischemic heart disease, unspecified: Secondary | ICD-10-CM

## 2017-06-16 DIAGNOSIS — E785 Hyperlipidemia, unspecified: Secondary | ICD-10-CM | POA: Diagnosis not present

## 2017-06-16 DIAGNOSIS — M10079 Idiopathic gout, unspecified ankle and foot: Secondary | ICD-10-CM

## 2017-06-16 DIAGNOSIS — E119 Type 2 diabetes mellitus without complications: Secondary | ICD-10-CM

## 2017-06-16 LAB — HEMOGLOBIN A1C: HEMOGLOBIN A1C: 7.4 % — AB (ref 4.6–6.5)

## 2017-06-16 LAB — BASIC METABOLIC PANEL
BUN: 15 mg/dL (ref 6–23)
CHLORIDE: 103 meq/L (ref 96–112)
CO2: 29 mEq/L (ref 19–32)
Calcium: 9.4 mg/dL (ref 8.4–10.5)
Creatinine, Ser: 1.28 mg/dL (ref 0.40–1.50)
GFR: 70.91 mL/min (ref >60.00)
Glucose, Bld: 98 mg/dL (ref 70–99)
POTASSIUM: 3.8 meq/L (ref 3.5–5.1)
SODIUM: 140 meq/L (ref 135–145)

## 2017-06-16 MED ORDER — DICLOFENAC SODIUM 75 MG PO TBEC: DELAYED_RELEASE_TABLET | ORAL | 1 refills | Status: DC

## 2017-06-16 MED ORDER — METHYLPREDNISOLONE ACETATE 80 MG/ML IJ SUSP
80.0000 mg | Freq: Once | INTRAMUSCULAR | Status: AC
  Administered 2017-06-16: 80 mg via INTRAMUSCULAR

## 2017-06-16 MED ORDER — DICLOFENAC SODIUM 75 MG PO TBEC: DELAYED_RELEASE_TABLET | ORAL | 0 refills | Status: DC

## 2017-06-16 NOTE — Telephone Encounter (Signed)
Pharmacy states they did not send this and it was not sent for a 90 day supply.  Please advise and resend  Pharmacy confirmed

## 2017-06-16 NOTE — Assessment & Plan Note (Signed)
Lovastatin 

## 2017-06-16 NOTE — Assessment & Plan Note (Addendum)
Diclofenac and colchicine prn Depo-medrol 80 mg IM

## 2017-06-16 NOTE — Assessment & Plan Note (Signed)
Metformin, Glimepiride, Bromocriptine; Diovan  and Lovastatin 

## 2017-06-16 NOTE — Telephone Encounter (Signed)
Resent for 90 day supply.Marland KitchenJohny [name]

## 2017-06-16 NOTE — Progress Notes (Signed)
Subjective:  Patient ID: Johnny [name], male    DOB: 02/08/1946  Age: 72 y.o. MRN: [id number]  CC: No chief complaint on file.   HPI Johnny [name] presents for gout, DM, CAD, HTN f/u  Outpatient Medications Prior to Visit  Medication Sig Dispense Refill  . ACCU-CHEK SOFTCLIX LANCETS lancets     . Alcohol Swabs (B-D SINGLE USE SWABS REGULAR) PADS 1 each by Does not apply route 2 (two) times daily as needed. Dx: 250.00 100 each 3  . aspirin 325 MG EC tablet Take 325 mg by mouth every morning.    . Blood Glucose Calibration (ACCU-CHEK AVIVA) SOLN Use as directed as needed. 1 each 2  . Blood Glucose Monitoring Suppl (ACCU-CHEK AVIVA PLUS) W/DEVICE KIT Use twice daily as directed. Dx: 250.00 1 kit 0  . bromocriptine (PARLODEL) 2.5 MG tablet Take 0.5 tablets (1.25 mg total) by mouth daily. 30 tablet 11  . cholecalciferol (VITAMIN D) 1000 UNITS tablet Take 1,000 Units by mouth every morning.    . colchicine 0.6 MG tablet Take 1 tablet (0.6 mg total) by mouth 4 (four) times daily as needed (gout attack). 60 tablet 1  . diazepam (VALIUM) 2 MG tablet Take 1-2 tablets (2-4 mg total) by mouth every 6 (six) hours as needed. Prn vertigo 60 tablet 0  . diclofenac (VOLTAREN) 75 MG EC tablet TAKE 1 TABLET BY MOUTH TWICE DAILY AS NEEDED FOR MODERATE PAIN (GOUT  ATTACK) 90 tablet 0  . finasteride (PROSCAR) 5 MG tablet TAKE 1 TABLET EVERY MORNING 90 tablet 2  . fluticasone furoate-vilanterol (BREO ELLIPTA) 100-25 MCG/INH AEPB Inhale 1 puff into the lungs daily. 1 each 5  . glimepiride (AMARYL) 1 MG tablet Take 0.5 tablets (0.5 mg total) by mouth daily with breakfast. 30 tablet 5  . glucose blood (ACCU-CHEK AVIVA) test strip Use twice daily as instructed. Dx: 250.00 100 each 3  . irbesartan-hydrochlorothiazide (AVALIDE) 300-12.5 MG tablet Take 1 tablet by mouth daily. 90 tablet 2  . Lancet Devices (ACCU-CHEK SOFTCLIX) lancets Use twice daily as instructed. Dx: 250.00 1 each 3  . lovastatin (MEVACOR) 40  MG tablet TAKE 1 TABLET EVERY MORNING 90 tablet 2  . metFORMIN (GLUCOPHAGE) 1000 MG tablet TAKE 1 TABLET TWICE DAILY WITH MEALS 180 tablet 2  . Multiple Vitamins-Minerals (MULTI-VITAMIN GUMMIES PO)     . nitroGLYCERIN (NITROSTAT) 0.4 MG SL tablet Place 1 tablet (0.4 mg total) under the tongue every 5 (five) minutes as needed for chest pain. 25 tablet 3  . pantoprazole (PROTONIX) 40 MG tablet TAKE 1 TABLET EVERY MORNING 90 tablet 2  . tamsulosin (FLOMAX) 0.4 MG CAPS capsule TAKE 1 CAPSULE (0.4 MG TOTAL) BY MOUTH DAILY. 90 capsule 3  . VENTOLIN HFA 108 (90 Base) MCG/ACT inhaler INHALE 2 PUFFS INTO THE LUNGS EVERY 4 HOURS AS NEEDED FOR WHEEZING OR SHORTNESS OF BREATH 18 g 5   No facility-administered medications prior to visit.     ROS Review of Systems  Constitutional: Negative for appetite change, fatigue and unexpected weight change.  HENT: Negative for congestion, nosebleeds, sneezing, sore throat and trouble swallowing.   Eyes: Negative for itching and visual disturbance.  Respiratory: Negative for cough.   Cardiovascular: Negative for chest pain, palpitations and leg swelling.  Gastrointestinal: Negative for abdominal distention, blood in stool, diarrhea and nausea.  Genitourinary: Negative for frequency and hematuria.  Musculoskeletal: Positive for arthralgias, back pain and gait problem. Negative for joint swelling and neck pain.  Skin: Negative for  rash.  Neurological: Negative for dizziness, tremors, speech difficulty and weakness.  Psychiatric/Behavioral: Negative for agitation, dysphoric mood and sleep disturbance. The patient is not nervous/anxious.     Objective:  BP 132/60 (BP Location: Left Arm, Patient Position: Sitting, Cuff Size: Large)   Pulse 66   Temp 98 F (36.7 C) (Oral)   Ht '5\' 11"'$  (1.803 m)   Wt 242 lb (109.8 kg)   SpO2 99%   BMI 33.75 kg/m   BP Readings from Last 3 Encounters:  06/16/17 132/60  04/21/17 (!) 162/84  03/24/17 138/86    Wt Readings from  Last 3 Encounters:  06/16/17 242 lb (109.8 kg)  04/21/17 242 lb 3.2 oz (109.9 kg)  03/24/17 247 lb (112 kg)    Physical Exam  Constitutional: He is oriented to person, place, and time. He appears well-developed. No distress.  NAD  HENT:  Mouth/Throat: Oropharynx is clear and moist.  Eyes: Pupils are equal, round, and reactive to light. Conjunctivae are normal.  Neck: Normal range of motion. No JVD present. No thyromegaly present.  Cardiovascular: Normal rate, regular rhythm, normal heart sounds and intact distal pulses. Exam reveals no gallop and no friction rub.  No murmur heard. Pulmonary/Chest: Effort normal and breath sounds normal. No respiratory distress. He has no wheezes. He has no rales. He exhibits no tenderness.  Abdominal: Soft. Bowel sounds are normal. He exhibits no distension and no mass. There is no tenderness. There is no rebound and no guarding.  Musculoskeletal: Normal range of motion. He exhibits tenderness. He exhibits no edema.  Lymphadenopathy:    He has no cervical adenopathy.  Neurological: He is alert and oriented to person, place, and time. He has normal reflexes. No cranial nerve deficit. He exhibits normal muscle tone. He displays a negative Romberg sign. Coordination abnormal. Gait normal.  Skin: Skin is warm and dry. No rash noted.  Psychiatric: He has a normal mood and affect. His behavior is normal. Judgment and thought content normal.  Cane LS tender L ankle - painful  Lab Results  Component Value Date   WBC 5.8 07/20/2013   HGB 11.4 (L) 07/20/2013   HCT 35.3 (L) 07/20/2013   PLT 258 07/20/2013   GLUCOSE 211 (H) 09/09/2016   CHOL 167 09/09/2016   TRIG 371.0 (H) 09/09/2016   HDL 31.40 (L) 09/09/2016   LDLDIRECT 73.0 09/09/2016   LDLCALC 74 11/29/2013   ALT 17 09/09/2016   AST 13 09/09/2016   NA 139 09/09/2016   K 4.2 09/09/2016   CL 103 09/09/2016   CREATININE 1.14 09/09/2016   BUN 17 09/09/2016   CO2 29 09/09/2016   TSH 3.59 10/14/2010     PSA 0.99 09/09/2016   INR 1.15 01/21/2013   HGBA1C 6.7 04/21/2017   MICROALBUR <0.7 09/09/2016    Dg Chest 2 View  Result Date: 07/14/2015 CLINICAL DATA:  Shortness of breath and wheezing for 2 weeks EXAM: CHEST  2 VIEW COMPARISON:  07/20/2013 FINDINGS: Cardiac shadow is stable. Postsurgical changes are again seen. The lungs are well aerated bilaterally without evidence of focal infiltrate or sizable effusion. Moderate degenerative changes of thoracic spine are noted. IMPRESSION: No active cardiopulmonary disease. Electronically Signed   By: Inez Catalina M.D.   On: 07/14/2015 08:25    Assessment & Plan:   There are no diagnoses linked to this encounter. I am having Mylo Red. Bickert maintain his aspirin, cholecalciferol, ACCU-CHEK AVIVA PLUS, ACCU-CHEK AVIVA, B-D SINGLE USE SWABS REGULAR, glucose blood, accu-chek softclix, ACCU-CHEK  SOFTCLIX LANCETS, VENTOLIN HFA, fluticasone furoate-vilanterol, diazepam, Multiple Vitamins-Minerals (MULTI-VITAMIN GUMMIES PO), colchicine, nitroGLYCERIN, irbesartan-hydrochlorothiazide, metFORMIN, finasteride, pantoprazole, lovastatin, bromocriptine, glimepiride, tamsulosin, and diclofenac.  No orders of the defined types were placed in this encounter.    Follow-up: No follow-ups on file.  Walker Kehr, MD

## 2017-06-16 NOTE — Addendum Note (Signed)
Addended by: Karren Cobble on: 06/16/2017 02:55 PM   Modules accepted: Orders

## 2017-06-16 NOTE — Addendum Note (Signed)
Addended by: Earnstine Regal on: 06/16/2017 11:08 AM   Modules accepted: Orders

## 2017-07-20 ENCOUNTER — Other Ambulatory Visit: Payer: Self-pay | Admitting: Internal Medicine

## 2017-07-21 ENCOUNTER — Encounter: Payer: Self-pay | Admitting: Endocrinology

## 2017-07-21 ENCOUNTER — Ambulatory Visit (INDEPENDENT_AMBULATORY_CARE_PROVIDER_SITE_OTHER): Payer: Medicare PPO | Admitting: Endocrinology

## 2017-07-21 VITALS — BP 122/60 | HR 60 | Wt 240.4 lb

## 2017-07-21 DIAGNOSIS — E1159 Type 2 diabetes mellitus with other circulatory complications: Secondary | ICD-10-CM

## 2017-07-21 MED ORDER — BROMOCRIPTINE MESYLATE 2.5 MG PO TABS: 1.2500 mg | ORAL_TABLET | Freq: Every day | ORAL | 11 refills | Status: DC

## 2017-07-21 NOTE — Patient Instructions (Addendum)
check your blood sugar once a day.  vary the time of day when you check, between before the 3 meals, and at bedtime.  also check if you have symptoms of your blood sugar being too high or too low.  please keep a record of the readings and bring it to your next appointment here (or you can bring the meter itself).  You can write it on any piece of paper.  please call us sooner if your blood sugar goes below 70, or if you have a lot of readings over 200.  I have sent prescriptions to your pharmacy, to increase the bromocriptine to 1/2 pill daily.   Please continue the same metformin and glimepiride.  Please come back for a follow-up appointment in 3 months.

## 2017-07-21 NOTE — Progress Notes (Signed)
Subjective:    Patient ID: Johnny [name], male    DOB: 10-14-1945, 72 y.o.   MRN: [id number]  HPI Pt returns for f/u of diabetes mellitus:  DM type: 2 Dx'ed: 2595 Complications: CAD and renal insuff.   Therapy: 3 oral meds.  DKA: never Severe hypoglycemia: never Pancreatitis: never Pancreatic imaging: never Other: he has never been on insulin, except in the hospital; edema and renal insuff limit rx options; he cannot afford name brand meds.   Interval history: no cbg record, but states cbg's are in the low-100's.  pt states he feels well in general.  He takes parlodel, just 1/2 tab per day.  Past Medical History:  Diagnosis Date  . Arthritis   . At risk for sleep apnea    STOP-BANG=  6   SENT TO PCP 05-21-2013  . Benign positional vertigo   . Benign prostatic hypertrophy   . Blood transfusion without reported diagnosis 2000  . CAD (coronary artery disease)    CARDIOLOGIST--  DR Cristopher Peru  . CHF (congestive heart failure) ([place])   . Chronic ischemic heart disease, unspecified   . ED (erectile dysfunction) of organic origin   . GERD (gastroesophageal reflux disease)   . History of CHF (congestive heart failure)    systolic  . HTN (hypertension)   . Hyperlipidemia   . Type 2 diabetes mellitus ([place])   . Wears glasses     Past Surgical History:  Procedure Laterality Date  . CARDIAC CATHETERIZATION  03-03-2000  dr gregg taylor   normal lvsf/  two-vessel cad significant complex stenosis at distal left main  . CARDIOVASCULAR STRESS TEST  08-09-2010  DR Carleene Overlie TAYLOR   normal lexiscan nuclear study/  no ischemia/  normal lvf/  ef 54%  . CATARACT EXTRACTION W/ INTRAOCULAR LENS IMPLANT Right 2011  . COLONOSCOPY  12-30-2009  . CORONARY ARTERY BYPASS GRAFT  03-06-2000   DR GLOVFIEP   third vessel  . PENILE PROSTHESIS IMPLANT  02-08-2010   COLOPLAST 3-PIECE INFLATABLE  . PENILE PROSTHESIS IMPLANT N/A 05/27/2013   Procedure: CYSTO REMOVAL OF PENILE PROSTHESIS;  Surgeon: Claybon Jabs, MD;  Location: [facility];  Service: Urology;  Laterality: N/A;  . POLYPECTOMY  12-30-2009   +TA  . TOTAL HIP ARTHROPLASTY  01/24/2011   Procedure: TOTAL HIP ARTHROPLASTY;  Surgeon: Dione Plover [name];  Location: [facility];  Service: Orthopedics;  Laterality: Left;  . TOTAL HIP ARTHROPLASTY Right 01/30/2013   Procedure: RIGHT TOTAL HIP ARTHROPLASTY;  Surgeon: Gearlean Alf, MD;  Location: [facility];  Service: Orthopedics;  Laterality: Right;    Social History   Socioeconomic History  . Marital status: Married    Spouse name: Not on file  . Number of children: Not on file  . Years of education: Not on file  . Highest education level: Not on file  Occupational History  . Occupation: Engineer, building services: RETIRED  Social Needs  . Financial resource strain: Not on file  . Food insecurity:    Worry: Not on file    Inability: Not on file  . Transportation needs:    Medical: Not on file    Non-medical: Not on file  Tobacco Use  . Smoking status: Former Smoker    Types: Cigarettes    Last attempt to quit: 05/22/1970    Years since quitting: 47.2  . Smokeless tobacco: Never Used  Substance and Sexual Activity  . Alcohol use: No  Alcohol/week: 0.0 oz  . Drug use: No  . Sexual activity: Not on file  Lifestyle  . Physical activity:    Days per week: Not on file    Minutes per session: Not on file  . Stress: Not on file  Relationships  . Social connections:    Talks on phone: Not on file    Gets together: Not on file    Attends religious service: Not on file    Active member of club or organization: Not on file    Attends meetings of clubs or organizations: Not on file    Relationship status: Not on file  . Intimate partner violence:    Fear of current or ex partner: Not on file    Emotionally abused: Not on file    Physically abused: Not on file    Forced sexual activity: Not on file  Other Topics Concern  . Not on file  Social History Narrative    Regular Exercise-No          Current Outpatient Medications on File Prior to Visit  Medication Sig Dispense Refill  . ACCU-CHEK SOFTCLIX LANCETS lancets     . Alcohol Swabs (B-D SINGLE USE SWABS REGULAR) PADS 1 each by Does not apply route 2 (two) times daily as needed. Dx: 250.00 100 each 3  . aspirin 325 MG EC tablet Take 325 mg by mouth every morning.    . Blood Glucose Calibration (ACCU-CHEK AVIVA) SOLN Use as directed as needed. 1 each 2  . Blood Glucose Monitoring Suppl (ACCU-CHEK AVIVA PLUS) W/DEVICE KIT Use twice daily as directed. Dx: 250.00 1 kit 0  . cholecalciferol (VITAMIN D) 1000 UNITS tablet Take 1,000 Units by mouth every morning.    . colchicine 0.6 MG tablet Take 1 tablet (0.6 mg total) by mouth 4 (four) times daily as needed (gout attack). 60 tablet 1  . diazepam (VALIUM) 2 MG tablet Take 1-2 tablets (2-4 mg total) by mouth every 6 (six) hours as needed. Prn vertigo 60 tablet 0  . diclofenac (VOLTAREN) 75 MG EC tablet TAKE 1 TABLET BY MOUTH TWICE DAILY AS NEEDED FOR MODERATE PAIN (GOUT  ATTACK) 90 tablet 1  . finasteride (PROSCAR) 5 MG tablet TAKE 1 TABLET EVERY MORNING 90 tablet 2  . fluticasone furoate-vilanterol (BREO ELLIPTA) 100-25 MCG/INH AEPB Inhale 1 puff into the lungs daily. 1 each 5  . glimepiride (AMARYL) 1 MG tablet Take 0.5 tablets (0.5 mg total) by mouth daily with breakfast. 30 tablet 5  . glucose blood (ACCU-CHEK AVIVA) test strip Use twice daily as instructed. Dx: 250.00 100 each 3  . irbesartan-hydrochlorothiazide (AVALIDE) 300-12.5 MG tablet TAKE 1 TABLET EVERY DAY 90 tablet 2  . Lancet Devices (ACCU-CHEK SOFTCLIX) lancets Use twice daily as instructed. Dx: 250.00 1 each 3  . lovastatin (MEVACOR) 40 MG tablet TAKE 1 TABLET EVERY MORNING 90 tablet 2  . metFORMIN (GLUCOPHAGE) 1000 MG tablet TAKE 1 TABLET TWICE DAILY WITH MEALS 180 tablet 2  . Multiple Vitamins-Minerals (MULTI-VITAMIN GUMMIES PO)     . nitroGLYCERIN (NITROSTAT) 0.4 MG SL tablet Place 1  tablet (0.4 mg total) under the tongue every 5 (five) minutes as needed for chest pain. 25 tablet 3  . pantoprazole (PROTONIX) 40 MG tablet TAKE 1 TABLET EVERY MORNING 90 tablet 2  . tamsulosin (FLOMAX) 0.4 MG CAPS capsule TAKE 1 CAPSULE (0.4 MG TOTAL) BY MOUTH DAILY. 90 capsule 3  . VENTOLIN HFA 108 (90 Base) MCG/ACT inhaler INHALE 2 PUFFS INTO  THE LUNGS EVERY 4 HOURS AS NEEDED FOR WHEEZING OR SHORTNESS OF BREATH 18 g 5   No current facility-administered medications on file prior to visit.     Allergies  Allergen Reactions  . Enalapril     SOB  . Hydrocodone-Acetaminophen Nausea Only    Can take codein    Family History  Problem Relation Age of Onset  . Hypertension Mother   . Diabetes Father   . Cancer - Other Brother        cancer all over  . Coronary artery disease Other        1st degree male relative  . Hypertension Other   . Cancer Sister 19       ovarian ca  . Colon cancer Neg Hx     BP 122/60   Pulse 60   Wt 240 lb 6.4 oz (109 kg)   SpO2 97%   BMI 33.53 kg/m    Review of Systems He denies hypoglycemia.      Objective:   Physical Exam VITAL SIGNS:  See vs page GENERAL: no distress Pulses: foot pulses are intact bilaterally.   MSK: no deformity of the feet or ankles.  CV: trace bilat edema of the legs or ankles Skin:  no ulcer on the feet or ankles.  normal color and temp on the feet and ankles Neuro: sensation is intact to touch on the feet and ankles.  Ext: There is bilateral onychomycosis of the toenails.    Lab Results  Component Value Date   HGBA1C 7.4 (H) 06/16/2017   Lab Results  Component Value Date   CREATININE 1.28 06/16/2017   BUN 15 06/16/2017   NA 140 06/16/2017   K 3.8 06/16/2017   CL 103 06/16/2017   CO2 29 06/16/2017        Assessment & Plan:  Type 2 DM, with CAD: worse Renal insuff: we need to minimize metformin and glimepiride.  We'll reduce when we can.  Patient Instructions  check your blood sugar once a day.  vary  the time of day when you check, between before the 3 meals, and at bedtime.  also check if you have symptoms of your blood sugar being too high or too low.  please keep a record of the readings and bring it to your next appointment here (or you can bring the meter itself).  You can write it on any piece of paper.  please call us sooner if your blood sugar goes below 70, or if you have a lot of readings over 200.  I have sent prescriptions to your pharmacy, to increase the bromocriptine to 1/2 pill daily.   Please continue the same metformin and glimepiride.  Please come back for a follow-up appointment in 3 months.

## 2017-09-22 ENCOUNTER — Ambulatory Visit: Payer: Medicare PPO | Admitting: Internal Medicine

## 2017-09-22 ENCOUNTER — Encounter: Payer: Self-pay | Admitting: Internal Medicine

## 2017-09-22 VITALS — BP 142/58 | HR 68 | Temp 98.5°F | Ht 71.0 in | Wt 240.0 lb

## 2017-09-22 DIAGNOSIS — I10 Essential (primary) hypertension: Secondary | ICD-10-CM

## 2017-09-22 DIAGNOSIS — N320 Bladder-neck obstruction: Secondary | ICD-10-CM

## 2017-09-22 DIAGNOSIS — E785 Hyperlipidemia, unspecified: Secondary | ICD-10-CM

## 2017-09-22 DIAGNOSIS — I259 Chronic ischemic heart disease, unspecified: Secondary | ICD-10-CM

## 2017-09-22 DIAGNOSIS — E1159 Type 2 diabetes mellitus with other circulatory complications: Secondary | ICD-10-CM | POA: Diagnosis not present

## 2017-09-22 MED ORDER — GLUCOSE BLOOD VI STRP: ORAL_STRIP | 11 refills | Status: DC

## 2017-09-22 NOTE — Assessment & Plan Note (Signed)
Wt Readings from Last 3 Encounters:  09/22/17 240 lb (108.9 kg)  07/21/17 240 lb 6.4 oz (109 kg)  06/16/17 242 lb (109.8 kg)

## 2017-09-22 NOTE — Assessment & Plan Note (Signed)
ASA

## 2017-09-22 NOTE — Assessment & Plan Note (Signed)
Metformin, Glimepiride, Bromocriptine; Diovan  and Lovastatin

## 2017-09-22 NOTE — Assessment & Plan Note (Signed)
Irbesartan HCT 

## 2017-09-22 NOTE — Assessment & Plan Note (Signed)
PSA

## 2017-09-22 NOTE — Progress Notes (Signed)
Subjective:  Patient ID: Johnny [name], male    DOB: 05/18/1945  Age: 72 y.o. MRN: [id number]  CC: No chief complaint on file.   HPI [name] presents for dizziness, heat exposure yesterday after 2 pm F/u DM, CAD, HTN   Outpatient Medications Prior to Visit  Medication Sig Dispense Refill  . ACCU-CHEK SOFTCLIX LANCETS lancets     . Alcohol Swabs (B-D SINGLE USE SWABS REGULAR) PADS 1 each by Does not apply route 2 (two) times daily as needed. Dx: 250.00 100 each 3  . aspirin 325 MG EC tablet Take 325 mg by mouth every morning.    . Blood Glucose Calibration (ACCU-CHEK AVIVA) SOLN Use as directed as needed. 1 each 2  . Blood Glucose Monitoring Suppl (ACCU-CHEK AVIVA PLUS) W/DEVICE KIT Use twice daily as directed. Dx: 250.00 1 kit 0  . bromocriptine (PARLODEL) 2.5 MG tablet Take 0.5 tablets (1.25 mg total) by mouth daily. 30 tablet 11  . cholecalciferol (VITAMIN D) 1000 UNITS tablet Take 1,000 Units by mouth every morning.    . colchicine 0.6 MG tablet Take 1 tablet (0.6 mg total) by mouth 4 (four) times daily as needed (gout attack). 60 tablet 1  . diazepam (VALIUM) 2 MG tablet Take 1-2 tablets (2-4 mg total) by mouth every 6 (six) hours as needed. Prn vertigo 60 tablet 0  . diclofenac (VOLTAREN) 75 MG EC tablet TAKE 1 TABLET BY MOUTH TWICE DAILY AS NEEDED FOR MODERATE PAIN (GOUT  ATTACK) 90 tablet 1  . finasteride (PROSCAR) 5 MG tablet TAKE 1 TABLET EVERY MORNING 90 tablet 2  . fluticasone furoate-vilanterol (BREO ELLIPTA) 100-25 MCG/INH AEPB Inhale 1 puff into the lungs daily. 1 each 5  . glimepiride (AMARYL) 1 MG tablet Take 0.5 tablets (0.5 mg total) by mouth daily with breakfast. 30 tablet 5  . glucose blood (ACCU-CHEK AVIVA) test strip Use twice daily as instructed. Dx: 250.00 100 each 3  . irbesartan-hydrochlorothiazide (AVALIDE) 300-12.5 MG tablet TAKE 1 TABLET EVERY DAY 90 tablet 2  . Lancet Devices (ACCU-CHEK SOFTCLIX) lancets Use twice daily as instructed. Dx: 250.00 1  each 3  . lovastatin (MEVACOR) 40 MG tablet TAKE 1 TABLET EVERY MORNING 90 tablet 2  . metFORMIN (GLUCOPHAGE) 1000 MG tablet TAKE 1 TABLET TWICE DAILY WITH MEALS 180 tablet 2  . Multiple Vitamins-Minerals (MULTI-VITAMIN GUMMIES PO)     . nitroGLYCERIN (NITROSTAT) 0.4 MG SL tablet Place 1 tablet (0.4 mg total) under the tongue every 5 (five) minutes as needed for chest pain. 25 tablet 3  . pantoprazole (PROTONIX) 40 MG tablet TAKE 1 TABLET EVERY MORNING 90 tablet 2  . tamsulosin (FLOMAX) 0.4 MG CAPS capsule TAKE 1 CAPSULE (0.4 MG TOTAL) BY MOUTH DAILY. 90 capsule 3  . VENTOLIN HFA 108 (90 Base) MCG/ACT inhaler INHALE 2 PUFFS INTO THE LUNGS EVERY 4 HOURS AS NEEDED FOR WHEEZING OR SHORTNESS OF BREATH 18 g 5   No facility-administered medications prior to visit.     ROS: Review of Systems  Constitutional: Positive for fatigue. Negative for appetite change and unexpected weight change.  HENT: Negative for congestion, nosebleeds, sneezing, sore throat and trouble swallowing.   Eyes: Negative for itching and visual disturbance.  Respiratory: Negative for cough.   Cardiovascular: Negative for chest pain, palpitations and leg swelling.  Gastrointestinal: Negative for abdominal distention, blood in stool, diarrhea and nausea.  Genitourinary: Negative for frequency and hematuria.  Musculoskeletal: Positive for back pain and gait problem. Negative for joint swelling  and neck pain.  Skin: Negative for rash.  Neurological: Positive for light-headedness. Negative for dizziness, tremors, speech difficulty and weakness.  Psychiatric/Behavioral: Negative for agitation, dysphoric mood and sleep disturbance. The patient is not nervous/anxious.     Objective:  BP (!) 142/58 (BP Location: Right Arm, Patient Position: Sitting, Cuff Size: Large)   Pulse 68   Temp 98.5 F (36.9 C) (Oral)   Ht '5\' 11"'$  (1.803 m)   Wt 240 lb (108.9 kg)   SpO2 98%   BMI 33.47 kg/m   BP Readings from Last 3 Encounters:    09/22/17 (!) 142/58  07/21/17 122/60  06/16/17 132/60    Wt Readings from Last 3 Encounters:  09/22/17 240 lb (108.9 kg)  07/21/17 240 lb 6.4 oz (109 kg)  06/16/17 242 lb (109.8 kg)    Physical Exam  Constitutional: He is oriented to person, place, and time. He appears well-developed. No distress.  NAD  HENT:  Mouth/Throat: Oropharynx is clear and moist.  Eyes: Pupils are equal, round, and reactive to light. Conjunctivae are normal.  Neck: Normal range of motion. No JVD present. No thyromegaly present.  Cardiovascular: Normal rate, regular rhythm, normal heart sounds and intact distal pulses. Exam reveals no gallop and no friction rub.  No murmur heard. Pulmonary/Chest: Effort normal and breath sounds normal. No respiratory distress. He has no wheezes. He has no rales. He exhibits no tenderness.  Abdominal: Soft. Bowel sounds are normal. He exhibits no distension and no mass. There is no tenderness. There is no rebound and no guarding.  Genitourinary: Rectum normal and prostate normal. Rectal exam shows guaiac negative stool.  Musculoskeletal: Normal range of motion. He exhibits no edema or tenderness.  Lymphadenopathy:    He has no cervical adenopathy.  Neurological: He is alert and oriented to person, place, and time. He has normal reflexes. No cranial nerve deficit. He exhibits normal muscle tone. He displays a negative Romberg sign. Coordination and gait normal.  Skin: Skin is warm and dry. No rash noted.  Psychiatric: He has a normal mood and affect. His behavior is normal. Judgment and thought content normal.    Lab Results  Component Value Date   WBC 5.8 07/20/2013   HGB 11.4 (L) 07/20/2013   HCT 35.3 (L) 07/20/2013   PLT 258 07/20/2013   GLUCOSE 98 06/16/2017   CHOL 167 09/09/2016   TRIG 371.0 (H) 09/09/2016   HDL 31.40 (L) 09/09/2016   LDLDIRECT 73.0 09/09/2016   LDLCALC 74 11/29/2013   ALT 17 09/09/2016   AST 13 09/09/2016   NA 140 06/16/2017   K 3.8  06/16/2017   CL 103 06/16/2017   CREATININE 1.28 06/16/2017   BUN 15 06/16/2017   CO2 29 06/16/2017   TSH 3.59 10/14/2010   PSA 0.99 09/09/2016   INR 1.15 01/21/2013   HGBA1C 7.4 (H) 06/16/2017   MICROALBUR <0.7 09/09/2016    Dg Chest 2 View  Result Date: 07/14/2015 CLINICAL DATA:  Shortness of breath and wheezing for 2 weeks EXAM: CHEST  2 VIEW COMPARISON:  07/20/2013 FINDINGS: Cardiac shadow is stable. Postsurgical changes are again seen. The lungs are well aerated bilaterally without evidence of focal infiltrate or sizable effusion. Moderate degenerative changes of thoracic spine are noted. IMPRESSION: No active cardiopulmonary disease. Electronically Signed   By: Inez Catalina M.D.   On: 07/14/2015 08:25    Assessment & Plan:   There are no diagnoses linked to this encounter.   No orders of the defined types  were placed in this encounter.    Follow-up: No follow-ups on file.  Walker Kehr, MD

## 2017-09-28 ENCOUNTER — Telehealth: Payer: Self-pay | Admitting: Internal Medicine

## 2017-09-28 NOTE — Telephone Encounter (Signed)
Copied from [place] [phone]. Topic: Quick Communication - Rx Refill/Question >> Sep 28, 2017 11:55 AM Synthia Innocent wrote: Medication: meclizine (ANTIVERT) 12.5 MG tablet   Has the patient contacted their pharmacy? Yes.   (Agent: If no, request that the patient contact the pharmacy for the refill.) (Agent: If yes, when and what did the pharmacy advise?)  Preferred Pharmacy (with phone number or street name): [place]  Agent: Please be advised that RX refills may take up to 3 business days. We ask that you follow-up with your pharmacy.

## 2017-09-29 ENCOUNTER — Other Ambulatory Visit: Payer: Self-pay

## 2017-09-29 MED ORDER — MECLIZINE HCL 12.5 MG PO TABS: 12.5000 mg | ORAL_TABLET | Freq: Three times a day (TID) | ORAL | 1 refills | Status: DC | PRN

## 2017-09-29 NOTE — Telephone Encounter (Signed)
Will route to office for final disposition; LOV with medication listed 03/24/17 seen by Dr Alain Marion

## 2017-09-29 NOTE — Telephone Encounter (Signed)
RX sent

## 2017-09-29 NOTE — Progress Notes (Signed)
RX sent

## 2017-10-16 ENCOUNTER — Telehealth: Payer: Self-pay | Admitting: Internal Medicine

## 2017-10-16 ENCOUNTER — Other Ambulatory Visit: Payer: Self-pay

## 2017-10-16 MED ORDER — TRUEPLUS LANCETS 33G MISC: 3 refills | Status: DC

## 2017-10-16 MED ORDER — GLUCOSE BLOOD VI STRP: ORAL_STRIP | 3 refills | Status: DC

## 2017-10-16 MED ORDER — TRUE METRIX AIR GLUCOSE METER DEVI: 1.0000 | Freq: Two times a day (BID) | 0 refills | Status: DC

## 2017-10-16 NOTE — Telephone Encounter (Signed)
Copied from [place] [id number]. Topic: Quick Communication - See Telephone Encounter >> Oct 16, 2017 12:16 PM Bea Graff, NT wrote: CRM for notification. See Telephone encounter for: 10/16/17. Pts wife stated that [place] called for 10 days to request a meter and test strips for this pt and have not had a response. Pt is out of strips. Please advise.

## 2017-10-16 NOTE — Telephone Encounter (Signed)
These were sent to [facility] on 09/22/17 please resend to [facility].

## 2017-10-16 NOTE — Telephone Encounter (Signed)
RX sent

## 2017-10-18 MED ORDER — BD SWAB SINGLE USE REGULAR PADS: 1.0000 | MEDICATED_PAD | Freq: Two times a day (BID) | 3 refills | Status: DC | PRN

## 2017-10-18 NOTE — Addendum Note (Signed)
Addended by: Cresenciano Lick on: 10/18/2017 09:56 AM   Modules accepted: Orders

## 2017-10-20 ENCOUNTER — Telehealth: Payer: Self-pay | Admitting: Endocrinology

## 2017-10-20 NOTE — Telephone Encounter (Signed)
Johnny [name] has been out of Accucheck Aviva Plus test strips for 1 week. Johnny [name] wanted to see if we have any of the above test strips until he receives them from the mail in pharmacy (1-2 weeks). Please call ph# [phone] or [phone] to advise. Johnny [name] has appointment to see Dr. Loanne Drilling on Monday (10/23/17).

## 2017-10-23 ENCOUNTER — Encounter: Payer: Self-pay | Admitting: Endocrinology

## 2017-10-23 ENCOUNTER — Ambulatory Visit: Payer: Medicare PPO | Admitting: Endocrinology

## 2017-10-23 ENCOUNTER — Other Ambulatory Visit: Payer: Self-pay

## 2017-10-23 VITALS — BP 134/66 | HR 68 | Temp 98.1°F | Ht 71.0 in | Wt 240.2 lb

## 2017-10-23 DIAGNOSIS — E1159 Type 2 diabetes mellitus with other circulatory complications: Secondary | ICD-10-CM

## 2017-10-23 LAB — POCT GLYCOSYLATED HEMOGLOBIN (HGB A1C): HEMOGLOBIN A1C: 7.4 % — AB (ref 4.0–5.6)

## 2017-10-23 MED ORDER — BROMOCRIPTINE MESYLATE 2.5 MG PO TABS: 2.5000 mg | ORAL_TABLET | Freq: Every day | ORAL | 11 refills | Status: DC

## 2017-10-23 MED ORDER — METFORMIN HCL 1000 MG PO TABS: 1000.0000 mg | ORAL_TABLET | Freq: Every day | ORAL | 3 refills | Status: DC

## 2017-10-23 NOTE — Patient Instructions (Addendum)
check your blood sugar once a day.  vary the time of day when you check, between before the 3 meals, and at bedtime.  also check if you have symptoms of your blood sugar being too high or too low.  please keep a record of the readings and bring it to your next appointment here (or you can bring the meter itself).  You can write it on any piece of paper.  please call us sooner if your blood sugar goes below 70, or if you have a lot of readings over 200.  I have sent prescriptions to your pharmacy, to increase the bromocriptine to 1 pill daily, and to reduce the metformin to 1 pill each morning.   Please continue the same glimepiride.  Please come back for a follow-up appointment in 3 months.

## 2017-10-23 NOTE — Progress Notes (Signed)
Subjective:    Patient ID: Johnny [name], male    DOB: 14-Aug-1945, 72 y.o.   MRN: [id number]  HPI Pt returns for f/u of diabetes mellitus:  DM type: 2 Dx'ed: 2836 Complications: CAD and renal insuff.   Therapy: 3 oral meds.  DKA: never Severe hypoglycemia: never Pancreatitis: never Pancreatic imaging: never Other: he has never been on insulin, except in the hospital; edema and renal insuff limit rx options; he cannot afford name brand meds.   Interval history: he ran out of strips--he is now awaiting refill by mail.  pt states he feels well in general.   Past Medical History:  Diagnosis Date  . Arthritis   . At risk for sleep apnea    STOP-BANG=  6   SENT TO PCP 05-21-2013  . Benign positional vertigo   . Benign prostatic hypertrophy   . Blood transfusion without reported diagnosis 2000  . CAD (coronary artery disease)    CARDIOLOGIST--  DR Cristopher Peru  . CHF (congestive heart failure) ([place])   . Chronic ischemic heart disease, unspecified   . ED (erectile dysfunction) of organic origin   . GERD (gastroesophageal reflux disease)   . History of CHF (congestive heart failure)    systolic  . HTN (hypertension)   . Hyperlipidemia   . Type 2 diabetes mellitus ([place])   . Wears glasses     Past Surgical History:  Procedure Laterality Date  . CARDIAC CATHETERIZATION  03-03-2000  dr gregg taylor   normal lvsf/  two-vessel cad significant complex stenosis at distal left main  . CARDIOVASCULAR STRESS TEST  08-09-2010  DR Carleene Overlie TAYLOR   normal lexiscan nuclear study/  no ischemia/  normal lvf/  ef 54%  . CATARACT EXTRACTION W/ INTRAOCULAR LENS IMPLANT Right 2011  . COLONOSCOPY  12-30-2009  . CORONARY ARTERY BYPASS GRAFT  03-06-2000   DR [id number]   third vessel  . PENILE PROSTHESIS IMPLANT  02-08-2010   COLOPLAST 3-PIECE INFLATABLE  . PENILE PROSTHESIS IMPLANT N/A 05/27/2013   Procedure: CYSTO REMOVAL OF PENILE PROSTHESIS;  Surgeon: Claybon Jabs, MD;  [facility];  Service: Urology;  Laterality: N/A;  . POLYPECTOMY  12-30-2009   +TA  . TOTAL HIP ARTHROPLASTY  01/24/2011   Procedure: TOTAL HIP ARTHROPLASTY;  Surgeon: Dione Plover [name];  Location: [facility];  Service: Orthopedics;  Laterality: Left;  . TOTAL HIP ARTHROPLASTY Right 01/30/2013   Procedure: RIGHT TOTAL HIP ARTHROPLASTY;  Surgeon: Gearlean Alf, MD;  Location: [facility];  Service: Orthopedics;  Laterality: Right;    Social History   Socioeconomic History  . Marital status: Married    Spouse name: Not on file  . Number of children: Not on file  . Years of education: Not on file  . Highest education level: Not on file  Occupational History  . Occupation: Engineer, building services: RETIRED  Social Needs  . Financial resource strain: Not on file  . Food insecurity:    Worry: Not on file    Inability: Not on file  . Transportation needs:    Medical: Not on file    Non-medical: Not on file  Tobacco Use  . Smoking status: Former Smoker    Types: Cigarettes    Last attempt to quit: 05/22/1970    Years since quitting: 47.4  . Smokeless tobacco: Never Used  Substance and Sexual Activity  . Alcohol use: No    Alcohol/week: 0.0 standard drinks  . Drug  use: No  . Sexual activity: Not on file  Lifestyle  . Physical activity:    Days per week: Not on file    Minutes per session: Not on file  . Stress: Not on file  Relationships  . Social connections:    Talks on phone: Not on file    Gets together: Not on file    Attends religious service: Not on file    Active member of club or organization: Not on file    Attends meetings of clubs or organizations: Not on file    Relationship status: Not on file  . Intimate partner violence:    Fear of current or ex partner: Not on file    Emotionally abused: Not on file    Physically abused: Not on file    Forced sexual activity: Not on file  Other Topics Concern  . Not on file  Social History Narrative   Regular Exercise-No            Current Outpatient Medications on File Prior to Visit  Medication Sig Dispense Refill  . Alcohol Swabs (B-D SINGLE USE SWABS REGULAR) PADS 1 each by Does not apply route 2 (two) times daily as needed. Dx: 250.00 100 each 3  . aspirin 325 MG EC tablet Take 325 mg by mouth every morning.    . Blood Glucose Calibration (ACCU-CHEK AVIVA) SOLN Use as directed as needed. 1 each 2  . Blood Glucose Monitoring Suppl (TRUE METRIX AIR GLUCOSE METER) DEVI 1 Device by Does not apply route 2 (two) times daily. 1 Device 0  . cholecalciferol (VITAMIN D) 1000 UNITS tablet Take 1,000 Units by mouth every morning.    . colchicine 0.6 MG tablet Take 1 tablet (0.6 mg total) by mouth 4 (four) times daily as needed (gout attack). 60 tablet 1  . diazepam (VALIUM) 2 MG tablet Take 1-2 tablets (2-4 mg total) by mouth every 6 (six) hours as needed. Prn vertigo 60 tablet 0  . diclofenac (VOLTAREN) 75 MG EC tablet TAKE 1 TABLET BY MOUTH TWICE DAILY AS NEEDED FOR MODERATE PAIN (GOUT  ATTACK) 90 tablet 1  . finasteride (PROSCAR) 5 MG tablet TAKE 1 TABLET EVERY MORNING 90 tablet 2  . fluticasone furoate-vilanterol (BREO ELLIPTA) 100-25 MCG/INH AEPB Inhale 1 puff into the lungs daily. 1 each 5  . glimepiride (AMARYL) 1 MG tablet Take 0.5 tablets (0.5 mg total) by mouth daily with breakfast. 30 tablet 5  . glucose blood test strip Use twice daily as directed. Dx: 250.00 200 each 3  . irbesartan-hydrochlorothiazide (AVALIDE) 300-12.5 MG tablet TAKE 1 TABLET EVERY DAY 90 tablet 2  . Lancet Devices (ACCU-CHEK SOFTCLIX) lancets Use twice daily as instructed. Dx: 250.00 1 each 3  . lovastatin (MEVACOR) 40 MG tablet TAKE 1 TABLET EVERY MORNING 90 tablet 2  . meclizine (ANTIVERT) 12.5 MG tablet Take 1 tablet (12.5 mg total) by mouth 3 (three) times daily as needed for dizziness. 60 tablet 1  . Multiple Vitamins-Minerals (MULTI-VITAMIN GUMMIES PO)     . nitroGLYCERIN (NITROSTAT) 0.4 MG SL tablet Place 1 tablet (0.4 mg total)  under the tongue every 5 (five) minutes as needed for chest pain. 25 tablet 3  . pantoprazole (PROTONIX) 40 MG tablet TAKE 1 TABLET EVERY MORNING 90 tablet 2  . tamsulosin (FLOMAX) 0.4 MG CAPS capsule TAKE 1 CAPSULE (0.4 MG TOTAL) BY MOUTH DAILY. 90 capsule 3  . TRUEPLUS LANCETS 33G MISC Use twice daily as directed. Dx: 250.00 200 each  3  . VENTOLIN HFA 108 (90 Base) MCG/ACT inhaler INHALE 2 PUFFS INTO THE LUNGS EVERY 4 HOURS AS NEEDED FOR WHEEZING OR SHORTNESS OF BREATH 18 g 5   No current facility-administered medications on file prior to visit.     Allergies  Allergen Reactions  . Enalapril     SOB  . Hydrocodone-Acetaminophen Nausea Only    Can take codein    Family History  Problem Relation Age of Onset  . Hypertension Mother   . Diabetes Father   . Cancer - Other Brother        cancer all over  . Coronary artery disease Other        1st degree male relative  . Hypertension Other   . Cancer Sister 67       ovarian ca  . Colon cancer Neg Hx     BP 134/66 (BP Location: Left Arm, Patient Position: Sitting, Cuff Size: Normal)   Pulse 68   Temp 98.1 F (36.7 C) (Oral)   Ht 5\' 11"  (1.803 m)   Wt 240 lb 3.2 oz (109 kg)   SpO2 98%   BMI 33.50 kg/m    Review of Systems He denies hypoglycemia    Objective:   Physical Exam VITAL SIGNS:  See vs page GENERAL: no distress Pulses: foot pulses are intact bilaterally.   MSK: no deformity of the feet or ankles.  CV: trace bilat edema of the legs or ankles Skin:  no ulcer on the feet or ankles.  normal color and temp on the feet and ankles Neuro: sensation is intact to touch on the feet and ankles.  Ext: There is bilateral onychomycosis of the toenails.    Lab Results  Component Value Date   HGBA1C 7.4 (A) 10/23/2017   Lab Results  Component Value Date   CREATININE 1.28 06/16/2017   BUN 15 06/16/2017   NA 140 06/16/2017   K 3.8 06/16/2017   CL 103 06/16/2017   CO2 29 06/16/2017      Assessment & Plan:  Type 2  DM, with CAD: we need to minimize or d/c amaryl if possible.  Renal insuff: this limits metformin dosage Edema: this limits rx options  Patient Instructions  check your blood sugar once a day.  vary the time of day when you check, between before the 3 meals, and at bedtime.  also check if you have symptoms of your blood sugar being too high or too low.  please keep a record of the readings and bring it to your next appointment here (or you can bring the meter itself).  You can write it on any piece of paper.  please call us sooner if your blood sugar goes below 70, or if you have a lot of readings over 200.  I have sent prescriptions to your pharmacy, to increase the bromocriptine to 1 pill daily, and to reduce the metformin to 1 pill each morning.   Please continue the same glimepiride.  Please come back for a follow-up appointment in 3 months.

## 2017-10-23 NOTE — Telephone Encounter (Signed)
I have called patient & let him know we don't have that particular type of test strips here in the office. He stated that he had an appointment today & that he was still coming in. He also said that mail order was hoping getting his strips within the next few days. If not I advised inexpensive relion meter.

## 2017-10-24 NOTE — Telephone Encounter (Signed)
error 

## 2018-01-03 ENCOUNTER — Other Ambulatory Visit: Payer: Self-pay | Admitting: *Deleted

## 2018-01-03 MED ORDER — DICLOFENAC SODIUM 75 MG PO TBEC: DELAYED_RELEASE_TABLET | ORAL | 1 refills | Status: DC

## 2018-01-12 ENCOUNTER — Other Ambulatory Visit: Payer: Self-pay | Admitting: Internal Medicine

## 2018-01-12 NOTE — Telephone Encounter (Signed)
Copied from [place] [phone]. Topic: Quick Communication - Rx Refill/Question >> Jan 12, 2018  1:37 PM Mcneil, Ja-Kwan wrote: Medication: diclofenac (VOLTAREN) 75 MG EC tablet  Has the patient contacted their pharmacy? yes   Preferred Pharmacy (with phone number or street name): [place], [place] [phone] (Phone) [phone] (Fax)  Agent: Please be advised that RX refills may take up to 3 business days. We ask that you follow-up with your pharmacy.

## 2018-01-12 NOTE — Telephone Encounter (Signed)
[facility] and prescription is there. Per [employer], pt's insurance rejected the refill stated thept is requesting the refill too soon. Called and left message for pt to find out if pt had prescription filled by a local pharmacy.

## 2018-01-12 NOTE — Telephone Encounter (Signed)
[place] called and spoke to [place], [facility] about the requested refill. I asked did the patient pick up a refill from there last month, she says it was sold to him on 12/31/17 #90/0 refills and it was a 45 day supply. I called the patient to advised of the above, left VM to return call to the office.

## 2018-01-22 ENCOUNTER — Other Ambulatory Visit: Payer: Self-pay | Admitting: Endocrinology

## 2018-01-23 ENCOUNTER — Ambulatory Visit: Payer: Medicare PPO | Admitting: Endocrinology

## 2018-01-23 ENCOUNTER — Encounter: Payer: Self-pay | Admitting: Endocrinology

## 2018-01-23 VITALS — BP 110/68 | HR 69 | Ht 71.0 in | Wt 239.4 lb

## 2018-01-23 DIAGNOSIS — E1159 Type 2 diabetes mellitus with other circulatory complications: Secondary | ICD-10-CM

## 2018-01-23 LAB — POCT GLYCOSYLATED HEMOGLOBIN (HGB A1C): HEMOGLOBIN A1C: 8 % — AB (ref 4.0–5.6)

## 2018-01-23 MED ORDER — GLIMEPIRIDE 1 MG PO TABS: 0.5000 mg | ORAL_TABLET | Freq: Every day | ORAL | 6 refills | Status: DC

## 2018-01-23 MED ORDER — METFORMIN HCL ER 500 MG PO TB24: 500.0000 mg | ORAL_TABLET | Freq: Every day | ORAL | 3 refills | Status: DC

## 2018-01-23 MED ORDER — BROMOCRIPTINE MESYLATE 2.5 MG PO TABS: 2.5000 mg | ORAL_TABLET | Freq: Every day | ORAL | 11 refills | Status: DC

## 2018-01-23 NOTE — Patient Instructions (Addendum)
check your blood sugar once a day.  vary the time of day when you check, between before the 3 meals, and at bedtime.  also check if you have symptoms of your blood sugar being too high or too low.  please keep a record of the readings and bring it to your next appointment here (or you can bring the meter itself).  You can write it on any piece of paper.  please call us sooner if your blood sugar goes below 70, or if you have a lot of readings over 200.  I have sent prescriptions to your pharmacy, to increase the bromocriptine to 1 pill daily, and to reduce the metformin to 500 mg each morning.   Please continue the same glimepiride.  Please come back for a follow-up appointment in 2 months.

## 2018-01-23 NOTE — Progress Notes (Signed)
Subjective:    Patient ID: Johnny [name], male    DOB: 1945/11/20, 72 y.o.   MRN: [id number]  HPI Pt returns for f/u of diabetes mellitus:  DM type: 2 Dx'ed: 6378 Complications: CAD and renal insuff.   Therapy: 3 oral meds.  DKA: never Severe hypoglycemia: never Pancreatitis: never Pancreatic imaging: never Other: he has never been on insulin, except in the hospital; edema and renal insuff limit rx options; he cannot afford name brand meds.   Interval history: pt states he feels well in general.  He says cbg varies from 130-200.  He takes parlodel 1.25/d, amaryl 0.5/d, and metformin Past Medical History:  Diagnosis Date  . Arthritis   . At risk for sleep apnea    STOP-BANG=  6   SENT TO PCP 05-21-2013  . Benign positional vertigo   . Benign prostatic hypertrophy   . Blood transfusion without reported diagnosis 2000  . CAD (coronary artery disease)    CARDIOLOGIST--  DR Cristopher Peru  . CHF (congestive heart failure) ([place])   . Chronic ischemic heart disease, unspecified   . ED (erectile dysfunction) of organic origin   . GERD (gastroesophageal reflux disease)   . History of CHF (congestive heart failure)    systolic  . HTN (hypertension)   . Hyperlipidemia   . Type 2 diabetes mellitus ([place])   . Wears glasses     Past Surgical History:  Procedure Laterality Date  . CARDIAC CATHETERIZATION  03-03-2000  dr gregg taylor   normal lvsf/  two-vessel cad significant complex stenosis at distal left main  . CARDIOVASCULAR STRESS TEST  08-09-2010  DR Carleene Overlie TAYLOR   normal lexiscan nuclear study/  no ischemia/  normal lvf/  ef 54%  . CATARACT EXTRACTION W/ INTRAOCULAR LENS IMPLANT Right 2011  . COLONOSCOPY  12-30-2009  . CORONARY ARTERY BYPASS GRAFT  03-06-2000   DR [id number]   third vessel  . PENILE PROSTHESIS IMPLANT  02-08-2010   COLOPLAST 3-PIECE INFLATABLE  . PENILE PROSTHESIS IMPLANT N/A 05/27/2013   Procedure: CYSTO REMOVAL OF PENILE PROSTHESIS;  Surgeon: Claybon Jabs,  MD;  Location: [facility];  Service: Urology;  Laterality: N/A;  . POLYPECTOMY  12-30-2009   +TA  . TOTAL HIP ARTHROPLASTY  01/24/2011   Procedure: TOTAL HIP ARTHROPLASTY;  Surgeon: Dione Plover [name];  Location: [facility];  Service: Orthopedics;  Laterality: Left;  . TOTAL HIP ARTHROPLASTY Right 01/30/2013   Procedure: RIGHT TOTAL HIP ARTHROPLASTY;  Surgeon: Gearlean Alf, MD;  Location: [facility];  Service: Orthopedics;  Laterality: Right;    Social History   Socioeconomic History  . Marital status: Married    Spouse name: Not on file  . Number of children: Not on file  . Years of education: Not on file  . Highest education level: Not on file  Occupational History  . Occupation: Engineer, building services: RETIRED  Social Needs  . Financial resource strain: Not on file  . Food insecurity:    Worry: Not on file    Inability: Not on file  . Transportation needs:    Medical: Not on file    Non-medical: Not on file  Tobacco Use  . Smoking status: Former Smoker    Types: Cigarettes    Last attempt to quit: 05/22/1970    Years since quitting: 47.7  . Smokeless tobacco: Never Used  Substance and Sexual Activity  . Alcohol use: No    Alcohol/week: 0.0 standard drinks  .  Drug use: No  . Sexual activity: Not on file  Lifestyle  . Physical activity:    Days per week: Not on file    Minutes per session: Not on file  . Stress: Not on file  Relationships  . Social connections:    Talks on phone: Not on file    Gets together: Not on file    Attends religious service: Not on file    Active member of club or organization: Not on file    Attends meetings of clubs or organizations: Not on file    Relationship status: Not on file  . Intimate partner violence:    Fear of current or ex partner: Not on file    Emotionally abused: Not on file    Physically abused: Not on file    Forced sexual activity: Not on file  Other Topics Concern  . Not on file  Social History Narrative     Regular Exercise-No          Current Outpatient Medications on File Prior to Visit  Medication Sig Dispense Refill  . Alcohol Swabs (B-D SINGLE USE SWABS REGULAR) PADS 1 each by Does not apply route 2 (two) times daily as needed. Dx: 250.00 100 each 3  . aspirin 325 MG EC tablet Take 325 mg by mouth every morning.    . Blood Glucose Calibration (ACCU-CHEK AVIVA) SOLN Use as directed as needed. 1 each 2  . Blood Glucose Monitoring Suppl (TRUE METRIX AIR GLUCOSE METER) DEVI 1 Device by Does not apply route 2 (two) times daily. 1 Device 0  . cholecalciferol (VITAMIN D) 1000 UNITS tablet Take 1,000 Units by mouth every morning.    . colchicine 0.6 MG tablet Take 1 tablet (0.6 mg total) by mouth 4 (four) times daily as needed (gout attack). 60 tablet 1  . diazepam (VALIUM) 2 MG tablet Take 1-2 tablets (2-4 mg total) by mouth every 6 (six) hours as needed. Prn vertigo 60 tablet 0  . diclofenac (VOLTAREN) 75 MG EC tablet TAKE 1 TABLET BY MOUTH TWICE DAILY AS NEEDED FOR MODERATE PAIN (GOUT  ATTACK) 180 tablet 1  . finasteride (PROSCAR) 5 MG tablet TAKE 1 TABLET EVERY MORNING 90 tablet 2  . fluticasone furoate-vilanterol (BREO ELLIPTA) 100-25 MCG/INH AEPB Inhale 1 puff into the lungs daily. 1 each 5  . glucose blood test strip Use twice daily as directed. Dx: 250.00 200 each 3  . irbesartan-hydrochlorothiazide (AVALIDE) 300-12.5 MG tablet TAKE 1 TABLET EVERY DAY 90 tablet 2  . Lancet Devices (ACCU-CHEK SOFTCLIX) lancets Use twice daily as instructed. Dx: 250.00 1 each 3  . lovastatin (MEVACOR) 40 MG tablet TAKE 1 TABLET EVERY MORNING 90 tablet 2  . meclizine (ANTIVERT) 12.5 MG tablet Take 1 tablet (12.5 mg total) by mouth 3 (three) times daily as needed for dizziness. 60 tablet 1  . Multiple Vitamins-Minerals (MULTI-VITAMIN GUMMIES PO)     . nitroGLYCERIN (NITROSTAT) 0.4 MG SL tablet Place 1 tablet (0.4 mg total) under the tongue every 5 (five) minutes as needed for chest pain. 25 tablet 3  .  pantoprazole (PROTONIX) 40 MG tablet TAKE 1 TABLET EVERY MORNING 90 tablet 2  . tamsulosin (FLOMAX) 0.4 MG CAPS capsule TAKE 1 CAPSULE (0.4 MG TOTAL) BY MOUTH DAILY. 90 capsule 3  . TRUEPLUS LANCETS 33G MISC Use twice daily as directed. Dx: 250.00 200 each 3  . VENTOLIN HFA 108 (90 Base) MCG/ACT inhaler INHALE 2 PUFFS INTO THE LUNGS EVERY 4 HOURS AS  NEEDED FOR WHEEZING OR SHORTNESS OF BREATH 18 g 5   No current facility-administered medications on file prior to visit.     Allergies  Allergen Reactions  . Enalapril     SOB  . Hydrocodone-Acetaminophen Nausea Only    Can take codein    Family History  Problem Relation Age of Onset  . Hypertension Mother   . Diabetes Father   . Cancer - Other Brother        cancer all over  . Coronary artery disease Other        1st degree male relative  . Hypertension Other   . Cancer Sister 47       ovarian ca  . Colon cancer Neg Hx     BP 110/68 (BP Location: Right Arm, Patient Position: Sitting, Cuff Size: Large)   Pulse 69   Ht 5\' 11"  (1.803 m)   Wt 239 lb 6.4 oz (108.6 kg)   SpO2 96%   BMI 33.39 kg/m    Review of Systems He denies hypoglycemia.      Objective:   Physical Exam VITAL SIGNS:  See vs page GENERAL: no distress Pulses: dorsalis pedis intact bilat.   MSK: no deformity of the feet CV: trace bilat leg edema Skin:  no ulcer on the feet, but the skin is dry.  normal color and temp on the feet. Neuro: sensation is intact to touch on the feet  A1c=8.0%   Lab Results  Component Value Date   CREATININE 1.28 06/16/2017   BUN 15 06/16/2017   NA 140 06/16/2017   K 3.8 06/16/2017   CL 103 06/16/2017   CO2 29 06/16/2017       Assessment & Plan:  Type 2 DM, with CAD: worse  Renal insuff: in this context, we should reduce metformin.   Patient Instructions  check your blood sugar once a day.  vary the time of day when you check, between before the 3 meals, and at bedtime.  also check if you have symptoms of your  blood sugar being too high or too low.  please keep a record of the readings and bring it to your next appointment here (or you can bring the meter itself).  You can write it on any piece of paper.  please call us sooner if your blood sugar goes below 70, or if you have a lot of readings over 200.  I have sent prescriptions to your pharmacy, to increase the bromocriptine to 1 pill daily, and to reduce the metformin to 500 mg each morning.   Please continue the same glimepiride.  Please come back for a follow-up appointment in 2 months.

## 2018-01-25 ENCOUNTER — Encounter: Payer: Self-pay | Admitting: Internal Medicine

## 2018-01-25 ENCOUNTER — Ambulatory Visit: Payer: Medicare PPO | Admitting: Internal Medicine

## 2018-01-25 VITALS — BP 132/62 | HR 67 | Temp 98.0°F | Ht 71.0 in | Wt 237.0 lb

## 2018-01-25 DIAGNOSIS — E1159 Type 2 diabetes mellitus with other circulatory complications: Secondary | ICD-10-CM

## 2018-01-25 DIAGNOSIS — I2510 Atherosclerotic heart disease of native coronary artery without angina pectoris: Secondary | ICD-10-CM

## 2018-01-25 DIAGNOSIS — I10 Essential (primary) hypertension: Secondary | ICD-10-CM

## 2018-01-25 DIAGNOSIS — E785 Hyperlipidemia, unspecified: Secondary | ICD-10-CM

## 2018-01-25 DIAGNOSIS — Z23 Encounter for immunization: Secondary | ICD-10-CM

## 2018-01-25 NOTE — Assessment & Plan Note (Signed)
Losartan, NTG prn, Lovastatin, ASA

## 2018-01-25 NOTE — Assessment & Plan Note (Signed)
Irbesartan HCT 

## 2018-01-25 NOTE — Assessment & Plan Note (Addendum)
Lost wt on diet Meds per Dr Loanne Drilling -- A1c was 8.0% Metformin, Glimepiride, Bromocriptine; Diovan  and Lovastatin

## 2018-01-25 NOTE — Assessment & Plan Note (Signed)
On Lovastatin

## 2018-01-25 NOTE — Progress Notes (Signed)
Subjective:  Patient ID: Johnny [name], male    DOB: 1945-11-13  Age: 72 y.o. MRN: [id number]  CC: No chief complaint on file.   HPI COURTENAY CREGER presents for DM, HTN, CAD f/u  Outpatient Medications Prior to Visit  Medication Sig Dispense Refill  . Alcohol Swabs (B-D SINGLE USE SWABS REGULAR) PADS 1 each by Does not apply route 2 (two) times daily as needed. Dx: 250.00 100 each 3  . aspirin 325 MG EC tablet Take 325 mg by mouth every morning.    . Blood Glucose Calibration (ACCU-CHEK AVIVA) SOLN Use as directed as needed. 1 each 2  . Blood Glucose Monitoring Suppl (TRUE METRIX AIR GLUCOSE METER) DEVI 1 Device by Does not apply route 2 (two) times daily. 1 Device 0  . bromocriptine (PARLODEL) 2.5 MG tablet Take 1 tablet (2.5 mg total) by mouth daily. 30 tablet 11  . cholecalciferol (VITAMIN D) 1000 UNITS tablet Take 1,000 Units by mouth every morning.    . colchicine 0.6 MG tablet Take 1 tablet (0.6 mg total) by mouth 4 (four) times daily as needed (gout attack). 60 tablet 1  . diazepam (VALIUM) 2 MG tablet Take 1-2 tablets (2-4 mg total) by mouth every 6 (six) hours as needed. Prn vertigo 60 tablet 0  . diclofenac (VOLTAREN) 75 MG EC tablet TAKE 1 TABLET BY MOUTH TWICE DAILY AS NEEDED FOR MODERATE PAIN (GOUT  ATTACK) 180 tablet 1  . finasteride (PROSCAR) 5 MG tablet TAKE 1 TABLET EVERY MORNING 90 tablet 2  . fluticasone furoate-vilanterol (BREO ELLIPTA) 100-25 MCG/INH AEPB Inhale 1 puff into the lungs daily. 1 each 5  . glimepiride (AMARYL) 1 MG tablet Take 0.5 tablets (0.5 mg total) by mouth daily with breakfast. 30 tablet 6  . glucose blood test strip Use twice daily as directed. Dx: 250.00 200 each 3  . irbesartan-hydrochlorothiazide (AVALIDE) 300-12.5 MG tablet TAKE 1 TABLET EVERY DAY 90 tablet 2  . Lancet Devices (ACCU-CHEK SOFTCLIX) lancets Use twice daily as instructed. Dx: 250.00 1 each 3  . lovastatin (MEVACOR) 40 MG tablet TAKE 1 TABLET EVERY MORNING 90 tablet 2  .  meclizine (ANTIVERT) 12.5 MG tablet Take 1 tablet (12.5 mg total) by mouth 3 (three) times daily as needed for dizziness. 60 tablet 1  . metFORMIN (GLUCOPHAGE-XR) 500 MG 24 hr tablet Take 1 tablet (500 mg total) by mouth daily with breakfast. 90 tablet 3  . Multiple Vitamins-Minerals (MULTI-VITAMIN GUMMIES PO)     . nitroGLYCERIN (NITROSTAT) 0.4 MG SL tablet Place 1 tablet (0.4 mg total) under the tongue every 5 (five) minutes as needed for chest pain. 25 tablet 3  . pantoprazole (PROTONIX) 40 MG tablet TAKE 1 TABLET EVERY MORNING 90 tablet 2  . tamsulosin (FLOMAX) 0.4 MG CAPS capsule TAKE 1 CAPSULE (0.4 MG TOTAL) BY MOUTH DAILY. 90 capsule 3  . TRUEPLUS LANCETS 33G MISC Use twice daily as directed. Dx: 250.00 200 each 3  . VENTOLIN HFA 108 (90 Base) MCG/ACT inhaler INHALE 2 PUFFS INTO THE LUNGS EVERY 4 HOURS AS NEEDED FOR WHEEZING OR SHORTNESS OF BREATH 18 g 5   No facility-administered medications prior to visit.     ROS: Review of Systems  Constitutional: Negative for appetite change, fatigue and unexpected weight change.  HENT: Negative for congestion, nosebleeds, sneezing, sore throat and trouble swallowing.   Eyes: Negative for itching and visual disturbance.  Respiratory: Negative for cough.   Cardiovascular: Negative for chest pain, palpitations and leg swelling.  Gastrointestinal: Negative for abdominal distention, blood in stool, diarrhea and nausea.  Genitourinary: Negative for frequency and hematuria.  Musculoskeletal: Positive for arthralgias and back pain. Negative for gait problem, joint swelling and neck pain.  Skin: Negative for rash.  Neurological: Negative for dizziness, tremors, speech difficulty and weakness.  Psychiatric/Behavioral: Negative for agitation, dysphoric mood and sleep disturbance. The patient is not nervous/anxious.     Objective:  BP 132/62 (BP Location: Left Arm, Patient Position: Sitting, Cuff Size: Large)   Pulse 67   Temp 98 F (36.7 C) (Oral)    Ht 5\' 11"  (1.803 m)   Wt 237 lb (107.5 kg)   SpO2 98%   BMI 33.05 kg/m   BP Readings from Last 3 Encounters:  01/25/18 132/62  01/23/18 110/68  10/23/17 134/66    Wt Readings from Last 3 Encounters:  01/25/18 237 lb (107.5 kg)  01/23/18 239 lb 6.4 oz (108.6 kg)  10/23/17 240 lb 3.2 oz (109 kg)    Physical Exam  Constitutional: He is oriented to person, place, and time. He appears well-developed. No distress.  NAD  HENT:  Mouth/Throat: Oropharynx is clear and moist.  Eyes: Pupils are equal, round, and reactive to light. Conjunctivae are normal.  Neck: Normal range of motion. No JVD present. No thyromegaly present.  Cardiovascular: Normal rate, regular rhythm, normal heart sounds and intact distal pulses. Exam reveals no gallop and no friction rub.  No murmur heard. Pulmonary/Chest: Effort normal and breath sounds normal. No respiratory distress. He has no wheezes. He has no rales. He exhibits no tenderness.  Abdominal: Soft. Bowel sounds are normal. He exhibits no distension and no mass. There is no tenderness. There is no rebound and no guarding.  Musculoskeletal: Normal range of motion. He exhibits no edema or tenderness.  Lymphadenopathy:    He has no cervical adenopathy.  Neurological: He is alert and oriented to person, place, and time. He has normal reflexes. No cranial nerve deficit. He exhibits normal muscle tone. He displays a negative Romberg sign. Coordination and gait normal.  Skin: Skin is warm and dry. No rash noted.  Psychiatric: He has a normal mood and affect. His behavior is normal. Judgment and thought content normal.    Lab Results  Component Value Date   WBC 5.8 07/20/2013   HGB 11.4 (L) 07/20/2013   HCT 35.3 (L) 07/20/2013   PLT 258 07/20/2013   GLUCOSE 98 06/16/2017   CHOL 167 09/09/2016   TRIG 371.0 (H) 09/09/2016   HDL 31.40 (L) 09/09/2016   LDLDIRECT 73.0 09/09/2016   LDLCALC 74 11/29/2013   ALT 17 09/09/2016   AST 13 09/09/2016   NA 140  06/16/2017   K 3.8 06/16/2017   CL 103 06/16/2017   CREATININE 1.28 06/16/2017   BUN 15 06/16/2017   CO2 29 06/16/2017   TSH 3.59 10/14/2010   PSA 0.99 09/09/2016   INR 1.15 01/21/2013   HGBA1C 8.0 (A) 01/23/2018   MICROALBUR <0.7 09/09/2016    Dg Chest 2 View  Result Date: 07/14/2015 CLINICAL DATA:  Shortness of breath and wheezing for 2 weeks EXAM: CHEST  2 VIEW COMPARISON:  07/20/2013 FINDINGS: Cardiac shadow is stable. Postsurgical changes are again seen. The lungs are well aerated bilaterally without evidence of focal infiltrate or sizable effusion. Moderate degenerative changes of thoracic spine are noted. IMPRESSION: No active cardiopulmonary disease. Electronically Signed   By: Inez Catalina M.D.   On: 07/14/2015 08:25    Assessment & Plan:   Diagnoses  and all orders for this visit:  Need for influenza vaccination -     Flu vaccine HIGH DOSE PF (Fluzone High dose)     No orders of the defined types were placed in this encounter.    Follow-up: No follow-ups on file.  Walker Kehr, MD

## 2018-03-23 ENCOUNTER — Ambulatory Visit: Payer: Medicare Other | Admitting: Endocrinology

## 2018-03-23 ENCOUNTER — Encounter: Payer: Self-pay | Admitting: Endocrinology

## 2018-03-23 VITALS — BP 110/54 | HR 60 | Ht 71.0 in | Wt 238.6 lb

## 2018-03-23 DIAGNOSIS — E1159 Type 2 diabetes mellitus with other circulatory complications: Secondary | ICD-10-CM

## 2018-03-23 LAB — POCT GLYCOSYLATED HEMOGLOBIN (HGB A1C): Hemoglobin A1C: 8.9 % — AB (ref 4.0–5.6)

## 2018-03-23 MED ORDER — REPAGLINIDE 1 MG PO TABS: 1.0000 mg | ORAL_TABLET | Freq: Three times a day (TID) | ORAL | 11 refills | Status: DC

## 2018-03-23 NOTE — Patient Instructions (Signed)
I have sent a prescription to your pharmacy, to change glimepiride to repaglinide Please continue the same other diabetes medications.   check your blood sugar once a day.  vary the time of day when you check, between before the 3 meals, and at bedtime.  also check if you have symptoms of your blood sugar being too high or too low.  please keep a record of the readings and bring it to your next appointment here (or you can bring the meter itself).  You can write it on any piece of paper.  please call us sooner if your blood sugar goes below 70, or if you have a lot of readings over 200. Please come back for a follow-up appointment in 2 months.

## 2018-03-23 NOTE — Progress Notes (Signed)
Subjective:    Patient ID: Johnny [name], male    DOB: 01/12/1946, 73 y.o.   MRN: [id number]  HPI Pt returns for f/u of diabetes mellitus:  DM type: 2 Dx'ed: 6387 Complications: CAD and renal insuff.   Therapy: 3 oral meds.  DKA: never Severe hypoglycemia: never Pancreatitis: never Pancreatic imaging: never Other: he has never been on insulin, except in the hospital; edema and renal insuff limit rx options and dosages; he cannot afford name brand meds.   Interval history: pt states he feels well in general.  He says cbg varies from 130-200.  He takes meds as rx'ed.   Past Medical History:  Diagnosis Date  . Arthritis   . At risk for sleep apnea    STOP-BANG=  6   SENT TO PCP 05-21-2013  . Benign positional vertigo   . Benign prostatic hypertrophy   . Blood transfusion without reported diagnosis 2000  . CAD (coronary artery disease)    CARDIOLOGIST--  DR Cristopher Peru  . CHF (congestive heart failure) ([place])   . Chronic ischemic heart disease, unspecified   . ED (erectile dysfunction) of organic origin   . GERD (gastroesophageal reflux disease)   . History of CHF (congestive heart failure)    systolic  . HTN (hypertension)   . Hyperlipidemia   . Type 2 diabetes mellitus ([place])   . Wears glasses     Past Surgical History:  Procedure Laterality Date  . CARDIAC CATHETERIZATION  03-03-2000  dr gregg taylor   normal lvsf/  two-vessel cad significant complex stenosis at distal left main  . CARDIOVASCULAR STRESS TEST  08-09-2010  DR Carleene Overlie TAYLOR   normal lexiscan nuclear study/  no ischemia/  normal lvf/  ef 54%  . CATARACT EXTRACTION W/ INTRAOCULAR LENS IMPLANT Right 2011  . COLONOSCOPY  12-30-2009  . CORONARY ARTERY BYPASS GRAFT  03-06-2000   DR FIEPPIRJ   third vessel  . PENILE PROSTHESIS IMPLANT  02-08-2010   COLOPLAST 3-PIECE INFLATABLE  . PENILE PROSTHESIS IMPLANT N/A 05/27/2013   Procedure: CYSTO REMOVAL OF PENILE PROSTHESIS;  Surgeon: Claybon Jabs, MD;  Location:  [facility];  Service: Urology;  Laterality: N/A;  . POLYPECTOMY  12-30-2009   +TA  . TOTAL HIP ARTHROPLASTY  01/24/2011   Procedure: TOTAL HIP ARTHROPLASTY;  Surgeon: Dione Plover [name];  Location: [facility];  Service: Orthopedics;  Laterality: Left;  . TOTAL HIP ARTHROPLASTY Right 01/30/2013   Procedure: RIGHT TOTAL HIP ARTHROPLASTY;  Surgeon: Gearlean Alf, MD;  Location: [facility];  Service: Orthopedics;  Laterality: Right;    Social History   Socioeconomic History  . Marital status: Married    Spouse name: Not on file  . Number of children: Not on file  . Years of education: Not on file  . Highest education level: Not on file  Occupational History  . Occupation: Engineer, building services: RETIRED  Social Needs  . Financial resource strain: Not on file  . Food insecurity:    Worry: Not on file    Inability: Not on file  . Transportation needs:    Medical: Not on file    Non-medical: Not on file  Tobacco Use  . Smoking status: Former Smoker    Types: Cigarettes    Last attempt to quit: 05/22/1970    Years since quitting: 47.8  . Smokeless tobacco: Never Used  Substance and Sexual Activity  . Alcohol use: No    Alcohol/week: 0.0 standard drinks  .  Drug use: No  . Sexual activity: Not on file  Lifestyle  . Physical activity:    Days per week: Not on file    Minutes per session: Not on file  . Stress: Not on file  Relationships  . Social connections:    Talks on phone: Not on file    Gets together: Not on file    Attends religious service: Not on file    Active member of club or organization: Not on file    Attends meetings of clubs or organizations: Not on file    Relationship status: Not on file  . Intimate partner violence:    Fear of current or ex partner: Not on file    Emotionally abused: Not on file    Physically abused: Not on file    Forced sexual activity: Not on file  Other Topics Concern  . Not on file  Social History Narrative   Regular  Exercise-No          Current Outpatient Medications on File Prior to Visit  Medication Sig Dispense Refill  . Alcohol Swabs (B-D SINGLE USE SWABS REGULAR) PADS 1 each by Does not apply route 2 (two) times daily as needed. Dx: 250.00 100 each 3  . aspirin 325 MG EC tablet Take 325 mg by mouth every morning.    . Blood Glucose Calibration (ACCU-CHEK AVIVA) SOLN Use as directed as needed. 1 each 2  . bromocriptine (PARLODEL) 2.5 MG tablet Take 1 tablet (2.5 mg total) by mouth daily. 30 tablet 11  . cholecalciferol (VITAMIN D) 1000 UNITS tablet Take 1,000 Units by mouth every morning.    . colchicine 0.6 MG tablet Take 1 tablet (0.6 mg total) by mouth 4 (four) times daily as needed (gout attack). 60 tablet 1  . diazepam (VALIUM) 2 MG tablet Take 1-2 tablets (2-4 mg total) by mouth every 6 (six) hours as needed. Prn vertigo 60 tablet 0  . diclofenac (VOLTAREN) 75 MG EC tablet TAKE 1 TABLET BY MOUTH TWICE DAILY AS NEEDED FOR MODERATE PAIN (GOUT  ATTACK) 180 tablet 1  . finasteride (PROSCAR) 5 MG tablet TAKE 1 TABLET EVERY MORNING 90 tablet 2  . fluticasone furoate-vilanterol (BREO ELLIPTA) 100-25 MCG/INH AEPB Inhale 1 puff into the lungs daily. 1 each 5  . glucose blood test strip Use twice daily as directed. Dx: 250.00 200 each 3  . irbesartan-hydrochlorothiazide (AVALIDE) 300-12.5 MG tablet TAKE 1 TABLET EVERY DAY 90 tablet 2  . Lancet Devices (ACCU-CHEK SOFTCLIX) lancets Use twice daily as instructed. Dx: 250.00 1 each 3  . lovastatin (MEVACOR) 40 MG tablet TAKE 1 TABLET EVERY MORNING 90 tablet 2  . meclizine (ANTIVERT) 12.5 MG tablet Take 1 tablet (12.5 mg total) by mouth 3 (three) times daily as needed for dizziness. 60 tablet 1  . metFORMIN (GLUCOPHAGE-XR) 500 MG 24 hr tablet Take 1 tablet (500 mg total) by mouth daily with breakfast. 90 tablet 3  . Multiple Vitamins-Minerals (MULTI-VITAMIN GUMMIES PO)     . nitroGLYCERIN (NITROSTAT) 0.4 MG SL tablet Place 1 tablet (0.4 mg total) under the  tongue every 5 (five) minutes as needed for chest pain. 25 tablet 3  . pantoprazole (PROTONIX) 40 MG tablet TAKE 1 TABLET EVERY MORNING 90 tablet 2  . tamsulosin (FLOMAX) 0.4 MG CAPS capsule TAKE 1 CAPSULE (0.4 MG TOTAL) BY MOUTH DAILY. 90 capsule 3  . VENTOLIN HFA 108 (90 Base) MCG/ACT inhaler INHALE 2 PUFFS INTO THE LUNGS EVERY 4 HOURS AS NEEDED  FOR WHEEZING OR SHORTNESS OF BREATH 18 g 5   No current facility-administered medications on file prior to visit.     Allergies  Allergen Reactions  . Enalapril     SOB  . Hydrocodone-Acetaminophen Nausea Only    Can take codein    Family History  Problem Relation Age of Onset  . Hypertension Mother   . Diabetes Father   . Cancer - Other Brother        cancer all over  . Coronary artery disease Other        1st degree male relative  . Hypertension Other   . Cancer Sister 64       ovarian ca  . Colon cancer Neg Hx     BP (!) 110/54 (BP Location: Right Arm, Patient Position: Sitting, Cuff Size: Large)   Pulse 60   Ht 5\' 11"  (1.803 m)   Wt 238 lb 9.6 oz (108.2 kg)   SpO2 95%   BMI 33.28 kg/m    Review of Systems He denies hypoglycemia    Objective:   Physical Exam VITAL SIGNS:  See vs page GENERAL: no distress Pulses: dorsalis pedis intact bilat.   MSK: no deformity of the feet CV: no leg edema Skin:  no ulcer on the feet.  normal color and temp on the feet.  Old healed surgical scar (vein harvest) on the left leg.   Neuro: sensation is intact to touch on the feet    Lab Results  Component Value Date   HGBA1C 8.9 (A) 03/23/2018    Lab Results  Component Value Date   CREATININE 1.28 06/16/2017   BUN 15 06/16/2017   NA 140 06/16/2017   K 3.8 06/16/2017   CL 103 06/16/2017   CO2 29 06/16/2017       Assessment & Plan:  Type 2 DM, with CAD: worse Renal insuff: this is a relative contraindication to glimepiride  Patient Instructions  I have sent a prescription to your pharmacy, to change glimepiride to  repaglinide Please continue the same other diabetes medications.   check your blood sugar once a day.  vary the time of day when you check, between before the 3 meals, and at bedtime.  also check if you have symptoms of your blood sugar being too high or too low.  please keep a record of the readings and bring it to your next appointment here (or you can bring the meter itself).  You can write it on any piece of paper.  please call us sooner if your blood sugar goes below 70, or if you have a lot of readings over 200. Please come back for a follow-up appointment in 2 months.

## 2018-04-23 ENCOUNTER — Other Ambulatory Visit: Payer: Self-pay

## 2018-04-23 MED ORDER — MECLIZINE HCL 12.5 MG PO TABS: 12.5000 mg | ORAL_TABLET | Freq: Three times a day (TID) | ORAL | 1 refills | Status: AC | PRN

## 2018-04-23 MED ORDER — LOVASTATIN 40 MG PO TABS: 40.0000 mg | ORAL_TABLET | Freq: Every morning | ORAL | 2 refills | Status: DC

## 2018-04-23 MED ORDER — IRBESARTAN-HYDROCHLOROTHIAZIDE 300-12.5 MG PO TABS: 1.0000 | ORAL_TABLET | Freq: Every day | ORAL | 2 refills | Status: DC

## 2018-04-23 MED ORDER — PANTOPRAZOLE SODIUM 40 MG PO TBEC: 40.0000 mg | DELAYED_RELEASE_TABLET | Freq: Every morning | ORAL | 2 refills | Status: DC

## 2018-04-23 MED ORDER — DICLOFENAC SODIUM 75 MG PO TBEC: DELAYED_RELEASE_TABLET | ORAL | 1 refills | Status: DC

## 2018-04-23 MED ORDER — DIAZEPAM 2 MG PO TABS: 2.0000 mg | ORAL_TABLET | Freq: Four times a day (QID) | ORAL | 0 refills | Status: DC | PRN

## 2018-04-23 MED ORDER — FINASTERIDE 5 MG PO TABS: 5.0000 mg | ORAL_TABLET | Freq: Every morning | ORAL | 2 refills | Status: DC

## 2018-04-23 MED ORDER — TAMSULOSIN HCL 0.4 MG PO CAPS: 0.4000 mg | ORAL_CAPSULE | Freq: Every day | ORAL | 3 refills | Status: DC

## 2018-04-26 ENCOUNTER — Ambulatory Visit: Payer: Medicare PPO | Admitting: Internal Medicine

## 2018-05-07 ENCOUNTER — Encounter: Payer: Self-pay | Admitting: Internal Medicine

## 2018-05-07 ENCOUNTER — Other Ambulatory Visit (INDEPENDENT_AMBULATORY_CARE_PROVIDER_SITE_OTHER): Payer: Medicare Other

## 2018-05-07 ENCOUNTER — Ambulatory Visit (INDEPENDENT_AMBULATORY_CARE_PROVIDER_SITE_OTHER): Payer: Medicare Other | Admitting: Internal Medicine

## 2018-05-07 DIAGNOSIS — I259 Chronic ischemic heart disease, unspecified: Secondary | ICD-10-CM

## 2018-05-07 DIAGNOSIS — I10 Essential (primary) hypertension: Secondary | ICD-10-CM

## 2018-05-07 DIAGNOSIS — M7022 Olecranon bursitis, left elbow: Secondary | ICD-10-CM

## 2018-05-07 DIAGNOSIS — M7021 Olecranon bursitis, right elbow: Secondary | ICD-10-CM

## 2018-05-07 DIAGNOSIS — E1159 Type 2 diabetes mellitus with other circulatory complications: Secondary | ICD-10-CM | POA: Diagnosis not present

## 2018-05-07 DIAGNOSIS — E785 Hyperlipidemia, unspecified: Secondary | ICD-10-CM | POA: Diagnosis not present

## 2018-05-07 DIAGNOSIS — M7031 Other bursitis of elbow, right elbow: Secondary | ICD-10-CM | POA: Insufficient documentation

## 2018-05-07 DIAGNOSIS — M7032 Other bursitis of elbow, left elbow: Secondary | ICD-10-CM

## 2018-05-07 LAB — BASIC METABOLIC PANEL
BUN: 21 mg/dL (ref 6–23)
CO2: 27 meq/L (ref 19–32)
Calcium: 9.3 mg/dL (ref 8.4–10.5)
Chloride: 103 mEq/L (ref 96–112)
Creatinine, Ser: 1.3 mg/dL (ref 0.40–1.50)
GFR: 65.38 mL/min (ref >60.00)
GLUCOSE: 165 mg/dL — AB (ref 70–99)
Potassium: 3.8 mEq/L (ref 3.5–5.1)
Sodium: 137 mEq/L (ref 135–145)

## 2018-05-07 LAB — HEPATIC FUNCTION PANEL
ALT: 14 U/L (ref 0–53)
AST: 10 U/L (ref 0–37)
Albumin: 3.9 g/dL (ref 3.5–5.2)
Alkaline Phosphatase: 82 U/L (ref 39–117)
BILIRUBIN TOTAL: 0.4 mg/dL (ref 0.2–1.2)
Bilirubin, Direct: 0.1 mg/dL (ref 0.0–0.3)
Total Protein: 6.9 g/dL (ref 6.0–8.3)

## 2018-05-07 LAB — URIC ACID: Uric Acid, Serum: 8.5 mg/dL — ABNORMAL HIGH (ref 4.0–7.8)

## 2018-05-07 LAB — SEDIMENTATION RATE: Sed Rate: 53 mm/hr — ABNORMAL HIGH (ref 0–20)

## 2018-05-07 MED ORDER — TAMSULOSIN HCL 0.4 MG PO CAPS: 0.4000 mg | ORAL_CAPSULE | Freq: Every day | ORAL | 3 refills | Status: DC

## 2018-05-07 MED ORDER — COLCHICINE 0.6 MG PO TABS: 0.6000 mg | ORAL_TABLET | Freq: Every day | ORAL | 3 refills | Status: DC

## 2018-05-07 NOTE — Assessment & Plan Note (Signed)
Labs

## 2018-05-07 NOTE — Assessment & Plan Note (Signed)
Lovastatin 

## 2018-05-07 NOTE — Assessment & Plan Note (Signed)
Wt Readings from Last 3 Encounters:  05/07/18 238 lb (108 kg)  03/23/18 238 lb 9.6 oz (108.2 kg)  01/25/18 237 lb (107.5 kg)

## 2018-05-07 NOTE — Progress Notes (Signed)
Subjective:  Patient ID: Johnny [name], male    DOB: 11-23-1945  Age: 73 y.o. MRN: [id number]  CC: No chief complaint on file.   HPI Johnny [name] presents for DM, HTN, BPH f/u   Outpatient Medications Prior to Visit  Medication Sig Dispense Refill  . Alcohol Swabs (B-D SINGLE USE SWABS REGULAR) PADS 1 each by Does not apply route 2 (two) times daily as needed. Dx: 250.00 100 each 3  . aspirin 325 MG EC tablet Take 325 mg by mouth every morning.    . Blood Glucose Calibration (ACCU-CHEK AVIVA) SOLN Use as directed as needed. 1 each 2  . bromocriptine (PARLODEL) 2.5 MG tablet Take 1 tablet (2.5 mg total) by mouth daily. 30 tablet 11  . cholecalciferol (VITAMIN D) 1000 UNITS tablet Take 1,000 Units by mouth every morning.    . colchicine 0.6 MG tablet Take 1 tablet (0.6 mg total) by mouth 4 (four) times daily as needed (gout attack). 60 tablet 1  . diazepam (VALIUM) 2 MG tablet Take 1-2 tablets (2-4 mg total) by mouth every 6 (six) hours as needed. Prn vertigo 60 tablet 0  . diclofenac (VOLTAREN) 75 MG EC tablet TAKE 1 TABLET BY MOUTH TWICE DAILY AS NEEDED FOR MODERATE PAIN (GOUT  ATTACK) 180 tablet 1  . finasteride (PROSCAR) 5 MG tablet Take 1 tablet (5 mg total) by mouth every morning. 90 tablet 2  . fluticasone furoate-vilanterol (BREO ELLIPTA) 100-25 MCG/INH AEPB Inhale 1 puff into the lungs daily. 1 each 5  . glucose blood test strip Use twice daily as directed. Dx: 250.00 200 each 3  . irbesartan-hydrochlorothiazide (AVALIDE) 300-12.5 MG tablet Take 1 tablet by mouth daily. 90 tablet 2  . Lancet Devices (ACCU-CHEK SOFTCLIX) lancets Use twice daily as instructed. Dx: 250.00 1 each 3  . lovastatin (MEVACOR) 40 MG tablet Take 1 tablet (40 mg total) by mouth every morning. 90 tablet 2  . meclizine (ANTIVERT) 12.5 MG tablet Take 1 tablet (12.5 mg total) by mouth 3 (three) times daily as needed for dizziness. 60 tablet 1  . metFORMIN (GLUCOPHAGE-XR) 500 MG 24 hr tablet Take 1 tablet  (500 mg total) by mouth daily with breakfast. 90 tablet 3  . Multiple Vitamins-Minerals (MULTI-VITAMIN GUMMIES PO)     . nitroGLYCERIN (NITROSTAT) 0.4 MG SL tablet Place 1 tablet (0.4 mg total) under the tongue every 5 (five) minutes as needed for chest pain. 25 tablet 3  . pantoprazole (PROTONIX) 40 MG tablet Take 1 tablet (40 mg total) by mouth every morning. 90 tablet 2  . repaglinide (PRANDIN) 1 MG tablet Take 1 tablet (1 mg total) by mouth 3 (three) times daily before meals. 90 tablet 11  . tamsulosin (FLOMAX) 0.4 MG CAPS capsule Take 1 capsule (0.4 mg total) by mouth daily. 90 capsule 3  . VENTOLIN HFA 108 (90 Base) MCG/ACT inhaler INHALE 2 PUFFS INTO THE LUNGS EVERY 4 HOURS AS NEEDED FOR WHEEZING OR SHORTNESS OF BREATH 18 g 5   No facility-administered medications prior to visit.     ROS: Review of Systems  Constitutional: Negative for appetite change, fatigue and unexpected weight change.  HENT: Negative for congestion, nosebleeds, sneezing, sore throat and trouble swallowing.   Eyes: Negative for itching and visual disturbance.  Respiratory: Negative for cough.   Cardiovascular: Negative for chest pain, palpitations and leg swelling.  Gastrointestinal: Negative for abdominal distention, blood in stool, diarrhea and nausea.  Genitourinary: Negative for frequency and hematuria.  Musculoskeletal: Positive  for arthralgias. Negative for back pain, gait problem, joint swelling and neck pain.  Skin: Negative for rash.  Neurological: Negative for dizziness, tremors, speech difficulty and weakness.  Psychiatric/Behavioral: Negative for agitation, dysphoric mood, sleep disturbance and suicidal ideas. The patient is not nervous/anxious.     Objective:  BP (!) 116/58 (BP Location: Right Arm, Patient Position: Sitting, Cuff Size: Large)   Pulse (!) 50   Temp 97.8 F (36.6 C) (Oral)   Ht 5\' 11"  (1.803 m)   Wt 238 lb (108 kg)   SpO2 98%   BMI 33.19 kg/m   BP Readings from Last 3  Encounters:  05/07/18 (!) 116/58  03/23/18 (!) 110/54  01/25/18 132/62    Wt Readings from Last 3 Encounters:  05/07/18 238 lb (108 kg)  03/23/18 238 lb 9.6 oz (108.2 kg)  01/25/18 237 lb (107.5 kg)    Physical Exam Constitutional:      General: He is not in acute distress.    Appearance: He is well-developed.     Comments: NAD  Eyes:     Conjunctiva/sclera: Conjunctivae normal.     Pupils: Pupils are equal, round, and reactive to light.  Neck:     Musculoskeletal: Normal range of motion.     Thyroid: No thyromegaly.     Vascular: No JVD.  Cardiovascular:     Rate and Rhythm: Normal rate and regular rhythm.     Heart sounds: Normal heart sounds. No murmur. No friction rub. No gallop.   Pulmonary:     Effort: Pulmonary effort is normal. No respiratory distress.     Breath sounds: Normal breath sounds. No wheezing or rales.  Chest:     Chest Basinski: No tenderness.  Abdominal:     General: Bowel sounds are normal. There is no distension.     Palpations: Abdomen is soft. There is no mass.     Tenderness: There is no abdominal tenderness. There is no guarding or rebound.  Musculoskeletal: Normal range of motion.        General: Swelling present. No tenderness.  Lymphadenopathy:     Cervical: No cervical adenopathy.  Skin:    General: Skin is warm and dry.     Findings: No rash.  Neurological:     Mental Status: He is alert and oriented to person, place, and time.     Cranial Nerves: No cranial nerve deficit.     Motor: No abnormal muscle tone.     Coordination: Coordination normal.     Gait: Gait normal.     Deep Tendon Reflexes: Reflexes are normal and symmetric.  Psychiatric:        Behavior: Behavior normal.        Thought Content: Thought content normal.        Judgment: Judgment normal.   obese B elbows w/bursitis L>R Lab Results  Component Value Date   WBC 5.8 07/20/2013   HGB 11.4 (L) 07/20/2013   HCT 35.3 (L) 07/20/2013   PLT 258 07/20/2013   GLUCOSE 98  06/16/2017   CHOL 167 09/09/2016   TRIG 371.0 (H) 09/09/2016   HDL 31.40 (L) 09/09/2016   LDLDIRECT 73.0 09/09/2016   LDLCALC 74 11/29/2013   ALT 17 09/09/2016   AST 13 09/09/2016   NA 140 06/16/2017   K 3.8 06/16/2017   CL 103 06/16/2017   CREATININE 1.28 06/16/2017   BUN 15 06/16/2017   CO2 29 06/16/2017   TSH 3.59 10/14/2010   PSA 0.99 09/09/2016  INR 1.15 01/21/2013   HGBA1C 8.9 (A) 03/23/2018   MICROALBUR <0.7 09/09/2016    Dg Chest 2 View  Result Date: 07/14/2015 CLINICAL DATA:  Shortness of breath and wheezing for 2 weeks EXAM: CHEST  2 VIEW COMPARISON:  07/20/2013 FINDINGS: Cardiac shadow is stable. Postsurgical changes are again seen. The lungs are well aerated bilaterally without evidence of focal infiltrate or sizable effusion. Moderate degenerative changes of thoracic spine are noted. IMPRESSION: No active cardiopulmonary disease. Electronically Signed   By: Inez Catalina M.D.   On: 07/14/2015 08:25    Assessment & Plan:   There are no diagnoses linked to this encounter.   No orders of the defined types were placed in this encounter.    Follow-up: No follow-ups on file.  Walker Kehr, MD

## 2018-05-07 NOTE — Assessment & Plan Note (Signed)
Irbesartan HCT 

## 2018-05-07 NOTE — Assessment & Plan Note (Signed)
ASA, Lovastatin

## 2018-05-07 NOTE — Assessment & Plan Note (Signed)
Metformin, Prandin, Bromocriptine

## 2018-05-08 LAB — RHEUMATOID FACTOR: Rheumatoid fact SerPl-aCnc: <14 IU/mL (ref <14)

## 2018-05-09 ENCOUNTER — Other Ambulatory Visit: Payer: Self-pay | Admitting: Internal Medicine

## 2018-05-09 MED ORDER — ALLOPURINOL 100 MG PO TABS: 100.0000 mg | ORAL_TABLET | Freq: Every day | ORAL | 3 refills | Status: DC

## 2018-05-15 ENCOUNTER — Other Ambulatory Visit: Payer: Self-pay | Admitting: Internal Medicine

## 2018-05-25 ENCOUNTER — Ambulatory Visit: Payer: Medicare Other | Admitting: Endocrinology

## 2018-05-25 ENCOUNTER — Encounter: Payer: Self-pay | Admitting: Endocrinology

## 2018-05-25 ENCOUNTER — Other Ambulatory Visit: Payer: Self-pay

## 2018-05-25 VITALS — BP 114/60 | HR 66 | Ht 71.0 in | Wt 234.2 lb

## 2018-05-25 DIAGNOSIS — E1159 Type 2 diabetes mellitus with other circulatory complications: Secondary | ICD-10-CM | POA: Diagnosis not present

## 2018-05-25 LAB — POCT GLYCOSYLATED HEMOGLOBIN (HGB A1C): Hemoglobin A1C: 9.5 % — AB (ref 4.0–5.6)

## 2018-05-25 MED ORDER — REPAGLINIDE 2 MG PO TABS: 2.0000 mg | ORAL_TABLET | Freq: Three times a day (TID) | ORAL | 3 refills | Status: DC

## 2018-05-25 NOTE — Patient Instructions (Addendum)
Here is a prescription, to increase the repaglinide. Please also increase the bromocriptine to a whole pill per day. Please continue the same metformin.   check your blood sugar once a day.  vary the time of day when you check, between before the 3 meals, and at bedtime.  also check if you have symptoms of your blood sugar being too high or too low.  please keep a record of the readings and bring it to your next appointment here (or you can bring the meter itself).  You can write it on any piece of paper.  please call us sooner if your blood sugar goes below 70, or if you have a lot of readings over 200. Please come back for a follow-up appointment in 2 months.

## 2018-05-25 NOTE — Progress Notes (Signed)
Subjective:    Patient ID: Johnny [name], male    DOB: 03/10/1945, 73 y.o.   MRN: [id number]  HPI Pt returns for f/u of diabetes mellitus:  DM type: 2 Dx'ed: 5027 Complications: CAD and renal insuff.   Therapy: 3 oral meds.  DKA: never Severe hypoglycemia: never Pancreatitis: never Pancreatic imaging: never Other: he has never been on insulin, except in the hospital; he learned how to take insulin.  edema and renal insuff limit rx options and dosages; he cannot afford name brand meds.   Interval history: pt states he feels well in general.  He says cbg varies from 130-200.  He sometimes misses the repaglinide, and he takes bromocriptine just 1/2 tab per day.   Past Medical History:  Diagnosis Date  . Arthritis   . At risk for sleep apnea    STOP-BANG=  6   SENT TO PCP 05-21-2013  . Benign positional vertigo   . Benign prostatic hypertrophy   . Blood transfusion without reported diagnosis 2000  . CAD (coronary artery disease)    CARDIOLOGIST--  DR Cristopher Peru  . CHF (congestive heart failure) ([place])   . Chronic ischemic heart disease, unspecified   . ED (erectile dysfunction) of organic origin   . GERD (gastroesophageal reflux disease)   . History of CHF (congestive heart failure)    systolic  . HTN (hypertension)   . Hyperlipidemia   . Type 2 diabetes mellitus ([place])   . Wears glasses     Past Surgical History:  Procedure Laterality Date  . CARDIAC CATHETERIZATION  03-03-2000  dr gregg taylor   normal lvsf/  two-vessel cad significant complex stenosis at distal left main  . CARDIOVASCULAR STRESS TEST  08-09-2010  DR Carleene Overlie TAYLOR   normal lexiscan nuclear study/  no ischemia/  normal lvf/  ef 54%  . CATARACT EXTRACTION W/ INTRAOCULAR LENS IMPLANT Right 2011  . COLONOSCOPY  12-30-2009  . CORONARY ARTERY BYPASS GRAFT  03-06-2000   DR XAJOINOM   third vessel  . PENILE PROSTHESIS IMPLANT  02-08-2010   COLOPLAST 3-PIECE INFLATABLE  . PENILE PROSTHESIS IMPLANT N/A  05/27/2013   Procedure: CYSTO REMOVAL OF PENILE PROSTHESIS;  Surgeon: Claybon Jabs, MD;  Location: [facility];  Service: Urology;  Laterality: N/A;  . POLYPECTOMY  12-30-2009   +TA  . TOTAL HIP ARTHROPLASTY  01/24/2011   Procedure: TOTAL HIP ARTHROPLASTY;  Surgeon: Dione Plover [name];  Location: [facility];  Service: Orthopedics;  Laterality: Left;  . TOTAL HIP ARTHROPLASTY Right 01/30/2013   Procedure: RIGHT TOTAL HIP ARTHROPLASTY;  Surgeon: Gearlean Alf, MD;  Location: [facility];  Service: Orthopedics;  Laterality: Right;    Social History   Socioeconomic History  . Marital status: Married    Spouse name: Not on file  . Number of children: Not on file  . Years of education: Not on file  . Highest education level: Not on file  Occupational History  . Occupation: Engineer, building services: RETIRED  Social Needs  . Financial resource strain: Not on file  . Food insecurity:    Worry: Not on file    Inability: Not on file  . Transportation needs:    Medical: Not on file    Non-medical: Not on file  Tobacco Use  . Smoking status: Former Smoker    Types: Cigarettes    Last attempt to quit: 05/22/1970    Years since quitting: 48.0  . Smokeless tobacco: Never Used  Substance and Sexual Activity  . Alcohol use: No    Alcohol/week: 0.0 standard drinks  . Drug use: No  . Sexual activity: Not on file  Lifestyle  . Physical activity:    Days per week: Not on file    Minutes per session: Not on file  . Stress: Not on file  Relationships  . Social connections:    Talks on phone: Not on file    Gets together: Not on file    Attends religious service: Not on file    Active member of club or organization: Not on file    Attends meetings of clubs or organizations: Not on file    Relationship status: Not on file  . Intimate partner violence:    Fear of current or ex partner: Not on file    Emotionally abused: Not on file    Physically abused: Not on file    Forced sexual  activity: Not on file  Other Topics Concern  . Not on file  Social History Narrative   Regular Exercise-No          Current Outpatient Medications on File Prior to Visit  Medication Sig Dispense Refill  . Alcohol Swabs (B-D SINGLE USE SWABS REGULAR) PADS 1 each by Does not apply route 2 (two) times daily as needed. Dx: 250.00 100 each 3  . allopurinol (ZYLOPRIM) 100 MG tablet Take 1 tablet (100 mg total) by mouth daily. 90 tablet 3  . aspirin 325 MG EC tablet Take 325 mg by mouth every morning.    . Blood Glucose Calibration (ACCU-CHEK AVIVA) SOLN Use as directed as needed. 1 each 2  . bromocriptine (PARLODEL) 2.5 MG tablet Take 1 tablet (2.5 mg total) by mouth daily. 30 tablet 11  . cholecalciferol (VITAMIN D) 1000 UNITS tablet Take 1,000 Units by mouth every morning.    . colchicine 0.6 MG tablet Take 1 tablet (0.6 mg total) by mouth daily. 90 tablet 3  . diazepam (VALIUM) 2 MG tablet Take 1-2 tablets (2-4 mg total) by mouth every 6 (six) hours as needed. Prn vertigo 60 tablet 0  . diclofenac (VOLTAREN) 75 MG EC tablet TAKE 1 TABLET BY MOUTH TWICE DAILY AS NEEDED FOR MODERATE PAIN (GOUT  ATTACK) 90 tablet 0  . finasteride (PROSCAR) 5 MG tablet Take 1 tablet (5 mg total) by mouth every morning. 90 tablet 2  . fluticasone furoate-vilanterol (BREO ELLIPTA) 100-25 MCG/INH AEPB Inhale 1 puff into the lungs daily. 1 each 5  . glucose blood test strip Use twice daily as directed. Dx: 250.00 200 each 3  . irbesartan-hydrochlorothiazide (AVALIDE) 300-12.5 MG tablet Take 1 tablet by mouth daily. 90 tablet 2  . Lancet Devices (ACCU-CHEK SOFTCLIX) lancets Use twice daily as instructed. Dx: 250.00 1 each 3  . lovastatin (MEVACOR) 40 MG tablet Take 1 tablet (40 mg total) by mouth every morning. 90 tablet 2  . meclizine (ANTIVERT) 12.5 MG tablet Take 1 tablet (12.5 mg total) by mouth 3 (three) times daily as needed for dizziness. 60 tablet 1  . metFORMIN (GLUCOPHAGE-XR) 500 MG 24 hr tablet Take 1  tablet (500 mg total) by mouth daily with breakfast. 90 tablet 3  . Multiple Vitamins-Minerals (MULTI-VITAMIN GUMMIES PO)     . nitroGLYCERIN (NITROSTAT) 0.4 MG SL tablet Place 1 tablet (0.4 mg total) under the tongue every 5 (five) minutes as needed for chest pain. 25 tablet 3  . pantoprazole (PROTONIX) 40 MG tablet Take 1 tablet (40 mg total) by  mouth every morning. 90 tablet 2  . tamsulosin (FLOMAX) 0.4 MG CAPS capsule Take 1 capsule (0.4 mg total) by mouth daily. 90 capsule 3  . VENTOLIN HFA 108 (90 Base) MCG/ACT inhaler INHALE 2 PUFFS INTO THE LUNGS EVERY 4 HOURS AS NEEDED FOR WHEEZING OR SHORTNESS OF BREATH 18 g 5   No current facility-administered medications on file prior to visit.     Allergies  Allergen Reactions  . Enalapril     SOB  . Hydrocodone-Acetaminophen Nausea Only    Can take codein    Family History  Problem Relation Age of Onset  . Hypertension Mother   . Diabetes Father   . Cancer - Other Brother        cancer all over  . Coronary artery disease Other        1st degree male relative  . Hypertension Other   . Cancer Sister 17       ovarian ca  . Colon cancer Neg Hx     BP 114/60 (BP Location: Left Arm, Patient Position: Sitting, Cuff Size: Large)   Pulse 66   Ht 5\' 11"  (1.803 m)   Wt 234 lb 3.2 oz (106.2 kg)   SpO2 96%   BMI 32.66 kg/m    Review of Systems He denies hypoglycemia.      Objective:   Physical Exam VITAL SIGNS:  See vs page GENERAL: no distress Pulses: dorsalis pedis intact bilat.   MSK: no deformity of the feet CV: 1+ bilat leg edema Skin:  no ulcer on the feet.  normal color and temp on the feet.  Old healed surgical scar (vein harvest) on the left leg.   Neuro: sensation is intact to touch on the feet Ext: There is bilateral onychomycosis of the toenails  Lab Results  Component Value Date   HGBA1C 9.5 (A) 05/25/2018   Lab Results  Component Value Date   CREATININE 1.30 05/07/2018   BUN 21 05/07/2018   NA 137  05/07/2018   K 3.8 05/07/2018   CL 103 05/07/2018   CO2 27 05/07/2018       Assessment & Plan:  Type 2 DM, with CAD: worse Renal insuff: This limits rx options Edema: This also limits rx options   Patient Instructions  Here is a prescription, to increase the repaglinide. Please also increase the bromocriptine to a whole pill per day. Please continue the same metformin.   check your blood sugar once a day.  vary the time of day when you check, between before the 3 meals, and at bedtime.  also check if you have symptoms of your blood sugar being too high or too low.  please keep a record of the readings and bring it to your next appointment here (or you can bring the meter itself).  You can write it on any piece of paper.  please call us sooner if your blood sugar goes below 70, or if you have a lot of readings over 200. Please come back for a follow-up appointment in 2 months.

## 2018-06-13 NOTE — Telephone Encounter (Signed)
error 

## 2018-07-23 ENCOUNTER — Other Ambulatory Visit: Payer: Self-pay

## 2018-07-25 ENCOUNTER — Encounter: Payer: Self-pay | Admitting: Endocrinology

## 2018-07-25 ENCOUNTER — Ambulatory Visit (INDEPENDENT_AMBULATORY_CARE_PROVIDER_SITE_OTHER): Payer: Medicare Other | Admitting: Endocrinology

## 2018-07-25 ENCOUNTER — Other Ambulatory Visit: Payer: Self-pay

## 2018-07-25 VITALS — BP 160/64 | HR 72 | Ht 71.0 in | Wt 241.2 lb

## 2018-07-25 DIAGNOSIS — E1159 Type 2 diabetes mellitus with other circulatory complications: Secondary | ICD-10-CM

## 2018-07-25 DIAGNOSIS — E1129 Type 2 diabetes mellitus with other diabetic kidney complication: Secondary | ICD-10-CM | POA: Diagnosis not present

## 2018-07-25 LAB — POCT GLYCOSYLATED HEMOGLOBIN (HGB A1C): Hemoglobin A1C: 9.2 % — AB (ref 4.0–5.6)

## 2018-07-25 MED ORDER — METFORMIN HCL ER 500 MG PO TB24: 500.0000 mg | ORAL_TABLET | Freq: Every day | ORAL | 3 refills | Status: DC

## 2018-07-25 NOTE — Patient Instructions (Addendum)
Please continue the same medications check your blood sugar once a day.  vary the time of day when you check, between before the 3 meals, and at bedtime.  also check if you have symptoms of your blood sugar being too high or too low.  please keep a record of the readings and bring it to your next appointment here (or you can bring the meter itself).  You can write it on any piece of paper.  please call us sooner if your blood sugar goes below 70, or if you have a lot of readings over 200.  Please come back for a follow-up appointment in 2 months.  

## 2018-07-25 NOTE — Progress Notes (Signed)
Subjective:    Patient ID: Johnny [name], male    DOB: 09-30-1945, 73 y.o.   MRN: [id number]  HPI Pt returns for f/u of diabetes mellitus:  DM type: 2 Dx'ed: 2951 Complications: CAD and renal insuff.   Therapy: 3 oral meds.  DKA: never Severe hypoglycemia: never Pancreatitis: never Pancreatic imaging: never Other: he has never been on insulin, except in the hospital, but he has learned how to take insulin.  edema and renal insuff limit rx options and dosages; he cannot afford name brand meds.   Interval history: pt states he feels well in general.  He says cbg's are in the mid-100's.  He takes meds as rx'ed.   Past Medical History:  Diagnosis Date  . Arthritis   . At risk for sleep apnea    STOP-BANG=  6   SENT TO PCP 05-21-2013  . Benign positional vertigo   . Benign prostatic hypertrophy   . Blood transfusion without reported diagnosis 2000  . CAD (coronary artery disease)    CARDIOLOGIST--  DR Cristopher Peru  . CHF (congestive heart failure) ([place])   . Chronic ischemic heart disease, unspecified   . ED (erectile dysfunction) of organic origin   . GERD (gastroesophageal reflux disease)   . History of CHF (congestive heart failure)    systolic  . HTN (hypertension)   . Hyperlipidemia   . Type 2 diabetes mellitus ([place])   . Wears glasses     Past Surgical History:  Procedure Laterality Date  . CARDIAC CATHETERIZATION  03-03-2000  dr gregg taylor   normal lvsf/  two-vessel cad significant complex stenosis at distal left main  . CARDIOVASCULAR STRESS TEST  08-09-2010  DR Carleene Overlie TAYLOR   normal lexiscan nuclear study/  no ischemia/  normal lvf/  ef 54%  . CATARACT EXTRACTION W/ INTRAOCULAR LENS IMPLANT Right 2011  . COLONOSCOPY  12-30-2009  . CORONARY ARTERY BYPASS GRAFT  03-06-2000   DR OACZYSAY   third vessel  . PENILE PROSTHESIS IMPLANT  02-08-2010   COLOPLAST 3-PIECE INFLATABLE  . PENILE PROSTHESIS IMPLANT N/A 05/27/2013   Procedure: CYSTO REMOVAL OF PENILE  PROSTHESIS;  Surgeon: Claybon Jabs, MD;  Location: [facility];  Service: Urology;  Laterality: N/A;  . POLYPECTOMY  12-30-2009   +TA  . TOTAL HIP ARTHROPLASTY  01/24/2011   Procedure: TOTAL HIP ARTHROPLASTY;  Surgeon: Dione Plover [name];  Location: [facility];  Service: Orthopedics;  Laterality: Left;  . TOTAL HIP ARTHROPLASTY Right 01/30/2013   Procedure: RIGHT TOTAL HIP ARTHROPLASTY;  Surgeon: Gearlean Alf, MD;  Location: [facility];  Service: Orthopedics;  Laterality: Right;    Social History   Socioeconomic History  . Marital status: Married    Spouse name: Not on file  . Number of children: Not on file  . Years of education: Not on file  . Highest education level: Not on file  Occupational History  . Occupation: Engineer, building services: RETIRED  Social Needs  . Financial resource strain: Not on file  . Food insecurity:    Worry: Not on file    Inability: Not on file  . Transportation needs:    Medical: Not on file    Non-medical: Not on file  Tobacco Use  . Smoking status: Former Smoker    Types: Cigarettes    Last attempt to quit: 05/22/1970    Years since quitting: 48.2  . Smokeless tobacco: Never Used  Substance and Sexual Activity  .  Alcohol use: No    Alcohol/week: 0.0 standard drinks  . Drug use: No  . Sexual activity: Not on file  Lifestyle  . Physical activity:    Days per week: Not on file    Minutes per session: Not on file  . Stress: Not on file  Relationships  . Social connections:    Talks on phone: Not on file    Gets together: Not on file    Attends religious service: Not on file    Active member of club or organization: Not on file    Attends meetings of clubs or organizations: Not on file    Relationship status: Not on file  . Intimate partner violence:    Fear of current or ex partner: Not on file    Emotionally abused: Not on file    Physically abused: Not on file    Forced sexual activity: Not on file  Other Topics Concern  .  Not on file  Social History Narrative   Regular Exercise-No          Current Outpatient Medications on File Prior to Visit  Medication Sig Dispense Refill  . Alcohol Swabs (B-D SINGLE USE SWABS REGULAR) PADS 1 each by Does not apply route 2 (two) times daily as needed. Dx: 250.00 100 each 3  . allopurinol (ZYLOPRIM) 100 MG tablet Take 1 tablet (100 mg total) by mouth daily. 90 tablet 3  . aspirin 325 MG EC tablet Take 325 mg by mouth every morning.    . Blood Glucose Calibration (ACCU-CHEK AVIVA) SOLN Use as directed as needed. 1 each 2  . bromocriptine (PARLODEL) 2.5 MG tablet Take 1 tablet (2.5 mg total) by mouth daily. 30 tablet 11  . cholecalciferol (VITAMIN D) 1000 UNITS tablet Take 1,000 Units by mouth every morning.    . colchicine 0.6 MG tablet Take 1 tablet (0.6 mg total) by mouth daily. 90 tablet 3  . diazepam (VALIUM) 2 MG tablet Take 1-2 tablets (2-4 mg total) by mouth every 6 (six) hours as needed. Prn vertigo 60 tablet 0  . diclofenac (VOLTAREN) 75 MG EC tablet TAKE 1 TABLET BY MOUTH TWICE DAILY AS NEEDED FOR MODERATE PAIN (GOUT  ATTACK) 90 tablet 0  . finasteride (PROSCAR) 5 MG tablet Take 1 tablet (5 mg total) by mouth every morning. 90 tablet 2  . fluticasone furoate-vilanterol (BREO ELLIPTA) 100-25 MCG/INH AEPB Inhale 1 puff into the lungs daily. 1 each 5  . glucose blood test strip Use twice daily as directed. Dx: 250.00 200 each 3  . irbesartan-hydrochlorothiazide (AVALIDE) 300-12.5 MG tablet Take 1 tablet by mouth daily. 90 tablet 2  . Lancet Devices (ACCU-CHEK SOFTCLIX) lancets Use twice daily as instructed. Dx: 250.00 1 each 3  . lovastatin (MEVACOR) 40 MG tablet Take 1 tablet (40 mg total) by mouth every morning. 90 tablet 2  . meclizine (ANTIVERT) 12.5 MG tablet Take 1 tablet (12.5 mg total) by mouth 3 (three) times daily as needed for dizziness. 60 tablet 1  . Multiple Vitamins-Minerals (MULTI-VITAMIN GUMMIES PO)     . nitroGLYCERIN (NITROSTAT) 0.4 MG SL tablet  Place 1 tablet (0.4 mg total) under the tongue every 5 (five) minutes as needed for chest pain. 25 tablet 3  . pantoprazole (PROTONIX) 40 MG tablet Take 1 tablet (40 mg total) by mouth every morning. 90 tablet 2  . repaglinide (PRANDIN) 2 MG tablet Take 1 tablet (2 mg total) by mouth 3 (three) times daily before meals. 270 tablet  3  . tamsulosin (FLOMAX) 0.4 MG CAPS capsule Take 1 capsule (0.4 mg total) by mouth daily. 90 capsule 3  . VENTOLIN HFA 108 (90 Base) MCG/ACT inhaler INHALE 2 PUFFS INTO THE LUNGS EVERY 4 HOURS AS NEEDED FOR WHEEZING OR SHORTNESS OF BREATH 18 g 5   No current facility-administered medications on file prior to visit.     Allergies  Allergen Reactions  . Enalapril     SOB  . Hydrocodone-Acetaminophen Nausea Only    Can take codein    Family History  Problem Relation Age of Onset  . Hypertension Mother   . Diabetes Father   . Cancer - Other Brother        cancer all over  . Coronary artery disease Other        1st degree male relative  . Hypertension Other   . Cancer Sister 75       ovarian ca  . Colon cancer Neg Hx     BP (!) 160/64 (BP Location: Left Arm, Patient Position: Sitting, Cuff Size: Large)   Pulse 72   Ht 5\' 11"  (1.803 m)   Wt 241 lb 3.2 oz (109.4 kg)   SpO2 97%   BMI 33.64 kg/m   Review of Systems He denies hypoglycemia    Objective:   Physical Exam VITAL SIGNS:  See vs page GENERAL: no distress Pulses: dorsalis pedis intact bilat.   MSK: no deformity of the feet CV: trace bilat leg edema Skin:  no ulcer on the feet.  normal color and temp on the feet.  Old healed surgical scar (vein harvest) on the left leg.   Neuro: sensation is intact to touch on the feet Ext: There is severe bilateral onychomycosis of the toenails  Lab Results  Component Value Date   HGBA1C 9.2 (A) 07/25/2018   Lab Results  Component Value Date   CREATININE 1.30 05/07/2018   BUN 21 05/07/2018   NA 137 05/07/2018   K 3.8 05/07/2018   CL 103  05/07/2018   CO2 27 05/07/2018       Assessment & Plan:  type 2 DM, with CAD: we discussed.  he declines to increase rx, including insulin.   HTN: recheck next time.  Renal insuff: This limits rx options and dosages.   Patient Instructions  Please continue the same medications.   check your blood sugar once a day.  vary the time of day when you check, between before the 3 meals, and at bedtime.  also check if you have symptoms of your blood sugar being too high or too low.  please keep a record of the readings and bring it to your next appointment here (or you can bring the meter itself).  You can write it on any piece of paper.  please call us sooner if your blood sugar goes below 70, or if you have a lot of readings over 200. Please come back for a follow-up appointment in 2 months.

## 2018-08-01 ENCOUNTER — Telehealth: Payer: Self-pay | Admitting: Internal Medicine

## 2018-08-01 NOTE — Telephone Encounter (Signed)
New message    [facility] for pt to return call about appt on 06.01.20. If pt returns call, please reach out via secure chat. I will speak to pt.

## 2018-08-02 NOTE — Telephone Encounter (Signed)
FOLLOW UP     Spoke w/pt about appt on 06.01.20 with Dr. Lovena Le. Pt changed to virtual visit via phone call, phone number is listed in appt notes.      Virtual Visit Pre-Appointment Phone Call  "(Name), I am calling you today to discuss your upcoming appointment. We are currently trying to limit exposure to the virus that causes COVID-19 by seeing patients at home rather than in the office."  1. "What is the BEST phone number to call the day of the visit?" - include this in appointment notes  2. Do you have or have access to (through a family member/friend) a smartphone with video capability that we can use for your visit?" a. If yes - list this number in appt notes as cell (if different from BEST phone #) and list the appointment type as a VIDEO visit in appointment notes b. If no - list the appointment type as a PHONE visit in appointment notes  3. Confirm consent - "In the setting of the current Covid19 crisis, you are scheduled for a (phone or video) visit with your provider on (date) at (time).  Just as we do with many in-office visits, in order for you to participate in this visit, we must obtain consent.  If you'd like, I can send this to your mychart (if signed up) or email for you to review.  Otherwise, I can obtain your verbal consent now.  All virtual visits are billed to your insurance company just like a normal visit would be.  By agreeing to a virtual visit, we'd like you to understand that the technology does not allow for your provider to perform an examination, and thus may limit your provider's ability to fully assess your condition. If your provider identifies any concerns that need to be evaluated in person, we will make arrangements to do so.  Finally, though the technology is pretty good, we cannot assure that it will always work on either your or our end, and in the setting of a video visit, we may have to convert it to a phone-only visit.  In either situation, we cannot  ensure that we have a secure connection.  Are you willing to proceed?" STAFF: Did the patient verbally acknowledge consent to telehealth visit? Document YES/NO here: YES  4. Advise patient to be prepared - "Two hours prior to your appointment, go ahead and check your blood pressure, pulse, oxygen saturation, and your weight (if you have the equipment to check those) and write them all down. When your visit starts, your provider will ask you for this information. If you have an Apple Watch or Kardia device, please plan to have heart rate information ready on the day of your appointment. Please have a pen and paper handy nearby the day of the visit as well."  5. Give patient instructions for MyChart download to smartphone OR Doximity/Doxy.me as below if video visit (depending on what platform provider is using)  6. Inform patient they will receive a phone call 15 minutes prior to their appointment time (may be from unknown caller ID) so they should be prepared to answer    TELEPHONE CALL NOTE  Johnny [name] has been deemed a candidate for a follow-up tele-health visit to limit community exposure during the Covid-19 pandemic. I spoke with the patient via phone to ensure availability of phone/video source, confirm preferred email & phone number, and discuss instructions and expectations.  I reminded Johnny [name] to be prepared with  any vital sign and/or heart rhythm information that could potentially be obtained via home monitoring, at the time of his visit. I reminded Johnny [name] to expect a phone call prior to his visit.  Johnny [name] 08/02/2018 11:12 AM   INSTRUCTIONS FOR DOWNLOADING THE MYCHART APP TO SMARTPHONE  - The patient must first make sure to have activated MyChart and know their login information - If Apple, go to [employer] and type in MyChart in the search bar and download the app. If Android, ask patient to go to [employer] and type in [facility] in the search bar  and download the app. The app is free but as with any other app downloads, their phone may require them to verify saved payment information or Apple/Android password.  - The patient will need to then log into the app with their MyChart username and password, and select [facility] as their healthcare provider to link the account. When it is time for your visit, go to the MyChart app, find appointments, and click Begin Video Visit. Be sure to Select Allow for your device to access the Microphone and Camera for your visit. You will then be connected, and your provider will be with you shortly.  **If they have any issues connecting, or need assistance please contact MyChart service desk [phone])**  **If using a computer, in order to ensure the best quality for their visit they will need to use either of the following Internet Browsers: [employer], or Google Chrome**  IF USING DOXIMITY or DOXY.ME - The patient will receive a link just prior to their visit by text.     FULL LENGTH CONSENT FOR TELE-HEALTH VISIT   I hereby voluntarily request, consent and authorize [facility] and its employed or contracted physicians, physician assistants, nurse practitioners or other licensed health care professionals (the Practitioner), to provide me with telemedicine health care services (the Services") as deemed necessary by the treating Practitioner. I acknowledge and consent to receive the Services by the Practitioner via telemedicine. I understand that the telemedicine visit will involve communicating with the Practitioner through live audiovisual communication technology and the disclosure of certain medical information by electronic transmission. I acknowledge that I have been given the opportunity to request an in-person assessment or other available alternative prior to the telemedicine visit and am voluntarily participating in the telemedicine visit.  I understand that I have the  right to withhold or withdraw my consent to the use of telemedicine in the course of my care at any time, without affecting my right to future care or treatment, and that the Practitioner or I may terminate the telemedicine visit at any time. I understand that I have the right to inspect all information obtained and/or recorded in the course of the telemedicine visit and may receive copies of available information for a reasonable fee.  I understand that some of the potential risks of receiving the Services via telemedicine include:   Delay or interruption in medical evaluation due to technological equipment failure or disruption;  Information transmitted may not be sufficient (e.g. poor resolution of images) to allow for appropriate medical decision making by the Practitioner; and/or   In rare instances, security protocols could fail, causing a breach of personal health information.  Furthermore, I acknowledge that it is my responsibility to provide information about my medical history, conditions and care that is complete and accurate to the best of my ability. I acknowledge that Practitioner's advice, recommendations, and/or  decision may be based on factors not within their control, such as incomplete or inaccurate data provided by me or distortions of diagnostic images or specimens that may result from electronic transmissions. I understand that the practice of medicine is not an exact science and that Practitioner makes no warranties or guarantees regarding treatment outcomes. I acknowledge that I will receive a copy of this consent concurrently upon execution via email to the email address I last provided but may also request a printed copy by calling the office of [facility].    I understand that my insurance will be billed for this visit.   I have read or had this consent read to me.  I understand the contents of this consent, which adequately explains the benefits and risks of the Services  being provided via telemedicine.   I have been provided ample opportunity to ask questions regarding this consent and the Services and have had my questions answered to my satisfaction.  I give my informed consent for the services to be provided through the use of telemedicine in my medical care  By participating in this telemedicine visit I agree to the above.

## 2018-08-06 ENCOUNTER — Other Ambulatory Visit: Payer: Self-pay

## 2018-08-06 ENCOUNTER — Telehealth (INDEPENDENT_AMBULATORY_CARE_PROVIDER_SITE_OTHER): Payer: Medicare Other | Admitting: Internal Medicine

## 2018-08-06 DIAGNOSIS — R079 Chest pain, unspecified: Secondary | ICD-10-CM | POA: Diagnosis not present

## 2018-08-06 DIAGNOSIS — Z951 Presence of aortocoronary bypass graft: Secondary | ICD-10-CM | POA: Diagnosis not present

## 2018-08-06 NOTE — Progress Notes (Signed)
Electrophysiology TeleHealth Note   Due to national recommendations of social distancing due to COVID 19, an audio/video telehealth visit is felt to be most appropriate for this patient at this time.  See MyChart message from today for the patient's consent to telehealth for Johnny [name].   Date:  08/06/2018   ID:  Johnny [name], DOB 16-Apr-1945, MRN [id number]  Location: patient's home  Provider location: [address] [place]  Evaluation Performed: Follow-up visit  PCP:  Plotnikov, Evie Lacks, MD  Cardiologist:  No primary care provider on file.  Electrophysiologist:  Dr Lovena Le  Chief Complaint:  "I been doing pretty good."  History of Present Illness:    Johnny [name] is a 73 y.o. male who presents via audio/video conferencing for a telehealth visit today.  Since last being seen in our clinic, the patient reports doing very well. He has a h/o chronic chest pain, HTN, and obesity. Today, he denies symptoms of palpitations, chest pain, shortness of breath,  lower extremity edema, dizziness, presyncope, or syncope.  The patient is otherwise without complaint today.  The patient denies symptoms of fevers, chills, cough, or new SOB worrisome for COVID 19.  Past Medical History:  Diagnosis Date  . Arthritis   . At risk for sleep apnea    STOP-BANG=  6   SENT TO PCP 05-21-2013  . Benign positional vertigo   . Benign prostatic hypertrophy   . Blood transfusion without reported diagnosis 2000  . CAD (coronary artery disease)    CARDIOLOGIST--  DR Cristopher Peru  . CHF (congestive heart failure) ([facility])   . Chronic ischemic heart disease, unspecified   . ED (erectile dysfunction) of organic origin   . GERD (gastroesophageal reflux disease)   . History of CHF (congestive heart failure)    systolic  . HTN (hypertension)   . Hyperlipidemia   . Type 2 diabetes mellitus ([facility])   . Wears glasses     Past Surgical History:  Procedure Laterality Date  . CARDIAC  CATHETERIZATION  03-03-2000  dr gregg taylor   normal lvsf/  two-vessel cad significant complex stenosis at distal left main  . CARDIOVASCULAR STRESS TEST  08-09-2010  DR Carleene Overlie TAYLOR   normal lexiscan nuclear study/  no ischemia/  normal lvf/  ef 54%  . CATARACT EXTRACTION W/ INTRAOCULAR LENS IMPLANT Right 2011  . COLONOSCOPY  12-30-2009  . CORONARY ARTERY BYPASS GRAFT  03-06-2000   DR [id number]   third vessel  . PENILE PROSTHESIS IMPLANT  02-08-2010   COLOPLAST 3-PIECE INFLATABLE  . PENILE PROSTHESIS IMPLANT N/A 05/27/2013   Procedure: CYSTO REMOVAL OF PENILE PROSTHESIS;  Surgeon: Claybon Jabs, MD;  Location[facility];  Service: Urology;  Laterality: N/A;  . POLYPECTOMY  12-30-2009   +TA  . TOTAL HIP ARTHROPLASTY  01/24/2011   Procedure: TOTAL HIP ARTHROPLASTY;  Surgeon: Dione Plover [name];  Location: [facility];  Service: Orthopedics;  Laterality: Left;  . TOTAL HIP ARTHROPLASTY Right 01/30/2013   Procedure: RIGHT TOTAL HIP ARTHROPLASTY;  Surgeon: Gearlean Alf, MD;  Location: [facility];  Service: Orthopedics;  Laterality: Right;    Current Outpatient Medications  Medication Sig Dispense Refill  . Alcohol Swabs (B-D SINGLE USE SWABS REGULAR) PADS 1 each by Does not apply route 2 (two) times daily as needed. Dx: 250.00 100 each 3  . allopurinol (ZYLOPRIM) 100 MG tablet Take 1 tablet (100 mg total) by mouth daily. 90 tablet 3  .  aspirin 325 MG EC tablet Take 325 mg by mouth every morning.    . Blood Glucose Calibration (ACCU-CHEK AVIVA) SOLN Use as directed as needed. 1 each 2  . bromocriptine (PARLODEL) 2.5 MG tablet Take 1 tablet (2.5 mg total) by mouth daily. 30 tablet 11  . cholecalciferol (VITAMIN D) 1000 UNITS tablet Take 1,000 Units by mouth every morning.    . colchicine 0.6 MG tablet Take 1 tablet (0.6 mg total) by mouth daily. 90 tablet 3  . diazepam (VALIUM) 2 MG tablet Take 1-2 tablets (2-4 mg total) by mouth every 6 (six) hours as needed. Prn vertigo 60 tablet 0   . diclofenac (VOLTAREN) 75 MG EC tablet TAKE 1 TABLET BY MOUTH TWICE DAILY AS NEEDED FOR MODERATE PAIN (GOUT  ATTACK) 90 tablet 0  . finasteride (PROSCAR) 5 MG tablet Take 1 tablet (5 mg total) by mouth every morning. 90 tablet 2  . fluticasone furoate-vilanterol (BREO ELLIPTA) 100-25 MCG/INH AEPB Inhale 1 puff into the lungs daily. 1 each 5  . glucose blood test strip Use twice daily as directed. Dx: 250.00 200 each 3  . irbesartan-hydrochlorothiazide (AVALIDE) 300-12.5 MG tablet Take 1 tablet by mouth daily. 90 tablet 2  . Lancet Devices (ACCU-CHEK SOFTCLIX) lancets Use twice daily as instructed. Dx: 250.00 1 each 3  . lovastatin (MEVACOR) 40 MG tablet Take 1 tablet (40 mg total) by mouth every morning. 90 tablet 2  . meclizine (ANTIVERT) 12.5 MG tablet Take 1 tablet (12.5 mg total) by mouth 3 (three) times daily as needed for dizziness. 60 tablet 1  . metFORMIN (GLUCOPHAGE-XR) 500 MG 24 hr tablet Take 1 tablet (500 mg total) by mouth daily with breakfast. 90 tablet 3  . Multiple Vitamins-Minerals (MULTI-VITAMIN GUMMIES PO)     . nitroGLYCERIN (NITROSTAT) 0.4 MG SL tablet Place 1 tablet (0.4 mg total) under the tongue every 5 (five) minutes as needed for chest pain. 25 tablet 3  . pantoprazole (PROTONIX) 40 MG tablet Take 1 tablet (40 mg total) by mouth every morning. 90 tablet 2  . repaglinide (PRANDIN) 2 MG tablet Take 1 tablet (2 mg total) by mouth 3 (three) times daily before meals. 270 tablet 3  . tamsulosin (FLOMAX) 0.4 MG CAPS capsule Take 1 capsule (0.4 mg total) by mouth daily. 90 capsule 3  . VENTOLIN HFA 108 (90 Base) MCG/ACT inhaler INHALE 2 PUFFS INTO THE LUNGS EVERY 4 HOURS AS NEEDED FOR WHEEZING OR SHORTNESS OF BREATH 18 g 5   No current facility-administered medications for this visit.     Allergies:   Enalapril and Hydrocodone-acetaminophen   Social History:  The patient  reports that he quit smoking about 48 years ago. His smoking use included cigarettes. He has never used  smokeless tobacco. He reports that he does not drink alcohol or use drugs.   Family History:  The patient's  family history includes Cancer (age of onset: 12) in his sister; Cancer - Other in his brother; Coronary artery disease in an other family member; Diabetes in his father; Hypertension in his mother and another family member.   ROS:  Please see the history of present illness.   All other systems are personally reviewed and negative.    Exam:    Vital Signs:  There were no vitals taken for this visit. Wt. - 240 lb, BP - 113/60, P - 66, oxygen sat 96%, BMI - 32   Labs/Other Tests and Data Reviewed:    Recent Labs: 05/07/2018: ALT 14;  BUN 21; Creatinine, Ser 1.30; Potassium 3.8; Sodium 137   Wt Readings from Last 3 Encounters:  07/25/18 241 lb 3.2 oz (109.4 kg)  05/25/18 234 lb 3.2 oz (106.2 kg)  05/07/18 238 lb (108 kg)     Other studies personally reviewed:   ASSESSMENT & PLAN:    1.  Chest pain - he has not been exercising. He denies anginal symptoms. 2. HTN - his bp is improved. 3. Obesity - he has lost weight. He is encouraged to lose more.    COVID 19 screen The patient denies symptoms of COVID 19 at this time.  The importance of social distancing was discussed today.  Follow-up:  6 months with me Next remote: n/a  Current medicines are reviewed at length with the patient today.   The patient does not have concerns regarding his medicines.  The following changes were made today:  none  Labs/ tests ordered today include: none No orders of the defined types were placed in this encounter.    Patient Risk:  after full review of this patients clinical status, I feel that they are at moderate risk at this time.  Today, I have spent 15 minutes with the patient with telehealth technology discussing all of the above .    Signed, Cristopher Peru, MD  08/06/2018 12:02 PM     [facility] [address] [place] [address] [phone] (office)  [phone] (fax)

## 2018-09-06 ENCOUNTER — Ambulatory Visit: Payer: Medicare Other | Admitting: Internal Medicine

## 2018-09-26 ENCOUNTER — Ambulatory Visit (INDEPENDENT_AMBULATORY_CARE_PROVIDER_SITE_OTHER): Payer: Medicare Other | Admitting: Endocrinology

## 2018-09-26 ENCOUNTER — Ambulatory Visit: Payer: Medicare Other | Admitting: Endocrinology

## 2018-09-26 ENCOUNTER — Encounter: Payer: Self-pay | Admitting: Endocrinology

## 2018-09-26 ENCOUNTER — Other Ambulatory Visit: Payer: Self-pay

## 2018-09-26 DIAGNOSIS — E1159 Type 2 diabetes mellitus with other circulatory complications: Secondary | ICD-10-CM | POA: Diagnosis not present

## 2018-09-26 MED ORDER — METFORMIN HCL 500 MG PO TABS: 500.0000 mg | ORAL_TABLET | Freq: Every day | ORAL | 3 refills | Status: DC

## 2018-09-26 MED ORDER — REPAGLINIDE 2 MG PO TABS: 2.0000 mg | ORAL_TABLET | Freq: Three times a day (TID) | ORAL | 3 refills | Status: DC

## 2018-09-26 NOTE — Patient Instructions (Signed)
Please continue the same medications check your blood sugar once a day.  vary the time of day when you check, between before the 3 meals, and at bedtime.  also check if you have symptoms of your blood sugar being too high or too low.  please keep a record of the readings and bring it to your next appointment here (or you can bring the meter itself).  You can write it on any piece of paper.  please call us sooner if your blood sugar goes below 70, or if you have a lot of readings over 200. Please come back for a follow-up appointment in 1 month, in person.

## 2018-09-26 NOTE — Progress Notes (Signed)
Subjective:    Patient ID: Johnny [name], male    DOB: [birth year], 73 y.o.   MRN: [id number]  HPI  telehealth visit today via phone x 6 minutes.   Alternatives to telehealth are presented to this patient, and the patient agrees to the telehealth visit.    Pt is advised of the cost of the visit, and agrees to this, also.   Patient is at home, and I am at the office.   Persons attending the telehealth visit: the patient and I.   Pt returns for f/u of diabetes mellitus:  DM type: 2 Dx'ed: 4970 Complications: CAD and renal insuff.   Therapy: 3 oral meds.  DKA: never Severe hypoglycemia: never Pancreatitis: never Pancreatic imaging: never Other: he has never been on insulin, except in the hospital, but he has learned how to take.  edema and renal insuff limit rx options and dosages; he cannot afford name brand meds.   Interval history: pt states he feels well in general.  He says cbg's are in the mid to high-100's.  He takes meds as rx'ed, except he sometimes misses the repaglinide.   Past Medical History:  Diagnosis Date  . Arthritis   . At risk for sleep apnea    STOP-BANG=  6   SENT TO PCP 05-21-2013  . Benign positional vertigo   . Benign prostatic hypertrophy   . Blood transfusion without reported diagnosis 2000  . CAD (coronary artery disease)    CARDIOLOGIST--  DR Cristopher Peru  . CHF (congestive heart failure) ([place])   . Chronic ischemic heart disease, unspecified   . ED (erectile dysfunction) of organic origin   . GERD (gastroesophageal reflux disease)   . History of CHF (congestive heart failure)    systolic  . HTN (hypertension)   . Hyperlipidemia   . Type 2 diabetes mellitus ([place])   . Wears glasses     Past Surgical History:  Procedure Laterality Date  . CARDIAC CATHETERIZATION  03-03-2000  dr gregg taylor   normal lvsf/  two-vessel cad significant complex stenosis at distal left main  . CARDIOVASCULAR STRESS TEST  08-09-2010  DR Carleene Overlie TAYLOR   normal lexiscan  nuclear study/  no ischemia/  normal lvf/  ef 54%  . CATARACT EXTRACTION W/ INTRAOCULAR LENS IMPLANT Right 2011  . COLONOSCOPY  12-30-2009  . CORONARY ARTERY BYPASS GRAFT  03-06-2000   DR YOVZCHYI   third vessel  . PENILE PROSTHESIS IMPLANT  02-08-2010   COLOPLAST 3-PIECE INFLATABLE  . PENILE PROSTHESIS IMPLANT N/A 05/27/2013   Procedure: CYSTO REMOVAL OF PENILE PROSTHESIS;  Surgeon: Claybon Jabs, MD;  [facility]: [facility];  Service: Urology;  Laterality: N/A;  . POLYPECTOMY  12-30-2009   +TA  . TOTAL HIP ARTHROPLASTY  01/24/2011   Procedure: TOTAL HIP ARTHROPLASTY;  Surgeon: Dione Plover [name];  Location: [facility];  Service: Orthopedics;  Laterality: Left;  . TOTAL HIP ARTHROPLASTY Right 01/30/2013   Procedure: RIGHT TOTAL HIP ARTHROPLASTY;  Surgeon: Gearlean Alf, MD;  Location: [facility];  Service: Orthopedics;  Laterality: Right;    Social History   Socioeconomic History  . Marital status: Married    Spouse name: Not on file  . Number of children: Not on file  . Years of education: Not on file  . Highest education level: Not on file  Occupational History  . Occupation: Engineer, building services: RETIRED  Social Needs  . Financial resource strain: Not on file  .  Food insecurity    Worry: Not on file    Inability: Not on file  . Transportation needs    Medical: Not on file    Non-medical: Not on file  Tobacco Use  . Smoking status: Former Smoker    Types: Cigarettes    Quit date: 05/22/1970    Years since quitting: 48.3  . Smokeless tobacco: Never Used  Substance and Sexual Activity  . Alcohol use: No    Alcohol/week: 0.0 standard drinks  . Drug use: No  . Sexual activity: Not on file  Lifestyle  . Physical activity    Days per week: Not on file    Minutes per session: Not on file  . Stress: Not on file  Relationships  . Social Herbalist on phone: Not on file    Gets together: Not on file    Attends religious service: Not on file    Active  member of club or organization: Not on file    Attends meetings of clubs or organizations: Not on file    Relationship status: Not on file  . Intimate partner violence    Fear of current or ex partner: Not on file    Emotionally abused: Not on file    Physically abused: Not on file    Forced sexual activity: Not on file  Other Topics Concern  . Not on file  Social History Narrative   Regular Exercise-No          Current Outpatient Medications on File Prior to Visit  Medication Sig Dispense Refill  . Alcohol Swabs (B-D SINGLE USE SWABS REGULAR) PADS 1 each by Does not apply route 2 (two) times daily as needed. Dx: 250.00 100 each 3  . allopurinol (ZYLOPRIM) 100 MG tablet Take 1 tablet (100 mg total) by mouth daily. 90 tablet 3  . aspirin 325 MG EC tablet Take 325 mg by mouth every morning.    . Blood Glucose Calibration (ACCU-CHEK AVIVA) SOLN Use as directed as needed. 1 each 2  . bromocriptine (PARLODEL) 2.5 MG tablet Take 1 tablet (2.5 mg total) by mouth daily. 30 tablet 11  . cholecalciferol (VITAMIN D) 1000 UNITS tablet Take 1,000 Units by mouth every morning.    . colchicine 0.6 MG tablet Take 1 tablet (0.6 mg total) by mouth daily. 90 tablet 3  . diazepam (VALIUM) 2 MG tablet Take 1-2 tablets (2-4 mg total) by mouth every 6 (six) hours as needed. Prn vertigo 60 tablet 0  . diclofenac (VOLTAREN) 75 MG EC tablet TAKE 1 TABLET BY MOUTH TWICE DAILY AS NEEDED FOR MODERATE PAIN (GOUT  ATTACK) 90 tablet 0  . finasteride (PROSCAR) 5 MG tablet Take 1 tablet (5 mg total) by mouth every morning. 90 tablet 2  . fluticasone furoate-vilanterol (BREO ELLIPTA) 100-25 MCG/INH AEPB Inhale 1 puff into the lungs daily. 1 each 5  . glucose blood test strip Use twice daily as directed. Dx: 250.00 200 each 3  . irbesartan-hydrochlorothiazide (AVALIDE) 300-12.5 MG tablet Take 1 tablet by mouth daily. 90 tablet 2  . Lancet Devices (ACCU-CHEK SOFTCLIX) lancets Use twice daily as instructed. Dx: 250.00 1  each 3  . lovastatin (MEVACOR) 40 MG tablet Take 1 tablet (40 mg total) by mouth every morning. 90 tablet 2  . meclizine (ANTIVERT) 12.5 MG tablet Take 1 tablet (12.5 mg total) by mouth 3 (three) times daily as needed for dizziness. 60 tablet 1  . Multiple Vitamins-Minerals (MULTI-VITAMIN GUMMIES PO)     .  nitroGLYCERIN (NITROSTAT) 0.4 MG SL tablet Place 1 tablet (0.4 mg total) under the tongue every 5 (five) minutes as needed for chest pain. 25 tablet 3  . pantoprazole (PROTONIX) 40 MG tablet Take 1 tablet (40 mg total) by mouth every morning. 90 tablet 2  . tamsulosin (FLOMAX) 0.4 MG CAPS capsule Take 1 capsule (0.4 mg total) by mouth daily. 90 capsule 3  . VENTOLIN HFA 108 (90 Base) MCG/ACT inhaler INHALE 2 PUFFS INTO THE LUNGS EVERY 4 HOURS AS NEEDED FOR WHEEZING OR SHORTNESS OF BREATH 18 g 5   No current facility-administered medications on file prior to visit.     Allergies  Allergen Reactions  . Enalapril     SOB  . Hydrocodone-Acetaminophen Nausea Only    Can take codein    Family History  Problem Relation Age of Onset  . Hypertension Mother   . Diabetes Father   . Cancer - Other Brother        cancer all over  . Coronary artery disease Other        1st degree male relative  . Hypertension Other   . Cancer Sister 34       ovarian ca  . Colon cancer Neg Hx     There were no vitals taken for this visit.   Review of Systems He denies hypoglycemia.      Objective:   Physical Exam    Lab Results  Component Value Date   HGBA1C 9.2 (A) 07/25/2018   Lab Results  Component Value Date   CREATININE 1.30 05/07/2018   BUN 21 05/07/2018   NA 137 05/07/2018   K 3.8 05/07/2018   CL 103 05/07/2018   CO2 27 05/07/2018       Assessment & Plan:  Type 2 DM, with renal insuff: I advised him to add another medication, but he declines.   Patient Instructions  Please continue the same medications check your blood sugar once a day.  vary the time of day when you check,  between before the 3 meals, and at bedtime.  also check if you have symptoms of your blood sugar being too high or too low.  please keep a record of the readings and bring it to your next appointment here (or you can bring the meter itself).  You can write it on any piece of paper.  please call us sooner if your blood sugar goes below 70, or if you have a lot of readings over 200. Please come back for a follow-up appointment in 1 month, in person.

## 2018-09-27 ENCOUNTER — Ambulatory Visit: Payer: Medicare Other | Admitting: Internal Medicine

## 2018-10-01 ENCOUNTER — Other Ambulatory Visit (INDEPENDENT_AMBULATORY_CARE_PROVIDER_SITE_OTHER): Payer: Medicare Other

## 2018-10-01 ENCOUNTER — Other Ambulatory Visit: Payer: Self-pay

## 2018-10-01 ENCOUNTER — Ambulatory Visit (INDEPENDENT_AMBULATORY_CARE_PROVIDER_SITE_OTHER): Payer: Medicare Other | Admitting: Internal Medicine

## 2018-10-01 ENCOUNTER — Encounter: Payer: Self-pay | Admitting: Internal Medicine

## 2018-10-01 DIAGNOSIS — M7022 Olecranon bursitis, left elbow: Secondary | ICD-10-CM | POA: Diagnosis not present

## 2018-10-01 DIAGNOSIS — M10079 Idiopathic gout, unspecified ankle and foot: Secondary | ICD-10-CM

## 2018-10-01 DIAGNOSIS — E1159 Type 2 diabetes mellitus with other circulatory complications: Secondary | ICD-10-CM | POA: Diagnosis not present

## 2018-10-01 DIAGNOSIS — M7021 Olecranon bursitis, right elbow: Secondary | ICD-10-CM

## 2018-10-01 DIAGNOSIS — I10 Essential (primary) hypertension: Secondary | ICD-10-CM | POA: Diagnosis not present

## 2018-10-01 DIAGNOSIS — R42 Dizziness and giddiness: Secondary | ICD-10-CM | POA: Insufficient documentation

## 2018-10-01 LAB — BASIC METABOLIC PANEL
BUN: 21 mg/dL (ref 6–23)
CO2: 25 mEq/L (ref 19–32)
Calcium: 9.3 mg/dL (ref 8.4–10.5)
Chloride: 103 mEq/L (ref 96–112)
Creatinine, Ser: 1.39 mg/dL (ref 0.40–1.50)
GFR: 60.45 mL/min (ref >60.00)
Glucose, Bld: 232 mg/dL — ABNORMAL HIGH (ref 70–99)
Potassium: 4.1 mEq/L (ref 3.5–5.1)
Sodium: 137 mEq/L (ref 135–145)

## 2018-10-01 LAB — HEMOGLOBIN A1C: Hgb A1c MFr Bld: 9.6 % — ABNORMAL HIGH (ref 4.6–6.5)

## 2018-10-01 LAB — URIC ACID: Uric Acid, Serum: 8.9 mg/dL — ABNORMAL HIGH (ref 4.0–7.8)

## 2018-10-01 NOTE — Assessment & Plan Note (Signed)
No relapse 

## 2018-10-01 NOTE — Assessment & Plan Note (Signed)
Metformin, Prandin, Bromocriptine; Irbesartan HCT and Lovastatin

## 2018-10-01 NOTE — Assessment & Plan Note (Signed)
Discussed  No pain

## 2018-10-01 NOTE — Assessment & Plan Note (Signed)
Irbesartan HCT 

## 2018-10-01 NOTE — Progress Notes (Signed)
Subjective:  Patient ID: Johnny [name], male    DOB: 1945-10-22  Age: 73 y.o. MRN: [id number]  CC: No chief complaint on file.   HPI Alvin Diffee Turrell presents for lightheadedness, DM, HTN.  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 it is been very hot lately  Outpatient Medications Prior to Visit  Medication Sig Dispense Refill  . Alcohol Swabs (B-D SINGLE USE SWABS REGULAR) PADS 1 each by Does not apply route 2 (two) times daily as needed. Dx: 250.00 100 each 3  . allopurinol (ZYLOPRIM) 100 MG tablet Take 1 tablet (100 mg total) by mouth daily. 90 tablet 3  . aspirin 325 MG EC tablet Take 325 mg by mouth every morning.    . Blood Glucose Calibration (ACCU-CHEK AVIVA) SOLN Use as directed as needed. 1 each 2  . bromocriptine (PARLODEL) 2.5 MG tablet Take 1 tablet (2.5 mg total) by mouth daily. 30 tablet 11  . cholecalciferol (VITAMIN D) 1000 UNITS tablet Take 1,000 Units by mouth every morning.    . colchicine 0.6 MG tablet Take 1 tablet (0.6 mg total) by mouth daily. 90 tablet 3  . diazepam (VALIUM) 2 MG tablet Take 1-2 tablets (2-4 mg total) by mouth every 6 (six) hours as needed. Prn vertigo 60 tablet 0  . diclofenac (VOLTAREN) 75 MG EC tablet TAKE 1 TABLET BY MOUTH TWICE DAILY AS NEEDED FOR MODERATE PAIN (GOUT  ATTACK) 90 tablet 0  . finasteride (PROSCAR) 5 MG tablet Take 1 tablet (5 mg total) by mouth every morning. 90 tablet 2  . fluticasone furoate-vilanterol (BREO ELLIPTA) 100-25 MCG/INH AEPB Inhale 1 puff into the lungs daily. 1 each 5  . glucose blood test strip Use twice daily as directed. Dx: 250.00 200 each 3  . irbesartan-hydrochlorothiazide (AVALIDE) 300-12.5 MG tablet Take 1 tablet by mouth daily. 90 tablet 2  . Lancet Devices (ACCU-CHEK SOFTCLIX) lancets Use twice daily as instructed. Dx: 250.00 1 each 3  . lovastatin (MEVACOR) 40 MG tablet Take 1 tablet (40 mg total) by mouth every morning. 90 tablet  2  . meclizine (ANTIVERT) 12.5 MG tablet Take 1 tablet (12.5 mg total) by mouth 3 (three) times daily as needed for dizziness. 60 tablet 1  . metFORMIN (GLUCOPHAGE) 500 MG tablet Take 1 tablet (500 mg total) by mouth daily with breakfast. 90 tablet 3  . Multiple Vitamins-Minerals (MULTI-VITAMIN GUMMIES PO)     . nitroGLYCERIN (NITROSTAT) 0.4 MG SL tablet Place 1 tablet (0.4 mg total) under the tongue every 5 (five) minutes as needed for chest pain. 25 tablet 3  . pantoprazole (PROTONIX) 40 MG tablet Take 1 tablet (40 mg total) by mouth every morning. 90 tablet 2  . repaglinide (PRANDIN) 2 MG tablet Take 1 tablet (2 mg total) by mouth 3 (three) times daily before meals. 270 tablet 3  . tamsulosin (FLOMAX) 0.4 MG CAPS capsule Take 1 capsule (0.4 mg total) by mouth daily. 90 capsule 3  . VENTOLIN HFA 108 (90 Base) MCG/ACT inhaler INHALE 2 PUFFS INTO THE LUNGS EVERY 4 HOURS AS NEEDED FOR WHEEZING OR SHORTNESS OF BREATH 18 g 5   No facility-administered medications prior to visit.     ROS: Review of Systems  Constitutional: Negative for appetite change, fatigue and unexpected weight change.  HENT: Negative for congestion, nosebleeds, sneezing, sore throat and trouble swallowing.   Eyes: Negative for itching and visual disturbance.  Respiratory: Negative for cough.  Cardiovascular: Negative for chest pain, palpitations and leg swelling.  Gastrointestinal: Negative for abdominal distention, blood in stool, diarrhea and nausea.  Genitourinary: Negative for frequency and hematuria.  Musculoskeletal: Negative for back pain, gait problem, joint swelling and neck pain.  Skin: Negative for rash.  Neurological: Positive for light-headedness. Negative for dizziness, tremors, speech difficulty and weakness.  Psychiatric/Behavioral: Negative for agitation, dysphoric mood and sleep disturbance. The patient is not nervous/anxious.     Objective:  BP 130/70 (BP Location: Left Arm, Patient Position:  Sitting, Cuff Size: Large)   Pulse 72   Temp 98.6 F (37 C) (Oral)   Ht 5\' 11"  (1.803 m)   Wt 236 lb (107 kg)   SpO2 97%   BMI 32.92 kg/m   BP Readings from Last 3 Encounters:  10/01/18 130/70  07/25/18 (!) 160/64  05/25/18 114/60    Wt Readings from Last 3 Encounters:  10/01/18 236 lb (107 kg)  07/25/18 241 lb 3.2 oz (109.4 kg)  05/25/18 234 lb 3.2 oz (106.2 kg)    Physical Exam Constitutional:      General: He is not in acute distress.    Appearance: He is well-developed.     Comments: NAD  Eyes:     Conjunctiva/sclera: Conjunctivae normal.     Pupils: Pupils are equal, round, and reactive to light.  Neck:     Musculoskeletal: Normal range of motion.     Thyroid: No thyromegaly.     Vascular: No JVD.  Cardiovascular:     Rate and Rhythm: Normal rate and regular rhythm.     Heart sounds: Normal heart sounds. No murmur. No friction rub. No gallop.   Pulmonary:     Effort: Pulmonary effort is normal. No respiratory distress.     Breath sounds: Normal breath sounds. No wheezing or rales.  Chest:     Chest Denne: No tenderness.  Abdominal:     General: Bowel sounds are normal. There is no distension.     Palpations: Abdomen is soft. There is no mass.     Tenderness: There is no abdominal tenderness. There is no guarding or rebound.  Musculoskeletal: Normal range of motion.        General: No tenderness.  Lymphadenopathy:     Cervical: No cervical adenopathy.  Skin:    General: Skin is warm and dry.     Findings: No rash.  Neurological:     Mental Status: He is alert and oriented to person, place, and time.     Cranial Nerves: No cranial nerve deficit.     Motor: No abnormal muscle tone.     Coordination: Coordination normal.     Gait: Gait normal.     Deep Tendon Reflexes: Reflexes are normal and symmetric.  Psychiatric:        Behavior: Behavior normal.        Thought Content: Thought content normal.        Judgment: Judgment normal.   He is obese Both  elbows with elbow bursitis right more than left, nontender Lab Results  Component Value Date   WBC 5.8 07/20/2013   HGB 11.4 (L) 07/20/2013   HCT 35.3 (L) 07/20/2013   PLT 258 07/20/2013   GLUCOSE 165 (H) 05/07/2018   CHOL 167 09/09/2016   TRIG 371.0 (H) 09/09/2016   HDL 31.40 (L) 09/09/2016   LDLDIRECT 73.0 09/09/2016   LDLCALC 74 11/29/2013   ALT 14 05/07/2018   AST 10 05/07/2018   NA 137 05/07/2018  K 3.8 05/07/2018   CL 103 05/07/2018   CREATININE 1.30 05/07/2018   BUN 21 05/07/2018   CO2 27 05/07/2018   TSH 3.59 10/14/2010   PSA 0.99 09/09/2016   INR 1.15 01/21/2013   HGBA1C 9.2 (A) 07/25/2018   MICROALBUR <0.7 09/09/2016    Dg Chest 2 View  Result Date: 07/14/2015 CLINICAL DATA:  Shortness of breath and wheezing for 2 weeks EXAM: CHEST  2 VIEW COMPARISON:  07/20/2013 FINDINGS: Cardiac shadow is stable. Postsurgical changes are again seen. The lungs are well aerated bilaterally without evidence of focal infiltrate or sizable effusion. Moderate degenerative changes of thoracic spine are noted. IMPRESSION: No active cardiopulmonary disease. Electronically Signed   By: Inez Catalina M.D.   On: 07/14/2015 08:25    Assessment & Plan:    Walker Kehr, MD

## 2018-10-01 NOTE — Assessment & Plan Note (Signed)
Likely due to overheating and dehydration.  Drink more water.  Avoid overheating outside or in non-air conditioned rooms.

## 2018-10-01 NOTE — Assessment & Plan Note (Signed)
Wt Readings from Last 3 Encounters:  10/01/18 236 lb (107 kg)  07/25/18 241 lb 3.2 oz (109.4 kg)  05/25/18 234 lb 3.2 oz (106.2 kg)

## 2018-10-26 ENCOUNTER — Other Ambulatory Visit: Payer: Self-pay

## 2018-10-29 ENCOUNTER — Encounter: Payer: Self-pay | Admitting: Endocrinology

## 2018-10-29 ENCOUNTER — Ambulatory Visit (INDEPENDENT_AMBULATORY_CARE_PROVIDER_SITE_OTHER): Payer: Medicare Other | Admitting: Endocrinology

## 2018-10-29 ENCOUNTER — Other Ambulatory Visit: Payer: Self-pay

## 2018-10-29 VITALS — BP 104/50 | HR 60 | Ht 71.0 in | Wt 235.8 lb

## 2018-10-29 DIAGNOSIS — E1159 Type 2 diabetes mellitus with other circulatory complications: Secondary | ICD-10-CM

## 2018-10-29 NOTE — Patient Instructions (Addendum)
A different type of diabetes blood test is requested for you today.  We'll let you know about the results.  check your blood sugar once a day.  vary the time of day when you check, between before the 3 meals, and at bedtime.  also check if you have symptoms of your blood sugar being too high or too low.  please keep a record of the readings and bring it to your next appointment here (or you can bring the meter itself).  You can write it on any piece of paper.  please call us sooner if your blood sugar goes below 70, or if you have a lot of readings over 200. Please come back for a follow-up appointment in 2 months.

## 2018-10-29 NOTE — Progress Notes (Signed)
Subjective:    Patient ID: Johnny [name], male    DOB: [birth year], 73 y.o.   MRN: [id number]  HPI Pt returns for f/u of diabetes mellitus:  DM type: 2 Dx'ed: Q000111Q Complications: CAD and renal insuff.   Therapy: 3 oral meds.  DKA: never Severe hypoglycemia: never Pancreatitis: never Pancreatic imaging: never Other: he has never been on insulin, except in the hospital, but he has learned how to take.  edema and renal insuff limit rx options and dosages; he cannot afford name brand meds.   Interval history: pt states he feels well in general.  He says cbg's vary from 86-130.  He takes meds as rx'ed, except he seldom misses the repaglinide.   Past Medical History:  Diagnosis Date  . Arthritis   . At risk for sleep apnea    STOP-BANG=  6   SENT TO PCP 05-21-2013  . Benign positional vertigo   . Benign prostatic hypertrophy   . Blood transfusion without reported diagnosis 2000  . CAD (coronary artery disease)    CARDIOLOGIST--  DR Cristopher Peru  . CHF (congestive heart failure) ([place])   . Chronic ischemic heart disease, unspecified   . ED (erectile dysfunction) of organic origin   . GERD (gastroesophageal reflux disease)   . History of CHF (congestive heart failure)    systolic  . HTN (hypertension)   . Hyperlipidemia   . Type 2 diabetes mellitus ([place])   . Wears glasses     Past Surgical History:  Procedure Laterality Date  . CARDIAC CATHETERIZATION  03-03-2000  dr gregg taylor   normal lvsf/  two-vessel cad significant complex stenosis at distal left main  . CARDIOVASCULAR STRESS TEST  08-09-2010  DR Carleene Overlie TAYLOR   normal lexiscan nuclear study/  no ischemia/  normal lvf/  ef 54%  . CATARACT EXTRACTION W/ INTRAOCULAR LENS IMPLANT Right 2011  . COLONOSCOPY  12-30-2009  . CORONARY ARTERY BYPASS GRAFT  03-06-2000   DR [id number]   third vessel  . PENILE PROSTHESIS IMPLANT  02-08-2010   COLOPLAST 3-PIECE INFLATABLE  . PENILE PROSTHESIS IMPLANT N/A 05/27/2013   Procedure:  CYSTO REMOVAL OF PENILE PROSTHESIS;  Surgeon: Claybon Jabs, MD;  Location: [facility];  Service: Urology;  Laterality: N/A;  . POLYPECTOMY  12-30-2009   +TA  . TOTAL HIP ARTHROPLASTY  01/24/2011   Procedure: TOTAL HIP ARTHROPLASTY;  Surgeon: Dione Plover [name];  Location: [facility];  Service: Orthopedics;  Laterality: Left;  . TOTAL HIP ARTHROPLASTY Right 01/30/2013   Procedure: RIGHT TOTAL HIP ARTHROPLASTY;  Surgeon: Gearlean Alf, MD;  Location: [facility];  Service: Orthopedics;  Laterality: Right;    Social History   Socioeconomic History  . Marital status: Married    Spouse name: Not on file  . Number of children: Not on file  . Years of education: Not on file  . Highest education level: Not on file  Occupational History  . Occupation: Engineer, building services: RETIRED  Social Needs  . Financial resource strain: Not on file  . Food insecurity    Worry: Not on file    Inability: Not on file  . Transportation needs    Medical: Not on file    Non-medical: Not on file  Tobacco Use  . Smoking status: Former Smoker    Types: Cigarettes    Quit date: 05/22/1970    Years since quitting: 48.4  . Smokeless tobacco: Never Used  Substance and Sexual Activity  .  Alcohol use: No    Alcohol/week: 0.0 standard drinks  . Drug use: No  . Sexual activity: Not on file  Lifestyle  . Physical activity    Days per week: Not on file    Minutes per session: Not on file  . Stress: Not on file  Relationships  . Social Herbalist on phone: Not on file    Gets together: Not on file    Attends religious service: Not on file    Active member of club or organization: Not on file    Attends meetings of clubs or organizations: Not on file    Relationship status: Not on file  . Intimate partner violence    Fear of current or ex partner: Not on file    Emotionally abused: Not on file    Physically abused: Not on file    Forced sexual activity: Not on file  Other Topics  Concern  . Not on file  Social History Narrative   Regular Exercise-No          Current Outpatient Medications on File Prior to Visit  Medication Sig Dispense Refill  . Alcohol Swabs (B-D SINGLE USE SWABS REGULAR) PADS 1 each by Does not apply route 2 (two) times daily as needed. Dx: 250.00 100 each 3  . allopurinol (ZYLOPRIM) 100 MG tablet Take 1 tablet (100 mg total) by mouth daily. 90 tablet 3  . aspirin 325 MG EC tablet Take 325 mg by mouth every morning.    . Blood Glucose Calibration (ACCU-CHEK AVIVA) SOLN Use as directed as needed. 1 each 2  . bromocriptine (PARLODEL) 2.5 MG tablet Take 1 tablet (2.5 mg total) by mouth daily. 30 tablet 11  . cholecalciferol (VITAMIN D) 1000 UNITS tablet Take 1,000 Units by mouth every morning.    . colchicine 0.6 MG tablet Take 1 tablet (0.6 mg total) by mouth daily. 90 tablet 3  . diazepam (VALIUM) 2 MG tablet Take 1-2 tablets (2-4 mg total) by mouth every 6 (six) hours as needed. Prn vertigo 60 tablet 0  . diclofenac (VOLTAREN) 75 MG EC tablet TAKE 1 TABLET BY MOUTH TWICE DAILY AS NEEDED FOR MODERATE PAIN (GOUT  ATTACK) 90 tablet 0  . finasteride (PROSCAR) 5 MG tablet Take 1 tablet (5 mg total) by mouth every morning. 90 tablet 2  . fluticasone furoate-vilanterol (BREO ELLIPTA) 100-25 MCG/INH AEPB Inhale 1 puff into the lungs daily. 1 each 5  . glucose blood test strip Use twice daily as directed. Dx: 250.00 200 each 3  . irbesartan-hydrochlorothiazide (AVALIDE) 300-12.5 MG tablet Take 1 tablet by mouth daily. 90 tablet 2  . Lancet Devices (ACCU-CHEK SOFTCLIX) lancets Use twice daily as instructed. Dx: 250.00 1 each 3  . lovastatin (MEVACOR) 40 MG tablet Take 1 tablet (40 mg total) by mouth every morning. 90 tablet 2  . meclizine (ANTIVERT) 12.5 MG tablet Take 1 tablet (12.5 mg total) by mouth 3 (three) times daily as needed for dizziness. 60 tablet 1  . metFORMIN (GLUCOPHAGE) 500 MG tablet Take 1 tablet (500 mg total) by mouth daily with  breakfast. 90 tablet 3  . Multiple Vitamins-Minerals (MULTI-VITAMIN GUMMIES PO)     . nitroGLYCERIN (NITROSTAT) 0.4 MG SL tablet Place 1 tablet (0.4 mg total) under the tongue every 5 (five) minutes as needed for chest pain. 25 tablet 3  . pantoprazole (PROTONIX) 40 MG tablet Take 1 tablet (40 mg total) by mouth every morning. 90 tablet 2  .  repaglinide (PRANDIN) 2 MG tablet Take 1 tablet (2 mg total) by mouth 3 (three) times daily before meals. 270 tablet 3  . tamsulosin (FLOMAX) 0.4 MG CAPS capsule Take 1 capsule (0.4 mg total) by mouth daily. 90 capsule 3  . VENTOLIN HFA 108 (90 Base) MCG/ACT inhaler INHALE 2 PUFFS INTO THE LUNGS EVERY 4 HOURS AS NEEDED FOR WHEEZING OR SHORTNESS OF BREATH 18 g 5   No current facility-administered medications on file prior to visit.     Allergies  Allergen Reactions  . Enalapril     SOB  . Hydrocodone-Acetaminophen Nausea Only    Can take codein    Family History  Problem Relation Age of Onset  . Hypertension Mother   . Diabetes Father   . Cancer - Other Brother        cancer all over  . Coronary artery disease Other        1st degree male relative  . Hypertension Other   . Cancer Sister 6       ovarian ca  . Colon cancer Neg Hx     BP (!) 104/50 (BP Location: Left Arm, Patient Position: Sitting, Cuff Size: Large)   Pulse 60   Ht 5\' 11"  (1.803 m)   Wt 235 lb 12.8 oz (107 kg)   SpO2 98%   BMI 32.89 kg/m    Review of Systems He denies hypoglycemia.      Objective:   Physical Exam VITAL SIGNS:  See vs page GENERAL: no distress Pulses: dorsalis pedis intact bilat.   MSK: no deformity of the feet CV: trace bilat leg edema Skin:  no ulcer on the feet.  normal color and temp on the feet. Neuro: sensation is intact to touch on the feet  Lab Results  Component Value Date   HGBA1C 9.6 (H) 10/01/2018   Lab Results  Component Value Date   CREATININE 1.39 10/01/2018   BUN 21 10/01/2018   NA 137 10/01/2018   K 4.1 10/01/2018   CL  103 10/01/2018   CO2 25 10/01/2018        Assessment & Plan:  Type 2 DM, with CAD: worse Renal insuff: This limits rx options.  Edema: This also limits rx options   Patient Instructions  A different type of diabetes blood test is requested for you today.  We'll let you know about the results.  check your blood sugar once a day.  vary the time of day when you check, between before the 3 meals, and at bedtime.  also check if you have symptoms of your blood sugar being too high or too low.  please keep a record of the readings and bring it to your next appointment here (or you can bring the meter itself).  You can write it on any piece of paper.  please call us sooner if your blood sugar goes below 70, or if you have a lot of readings over 200. Please come back for a follow-up appointment in 2 months.

## 2018-10-31 LAB — FRUCTOSAMINE: Fructosamine: 321 umol/L — ABNORMAL HIGH (ref 205–285)

## 2018-11-29 ENCOUNTER — Other Ambulatory Visit: Payer: Self-pay | Admitting: Internal Medicine

## 2018-12-03 ENCOUNTER — Telehealth: Payer: Self-pay | Admitting: Internal Medicine

## 2018-12-03 NOTE — Telephone Encounter (Signed)
Pt need approval for a new diabetic machine. Spouse stated [facility] sent over a request that hasn't been address. Please call to advise

## 2018-12-07 ENCOUNTER — Other Ambulatory Visit: Payer: Self-pay

## 2018-12-07 MED ORDER — GLUCOSE BLOOD VI STRP: ORAL_STRIP | 3 refills | Status: DC

## 2018-12-07 MED ORDER — ONETOUCH ULTRA 2 W/DEVICE KIT: PACK | 0 refills | Status: DC

## 2018-12-07 MED ORDER — ONETOUCH DELICA LANCETS 33G MISC: 5 refills | Status: DC

## 2018-12-07 NOTE — Telephone Encounter (Signed)
RXs sent.

## 2018-12-31 ENCOUNTER — Other Ambulatory Visit: Payer: Self-pay

## 2018-12-31 ENCOUNTER — Encounter: Payer: Self-pay | Admitting: Endocrinology

## 2018-12-31 ENCOUNTER — Ambulatory Visit (INDEPENDENT_AMBULATORY_CARE_PROVIDER_SITE_OTHER): Payer: Medicare Other | Admitting: Endocrinology

## 2018-12-31 VITALS — BP 108/50 | HR 57 | Ht 71.0 in | Wt 238.2 lb

## 2018-12-31 DIAGNOSIS — E1159 Type 2 diabetes mellitus with other circulatory complications: Secondary | ICD-10-CM | POA: Diagnosis not present

## 2018-12-31 LAB — POCT GLYCOSYLATED HEMOGLOBIN (HGB A1C): Hemoglobin A1C: 7.7 % — AB (ref 4.0–5.6)

## 2018-12-31 NOTE — Patient Instructions (Addendum)

## 2018-12-31 NOTE — Progress Notes (Signed)
Subjective:    Patient ID: Johnny [name], male    DOB: [birth year], 73 y.o.   MRN: [id number]  HPI Pt returns for f/u of diabetes mellitus:  DM type: 2 Dx'ed: Q000111Q Complications: CAD and renal insuff.   Therapy: 3 oral meds.  DKA: never Severe hypoglycemia: never Pancreatitis: never Pancreatic imaging: never Other: he has never been on insulin, except in the hospital, but he has learned how to take.  edema and renal insuff limit rx options and dosages; he cannot afford name brand meds.   Interval history: pt states he feels well in general.  He says cbg's vary from 87-140.  he never misses meds.   Past Medical History:  Diagnosis Date  . Arthritis   . At risk for sleep apnea    STOP-BANG=  6   SENT TO PCP 05-21-2013  . Benign positional vertigo   . Benign prostatic hypertrophy   . Blood transfusion without reported diagnosis 2000  . CAD (coronary artery disease)    CARDIOLOGIST--  DR Cristopher Peru  . CHF (congestive heart failure) ([place])   . Chronic ischemic heart disease, unspecified   . ED (erectile dysfunction) of organic origin   . GERD (gastroesophageal reflux disease)   . History of CHF (congestive heart failure)    systolic  . HTN (hypertension)   . Hyperlipidemia   . Type 2 diabetes mellitus ([place])   . Wears glasses     Past Surgical History:  Procedure Laterality Date  . CARDIAC CATHETERIZATION  03-03-2000  dr gregg taylor   normal lvsf/  two-vessel cad significant complex stenosis at distal left main  . CARDIOVASCULAR STRESS TEST  08-09-2010  DR Carleene Overlie TAYLOR   normal lexiscan nuclear study/  no ischemia/  normal lvf/  ef 54%  . CATARACT EXTRACTION W/ INTRAOCULAR LENS IMPLANT Right 2011  . COLONOSCOPY  12-30-2009  . CORONARY ARTERY BYPASS GRAFT  03-06-2000   DR [id number]   third vessel  . PENILE PROSTHESIS IMPLANT  02-08-2010   COLOPLAST 3-PIECE INFLATABLE  . PENILE PROSTHESIS IMPLANT N/A 05/27/2013   Procedure: CYSTO REMOVAL OF PENILE PROSTHESIS;  Surgeon:  Claybon Jabs, MD;  Location: [facility];  Service: Urology;  Laterality: N/A;  . POLYPECTOMY  12-30-2009   +TA  . TOTAL HIP ARTHROPLASTY  01/24/2011   Procedure: TOTAL HIP ARTHROPLASTY;  Surgeon: Dione Plover [name];  Location: [facility];  Service: Orthopedics;  Laterality: Left;  . TOTAL HIP ARTHROPLASTY Right 01/30/2013   Procedure: RIGHT TOTAL HIP ARTHROPLASTY;  Surgeon: Gearlean Alf, MD;  Location: [facility];  Service: Orthopedics;  Laterality: Right;    Social History   Socioeconomic History  . Marital status: Married    Spouse name: Not on file  . Number of children: Not on file  . Years of education: Not on file  . Highest education level: Not on file  Occupational History  . Occupation: Engineer, building services: RETIRED  Social Needs  . Financial resource strain: Not on file  . Food insecurity    Worry: Not on file    Inability: Not on file  . Transportation needs    Medical: Not on file    Non-medical: Not on file  Tobacco Use  . Smoking status: Former Smoker    Types: Cigarettes    Quit date: 05/22/1970    Years since quitting: 48.6  . Smokeless tobacco: Never Used  Substance and Sexual Activity  . Alcohol use: No  Alcohol/week: 0.0 standard drinks  . Drug use: No  . Sexual activity: Not on file  Lifestyle  . Physical activity    Days per week: Not on file    Minutes per session: Not on file  . Stress: Not on file  Relationships  . Social Herbalist on phone: Not on file    Gets together: Not on file    Attends religious service: Not on file    Active member of club or organization: Not on file    Attends meetings of clubs or organizations: Not on file    Relationship status: Not on file  . Intimate partner violence    Fear of current or ex partner: Not on file    Emotionally abused: Not on file    Physically abused: Not on file    Forced sexual activity: Not on file  Other Topics Concern  . Not on file  Social History Narrative    Regular Exercise-No          Current Outpatient Medications on File Prior to Visit  Medication Sig Dispense Refill  . Alcohol Swabs (B-D SINGLE USE SWABS REGULAR) PADS 1 each by Does not apply route 2 (two) times daily as needed. Dx: 250.00 100 each 3  . allopurinol (ZYLOPRIM) 100 MG tablet Take 1 tablet (100 mg total) by mouth daily. 90 tablet 3  . aspirin 325 MG EC tablet Take 325 mg by mouth every morning.    . Blood Glucose Calibration (ACCU-CHEK AVIVA) SOLN Use as directed as needed. 1 each 2  . bromocriptine (PARLODEL) 2.5 MG tablet Take 1 tablet (2.5 mg total) by mouth daily. 30 tablet 11  . cholecalciferol (VITAMIN D) 1000 UNITS tablet Take 1,000 Units by mouth every morning.    . colchicine 0.6 MG tablet Take 1 tablet (0.6 mg total) by mouth daily. 90 tablet 3  . diazepam (VALIUM) 2 MG tablet Take 1-2 tablets (2-4 mg total) by mouth every 6 (six) hours as needed. Prn vertigo 60 tablet 0  . diclofenac (VOLTAREN) 75 MG EC tablet TAKE 1 TABLET BY MOUTH  TWICE A DAY AS NEEDED FOR  MODERATE PAIN (GOUT ATTACK) 180 tablet 3  . finasteride (PROSCAR) 5 MG tablet TAKE 1 TABLET BY MOUTH  EVERY MORNING 90 tablet 3  . fluticasone furoate-vilanterol (BREO ELLIPTA) 100-25 MCG/INH AEPB Inhale 1 puff into the lungs daily. 1 each 5  . glucose blood test strip Use twice daily as instructed. Dx: E11.9 200 each 3  . irbesartan-hydrochlorothiazide (AVALIDE) 300-12.5 MG tablet TAKE 1 TABLET BY MOUTH  DAILY 90 tablet 3  . Lancet Devices (ACCU-CHEK SOFTCLIX) lancets Use twice daily as instructed. Dx: 250.00 1 each 3  . lovastatin (MEVACOR) 40 MG tablet TAKE 1 TABLET BY MOUTH  EVERY MORNING 90 tablet 3  . meclizine (ANTIVERT) 12.5 MG tablet Take 1 tablet (12.5 mg total) by mouth 3 (three) times daily as needed for dizziness. 60 tablet 1  . metFORMIN (GLUCOPHAGE) 500 MG tablet Take 1 tablet (500 mg total) by mouth daily with breakfast. 90 tablet 3  . Multiple Vitamins-Minerals (MULTI-VITAMIN GUMMIES PO)      . nitroGLYCERIN (NITROSTAT) 0.4 MG SL tablet Place 1 tablet (0.4 mg total) under the tongue every 5 (five) minutes as needed for chest pain. 25 tablet 3  . pantoprazole (PROTONIX) 40 MG tablet TAKE 1 TABLET BY MOUTH  EVERY MORNING 90 tablet 3  . repaglinide (PRANDIN) 2 MG tablet Take 1 tablet (2  mg total) by mouth 3 (three) times daily before meals. 270 tablet 3  . tamsulosin (FLOMAX) 0.4 MG CAPS capsule Take 1 capsule (0.4 mg total) by mouth daily. 90 capsule 3  . VENTOLIN HFA 108 (90 Base) MCG/ACT inhaler INHALE 2 PUFFS INTO THE LUNGS EVERY 4 HOURS AS NEEDED FOR WHEEZING OR SHORTNESS OF BREATH 18 g 5   No current facility-administered medications on file prior to visit.     Allergies  Allergen Reactions  . Enalapril     SOB  . Hydrocodone-Acetaminophen Nausea Only    Can take codein    Family History  Problem Relation Age of Onset  . Hypertension Mother   . Diabetes Father   . Cancer - Other Brother        cancer all over  . Coronary artery disease Other        1st degree male relative  . Hypertension Other   . Cancer Sister 43       ovarian ca  . Colon cancer Neg Hx     BP (!) 108/50 (BP Location: Left Arm, Patient Position: Sitting, Cuff Size: Large)   Pulse (!) 57   Ht 5\' 11"  (1.803 m)   Wt 238 lb 3.2 oz (108 kg)   SpO2 98%   BMI 33.22 kg/m    Review of Systems He denies hypoglycemia    Objective:   Physical Exam VITAL SIGNS:  See vs page GENERAL: no distress Pulses: dorsalis pedis intact bilat.   MSK: no deformity of the feet CV: 1+ bilat leg edema Skin:  no ulcer on the feet.  normal color and temp on the feet. Neuro: sensation is intact to touch on the feet.   Ext: there is bilateral onychomycosis of the toenails.    Lab Results  Component Value Date   HGBA1C 7.7 (A) 12/31/2018   Lab Results  Component Value Date   CREATININE 1.39 10/01/2018   BUN 21 10/01/2018   NA 137 10/01/2018   K 4.1 10/01/2018   CL 103 10/01/2018   CO2 25 10/01/2018        Assessment & Plan:  Type 2 DM: well-controlled.  Renal insuff: This limits rx options CAD: in this setting, he should avoid hypoglycemia, so we won't increase rx   Patient Instructions  check your blood sugar once a day.  vary the time of day when you check, between before the 3 meals, and at bedtime.  also check if you have symptoms of your blood sugar being too high or too low.  please keep a record of the readings and bring it to your next appointment here (or you can bring the meter itself).  You can write it on any piece of paper.  please call us sooner if your blood sugar goes below 70, or if you have a lot of readings over 200. Please continue the same medications Please come back for a follow-up appointment in 3-4 months.

## 2019-01-02 ENCOUNTER — Ambulatory Visit (INDEPENDENT_AMBULATORY_CARE_PROVIDER_SITE_OTHER): Payer: Medicare Other | Admitting: Internal Medicine

## 2019-01-02 ENCOUNTER — Encounter: Payer: Self-pay | Admitting: Internal Medicine

## 2019-01-02 ENCOUNTER — Other Ambulatory Visit: Payer: Self-pay

## 2019-01-02 VITALS — BP 140/64 | HR 51 | Temp 98.0°F | Ht 71.0 in | Wt 239.0 lb

## 2019-01-02 DIAGNOSIS — E1159 Type 2 diabetes mellitus with other circulatory complications: Secondary | ICD-10-CM

## 2019-01-02 DIAGNOSIS — Z23 Encounter for immunization: Secondary | ICD-10-CM

## 2019-01-02 DIAGNOSIS — E785 Hyperlipidemia, unspecified: Secondary | ICD-10-CM | POA: Diagnosis not present

## 2019-01-02 DIAGNOSIS — I10 Essential (primary) hypertension: Secondary | ICD-10-CM | POA: Diagnosis not present

## 2019-01-02 NOTE — Assessment & Plan Note (Signed)
Dr Loanne Drilling Chronic - A1c is better Stop drinking sweet tea Metformin, Prandin, Bromocriptine; Irbesartan HCT and Lovastatin

## 2019-01-02 NOTE — Progress Notes (Signed)
Subjective:  Patient ID: Johnny [name], male    DOB: 1945/12/01  Age: 73 y.o. MRN: [id number]  CC: No chief complaint on file.   HPI Johnny [name] presents for DM, CAD, dyslipidemia f/u  Outpatient Medications Prior to Visit  Medication Sig Dispense Refill  . Alcohol Swabs (B-D SINGLE USE SWABS REGULAR) PADS 1 each by Does not apply route 2 (two) times daily as needed. Dx: 250.00 100 each 3  . allopurinol (ZYLOPRIM) 100 MG tablet Take 1 tablet (100 mg total) by mouth daily. 90 tablet 3  . aspirin 325 MG EC tablet Take 325 mg by mouth every morning.    . Blood Glucose Calibration (ACCU-CHEK AVIVA) SOLN Use as directed as needed. 1 each 2  . bromocriptine (PARLODEL) 2.5 MG tablet Take 1 tablet (2.5 mg total) by mouth daily. 30 tablet 11  . cholecalciferol (VITAMIN D) 1000 UNITS tablet Take 1,000 Units by mouth every morning.    . colchicine 0.6 MG tablet Take 1 tablet (0.6 mg total) by mouth daily. 90 tablet 3  . diazepam (VALIUM) 2 MG tablet Take 1-2 tablets (2-4 mg total) by mouth every 6 (six) hours as needed. Prn vertigo 60 tablet 0  . diclofenac (VOLTAREN) 75 MG EC tablet TAKE 1 TABLET BY MOUTH  TWICE A DAY AS NEEDED FOR  MODERATE PAIN (GOUT ATTACK) 180 tablet 3  . finasteride (PROSCAR) 5 MG tablet TAKE 1 TABLET BY MOUTH  EVERY MORNING 90 tablet 3  . fluticasone furoate-vilanterol (BREO ELLIPTA) 100-25 MCG/INH AEPB Inhale 1 puff into the lungs daily. 1 each 5  . glucose blood test strip Use twice daily as instructed. Dx: E11.9 200 each 3  . irbesartan-hydrochlorothiazide (AVALIDE) 300-12.5 MG tablet TAKE 1 TABLET BY MOUTH  DAILY 90 tablet 3  . Lancet Devices (ACCU-CHEK SOFTCLIX) lancets Use twice daily as instructed. Dx: 250.00 1 each 3  . lovastatin (MEVACOR) 40 MG tablet TAKE 1 TABLET BY MOUTH  EVERY MORNING 90 tablet 3  . meclizine (ANTIVERT) 12.5 MG tablet Take 1 tablet (12.5 mg total) by mouth 3 (three) times daily as needed for dizziness. 60 tablet 1  . metFORMIN  (GLUCOPHAGE) 500 MG tablet Take 1 tablet (500 mg total) by mouth daily with breakfast. 90 tablet 3  . Multiple Vitamins-Minerals (MULTI-VITAMIN GUMMIES PO)     . nitroGLYCERIN (NITROSTAT) 0.4 MG SL tablet Place 1 tablet (0.4 mg total) under the tongue every 5 (five) minutes as needed for chest pain. 25 tablet 3  . pantoprazole (PROTONIX) 40 MG tablet TAKE 1 TABLET BY MOUTH  EVERY MORNING 90 tablet 3  . repaglinide (PRANDIN) 2 MG tablet Take 1 tablet (2 mg total) by mouth 3 (three) times daily before meals. 270 tablet 3  . tamsulosin (FLOMAX) 0.4 MG CAPS capsule Take 1 capsule (0.4 mg total) by mouth daily. 90 capsule 3  . VENTOLIN HFA 108 (90 Base) MCG/ACT inhaler INHALE 2 PUFFS INTO THE LUNGS EVERY 4 HOURS AS NEEDED FOR WHEEZING OR SHORTNESS OF BREATH 18 g 5   No facility-administered medications prior to visit.     ROS: Review of Systems  Constitutional: Negative for appetite change, fatigue and unexpected weight change.  HENT: Negative for congestion, nosebleeds, sneezing, sore throat and trouble swallowing.   Eyes: Negative for itching and visual disturbance.  Respiratory: Negative for cough.   Cardiovascular: Negative for chest pain, palpitations and leg swelling.  Gastrointestinal: Negative for abdominal distention, blood in stool, diarrhea and nausea.  Genitourinary: Negative for  frequency and hematuria.  Musculoskeletal: Negative for back pain, gait problem, joint swelling and neck pain.  Skin: Negative for rash.  Neurological: Negative for dizziness, tremors, speech difficulty and weakness.  Psychiatric/Behavioral: Negative for agitation, dysphoric mood and sleep disturbance. The patient is not nervous/anxious.     Objective:  BP 140/64 (BP Location: Left Arm, Patient Position: Sitting, Cuff Size: Large)   Pulse (!) 51   Temp 98 F (36.7 C) (Oral)   Ht 5\' 11"  (1.803 m)   Wt 239 lb (108.4 kg)   SpO2 98%   BMI 33.33 kg/m   BP Readings from Last 3 Encounters:  01/02/19  140/64  12/31/18 (!) 108/50  10/29/18 (!) 104/50    Wt Readings from Last 3 Encounters:  01/02/19 239 lb (108.4 kg)  12/31/18 238 lb 3.2 oz (108 kg)  10/29/18 235 lb 12.8 oz (107 kg)    Physical Exam Constitutional:      General: He is not in acute distress.    Appearance: He is well-developed. He is obese.     Comments: NAD  Eyes:     Conjunctiva/sclera: Conjunctivae normal.     Pupils: Pupils are equal, round, and reactive to light.  Neck:     Musculoskeletal: Normal range of motion.     Thyroid: No thyromegaly.     Vascular: No JVD.  Cardiovascular:     Rate and Rhythm: Normal rate and regular rhythm.     Heart sounds: Normal heart sounds. No murmur. No friction rub. No gallop.   Pulmonary:     Effort: Pulmonary effort is normal. No respiratory distress.     Breath sounds: Normal breath sounds. No wheezing or rales.  Chest:     Chest Hanel: No tenderness.  Abdominal:     General: Bowel sounds are normal. There is no distension.     Palpations: Abdomen is soft. There is no mass.     Tenderness: There is no abdominal tenderness. There is no guarding or rebound.  Musculoskeletal: Normal range of motion.        General: No tenderness.  Lymphadenopathy:     Cervical: No cervical adenopathy.  Skin:    General: Skin is warm and dry.     Findings: No rash.  Neurological:     Mental Status: He is alert and oriented to person, place, and time.     Cranial Nerves: No cranial nerve deficit.     Motor: No abnormal muscle tone.     Coordination: Coordination normal.     Gait: Gait normal.     Deep Tendon Reflexes: Reflexes are normal and symmetric.  Psychiatric:        Behavior: Behavior normal.        Thought Content: Thought content normal.        Judgment: Judgment normal.    Foot exam per Dr Loanne Drilling  Lab Results  Component Value Date   WBC 5.8 07/20/2013   HGB 11.4 (L) 07/20/2013   HCT 35.3 (L) 07/20/2013   PLT 258 07/20/2013   GLUCOSE 232 (H) 10/01/2018   CHOL  167 09/09/2016   TRIG 371.0 (H) 09/09/2016   HDL 31.40 (L) 09/09/2016   LDLDIRECT 73.0 09/09/2016   LDLCALC 74 11/29/2013   ALT 14 05/07/2018   AST 10 05/07/2018   NA 137 10/01/2018   K 4.1 10/01/2018   CL 103 10/01/2018   CREATININE 1.39 10/01/2018   BUN 21 10/01/2018   CO2 25 10/01/2018   TSH 3.59 10/14/2010  PSA 0.99 09/09/2016   INR 1.15 01/21/2013   HGBA1C 7.7 (A) 12/31/2018   MICROALBUR <0.7 09/09/2016    Dg Chest 2 View  Result Date: 07/14/2015 CLINICAL DATA:  Shortness of breath and wheezing for 2 weeks EXAM: CHEST  2 VIEW COMPARISON:  07/20/2013 FINDINGS: Cardiac shadow is stable. Postsurgical changes are again seen. The lungs are well aerated bilaterally without evidence of focal infiltrate or sizable effusion. Moderate degenerative changes of thoracic spine are noted. IMPRESSION: No active cardiopulmonary disease. Electronically Signed   By: Inez Catalina M.D.   On: 07/14/2015 08:25    Assessment & Plan:   There are no diagnoses linked to this encounter.   No orders of the defined types were placed in this encounter.    Follow-up: No follow-ups on file.  Walker Kehr, MD

## 2019-01-02 NOTE — Assessment & Plan Note (Signed)
Chronic - A1c is better Stop drinking sweet tea Metformin, Prandin, Bromocriptine; Irbesartan HCT and Lovastatin

## 2019-01-02 NOTE — Assessment & Plan Note (Signed)
Lovastatin 

## 2019-01-02 NOTE — Assessment & Plan Note (Signed)
Wt Readings from Last 3 Encounters:  01/02/19 239 lb (108.4 kg)  12/31/18 238 lb 3.2 oz (108 kg)  10/29/18 235 lb 12.8 oz (107 kg)

## 2019-01-11 NOTE — Addendum Note (Signed)
Addended by: Karren Cobble on: 01/11/2019 10:34 AM   Modules accepted: Orders

## 2019-01-25 NOTE — Progress Notes (Signed)
Cardiology Office Note Date:  02/05/2019  Patient ID:  Johnny [name], DOB [birth year], MRN [id number] PCP:  [place]andria Anger, MD  Electrophysiologist  Dr. Lovena Le    Chief Complaint:  6 mo visit  History of Present Illness: Johnny [name] is a 73 y.o. male with history of arthritis, vertigo, GERD, HTN, HLD, DM, CAD (CABG remotely)  He comes in today to be seen for Dr. Lovena Le.  Last seen by hime via tele-health visit June 2020.  At that time doing well, his CP described as chronic, no anginal symptoms, BP was OK, was intentially losing weight.  No changes were made.  He is doing very well.  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  He feels like he still have very good exertional capacity No CP, palpitations or SOB, no dizziness, near syncope or syncope.  His PMD does labs routinely and monitors/manages his lipids   Past Medical History:  Diagnosis Date  . Arthritis   . At risk for sleep apnea    STOP-BANG=  6   SENT TO PCP 05-21-2013  . Benign positional vertigo   . Benign prostatic hypertrophy   . Blood transfusion without reported diagnosis 2000  . CAD (coronary artery disease)    CARDIOLOGIST--  DR Cristopher Peru  . CHF (congestive heart failure) ([place])   . Chronic ischemic heart disease, unspecified   . ED (erectile dysfunction) of organic origin   . GERD (gastroesophageal reflux disease)   . History of CHF (congestive heart failure)    systolic  . HTN (hypertension)   . Hyperlipidemia   . Type 2 diabetes mellitus ([place])   . Wears glasses     Past Surgical History:  Procedure Laterality Date  . CARDIAC CATHETERIZATION  03-03-2000  dr gregg taylor   normal lvsf/  two-vessel cad significant complex stenosis at distal left main  . CARDIOVASCULAR  STRESS TEST  08-09-2010  DR Carleene Overlie TAYLOR   normal lexiscan nuclear study/  no ischemia/  normal lvf/  ef 54%  . CATARACT EXTRACTION W/ INTRAOCULAR LENS IMPLANT Right 2011  . COLONOSCOPY  12-30-2009  . CORONARY ARTERY BYPASS GRAFT  03-06-2000   DR [id number]   third vessel  . PENILE PROSTHESIS IMPLANT  02-08-2010   COLOPLAST 3-PIECE INFLATABLE  . PENILE PROSTHESIS IMPLANT N/A 05/27/2013   Procedure: CYSTO REMOVAL OF PENILE PROSTHESIS;  Surgeon: Claybon Jabs, MD;  [facility];  Service: Urology;  Laterality: N/A;  . POLYPECTOMY  12-30-2009   +TA  . TOTAL HIP ARTHROPLASTY  01/24/2011   Procedure: TOTAL HIP ARTHROPLASTY;  Surgeon: Dione Plover [name];  Location: [facility];  Service: Orthopedics;  Laterality: Left;  . TOTAL HIP ARTHROPLASTY Right 01/30/2013   Procedure: RIGHT TOTAL HIP ARTHROPLASTY;  Surgeon: Gearlean Alf, MD;  Location: [facility];  Service: Orthopedics;  Laterality: Right;    Current Outpatient Medications  Medication Sig Dispense Refill  . Alcohol Swabs (B-D SINGLE USE SWABS REGULAR) PADS 1 each by Does not apply route 2 (two) times daily as needed. Dx: 250.00 100 each 3  . allopurinol (ZYLOPRIM) 100 MG tablet Take 1 tablet (100 mg total) by mouth daily. 90 tablet 3  . aspirin 325 MG EC tablet Take 325 mg by mouth  every morning.    . Blood Glucose Calibration (ACCU-CHEK AVIVA) SOLN Use as directed as needed. 1 each 2  . bromocriptine (PARLODEL) 2.5 MG tablet Take 1 tablet (2.5 mg total) by mouth daily. 30 tablet 11  . cholecalciferol (VITAMIN D) 1000 UNITS tablet Take 1,000 Units by mouth every morning.    . colchicine 0.6 MG tablet Take 1 tablet (0.6 mg total) by mouth daily. 90 tablet 3  . diazepam (VALIUM) 2 MG tablet Take 1-2 tablets (2-4 mg total) by mouth every 6 (six) hours as needed. Prn vertigo 60 tablet 0  . diclofenac (VOLTAREN) 75 MG EC tablet TAKE 1 TABLET BY MOUTH  TWICE A DAY AS NEEDED FOR  MODERATE PAIN (GOUT ATTACK) 180 tablet 3  .  finasteride (PROSCAR) 5 MG tablet TAKE 1 TABLET BY MOUTH  EVERY MORNING 90 tablet 3  . fluticasone furoate-vilanterol (BREO ELLIPTA) 100-25 MCG/INH AEPB Inhale 1 puff into the lungs daily. 1 each 5  . glucose blood test strip Use twice daily as instructed. Dx: E11.9 200 each 3  . irbesartan-hydrochlorothiazide (AVALIDE) 300-12.5 MG tablet TAKE 1 TABLET BY MOUTH  DAILY 90 tablet 3  . Lancet Devices (ACCU-CHEK SOFTCLIX) lancets Use twice daily as instructed. Dx: 250.00 1 each 3  . lovastatin (MEVACOR) 40 MG tablet TAKE 1 TABLET BY MOUTH  EVERY MORNING 90 tablet 3  . meclizine (ANTIVERT) 12.5 MG tablet Take 1 tablet (12.5 mg total) by mouth 3 (three) times daily as needed for dizziness. 60 tablet 1  . metFORMIN (GLUCOPHAGE) 500 MG tablet Take 1 tablet (500 mg total) by mouth daily with breakfast. 90 tablet 3  . Multiple Vitamins-Minerals (MULTI-VITAMIN GUMMIES PO)     . nitroGLYCERIN (NITROSTAT) 0.4 MG SL tablet Place 1 tablet (0.4 mg total) under the tongue every 5 (five) minutes as needed for chest pain. 25 tablet 3  . pantoprazole (PROTONIX) 40 MG tablet TAKE 1 TABLET BY MOUTH  EVERY MORNING 90 tablet 3  . repaglinide (PRANDIN) 2 MG tablet Take 1 tablet (2 mg total) by mouth 3 (three) times daily before meals. 270 tablet 3  . tamsulosin (FLOMAX) 0.4 MG CAPS capsule Take 1 capsule (0.4 mg total) by mouth daily. 90 capsule 3  . VENTOLIN HFA 108 (90 Base) MCG/ACT inhaler INHALE 2 PUFFS INTO THE LUNGS EVERY 4 HOURS AS NEEDED FOR WHEEZING OR SHORTNESS OF BREATH 18 g 5   No current facility-administered medications for this visit.     Allergies:   Enalapril and Hydrocodone-acetaminophen   Social History:  The patient  reports that he quit smoking about 48 years ago. His smoking use included cigarettes. He has never used smokeless tobacco. He reports that he does not drink alcohol or use drugs.   Family History:  The patient's family history includes Cancer (age of onset: 35) in his sister; Cancer -  Other in his brother; Coronary artery disease in an other family member; Diabetes in his father; Hypertension in his mother and another family member.  ROS:  Please see the history of present illness.  All other systems are reviewed and otherwise negative.   PHYSICAL EXAM:  VS:  BP (!) 138/58   Pulse 63   Ht 5\' 11"  (1.803 m)   Wt 234 lb (106.1 kg)   BMI 32.64 kg/m  BMI: Body mass index is 32.64 kg/m. Well nourished, well developed, in no acute distress  HEENT: normocephalic, atraumatic  Neck: no JVD, carotid bruits or masses Cardiac:  RRR; no significant murmurs,  no rubs, or gallops Lungs:  CTA b/l, no wheezing, rhonchi or rales  Abd: soft, nontender MS: no deformity or atrophy Ext: no edema  Skin: warm and dry, no rash Neuro:  No gross deficits appreciated Psych: euthymic mood, full affect   EKG:  Done today and reviewed by myself shows  SR 63bpm, nonspecific ST/ T changes, no acute or ischemic looking changes   08/30/2013: lexiscan stress myoview Impression Exercise Capacity:  Lexiscan with no exercise. BP Response:  Normal blood pressure response. Clinical Symptoms:  Mild chest pain/dyspnea. ECG Impression:  No significant ST segment change suggestive of ischemia. Comparison with Prior Nuclear Study: No images to compare  Overall Impression:  Low risk stress nuclear study Small fixed defect in the basal inferior Little consistent with diaphragmatic attenuation.  No inducible ischemia.. LV Ejection Fraction: 52%.  LV Ramesh Motion:  Low normal LVF    Recent Labs: 05/07/2018: ALT 14 10/01/2018: BUN 21; Creatinine, Ser 1.39; Potassium 4.1; Sodium 137  No results found for requested labs within last 8760 hours.   CrCl cannot be calculated (Patient's most recent lab result is older than the maximum 21 days allowed.).   Wt Readings from Last 3 Encounters:  02/05/19 234 lb (106.1 kg)  01/02/19 239 lb (108.4 kg)  12/31/18 238 lb 3.2 oz (108 kg)     Other studies reviewed:  Additional studies/records reviewed today include: summarized above  ASSESSMENT AND PLAN:  1. CAD     On ASA, statin tx     PMD manages his lipids     No symptoms  2. HTN     Looks OK, no changes today    Disposition: Reduce his ECASA to 81mg  daily, f/u with Dr. Lovena Le in 6 mo, sooner if needed  Current medicines are reviewed at length with the patient today.  The patient did not have any concerns regarding medicines.  Venetia Night, PA-C 02/05/2019 11:45 AM     [address] [place] [address] [phone] (office)  [phone] (fax)

## 2019-01-28 ENCOUNTER — Other Ambulatory Visit: Payer: Self-pay | Admitting: Cardiology

## 2019-01-28 DIAGNOSIS — Z20822 Contact with and (suspected) exposure to covid-19: Secondary | ICD-10-CM

## 2019-01-30 LAB — NOVEL CORONAVIRUS, NAA: SARS-CoV-2, NAA: NOT DETECTED

## 2019-02-05 ENCOUNTER — Other Ambulatory Visit: Payer: Self-pay

## 2019-02-05 ENCOUNTER — Ambulatory Visit: Payer: Medicare Other | Admitting: Physician Assistant

## 2019-02-05 VITALS — BP 138/58 | HR 63 | Ht 71.0 in | Wt 234.0 lb

## 2019-02-05 DIAGNOSIS — I10 Essential (primary) hypertension: Secondary | ICD-10-CM

## 2019-02-05 DIAGNOSIS — I2510 Atherosclerotic heart disease of native coronary artery without angina pectoris: Secondary | ICD-10-CM | POA: Diagnosis not present

## 2019-02-05 NOTE — Patient Instructions (Addendum)
Medication Instructions:   STOP TAKING :   ASPIRIN 325 MG   START TAKING:  ASPIRIN 81 MG   *If you need a refill on your cardiac medications before your next appointment, please call your pharmacy*  Lab Work: NONE ORDERED  TODAY   If you have labs (blood work) drawn today and your tests are completely normal, you will receive your results only by: [name] MyChart Message (if you have MyChart) OR . A paper copy in the mail If you have any lab test that is abnormal or we need to change your treatment, we will call you to review the results.  Testing/Procedures: NONE ORDERED  TODAY   Follow-Up: At [facility], you and your health needs are our priority.  As part of our continuing mission to provide you with exceptional heart care, we have created designated Provider Care Teams.  These Care Teams include your primary Cardiologist (physician) and Advanced Practice Providers (APPs -  Physician Assistants and Nurse Practitioners) who all work together to provide you with the care you need, when you need it.  Your next appointment:   6 month(s)  The format for your next appointment:   In Person  Provider:   You may see  Dr. Lovena Le  or one of the following Advanced Practice Providers on your designated Care Team:    Chanetta Marshall, NP  Tommye Standard, PA-C  Legrand Como "Jonni Sanger" [place], [place]   Other Instructions:

## 2019-02-12 ENCOUNTER — Other Ambulatory Visit: Payer: Self-pay

## 2019-02-12 ENCOUNTER — Other Ambulatory Visit: Payer: Self-pay | Admitting: Endocrinology

## 2019-02-22 DIAGNOSIS — H26493 Other secondary cataract, bilateral: Secondary | ICD-10-CM | POA: Diagnosis not present

## 2019-02-22 DIAGNOSIS — H04123 Dry eye syndrome of bilateral lacrimal glands: Secondary | ICD-10-CM | POA: Diagnosis not present

## 2019-02-22 DIAGNOSIS — H35342 Macular cyst, hole, or pseudohole, left eye: Secondary | ICD-10-CM | POA: Diagnosis not present

## 2019-02-22 DIAGNOSIS — E119 Type 2 diabetes mellitus without complications: Secondary | ICD-10-CM | POA: Diagnosis not present

## 2019-02-22 DIAGNOSIS — H35033 Hypertensive retinopathy, bilateral: Secondary | ICD-10-CM | POA: Diagnosis not present

## 2019-02-22 LAB — HM DIABETES EYE EXAM

## 2019-03-05 ENCOUNTER — Encounter: Payer: Self-pay | Admitting: Internal Medicine

## 2019-03-23 ENCOUNTER — Other Ambulatory Visit: Payer: Self-pay

## 2019-03-23 DIAGNOSIS — Z20822 Contact with and (suspected) exposure to covid-19: Secondary | ICD-10-CM

## 2019-03-24 LAB — NOVEL CORONAVIRUS, NAA: SARS-CoV-2, NAA: DETECTED — AB

## 2019-03-25 ENCOUNTER — Telehealth: Payer: Self-pay | Admitting: Physician Assistant

## 2019-03-25 ENCOUNTER — Telehealth: Payer: Self-pay | Admitting: Nurse Practitioner

## 2019-03-25 NOTE — Telephone Encounter (Signed)
Called to discuss with Mylo Red [place]wood as follow up to recent Covid test 1/16.  Informed him of his positive test which he was not yet aware of.  Fortunately he is doing quite well.  Unfortunately he participated in a church event 1/17 and also had some family members over later that day.  I informed him that he needed to notify all church and family members of his positive test and have them self isolate then follow up with testing later this week.    Also reviewed his symptoms as part of triage for potential MAB therapy for  those with mild to moderate Covid symptoms and at a high risk of hospitalization.    Pt does not qualify for infusion therapy as his symptoms first presented > 10 days prior to timing of infusion. Symptoms tier reviewed as well as criteria for ending isolation. Preventative practices reviewed. Patient verbalized understanding   Montey Hora, [facility] 03/25/2019, 8:42 AM

## 2019-03-25 NOTE — Telephone Encounter (Signed)
Called to Discuss with patient about Covid symptoms and the use of bamlanivimab, a monoclonal antibody infusion for those with mild to moderate Covid symptoms and at a high risk of hospitalization.     Pt is qualified for this infusion at the [facility] center due to co-morbid conditions and/or a member of an at-risk group.     Unable to reach pt  

## 2019-04-04 ENCOUNTER — Other Ambulatory Visit: Payer: Self-pay

## 2019-04-04 ENCOUNTER — Ambulatory Visit (INDEPENDENT_AMBULATORY_CARE_PROVIDER_SITE_OTHER): Payer: Medicare Other | Admitting: Internal Medicine

## 2019-04-04 DIAGNOSIS — E1159 Type 2 diabetes mellitus with other circulatory complications: Secondary | ICD-10-CM

## 2019-04-04 DIAGNOSIS — U071 COVID-19: Secondary | ICD-10-CM

## 2019-04-04 DIAGNOSIS — I10 Essential (primary) hypertension: Secondary | ICD-10-CM

## 2019-04-07 ENCOUNTER — Encounter: Payer: Self-pay | Admitting: Internal Medicine

## 2019-04-07 DIAGNOSIS — U071 COVID-19: Secondary | ICD-10-CM | POA: Insufficient documentation

## 2019-04-07 NOTE — Assessment & Plan Note (Signed)
Positive test on 03/23/2019.  So far asymptomatic good hydration.  Lozenges with zinc.

## 2019-04-07 NOTE — Assessment & Plan Note (Signed)
Irbesartan HCT.  Monitor blood pressure at home

## 2019-04-07 NOTE — Progress Notes (Signed)
Virtual Visit via Telephone Note  I connected with Johnny [name] on 04/07/19 at  3:20 PM EST by telephone and verified that I am speaking with the correct person using two identifiers.   I discussed the limitations, risks, security and privacy concerns of performing an evaluation and management service by telephone and the availability of in person appointments. I also discussed with the patient that there may be a patient responsible charge related to this service. The patient expressed understanding and agreed to proceed.   History of Present Illness: Johnny [name] tested positive for COVID-19 on January 16.  He has no COVID-19 symptoms as of yet.  Follow-up on hypertension, diabetes, coronary disease.  Everything seems to be stable   Observations/Objective: He sounds normal on the phone.  No hoarseness  Assessment and Plan:  See plan Follow Up Instructions:    I discussed the assessment and treatment plan with the patient. The patient was provided an opportunity to ask questions and all were answered. The patient agreed with the plan and demonstrated an understanding of the instructions.   The patient was advised to call back or seek an in-person evaluation if the symptoms worsen or if the condition fails to improve as anticipated.  I provided 11 minutes of non-face-to-face time during this encounter.   Walker Kehr, MD

## 2019-04-07 NOTE — Assessment & Plan Note (Signed)
Continue with Metformin, Prandin, bromocriptine

## 2019-04-08 ENCOUNTER — Ambulatory Visit: Payer: Medicare Other | Attending: Internal Medicine

## 2019-04-08 DIAGNOSIS — Z20822 Contact with and (suspected) exposure to covid-19: Secondary | ICD-10-CM

## 2019-04-09 LAB — NOVEL CORONAVIRUS, NAA: SARS-CoV-2, NAA: NOT DETECTED

## 2019-04-15 ENCOUNTER — Encounter: Payer: Self-pay | Admitting: Endocrinology

## 2019-04-15 ENCOUNTER — Other Ambulatory Visit: Payer: Self-pay

## 2019-04-15 ENCOUNTER — Ambulatory Visit (INDEPENDENT_AMBULATORY_CARE_PROVIDER_SITE_OTHER): Payer: Medicare Other | Admitting: Endocrinology

## 2019-04-15 DIAGNOSIS — E1159 Type 2 diabetes mellitus with other circulatory complications: Secondary | ICD-10-CM

## 2019-04-15 MED ORDER — ACARBOSE 25 MG PO TABS: 25.0000 mg | ORAL_TABLET | Freq: Three times a day (TID) | ORAL | 11 refills | Status: DC

## 2019-04-15 MED ORDER — ACARBOSE 25 MG PO TABS: 25.0000 mg | ORAL_TABLET | Freq: Three times a day (TID) | ORAL | 3 refills | Status: DC

## 2019-04-15 NOTE — Progress Notes (Signed)
Subjective:    Patient ID: Johnny [name], male    DOB: [birth year], 74 y.o.   MRN: [id number]  HPI  telehealth visit today via phone x 13 minutes Alternatives to telehealth are presented to this patient, and the patient agrees to the telehealth visit. Pt is advised of the cost of the visit, and agrees to this, also.   Patient is at home, and I am at the office.   Persons attending the telehealth visit: the patient, wife, and I Pt returns for f/u of diabetes mellitus:  DM type: 2 Dx'ed: Q000111Q Complications: CAD and renal insuff.   Therapy: 3 oral meds.  DKA: never Severe hypoglycemia: never Pancreatitis: never Pancreatic imaging: never SDOH: he cannot afford name brand meds.  Other: he has never been on insulin, except in the hospital, but he has learned how to take.  edema and renal insuff limit rx options and dosages Interval history: pt states he feels well in general.  He says cbg's vary from 170-290.  he says he never misses meds.   Past Medical History:  Diagnosis Date  . Arthritis   . At risk for sleep apnea    STOP-BANG=  6   SENT TO PCP 05-21-2013  . Benign positional vertigo   . Benign prostatic hypertrophy   . Blood transfusion without reported diagnosis 2000  . CAD (coronary artery disease)    CARDIOLOGIST--  DR Cristopher Peru  . CHF (congestive heart failure) ([place])   . Chronic ischemic heart disease, unspecified   . ED (erectile dysfunction) of organic origin   . GERD (gastroesophageal reflux disease)   . History of CHF (congestive heart failure)    systolic  . HTN (hypertension)   . Hyperlipidemia   . Type 2 diabetes mellitus ([place])   . Wears glasses     Past Surgical History:  Procedure Laterality Date  . CARDIAC CATHETERIZATION  03-03-2000  dr gregg taylor   normal lvsf/  two-vessel cad significant complex stenosis at distal left main  . CARDIOVASCULAR STRESS TEST  08-09-2010  DR Carleene Overlie TAYLOR   normal lexiscan nuclear study/  no ischemia/  normal lvf/   ef 54%  . CATARACT EXTRACTION W/ INTRAOCULAR LENS IMPLANT Right 2011  . COLONOSCOPY  12-30-2009  . CORONARY ARTERY BYPASS GRAFT  03-06-2000   DR [id number]   third vessel  . PENILE PROSTHESIS IMPLANT  02-08-2010   COLOPLAST 3-PIECE INFLATABLE  . PENILE PROSTHESIS IMPLANT N/A 05/27/2013   Procedure: CYSTO REMOVAL OF PENILE PROSTHESIS;  Surgeon: Claybon Jabs, MD;  Location: [facility];  Service: Urology;  Laterality: N/A;  . POLYPECTOMY  12-30-2009   +TA  . TOTAL HIP ARTHROPLASTY  01/24/2011   Procedure: TOTAL HIP ARTHROPLASTY;  Surgeon: Dione Plover [name];  Location: [facility];  Service: Orthopedics;  Laterality: Left;  . TOTAL HIP ARTHROPLASTY Right 01/30/2013   Procedure: RIGHT TOTAL HIP ARTHROPLASTY;  Surgeon: Gearlean Alf, MD;  Location: [facility];  Service: Orthopedics;  Laterality: Right;    Social History   Socioeconomic History  . Marital status: Married    Spouse name: Not on file  . Number of children: Not on file  . Years of education: Not on file  . Highest education level: Not on file  Occupational History  . Occupation: Engineer, building services: RETIRED  Tobacco Use  . Smoking status: Former Smoker    Types: Cigarettes    Quit date: 05/22/1970    Years since quitting: 48.9  .  Smokeless tobacco: Never Used  Substance and Sexual Activity  . Alcohol use: No    Alcohol/week: 0.0 standard drinks  . Drug use: No  . Sexual activity: Not on file  Other Topics Concern  . Not on file  Social History Narrative   Regular Exercise-No         Social Determinants of Health   Financial Resource Strain:   . Difficulty of Paying Living Expenses: Not on file  Food Insecurity:   . Worried About Charity fundraiser in the Last Year: Not on file  . Ran Out of Food in the Last Year: Not on file  Transportation Needs:   . Lack of Transportation (Medical): Not on file  . Lack of Transportation (Non-Medical): Not on file  Physical Activity:   . Days of Exercise per  Week: Not on file  . Minutes of Exercise per Session: Not on file  Stress:   . Feeling of Stress : Not on file  Social Connections:   . Frequency of Communication with Friends and Family: Not on file  . Frequency of Social Gatherings with Friends and Family: Not on file  . Attends Religious Services: Not on file  . Active Member of Clubs or Organizations: Not on file  . Attends Archivist Meetings: Not on file  . Marital Status: Not on file  Intimate Partner Violence:   . Fear of Current or Ex-Partner: Not on file  . Emotionally Abused: Not on file  . Physically Abused: Not on file  . Sexually Abused: Not on file    Current Outpatient Medications on File Prior to Visit  Medication Sig Dispense Refill  . Alcohol Swabs (B-D SINGLE USE SWABS REGULAR) PADS 1 each by Does not apply route 2 (two) times daily as needed. Dx: 250.00 100 each 3  . allopurinol (ZYLOPRIM) 100 MG tablet Take 1 tablet (100 mg total) by mouth daily. 90 tablet 3  . aspirin EC 81 MG tablet Take 81 mg by mouth daily.    . Blood Glucose Calibration (ACCU-CHEK AVIVA) SOLN Use as directed as needed. 1 each 2  . bromocriptine (PARLODEL) 2.5 MG tablet Take 1 tablet (2.5 mg total) by mouth daily. 30 tablet 2  . cholecalciferol (VITAMIN D) 1000 UNITS tablet Take 1,000 Units by mouth every morning.    . colchicine 0.6 MG tablet Take 1 tablet (0.6 mg total) by mouth daily. 90 tablet 3  . diazepam (VALIUM) 2 MG tablet Take 1-2 tablets (2-4 mg total) by mouth every 6 (six) hours as needed. Prn vertigo 60 tablet 0  . diclofenac (VOLTAREN) 75 MG EC tablet TAKE 1 TABLET BY MOUTH  TWICE A DAY AS NEEDED FOR  MODERATE PAIN (GOUT ATTACK) 180 tablet 3  . finasteride (PROSCAR) 5 MG tablet TAKE 1 TABLET BY MOUTH  EVERY MORNING 90 tablet 3  . fluticasone furoate-vilanterol (BREO ELLIPTA) 100-25 MCG/INH AEPB Inhale 1 puff into the lungs daily. 1 each 5  . glucose blood test strip Use twice daily as instructed. Dx: E11.9 200 each 3    . irbesartan-hydrochlorothiazide (AVALIDE) 300-12.5 MG tablet TAKE 1 TABLET BY MOUTH  DAILY 90 tablet 3  . Lancet Devices (ACCU-CHEK SOFTCLIX) lancets Use twice daily as instructed. Dx: 250.00 1 each 3  . lovastatin (MEVACOR) 40 MG tablet TAKE 1 TABLET BY MOUTH  EVERY MORNING 90 tablet 3  . meclizine (ANTIVERT) 12.5 MG tablet Take 1 tablet (12.5 mg total) by mouth 3 (three) times daily as needed  for dizziness. 60 tablet 1  . metFORMIN (GLUCOPHAGE) 500 MG tablet Take 1 tablet (500 mg total) by mouth daily with breakfast. 90 tablet 3  . Multiple Vitamins-Minerals (MULTI-VITAMIN GUMMIES PO)     . nitroGLYCERIN (NITROSTAT) 0.4 MG SL tablet Place 1 tablet (0.4 mg total) under the tongue every 5 (five) minutes as needed for chest pain. 25 tablet 3  . pantoprazole (PROTONIX) 40 MG tablet TAKE 1 TABLET BY MOUTH  EVERY MORNING 90 tablet 3  . repaglinide (PRANDIN) 2 MG tablet Take 1 tablet (2 mg total) by mouth 3 (three) times daily before meals. 270 tablet 3  . tamsulosin (FLOMAX) 0.4 MG CAPS capsule Take 1 capsule (0.4 mg total) by mouth daily. 90 capsule 3  . VENTOLIN HFA 108 (90 Base) MCG/ACT inhaler INHALE 2 PUFFS INTO THE LUNGS EVERY 4 HOURS AS NEEDED FOR WHEEZING OR SHORTNESS OF BREATH 18 g 5   No current facility-administered medications on file prior to visit.    Allergies  Allergen Reactions  . Enalapril     SOB  . Hydrocodone-Acetaminophen Nausea Only    Can take codein    Family History  Problem Relation Age of Onset  . Hypertension Mother   . Diabetes Father   . Cancer - Other Brother        cancer all over  . Coronary artery disease Other        1st degree male relative  . Hypertension Other   . Cancer Sister 60       ovarian ca  . Colon cancer Neg Hx     There were no vitals taken for this visit.   Review of Systems He denies hypoglycemia.     Objective:   Physical Exam   Lab Results  Component Value Date   HGBA1C 7.7 (A) 12/31/2018     Lab Results   Component Value Date   CREATININE 1.39 10/01/2018   BUN 21 10/01/2018   NA 137 10/01/2018   K 4.1 10/01/2018   CL 103 10/01/2018   CO2 25 10/01/2018       Assessment & Plan:  Type 2 DM, with renal insuff: he needs increased rx.  he again declines name brand rx.    Patient Instructions  check your blood sugar once a day.  vary the time of day when you check, between before the 3 meals, and at bedtime.  also check if you have symptoms of your blood sugar being too high or too low.  please keep a record of the readings and bring it to your next appointment here (or you can bring the meter itself).  You can write it on any piece of paper.  please call us sooner if your blood sugar goes below 70, or if you have a lot of readings over 200. I have sent a prescription to your pharmacy, to add "acarbose." Please continue the same other diabetes medications.   Please come back for a follow-up appointment in 1 month.

## 2019-04-15 NOTE — Patient Instructions (Addendum)
check your blood sugar once a day.  vary the time of day when you check, between before the 3 meals, and at bedtime.  also check if you have symptoms of your blood sugar being too high or too low.  please keep a record of the readings and bring it to your next appointment here (or you can bring the meter itself).  You can write it on any piece of paper.  please call us sooner if your blood sugar goes below 70, or if you have a lot of readings over 200. I have sent a prescription to your pharmacy, to add "acarbose." Please continue the same other diabetes medications.   Please come back for a follow-up appointment in 1 month.

## 2019-05-04 ENCOUNTER — Other Ambulatory Visit: Payer: Self-pay | Admitting: Internal Medicine

## 2019-05-09 ENCOUNTER — Other Ambulatory Visit: Payer: Self-pay

## 2019-05-13 ENCOUNTER — Encounter: Payer: Self-pay | Admitting: Endocrinology

## 2019-05-13 ENCOUNTER — Ambulatory Visit (INDEPENDENT_AMBULATORY_CARE_PROVIDER_SITE_OTHER): Payer: Medicare Other | Admitting: Endocrinology

## 2019-05-13 ENCOUNTER — Other Ambulatory Visit: Payer: Self-pay

## 2019-05-13 VITALS — BP 140/80 | HR 73 | Ht 71.0 in | Wt 231.0 lb

## 2019-05-13 DIAGNOSIS — E1159 Type 2 diabetes mellitus with other circulatory complications: Secondary | ICD-10-CM | POA: Diagnosis not present

## 2019-05-13 LAB — POCT GLYCOSYLATED HEMOGLOBIN (HGB A1C): Hemoglobin A1C: 9 % — AB (ref 4.0–5.6)

## 2019-05-13 MED ORDER — RYBELSUS 3 MG PO TABS: 3.0000 mg | ORAL_TABLET | ORAL | 11 refills | Status: DC

## 2019-05-13 NOTE — Progress Notes (Signed)
Subjective:    Patient ID: Johnny [name], male    DOB: [birth year], 74 y.o.   MRN: [id number]  HPI Pt returns for f/u of diabetes mellitus:  DM type: 2 Dx'ed: Q000111Q Complications: CAD and renal insuff.   Therapy: 4 oral meds.  DKA: never Severe hypoglycemia: never Pancreatitis: never Pancreatic imaging: never SDOH: he cannot afford name brand meds.  Other: he has never been on insulin, except in the hospital, but he has learned how to take.  edema and renal insuff limit rx options and dosages.   Interval history: pt states he feels well in general.  He says cbg's vary from 87-170.  he says he never misses meds.   Past Medical History:  Diagnosis Date  . Arthritis   . At risk for sleep apnea    STOP-BANG=  6   SENT TO PCP 05-21-2013  . Benign positional vertigo   . Benign prostatic hypertrophy   . Blood transfusion without reported diagnosis 2000  . CAD (coronary artery disease)    CARDIOLOGIST--  DR Cristopher Peru  . CHF (congestive heart failure) ([place])   . Chronic ischemic heart disease, unspecified   . ED (erectile dysfunction) of organic origin   . GERD (gastroesophageal reflux disease)   . History of CHF (congestive heart failure)    systolic  . HTN (hypertension)   . Hyperlipidemia   . Type 2 diabetes mellitus ([place])   . Wears glasses     Past Surgical History:  Procedure Laterality Date  . CARDIAC CATHETERIZATION  03-03-2000  dr gregg taylor   normal lvsf/  two-vessel cad significant complex stenosis at distal left main  . CARDIOVASCULAR STRESS TEST  08-09-2010  DR Carleene Overlie TAYLOR   normal lexiscan nuclear study/  no ischemia/  normal lvf/  ef 54%  . CATARACT EXTRACTION W/ INTRAOCULAR LENS IMPLANT Right 2011  . COLONOSCOPY  12-30-2009  . CORONARY ARTERY BYPASS GRAFT  03-06-2000   DR [id number]   third vessel  . PENILE PROSTHESIS IMPLANT  02-08-2010   COLOPLAST 3-PIECE INFLATABLE  . PENILE PROSTHESIS IMPLANT N/A 05/27/2013   Procedure: CYSTO REMOVAL OF PENILE  PROSTHESIS;  Surgeon: Claybon Jabs, MD;  Location: [facility];  Service: Urology;  Laterality: N/A;  . POLYPECTOMY  12-30-2009   +TA  . TOTAL HIP ARTHROPLASTY  01/24/2011   Procedure: TOTAL HIP ARTHROPLASTY;  Surgeon: Dione Plover [name];  Location: [facility];  Service: Orthopedics;  Laterality: Left;  . TOTAL HIP ARTHROPLASTY Right 01/30/2013   Procedure: RIGHT TOTAL HIP ARTHROPLASTY;  Surgeon: Gearlean Alf, MD;  Location: [facility];  Service: Orthopedics;  Laterality: Right;    Social History   Socioeconomic History  . Marital status: Married    Spouse name: Not on file  . Number of children: Not on file  . Years of education: Not on file  . Highest education level: Not on file  Occupational History  . Occupation: Engineer, building services: RETIRED  Tobacco Use  . Smoking status: Former Smoker    Types: Cigarettes    Quit date: 05/22/1970    Years since quitting: 49.0  . Smokeless tobacco: Never Used  Substance and Sexual Activity  . Alcohol use: No    Alcohol/week: 0.0 standard drinks  . Drug use: No  . Sexual activity: Not on file  Other Topics Concern  . Not on file  Social History Narrative   Regular Exercise-No         Social  Determinants of Health   Financial Resource Strain:   . Difficulty of Paying Living Expenses: Not on file  Food Insecurity:   . Worried About Charity fundraiser in the Last Year: Not on file  . Ran Out of Food in the Last Year: Not on file  Transportation Needs:   . Lack of Transportation (Medical): Not on file  . Lack of Transportation (Non-Medical): Not on file  Physical Activity:   . Days of Exercise per Week: Not on file  . Minutes of Exercise per Session: Not on file  Stress:   . Feeling of Stress : Not on file  Social Connections:   . Frequency of Communication with Friends and Family: Not on file  . Frequency of Social Gatherings with Friends and Family: Not on file  . Attends Religious Services: Not on file  . Active  Member of Clubs or Organizations: Not on file  . Attends Archivist Meetings: Not on file  . Marital Status: Not on file  Intimate Partner Violence:   . Fear of Current or Ex-Partner: Not on file  . Emotionally Abused: Not on file  . Physically Abused: Not on file  . Sexually Abused: Not on file    Current Outpatient Medications on File Prior to Visit  Medication Sig Dispense Refill  . acarbose (PRECOSE) 25 MG tablet Take 1 tablet (25 mg total) by mouth 3 (three) times daily with meals. 270 tablet 3  . Alcohol Swabs (B-D SINGLE USE SWABS REGULAR) PADS 1 each by Does not apply route 2 (two) times daily as needed. Dx: 250.00 100 each 3  . allopurinol (ZYLOPRIM) 100 MG tablet TAKE 1 TABLET BY MOUTH  DAILY 90 tablet 3  . aspirin EC 81 MG tablet Take 81 mg by mouth daily.    . Blood Glucose Calibration (ACCU-CHEK AVIVA) SOLN Use as directed as needed. 1 each 2  . bromocriptine (PARLODEL) 2.5 MG tablet Take 1 tablet (2.5 mg total) by mouth daily. 30 tablet 2  . cholecalciferol (VITAMIN D) 1000 UNITS tablet Take 1,000 Units by mouth every morning.    . colchicine 0.6 MG tablet Take 1 tablet (0.6 mg total) by mouth daily. 90 tablet 3  . diazepam (VALIUM) 2 MG tablet Take 1-2 tablets (2-4 mg total) by mouth every 6 (six) hours as needed. Prn vertigo 60 tablet 0  . diclofenac (VOLTAREN) 75 MG EC tablet TAKE 1 TABLET BY MOUTH  TWICE A DAY AS NEEDED FOR  MODERATE PAIN (GOUT ATTACK) 180 tablet 3  . finasteride (PROSCAR) 5 MG tablet TAKE 1 TABLET BY MOUTH  EVERY MORNING 90 tablet 3  . fluticasone furoate-vilanterol (BREO ELLIPTA) 100-25 MCG/INH AEPB Inhale 1 puff into the lungs daily. 1 each 5  . glucose blood test strip Use twice daily as instructed. Dx: E11.9 200 each 3  . irbesartan-hydrochlorothiazide (AVALIDE) 300-12.5 MG tablet TAKE 1 TABLET BY MOUTH  DAILY 90 tablet 3  . Lancet Devices (ACCU-CHEK SOFTCLIX) lancets Use twice daily as instructed. Dx: 250.00 1 each 3  . lovastatin  (MEVACOR) 40 MG tablet TAKE 1 TABLET BY MOUTH  EVERY MORNING 90 tablet 3  . metFORMIN (GLUCOPHAGE) 500 MG tablet Take 1 tablet (500 mg total) by mouth daily with breakfast. 90 tablet 3  . Multiple Vitamins-Minerals (MULTI-VITAMIN GUMMIES PO)     . nitroGLYCERIN (NITROSTAT) 0.4 MG SL tablet Place 1 tablet (0.4 mg total) under the tongue every 5 (five) minutes as needed for chest pain. 25 tablet  3  . pantoprazole (PROTONIX) 40 MG tablet TAKE 1 TABLET BY MOUTH  EVERY MORNING 90 tablet 3  . repaglinide (PRANDIN) 2 MG tablet Take 1 tablet (2 mg total) by mouth 3 (three) times daily before meals. 270 tablet 3  . tamsulosin (FLOMAX) 0.4 MG CAPS capsule TAKE 1 CAPSULE BY MOUTH  DAILY 90 capsule 3  . VENTOLIN HFA 108 (90 Base) MCG/ACT inhaler INHALE 2 PUFFS INTO THE LUNGS EVERY 4 HOURS AS NEEDED FOR WHEEZING OR SHORTNESS OF BREATH 18 g 5   No current facility-administered medications on file prior to visit.    Allergies  Allergen Reactions  . Enalapril     SOB  . Hydrocodone-Acetaminophen Nausea Only    Can take codein    Family History  Problem Relation Age of Onset  . Hypertension Mother   . Diabetes Father   . Cancer - Other Brother        cancer all over  . Coronary artery disease Other        1st degree male relative  . Hypertension Other   . Cancer Sister 22       ovarian ca  . Colon cancer Neg Hx     BP 140/80 (BP Location: Right Arm, Patient Position: Sitting, Cuff Size: Large)   Pulse 73   Ht 5\' 11"  (1.803 m)   Wt 231 lb (104.8 kg)   SpO2 98%   BMI 32.22 kg/m    Review of Systems He denies hypoglycemia.      Objective:   Physical Exam VITAL SIGNS:  See vs page GENERAL: no distress Pulses: dorsalis pedis intact bilat.   MSK: no deformity of the feet CV: 1+ bilat leg edema Skin:  no ulcer on the feet.  normal color and temp on the feet. Neuro: sensation is intact to touch on the feet Ext: there is bilateral onychomycosis of the toenails.    Lab Results    Component Value Date   HGBA1C 9.0 (A) 05/13/2019   Lab Results  Component Value Date   CREATININE 1.39 10/01/2018   BUN 21 10/01/2018   NA 137 10/01/2018   K 4.1 10/01/2018   CL 103 10/01/2018   CO2 25 10/01/2018       Assessment & Plan:  Type 2 DM, with CAD: worse Renal insuff: This limits rx options SDOH: I told pt we have exhausted generic options.   Patient Instructions  I have sent a prescription to your pharmacy, to add "Rybelsus." If you cannot afford this, I'll prescribe for you the insulin. check your blood sugar once a day.  vary the time of day when you check, between before the 3 meals, and at bedtime.  also check if you have symptoms of your blood sugar being too high or too low.  please keep a record of the readings and bring it to your next appointment here (or you can bring the meter itself).  You can write it on any piece of paper.  please call us sooner if your blood sugar goes below 70, or if you have a lot of readings over 200. Please come back for a follow-up appointment in 2 months.

## 2019-05-13 NOTE — Patient Instructions (Signed)
I have sent a prescription to your pharmacy, to add "Rybelsus." If you cannot afford this, I'll prescribe for you the insulin. check your blood sugar once a day.  vary the time of day when you check, between before the 3 meals, and at bedtime.  also check if you have symptoms of your blood sugar being too high or too low.  please keep a record of the readings and bring it to your next appointment here (or you can bring the meter itself).  You can write it on any piece of paper.  please call us sooner if your blood sugar goes below 70, or if you have a lot of readings over 200. Please come back for a follow-up appointment in 2 months.

## 2019-05-24 ENCOUNTER — Other Ambulatory Visit: Payer: Self-pay | Admitting: Endocrinology

## 2019-05-27 ENCOUNTER — Other Ambulatory Visit: Payer: Self-pay | Admitting: Endocrinology

## 2019-05-29 ENCOUNTER — Telehealth: Payer: Self-pay | Admitting: Endocrinology

## 2019-05-29 NOTE — Telephone Encounter (Signed)
[place] to inquire about pt concern. Spoke with [name]. States Rx has been ready for pick up. Called pt to make him aware of my conversation. Left detailed VM informing pt that a printed Rx from our office is NOT required as his Rx IS available for pick up at his pharmacy according to [facility]. Advised pt call the office if he had additional questions. No further action required.

## 2019-05-29 NOTE — Telephone Encounter (Signed)
Patient requests that a RX for bromocriptine (PARLODEL) 2.5 MG tablet be printed by Dr. Loanne Drilling so that patient can pick up the RX from our office and take the written RX to his PHARM. Patient requests to be called at ph# [phone] once RX has been printed so that patient can come in to pick it up. Patient's PHARM ([facility]) told patient they have not received the RX above

## 2019-07-11 ENCOUNTER — Other Ambulatory Visit: Payer: Self-pay

## 2019-07-15 ENCOUNTER — Other Ambulatory Visit: Payer: Self-pay

## 2019-07-15 ENCOUNTER — Encounter: Payer: Self-pay | Admitting: Endocrinology

## 2019-07-15 ENCOUNTER — Ambulatory Visit (INDEPENDENT_AMBULATORY_CARE_PROVIDER_SITE_OTHER): Payer: Medicare Other | Admitting: Endocrinology

## 2019-07-15 VITALS — BP 100/60 | HR 78 | Ht 71.0 in | Wt 230.0 lb

## 2019-07-15 DIAGNOSIS — E1159 Type 2 diabetes mellitus with other circulatory complications: Secondary | ICD-10-CM

## 2019-07-15 LAB — POCT GLYCOSYLATED HEMOGLOBIN (HGB A1C): Hemoglobin A1C: 6.9 % — AB (ref 4.0–5.6)

## 2019-07-15 NOTE — Patient Instructions (Addendum)
Please continue the same medications  °check your blood sugar once a day.  vary the time of day when you check, between before the 3 meals, and at bedtime.  also check if you have symptoms of your blood sugar being too high or too low.  please keep a record of the readings and bring it to your next appointment here (or you can bring the meter itself).  You can write it on any piece of paper.  please call us sooner if your blood sugar goes below 70, or if you have a lot of readings over 200. °Please come back for a follow-up appointment in 3-4 months.  °

## 2019-07-15 NOTE — Progress Notes (Signed)
Subjective:    Patient ID: Johnny [name], male    DOB: [birth year], 74 y.o.   MRN: [id number]  HPI Pt returns for f/u of diabetes mellitus:  DM type: 2 Dx'ed: [id number] Complications: CAD and CRI.   Therapy: 5 oral meds.  DKA: never Severe hypoglycemia: never Pancreatitis: never Pancreatic imaging: never SDOH: he cannot afford name brand meds.  Other: he has never been on insulin, except in the hospital, but he has learned how to take.  edema and CRI limit rx options and dosages.   Interval history: pt states he feels well in general.  He says cbg's vary from 83-149.  he says he never misses meds.   Past Medical History:  Diagnosis Date  . Arthritis   . At risk for sleep apnea    STOP-BANG=  6   SENT TO PCP 05-21-2013  . Benign positional vertigo   . Benign prostatic hypertrophy   . Blood transfusion without reported diagnosis 2000  . CAD (coronary artery disease)    CARDIOLOGIST--  DR Cristopher Peru  . CHF (congestive heart failure) ([place])   . Chronic ischemic heart disease, unspecified   . ED (erectile dysfunction) of organic origin   . GERD (gastroesophageal reflux disease)   . History of CHF (congestive heart failure)    systolic  . HTN (hypertension)   . Hyperlipidemia   . Type 2 diabetes mellitus ([place])   . Wears glasses     Past Surgical History:  Procedure Laterality Date  . CARDIAC CATHETERIZATION  03-03-2000  dr gregg taylor   normal lvsf/  two-vessel cad significant complex stenosis at distal left main  . CARDIOVASCULAR STRESS TEST  08-09-2010  DR Carleene Overlie TAYLOR   normal lexiscan nuclear study/  no ischemia/  normal lvf/  ef 54%  . CATARACT EXTRACTION W/ INTRAOCULAR LENS IMPLANT Right 2011  . COLONOSCOPY  12-30-2009  . CORONARY ARTERY BYPASS GRAFT  03-06-2000   DR [id number]   third vessel  . PENILE PROSTHESIS IMPLANT  02-08-2010   COLOPLAST 3-PIECE INFLATABLE  . PENILE PROSTHESIS IMPLANT N/A 05/27/2013   Procedure: CYSTO REMOVAL OF PENILE PROSTHESIS;  Surgeon:  Claybon Jabs, MD;  Location: [facility];  Service: Urology;  Laterality: N/A;  . POLYPECTOMY  12-30-2009   +TA  . TOTAL HIP ARTHROPLASTY  01/24/2011   Procedure: TOTAL HIP ARTHROPLASTY;  Surgeon: Dione Plover [name];  Location: [facility];  Service: Orthopedics;  Laterality: Left;  . TOTAL HIP ARTHROPLASTY Right 01/30/2013   Procedure: RIGHT TOTAL HIP ARTHROPLASTY;  Surgeon: Gearlean Alf, MD;  Location: [facility];  Service: Orthopedics;  Laterality: Right;    Social History   Socioeconomic History  . Marital status: Married    Spouse name: Not on file  . Number of children: Not on file  . Years of education: Not on file  . Highest education level: Not on file  Occupational History  . Occupation: Engineer, building services: RETIRED  Tobacco Use  . Smoking status: Former Smoker    Types: Cigarettes    Quit date: 05/22/1970    Years since quitting: 49.1  . Smokeless tobacco: Never Used  Substance and Sexual Activity  . Alcohol use: No    Alcohol/week: 0.0 standard drinks  . Drug use: No  . Sexual activity: Not on file  Other Topics Concern  . Not on file  Social History Narrative   Regular Exercise-No         Social Determinants of  Health   Financial Resource Strain:   . Difficulty of Paying Living Expenses:   Food Insecurity:   . Worried About Charity fundraiser in the Last Year:   . Arboriculturist in the Last Year:   Transportation Needs:   . Film/video editor (Medical):   [name] Lack of Transportation (Non-Medical):   Physical Activity:   . Days of Exercise per Week:   . Minutes of Exercise per Session:   Stress:   . Feeling of Stress :   Social Connections:   . Frequency of Communication with Friends and Family:   . Frequency of Social Gatherings with Friends and Family:   . Attends Religious Services:   . Active Member of Clubs or Organizations:   . Attends Archivist Meetings:   [name] Marital Status:   Intimate Partner Violence:   . Fear of  Current or Ex-Partner:   . Emotionally Abused:   [name] Physically Abused:   . Sexually Abused:     Current Outpatient Medications on File Prior to Visit  Medication Sig Dispense Refill  . acarbose (PRECOSE) 25 MG tablet Take 1 tablet (25 mg total) by mouth 3 (three) times daily with meals. 270 tablet 3  . Alcohol Swabs (B-D SINGLE USE SWABS REGULAR) PADS 1 each by Does not apply route 2 (two) times daily as needed. Dx: 250.00 100 each 3  . allopurinol (ZYLOPRIM) 100 MG tablet TAKE 1 TABLET BY MOUTH  DAILY 90 tablet 3  . aspirin EC 81 MG tablet Take 81 mg by mouth daily.    . Blood Glucose Calibration (ACCU-CHEK AVIVA) SOLN Use as directed as needed. 1 each 2  . bromocriptine (PARLODEL) 2.5 MG tablet Take 1 tablet by mouth once daily 30 tablet 0  . cholecalciferol (VITAMIN D) 1000 UNITS tablet Take 1,000 Units by mouth every morning.    . colchicine 0.6 MG tablet Take 1 tablet (0.6 mg total) by mouth daily. 90 tablet 3  . diazepam (VALIUM) 2 MG tablet Take 1-2 tablets (2-4 mg total) by mouth every 6 (six) hours as needed. Prn vertigo 60 tablet 0  . diclofenac (VOLTAREN) 75 MG EC tablet TAKE 1 TABLET BY MOUTH  TWICE A DAY AS NEEDED FOR  MODERATE PAIN (GOUT ATTACK) 180 tablet 3  . finasteride (PROSCAR) 5 MG tablet TAKE 1 TABLET BY MOUTH  EVERY MORNING 90 tablet 3  . fluticasone furoate-vilanterol (BREO ELLIPTA) 100-25 MCG/INH AEPB Inhale 1 puff into the lungs daily. 1 each 5  . glucose blood test strip Use twice daily as instructed. Dx: E11.9 200 each 3  . irbesartan-hydrochlorothiazide (AVALIDE) 300-12.5 MG tablet TAKE 1 TABLET BY MOUTH  DAILY 90 tablet 3  . Lancet Devices (ACCU-CHEK SOFTCLIX) lancets Use twice daily as instructed. Dx: 250.00 1 each 3  . lovastatin (MEVACOR) 40 MG tablet TAKE 1 TABLET BY MOUTH  EVERY MORNING 90 tablet 3  . metFORMIN (GLUCOPHAGE) 500 MG tablet Take 1 tablet (500 mg total) by mouth daily with breakfast. 90 tablet 3  . Multiple Vitamins-Minerals (MULTI-VITAMIN  GUMMIES PO)     . nitroGLYCERIN (NITROSTAT) 0.4 MG SL tablet Place 1 tablet (0.4 mg total) under the tongue every 5 (five) minutes as needed for chest pain. 25 tablet 3  . pantoprazole (PROTONIX) 40 MG tablet TAKE 1 TABLET BY MOUTH  EVERY MORNING 90 tablet 3  . repaglinide (PRANDIN) 2 MG tablet Take 1 tablet (2 mg total) by mouth 3 (three) times daily before meals.  270 tablet 3  . Semaglutide (RYBELSUS) 3 MG TABS Take 3 mg by mouth every morning. 30 tablet 11  . tamsulosin (FLOMAX) 0.4 MG CAPS capsule TAKE 1 CAPSULE BY MOUTH  DAILY 90 capsule 3  . VENTOLIN HFA 108 (90 Base) MCG/ACT inhaler INHALE 2 PUFFS INTO THE LUNGS EVERY 4 HOURS AS NEEDED FOR WHEEZING OR SHORTNESS OF BREATH 18 g 5   No current facility-administered medications on file prior to visit.    Allergies  Allergen Reactions  . Enalapril     SOB  . Hydrocodone-Acetaminophen Nausea Only    Can take codein    Family History  Problem Relation Age of Onset  . Hypertension Mother   . Diabetes Father   . Cancer - Other Brother        cancer all over  . Coronary artery disease Other        1st degree male relative  . Hypertension Other   . Cancer Sister 11       ovarian ca  . Colon cancer Neg Hx     BP 100/60   Pulse 78   Ht 5\' 11"  (1.803 m)   Wt 230 lb (104.3 kg)   SpO2 98%   BMI 32.08 kg/m    Review of Systems He denies hypoglycemia and nausea    Objective:   Physical Exam VITAL SIGNS:  See vs page GENERAL: no distress Pulses: dorsalis pedis intact bilat.   MSK: no deformity of the feet CV: trace bilat leg edema Skin:  no ulcer on the feet.  normal color and temp on the feet. Neuro: sensation is intact to touch on the feet.   Ext: there is bilateral onychomycosis of the toenails  Lab Results  Component Value Date   CREATININE 1.39 10/01/2018   BUN 21 10/01/2018   NA 137 10/01/2018   K 4.1 10/01/2018   CL 103 10/01/2018   CO2 25 10/01/2018    Lab Results  Component Value Date   HGBA1C 6.9 (A)  07/15/2019        Assessment & Plan:  Type 2 DM, with stage 3 CRI: well-controlled Edema: This limits rx options   Patient Instructions  Please continue the same medications check your blood sugar once a day.  vary the time of day when you check, between before the 3 meals, and at bedtime.  also check if you have symptoms of your blood sugar being too high or too low.  please keep a record of the readings and bring it to your next appointment here (or you can bring the meter itself).  You can write it on any piece of paper.  please call us sooner if your blood sugar goes below 70, or if you have a lot of readings over 200. Please come back for a follow-up appointment in 3-4 months.

## 2019-07-19 ENCOUNTER — Other Ambulatory Visit: Payer: Self-pay | Admitting: Endocrinology

## 2019-08-08 ENCOUNTER — Other Ambulatory Visit: Payer: Self-pay | Admitting: Endocrinology

## 2019-08-14 ENCOUNTER — Encounter: Payer: Self-pay | Admitting: Internal Medicine

## 2019-08-14 ENCOUNTER — Other Ambulatory Visit: Payer: Self-pay

## 2019-08-14 ENCOUNTER — Ambulatory Visit (INDEPENDENT_AMBULATORY_CARE_PROVIDER_SITE_OTHER): Payer: Medicare Other | Admitting: Internal Medicine

## 2019-08-14 DIAGNOSIS — M10079 Idiopathic gout, unspecified ankle and foot: Secondary | ICD-10-CM

## 2019-08-14 DIAGNOSIS — M7011 Bursitis, right hand: Secondary | ICD-10-CM | POA: Diagnosis not present

## 2019-08-14 DIAGNOSIS — E1159 Type 2 diabetes mellitus with other circulatory complications: Secondary | ICD-10-CM

## 2019-08-14 MED ORDER — ALLOPURINOL 100 MG PO TABS: 100.0000 mg | ORAL_TABLET | Freq: Every day | ORAL | 3 refills | Status: DC

## 2019-08-14 MED ORDER — METHYLPREDNISOLONE 4 MG PO TBPK: ORAL_TABLET | ORAL | 0 refills | Status: DC

## 2019-08-14 MED ORDER — COLCHICINE 0.6 MG PO TABS: 0.6000 mg | ORAL_TABLET | Freq: Every day | ORAL | 3 refills | Status: DC

## 2019-08-14 NOTE — Assessment & Plan Note (Signed)
R hand swelling x 2 d; h/o gout Start Allopurinol; Take colchicine Take Diclofenac daily prn

## 2019-08-14 NOTE — Assessment & Plan Note (Signed)
Chronic Metformin, Prandin, Bromocriptine; Irbesartan HCT and Lovastatin

## 2019-08-14 NOTE — Patient Instructions (Signed)
 Gout  Gout is painful swelling of your joints. Gout is a type of arthritis. It is caused by having too much uric acid in your body. Uric acid is a chemical that is made when your body breaks down substances called purines. If your body has too much uric acid, sharp crystals can form and build up in your joints. This causes pain and swelling. Gout attacks can happen quickly and be very painful (acute gout). Over time, the attacks can affect more joints and happen more often (chronic gout). What are the causes?  Too much uric acid in your blood. This can happen because: ? Your kidneys do not remove enough uric acid from your blood. ? Your body makes too much uric acid. ? You eat too many foods that are high in purines. These foods include organ meats, some seafood, and beer.  Trauma or stress. What increases the risk?  Having a family history of gout.  Being male and middle-aged.  Being male and having gone through menopause.  Being very overweight (obese).  Drinking alcohol, especially beer.  Not having enough water in the body (being dehydrated).  Losing weight too quickly.  Having an organ transplant.  Having lead poisoning.  Taking certain medicines.  Having kidney disease.  Having a skin condition called psoriasis. What are the signs or symptoms? An attack of acute gout usually happens in just one joint. The most common place is the big toe. Attacks often start at night. Other joints that may be affected include joints of the feet, ankle, knee, fingers, wrist, or elbow. Symptoms of an attack may include:  Very bad pain.  Warmth.  Swelling.  Stiffness.  Shiny, red, or purple skin.  Tenderness. The affected joint may be very painful to touch.  Chills and fever. Chronic gout may cause symptoms more often. More joints may be involved. You may also have white or yellow lumps (tophi) on your hands or feet or in other areas near your joints. How is this  treated?  Treatment for this condition has two phases: treating an acute attack and preventing future attacks.  Acute gout treatment may include: ? NSAIDs. ? Steroids. These are taken by mouth or injected into a joint. ? Colchicine. This medicine relieves pain and swelling. It can be given by mouth or through an IV tube.  Preventive treatment may include: ? Taking small doses of NSAIDs or colchicine daily. ? Using a medicine that reduces uric acid levels in your blood. ? Making changes to your diet. You may need to see a food expert (dietitian) about what to eat and drink to prevent gout. Follow these instructions at home: During a gout attack   If told, put ice on the painful area: ? Put ice in a plastic bag. ? Place a towel between your skin and the bag. ? Leave the ice on for 20 minutes, 2-3 times a day.  Raise (elevate) the painful joint above the level of your heart as often as you can.  Rest the joint as much as possible. If the joint is in your leg, you may be given crutches.  Follow instructions from your doctor about what you cannot eat or drink. Avoiding future gout attacks  Eat a low-purine diet. Avoid foods and drinks such as: ? Liver. ? Kidney. ? Anchovies. ? Asparagus. ? Herring. ? Mushrooms. ? Mussels. ? Beer.  Stay at a healthy weight. If you want to lose weight, talk with your doctor. Do not lose   weight too fast.  Start or continue an exercise plan as told by your doctor. Eating and drinking  Drink enough fluids to keep your pee (urine) pale yellow.  If you drink alcohol: ? Limit how much you use to:  0-1 drink a day for women.  0-2 drinks a day for men. ? Be aware of how much alcohol is in your drink. In the [place] one drink equals one 12 oz bottle of beer (355 mL), one 5 oz glass of wine (148 mL), or one 1 oz glass of hard liquor (44 mL). General instructions  Take over-the-counter and prescription medicines only as told by your doctor.  Do  not drive or use heavy machinery while taking prescription pain medicine.  Return to your normal activities as told by your doctor. Ask your doctor what activities are safe for you.  Keep all follow-up visits as told by your doctor. This is important. Contact a doctor if:  You have another gout attack.  You still have symptoms of a gout attack after 10 days of treatment.  You have problems (side effects) because of your medicines.  You have chills or a fever.  You have burning pain when you pee (urinate).  You have pain in your lower back or belly. Get help right away if:  You have very bad pain.  Your pain cannot be controlled.  You cannot pee. Summary  Gout is painful swelling of the joints.  The most common site of pain is the big toe, but it can affect other joints.  Medicines and avoiding some foods can help to prevent and treat gout attacks. This information is not intended to replace advice given to you by your health care provider. Make sure you discuss any questions you have with your health care provider. Document Revised: 09/13/2017 Document Reviewed: 09/13/2017 Elsevier Patient Education  2020 Elsevier Inc.  

## 2019-08-14 NOTE — Progress Notes (Signed)
Subjective:  Patient ID: Johnny [name], male    DOB: 1946-02-15  Age: 74 y.o. MRN: [id number]  CC: No chief complaint on file.   HPI Johnny [name] presents for HTN, DM, CAD f/u C/o R hand swelling x 2 d; h/o gout Not taking Allopurinol; ?taking colchicine,  Taking Diclofenac daily  Outpatient Medications Prior to Visit  Medication Sig Dispense Refill  . acarbose (PRECOSE) 25 MG tablet Take 1 tablet (25 mg total) by mouth 3 (three) times daily with meals. 270 tablet 3  . Alcohol Swabs (B-D SINGLE USE SWABS REGULAR) PADS 1 each by Does not apply route 2 (two) times daily as needed. Dx: 250.00 100 each 3  . aspirin EC 81 MG tablet Take 81 mg by mouth daily.    . Blood Glucose Calibration (ACCU-CHEK AVIVA) SOLN Use as directed as needed. 1 each 2  . bromocriptine (PARLODEL) 2.5 MG tablet Take 1 tablet by mouth once daily 30 tablet 1  . cholecalciferol (VITAMIN D) 1000 UNITS tablet Take 1,000 Units by mouth every morning.    . colchicine 0.6 MG tablet Take 1 tablet (0.6 mg total) by mouth daily. 90 tablet 3  . diazepam (VALIUM) 2 MG tablet Take 1-2 tablets (2-4 mg total) by mouth every 6 (six) hours as needed. Prn vertigo 60 tablet 0  . diclofenac (VOLTAREN) 75 MG EC tablet TAKE 1 TABLET BY MOUTH  TWICE A DAY AS NEEDED FOR  MODERATE PAIN (GOUT ATTACK) 180 tablet 3  . finasteride (PROSCAR) 5 MG tablet TAKE 1 TABLET BY MOUTH  EVERY MORNING 90 tablet 3  . fluticasone furoate-vilanterol (BREO ELLIPTA) 100-25 MCG/INH AEPB Inhale 1 puff into the lungs daily. 1 each 5  . glucose blood test strip Use twice daily as instructed. Dx: E11.9 200 each 3  . irbesartan-hydrochlorothiazide (AVALIDE) 300-12.5 MG tablet TAKE 1 TABLET BY MOUTH  DAILY 90 tablet 3  . Lancet Devices (ACCU-CHEK SOFTCLIX) lancets Use twice daily as instructed. Dx: 250.00 1 each 3  . lovastatin (MEVACOR) 40 MG tablet TAKE 1 TABLET BY MOUTH  EVERY MORNING 90 tablet 3  . metFORMIN (GLUCOPHAGE) 500 MG tablet Take 1 tablet (500 mg  total) by mouth daily with breakfast. 90 tablet 3  . metFORMIN (GLUCOPHAGE-XR) 500 MG 24 hr tablet TAKE 1 TABLET BY MOUTH  DAILY WITH BREAKFAST 90 tablet 3  . Multiple Vitamins-Minerals (MULTI-VITAMIN GUMMIES PO)     . nitroGLYCERIN (NITROSTAT) 0.4 MG SL tablet Place 1 tablet (0.4 mg total) under the tongue every 5 (five) minutes as needed for chest pain. 25 tablet 3  . pantoprazole (PROTONIX) 40 MG tablet TAKE 1 TABLET BY MOUTH  EVERY MORNING 90 tablet 3  . repaglinide (PRANDIN) 2 MG tablet Take 1 tablet (2 mg total) by mouth 3 (three) times daily before meals. 270 tablet 3  . Semaglutide (RYBELSUS) 3 MG TABS Take 3 mg by mouth every morning. 30 tablet 11  . tamsulosin (FLOMAX) 0.4 MG CAPS capsule TAKE 1 CAPSULE BY MOUTH  DAILY 90 capsule 3  . VENTOLIN HFA 108 (90 Base) MCG/ACT inhaler INHALE 2 PUFFS INTO THE LUNGS EVERY 4 HOURS AS NEEDED FOR WHEEZING OR SHORTNESS OF BREATH 18 g 5  . allopurinol (ZYLOPRIM) 100 MG tablet TAKE 1 TABLET BY MOUTH  DAILY (Patient not taking: Reported on 08/14/2019) 90 tablet 3   No facility-administered medications prior to visit.    ROS: Review of Systems  Constitutional: Negative for appetite change, fatigue and unexpected weight change.  HENT: Negative for congestion, nosebleeds, sneezing, sore throat and trouble swallowing.   Eyes: Negative for itching and visual disturbance.  Respiratory: Negative for cough.   Cardiovascular: Negative for chest pain, palpitations and leg swelling.  Gastrointestinal: Negative for abdominal distention, blood in stool, diarrhea and nausea.  Genitourinary: Negative for frequency and hematuria.  Musculoskeletal: Positive for arthralgias and back pain. Negative for gait problem, joint swelling and neck pain.  Skin: Negative for rash.  Neurological: Negative for dizziness, tremors, speech difficulty and weakness.  Psychiatric/Behavioral: Negative for agitation, dysphoric mood and sleep disturbance. The patient is not  nervous/anxious.     Objective:  BP 132/70 (BP Location: Left Arm, Patient Position: Sitting, Cuff Size: Large)   Pulse 68   Temp 98.6 F (37 C) (Oral)   Ht 5\' 11"  (1.803 m)   Wt 232 lb (105.2 kg)   SpO2 98%   BMI 32.36 kg/m   BP Readings from Last 3 Encounters:  08/14/19 132/70  07/15/19 100/60  05/13/19 140/80    Wt Readings from Last 3 Encounters:  08/14/19 232 lb (105.2 kg)  07/15/19 230 lb (104.3 kg)  05/13/19 231 lb (104.8 kg)    Physical Exam Constitutional:      General: He is not in acute distress.    Appearance: He is well-developed. He is obese.     Comments: NAD  Eyes:     Conjunctiva/sclera: Conjunctivae normal.     Pupils: Pupils are equal, round, and reactive to light.  Neck:     Thyroid: No thyromegaly.     Vascular: No JVD.  Cardiovascular:     Rate and Rhythm: Normal rate and regular rhythm.     Heart sounds: Normal heart sounds. No murmur. No friction rub. No gallop.   Pulmonary:     Effort: Pulmonary effort is normal. No respiratory distress.     Breath sounds: Normal breath sounds. No wheezing or rales.  Chest:     Chest [place]: No tenderness.  Abdominal:     General: Bowel sounds are normal. There is no distension.     Palpations: Abdomen is soft. There is no mass.     Tenderness: There is no abdominal tenderness. There is no guarding or rebound.  Musculoskeletal:        General: No tenderness. Normal range of motion.     Cervical back: Normal range of motion.  Lymphadenopathy:     Cervical: No cervical adenopathy.  Skin:    General: Skin is warm and dry.     Findings: No rash.  Neurological:     Mental Status: He is alert and oriented to person, place, and time.     Cranial Nerves: No cranial nerve deficit.     Motor: No abnormal muscle tone.     Coordination: Coordination normal.     Gait: Gait normal.     Deep Tendon Reflexes: Reflexes are normal and symmetric.  Psychiatric:        Behavior: Behavior normal.        Thought  Content: Thought content normal.        Judgment: Judgment normal.     Lab Results  Component Value Date   WBC 5.8 07/20/2013   HGB 11.4 (L) 07/20/2013   HCT 35.3 (L) 07/20/2013   PLT 258 07/20/2013   GLUCOSE 232 (H) 10/01/2018   CHOL 167 09/09/2016   TRIG 371.0 (H) 09/09/2016   HDL 31.40 (L) 09/09/2016   LDLDIRECT 73.0 09/09/2016   LDLCALC 74 11/29/2013  ALT 14 05/07/2018   AST 10 05/07/2018   NA 137 10/01/2018   K 4.1 10/01/2018   CL 103 10/01/2018   CREATININE 1.39 10/01/2018   BUN 21 10/01/2018   CO2 25 10/01/2018   TSH 3.59 10/14/2010   PSA 0.99 09/09/2016   INR 1.15 01/21/2013   HGBA1C 6.9 (A) 07/15/2019   MICROALBUR <0.7 09/09/2016    DG Chest 2 View  Result Date: 07/14/2015 CLINICAL DATA:  Shortness of breath and wheezing for 2 weeks EXAM: CHEST  2 VIEW COMPARISON:  07/20/2013 FINDINGS: Cardiac shadow is stable. Postsurgical changes are again seen. The lungs are well aerated bilaterally without evidence of focal infiltrate or sizable effusion. Moderate degenerative changes of thoracic spine are noted. IMPRESSION: No active cardiopulmonary disease. Electronically Signed   By: Inez Catalina M.D.   On: 07/14/2015 08:25    Assessment & Plan:   There are no diagnoses linked to this encounter.   No orders of the defined types were placed in this encounter.    Follow-up: No follow-ups on file.  Walker Kehr, MD

## 2019-08-21 ENCOUNTER — Telehealth: Payer: Self-pay | Admitting: Endocrinology

## 2019-08-21 NOTE — Telephone Encounter (Signed)
Cheapest is [facility].  Do you know how to do syringe and vial, or do you need to be ref to [facility]?

## 2019-08-21 NOTE — Telephone Encounter (Signed)
Patient's wife Angeline requests to be called at ph# [phone] (if no answer please call Angeline at ph# [phone])- re: Patient is unable to afford RX for Semaglutide (RYBELSUS) 3 MG TABS.  Patient and his wife would like to know if there is an affordable alternative or a generic for Rybelsus.

## 2019-08-21 NOTE — Telephone Encounter (Signed)
Called [phone] and LVM. Also call Angeline @ [phone]. States she spoke with pt insurance prior to me calling her back. States he is in the donut hole and cannot afford any of the alternative insulins covered by his plan. States each alternative is over $200.00. Asking now if there is an oral alternative such as increasing his Metformin, etc? Advised I would forward this message back to Dr. Loanne Drilling for him to review and advise

## 2019-08-21 NOTE — Telephone Encounter (Signed)
Only other option is insulin, which I am happy to prescribe.  Do you know how to take?

## 2019-08-21 NOTE — Telephone Encounter (Signed)
Please review concern and advise about an affordable alternative

## 2019-08-22 NOTE — Telephone Encounter (Signed)
Called Angeline and informed her about Dr. Cordelia Pen response. States she has discussed possible need for insulin with pt and since his last A1C is 6.8, they have decided NOT to be placed back on insulin. Pt states he would prefer to work on his diet and remain on the same medications he is currently taking. No changes or new orders are required at this time.

## 2019-09-20 ENCOUNTER — Other Ambulatory Visit: Payer: Self-pay | Admitting: Endocrinology

## 2019-10-10 ENCOUNTER — Other Ambulatory Visit: Payer: Self-pay | Admitting: Endocrinology

## 2019-10-12 ENCOUNTER — Other Ambulatory Visit: Payer: Self-pay | Admitting: Endocrinology

## 2019-10-17 ENCOUNTER — Other Ambulatory Visit: Payer: Self-pay | Admitting: Internal Medicine

## 2019-11-14 ENCOUNTER — Ambulatory Visit (INDEPENDENT_AMBULATORY_CARE_PROVIDER_SITE_OTHER): Payer: Medicare Other | Admitting: Internal Medicine

## 2019-11-14 ENCOUNTER — Other Ambulatory Visit: Payer: Self-pay

## 2019-11-14 ENCOUNTER — Encounter: Payer: Self-pay | Admitting: Internal Medicine

## 2019-11-14 DIAGNOSIS — I10 Essential (primary) hypertension: Secondary | ICD-10-CM | POA: Diagnosis not present

## 2019-11-14 DIAGNOSIS — E1159 Type 2 diabetes mellitus with other circulatory complications: Secondary | ICD-10-CM

## 2019-11-14 DIAGNOSIS — E785 Hyperlipidemia, unspecified: Secondary | ICD-10-CM

## 2019-11-14 DIAGNOSIS — I2510 Atherosclerotic heart disease of native coronary artery without angina pectoris: Secondary | ICD-10-CM

## 2019-11-14 DIAGNOSIS — N320 Bladder-neck obstruction: Secondary | ICD-10-CM | POA: Diagnosis not present

## 2019-11-14 DIAGNOSIS — N521 Erectile dysfunction due to diseases classified elsewhere: Secondary | ICD-10-CM

## 2019-11-14 LAB — BASIC METABOLIC PANEL WITHOUT GFR
BUN/Creatinine Ratio: 15 (calc) (ref 6–22)
BUN: 20 mg/dL (ref 7–25)
CO2: 25 mmol/L (ref 20–32)
Calcium: 9.2 mg/dL (ref 8.6–10.3)
Chloride: 102 mmol/L (ref 98–110)
Creat: 1.32 mg/dL — ABNORMAL HIGH (ref 0.70–1.18)
GFR, Est African American: 61 mL/min/{1.73_m2}
GFR, Est Non African American: 52 mL/min/{1.73_m2} — ABNORMAL LOW
Glucose, Bld: 185 mg/dL — ABNORMAL HIGH (ref 65–99)
Potassium: 4.6 mmol/L (ref 3.5–5.3)
Sodium: 137 mmol/L (ref 135–146)

## 2019-11-14 LAB — URIC ACID: Uric Acid, Serum: 6.8 mg/dL (ref 4.0–8.0)

## 2019-11-14 LAB — PSA: PSA: 1 ng/mL (ref <=4.0)

## 2019-11-14 NOTE — Assessment & Plan Note (Addendum)
Metformin, Prandin, Bromocriptine; Irbesartan HCT and Lovastatin A1c

## 2019-11-14 NOTE — Addendum Note (Signed)
Addended by: Hazle Quant on: 11/14/2019 11:55 AM   Modules accepted: Orders

## 2019-11-14 NOTE — Assessment & Plan Note (Signed)
Urol ref 

## 2019-11-14 NOTE — Progress Notes (Signed)
Subjective:  Patient ID: Johnny [name], male    DOB: 1945-07-14  Age: 74 y.o. MRN: [id number]  CC: No chief complaint on file.   HPI Johnny [name] presents for DM, HTN, gout, CAD f/u   Outpatient Medications Prior to Visit  Medication Sig Dispense Refill  . acarbose (PRECOSE) 25 MG tablet Take 1 tablet (25 mg total) by mouth 3 (three) times daily with meals. 270 tablet 3  . Alcohol Swabs (B-D SINGLE USE SWABS REGULAR) PADS 1 each by Does not apply route 2 (two) times daily as needed. Dx: 250.00 100 each 3  . allopurinol (ZYLOPRIM) 100 MG tablet Take 1 tablet (100 mg total) by mouth daily. 90 tablet 3  . aspirin EC 81 MG tablet Take 81 mg by mouth daily.    . Blood Glucose Calibration (ACCU-CHEK AVIVA) SOLN Use as directed as needed. 1 each 2  . bromocriptine (PARLODEL) 2.5 MG tablet Take 1 tablet by mouth once daily 30 tablet 0  . cholecalciferol (VITAMIN D) 1000 UNITS tablet Take 1,000 Units by mouth every morning.    . colchicine 0.6 MG tablet Take 1 tablet (0.6 mg total) by mouth daily. 90 tablet 3  . diazepam (VALIUM) 2 MG tablet Take 1-2 tablets (2-4 mg total) by mouth every 6 (six) hours as needed. Prn vertigo 60 tablet 0  . diclofenac (VOLTAREN) 75 MG EC tablet TAKE 1 TABLET BY MOUTH  TWICE A DAY AS NEEDED FOR  MODERATE PAIN (GOUT ATTACK) 180 tablet 3  . finasteride (PROSCAR) 5 MG tablet TAKE 1 TABLET BY MOUTH  EVERY MORNING 90 tablet 3  . fluticasone furoate-vilanterol (BREO ELLIPTA) 100-25 MCG/INH AEPB Inhale 1 puff into the lungs daily. 1 each 5  . irbesartan-hydrochlorothiazide (AVALIDE) 300-12.5 MG tablet TAKE 1 TABLET BY MOUTH  DAILY 90 tablet 3  . Lancet Devices (ACCU-CHEK SOFTCLIX) lancets Use twice daily as instructed. Dx: 250.00 1 each 3  . lovastatin (MEVACOR) 40 MG tablet TAKE 1 TABLET BY MOUTH  EVERY MORNING 90 tablet 3  . metFORMIN (GLUCOPHAGE) 500 MG tablet Take 1 tablet (500 mg total) by mouth daily with breakfast. 90 tablet 3  . metFORMIN (GLUCOPHAGE-XR) 500  MG 24 hr tablet TAKE 1 TABLET BY MOUTH  DAILY WITH BREAKFAST 90 tablet 3  . methylPREDNISolone (MEDROL DOSEPAK) 4 MG TBPK tablet As directed 21 tablet 0  . Multiple Vitamins-Minerals (MULTI-VITAMIN GUMMIES PO)     . nitroGLYCERIN (NITROSTAT) 0.4 MG SL tablet Place 1 tablet (0.4 mg total) under the tongue every 5 (five) minutes as needed for chest pain. 25 tablet 3  . ONETOUCH ULTRA test strip USE TWICE DAILY AS  INSTRUCTED 200 strip 3  . pantoprazole (PROTONIX) 40 MG tablet TAKE 1 TABLET BY MOUTH  EVERY MORNING 90 tablet 3  . repaglinide (PRANDIN) 2 MG tablet TAKE 1 TABLET BY MOUTH THREE TIMES DAILY BEFORE  A  MEAL 270 tablet 0  . Semaglutide (RYBELSUS) 3 MG TABS Take 3 mg by mouth every morning. 30 tablet 11  . tamsulosin (FLOMAX) 0.4 MG CAPS capsule TAKE 1 CAPSULE BY MOUTH  DAILY 90 capsule 3  . VENTOLIN HFA 108 (90 Base) MCG/ACT inhaler INHALE 2 PUFFS INTO THE LUNGS EVERY 4 HOURS AS NEEDED FOR WHEEZING OR SHORTNESS OF BREATH 18 g 5   No facility-administered medications prior to visit.    ROS: Review of Systems  Constitutional: Negative for appetite change, fatigue and unexpected weight change.  HENT: Negative for congestion, nosebleeds, sneezing, sore throat  and trouble swallowing.   Eyes: Negative for itching and visual disturbance.  Respiratory: Negative for cough.   Cardiovascular: Negative for chest pain, palpitations and leg swelling.  Gastrointestinal: Negative for abdominal distention, blood in stool, diarrhea and nausea.  Genitourinary: Negative for frequency and hematuria.  Musculoskeletal: Positive for arthralgias. Negative for back pain, gait problem, joint swelling and neck pain.  Skin: Negative for rash.  Neurological: Negative for dizziness, tremors, speech difficulty and weakness.  Psychiatric/Behavioral: Negative for agitation, dysphoric mood and sleep disturbance. The patient is not nervous/anxious.     Objective:  BP (!) 148/60 (BP Location: Left Arm, Patient  Position: Sitting, Cuff Size: Large)   Pulse 64   Temp 98.2 F (36.8 C) (Oral)   Ht 5\' 11"  (1.803 m)   Wt 227 lb (103 kg)   SpO2 99%   BMI 31.66 kg/m   BP Readings from Last 3 Encounters:  11/14/19 (!) 148/60  08/14/19 132/70  07/15/19 100/60    Wt Readings from Last 3 Encounters:  11/14/19 227 lb (103 kg)  08/14/19 232 lb (105.2 kg)  07/15/19 230 lb (104.3 kg)    Physical Exam Constitutional:      General: He is not in acute distress.    Appearance: He is well-developed. He is obese.     Comments: NAD  Eyes:     Conjunctiva/sclera: Conjunctivae normal.     Pupils: Pupils are equal, round, and reactive to light.  Neck:     Thyroid: No thyromegaly.     Vascular: No JVD.  Cardiovascular:     Rate and Rhythm: Normal rate and regular rhythm.     Heart sounds: Normal heart sounds. No murmur heard.  No friction rub. No gallop.   Pulmonary:     Effort: Pulmonary effort is normal. No respiratory distress.     Breath sounds: Normal breath sounds. No wheezing or rales.  Chest:     Chest [place]: No tenderness.  Abdominal:     General: Bowel sounds are normal. There is no distension.     Palpations: Abdomen is soft. There is no mass.     Tenderness: There is no abdominal tenderness. There is no guarding or rebound.  Musculoskeletal:        General: No tenderness. Normal range of motion.     Cervical back: Normal range of motion.  Lymphadenopathy:     Cervical: No cervical adenopathy.  Skin:    General: Skin is warm and dry.     Findings: No rash.  Neurological:     Mental Status: He is alert and oriented to person, place, and time.     Cranial Nerves: No cranial nerve deficit.     Motor: No abnormal muscle tone.     Coordination: Coordination normal.     Gait: Gait normal.     Deep Tendon Reflexes: Reflexes are normal and symmetric.  Psychiatric:        Behavior: Behavior normal.        Thought Content: Thought content normal.        Judgment: Judgment normal.    rectal - per GI due colon in 2021  Lab Results  Component Value Date   WBC 5.8 07/20/2013   HGB 11.4 (L) 07/20/2013   HCT 35.3 (L) 07/20/2013   PLT 258 07/20/2013   GLUCOSE 232 (H) 10/01/2018   CHOL 167 09/09/2016   TRIG 371.0 (H) 09/09/2016   HDL 31.40 (L) 09/09/2016   LDLDIRECT 73.0 09/09/2016   [place]  74 11/29/2013   ALT 14 05/07/2018   AST 10 05/07/2018   NA 137 10/01/2018   K 4.1 10/01/2018   CL 103 10/01/2018   CREATININE 1.39 10/01/2018   BUN 21 10/01/2018   CO2 25 10/01/2018   TSH 3.59 10/14/2010   PSA 0.99 09/09/2016   INR 1.15 01/21/2013   HGBA1C 6.9 (A) 07/15/2019   MICROALBUR <0.7 09/09/2016    DG Chest 2 View  Result Date: 07/14/2015 CLINICAL DATA:  Shortness of breath and wheezing for 2 weeks EXAM: CHEST  2 VIEW COMPARISON:  07/20/2013 FINDINGS: Cardiac shadow is stable. Postsurgical changes are again seen. The lungs are well aerated bilaterally without evidence of focal infiltrate or sizable effusion. Moderate degenerative changes of thoracic spine are noted. IMPRESSION: No active cardiopulmonary disease. Electronically Signed   By: Inez Catalina M.D.   On: 07/14/2015 08:25    Assessment & Plan:    Walker Kehr, MD

## 2019-11-14 NOTE — Assessment & Plan Note (Addendum)
PSA  rectal - per GI due colon in 2021

## 2019-11-14 NOTE — Assessment & Plan Note (Signed)
Wt Readings from Last 3 Encounters:  11/14/19 227 lb (103 kg)  08/14/19 232 lb (105.2 kg)  07/15/19 230 lb (104.3 kg)

## 2019-11-14 NOTE — Assessment & Plan Note (Signed)
On Lovastatin

## 2019-11-14 NOTE — Assessment & Plan Note (Signed)
Irbesartan HCT 

## 2019-11-14 NOTE — Assessment & Plan Note (Signed)
Labs

## 2019-11-18 ENCOUNTER — Ambulatory Visit (INDEPENDENT_AMBULATORY_CARE_PROVIDER_SITE_OTHER): Payer: Medicare Other | Admitting: Endocrinology

## 2019-11-18 ENCOUNTER — Encounter: Payer: Self-pay | Admitting: Endocrinology

## 2019-11-18 ENCOUNTER — Other Ambulatory Visit: Payer: Self-pay

## 2019-11-18 VITALS — BP 104/60 | HR 83 | Ht 71.0 in | Wt 227.8 lb

## 2019-11-18 DIAGNOSIS — E1159 Type 2 diabetes mellitus with other circulatory complications: Secondary | ICD-10-CM | POA: Diagnosis not present

## 2019-11-18 LAB — POCT GLYCOSYLATED HEMOGLOBIN (HGB A1C): Hemoglobin A1C: 7.8 % — AB (ref 4.0–5.6)

## 2019-11-18 NOTE — Progress Notes (Signed)
Subjective:    Patient ID: Johnny [name], male    DOB: 1946-03-05, 74 y.o.   MRN: [id number]  HPI Pt returns for f/u of diabetes mellitus:  DM type: 2 Dx'ed: 7517 Complications: CAD, PN, and CRI.   Therapy: 4 oral meds.  DKA: never Severe hypoglycemia: never Pancreatitis: never Pancreatic imaging: never SDOH: he cannot afford name brand meds.  Other: he has never been on insulin, except in the hospital, but he has learned how to take.  edema and CRI limit rx options and dosages.   Interval history: pt states he feels well in general.  He says cbg's vary from 98-190.  he says he never misses meds.  Past Medical History:  Diagnosis Date  . Arthritis   . At risk for sleep apnea    STOP-BANG=  6   SENT TO PCP 05-21-2013  . Benign positional vertigo   . Benign prostatic hypertrophy   . Blood transfusion without reported diagnosis 2000  . CAD (coronary artery disease)    CARDIOLOGIST--  DR Cristopher Peru  . CHF (congestive heart failure) ([place])   . Chronic ischemic heart disease, unspecified   . ED (erectile dysfunction) of organic origin   . GERD (gastroesophageal reflux disease)   . History of CHF (congestive heart failure)    systolic  . HTN (hypertension)   . Hyperlipidemia   . Type 2 diabetes mellitus ([place])   . Wears glasses     Past Surgical History:  Procedure Laterality Date  . CARDIAC CATHETERIZATION  03-03-2000  dr gregg taylor   normal lvsf/  two-vessel cad significant complex stenosis at distal left main  . CARDIOVASCULAR STRESS TEST  08-09-2010  DR Carleene Overlie TAYLOR   normal lexiscan nuclear study/  no ischemia/  normal lvf/  ef 54%  . CATARACT EXTRACTION W/ INTRAOCULAR LENS IMPLANT Right 2011  . COLONOSCOPY  12-30-2009  . CORONARY ARTERY BYPASS GRAFT  03-06-2000   DR [id number]   third vessel  . PENILE PROSTHESIS IMPLANT  02-08-2010   COLOPLAST 3-PIECE INFLATABLE  . PENILE PROSTHESIS IMPLANT N/A 05/27/2013   Procedure: CYSTO REMOVAL OF PENILE PROSTHESIS;  Surgeon:  Claybon Jabs, MD;  Location: [facility];  Service: Urology;  Laterality: N/A;  . POLYPECTOMY  12-30-2009   +TA  . TOTAL HIP ARTHROPLASTY  01/24/2011   Procedure: TOTAL HIP ARTHROPLASTY;  Surgeon: Dione Plover [name];  Location: [facility];  Service: Orthopedics;  Laterality: Left;  . TOTAL HIP ARTHROPLASTY Right 01/30/2013   Procedure: RIGHT TOTAL HIP ARTHROPLASTY;  Surgeon: Gearlean Alf, MD;  Location: [facility];  Service: Orthopedics;  Laterality: Right;    Social History   Socioeconomic History  . Marital status: Married    Spouse name: Not on file  . Number of children: Not on file  . Years of education: Not on file  . Highest education level: Not on file  Occupational History  . Occupation: Engineer, building services: RETIRED  Tobacco Use  . Smoking status: Former Smoker    Types: Cigarettes    Quit date: 05/22/1970    Years since quitting: 49.5  . Smokeless tobacco: Never Used  Substance and Sexual Activity  . Alcohol use: No    Alcohol/week: 0.0 standard drinks  . Drug use: No  . Sexual activity: Not on file  Other Topics Concern  . Not on file  Social History Narrative   Regular Exercise-No         Social Determinants of  Health   Financial Resource Strain:   . Difficulty of Paying Living Expenses: Not on file  Food Insecurity:   . Worried About Charity fundraiser in the Last Year: Not on file  . Ran Out of Food in the Last Year: Not on file  Transportation Needs:   . Lack of Transportation (Medical): Not on file  . Lack of Transportation (Non-Medical): Not on file  Physical Activity:   . Days of Exercise per Week: Not on file  . Minutes of Exercise per Session: Not on file  Stress:   . Feeling of Stress : Not on file  Social Connections:   . Frequency of Communication with Friends and Family: Not on file  . Frequency of Social Gatherings with Friends and Family: Not on file  . Attends Religious Services: Not on file  . Active Member of Clubs or  Organizations: Not on file  . Attends Archivist Meetings: Not on file  . Marital Status: Not on file  Intimate Partner Violence:   . Fear of Current or Ex-Partner: Not on file  . Emotionally Abused: Not on file  . Physically Abused: Not on file  . Sexually Abused: Not on file    Current Outpatient Medications on File Prior to Visit  Medication Sig Dispense Refill  . acarbose (PRECOSE) 25 MG tablet Take 1 tablet (25 mg total) by mouth 3 (three) times daily with meals. 270 tablet 3  . Alcohol Swabs (B-D SINGLE USE SWABS REGULAR) PADS 1 each by Does not apply route 2 (two) times daily as needed. Dx: 250.00 100 each 3  . allopurinol (ZYLOPRIM) 100 MG tablet Take 1 tablet (100 mg total) by mouth daily. 90 tablet 3  . aspirin EC 81 MG tablet Take 81 mg by mouth daily.    . Blood Glucose Calibration (ACCU-CHEK AVIVA) SOLN Use as directed as needed. 1 each 2  . bromocriptine (PARLODEL) 2.5 MG tablet Take 1 tablet by mouth once daily 30 tablet 0  . cholecalciferol (VITAMIN D) 1000 UNITS tablet Take 1,000 Units by mouth every morning.    . colchicine 0.6 MG tablet Take 1 tablet (0.6 mg total) by mouth daily. 90 tablet 3  . diazepam (VALIUM) 2 MG tablet Take 1-2 tablets (2-4 mg total) by mouth every 6 (six) hours as needed. Prn vertigo 60 tablet 0  . diclofenac (VOLTAREN) 75 MG EC tablet TAKE 1 TABLET BY MOUTH  TWICE A DAY AS NEEDED FOR  MODERATE PAIN (GOUT ATTACK) 180 tablet 3  . finasteride (PROSCAR) 5 MG tablet TAKE 1 TABLET BY MOUTH  EVERY MORNING 90 tablet 3  . fluticasone furoate-vilanterol (BREO ELLIPTA) 100-25 MCG/INH AEPB Inhale 1 puff into the lungs daily. 1 each 5  . irbesartan-hydrochlorothiazide (AVALIDE) 300-12.5 MG tablet TAKE 1 TABLET BY MOUTH  DAILY 90 tablet 3  . Lancet Devices (ACCU-CHEK SOFTCLIX) lancets Use twice daily as instructed. Dx: 250.00 1 each 3  . lovastatin (MEVACOR) 40 MG tablet TAKE 1 TABLET BY MOUTH  EVERY MORNING 90 tablet 3  . metFORMIN (GLUCOPHAGE) 500  MG tablet Take 1 tablet (500 mg total) by mouth daily with breakfast. 90 tablet 3  . Multiple Vitamins-Minerals (MULTI-VITAMIN GUMMIES PO)     . nitroGLYCERIN (NITROSTAT) 0.4 MG SL tablet Place 1 tablet (0.4 mg total) under the tongue every 5 (five) minutes as needed for chest pain. 25 tablet 3  . ONETOUCH ULTRA test strip USE TWICE DAILY AS  INSTRUCTED 200 strip 3  .  pantoprazole (PROTONIX) 40 MG tablet TAKE 1 TABLET BY MOUTH  EVERY MORNING 90 tablet 3  . repaglinide (PRANDIN) 2 MG tablet TAKE 1 TABLET BY MOUTH THREE TIMES DAILY BEFORE  A  MEAL 270 tablet 0  . tamsulosin (FLOMAX) 0.4 MG CAPS capsule TAKE 1 CAPSULE BY MOUTH  DAILY 90 capsule 3  . VENTOLIN HFA 108 (90 Base) MCG/ACT inhaler INHALE 2 PUFFS INTO THE LUNGS EVERY 4 HOURS AS NEEDED FOR WHEEZING OR SHORTNESS OF BREATH 18 g 5   No current facility-administered medications on file prior to visit.    Allergies  Allergen Reactions  . Enalapril     SOB  . Hydrocodone-Acetaminophen Nausea Only    Can take codein    Family History  Problem Relation Age of Onset  . Hypertension Mother   . Diabetes Father   . Cancer - Other Brother        cancer all over  . Coronary artery disease Other        1st degree male relative  . Hypertension Other   . Cancer Sister 31       ovarian ca  . Colon cancer Neg Hx     BP 104/60   Pulse 83   Ht 5\' 11"  (1.803 m)   Wt 227 lb 12.8 oz (103.3 kg)   SpO2 98%   BMI 31.77 kg/m    Review of Systems He denies hypoglycemia.      Objective:   Physical Exam VITAL SIGNS:  See vs page GENERAL: no distress Pulses: dorsalis pedis intact bilat.   MSK: no deformity of the feet CV: trace bilat leg edema Skin:  no ulcer on the feet.  normal color and temp on the feet. Neuro: sensation is intact to touch on the feet.   Ext: there is bilateral onychomycosis of the toenails   Lab Results  Component Value Date   HGBA1C 7.8 (A) 11/18/2019       Assessment & Plan:  Type 2 DM, with CRI: worse.   He declines to increase rx.  Please continue the same 4 DM medications  Patient Instructions  Please continue the same medications.   check your blood sugar once a day.  vary the time of day when you check, between before the 3 meals, and at bedtime.  also check if you have symptoms of your blood sugar being too high or too low.  please keep a record of the readings and bring it to your next appointment here (or you can bring the meter itself).  You can write it on any piece of paper.  please call us sooner if your blood sugar goes below 70, or if you have a lot of readings over 200. Please come back for a follow-up appointment in 2 months.

## 2019-11-18 NOTE — Patient Instructions (Addendum)
Please continue the same medications check your blood sugar once a day.  vary the time of day when you check, between before the 3 meals, and at bedtime.  also check if you have symptoms of your blood sugar being too high or too low.  please keep a record of the readings and bring it to your next appointment here (or you can bring the meter itself).  You can write it on any piece of paper.  please call us sooner if your blood sugar goes below 70, or if you have a lot of readings over 200.  Please come back for a follow-up appointment in 2 months.  

## 2019-11-28 ENCOUNTER — Telehealth: Payer: Self-pay | Admitting: Internal Medicine

## 2019-11-28 ENCOUNTER — Other Ambulatory Visit: Payer: Self-pay

## 2019-11-28 MED ORDER — BROMOCRIPTINE MESYLATE 2.5 MG PO TABS
2.5000 mg | ORAL_TABLET | Freq: Every day | ORAL | 0 refills | Status: DC
Start: 2019-11-28 — End: 2019-12-20

## 2019-11-28 NOTE — Telephone Encounter (Signed)
Patient's wife called and was wondering if a med refill foralbuterol (PROVENTIL) (2.5 MG/3ML) 0.083% nebulizer solution 5 mg  could be sent to his insurance company. It can be sent to [place], [address], [address].   Please call the pt back: [phone]

## 2019-11-29 MED ORDER — ALBUTEROL SULFATE HFA 108 (90 BASE) MCG/ACT IN AERS: INHALATION_SPRAY | RESPIRATORY_TRACT | 5 refills | Status: DC

## 2019-11-29 NOTE — Telephone Encounter (Signed)
See 11/29/19 med refill

## 2019-11-29 NOTE — Addendum Note (Signed)
Addended by: Lauralee Evener C on: 11/29/2019 10:16 AM   Modules accepted: Orders

## 2019-12-16 ENCOUNTER — Ambulatory Visit (INDEPENDENT_AMBULATORY_CARE_PROVIDER_SITE_OTHER): Payer: Medicare Other | Admitting: Internal Medicine

## 2019-12-16 ENCOUNTER — Encounter: Payer: Self-pay | Admitting: Internal Medicine

## 2019-12-16 ENCOUNTER — Other Ambulatory Visit: Payer: Self-pay

## 2019-12-16 VITALS — BP 142/74 | HR 67 | Temp 97.8°F | Ht 71.0 in | Wt 227.0 lb

## 2019-12-16 DIAGNOSIS — I824Z2 Acute embolism and thrombosis of unspecified deep veins of left distal lower extremity: Secondary | ICD-10-CM

## 2019-12-16 DIAGNOSIS — I10 Essential (primary) hypertension: Secondary | ICD-10-CM | POA: Diagnosis not present

## 2019-12-16 DIAGNOSIS — M79662 Pain in left lower leg: Secondary | ICD-10-CM | POA: Diagnosis not present

## 2019-12-16 DIAGNOSIS — E781 Pure hyperglyceridemia: Secondary | ICD-10-CM | POA: Insufficient documentation

## 2019-12-16 DIAGNOSIS — M7989 Other specified soft tissue disorders: Secondary | ICD-10-CM | POA: Diagnosis not present

## 2019-12-16 DIAGNOSIS — E1159 Type 2 diabetes mellitus with other circulatory complications: Secondary | ICD-10-CM

## 2019-12-16 DIAGNOSIS — E785 Hyperlipidemia, unspecified: Secondary | ICD-10-CM | POA: Insufficient documentation

## 2019-12-16 LAB — CBC WITH DIFFERENTIAL/PLATELET
Basophils Absolute: 0.1 10*3/uL (ref 0.0–0.1)
Basophils Relative: 1 % (ref 0.0–3.0)
Eosinophils Absolute: 0.3 10*3/uL (ref 0.0–0.7)
Eosinophils Relative: 3.5 % (ref 0.0–5.0)
HCT: 36.5 % — ABNORMAL LOW (ref 39.0–52.0)
Hemoglobin: 12.4 g/dL — ABNORMAL LOW (ref 13.0–17.0)
Lymphocytes Relative: 29.8 % (ref 12.0–46.0)
Lymphs Abs: 2.1 10*3/uL (ref 0.7–4.0)
MCHC: 33.8 g/dL (ref 30.0–36.0)
MCV: 84.8 fl (ref 78.0–100.0)
Monocytes Absolute: 0.6 10*3/uL (ref 0.1–1.0)
Monocytes Relative: 9 % (ref 3.0–12.0)
Neutro Abs: 4 10*3/uL (ref 1.4–7.7)
Neutrophils Relative %: 56.7 % (ref 43.0–77.0)
Platelets: 230 10*3/uL (ref 150.0–400.0)
RBC: 4.31 Mil/uL (ref 4.22–5.81)
RDW: 14.5 % (ref 11.5–15.5)
WBC: 7.1 10*3/uL (ref 4.0–10.5)

## 2019-12-16 LAB — BASIC METABOLIC PANEL
BUN: 12 mg/dL (ref 6–23)
CO2: 29 mEq/L (ref 19–32)
Calcium: 9.5 mg/dL (ref 8.4–10.5)
Chloride: 105 mEq/L (ref 96–112)
Creatinine, Ser: 1.26 mg/dL (ref 0.40–1.50)
GFR: 55.37 mL/min — ABNORMAL LOW (ref >60.00)
Glucose, Bld: 143 mg/dL — ABNORMAL HIGH (ref 70–99)
Potassium: 4.1 mEq/L (ref 3.5–5.1)
Sodium: 142 mEq/L (ref 135–145)

## 2019-12-16 LAB — D-DIMER, QUANTITATIVE: D-Dimer, Quant: 12.67 mcg/mL FEU — ABNORMAL HIGH (ref <0.50)

## 2019-12-16 MED ORDER — XARELTO VTE STARTER PACK 15 & 20 MG PO TBPK: ORAL_TABLET | ORAL | 0 refills | Status: DC

## 2019-12-16 NOTE — Progress Notes (Signed)
Subjective:  Patient ID: Johnny [name], male    DOB: May 17, 1945  Age: 74 y.o. MRN: [id number]  CC: Anemia  This visit occurred during the SARS-CoV-2 public health emergency.  Safety protocols were in place, including screening questions prior to the visit, additional usage of staff PPE, and extensive cleaning of exam room while observing appropriate contact time as indicated for disinfecting solutions.   NEW TO ME  HPI Johnny [name] presents for a several week history of pain and swelling in his left calf.  He denies injury or claudication.  Outpatient Medications Prior to Visit  Medication Sig Dispense Refill  . acarbose (PRECOSE) 25 MG tablet Take 1 tablet (25 mg total) by mouth 3 (three) times daily with meals. 270 tablet 3  . albuterol (VENTOLIN HFA) 108 (90 Base) MCG/ACT inhaler INHALE 2 PUFFS INTO THE LUNGS EVERY 4 HOURS AS NEEDED FOR WHEEZING OR SHORTNESS OF BREATH 18 g 5  . Alcohol Swabs (B-D SINGLE USE SWABS REGULAR) PADS 1 each by Does not apply route 2 (two) times daily as needed. Dx: 250.00 100 each 3  . allopurinol (ZYLOPRIM) 100 MG tablet Take 1 tablet (100 mg total) by mouth daily. 90 tablet 3  . aspirin EC 81 MG tablet Take 81 mg by mouth daily.    . Blood Glucose Calibration (ACCU-CHEK AVIVA) SOLN Use as directed as needed. 1 each 2  . bromocriptine (PARLODEL) 2.5 MG tablet Take 1 tablet (2.5 mg total) by mouth daily. 90 tablet 0  . cholecalciferol (VITAMIN D) 1000 UNITS tablet Take 1,000 Units by mouth every morning.    . colchicine 0.6 MG tablet Take 1 tablet (0.6 mg total) by mouth daily. 90 tablet 3  . finasteride (PROSCAR) 5 MG tablet TAKE 1 TABLET BY MOUTH  EVERY MORNING 90 tablet 3  . fluticasone furoate-vilanterol (BREO ELLIPTA) 100-25 MCG/INH AEPB Inhale 1 puff into the lungs daily. 1 each 5  . irbesartan-hydrochlorothiazide (AVALIDE) 300-12.5 MG tablet TAKE 1 TABLET BY MOUTH  DAILY 90 tablet 3  . Lancet Devices (ACCU-CHEK SOFTCLIX) lancets Use twice daily as  instructed. Dx: 250.00 1 each 3  . lovastatin (MEVACOR) 40 MG tablet TAKE 1 TABLET BY MOUTH  EVERY MORNING 90 tablet 3  . metFORMIN (GLUCOPHAGE) 500 MG tablet Take 1 tablet (500 mg total) by mouth daily with breakfast. 90 tablet 3  . Multiple Vitamins-Minerals (MULTI-VITAMIN GUMMIES PO)     . nitroGLYCERIN (NITROSTAT) 0.4 MG SL tablet Place 1 tablet (0.4 mg total) under the tongue every 5 (five) minutes as needed for chest pain. 25 tablet 3  . ONETOUCH ULTRA test strip USE TWICE DAILY AS  INSTRUCTED 200 strip 3  . pantoprazole (PROTONIX) 40 MG tablet TAKE 1 TABLET BY MOUTH  EVERY MORNING 90 tablet 3  . repaglinide (PRANDIN) 2 MG tablet TAKE 1 TABLET BY MOUTH THREE TIMES DAILY BEFORE  A  MEAL 270 tablet 0  . tamsulosin (FLOMAX) 0.4 MG CAPS capsule TAKE 1 CAPSULE BY MOUTH  DAILY 90 capsule 3  . diazepam (VALIUM) 2 MG tablet Take 1-2 tablets (2-4 mg total) by mouth every 6 (six) hours as needed. Prn vertigo 60 tablet 0  . diclofenac (VOLTAREN) 75 MG EC tablet TAKE 1 TABLET BY MOUTH  TWICE A DAY AS NEEDED FOR  MODERATE PAIN (GOUT ATTACK) 180 tablet 3   No facility-administered medications prior to visit.    ROS Review of Systems  Constitutional: Negative for chills and fatigue.  HENT: Negative.   Eyes:  Negative.   Respiratory: Negative for cough, chest tightness, shortness of breath and wheezing.   Cardiovascular: Positive for leg swelling. Negative for chest pain and palpitations.  Gastrointestinal: Negative for abdominal pain and blood in stool.  Endocrine: Negative.   Genitourinary: Negative.  Negative for difficulty urinating.  Musculoskeletal: Negative for arthralgias and myalgias.  Skin: Negative.  Negative for color change and pallor.  Neurological: Negative.  Negative for dizziness, weakness and numbness.  Hematological: Negative for adenopathy. Does not bruise/bleed easily.  Psychiatric/Behavioral: Negative.     Objective:  BP (!) 142/74   Pulse 67   Temp 97.8 F (36.6 C)  (Oral)   Ht 5\' 11"  (1.803 m)   Wt 227 lb (103 kg)   SpO2 99%   BMI 31.66 kg/m   BP Readings from Last 3 Encounters:  12/17/19 (!) 143/58  12/16/19 (!) 142/74  11/18/19 104/60    Wt Readings from Last 3 Encounters:  12/17/19 227 lb (103 kg)  12/16/19 227 lb (103 kg)  11/18/19 227 lb 12.8 oz (103.3 kg)    Physical Exam Vitals reviewed.  Constitutional:      Appearance: Normal appearance.  HENT:     Nose: Nose normal.     Mouth/Throat:     Mouth: Mucous membranes are moist.  Eyes:     General: No scleral icterus.    Conjunctiva/sclera: Conjunctivae normal.  Cardiovascular:     Rate and Rhythm: Normal rate.     Heart sounds: S1 normal and S2 normal. Murmur heard.  Systolic murmur is present with a grade of 1/6.  No diastolic murmur is present.  No gallop.   Pulmonary:     Effort: Pulmonary effort is normal.     Breath sounds: No stridor. No wheezing, rhonchi or rales.  Abdominal:     General: Abdomen is protuberant. Bowel sounds are normal.     Palpations: Abdomen is soft. There is no hepatomegaly, splenomegaly or mass.     Tenderness: There is no abdominal tenderness.  Musculoskeletal:     Cervical back: Neck supple.     Right lower leg: No edema.     Left lower leg: 2+ Edema present.  Lymphadenopathy:     Cervical: No cervical adenopathy.  Skin:    General: Skin is warm and dry.  Neurological:     General: No focal deficit present.     Mental Status: He is alert.  Psychiatric:        Mood and Affect: Mood normal.        Behavior: Behavior normal.     Lab Results  Component Value Date   WBC 7.1 12/16/2019   HGB 12.4 (L) 12/16/2019   HCT 36.5 (L) 12/16/2019   PLT 230.0 12/16/2019   GLUCOSE 143 (H) 12/16/2019   CHOL 167 09/09/2016   TRIG 371.0 (H) 09/09/2016   HDL 31.40 (L) 09/09/2016   LDLDIRECT 73.0 09/09/2016   LDLCALC 74 11/29/2013   ALT 14 05/07/2018   AST 10 05/07/2018   NA 142 12/16/2019   K 4.1 12/16/2019   CL 105 12/16/2019   CREATININE  1.26 12/16/2019   BUN 12 12/16/2019   CO2 29 12/16/2019   TSH 3.59 10/14/2010   PSA 1.0 11/14/2019   INR 1.15 01/21/2013   HGBA1C 7.8 (A) 11/18/2019   MICROALBUR <0.7 09/09/2016    DG Chest 2 View  Result Date: 07/14/2015 CLINICAL DATA:  Shortness of breath and wheezing for 2 weeks EXAM: CHEST  2 VIEW COMPARISON:  07/20/2013  FINDINGS: Cardiac shadow is stable. Postsurgical changes are again seen. The lungs are well aerated bilaterally without evidence of focal infiltrate or sizable effusion. Moderate degenerative changes of thoracic spine are noted. IMPRESSION: No active cardiopulmonary disease. Electronically Signed   By: Inez Catalina M.D.   On: 07/14/2015 08:25   VAS [place] LOWER EXTREMITY VENOUS (DVT)  Result Date: 12/17/2019  Lower Venous DVTStudy Indications: Right calf pain/ tightness.  Comparison Study: none Performing Technologist: June Leap RDMS, RVT  Examination Guidelines: A complete evaluation includes B-mode imaging, spectral Doppler, color Doppler, and power Doppler as needed of all accessible portions of each vessel. Bilateral testing is considered an integral part of a complete examination. Limited examinations for reoccurring indications may be performed as noted. The reflux portion of the exam is performed with the patient in reverse Trendelenburg.  +-----+---------------+---------+-----------+----------+--------------+ RIGHTCompressibilityPhasicitySpontaneityPropertiesThrombus Aging +-----+---------------+---------+-----------+----------+--------------+ CFV  Full           Yes      Yes                                 +-----+---------------+---------+-----------+----------+--------------+   +---------+---------------+---------+-----------+----------+--------------+ LEFT     CompressibilityPhasicitySpontaneityPropertiesThrombus Aging +---------+---------------+---------+-----------+----------+--------------+ CFV      Full           Yes      Yes                                  +---------+---------------+---------+-----------+----------+--------------+ SFJ      Full                                                        +---------+---------------+---------+-----------+----------+--------------+ FV Prox  Full                                                        +---------+---------------+---------+-----------+----------+--------------+ FV Mid   Partial        Yes      Yes                  Acute          +---------+---------------+---------+-----------+----------+--------------+ FV DistalNone           No       No                   Acute          +---------+---------------+---------+-----------+----------+--------------+ PFV      Full                                                        +---------+---------------+---------+-----------+----------+--------------+ POP      None           No       No                   Acute          +---------+---------------+---------+-----------+----------+--------------+  PTV      None                                         Acute          +---------+---------------+---------+-----------+----------+--------------+ PERO     None                                         Acute          +---------+---------------+---------+-----------+----------+--------------+ Gastroc  None                                         Acute          +---------+---------------+---------+-----------+----------+--------------+ GSV      Full                                                        +---------+---------------+---------+-----------+----------+--------------+    Findings reported to [facility] @ Dr. Ronnald Ramp' office at 3:18 pm.  Summary: RIGHT: - No evidence of common femoral vein obstruction.  LEFT: - Findings consistent with acute deep vein thrombosis involving the left femoral vein, left popliteal vein, left posterior tibial veins, left peroneal veins, and left gastrocnemius veins.  *See  table(s) above for measurements and observations. Electronically signed by Monica Martinez MD on 12/17/2019 at 4:40:56 PM.    Final     Assessment & Plan:   Danner was seen today for anemia.  Diagnoses and all orders for this visit:  Pure hyperglyceridemia  Hyperlipidemia LDL goal <70  Essential hypertension- His BP is adequately well controlled. -     CBC with Differential/Platelet; Future -     Cancel: BASIC METABOLIC PANEL WITH GFR; Future -     Basic metabolic panel; Future -     Basic metabolic panel -     CBC with Differential/Platelet  Pain and swelling of lower leg, left -     CBC with Differential/Platelet; Future -     Cancel: BASIC METABOLIC PANEL WITH GFR; Future -     Cancel: D-dimer, quantitative (not at [facility]); Future -     VAS [place] LOWER EXTREMITY VENOUS (DVT); Future -     D-dimer, quantitative (not at [facility]); Future -     Basic metabolic panel; Future -     Basic metabolic panel -     D-dimer, quantitative (not at [facility]) -     CBC with Differential/Platelet  DM type 2 causing vascular disease ([place]) -     HM Diabetes Foot Exam  Acute deep vein thrombosis (DVT) of distal vein of left lower extremity ([place])- Based on his symptoms, exam, elevated D-dimer, and ultrasound he has a significant deep venous thrombosis in his left lower extremity.  I recommended that he treat this with Xarelto but he tells me he is not willing to pay for Xarelto.  He would rather be admitted to the hospital for heparinization. -     Discontinue: Rivaroxaban Stater Pack, 15 mg and 20 mg, (XARELTO STARTER PACK); Follow package directions: Take  one 15mg  tablet by mouth twice a day. On day 22, switch to one 20mg  tablet once a day. Take with food. -     Rivaroxaban Stater Pack, 15 mg and 20 mg, (XARELTO STARTER PACK); Follow package directions: Take one 15mg  tablet by mouth twice a day. On day 22, switch to one 20mg  tablet once a day. Take with food.   I have discontinued Mylo Red. Mitchener's  diazepam and diclofenac. I am also having him maintain his cholecalciferol, Accu-Chek Aviva, accu-chek softclix, fluticasone furoate-vilanterol, Multiple Vitamins-Minerals (MULTI-VITAMIN GUMMIES PO), nitroGLYCERIN, B-D SINGLE USE SWABS REGULAR, metFORMIN, lovastatin, finasteride, pantoprazole, irbesartan-hydrochlorothiazide, aspirin EC, acarbose, tamsulosin, colchicine, allopurinol, repaglinide, OneTouch Ultra, bromocriptine, albuterol, and Xarelto Starter Pack.  Meds ordered this encounter  Medications  . DISCONTD: Rivaroxaban Stater Pack, 15 mg and 20 mg, (XARELTO STARTER PACK)    Sig: Follow package directions: Take one 15mg  tablet by mouth twice a day. On day 22, switch to one 20mg  tablet once a day. Take with food.    Dispense:  51 each    Refill:  0  . Rivaroxaban Stater Pack, 15 mg and 20 mg, (XARELTO STARTER PACK)    Sig: Follow package directions: Take one 15mg  tablet by mouth twice a day. On day 22, switch to one 20mg  tablet once a day. Take with food.    Dispense:  51 each    Refill:  0     Follow-up: Return in about 1 week (around 12/23/2019).  Scarlette Calico, MD

## 2019-12-16 NOTE — Patient Instructions (Signed)

## 2019-12-17 ENCOUNTER — Ambulatory Visit (HOSPITAL_COMMUNITY)
Admission: RE | Admit: 2019-12-17 | Discharge: 2019-12-17 | Disposition: A | Discharge status: Home / Self Care | Payer: Medicare Other | Source: Ambulatory Visit | Attending: Internal Medicine | Admitting: Internal Medicine

## 2019-12-17 ENCOUNTER — Emergency Department (HOSPITAL_COMMUNITY)
Admission: EM | Admit: 2019-12-17 | Discharge: 2019-12-17 | Disposition: A | Discharge status: Home / Self Care | Payer: Medicare Other | Attending: Emergency Medicine | Admitting: Emergency Medicine

## 2019-12-17 ENCOUNTER — Telehealth: Payer: Self-pay | Admitting: *Deleted

## 2019-12-17 ENCOUNTER — Encounter (HOSPITAL_COMMUNITY): Payer: Self-pay

## 2019-12-17 ENCOUNTER — Telehealth: Payer: Self-pay | Admitting: Internal Medicine

## 2019-12-17 ENCOUNTER — Other Ambulatory Visit: Payer: Self-pay

## 2019-12-17 DIAGNOSIS — I824Y2 Acute embolism and thrombosis of unspecified deep veins of left proximal lower extremity: Secondary | ICD-10-CM

## 2019-12-17 DIAGNOSIS — M79662 Pain in left lower leg: Secondary | ICD-10-CM | POA: Diagnosis not present

## 2019-12-17 DIAGNOSIS — I2510 Atherosclerotic heart disease of native coronary artery without angina pectoris: Secondary | ICD-10-CM | POA: Diagnosis not present

## 2019-12-17 DIAGNOSIS — Z7984 Long term (current) use of oral hypoglycemic drugs: Secondary | ICD-10-CM | POA: Insufficient documentation

## 2019-12-17 DIAGNOSIS — Z87891 Personal history of nicotine dependence: Secondary | ICD-10-CM | POA: Insufficient documentation

## 2019-12-17 DIAGNOSIS — I82402 Acute embolism and thrombosis of unspecified deep veins of left lower extremity: Secondary | ICD-10-CM | POA: Insufficient documentation

## 2019-12-17 DIAGNOSIS — R2242 Localized swelling, mass and lump, left lower limb: Secondary | ICD-10-CM | POA: Diagnosis not present

## 2019-12-17 DIAGNOSIS — I110 Hypertensive heart disease with heart failure: Secondary | ICD-10-CM | POA: Diagnosis not present

## 2019-12-17 DIAGNOSIS — Z7982 Long term (current) use of aspirin: Secondary | ICD-10-CM | POA: Insufficient documentation

## 2019-12-17 DIAGNOSIS — M7989 Other specified soft tissue disorders: Secondary | ICD-10-CM | POA: Diagnosis not present

## 2019-12-17 DIAGNOSIS — I509 Heart failure, unspecified: Secondary | ICD-10-CM | POA: Insufficient documentation

## 2019-12-17 DIAGNOSIS — E1159 Type 2 diabetes mellitus with other circulatory complications: Secondary | ICD-10-CM | POA: Insufficient documentation

## 2019-12-17 MED ORDER — XARELTO VTE STARTER PACK 15 & 20 MG PO TBPK: ORAL_TABLET | ORAL | 0 refills | Status: DC

## 2019-12-17 NOTE — Telephone Encounter (Signed)
Pt called back he states he will go to the ER.[name].([facility]).He can not afford the xarelto. Inform pt he need to go now and he stated he was on his way w/his wife...[name]

## 2019-12-17 NOTE — Telephone Encounter (Signed)
Patient's wife called and said that Rivaroxaban Stater Pack, 15 mg and 20 mg, (XARELTO STARTER PACK) Was $900 before insurance and after it was $223 and she said that they could not afford that. She also said that he was seeing the vascular doctor today at 3:00pm.    Please call back: [phone]

## 2019-12-17 NOTE — Telephone Encounter (Signed)
Pt had VAS U/S (L) Extremity done its Positive DVT. Put Sharee Pimple on hold to get MD advisement.  Per Dr. Ronnald Ramp pt states he could not afford the Xarelto, and if he can not take he will need to go to the hosp for heparin inj. Sharee Pimple states the pt is in the examine room but will relay message. Also inform her to have him to call the office and let us know what he is going to do.Marland KitchenJohny Chess

## 2019-12-17 NOTE — ED Triage Notes (Signed)
Patient states he has been having swelling and pain to the left foot to the left knee x 1 week. Patient went to the [facility] office today. Patient had an US done today and had showed a DVT to the left lower leg.

## 2019-12-17 NOTE — Discharge Instructions (Addendum)
Gout  Gout is a condition that causes painful swelling of the joints. Gout is a type of inflammation of the joints (arthritis). This condition is caused by having too much uric acid in the body. Uric acid is a chemical that forms when the body breaks down substances called purines. Purines are important for building body proteins. When the body has too much uric acid, sharp crystals can form and build up inside the joints. This causes pain and swelling. Gout attacks can happen quickly and may be very painful (acute gout). Over time, the attacks can affect more joints and become more frequent (chronic gout). Gout can also cause uric acid to build up under the skin and inside the kidneys. What are the causes? This condition is caused by too much uric acid in your blood. This can happen because:  Your kidneys do not remove enough uric acid from your blood. This is the most common cause.  Your body makes too much uric acid. This can happen with some cancers and cancer treatments. It can also occur if your body is breaking down too many red blood cells (hemolytic anemia).  You eat too many foods that are high in purines. These foods include organ meats and some seafood. Alcohol, especially beer, is also high in purines. A gout attack may be triggered by trauma or stress. What increases the risk? You are more likely to develop this condition if you:  Have a family history of gout.  Are male and middle-aged.  Are male and have gone through menopause.  Are obese.  Frequently drink alcohol, especially beer.  Are dehydrated.  Lose weight too quickly.  Have an organ transplant.  Have lead poisoning.  Take certain medicines, including aspirin, cyclosporine, diuretics, levodopa, and niacin.  Have kidney disease.  Have a skin condition called psoriasis. What are the signs or symptoms? An attack of acute gout happens quickly. It usually occurs in just one joint. The most common place is  the big toe. Attacks often start at night. Other joints that may be affected include joints of the feet, ankle, knee, fingers, wrist, or elbow. Symptoms of this condition may include:  Severe pain.  Warmth.  Swelling.  Stiffness.  Tenderness. The affected joint may be very painful to touch.  Shiny, red, or purple skin.  Chills and fever. Chronic gout may cause symptoms more frequently. More joints may be involved. You may also have white or yellow lumps (tophi) on your hands or feet or in other areas near your joints. How is this diagnosed? This condition is diagnosed based on your symptoms, medical history, and physical exam. You may have tests, such as:  Blood tests to measure uric acid levels.  Removal of joint fluid with a thin needle (aspiration) to look for uric acid crystals.  X-rays to look for joint damage. How is this treated? Treatment for this condition has two phases: treating an acute attack and preventing future attacks. Acute gout treatment may include medicines to reduce pain and swelling, including:  NSAIDs.  Steroids. These are strong anti-inflammatory medicines that can be taken by mouth (orally) or injected into a joint.  Colchicine. This medicine relieves pain and swelling when it is taken soon after an attack. It can be given by mouth or through an IV. Preventive treatment may include:  Daily use of smaller doses of NSAIDs or colchicine.  Use of a medicine that reduces uric acid levels in your blood.  Changes to your diet. You may   need to see a dietitian about what to eat and drink to prevent gout. Follow these instructions at home: During a gout attack   If directed, put ice on the affected area: ? Put ice in a plastic bag. ? Place a towel between your skin and the bag. ? Leave the ice on for 20 minutes, 2-3 times a day.  Raise (elevate) the affected joint above the level of your heart as often as possible.  Rest the joint as much as possible.  If the affected joint is in your leg, you may be given crutches to use.  Follow instructions from your health care provider about eating or drinking restrictions. Avoiding future gout attacks  Follow a low-purine diet as told by your dietitian or health care provider. Avoid foods and drinks that are high in purines, including liver, kidney, anchovies, asparagus, herring, mushrooms, mussels, and beer.  Maintain a healthy weight or lose weight if you are overweight. If you want to lose weight, talk with your health care provider. It is important that you do not lose weight too quickly.  Start or maintain an exercise program as told by your health care provider. Eating and drinking  Drink enough fluids to keep your urine pale yellow.  If you drink alcohol: ? Limit how much you use to:  0-1 drink a day for women.  0-2 drinks a day for men. ? Be aware of how much alcohol is in your drink. In the [place] one drink equals one 12 oz bottle of beer (355 mL) one 5 oz glass of wine (148 mL), or one 1 oz glass of hard liquor (44 mL). General instructions  Take over-the-counter and prescription medicines only as told by your health care provider.  Do not drive or use heavy machinery while taking prescription pain medicine.  Return to your normal activities as told by your health care provider. Ask your health care provider what activities are safe for you.  Keep all follow-up visits as told by your health care provider. This is important. Contact a health care provider if you have:  Another gout attack.  Continuing symptoms of a gout attack after 10 days of treatment.  Side effects from your medicines.  Chills or a fever.  Burning pain when you urinate.  Pain in your lower back or belly. Get help right away if you:  Have severe or uncontrolled pain.  Cannot urinate. Summary  Gout is painful swelling of the joints caused by inflammation.  The most common site of pain is the big  toe, but it can affect other joints in the body.  Medicines and dietary changes can help to prevent and treat gout attacks. This information is not intended to replace advice given to you by your health care provider. Make sure you discuss any questions you have with your health care provider. Document Revised: 09/13/2017 Document Reviewed: 09/13/2017 Elsevier Patient Education  [place] to the pharmacy at [facility] at [employer].  You have a free month of Xarelto.  Your PCP can help arrange other anticoagulation during that time.

## 2019-12-17 NOTE — Progress Notes (Signed)
LE venous duplex completed. Called positive results to [place] at Dr. Ronnald Ramp' office.   Patient informed that if he cannot afford the Xarelto prescription that was sent in that per Dr. Ronnald Ramp he will need to go to the hospital today to get evaluated and treated. Patient was told to call office back and let Lorre Nick know what he is doing. Patient wants to call wife first but then will call the office. Message relayed at 3:30 pm and patient left office.   June Leap, BS, RDMS, RVT

## 2019-12-17 NOTE — ED Provider Notes (Signed)
[facility] DEPT Provider Note   CSN: [id number] Arrival date & time: 12/17/19  1619     History Chief Complaint  Patient presents with   DVT    Johnny [name] is a 74 y.o. male.  HPI Patient presents after being sent from PCP for DVT in the left lower leg.  Around a week he has had some swelling in his left lower extremity.  No pain.  No difficulty breathing.  No chest pain.  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  PCP had ordered Xarelto starter pack, however found to be too expensive for patient with a take-home cost of $230 monthly.  PCP then sent patient into the hospital for admission to the hospital to be admitted for heparinization and presumed Coumadin.  Patient does not smoke.  No recent immobility.  No known active cancer.    Past Medical History:  Diagnosis Date   Arthritis    At risk for sleep apnea    STOP-BANG=  6   SENT TO PCP 05-21-2013   Benign positional vertigo    Benign prostatic hypertrophy    Blood transfusion without reported diagnosis 2000   CAD (coronary artery disease)    CARDIOLOGIST--  DR Cristopher Peru   CHF (congestive heart failure) (HCC)    Chronic ischemic heart disease, unspecified    ED (erectile dysfunction) of organic origin    GERD (gastroesophageal reflux disease)    History of CHF (congestive heart failure)    systolic   HTN (hypertension)    Hyperlipidemia    Type 2 diabetes mellitus ([place])    Wears glasses     Patient Active Problem List   Diagnosis Date Noted   Pure hyperglyceridemia 12/16/2019   Hyperlipidemia LDL goal <70 12/16/2019   Pain and swelling of lower leg, left 12/16/2019   Erectile dysfunction 07/13/2015   Well adult exam 06/12/2015   OA (osteoarthritis) of hip 01/30/2013   Osteoarthritis of right hip 10/19/2012   Primary osteoarthritis of left hip 01/26/2011   COSTOCHONDRITIS  08/10/2009   TOBACCO USE, QUIT 12/23/2008   BLADDER OUTLET OBSTRUCTION 10/24/2008   DEGENERATIVE JOINT DISEASE, LEFT HIP 10/08/2007   ISCHEMIC HEART DISEASE 08/07/2007   Arthropathy 08/07/2007   DM type 2 causing vascular disease ([place]) 03/02/2007   Essential hypertension 03/02/2007   Coronary atherosclerosis 03/02/2007   GERD 03/02/2007    Past Surgical History:  Procedure Laterality Date   CARDIAC CATHETERIZATION  03-03-2000  dr gregg taylor   normal lvsf/  two-vessel cad significant complex stenosis at distal left main   CARDIOVASCULAR STRESS TEST  08-09-2010  DR Carleene Overlie TAYLOR   normal lexiscan nuclear study/  no ischemia/  normal lvf/  ef 54%   CATARACT EXTRACTION W/ INTRAOCULAR LENS IMPLANT Right 2011   COLONOSCOPY  12-30-2009   CORONARY ARTERY BYPASS GRAFT  03-06-2000   DR GERHARDT   third vessel   PENILE PROSTHESIS IMPLANT  02-08-2010   COLOPLAST 3-PIECE INFLATABLE   PENILE PROSTHESIS IMPLANT N/A 05/27/2013   Procedure: CYSTO REMOVAL OF PENILE PROSTHESIS;  Surgeon: Claybon Jabs, MD;  Location: [facility];  Service: Urology;  Laterality: N/A;   POLYPECTOMY  12-30-2009   +TA   TOTAL HIP ARTHROPLASTY  01/24/2011   Procedure: TOTAL HIP ARTHROPLASTY;  Surgeon: Dione Plover [name];  Location: [facility];  Service: Orthopedics;  Laterality: Left;   TOTAL HIP ARTHROPLASTY Right 01/30/2013  Procedure: RIGHT TOTAL HIP ARTHROPLASTY;  Surgeon: Gearlean Alf, MD;  Location: [facility];  Service: Orthopedics;  Laterality: Right;       Family History  Problem Relation Age of Onset   Hypertension Mother    Diabetes Father    Cancer - Other Brother        cancer all over   Coronary artery disease Other        1st degree male relative   Hypertension Other    Cancer Sister 39       ovarian ca   Colon cancer Neg Hx     Social History   Tobacco Use   Smoking status: Former Smoker    Types: Cigarettes    Quit date: 05/22/1970    Years since  quitting: 49.6   Smokeless tobacco: Never Used  Vaping Use   Vaping Use: Never used  Substance Use Topics   Alcohol use: No    Alcohol/week: 0.0 standard drinks   Drug use: No    Home Medications Prior to Admission medications   Medication Sig Start Date End Date Taking? Authorizing Provider  acarbose (PRECOSE) 25 MG tablet Take 1 tablet (25 mg total) by mouth 3 (three) times daily with meals. 04/15/19   Renato Shin, MD  albuterol (VENTOLIN HFA) 108 (90 Base) MCG/ACT inhaler INHALE 2 PUFFS INTO THE LUNGS EVERY 4 HOURS AS NEEDED FOR WHEEZING OR SHORTNESS OF BREATH 11/29/19   Plotnikov, Evie Lacks, MD  Alcohol Swabs (B-D SINGLE USE SWABS REGULAR) PADS 1 each by Does not apply route 2 (two) times daily as needed. Dx: 250.00 10/18/17   Plotnikov, Evie Lacks, MD  allopurinol (ZYLOPRIM) 100 MG tablet Take 1 tablet (100 mg total) by mouth daily. 08/14/19   Plotnikov, Evie Lacks, MD  aspirin EC 81 MG tablet Take 81 mg by mouth daily.    [provider]  Blood Glucose Calibration (ACCU-CHEK AVIVA) SOLN Use as directed as needed. 10/16/14   Plotnikov, Evie Lacks, MD  bromocriptine (PARLODEL) 2.5 MG tablet Take 1 tablet (2.5 mg total) by mouth daily. 11/28/19   Renato Shin, MD  cholecalciferol (VITAMIN D) 1000 UNITS tablet Take 1,000 Units by mouth every morning.    [provider]  colchicine 0.6 MG tablet Take 1 tablet (0.6 mg total) by mouth daily. 08/14/19   Plotnikov, Evie Lacks, MD  finasteride (PROSCAR) 5 MG tablet TAKE 1 TABLET BY MOUTH  EVERY MORNING 11/30/18   Plotnikov, Evie Lacks, MD  fluticasone furoate-vilanterol (BREO ELLIPTA) 100-25 MCG/INH AEPB Inhale 1 puff into the lungs daily. 07/13/15   Plotnikov, Evie Lacks, MD  irbesartan-hydrochlorothiazide (AVALIDE) 300-12.5 MG tablet TAKE 1 TABLET BY MOUTH  DAILY 11/30/18   Plotnikov, Evie Lacks, MD  Lancet Devices [facility]) lancets Use twice daily as instructed. Dx: 250.00 10/24/14   Plotnikov, Evie Lacks, MD  lovastatin  (MEVACOR) 40 MG tablet TAKE 1 TABLET BY MOUTH  EVERY MORNING 11/30/18   Plotnikov, Evie Lacks, MD  metFORMIN (GLUCOPHAGE) 500 MG tablet Take 1 tablet (500 mg total) by mouth daily with breakfast. 09/26/18   Renato Shin, MD  Multiple Vitamins-Minerals (MULTI-VITAMIN GUMMIES PO)  04/15/16   [provider]  nitroGLYCERIN (NITROSTAT) 0.4 MG SL tablet Place 1 tablet (0.4 mg total) under the tongue every 5 (five) minutes as needed for chest pain. 09/01/16   Evans Lance, MD  [facility] ULTRA test strip USE TWICE DAILY AS  INSTRUCTED 10/20/19   Plotnikov, Evie Lacks, MD  pantoprazole (PROTONIX) 40 MG  tablet TAKE 1 TABLET BY MOUTH  EVERY MORNING 11/30/18   Plotnikov, Evie Lacks, MD  repaglinide (PRANDIN) 2 MG tablet TAKE 1 TABLET BY MOUTH THREE TIMES DAILY BEFORE  A  MEAL 09/20/19   Renato Shin, MD  Rivaroxaban Stater Pack, 15 mg and 20 mg, (XARELTO STARTER PACK) Follow package directions: Take one 15mg  tablet by mouth twice a day. On day 22, switch to one 20mg  tablet once a day. Take with food. 12/17/19   Janith Lima, MD  tamsulosin (FLOMAX) 0.4 MG CAPS capsule TAKE 1 CAPSULE BY MOUTH  DAILY 05/06/19   Plotnikov, Evie Lacks, MD    Allergies    Enalapril and Hydrocodone-acetaminophen  Review of Systems   Review of Systems  Constitutional: Negative for appetite change.  HENT: Negative for congestion.   Respiratory: Negative for shortness of breath.   Cardiovascular: Positive for leg swelling. Negative for chest pain.  Gastrointestinal: Negative for abdominal pain.  Musculoskeletal: Negative for back pain.  Skin: Negative for rash.  Neurological: Negative for weakness.    Physical Exam Updated Vital Signs BP (!) 143/58 (BP Location: Left Arm)    Pulse (!) 56    Temp 98.1 F (36.7 C) (Oral)    Resp 16    Ht 5\' 11"  (1.803 m)    Wt 103 kg    SpO2 100%    BMI 31.66 kg/m   Physical Exam Vitals and nursing note reviewed.  HENT:     Head: Atraumatic.  Eyes:     Pupils: Pupils are equal,  round, and reactive to light.  Cardiovascular:     Rate and Rhythm: Regular rhythm.  Pulmonary:     Breath sounds: No wheezing or rhonchi.  Abdominal:     Tenderness: There is no abdominal tenderness.  Musculoskeletal:     Cervical back: Neck supple.     Left lower leg: Edema present.     Comments: Edema to left lower extremity.  No erythema.  No induration.  No pain with movement.  No color change.  Skin:    General: Skin is warm.     Capillary Refill: Capillary refill takes less than 2 seconds.  Neurological:     Mental Status: He is alert and oriented to person, place, and time.     ED Results / Procedures / Treatments   Labs (all labs ordered are listed, but only abnormal results are displayed) Labs Reviewed - No data to display  EKG None  Radiology VAS [place] LOWER EXTREMITY VENOUS (DVT)  Result Date: 12/17/2019  Lower Venous DVTStudy Indications: Right calf pain/ tightness.  Comparison Study: none Performing Technologist: June Leap RDMS, RVT  Examination Guidelines: A complete evaluation includes B-mode imaging, spectral Doppler, color Doppler, and power Doppler as needed of all accessible portions of each vessel. Bilateral testing is considered an integral part of a complete examination. Limited examinations for reoccurring indications may be performed as noted. The reflux portion of the exam is performed with the patient in reverse Trendelenburg.  +-----+---------------+---------+-----------+----------+--------------+  RIGHT Compressibility Phasicity Spontaneity Properties Thrombus Aging  +-----+---------------+---------+-----------+----------+--------------+  CFV   Full            Yes       Yes                                    +-----+---------------+---------+-----------+----------+--------------+   +---------+---------------+---------+-----------+----------+--------------+  LEFT      Compressibility Phasicity Spontaneity  Properties Thrombus Aging   +---------+---------------+---------+-----------+----------+--------------+  CFV       Full            Yes       Yes                                    +---------+---------------+---------+-----------+----------+--------------+  SFJ       Full                                                             +---------+---------------+---------+-----------+----------+--------------+  FV Prox   Full                                                             +---------+---------------+---------+-----------+----------+--------------+  FV Mid    Partial         Yes       Yes                    Acute           +---------+---------------+---------+-----------+----------+--------------+  FV Distal None            No        No                     Acute           +---------+---------------+---------+-----------+----------+--------------+  PFV       Full                                                             +---------+---------------+---------+-----------+----------+--------------+  POP       None            No        No                     Acute           +---------+---------------+---------+-----------+----------+--------------+  PTV       None                                             Acute           +---------+---------------+---------+-----------+----------+--------------+  PERO      None                                             Acute           +---------+---------------+---------+-----------+----------+--------------+  Gastroc   None  Acute           +---------+---------------+---------+-----------+----------+--------------+  GSV       Full                                                             +---------+---------------+---------+-----------+----------+--------------+    Findings reported to [facility] @ Dr. Ronnald Ramp' office at 3:18 pm.  Summary: RIGHT: - No evidence of common femoral vein obstruction.  LEFT: - Findings consistent with acute deep vein thrombosis involving  the left femoral vein, left popliteal vein, left posterior tibial veins, left peroneal veins, and left gastrocnemius veins.  *See table(s) above for measurements and observations. Electronically signed by Monica Martinez MD on 12/17/2019 at 4:40:56 PM.    Final     Procedures Procedures (including critical care time)  Medications Ordered in ED Medications - No data to display  ED Course  I have reviewed the triage vital signs and the nursing notes.  Pertinent labs & imaging results that were available during my care of the patient were reviewed by me and considered in my medical decision making (see chart for details).    MDM Rules/Calculators/A&P                          Patient with DVT.  Reportedly on able to afford Xarelto.  Sent in for admission to the hospital for heparin and then Coumadin.  DVT is somewhat large but does not appear to need directed thrombolytics.  Doubt pulmonary embolism.  No chest pain no shortness of breath.  Discussed with both vascular surgery and pharmacy.  Also with care management.  Patient's been given a coupon for 1 free month of Xarelto.  Can be picked up today until 9:00 at the [facility]  During that month PCP can help arrange for alternate anticoagulation such as a transition to Coumadin or some other anticoagulation that should be able to be done as an outpatient. Final Clinical Impression(s) / ED Diagnoses Final diagnoses:  None    Rx / DC Orders ED Discharge Orders    None       Davonna Belling, MD 12/17/19 1815

## 2019-12-17 NOTE — Progress Notes (Signed)
..   Transition of Care [facility]) - Emergency Department Mini Assessment   Patient Details  Name: Johnny [name] MRN: [id number] Date of Birth: 1945-05-04  Transition of Care [facility]) CM/SW Contact:    Erenest Rasher, RN Phone Number: [phone] 12/17/2019, 7:01 PM   Clinical Narrative:  TOC CM spoke to pt and wife at bedside. Contacted his [place] and provided 30 day free trial card to pharmacist. His copay was $230 under his Med D. Pt will contact [facility] Medicare tomorrow to check why it was so expensive. Xarelto is a covered medication under [facility] Medicare for $37 or $47. Provided pt and wife with Xarelto 30 day free trial card and explained he may qualify for patient assistance through drug company. He will follow up with PCP in one week. Pt states he was sent to hospital be admitted for DVT. Pt able to receive Xarelto today at $0 copay. Provided pt with information on foods to avoid with gout. Discussed diet and foods. Pt states he has loss weight and trying to adhere to a healthy diet. States he may not be drinking enough water. ED provider updated.   ED Mini Assessment: What brought you to the Emergency Department? : leg swelling  Barriers to Discharge: No Barriers Identified     Means of departure: Car  Interventions which prevented an admission or readmission: Medication Review, Education about diagnosis    Patient Contact and Communications        ,          Patient states their goals for this hospitalization and ongoing recovery are:: will follow up with PCP, and take Xarelto as scheduled      Admission diagnosis:  Blood clot in leg Patient Active Problem List   Diagnosis Date Noted  . Pure hyperglyceridemia 12/16/2019  . Hyperlipidemia LDL goal <70 12/16/2019  . Pain and swelling of lower leg, left 12/16/2019  . Erectile dysfunction 07/13/2015  . Well adult exam 06/12/2015  . OA (osteoarthritis) of hip 01/30/2013  . Osteoarthritis of right hip  10/19/2012  . Primary osteoarthritis of left hip 01/26/2011  . COSTOCHONDRITIS 08/10/2009  . TOBACCO USE, QUIT 12/23/2008  . BLADDER OUTLET OBSTRUCTION 10/24/2008  . DEGENERATIVE JOINT DISEASE, LEFT HIP 10/08/2007  . ISCHEMIC HEART DISEASE 08/07/2007  . Arthropathy 08/07/2007  . DM type 2 causing vascular disease ([place]) 03/02/2007  . Essential hypertension 03/02/2007  . Coronary atherosclerosis 03/02/2007  . GERD 03/02/2007   PCP:  Cassandria Anger, MD Pharmacy:   [facility], [address] [place], [address] [place], [place] [address] Phone: [phone] Fax: [phone]  [place] - [address] [place] [address] Phone: [phone] Fax: [phone]

## 2019-12-19 ENCOUNTER — Other Ambulatory Visit: Payer: Self-pay | Admitting: Internal Medicine

## 2019-12-20 ENCOUNTER — Other Ambulatory Visit: Payer: Self-pay | Admitting: Endocrinology

## 2019-12-20 DIAGNOSIS — I10 Essential (primary) hypertension: Secondary | ICD-10-CM | POA: Diagnosis not present

## 2019-12-25 ENCOUNTER — Encounter: Payer: Self-pay | Admitting: Internal Medicine

## 2019-12-25 ENCOUNTER — Ambulatory Visit (INDEPENDENT_AMBULATORY_CARE_PROVIDER_SITE_OTHER): Payer: Medicare Other | Admitting: Internal Medicine

## 2019-12-25 ENCOUNTER — Other Ambulatory Visit: Payer: Self-pay

## 2019-12-25 VITALS — BP 132/66 | HR 72 | Temp 98.2°F | Resp 16 | Ht 71.0 in | Wt 228.0 lb

## 2019-12-25 DIAGNOSIS — Z23 Encounter for immunization: Secondary | ICD-10-CM

## 2019-12-25 DIAGNOSIS — I82412 Acute embolism and thrombosis of left femoral vein: Secondary | ICD-10-CM | POA: Diagnosis not present

## 2019-12-25 NOTE — Patient Instructions (Signed)

## 2019-12-27 NOTE — Progress Notes (Signed)
Subjective:  Patient ID: Johnny [name], male    DOB: 11/13/1945  Age: 74 y.o. MRN: [id number]  CC: Follow-up  This visit occurred during the SARS-CoV-2 public health emergency.  Safety protocols were in place, including screening questions prior to the visit, additional usage of staff PPE, and extensive cleaning of exam room while observing appropriate contact time as indicated for disinfecting solutions.    HPI Johnny [name] presents for f/up - I saw him about 10 days ago for pain and swelling in the left lower extremity.  He was subsequently diagnosed with a proximal left lower extremity deep venous thrombosis.  He tells me he is compliant with the DOAC.  He says the pain and the swelling in the left lower extremity has improved.  He denies claudication.  He now tells me that the symptoms started after he had been on an airplane for over 4 hours going to [facility].  Outpatient Medications Prior to Visit  Medication Sig Dispense Refill  . acarbose (PRECOSE) 25 MG tablet Take 1 tablet (25 mg total) by mouth 3 (three) times daily with meals. 270 tablet 3  . albuterol (VENTOLIN HFA) 108 (90 Base) MCG/ACT inhaler INHALE 2 PUFFS INTO THE LUNGS EVERY 4 HOURS AS NEEDED FOR WHEEZING OR SHORTNESS OF BREATH 18 g 5  . Alcohol Swabs (B-D SINGLE USE SWABS REGULAR) PADS 1 each by Does not apply route 2 (two) times daily as needed. Dx: 250.00 100 each 3  . allopurinol (ZYLOPRIM) 100 MG tablet Take 1 tablet (100 mg total) by mouth daily. 90 tablet 3  . aspirin EC 81 MG tablet Take 81 mg by mouth daily.    . Blood Glucose Calibration (ACCU-CHEK AVIVA) SOLN Use as directed as needed. 1 each 2  . bromocriptine (PARLODEL) 2.5 MG tablet Take 1 tablet by mouth once daily 90 tablet 0  . cholecalciferol (VITAMIN D) 1000 UNITS tablet Take 1,000 Units by mouth every morning.    . colchicine 0.6 MG tablet Take 1 tablet (0.6 mg total) by mouth daily. 90 tablet 3  . diclofenac (VOLTAREN) 75 MG EC tablet TAKE 1  TABLET BY MOUTH  TWICE DAILY AS NEEDED FOR  MODERATE PAIN 180 tablet 3  . finasteride (PROSCAR) 5 MG tablet TAKE 1 TABLET BY MOUTH IN  THE MORNING 90 tablet 3  . fluticasone furoate-vilanterol (BREO ELLIPTA) 100-25 MCG/INH AEPB Inhale 1 puff into the lungs daily. 1 each 5  . irbesartan-hydrochlorothiazide (AVALIDE) 300-12.5 MG tablet TAKE 1 TABLET BY MOUTH  DAILY 90 tablet 3  . Lancet Devices (ACCU-CHEK SOFTCLIX) lancets Use twice daily as instructed. Dx: 250.00 1 each 3  . lovastatin (MEVACOR) 40 MG tablet TAKE 1 TABLET BY MOUTH IN  THE MORNING 90 tablet 3  . metFORMIN (GLUCOPHAGE) 500 MG tablet Take 1 tablet (500 mg total) by mouth daily with breakfast. 90 tablet 3  . Multiple Vitamins-Minerals (MULTI-VITAMIN GUMMIES PO)     . nitroGLYCERIN (NITROSTAT) 0.4 MG SL tablet Place 1 tablet (0.4 mg total) under the tongue every 5 (five) minutes as needed for chest pain. 25 tablet 3  . ONETOUCH ULTRA test strip USE TWICE DAILY AS  INSTRUCTED 200 strip 3  . pantoprazole (PROTONIX) 40 MG tablet TAKE 1 TABLET BY MOUTH IN  THE MORNING 90 tablet 3  . repaglinide (PRANDIN) 2 MG tablet TAKE 1 TABLET BY MOUTH THREE TIMES DAILY BEFORE  A  MEAL 270 tablet 0  . Rivaroxaban Stater Pack, 15 mg and 20 mg, (  XARELTO STARTER PACK) Follow package directions: Take one 15mg  tablet by mouth twice a day. On day 22, switch to one 20mg  tablet once a day. Take with food. 51 each 0  . tamsulosin (FLOMAX) 0.4 MG CAPS capsule TAKE 1 CAPSULE BY MOUTH  DAILY 90 capsule 3   No facility-administered medications prior to visit.    ROS Review of Systems  Constitutional: Negative for diaphoresis and fatigue.  HENT: Negative.   Eyes: Negative.   Respiratory: Negative for cough, chest tightness, shortness of breath and wheezing.   Cardiovascular: Positive for leg swelling. Negative for chest pain and palpitations.  Gastrointestinal: Negative for abdominal pain, blood in stool and vomiting.  Genitourinary: Negative.  Negative for  difficulty urinating.  Musculoskeletal: Negative.   Skin: Negative.   Neurological: Negative.  Negative for dizziness, weakness, light-headedness and numbness.  Hematological: Negative for adenopathy. Does not bruise/bleed easily.  Psychiatric/Behavioral: Negative.     Objective:  BP 132/66   Pulse 72   Temp 98.2 F (36.8 C) (Oral)   Resp 16   Ht 5\' 11"  (1.803 m)   Wt 228 lb (103.4 kg)   SpO2 97%   BMI 31.80 kg/m   BP Readings from Last 3 Encounters:  12/25/19 132/66  12/17/19 (!) 156/70  12/16/19 (!) 142/74    Wt Readings from Last 3 Encounters:  12/25/19 228 lb (103.4 kg)  12/17/19 227 lb (103 kg)  12/16/19 227 lb (103 kg)    Physical Exam Vitals reviewed.  Constitutional:      Appearance: Normal appearance.  HENT:     Nose: Nose normal.     Mouth/Throat:     Mouth: Mucous membranes are moist.  Eyes:     General: No scleral icterus.    Pupils: Pupils are equal, round, and reactive to light.  Cardiovascular:     Rate and Rhythm: Normal rate and regular rhythm.     Pulses:          Carotid pulses are 1+ on the right side and 1+ on the left side.      Radial pulses are 1+ on the right side and 1+ on the left side.       Femoral pulses are 1+ on the right side and 1+ on the left side.      Popliteal pulses are 1+ on the right side and 1+ on the left side.       Dorsalis pedis pulses are 1+ on the right side and 1+ on the left side.       Posterior tibial pulses are 1+ on the right side and 1+ on the left side.     Heart sounds: No murmur heard.   Pulmonary:     Effort: Pulmonary effort is normal.     Breath sounds: No stridor. No wheezing, rhonchi or rales.  Abdominal:     General: Abdomen is protuberant. Bowel sounds are normal. There is no distension.     Palpations: Abdomen is soft. There is no hepatomegaly, splenomegaly or mass.     Tenderness: There is no abdominal tenderness.  Musculoskeletal:     Cervical back: Neck supple.     Right lower leg: No  edema.     Left lower leg: 2+ Edema present.  Lymphadenopathy:     Cervical: No cervical adenopathy.  Skin:    General: Skin is warm.     Coloration: Skin is not pale.  Neurological:     General: No focal deficit present.  Mental Status: He is alert.  Psychiatric:        Mood and Affect: Mood normal.        Behavior: Behavior normal.     Lab Results  Component Value Date   WBC 7.1 12/16/2019   HGB 12.4 (L) 12/16/2019   HCT 36.5 (L) 12/16/2019   PLT 230.0 12/16/2019   GLUCOSE 143 (H) 12/16/2019   CHOL 167 09/09/2016   TRIG 371.0 (H) 09/09/2016   HDL 31.40 (L) 09/09/2016   LDLDIRECT 73.0 09/09/2016   LDLCALC 74 11/29/2013   ALT 14 05/07/2018   AST 10 05/07/2018   NA 142 12/16/2019   K 4.1 12/16/2019   CL 105 12/16/2019   CREATININE 1.26 12/16/2019   BUN 12 12/16/2019   CO2 29 12/16/2019   TSH 3.59 10/14/2010   PSA 1.0 11/14/2019   INR 1.15 01/21/2013   HGBA1C 7.8 (A) 11/18/2019   MICROALBUR <0.7 09/09/2016    VAS [place] LOWER EXTREMITY VENOUS (DVT)  Result Date: 12/17/2019  Lower Venous DVTStudy Indications: Right calf pain/ tightness.  Comparison Study: none Performing Technologist: June Leap RDMS, RVT  Examination Guidelines: A complete evaluation includes B-mode imaging, spectral Doppler, color Doppler, and power Doppler as needed of all accessible portions of each vessel. Bilateral testing is considered an integral part of a complete examination. Limited examinations for reoccurring indications may be performed as noted. The reflux portion of the exam is performed with the patient in reverse Trendelenburg.  +-----+---------------+---------+-----------+----------+--------------+ RIGHTCompressibilityPhasicitySpontaneityPropertiesThrombus Aging +-----+---------------+---------+-----------+----------+--------------+ CFV  Full           Yes      Yes                                 +-----+---------------+---------+-----------+----------+--------------+    +---------+---------------+---------+-----------+----------+--------------+ LEFT     CompressibilityPhasicitySpontaneityPropertiesThrombus Aging +---------+---------------+---------+-----------+----------+--------------+ CFV      Full           Yes      Yes                                 +---------+---------------+---------+-----------+----------+--------------+ SFJ      Full                                                        +---------+---------------+---------+-----------+----------+--------------+ FV Prox  Full                                                        +---------+---------------+---------+-----------+----------+--------------+ FV Mid   Partial        Yes      Yes                  Acute          +---------+---------------+---------+-----------+----------+--------------+ FV DistalNone           No       No                   Acute          +---------+---------------+---------+-----------+----------+--------------+ PFV  Full                                                        +---------+---------------+---------+-----------+----------+--------------+ POP      None           No       No                   Acute          +---------+---------------+---------+-----------+----------+--------------+ PTV      None                                         Acute          +---------+---------------+---------+-----------+----------+--------------+ PERO     None                                         Acute          +---------+---------------+---------+-----------+----------+--------------+ Gastroc  None                                         Acute          +---------+---------------+---------+-----------+----------+--------------+ GSV      Full                                                        +---------+---------------+---------+-----------+----------+--------------+    Findings reported to [facility] @ Dr. Ronnald Ramp' office  at 3:18 pm.  Summary: RIGHT: - No evidence of common femoral vein obstruction.  LEFT: - Findings consistent with acute deep vein thrombosis involving the left femoral vein, left popliteal vein, left posterior tibial veins, left peroneal veins, and left gastrocnemius veins.  *See table(s) above for measurements and observations. Electronically signed by Monica Martinez MD on 12/17/2019 at 4:40:56 PM.    Final     Assessment & Plan:   Goro was seen today for follow-up.  Diagnoses and all orders for this visit:  Flu vaccine need -     Flu Vaccine QUAD High Dose(Fluad)  DVT of deep femoral vein, left (Magnet)- He agrees to complete the high-dose Xarelto.  When he runs out of that he will let me know and then I will with Xarelto at 20 mg a day for 6 months.  It sounds like the precipitating factor was a long air flight.  He was advised to elevate the left upper extremity and do activities as tolerated.   I am having Mylo Red. Storck maintain his cholecalciferol, Accu-Chek Aviva, accu-chek softclix, fluticasone furoate-vilanterol, Multiple Vitamins-Minerals (MULTI-VITAMIN GUMMIES PO), nitroGLYCERIN, B-D SINGLE USE SWABS REGULAR, metFORMIN, aspirin EC, acarbose, tamsulosin, colchicine, allopurinol, repaglinide, OneTouch Ultra, albuterol, Xarelto Starter Pack, diclofenac, lovastatin, pantoprazole, finasteride, irbesartan-hydrochlorothiazide, and bromocriptine.  No orders of the defined types were placed in this encounter.    Follow-up: Return in about 6 months (around 06/24/2020).  Marcello Moores  Ronnald Ramp, MD

## 2020-01-02 ENCOUNTER — Encounter: Payer: Self-pay | Admitting: Internal Medicine

## 2020-01-02 ENCOUNTER — Ambulatory Visit (INDEPENDENT_AMBULATORY_CARE_PROVIDER_SITE_OTHER): Payer: Medicare Other | Admitting: Internal Medicine

## 2020-01-02 ENCOUNTER — Other Ambulatory Visit: Payer: Self-pay

## 2020-01-02 DIAGNOSIS — I824Y2 Acute embolism and thrombosis of unspecified deep veins of left proximal lower extremity: Secondary | ICD-10-CM | POA: Diagnosis not present

## 2020-01-02 DIAGNOSIS — E1159 Type 2 diabetes mellitus with other circulatory complications: Secondary | ICD-10-CM

## 2020-01-02 DIAGNOSIS — I2510 Atherosclerotic heart disease of native coronary artery without angina pectoris: Secondary | ICD-10-CM

## 2020-01-02 MED ORDER — RIVAROXABAN 20 MG PO TABS: 20.0000 mg | ORAL_TABLET | Freq: Every day | ORAL | 5 refills | Status: DC

## 2020-01-02 NOTE — Progress Notes (Signed)
Subjective:  Patient ID: Johnny [name], male    DOB: [birth year]  Age: 74 y.o. MRN: [id number]  CC: Follow-up ((L) leg swollen. Saw Dr, Ronnald Ramp and was dx w/blood clot. Also want Pt Assistance Form Completed for Xarelto)   HPI Johnny [name] presents for LLE DVT  Outpatient Medications Prior to Visit  Medication Sig Dispense Refill  . acarbose (PRECOSE) 25 MG tablet Take 1 tablet (25 mg total) by mouth 3 (three) times daily with meals. 270 tablet 3  . albuterol (VENTOLIN HFA) 108 (90 Base) MCG/ACT inhaler INHALE 2 PUFFS INTO THE LUNGS EVERY 4 HOURS AS NEEDED FOR WHEEZING OR SHORTNESS OF BREATH 18 g 5  . Alcohol Swabs (B-D SINGLE USE SWABS REGULAR) PADS 1 each by Does not apply route 2 (two) times daily as needed. Dx: 250.00 100 each 3  . allopurinol (ZYLOPRIM) 100 MG tablet Take 1 tablet (100 mg total) by mouth daily. 90 tablet 3  . aspirin EC 81 MG tablet Take 81 mg by mouth daily.    . Blood Glucose Calibration (ACCU-CHEK AVIVA) SOLN Use as directed as needed. 1 each 2  . bromocriptine (PARLODEL) 2.5 MG tablet Take 1 tablet by mouth once daily 90 tablet 0  . cholecalciferol (VITAMIN D) 1000 UNITS tablet Take 1,000 Units by mouth every morning.    . colchicine 0.6 MG tablet Take 1 tablet (0.6 mg total) by mouth daily. 90 tablet 3  . diclofenac (VOLTAREN) 75 MG EC tablet TAKE 1 TABLET BY MOUTH  TWICE DAILY AS NEEDED FOR  MODERATE PAIN 180 tablet 3  . finasteride (PROSCAR) 5 MG tablet TAKE 1 TABLET BY MOUTH IN  THE MORNING 90 tablet 3  . fluticasone furoate-vilanterol (BREO ELLIPTA) 100-25 MCG/INH AEPB Inhale 1 puff into the lungs daily. 1 each 5  . irbesartan-hydrochlorothiazide (AVALIDE) 300-12.5 MG tablet TAKE 1 TABLET BY MOUTH  DAILY 90 tablet 3  . Lancet Devices (ACCU-CHEK SOFTCLIX) lancets Use twice daily as instructed. Dx: 250.00 1 each 3  . lovastatin (MEVACOR) 40 MG tablet TAKE 1 TABLET BY MOUTH IN  THE MORNING 90 tablet 3  . metFORMIN (GLUCOPHAGE) 500 MG tablet Take 1 tablet (500  mg total) by mouth daily with breakfast. 90 tablet 3  . Multiple Vitamins-Minerals (MULTI-VITAMIN GUMMIES PO)     . nitroGLYCERIN (NITROSTAT) 0.4 MG SL tablet Place 1 tablet (0.4 mg total) under the tongue every 5 (five) minutes as needed for chest pain. 25 tablet 3  . ONETOUCH ULTRA test strip USE TWICE DAILY AS  INSTRUCTED 200 strip 3  . pantoprazole (PROTONIX) 40 MG tablet TAKE 1 TABLET BY MOUTH IN  THE MORNING 90 tablet 3  . repaglinide (PRANDIN) 2 MG tablet TAKE 1 TABLET BY MOUTH THREE TIMES DAILY BEFORE  A  MEAL 270 tablet 0  . Rivaroxaban Stater Pack, 15 mg and 20 mg, (XARELTO STARTER PACK) Follow package directions: Take one 15mg  tablet by mouth twice a day. On day 22, switch to one 20mg  tablet once a day. Take with food. 51 each 0  . tamsulosin (FLOMAX) 0.4 MG CAPS capsule TAKE 1 CAPSULE BY MOUTH  DAILY 90 capsule 3   No facility-administered medications prior to visit.    ROS: Review of Systems  Constitutional: Negative for appetite change, fatigue and unexpected weight change.  HENT: Negative for congestion, nosebleeds, sneezing, sore throat and trouble swallowing.   Eyes: Negative for itching and visual disturbance.  Respiratory: Negative for cough.   Cardiovascular: Positive for leg swelling.  Negative for chest pain and palpitations.  Gastrointestinal: Negative for abdominal distention, blood in stool, diarrhea and nausea.  Genitourinary: Negative for frequency and hematuria.  Musculoskeletal: Negative for back pain, gait problem, joint swelling and neck pain.  Skin: Negative for rash.  Neurological: Negative for dizziness, tremors, speech difficulty and weakness.  Psychiatric/Behavioral: Negative for agitation, dysphoric mood and sleep disturbance. The patient is not nervous/anxious.     Objective:  BP 112/62 (BP Location: Left Arm)   Pulse 68   Temp 98.5 F (36.9 C) (Oral)   Wt 225 lb 9.6 oz (102.3 kg)   SpO2 98%   BMI 31.46 kg/m   BP Readings from Last 3  Encounters:  01/02/20 112/62  12/25/19 132/66  12/17/19 (!) 156/70    Wt Readings from Last 3 Encounters:  01/02/20 225 lb 9.6 oz (102.3 kg)  12/25/19 228 lb (103.4 kg)  12/17/19 227 lb (103 kg)    Physical Exam Constitutional:      General: He is not in acute distress.    Appearance: He is well-developed.     Comments: NAD  Eyes:     Conjunctiva/sclera: Conjunctivae normal.     Pupils: Pupils are equal, round, and reactive to light.  Neck:     Thyroid: No thyromegaly.     Vascular: No JVD.  Cardiovascular:     Rate and Rhythm: Normal rate and regular rhythm.     Heart sounds: Normal heart sounds. No murmur heard.  No friction rub. No gallop.   Pulmonary:     Effort: Pulmonary effort is normal. No respiratory distress.     Breath sounds: Normal breath sounds. No wheezing or rales.  Chest:     Chest Grigg: No tenderness.  Abdominal:     General: Bowel sounds are normal. There is no distension.     Palpations: Abdomen is soft. There is no mass.     Tenderness: There is no abdominal tenderness. There is no guarding or rebound.  Musculoskeletal:        General: No tenderness. Normal range of motion.     Cervical back: Normal range of motion.     Right lower leg: No edema.     Left lower leg: Edema present.  Lymphadenopathy:     Cervical: No cervical adenopathy.  Skin:    General: Skin is warm and dry.     Findings: No rash.  Neurological:     Mental Status: He is alert and oriented to person, place, and time.     Cranial Nerves: No cranial nerve deficit.     Motor: No abnormal muscle tone.     Coordination: Coordination normal.     Gait: Gait normal.     Deep Tendon Reflexes: Reflexes are normal and symmetric.  Psychiatric:        Behavior: Behavior normal.        Thought Content: Thought content normal.        Judgment: Judgment normal.      A total time of >25 minutes was spent preparing to see the patient, reviewing tests, [place] report and ER records.  Also,  obtaining history and performing comprehensive physical exam.  Additionally, counseling the patient regarding the above listed issues.   Finally, documenting clinical information in the health records, coordination of care, educating the patient. It is a complex case.  Lab Results  Component Value Date   WBC 7.1 12/16/2019   HGB 12.4 (L) 12/16/2019   HCT 36.5 (L) 12/16/2019  PLT 230.0 12/16/2019   GLUCOSE 143 (H) 12/16/2019   CHOL 167 09/09/2016   TRIG 371.0 (H) 09/09/2016   HDL 31.40 (L) 09/09/2016   LDLDIRECT 73.0 09/09/2016   LDLCALC 74 11/29/2013   ALT 14 05/07/2018   AST 10 05/07/2018   NA 142 12/16/2019   K 4.1 12/16/2019   CL 105 12/16/2019   CREATININE 1.26 12/16/2019   BUN 12 12/16/2019   CO2 29 12/16/2019   TSH 3.59 10/14/2010   PSA 1.0 11/14/2019   INR 1.15 01/21/2013   HGBA1C 7.8 (A) 11/18/2019   MICROALBUR <0.7 09/09/2016    VAS [place] LOWER EXTREMITY VENOUS (DVT)  Result Date: 12/17/2019  Lower Venous DVTStudy Indications: Right calf pain/ tightness.  Comparison Study: none Performing Technologist: June Leap RDMS, RVT  Examination Guidelines: A complete evaluation includes B-mode imaging, spectral Doppler, color Doppler, and power Doppler as needed of all accessible portions of each vessel. Bilateral testing is considered an integral part of a complete examination. Limited examinations for reoccurring indications may be performed as noted. The reflux portion of the exam is performed with the patient in reverse Trendelenburg.  +-----+---------------+---------+-----------+----------+--------------+ RIGHTCompressibilityPhasicitySpontaneityPropertiesThrombus Aging +-----+---------------+---------+-----------+----------+--------------+ CFV  Full           Yes      Yes                                 +-----+---------------+---------+-----------+----------+--------------+   +---------+---------------+---------+-----------+----------+--------------+ LEFT      CompressibilityPhasicitySpontaneityPropertiesThrombus Aging +---------+---------------+---------+-----------+----------+--------------+ CFV      Full           Yes      Yes                                 +---------+---------------+---------+-----------+----------+--------------+ SFJ      Full                                                        +---------+---------------+---------+-----------+----------+--------------+ FV Prox  Full                                                        +---------+---------------+---------+-----------+----------+--------------+ FV Mid   Partial        Yes      Yes                  Acute          +---------+---------------+---------+-----------+----------+--------------+ FV DistalNone           No       No                   Acute          +---------+---------------+---------+-----------+----------+--------------+ PFV      Full                                                        +---------+---------------+---------+-----------+----------+--------------+  POP      None           No       No                   Acute          +---------+---------------+---------+-----------+----------+--------------+ PTV      None                                         Acute          +---------+---------------+---------+-----------+----------+--------------+ PERO     None                                         Acute          +---------+---------------+---------+-----------+----------+--------------+ Gastroc  None                                         Acute          +---------+---------------+---------+-----------+----------+--------------+ GSV      Full                                                        +---------+---------------+---------+-----------+----------+--------------+    Findings reported to [facility] @ Dr. Ronnald Ramp' office at 3:18 pm.  Summary: RIGHT: - No evidence of common femoral vein obstruction.  LEFT:  - Findings consistent with acute deep vein thrombosis involving the left femoral vein, left popliteal vein, left posterior tibial veins, left peroneal veins, and left gastrocnemius veins.  *See table(s) above for measurements and observations. Electronically signed by Monica Martinez MD on 12/17/2019 at 4:40:56 PM.    Final     Assessment & Plan:   Walker Kehr, MD

## 2020-01-02 NOTE — Patient Instructions (Signed)
Knee high compression sock (travel sock)

## 2020-01-10 ENCOUNTER — Other Ambulatory Visit: Payer: Self-pay | Admitting: Internal Medicine

## 2020-01-13 DIAGNOSIS — I82402 Acute embolism and thrombosis of unspecified deep veins of left lower extremity: Secondary | ICD-10-CM | POA: Insufficient documentation

## 2020-01-14 NOTE — Assessment & Plan Note (Addendum)
Continue Xarelto for at least 6 months.  DVT etiologies and evaluation were discussed  12/17/19 Doppler US Summary:  RIGHT:  - No evidence of common femoral vein obstruction.    LEFT:  - Findings consistent with acute deep vein thrombosis involving the left  femoral vein, left popliteal vein, left posterior tibial veins, left  peroneal veins, and left gastrocnemius veins.

## 2020-01-14 NOTE — Assessment & Plan Note (Signed)
No CP 

## 2020-01-14 NOTE — Assessment & Plan Note (Signed)
monitor CBGs

## 2020-01-17 ENCOUNTER — Ambulatory Visit (INDEPENDENT_AMBULATORY_CARE_PROVIDER_SITE_OTHER): Payer: Medicare Other | Admitting: Endocrinology

## 2020-01-17 ENCOUNTER — Encounter: Payer: Self-pay | Admitting: Endocrinology

## 2020-01-17 ENCOUNTER — Other Ambulatory Visit: Payer: Self-pay

## 2020-01-17 VITALS — BP 130/88 | HR 69 | Ht 71.0 in | Wt 228.8 lb

## 2020-01-17 DIAGNOSIS — E1159 Type 2 diabetes mellitus with other circulatory complications: Secondary | ICD-10-CM

## 2020-01-17 LAB — POCT GLYCOSYLATED HEMOGLOBIN (HGB A1C): Hemoglobin A1C: 7.7 % — AB (ref 4.0–5.6)

## 2020-01-17 MED ORDER — REPAGLINIDE 1 MG PO TABS: 1.0000 mg | ORAL_TABLET | Freq: Three times a day (TID) | ORAL | 3 refills | Status: DC

## 2020-01-17 NOTE — Progress Notes (Signed)
Subjective:    Patient ID: Johnny [name], male    DOB: 1945-12-20, 74 y.o.   MRN: [id number]  HPI Pt returns for f/u of diabetes mellitus:  DM type: 2 Dx'ed: 4098 Complications: CAD, PN, and CRI.   Therapy: 4 oral meds.  DKA: never Severe hypoglycemia: never Pancreatitis: never Pancreatic imaging: never SDOH: he cannot afford name brand meds.  Other: he has never been on insulin, except in the hospital, but he has learned how to take.  edema and CRI limit rx options and dosages.   Interval history: pt states he feels well in general.  He says cbg's vary from 93-200.  he says he never misses meds.   Past Medical History:  Diagnosis Date  . Arthritis   . At risk for sleep apnea    STOP-BANG=  6   SENT TO PCP 05-21-2013  . Benign positional vertigo   . Benign prostatic hypertrophy   . Blood transfusion without reported diagnosis 2000  . CAD (coronary artery disease)    CARDIOLOGIST--  DR Cristopher Peru  . CHF (congestive heart failure) ([place])   . Chronic ischemic heart disease, unspecified   . ED (erectile dysfunction) of organic origin   . GERD (gastroesophageal reflux disease)   . History of CHF (congestive heart failure)    systolic  . HTN (hypertension)   . Hyperlipidemia   . Type 2 diabetes mellitus ([place])   . Wears glasses     Past Surgical History:  Procedure Laterality Date  . CARDIAC CATHETERIZATION  03-03-2000  dr gregg taylor   normal lvsf/  two-vessel cad significant complex stenosis at distal left main  . CARDIOVASCULAR STRESS TEST  08-09-2010  DR Carleene Overlie TAYLOR   normal lexiscan nuclear study/  no ischemia/  normal lvf/  ef 54%  . CATARACT EXTRACTION W/ INTRAOCULAR LENS IMPLANT Right 2011  . COLONOSCOPY  12-30-2009  . CORONARY ARTERY BYPASS GRAFT  03-06-2000   DR [id number]   third vessel  . PENILE PROSTHESIS IMPLANT  02-08-2010   COLOPLAST 3-PIECE INFLATABLE  . PENILE PROSTHESIS IMPLANT N/A 05/27/2013   Procedure: CYSTO REMOVAL OF PENILE PROSTHESIS;   Surgeon: Claybon Jabs, MD;  Location: [facility];  Service: Urology;  Laterality: N/A;  . POLYPECTOMY  12-30-2009   +TA  . TOTAL HIP ARTHROPLASTY  01/24/2011   Procedure: TOTAL HIP ARTHROPLASTY;  Surgeon: Dione Plover [name];  Location: [facility];  Service: Orthopedics;  Laterality: Left;  . TOTAL HIP ARTHROPLASTY Right 01/30/2013   Procedure: RIGHT TOTAL HIP ARTHROPLASTY;  Surgeon: Gearlean Alf, MD;  Location: [facility];  Service: Orthopedics;  Laterality: Right;    Social History   Socioeconomic History  . Marital status: Married    Spouse name: Not on file  . Number of children: Not on file  . Years of education: Not on file  . Highest education level: Not on file  Occupational History  . Occupation: Engineer, building services: RETIRED  Tobacco Use  . Smoking status: Former Smoker    Types: Cigarettes    Quit date: 05/22/1970    Years since quitting: 49.6  . Smokeless tobacco: Never Used  Vaping Use  . Vaping Use: Never used  Substance and Sexual Activity  . Alcohol use: No    Alcohol/week: 0.0 standard drinks  . Drug use: No  . Sexual activity: Not on file  Other Topics Concern  . Not on file  Social History Narrative   Regular Exercise-No  Social Determinants of Health   Financial Resource Strain:   . Difficulty of Paying Living Expenses: Not on file  Food Insecurity:   . Worried About Charity fundraiser in the Last Year: Not on file  . Ran Out of Food in the Last Year: Not on file  Transportation Needs:   . Lack of Transportation (Medical): Not on file  . Lack of Transportation (Non-Medical): Not on file  Physical Activity:   . Days of Exercise per Week: Not on file  . Minutes of Exercise per Session: Not on file  Stress:   . Feeling of Stress : Not on file  Social Connections:   . Frequency of Communication with Friends and Family: Not on file  . Frequency of Social Gatherings with Friends and Family: Not on file  . Attends Religious  Services: Not on file  . Active Member of Clubs or Organizations: Not on file  . Attends Archivist Meetings: Not on file  . Marital Status: Not on file  Intimate Partner Violence:   . Fear of Current or Ex-Partner: Not on file  . Emotionally Abused: Not on file  . Physically Abused: Not on file  . Sexually Abused: Not on file    Current Outpatient Medications on File Prior to Visit  Medication Sig Dispense Refill  . acarbose (PRECOSE) 25 MG tablet Take 1 tablet (25 mg total) by mouth 3 (three) times daily with meals. 270 tablet 3  . albuterol (VENTOLIN HFA) 108 (90 Base) MCG/ACT inhaler INHALE 2 PUFFS INTO THE LUNGS EVERY 4 HOURS AS NEEDED FOR WHEEZING OR SHORTNESS OF BREATH 18 g 5  . Alcohol Swabs (B-D SINGLE USE SWABS REGULAR) PADS 1 each by Does not apply route 2 (two) times daily as needed. Dx: 250.00 100 each 3  . allopurinol (ZYLOPRIM) 100 MG tablet Take 1 tablet (100 mg total) by mouth daily. 90 tablet 3  . aspirin EC 81 MG tablet Take 81 mg by mouth daily.    . Blood Glucose Calibration (ACCU-CHEK AVIVA) SOLN Use as directed as needed. 1 each 2  . bromocriptine (PARLODEL) 2.5 MG tablet Take 1 tablet by mouth once daily 90 tablet 0  . cholecalciferol (VITAMIN D) 1000 UNITS tablet Take 1,000 Units by mouth every morning.    . colchicine 0.6 MG tablet Take 1 tablet (0.6 mg total) by mouth daily. 90 tablet 3  . diclofenac (VOLTAREN) 75 MG EC tablet TAKE 1 TABLET BY MOUTH  TWICE DAILY AS NEEDED FOR  MODERATE PAIN 180 tablet 3  . finasteride (PROSCAR) 5 MG tablet TAKE 1 TABLET BY MOUTH IN  THE MORNING 90 tablet 3  . fluticasone furoate-vilanterol (BREO ELLIPTA) 100-25 MCG/INH AEPB Inhale 1 puff into the lungs daily. 1 each 5  . irbesartan-hydrochlorothiazide (AVALIDE) 300-12.5 MG tablet TAKE 1 TABLET BY MOUTH  DAILY 90 tablet 3  . Lancet Devices (ACCU-CHEK SOFTCLIX) lancets Use twice daily as instructed. Dx: 250.00 1 each 3  . Lancets (ONETOUCH DELICA PLUS [id number]) MISC USE  TWICE DAILY AS DIRECTED 200 each 3  . lovastatin (MEVACOR) 40 MG tablet TAKE 1 TABLET BY MOUTH IN  THE MORNING 90 tablet 3  . metFORMIN (GLUCOPHAGE) 500 MG tablet Take 1 tablet (500 mg total) by mouth daily with breakfast. 90 tablet 3  . Multiple Vitamins-Minerals (MULTI-VITAMIN GUMMIES PO)     . nitroGLYCERIN (NITROSTAT) 0.4 MG SL tablet Place 1 tablet (0.4 mg total) under the tongue every 5 (five) minutes as needed for  chest pain. 25 tablet 3  . ONETOUCH ULTRA test strip USE TWICE DAILY AS  INSTRUCTED 200 strip 3  . pantoprazole (PROTONIX) 40 MG tablet TAKE 1 TABLET BY MOUTH IN  THE MORNING 90 tablet 3  . rivaroxaban (XARELTO) 20 MG TABS tablet Take 1 tablet (20 mg total) by mouth daily. 30 tablet 5  . tamsulosin (FLOMAX) 0.4 MG CAPS capsule TAKE 1 CAPSULE BY MOUTH  DAILY 90 capsule 3   No current facility-administered medications on file prior to visit.    Allergies  Allergen Reactions  . Enalapril     SOB  . Hydrocodone-Acetaminophen Nausea Only    Can take codein    Family History  Problem Relation Age of Onset  . Hypertension Mother   . Diabetes Father   . Cancer - Other Brother        cancer all over  . Coronary artery disease Other        1st degree male relative  . Hypertension Other   . Cancer Sister 26       ovarian ca  . Colon cancer Neg Hx     BP 130/88   Pulse 69   Ht 5\' 11"  (1.803 m)   Wt 228 lb 12.8 oz (103.8 kg)   SpO2 97%   BMI 31.91 kg/m    Review of Systems     Objective:   Physical Exam VITAL SIGNS:  See vs page GENERAL: no distress Pulses: dorsalis pedis intact bilat.   MSK: no deformity of the feet CV: trace right and 2+ left leg edema (recent DVT) Skin:  no ulcer on the feet.  normal color and temp on the feet. Neuro: sensation is intact to touch on the feet.   Ext: there is bilateral onychomycosis of the toenails   Lab Results  Component Value Date   HGBA1C 5.8 (A) 01/17/2020       Assessment & Plan:  HTN: is noted today Type  2 DM, with CRI: overcontrolled.    Patient Instructions  Your blood pressure is high today.  Please see your primary care provider soon, to have it rechecked I have sent a prescription to your pharmacy, to reduce the repaglinide. Please continue the same other medications.   check your blood sugar once a day.  vary the time of day when you check, between before the 3 meals, and at bedtime.  also check if you have symptoms of your blood sugar being too high or too low.  please keep a record of the readings and bring it to your next appointment here (or you can bring the meter itself).  You can write it on any piece of paper.  please call us sooner if your blood sugar goes below 70, or if you have a lot of readings over 200. Please come back for a follow-up appointment in 3 months.

## 2020-01-17 NOTE — Patient Instructions (Addendum)
Your blood pressure is high today.  Please see your primary care provider soon, to have it rechecked I have sent a prescription to your pharmacy, to reduce the repaglinide. Please continue the same other medications.   check your blood sugar once a day.  vary the time of day when you check, between before the 3 meals, and at bedtime.  also check if you have symptoms of your blood sugar being too high or too low.  please keep a record of the readings and bring it to your next appointment here (or you can bring the meter itself).  You can write it on any piece of paper.  please call us sooner if your blood sugar goes below 70, or if you have a lot of readings over 200. Please come back for a follow-up appointment in 3 months.

## 2020-02-04 ENCOUNTER — Ambulatory Visit (INDEPENDENT_AMBULATORY_CARE_PROVIDER_SITE_OTHER): Payer: Medicare Other | Admitting: Internal Medicine

## 2020-02-04 ENCOUNTER — Other Ambulatory Visit: Payer: Self-pay

## 2020-02-04 ENCOUNTER — Encounter: Payer: Self-pay | Admitting: Internal Medicine

## 2020-02-04 DIAGNOSIS — E1159 Type 2 diabetes mellitus with other circulatory complications: Secondary | ICD-10-CM | POA: Diagnosis not present

## 2020-02-04 DIAGNOSIS — I824Y2 Acute embolism and thrombosis of unspecified deep veins of left proximal lower extremity: Secondary | ICD-10-CM

## 2020-02-04 LAB — BASIC METABOLIC PANEL
BUN: 16 mg/dL (ref 6–23)
CO2: 30 mEq/L (ref 19–32)
Calcium: 9.2 mg/dL (ref 8.4–10.5)
Chloride: 101 mEq/L (ref 96–112)
Creatinine, Ser: 1.34 mg/dL (ref 0.40–1.50)
GFR: 51.9 mL/min — ABNORMAL LOW (ref >60.00)
Glucose, Bld: 219 mg/dL — ABNORMAL HIGH (ref 70–99)
Potassium: 4 mEq/L (ref 3.5–5.1)
Sodium: 137 mEq/L (ref 135–145)

## 2020-02-04 NOTE — Assessment & Plan Note (Signed)
Clinically improving. Cont w/Xarelto at least for 6 month D dimer

## 2020-02-04 NOTE — Assessment & Plan Note (Signed)
On Rx 

## 2020-02-04 NOTE — Progress Notes (Signed)
Subjective:  Patient ID: Johnny [name], male    DOB: September 23, 1945  Age: 74 y.o. MRN: [id number]  CC: Acute Visit (LT leg swollen )   HPI Johnny [name] presents for DVT 12/17/19, LLE swelling and pain - better  Outpatient Medications Prior to Visit  Medication Sig Dispense Refill  . acarbose (PRECOSE) 25 MG tablet Take 1 tablet (25 mg total) by mouth 3 (three) times daily with meals. 270 tablet 3  . albuterol (VENTOLIN HFA) 108 (90 Base) MCG/ACT inhaler INHALE 2 PUFFS INTO THE LUNGS EVERY 4 HOURS AS NEEDED FOR WHEEZING OR SHORTNESS OF BREATH 18 g 5  . Alcohol Swabs (B-D SINGLE USE SWABS REGULAR) PADS 1 each by Does not apply route 2 (two) times daily as needed. Dx: 250.00 100 each 3  . allopurinol (ZYLOPRIM) 100 MG tablet Take 1 tablet (100 mg total) by mouth daily. 90 tablet 3  . aspirin EC 81 MG tablet Take 81 mg by mouth daily.    . Blood Glucose Calibration (ACCU-CHEK AVIVA) SOLN Use as directed as needed. 1 each 2  . bromocriptine (PARLODEL) 2.5 MG tablet Take 1 tablet by mouth once daily 90 tablet 0  . cholecalciferol (VITAMIN D) 1000 UNITS tablet Take 1,000 Units by mouth every morning.    . colchicine 0.6 MG tablet Take 1 tablet (0.6 mg total) by mouth daily. 90 tablet 3  . diclofenac (VOLTAREN) 75 MG EC tablet TAKE 1 TABLET BY MOUTH  TWICE DAILY AS NEEDED FOR  MODERATE PAIN 180 tablet 3  . finasteride (PROSCAR) 5 MG tablet TAKE 1 TABLET BY MOUTH IN  THE MORNING 90 tablet 3  . fluticasone furoate-vilanterol (BREO ELLIPTA) 100-25 MCG/INH AEPB Inhale 1 puff into the lungs daily. 1 each 5  . irbesartan-hydrochlorothiazide (AVALIDE) 300-12.5 MG tablet TAKE 1 TABLET BY MOUTH  DAILY 90 tablet 3  . Lancet Devices (ACCU-CHEK SOFTCLIX) lancets Use twice daily as instructed. Dx: 250.00 1 each 3  . Lancets (ONETOUCH DELICA PLUS [id number]) MISC USE TWICE DAILY AS DIRECTED 200 each 3  . lovastatin (MEVACOR) 40 MG tablet TAKE 1 TABLET BY MOUTH IN  THE MORNING 90 tablet 3  . metFORMIN  (GLUCOPHAGE) 500 MG tablet Take 1 tablet (500 mg total) by mouth daily with breakfast. 90 tablet 3  . Multiple Vitamins-Minerals (MULTI-VITAMIN GUMMIES PO)     . nitroGLYCERIN (NITROSTAT) 0.4 MG SL tablet Place 1 tablet (0.4 mg total) under the tongue every 5 (five) minutes as needed for chest pain. 25 tablet 3  . ONETOUCH ULTRA test strip USE TWICE DAILY AS  INSTRUCTED 200 strip 3  . pantoprazole (PROTONIX) 40 MG tablet TAKE 1 TABLET BY MOUTH IN  THE MORNING 90 tablet 3  . repaglinide (PRANDIN) 1 MG tablet Take 1 tablet (1 mg total) by mouth 3 (three) times daily before meals. 270 tablet 3  . rivaroxaban (XARELTO) 20 MG TABS tablet Take 1 tablet (20 mg total) by mouth daily. 30 tablet 5  . tamsulosin (FLOMAX) 0.4 MG CAPS capsule TAKE 1 CAPSULE BY MOUTH  DAILY 90 capsule 3   No facility-administered medications prior to visit.    ROS: Review of Systems  Constitutional: Negative for appetite change, fatigue and unexpected weight change.  HENT: Negative for congestion, nosebleeds, sneezing, sore throat and trouble swallowing.   Eyes: Negative for itching and visual disturbance.  Respiratory: Negative for cough.   Cardiovascular: Positive for leg swelling. Negative for chest pain and palpitations.  Gastrointestinal: Negative for abdominal distention, blood  in stool, diarrhea and nausea.  Genitourinary: Negative for frequency and hematuria.  Musculoskeletal: Negative for back pain, gait problem, joint swelling and neck pain.  Skin: Negative for rash.  Neurological: Negative for dizziness, tremors, speech difficulty and weakness.  Psychiatric/Behavioral: Negative for agitation, dysphoric mood and sleep disturbance. The patient is not nervous/anxious.     Objective:  BP (!) 116/50   Pulse 68   Temp 98.7 F (37.1 C) (Oral)   Ht 5\' 11"  (1.803 m)   Wt 230 lb 12.8 oz (104.7 kg)   SpO2 99%   BMI 32.19 kg/m   BP Readings from Last 3 Encounters:  02/04/20 (!) 116/50  01/17/20 130/88    01/02/20 112/62    Wt Readings from Last 3 Encounters:  02/04/20 230 lb 12.8 oz (104.7 kg)  01/17/20 228 lb 12.8 oz (103.8 kg)  01/02/20 225 lb 9.6 oz (102.3 kg)    Physical Exam Constitutional:      General: He is not in acute distress.    Appearance: He is well-developed.     Comments: NAD  Eyes:     Conjunctiva/sclera: Conjunctivae normal.     Pupils: Pupils are equal, round, and reactive to light.  Neck:     Thyroid: No thyromegaly.     Vascular: No JVD.  Cardiovascular:     Rate and Rhythm: Normal rate and regular rhythm.     Heart sounds: Normal heart sounds. No murmur heard.  No friction rub. No gallop.   Pulmonary:     Effort: Pulmonary effort is normal. No respiratory distress.     Breath sounds: Normal breath sounds. No wheezing or rales.  Chest:     Chest Ullmer: No tenderness.  Abdominal:     General: Bowel sounds are normal. There is no distension.     Palpations: Abdomen is soft. There is no mass.     Tenderness: There is no abdominal tenderness. There is no guarding or rebound.  Musculoskeletal:        General: No swelling or tenderness. Normal range of motion.     Cervical back: Normal range of motion.  Lymphadenopathy:     Cervical: No cervical adenopathy.  Skin:    General: Skin is warm and dry.     Findings: No rash.  Neurological:     Mental Status: He is alert and oriented to person, place, and time.     Cranial Nerves: No cranial nerve deficit.     Motor: No abnormal muscle tone.     Coordination: Coordination normal.     Gait: Gait normal.     Deep Tendon Reflexes: Reflexes are normal and symmetric.  Psychiatric:        Behavior: Behavior normal.        Thought Content: Thought content normal.        Judgment: Judgment normal.      A total time of >25 minutes was spent preparing to see the patient, reviewing tests, x-rays, operative reports and outside records.  Also, obtaining history and performing comprehensive physical exam.   Additionally, counseling the patient regarding the above listed issues.   Finally, documenting clinical information in the health records, coordination of care, educating the patient re DVT. It is a complex case.  Lab Results  Component Value Date   WBC 7.1 12/16/2019   HGB 12.4 (L) 12/16/2019   HCT 36.5 (L) 12/16/2019   PLT 230.0 12/16/2019   GLUCOSE 143 (H) 12/16/2019   CHOL 167 09/09/2016  TRIG 371.0 (H) 09/09/2016   HDL 31.40 (L) 09/09/2016   LDLDIRECT 73.0 09/09/2016   LDLCALC 74 11/29/2013   ALT 14 05/07/2018   AST 10 05/07/2018   NA 142 12/16/2019   K 4.1 12/16/2019   CL 105 12/16/2019   CREATININE 1.26 12/16/2019   BUN 12 12/16/2019   CO2 29 12/16/2019   TSH 3.59 10/14/2010   PSA 1.0 11/14/2019   INR 1.15 01/21/2013   HGBA1C 7.7 (A) 01/17/2020   MICROALBUR <0.7 09/09/2016    VAS [place] LOWER EXTREMITY VENOUS (DVT)  Result Date: 12/17/2019  Lower Venous DVTStudy Indications: Right calf pain/ tightness.  Comparison Study: none Performing Technologist: June Leap RDMS, RVT  Examination Guidelines: A complete evaluation includes B-mode imaging, spectral Doppler, color Doppler, and power Doppler as needed of all accessible portions of each vessel. Bilateral testing is considered an integral part of a complete examination. Limited examinations for reoccurring indications may be performed as noted. The reflux portion of the exam is performed with the patient in reverse Trendelenburg.  +-----+---------------+---------+-----------+----------+--------------+ RIGHTCompressibilityPhasicitySpontaneityPropertiesThrombus Aging +-----+---------------+---------+-----------+----------+--------------+ CFV  Full           Yes      Yes                                 +-----+---------------+---------+-----------+----------+--------------+   +---------+---------------+---------+-----------+----------+--------------+ LEFT     CompressibilityPhasicitySpontaneityPropertiesThrombus  Aging +---------+---------------+---------+-----------+----------+--------------+ CFV      Full           Yes      Yes                                 +---------+---------------+---------+-----------+----------+--------------+ SFJ      Full                                                        +---------+---------------+---------+-----------+----------+--------------+ FV Prox  Full                                                        +---------+---------------+---------+-----------+----------+--------------+ FV Mid   Partial        Yes      Yes                  Acute          +---------+---------------+---------+-----------+----------+--------------+ FV DistalNone           No       No                   Acute          +---------+---------------+---------+-----------+----------+--------------+ PFV      Full                                                        +---------+---------------+---------+-----------+----------+--------------+ POP      None  No       No                   Acute          +---------+---------------+---------+-----------+----------+--------------+ PTV      None                                         Acute          +---------+---------------+---------+-----------+----------+--------------+ PERO     None                                         Acute          +---------+---------------+---------+-----------+----------+--------------+ Gastroc  None                                         Acute          +---------+---------------+---------+-----------+----------+--------------+ GSV      Full                                                        +---------+---------------+---------+-----------+----------+--------------+    Findings reported to [facility] @ Dr. Ronnald Ramp' office at 3:18 pm.  Summary: RIGHT: - No evidence of common femoral vein obstruction.  LEFT: - Findings consistent with acute deep vein thrombosis  involving the left femoral vein, left popliteal vein, left posterior tibial veins, left peroneal veins, and left gastrocnemius veins.  *See table(s) above for measurements and observations. Electronically signed by Monica Martinez MD on 12/17/2019 at 4:40:56 PM.    Final     Assessment & Plan:    Walker Kehr, MD

## 2020-02-04 NOTE — Patient Instructions (Signed)
Continue with Xarelto at least for  5 more month

## 2020-02-05 LAB — D-DIMER, QUANTITATIVE: D-Dimer, Quant: 1.57 mcg/mL FEU — ABNORMAL HIGH (ref <0.50)

## 2020-02-18 ENCOUNTER — Ambulatory Visit (INDEPENDENT_AMBULATORY_CARE_PROVIDER_SITE_OTHER): Payer: Medicare Other

## 2020-02-18 ENCOUNTER — Encounter: Payer: Self-pay | Admitting: Internal Medicine

## 2020-02-18 ENCOUNTER — Other Ambulatory Visit: Payer: Self-pay

## 2020-02-18 ENCOUNTER — Ambulatory Visit (INDEPENDENT_AMBULATORY_CARE_PROVIDER_SITE_OTHER): Payer: Medicare Other | Admitting: Internal Medicine

## 2020-02-18 VITALS — BP 140/80 | HR 55 | Temp 98.2°F | Ht 71.0 in | Wt 230.2 lb

## 2020-02-18 DIAGNOSIS — I10 Essential (primary) hypertension: Secondary | ICD-10-CM | POA: Diagnosis not present

## 2020-02-18 DIAGNOSIS — Z0000 Encounter for general adult medical examination without abnormal findings: Secondary | ICD-10-CM | POA: Diagnosis not present

## 2020-02-18 DIAGNOSIS — I824Y2 Acute embolism and thrombosis of unspecified deep veins of left proximal lower extremity: Secondary | ICD-10-CM

## 2020-02-18 DIAGNOSIS — N320 Bladder-neck obstruction: Secondary | ICD-10-CM

## 2020-02-18 DIAGNOSIS — E1159 Type 2 diabetes mellitus with other circulatory complications: Secondary | ICD-10-CM | POA: Diagnosis not present

## 2020-02-18 NOTE — Assessment & Plan Note (Addendum)
Xarelto for x 6 mo or longer D dimer has improved OK to get a COVID 19 booster

## 2020-02-18 NOTE — Assessment & Plan Note (Signed)
Irbesartan HCT

## 2020-02-18 NOTE — Assessment & Plan Note (Addendum)
Urol referral - Sept/ appt made

## 2020-02-18 NOTE — Progress Notes (Signed)
Subjective:  Patient ID: Johnny [name], male    DOB: Jul 08, 1945  Age: 74 y.o. MRN: [id number]  CC: Follow-up (3 MONTH F/U)   HPI Mylo Red Sessums presents for DVT, DM, HTN f/u  Outpatient Medications Prior to Visit  Medication Sig Dispense Refill  . acarbose (PRECOSE) 25 MG tablet Take 1 tablet (25 mg total) by mouth 3 (three) times daily with meals. 270 tablet 3  . albuterol (VENTOLIN HFA) 108 (90 Base) MCG/ACT inhaler INHALE 2 PUFFS INTO THE LUNGS EVERY 4 HOURS AS NEEDED FOR WHEEZING OR SHORTNESS OF BREATH 18 g 5  . Alcohol Swabs (B-D SINGLE USE SWABS REGULAR) PADS 1 each by Does not apply route 2 (two) times daily as needed. Dx: 250.00 100 each 3  . allopurinol (ZYLOPRIM) 100 MG tablet Take 1 tablet (100 mg total) by mouth daily. 90 tablet 3  . aspirin EC 81 MG tablet Take 81 mg by mouth daily.    . Blood Glucose Calibration (ACCU-CHEK AVIVA) SOLN Use as directed as needed. 1 each 2  . bromocriptine (PARLODEL) 2.5 MG tablet Take 1 tablet by mouth once daily 90 tablet 0  . cholecalciferol (VITAMIN D) 1000 UNITS tablet Take 1,000 Units by mouth every morning.    . colchicine 0.6 MG tablet Take 1 tablet (0.6 mg total) by mouth daily. 90 tablet 3  . diclofenac (VOLTAREN) 75 MG EC tablet TAKE 1 TABLET BY MOUTH  TWICE DAILY AS NEEDED FOR  MODERATE PAIN 180 tablet 3  . finasteride (PROSCAR) 5 MG tablet TAKE 1 TABLET BY MOUTH IN  THE MORNING 90 tablet 3  . fluticasone furoate-vilanterol (BREO ELLIPTA) 100-25 MCG/INH AEPB Inhale 1 puff into the lungs daily. 1 each 5  . irbesartan-hydrochlorothiazide (AVALIDE) 300-12.5 MG tablet TAKE 1 TABLET BY MOUTH  DAILY 90 tablet 3  . Lancet Devices (ACCU-CHEK SOFTCLIX) lancets Use twice daily as instructed. Dx: 250.00 1 each 3  . Lancets (ONETOUCH DELICA PLUS [id number]) MISC USE TWICE DAILY AS DIRECTED 200 each 3  . lovastatin (MEVACOR) 40 MG tablet TAKE 1 TABLET BY MOUTH IN  THE MORNING 90 tablet 3  . metFORMIN (GLUCOPHAGE) 500 MG tablet Take 1 tablet  (500 mg total) by mouth daily with breakfast. 90 tablet 3  . Multiple Vitamins-Minerals (MULTI-VITAMIN GUMMIES PO)     . nitroGLYCERIN (NITROSTAT) 0.4 MG SL tablet Place 1 tablet (0.4 mg total) under the tongue every 5 (five) minutes as needed for chest pain. 25 tablet 3  . ONETOUCH ULTRA test strip USE TWICE DAILY AS  INSTRUCTED 200 strip 3  . pantoprazole (PROTONIX) 40 MG tablet TAKE 1 TABLET BY MOUTH IN  THE MORNING 90 tablet 3  . repaglinide (PRANDIN) 1 MG tablet Take 1 tablet (1 mg total) by mouth 3 (three) times daily before meals. 270 tablet 3  . rivaroxaban (XARELTO) 20 MG TABS tablet Take 1 tablet (20 mg total) by mouth daily. 30 tablet 5  . tamsulosin (FLOMAX) 0.4 MG CAPS capsule TAKE 1 CAPSULE BY MOUTH  DAILY 90 capsule 3   No facility-administered medications prior to visit.    ROS: Review of Systems  Constitutional: Negative for appetite change, fatigue and unexpected weight change.  HENT: Negative for congestion, nosebleeds, sneezing, sore throat and trouble swallowing.   Eyes: Negative for itching and visual disturbance.  Respiratory: Negative for cough.   Cardiovascular: Positive for leg swelling. Negative for chest pain and palpitations.  Gastrointestinal: Negative for abdominal distention, blood in stool, diarrhea and nausea.  Genitourinary: Negative for frequency and hematuria.  Musculoskeletal: Negative for back pain, gait problem, joint swelling and neck pain.  Skin: Negative for rash.  Neurological: Negative for dizziness, tremors, speech difficulty and weakness.  Psychiatric/Behavioral: Negative for agitation, dysphoric mood and sleep disturbance. The patient is not nervous/anxious.     Objective:  BP 140/80   Pulse (!) 55   Temp 98.2 F (36.8 C) (Oral)   Wt 230 lb 4.8 oz (104.5 kg)   SpO2 98%   BMI 32.12 kg/m   BP Readings from Last 3 Encounters:  02/18/20 140/80  02/18/20 140/80  02/04/20 (!) 116/50    Wt Readings from Last 3 Encounters:  02/18/20  230 lb 4.8 oz (104.5 kg)  02/18/20 230 lb 3.2 oz (104.4 kg)  02/04/20 230 lb 12.8 oz (104.7 kg)    Physical Exam Constitutional:      General: He is not in acute distress.    Appearance: He is well-developed.     Comments: NAD  HENT:     Mouth/Throat:     Mouth: Oropharynx is clear and moist.  Eyes:     Conjunctiva/sclera: Conjunctivae normal.     Pupils: Pupils are equal, round, and reactive to light.  Neck:     Thyroid: No thyromegaly.     Vascular: No JVD.  Cardiovascular:     Rate and Rhythm: Normal rate and regular rhythm.     Pulses: Intact distal pulses.     Heart sounds: Normal heart sounds. No murmur heard. No friction rub. No gallop.   Pulmonary:     Effort: Pulmonary effort is normal. No respiratory distress.     Breath sounds: Normal breath sounds. No wheezing or rales.  Chest:     Chest [place]: No tenderness.  Abdominal:     General: Bowel sounds are normal. There is no distension.     Palpations: Abdomen is soft. There is no mass.     Tenderness: There is no abdominal tenderness. There is no guarding or rebound.  Musculoskeletal:        General: No tenderness. Normal range of motion.     Cervical back: Normal range of motion.     Left lower leg: Edema present.  Lymphadenopathy:     Cervical: No cervical adenopathy.  Skin:    General: Skin is warm and dry.     Findings: No rash.  Neurological:     Mental Status: He is alert and oriented to person, place, and time.     Cranial Nerves: No cranial nerve deficit.     Motor: No abnormal muscle tone.     Coordination: He displays a negative Romberg sign. Coordination normal.     Gait: Gait normal.     Deep Tendon Reflexes: Reflexes are normal and symmetric.  Psychiatric:        Mood and Affect: Mood and affect normal.        Behavior: Behavior normal.        Thought Content: Thought content normal.        Judgment: Judgment normal.    L ankle w/trace edema, NT   Lab Results  Component Value Date   WBC  7.1 12/16/2019   HGB 12.4 (L) 12/16/2019   HCT 36.5 (L) 12/16/2019   PLT 230.0 12/16/2019   GLUCOSE 219 (H) 02/04/2020   CHOL 167 09/09/2016   TRIG 371.0 (H) 09/09/2016   HDL 31.40 (L) 09/09/2016   LDLDIRECT 73.0 09/09/2016   LDLCALC 74 11/29/2013  ALT 14 05/07/2018   AST 10 05/07/2018   NA 137 02/04/2020   K 4.0 02/04/2020   CL 101 02/04/2020   CREATININE 1.34 02/04/2020   BUN 16 02/04/2020   CO2 30 02/04/2020   TSH 3.59 10/14/2010   PSA 1.0 11/14/2019   INR 1.15 01/21/2013   HGBA1C 7.7 (A) 01/17/2020   MICROALBUR <0.7 09/09/2016    VAS [place] LOWER EXTREMITY VENOUS (DVT)  Result Date: 12/17/2019  Lower Venous DVTStudy Indications: Right calf pain/ tightness.  Comparison Study: none Performing Technologist: June Leap RDMS, RVT  Examination Guidelines: A complete evaluation includes B-mode imaging, spectral Doppler, color Doppler, and power Doppler as needed of all accessible portions of each vessel. Bilateral testing is considered an integral part of a complete examination. Limited examinations for reoccurring indications may be performed as noted. The reflux portion of the exam is performed with the patient in reverse Trendelenburg.  +-----+---------------+---------+-----------+----------+--------------+ RIGHTCompressibilityPhasicitySpontaneityPropertiesThrombus Aging +-----+---------------+---------+-----------+----------+--------------+ CFV  Full           Yes      Yes                                 +-----+---------------+---------+-----------+----------+--------------+   +---------+---------------+---------+-----------+----------+--------------+ LEFT     CompressibilityPhasicitySpontaneityPropertiesThrombus Aging +---------+---------------+---------+-----------+----------+--------------+ CFV      Full           Yes      Yes                                 +---------+---------------+---------+-----------+----------+--------------+ SFJ      Full                                                         +---------+---------------+---------+-----------+----------+--------------+ FV Prox  Full                                                        +---------+---------------+---------+-----------+----------+--------------+ FV Mid   Partial        Yes      Yes                  Acute          +---------+---------------+---------+-----------+----------+--------------+ FV DistalNone           No       No                   Acute          +---------+---------------+---------+-----------+----------+--------------+ PFV      Full                                                        +---------+---------------+---------+-----------+----------+--------------+ POP      None           No       No  Acute          +---------+---------------+---------+-----------+----------+--------------+ PTV      None                                         Acute          +---------+---------------+---------+-----------+----------+--------------+ PERO     None                                         Acute          +---------+---------------+---------+-----------+----------+--------------+ Gastroc  None                                         Acute          +---------+---------------+---------+-----------+----------+--------------+ GSV      Full                                                        +---------+---------------+---------+-----------+----------+--------------+    Findings reported to [facility] @ Dr. Ronnald Ramp' office at 3:18 pm.  Summary: RIGHT: - No evidence of common femoral vein obstruction.  LEFT: - Findings consistent with acute deep vein thrombosis involving the left femoral vein, left popliteal vein, left posterior tibial veins, left peroneal veins, and left gastrocnemius veins.  *See table(s) above for measurements and observations. Electronically signed by Monica Martinez MD on 12/17/2019 at 4:40:56 PM.     Final     Assessment & Plan:    Walker Kehr, MD

## 2020-02-18 NOTE — Progress Notes (Addendum)
Subjective:   Johnny [name] is a 74 y.o. male who presents for Medicare Annual/Subsequent preventive examination.  Review of Systems    No ROS. Medicare Wellness Visit. Additional risk factors are reflected in social history. Cardiac Risk Factors include: advanced age (>38men, >11 women);diabetes mellitus;dyslipidemia;hypertension;male gender;obesity (BMI >30kg/m2);family history of premature cardiovascular disease     Objective:    Today's Vitals   02/18/20 0915  BP: 140/80  Pulse: (!) 55  Temp: 98.2 F (36.8 C)  SpO2: 98%  Weight: 230 lb 3.2 oz (104.4 kg)  Height: 5\' 11"  (1.803 m)  PainSc: 0-No pain   Body mass index is 32.11 kg/m.  Advanced Directives 02/18/2020 12/17/2019 10/13/2016 12/25/2014 05/27/2013 01/30/2013 01/21/2013  Does Patient Have a Medical Advance Directive? No No No No Patient does not have advance directive;Patient would not like information Patient does not have advance directive Patient does not have advance directive  Would patient like information on creating a medical advance directive? No - Patient declined No - Patient declined No - Patient declined - - - -  Pre-existing out of facility DNR order (yellow form or pink MOST form) - - - - - No No    Current Medications (verified) Outpatient Encounter Medications as of 02/18/2020  Medication Sig   acarbose (PRECOSE) 25 MG tablet Take 1 tablet (25 mg total) by mouth 3 (three) times daily with meals.   albuterol (VENTOLIN HFA) 108 (90 Base) MCG/ACT inhaler INHALE 2 PUFFS INTO THE LUNGS EVERY 4 HOURS AS NEEDED FOR WHEEZING OR SHORTNESS OF BREATH   Alcohol Swabs (B-D SINGLE USE SWABS REGULAR) PADS 1 each by Does not apply route 2 (two) times daily as needed. Dx: 250.00   allopurinol (ZYLOPRIM) 100 MG tablet Take 1 tablet (100 mg total) by mouth daily.   aspirin EC 81 MG tablet Take 81 mg by mouth daily.   Blood Glucose Calibration (ACCU-CHEK AVIVA) SOLN Use as directed as needed.   bromocriptine  (PARLODEL) 2.5 MG tablet Take 1 tablet by mouth once daily   cholecalciferol (VITAMIN D) 1000 UNITS tablet Take 1,000 Units by mouth every morning.   colchicine 0.6 MG tablet Take 1 tablet (0.6 mg total) by mouth daily.   diclofenac (VOLTAREN) 75 MG EC tablet TAKE 1 TABLET BY MOUTH  TWICE DAILY AS NEEDED FOR  MODERATE PAIN   finasteride (PROSCAR) 5 MG tablet TAKE 1 TABLET BY MOUTH IN  THE MORNING   fluticasone furoate-vilanterol (BREO ELLIPTA) 100-25 MCG/INH AEPB Inhale 1 puff into the lungs daily.   irbesartan-hydrochlorothiazide (AVALIDE) 300-12.5 MG tablet TAKE 1 TABLET BY MOUTH  DAILY   Lancet Devices (ACCU-CHEK SOFTCLIX) lancets Use twice daily as instructed. Dx: 250.00   Lancets (ONETOUCH DELICA PLUS [id number]) MISC USE TWICE DAILY AS DIRECTED   lovastatin (MEVACOR) 40 MG tablet TAKE 1 TABLET BY MOUTH IN  THE MORNING   metFORMIN (GLUCOPHAGE) 500 MG tablet Take 1 tablet (500 mg total) by mouth daily with breakfast.   Multiple Vitamins-Minerals (MULTI-VITAMIN GUMMIES PO)    nitroGLYCERIN (NITROSTAT) 0.4 MG SL tablet Place 1 tablet (0.4 mg total) under the tongue every 5 (five) minutes as needed for chest pain.   ONETOUCH ULTRA test strip USE TWICE DAILY AS  INSTRUCTED   pantoprazole (PROTONIX) 40 MG tablet TAKE 1 TABLET BY MOUTH IN  THE MORNING   repaglinide (PRANDIN) 1 MG tablet Take 1 tablet (1 mg total) by mouth 3 (three) times daily before meals.   rivaroxaban (XARELTO) 20 MG TABS tablet  Take 1 tablet (20 mg total) by mouth daily.   tamsulosin (FLOMAX) 0.4 MG CAPS capsule TAKE 1 CAPSULE BY MOUTH  DAILY   No facility-administered encounter medications on file as of 02/18/2020.    Allergies (verified) Enalapril and Hydrocodone-acetaminophen   History: Past Medical History:  Diagnosis Date   Arthritis    At risk for sleep apnea    STOP-BANG=  6   SENT TO PCP 05-21-2013   Benign positional vertigo    Benign prostatic hypertrophy    Blood transfusion without reported diagnosis  2000   CAD (coronary artery disease)    CARDIOLOGIST--  DR Cristopher Peru   CHF (congestive heart failure) (HCC)    Chronic ischemic heart disease, unspecified    ED (erectile dysfunction) of organic origin    GERD (gastroesophageal reflux disease)    History of CHF (congestive heart failure)    systolic   HTN (hypertension)    Hyperlipidemia    Type 2 diabetes mellitus ([place])    Wears glasses    Past Surgical History:  Procedure Laterality Date   CARDIAC CATHETERIZATION  03-03-2000  dr Carleene Overlie taylor   normal lvsf/  two-vessel cad significant complex stenosis at distal left main   CARDIOVASCULAR STRESS TEST  08-09-2010  DR Carleene Overlie TAYLOR   normal lexiscan nuclear study/  no ischemia/  normal lvf/  ef 54%   CATARACT EXTRACTION W/ INTRAOCULAR LENS IMPLANT Right 2011   COLONOSCOPY  12-30-2009   CORONARY ARTERY BYPASS GRAFT  03-06-2000   DR GERHARDT   third vessel   PENILE PROSTHESIS IMPLANT  02-08-2010   COLOPLAST 3-PIECE INFLATABLE   PENILE PROSTHESIS IMPLANT N/A 05/27/2013   Procedure: CYSTO REMOVAL OF PENILE PROSTHESIS;  Surgeon: Claybon Jabs, MD;  [facility];  Service: Urology;  Laterality: N/A;   POLYPECTOMY  12-30-2009   +TA   TOTAL HIP ARTHROPLASTY  01/24/2011   Procedure: TOTAL HIP ARTHROPLASTY;  Surgeon: Dione Plover [name];  Location: [facility];  Service: Orthopedics;  Laterality: Left;   TOTAL HIP ARTHROPLASTY Right 01/30/2013   Procedure: RIGHT TOTAL HIP ARTHROPLASTY;  Surgeon: Gearlean Alf, MD;  Location: [facility];  Service: Orthopedics;  Laterality: Right;   Family History  Problem Relation Age of Onset   Hypertension Mother    Diabetes Father    Cancer - Other Brother        cancer all over   Coronary artery disease Other        1st degree male relative   Hypertension Other    Cancer Sister 51       ovarian ca   Colon cancer Neg Hx    Social History   Socioeconomic History   Marital status: Married    Spouse name: Not on file   Number of  children: Not on file   Years of education: Not on file   Highest education level: Not on file  Occupational History   Occupation: Engineer, building services: RETIRED  Tobacco Use   Smoking status: Former Smoker    Types: Cigarettes    Quit date: 05/22/1970    Years since quitting: 49.7   Smokeless tobacco: Never Used  Vaping Use   Vaping Use: Never used  Substance and Sexual Activity   Alcohol use: No    Alcohol/week: 0.0 standard drinks   Drug use: No   Sexual activity: Not on file  Other Topics Concern   Not on file  Social History Narrative   Regular  Exercise-No         Social Determinants of Radio broadcast assistant Strain: Low Risk    Difficulty of Paying Living Expenses: Not hard at all  Food Insecurity: No Food Insecurity   Worried About Charity fundraiser in the Last Year: Never true   Arboriculturist in the Last Year: Never true  Transportation Needs: No Transportation Needs   Lack of Transportation (Medical): No   Lack of Transportation (Non-Medical): No  Physical Activity: Sufficiently Active   Days of Exercise per Week: 5 days   Minutes of Exercise per Session: 30 min  Stress: No Stress Concern Present   Feeling of Stress : Not at all  Social Connections: Socially Integrated   Frequency of Communication with Friends and Family: More than three times a week   Frequency of Social Gatherings with Friends and Family: More than three times a week   Attends Religious Services: More than 4 times per year   Active Member of [employer] or Organizations: Yes   Attends Music therapist: More than 4 times per year   Marital Status: Married    Tobacco Counseling Counseling given: Not Answered   Clinical Intake:  Pre-visit preparation completed: Yes  Pain : No/denies pain Pain Score: 0-No pain     BMI - recorded: 32.11 Nutritional Status: BMI > 30  Obese Nutritional Risks: None Diabetes: Yes CBG done?: No Did pt. bring in CBG monitor from home?:  No  How often do you need to have someone help you when you read instructions, pamphlets, or other written materials from your doctor or pharmacy?: 1 - Never What is the last grade level you completed in school?: HS[employer]; [employer]  Diabetic? yes  Interpreter Needed?: No  Information entered by :: Lisette Abu, LPN   Activities of Daily Living In your present state of health, do you have any difficulty performing the following activities: 02/18/2020  Hearing? N  Vision? N  Difficulty concentrating or making decisions? N  Walking or climbing stairs? N  Dressing or bathing? N  Doing errands, shopping? N  Preparing Food and eating ? N  Using the Toilet? N  In the past six months, have you accidently leaked urine? N  Do you have problems with loss of bowel control? N  Managing your Medications? N  Managing your Finances? N  Housekeeping or managing your Housekeeping? N  Some recent data might be hidden    Patient Care Team: Plotnikov, Evie Lacks, MD as PCP - General Kathie Rhodes, MD (Inactive) (Urology) Gaynelle Arabian, MD (Orthopedic Surgery) Evans Lance, MD (Cardiology) Iran Planas, MD as Consulting Physician (Orthopedic Surgery) Renato Shin, MD as Consulting Physician (Endocrinology)  Indicate any recent Medical Services you may have received from other than [facility] providers in the past year (date may be approximate).     Assessment:   This is a routine wellness examination for [place].  Hearing/Vision screen No exam data present  Dietary issues and exercise activities discussed: Current Exercise Habits: Home exercise routine;Structured exercise class, Type of exercise: walking, Time (Minutes): 30, Frequency (Times/Week): 5, Weekly Exercise (Minutes/Week): 150, Intensity: Moderate, Exercise limited by: orthopedic condition(s)  Goals   None    Depression Screen PHQ 2/9 Scores 02/18/2020 11/14/2019 01/25/2018 10/13/2016 02/08/2016 12/02/2014  PHQ - 2 Score 0 0  0 0 0 0    Fall Risk Fall Risk  02/18/2020 11/14/2019 01/25/2018 10/13/2016 02/08/2016  Falls in the past year? 0 0 0  No No  Number falls in past yr: 0 0 - - -  Injury with Fall? 0 0 - - -  Risk for fall due to : No Fall Risks - - - -  Follow up Falls evaluation completed - Falls evaluation completed - -    FALL RISK PREVENTION PERTAINING TO THE HOME:  Any stairs in or around the home? Yes  If so, are there any without handrails? No  Home free of loose throw rugs in walkways, pet beds, electrical cords, etc? Yes  Adequate lighting in your home to reduce risk of falls? Yes   ASSISTIVE DEVICES UTILIZED TO PREVENT FALLS:  Life alert? No  Use of a cane, walker or w/c? No  Grab bars in the bathroom? Yes  Shower chair or bench in shower? Yes  Elevated toilet seat or a handicapped toilet? Yes   TIMED UP AND GO:  Was the test performed? No .  Length of time to ambulate 10 feet: 0 sec.   Gait steady and fast without use of assistive device  Cognitive Function: Normal cognitive status assessed by direct observation by this Nurse Health Advisor. No abnormalities found.          Immunizations Immunization History  Administered Date(s) Administered   Fluad Quad(high Dose 65+) 01/02/2019, 12/25/2019   Influenza Split 12/16/2011   Influenza Whole 03/02/2007, 12/23/2008, 10/30/2009   Influenza, High Dose Seasonal PF 01/24/2013, 02/08/2016, 12/16/2016, 01/25/2018   Influenza,inj,Quad PF,6+ Mos 11/29/2013, 12/02/2014   PFIZER SARS-COV-2 Vaccination 04/10/2019, 05/21/2019   Pneumococcal Conjugate-13 06/03/2013   Pneumococcal Polysaccharide-23 10/30/2009, 02/08/2016   Td 03/20/2012    TDAP status: Up to date  Flu Vaccine status: Up to date  Pneumococcal vaccine status: Up to date  Covid-19 vaccine status: Completed vaccines  Qualifies for Shingles Vaccine? Yes   Zostavax completed No   Shingrix Completed?: No.    Education has been provided regarding the importance of this  vaccine. Patient has been advised to call insurance company to determine out of pocket expense if they have not yet received this vaccine. Advised may also receive vaccine at local pharmacy or Health Dept. Verbalized acceptance and understanding.  Screening Tests Health Maintenance  Topic Date Due   Hepatitis C Screening  Never done   COVID-19 Vaccine (3 - Booster for Pfizer series) 11/21/2019   COLONOSCOPY  01/08/2020   OPHTHALMOLOGY EXAM  02/22/2020   HEMOGLOBIN A1C  07/16/2020   FOOT EXAM  12/15/2020   TETANUS/TDAP  03/20/2022   INFLUENZA VACCINE  Completed   PNA vac Low Risk Adult  Completed    Health Maintenance  Health Maintenance Due  Topic Date Due   Hepatitis C Screening  Never done   COVID-19 Vaccine (3 - Booster for Pfizer series) 11/21/2019   COLONOSCOPY  01/08/2020    Colorectal cancer screening: Type of screening: Colonoscopy. Completed 01/08/2015. Repeat every 5 years  Lung Cancer Screening: (Low Dose CT Chest recommended if Age 34-80 years, 30 pack-year currently smoking OR have quit w/in 15years.) does not qualify.   Lung Cancer Screening Referral: no  Additional Screening:  Hepatitis C Screening: does qualify; Completed no  Vision Screening: Recommended annual ophthalmology exams for early detection of glaucoma and other disorders of the eye. Is the patient up to date with their annual eye exam?  Yes  Who is the provider or what is the name of the office in which the patient attends annual eye exams? Calvert Cantor, MD. If pt is not established with a  provider, would they like to be referred to a provider to establish care? No .   Dental Screening: Recommended annual dental exams for proper oral hygiene  Community Resource Referral / Chronic Care Management: CRR required this visit?  No   CCM required this visit?  No      Plan:     I have personally reviewed and noted the following in the patient's chart:   Medical and social history Use of  alcohol, tobacco or illicit drugs  Current medications and supplements Functional ability and status Nutritional status Physical activity Advanced directives List of other physicians Hospitalizations, surgeries, and ER visits in previous 12 months Vitals Screenings to include cognitive, depression, and falls Referrals and appointments  In addition, I have reviewed and discussed with patient certain preventive protocols, quality metrics, and best practice recommendations. A written personalized care plan for preventive services as well as general preventive health recommendations were provided to patient.     Sheral Flow, LPN   [id number]   Nurse Notes: n/a  Medical screening examination/treatment/procedure(s) were performed by non-physician practitioner and as supervising physician I was immediately available for consultation/collaboration.  I agree with above. Lew Dawes, MD

## 2020-02-18 NOTE — Patient Instructions (Signed)
[name] , Thank you for taking time to come for your Medicare Wellness Visit. I appreciate your ongoing commitment to your health goals. Please review the following plan we discussed and let me know if I can assist you in the future.   Screening recommendations/referrals: Colonoscopy: 01/08/2015; due every 5 years Recommended yearly ophthalmology/optometry visit for glaucoma screening and checkup Recommended yearly dental visit for hygiene and checkup  Vaccinations: Influenza vaccine: 12/25/2019 Pneumococcal vaccine: up to date Tdap vaccine: 03/20/2012; due every 10 years Shingles vaccine: never done   Covid-19: up to date  Advanced directives: Advance directive discussed with you today. Even though you declined this today please call our office should you change your mind and we can give you the proper paperwork for you to fill out.  Conditions/risks identified: Yes; Reviewed health maintenance screenings with patient today and relevant education, vaccines, and/or referrals were provided. Please continue to do your personal lifestyle choices by: daily care of teeth and gums, regular physical activity (goal should be 5 days a week for 30 minutes), eat a healthy diet, avoid tobacco and drug use, limiting any alcohol intake, taking a low-dose aspirin (if not allergic or have been advised by your provider otherwise) and taking vitamins and minerals as recommended by your provider. Continue doing brain stimulating activities (puzzles, reading, adult coloring books, staying active) to keep memory sharp. Continue to eat heart healthy diet (full of fruits, vegetables, whole grains, lean protein, water--limit salt, fat, and sugar intake) and increase physical activity as tolerated.  Next appointment: Please schedule your next Medicare Wellness Visit with your Nurse Health Advisor in 1 year by calling [phone].  Preventive Care 75 Years and Older, Male Preventive care refers to lifestyle choices and  visits with your health care provider that can promote health and wellness. What does preventive care include?  A yearly physical exam. This is also called an annual well check.  Dental exams once or twice a year.  Routine eye exams. Ask your health care provider how often you should have your eyes checked.  Personal lifestyle choices, including:  Daily care of your teeth and gums.  Regular physical activity.  Eating a healthy diet.  Avoiding tobacco and drug use.  Limiting alcohol use.  Practicing safe sex.  Taking low doses of aspirin every day.  Taking vitamin and mineral supplements as recommended by your health care provider. What happens during an annual well check? The services and screenings done by your health care provider during your annual well check will depend on your age, overall health, lifestyle risk factors, and family history of disease. Counseling  Your health care provider may ask you questions about your:  Alcohol use.  Tobacco use.  Drug use.  Emotional well-being.  Home and relationship well-being.  Sexual activity.  Eating habits.  History of falls.  Memory and ability to understand (cognition).  Work and work Statistician. Screening  You may have the following tests or measurements:  Height, weight, and BMI.  Blood pressure.  Lipid and cholesterol levels. These may be checked every 5 years, or more frequently if you are over 75 years old.  Skin check.  Lung cancer screening. You may have this screening every year starting at age 75 if you have a 30-pack-year history of smoking and currently smoke or have quit within the past 15 years.  Fecal occult blood test (FOBT) of the stool. You may have this test every year starting at age 75.  Flexible sigmoidoscopy or colonoscopy.  You may have a sigmoidoscopy every 5 years or a colonoscopy every 10 years starting at age 75.  Prostate cancer screening. Recommendations will vary  depending on your family history and other risks.  Hepatitis C blood test.  Hepatitis B blood test.  Sexually transmitted disease (STD) testing.  Diabetes screening. This is done by checking your blood sugar (glucose) after you have not eaten for a while (fasting). You may have this done every 1-3 years.  Abdominal aortic aneurysm (AAA) screening. You may need this if you are a current or former smoker.  Osteoporosis. You may be screened starting at age 75 if you are at high risk. Talk with your health care provider about your test results, treatment options, and if necessary, the need for more tests. Vaccines  Your health care provider may recommend certain vaccines, such as:  Influenza vaccine. This is recommended every year.  Tetanus, diphtheria, and acellular pertussis (Tdap, Td) vaccine. You may need a Td booster every 10 years.  Zoster vaccine. You may need this after age 75.  Pneumococcal 13-valent conjugate (PCV13) vaccine. One dose is recommended after age 69.  Pneumococcal polysaccharide (PPSV23) vaccine. One dose is recommended after age 75. Talk to your health care provider about which screenings and vaccines you need and how often you need them. This information is not intended to replace advice given to you by your health care provider. Make sure you discuss any questions you have with your health care provider. Document Released: 03/20/2015 Document Revised: 11/11/2015 Document Reviewed: 12/23/2014 Elsevier Interactive Patient Education  2017 [place] Prevention in the Home Falls can cause injuries. They can happen to people of all ages. There are many things you can do to make your home safe and to help prevent falls. What can I do on the outside of my home?  Regularly fix the edges of walkways and driveways and fix any cracks.  Remove anything that might make you trip as you walk through a door, such as a raised step or threshold.  Trim any bushes  or trees on the path to your home.  Use bright outdoor lighting.  Clear any walking paths of anything that might make someone trip, such as rocks or tools.  Regularly check to see if handrails are loose or broken. Make sure that both sides of any steps have handrails.  Any raised decks and porches should have guardrails on the edges.  Have any leaves, snow, or ice cleared regularly.  Use sand or salt on walking paths during winter.  Clean up any spills in your garage right away. This includes oil or grease spills. What can I do in the bathroom?  Use night lights.  Install grab bars by the toilet and in the tub and shower. Do not use towel bars as grab bars.  Use non-skid mats or decals in the tub or shower.  If you need to sit down in the shower, use a plastic, non-slip stool.  Keep the floor dry. Clean up any water that spills on the floor as soon as it happens.  Remove soap buildup in the tub or shower regularly.  Attach bath mats securely with double-sided non-slip rug tape.  Do not have throw rugs and other things on the floor that can make you trip. What can I do in the bedroom?  Use night lights.  Make sure that you have a light by your bed that is easy to reach.  Do not use any sheets  or blankets that are too big for your bed. They should not hang down onto the floor.  Have a firm chair that has side arms. You can use this for support while you get dressed.  Do not have throw rugs and other things on the floor that can make you trip. What can I do in the kitchen?  Clean up any spills right away.  Avoid walking on wet floors.  Keep items that you use a lot in easy-to-reach places.  If you need to reach something above you, use a strong step stool that has a grab bar.  Keep electrical cords out of the way.  Do not use floor polish or wax that makes floors slippery. If you must use wax, use non-skid floor wax.  Do not have throw rugs and other things on  the floor that can make you trip. What can I do with my stairs?  Do not leave any items on the stairs.  Make sure that there are handrails on both sides of the stairs and use them. Fix handrails that are broken or loose. Make sure that handrails are as long as the stairways.  Check any carpeting to make sure that it is firmly attached to the stairs. Fix any carpet that is loose or worn.  Avoid having throw rugs at the top or bottom of the stairs. If you do have throw rugs, attach them to the floor with carpet tape.  Make sure that you have a light switch at the top of the stairs and the bottom of the stairs. If you do not have them, ask someone to add them for you. What else can I do to help prevent falls?  Wear shoes that:  Do not have high heels.  Have rubber bottoms.  Are comfortable and fit you well.  Are closed at the toe. Do not wear sandals.  If you use a stepladder:  Make sure that it is fully opened. Do not climb a closed stepladder.  Make sure that both sides of the stepladder are locked into place.  Ask someone to hold it for you, if possible.  Clearly mark and make sure that you can see:  Any grab bars or handrails.  First and last steps.  Where the edge of each step is.  Use tools that help you move around (mobility aids) if they are needed. These include:  Canes.  Walkers.  Scooters.  Crutches.  Turn on the lights when you go into a dark area. Replace any light bulbs as soon as they burn out.  Set up your furniture so you have a clear path. Avoid moving your furniture around.  If any of your floors are uneven, fix them.  If there are any pets around you, be aware of where they are.  Review your medicines with your doctor. Some medicines can make you feel dizzy. This can increase your chance of falling. Ask your doctor what other things that you can do to help prevent falls. This information is not intended to replace advice given to you by  your health care provider. Make sure you discuss any questions you have with your health care provider. Document Released: 12/18/2008 Document Revised: 07/30/2015 Document Reviewed: 03/28/2014 Elsevier Interactive Patient Education  2017 [employer].

## 2020-02-18 NOTE — Assessment & Plan Note (Addendum)
A1c 7.7% Metformin, Prandin, Bromocriptine; Irbesartan HCT F/u w/dr Loanne Drilling

## 2020-02-21 DIAGNOSIS — H26493 Other secondary cataract, bilateral: Secondary | ICD-10-CM | POA: Diagnosis not present

## 2020-02-21 DIAGNOSIS — H35342 Macular cyst, hole, or pseudohole, left eye: Secondary | ICD-10-CM | POA: Diagnosis not present

## 2020-02-21 DIAGNOSIS — H35033 Hypertensive retinopathy, bilateral: Secondary | ICD-10-CM | POA: Diagnosis not present

## 2020-02-21 DIAGNOSIS — E119 Type 2 diabetes mellitus without complications: Secondary | ICD-10-CM | POA: Diagnosis not present

## 2020-03-20 ENCOUNTER — Other Ambulatory Visit: Payer: Self-pay | Admitting: *Deleted

## 2020-03-20 MED ORDER — ALLOPURINOL 100 MG PO TABS: 100.0000 mg | ORAL_TABLET | Freq: Every day | ORAL | 3 refills | Status: DC

## 2020-03-20 MED ORDER — TAMSULOSIN HCL 0.4 MG PO CAPS
0.4000 mg | ORAL_CAPSULE | Freq: Every day | ORAL | 3 refills | Status: DC
Start: 2020-03-20 — End: 2020-11-18

## 2020-03-20 MED ORDER — IRBESARTAN-HYDROCHLOROTHIAZIDE 300-12.5 MG PO TABS: 1.0000 | ORAL_TABLET | Freq: Every day | ORAL | 3 refills | Status: DC

## 2020-03-20 MED ORDER — FINASTERIDE 5 MG PO TABS
5.0000 mg | ORAL_TABLET | Freq: Every morning | ORAL | 3 refills | Status: DC
Start: 2020-03-20 — End: 2020-11-18

## 2020-03-23 ENCOUNTER — Other Ambulatory Visit: Payer: Self-pay | Admitting: Endocrinology

## 2020-04-07 DIAGNOSIS — N5201 Erectile dysfunction due to arterial insufficiency: Secondary | ICD-10-CM | POA: Diagnosis not present

## 2020-04-10 ENCOUNTER — Telehealth: Payer: Self-pay | Admitting: Internal Medicine

## 2020-04-10 MED ORDER — LOVASTATIN 40 MG PO TABS
40.0000 mg | ORAL_TABLET | Freq: Every morning | ORAL | 3 refills | Status: DC
Start: 2020-04-10 — End: 2021-02-01

## 2020-04-10 MED ORDER — PANTOPRAZOLE SODIUM 40 MG PO TBEC
40.0000 mg | DELAYED_RELEASE_TABLET | Freq: Every morning | ORAL | 3 refills | Status: DC
Start: 2020-04-10 — End: 2021-02-01

## 2020-04-10 NOTE — Telephone Encounter (Signed)
Sent requested medication except for Metformin. Pt has to get through his endocrinologist../lmb

## 2020-04-10 NOTE — Telephone Encounter (Signed)
1.Medication Requested: pantoprazole (PROTONIX) 40 MG tablet  metFORMIN (GLUCOPHAGE) 500 MG tablet  lovastatin (MEVACOR) 40 MG tablet    2. Pharmacy (Name, [place], [facility]): [facility] - [address], [place]  3. On Med List: yes   4. Last Visit with PCP: 12.14.21  5. Next visit date with PCP: 3.1.21    Agent: Please be advised that RX refills may take up to 3 business days. We ask that you follow-up with your pharmacy.

## 2020-04-15 ENCOUNTER — Encounter: Payer: Self-pay | Admitting: Gastroenterology

## 2020-04-24 ENCOUNTER — Ambulatory Visit: Payer: Medicare Other | Admitting: Endocrinology

## 2020-04-24 ENCOUNTER — Ambulatory Visit (INDEPENDENT_AMBULATORY_CARE_PROVIDER_SITE_OTHER): Payer: Medicare HMO | Admitting: Endocrinology

## 2020-04-24 ENCOUNTER — Other Ambulatory Visit: Payer: Self-pay

## 2020-04-24 VITALS — BP 160/70 | HR 68 | Ht 69.0 in | Wt 232.2 lb

## 2020-04-24 DIAGNOSIS — E1159 Type 2 diabetes mellitus with other circulatory complications: Secondary | ICD-10-CM

## 2020-04-24 DIAGNOSIS — I110 Hypertensive heart disease with heart failure: Secondary | ICD-10-CM | POA: Diagnosis not present

## 2020-04-24 DIAGNOSIS — I509 Heart failure, unspecified: Secondary | ICD-10-CM | POA: Diagnosis not present

## 2020-04-24 LAB — POCT GLYCOSYLATED HEMOGLOBIN (HGB A1C): Hemoglobin A1C: 8.7 % — AB (ref 4.0–5.6)

## 2020-04-24 NOTE — Patient Instructions (Addendum)
Your blood pressure is high today.  Please see your primary care provider soon, to have it rechecked Please continue the same 4 diabetes medications.   check your blood sugar once a day.  vary the time of day when you check, between before the 3 meals, and at bedtime.  also check if you have symptoms of your blood sugar being too high or too low.  please keep a record of the readings and bring it to your next appointment here (or you can bring the meter itself).  You can write it on any piece of paper.  please call us sooner if your blood sugar goes below 70, or if you have a lot of readings over 200.   Please come back for a follow-up appointment in 3 months.

## 2020-04-24 NOTE — Progress Notes (Signed)
Subjective:    Patient ID: Johnny [name], male    DOB: Apr 14, 1945, 75 y.o.   MRN: [id number]  HPI Pt returns for f/u of diabetes mellitus:  DM type: 2 Dx'ed: 4403 Complications: CAD, PN, and CRI.   Therapy: 4 oral meds.  DKA: never Severe hypoglycemia: never Pancreatitis: never Pancreatic imaging: never SDOH: he cannot afford name brand meds.  Other: he has never been on insulin, except in the hospital, but he has learned how to take.  edema and CRI limit rx options and dosages.   Interval history: pt states he feels well in general.  He says cbg's are in the 100's.  he says he never misses meds.  Wife says pt is preparing to start a fast for lent.   Past Medical History:  Diagnosis Date  . Arthritis   . At risk for sleep apnea    STOP-BANG=  6   SENT TO PCP 05-21-2013  . Benign positional vertigo   . Benign prostatic hypertrophy   . Blood transfusion without reported diagnosis 2000  . CAD (coronary artery disease)    CARDIOLOGIST--  DR Cristopher Peru  . CHF (congestive heart failure) ([place])   . Chronic ischemic heart disease, unspecified   . ED (erectile dysfunction) of organic origin   . GERD (gastroesophageal reflux disease)   . History of CHF (congestive heart failure)    systolic  . HTN (hypertension)   . Hyperlipidemia   . Type 2 diabetes mellitus ([place])   . Wears glasses     Past Surgical History:  Procedure Laterality Date  . CARDIAC CATHETERIZATION  03-03-2000  dr gregg taylor   normal lvsf/  two-vessel cad significant complex stenosis at distal left main  . CARDIOVASCULAR STRESS TEST  08-09-2010  DR Carleene Overlie TAYLOR   normal lexiscan nuclear study/  no ischemia/  normal lvf/  ef 54%  . CATARACT EXTRACTION W/ INTRAOCULAR LENS IMPLANT Right 2011  . COLONOSCOPY  12-30-2009  . CORONARY ARTERY BYPASS GRAFT  03-06-2000   DR [id number]   third vessel  . PENILE PROSTHESIS IMPLANT  02-08-2010   COLOPLAST 3-PIECE INFLATABLE  . PENILE PROSTHESIS IMPLANT N/A 05/27/2013    Procedure: CYSTO REMOVAL OF PENILE PROSTHESIS;  Surgeon: Claybon Jabs, MD;  Location: [facility];  Service: Urology;  Laterality: N/A;  . POLYPECTOMY  12-30-2009   +TA  . TOTAL HIP ARTHROPLASTY  01/24/2011   Procedure: TOTAL HIP ARTHROPLASTY;  Surgeon: Dione Plover [name];  Location: [facility];  Service: Orthopedics;  Laterality: Left;  . TOTAL HIP ARTHROPLASTY Right 01/30/2013   Procedure: RIGHT TOTAL HIP ARTHROPLASTY;  Surgeon: Gearlean Alf, MD;  Location: [facility];  Service: Orthopedics;  Laterality: Right;    Social History   Socioeconomic History  . Marital status: Married    Spouse name: Not on file  . Number of children: Not on file  . Years of education: Not on file  . Highest education level: Not on file  Occupational History  . Occupation: Engineer, building services: RETIRED  Tobacco Use  . Smoking status: Former Smoker    Types: Cigarettes    Quit date: 05/22/1970    Years since quitting: 49.9  . Smokeless tobacco: Never Used  Vaping Use  . Vaping Use: Never used  Substance and Sexual Activity  . Alcohol use: No    Alcohol/week: 0.0 standard drinks  . Drug use: No  . Sexual activity: Not on file  Other Topics Concern  .  Not on file  Social History Narrative   Regular Exercise-No         Social Determinants of Health   Financial Resource Strain: Low Risk   . Difficulty of Paying Living Expenses: Not hard at all  Food Insecurity: No Food Insecurity  . Worried About Charity fundraiser in the Last Year: Never true  . Ran Out of Food in the Last Year: Never true  Transportation Needs: No Transportation Needs  . Lack of Transportation (Medical): No  . Lack of Transportation (Non-Medical): No  Physical Activity: Sufficiently Active  . Days of Exercise per Week: 5 days  . Minutes of Exercise per Session: 30 min  Stress: No Stress Concern Present  . Feeling of Stress : Not at all  Social Connections: Socially Integrated  . Frequency of Communication  with Friends and Family: More than three times a week  . Frequency of Social Gatherings with Friends and Family: More than three times a week  . Attends Religious Services: More than 4 times per year  . Active Member of Clubs or Organizations: Yes  . Attends Archivist Meetings: More than 4 times per year  . Marital Status: Married  Human resources officer Violence: Not on file    Current Outpatient Medications on File Prior to Visit  Medication Sig Dispense Refill  . acarbose (PRECOSE) 25 MG tablet Take 1 tablet (25 mg total) by mouth 3 (three) times daily with meals. 270 tablet 3  . albuterol (VENTOLIN HFA) 108 (90 Base) MCG/ACT inhaler INHALE 2 PUFFS INTO THE LUNGS EVERY 4 HOURS AS NEEDED FOR WHEEZING OR SHORTNESS OF BREATH 18 g 5  . Alcohol Swabs (B-D SINGLE USE SWABS REGULAR) PADS 1 each by Does not apply route 2 (two) times daily as needed. Dx: 250.00 100 each 3  . allopurinol (ZYLOPRIM) 100 MG tablet Take 1 tablet (100 mg total) by mouth daily. 90 tablet 3  . aspirin EC 81 MG tablet Take 81 mg by mouth daily.    . Blood Glucose Calibration (ACCU-CHEK AVIVA) SOLN Use as directed as needed. 1 each 2  . bromocriptine (PARLODEL) 2.5 MG tablet Take 1 tablet by mouth once daily 90 tablet 0  . cholecalciferol (VITAMIN D) 1000 UNITS tablet Take 1,000 Units by mouth every morning.    . colchicine 0.6 MG tablet Take 1 tablet (0.6 mg total) by mouth daily. 90 tablet 3  . diclofenac (VOLTAREN) 75 MG EC tablet TAKE 1 TABLET BY MOUTH  TWICE DAILY AS NEEDED FOR  MODERATE PAIN 180 tablet 3  . finasteride (PROSCAR) 5 MG tablet Take 1 tablet (5 mg total) by mouth every morning. 90 tablet 3  . fluticasone furoate-vilanterol (BREO ELLIPTA) 100-25 MCG/INH AEPB Inhale 1 puff into the lungs daily. 1 each 5  . irbesartan-hydrochlorothiazide (AVALIDE) 300-12.5 MG tablet Take 1 tablet by mouth daily. 90 tablet 3  . Lancet Devices (ACCU-CHEK SOFTCLIX) lancets Use twice daily as instructed. Dx: 250.00 1 each 3   . Lancets (ONETOUCH DELICA PLUS [id number]) MISC USE TWICE DAILY AS DIRECTED 200 each 3  . lovastatin (MEVACOR) 40 MG tablet Take 1 tablet (40 mg total) by mouth every morning. 90 tablet 3  . metFORMIN (GLUCOPHAGE) 500 MG tablet Take 1 tablet (500 mg total) by mouth daily with breakfast. 90 tablet 3  . Multiple Vitamins-Minerals (MULTI-VITAMIN GUMMIES PO)     . nitroGLYCERIN (NITROSTAT) 0.4 MG SL tablet Place 1 tablet (0.4 mg total) under the tongue every 5 (five) minutes  as needed for chest pain. 25 tablet 3  . ONETOUCH ULTRA test strip USE TWICE DAILY AS  INSTRUCTED 200 strip 3  . pantoprazole (PROTONIX) 40 MG tablet Take 1 tablet (40 mg total) by mouth every morning. 90 tablet 3  . repaglinide (PRANDIN) 1 MG tablet Take 1 tablet (1 mg total) by mouth 3 (three) times daily before meals. 270 tablet 3  . rivaroxaban (XARELTO) 20 MG TABS tablet Take 1 tablet (20 mg total) by mouth daily. 30 tablet 5  . tamsulosin (FLOMAX) 0.4 MG CAPS capsule Take 1 capsule (0.4 mg total) by mouth daily. 90 capsule 3   No current facility-administered medications on file prior to visit.    Allergies  Allergen Reactions  . Enalapril     SOB  . Hydrocodone-Acetaminophen Nausea Only    Can take codein    Family History  Problem Relation Age of Onset  . Hypertension Mother   . Diabetes Father   . Cancer - Other Brother        cancer all over  . Coronary artery disease Other        1st degree male relative  . Hypertension Other   . Cancer Sister 79       ovarian ca  . Colon cancer Neg Hx     BP (!) 160/70 (BP Location: Right Arm, Patient Position: Sitting, Cuff Size: Large)   Pulse 68   Ht 5\' 9"  (1.753 m)   Wt 232 lb 3.2 oz (105.3 kg)   SpO2 97%   BMI 34.29 kg/m    Review of Systems He denies hypoglycemia.      Objective:   Physical Exam VITAL SIGNS:  See vs page GENERAL: no distress Pulses: dorsalis pedis intact bilat.   MSK: no deformity of the feet CV: trace right and 2+ left leg  edema (recent DVT) Skin:  no ulcer on the feet.  normal color and temp on the feet. Neuro: sensation is intact to touch on the feet.   Ext: there is bilateral onychomycosis of the toenails.     Lab Results  Component Value Date   HGBA1C 8.7 (A) 04/24/2020   Lab Results  Component Value Date   CREATININE 1.34 02/04/2020   BUN 16 02/04/2020   NA 137 02/04/2020   K 4.0 02/04/2020   CL 101 02/04/2020   CO2 30 02/04/2020      Assessment & Plan:  HTN: is noted today.  Type 2 DM, with CRI: uncontrolled.  I advised pt to increase rx, but he declines.    Patient Instructions  Your blood pressure is high today.  Please see your primary care provider soon, to have it rechecked Please continue the same 4 diabetes medications.   check your blood sugar once a day.  vary the time of day when you check, between before the 3 meals, and at bedtime.  also check if you have symptoms of your blood sugar being too high or too low.  please keep a record of the readings and bring it to your next appointment here (or you can bring the meter itself).  You can write it on any piece of paper.  please call us sooner if your blood sugar goes below 70, or if you have a lot of readings over 200.   Please come back for a follow-up appointment in 3 months.

## 2020-04-29 ENCOUNTER — Encounter: Payer: Self-pay | Admitting: Gastroenterology

## 2020-05-01 ENCOUNTER — Ambulatory Visit (INDEPENDENT_AMBULATORY_CARE_PROVIDER_SITE_OTHER): Payer: Medicare HMO | Admitting: Internal Medicine

## 2020-05-01 ENCOUNTER — Encounter: Payer: Self-pay | Admitting: Internal Medicine

## 2020-05-01 ENCOUNTER — Other Ambulatory Visit: Payer: Self-pay

## 2020-05-01 VITALS — BP 110/62 | HR 66 | Temp 98.2°F | Ht 69.0 in | Wt 231.0 lb

## 2020-05-01 DIAGNOSIS — M25531 Pain in right wrist: Secondary | ICD-10-CM | POA: Diagnosis not present

## 2020-05-01 DIAGNOSIS — M7989 Other specified soft tissue disorders: Secondary | ICD-10-CM | POA: Diagnosis not present

## 2020-05-01 NOTE — Patient Instructions (Addendum)
Start the diclofenac 75 mg twice a day with food.  Take this for 1-2 days after your pain stops to make sure the pain goes away completely.     Continue all your other medications.    Work on getting your sugars better controlled.      Gout  Gout is a condition that causes painful swelling of the joints. Gout is a type of inflammation of the joints (arthritis). This condition is caused by having too much uric acid in the body. Uric acid is a chemical that forms when the body breaks down substances called purines. Purines are important for building body proteins. When the body has too much uric acid, sharp crystals can form and build up inside the joints. This causes pain and swelling. Gout attacks can happen quickly and may be very painful (acute gout). Over time, the attacks can affect more joints and become more frequent (chronic gout). Gout can also cause uric acid to build up under the skin and inside the kidneys. What are the causes? This condition is caused by too much uric acid in your blood. This can happen because:  Your kidneys do not remove enough uric acid from your blood. This is the most common cause.  Your body makes too much uric acid. This can happen with some cancers and cancer treatments. It can also occur if your body is breaking down too many red blood cells (hemolytic anemia).  You eat too many foods that are high in purines. These foods include organ meats and some seafood. Alcohol, especially beer, is also high in purines. A gout attack may be triggered by trauma or stress. What increases the risk? You are more likely to develop this condition if you:  Have a family history of gout.  Are male and middle-aged.  Are male and have gone through menopause.  Are obese.  Frequently drink alcohol, especially beer.  Are dehydrated.  Lose weight too quickly.  Have an organ transplant.  Have lead poisoning.  Take certain medicines, including aspirin,  cyclosporine, diuretics, levodopa, and niacin.  Have kidney disease.  Have a skin condition called psoriasis. What are the signs or symptoms? An attack of acute gout happens quickly. It usually occurs in just one joint. The most common place is the big toe. Attacks often start at night. Other joints that may be affected include joints of the feet, ankle, knee, fingers, wrist, or elbow. Symptoms of this condition may include:  Severe pain.  Warmth.  Swelling.  Stiffness.  Tenderness. The affected joint may be very painful to touch.  Shiny, red, or purple skin.  Chills and fever. Chronic gout may cause symptoms more frequently. More joints may be involved. You may also have white or yellow lumps (tophi) on your hands or feet or in other areas near your joints.   How is this diagnosed? This condition is diagnosed based on your symptoms, medical history, and physical exam. You may have tests, such as:  Blood tests to measure uric acid levels.  Removal of joint fluid with a thin needle (aspiration) to look for uric acid crystals.  X-rays to look for joint damage. How is this treated? Treatment for this condition has two phases: treating an acute attack and preventing future attacks. Acute gout treatment may include medicines to reduce pain and swelling, including:  NSAIDs.  Steroids. These are strong anti-inflammatory medicines that can be taken by mouth (orally) or injected into a joint.  Colchicine. This medicine relieves pain  and swelling when it is taken soon after an attack. It can be given by mouth or through an IV. Preventive treatment may include:  Daily use of smaller doses of NSAIDs or colchicine.  Use of a medicine that reduces uric acid levels in your blood.  Changes to your diet. You may need to see a dietitian about what to eat and drink to prevent gout. Follow these instructions at home: During a gout attack  If directed, put ice on the affected area: ? Put  ice in a plastic bag. ? Place a towel between your skin and the bag. ? Leave the ice on for 20 minutes, 2-3 times a day.  Raise (elevate) the affected joint above the level of your heart as often as possible.  Rest the joint as much as possible. If the affected joint is in your leg, you may be given crutches to use.  Follow instructions from your health care provider about eating or drinking restrictions.   Avoiding future gout attacks  Follow a low-purine diet as told by your dietitian or health care provider. Avoid foods and drinks that are high in purines, including liver, kidney, anchovies, asparagus, herring, mushrooms, mussels, and beer.  Maintain a healthy weight or lose weight if you are overweight. If you want to lose weight, talk with your health care provider. It is important that you do not lose weight too quickly.  Start or maintain an exercise program as told by your health care provider. Eating and drinking  Drink enough fluids to keep your urine pale yellow.  If you drink alcohol: ? Limit how much you use to:  0-1 drink a day for women.  0-2 drinks a day for men. ? Be aware of how much alcohol is in your drink. In the [place] one drink equals one 12 oz bottle of beer (355 mL) one 5 oz glass of wine (148 mL), or one 1 oz glass of hard liquor (44 mL). General instructions  Take over-the-counter and prescription medicines only as told by your health care provider.  Do not drive or use heavy machinery while taking prescription pain medicine.  Return to your normal activities as told by your health care provider. Ask your health care provider what activities are safe for you.  Keep all follow-up visits as told by your health care provider. This is important. Contact a health care provider if you have:  Another gout attack.  Continuing symptoms of a gout attack after 10 days of treatment.  Side effects from your medicines.  Chills or a fever.  Burning pain when  you urinate.  Pain in your lower back or belly. Get help right away if you:  Have severe or uncontrolled pain.  Cannot urinate. Summary  Gout is painful swelling of the joints caused by inflammation.  The most common site of pain is the big toe, but it can affect other joints in the body.  Medicines and dietary changes can help to prevent and treat gout attacks. This information is not intended to replace advice given to you by your health care provider. Make sure you discuss any questions you have with your health care provider. Document Revised: 09/13/2017 Document Reviewed: 09/13/2017 Elsevier Patient Education  [place].

## 2020-05-01 NOTE — Progress Notes (Signed)
Subjective:    Patient ID: Johnny [name], male    DOB: 1945/04/11, 75 y.o.   MRN: [id number]  HPI The patient is here for an acute visit.   Right wrist pain, right hand swelling:  It started last Friday.  His right wrist was hurting and the next day his right hand was swollen.  The swelling has gotten worse.     No injury.    He soaked it in epsom salt, alcohol and put ice in it.  He too tylenol, ibuprofen and a celebrex.   He had a blood clot in leg in October.  He is taking xarelto.  He was worried about a blood clot.    H/o gout in feet.  He takes colchicine daily and allopurinol daily and diclofenac as needed. He rarely takes this and has not tried it.  His sugars are not controlled.    Medications and allergies reviewed with patient and updated if appropriate.  Patient Active Problem List   Diagnosis Date Noted  . Left leg DVT ([place]) 01/13/2020  . Pure hyperglyceridemia 12/16/2019  . Hyperlipidemia LDL goal <70 12/16/2019  . Pain and swelling of lower leg, left 12/16/2019  . Erectile dysfunction 07/13/2015  . Well adult exam 06/12/2015  . OA (osteoarthritis) of hip 01/30/2013  . Osteoarthritis of right hip 10/19/2012  . Primary osteoarthritis of left hip 01/26/2011  . COSTOCHONDRITIS 08/10/2009  . TOBACCO USE, QUIT 12/23/2008  . BLADDER OUTLET OBSTRUCTION 10/24/2008  . DEGENERATIVE JOINT DISEASE, LEFT HIP 10/08/2007  . ISCHEMIC HEART DISEASE 08/07/2007  . Arthropathy 08/07/2007  . DM type 2 causing vascular disease ([place]) 03/02/2007  . Essential hypertension 03/02/2007  . Coronary atherosclerosis 03/02/2007  . GERD 03/02/2007    Current Outpatient Medications on File Prior to Visit  Medication Sig Dispense Refill  . acarbose (PRECOSE) 25 MG tablet Take 1 tablet (25 mg total) by mouth 3 (three) times daily with meals. 270 tablet 3  . albuterol (VENTOLIN HFA) 108 (90 Base) MCG/ACT inhaler INHALE 2 PUFFS INTO THE LUNGS EVERY 4 HOURS AS NEEDED FOR WHEEZING OR  SHORTNESS OF BREATH 18 g 5  . Alcohol Swabs (B-D SINGLE USE SWABS REGULAR) PADS 1 each by Does not apply route 2 (two) times daily as needed. Dx: 250.00 100 each 3  . allopurinol (ZYLOPRIM) 100 MG tablet Take 1 tablet (100 mg total) by mouth daily. 90 tablet 3  . aspirin EC 81 MG tablet Take 81 mg by mouth daily.    . Blood Glucose Calibration (ACCU-CHEK AVIVA) SOLN Use as directed as needed. 1 each 2  . bromocriptine (PARLODEL) 2.5 MG tablet Take 1 tablet by mouth once daily 90 tablet 0  . cholecalciferol (VITAMIN D) 1000 UNITS tablet Take 1,000 Units by mouth every morning.    . colchicine 0.6 MG tablet Take 1 tablet (0.6 mg total) by mouth daily. 90 tablet 3  . diclofenac (VOLTAREN) 75 MG EC tablet TAKE 1 TABLET BY MOUTH  TWICE DAILY AS NEEDED FOR  MODERATE PAIN 180 tablet 3  . finasteride (PROSCAR) 5 MG tablet Take 1 tablet (5 mg total) by mouth every morning. 90 tablet 3  . fluticasone furoate-vilanterol (BREO ELLIPTA) 100-25 MCG/INH AEPB Inhale 1 puff into the lungs daily. 1 each 5  . irbesartan-hydrochlorothiazide (AVALIDE) 300-12.5 MG tablet Take 1 tablet by mouth daily. 90 tablet 3  . Lancet Devices (ACCU-CHEK SOFTCLIX) lancets Use twice daily as instructed. Dx: 250.00 1 each 3  . Lancets [facility]  PLUS LANCET33G) MISC USE TWICE DAILY AS DIRECTED 200 each 3  . lovastatin (MEVACOR) 40 MG tablet Take 1 tablet (40 mg total) by mouth every morning. 90 tablet 3  . metFORMIN (GLUCOPHAGE) 500 MG tablet Take 1 tablet (500 mg total) by mouth daily with breakfast. 90 tablet 3  . Multiple Vitamins-Minerals (MULTI-VITAMIN GUMMIES PO)     . nitroGLYCERIN (NITROSTAT) 0.4 MG SL tablet Place 1 tablet (0.4 mg total) under the tongue every 5 (five) minutes as needed for chest pain. 25 tablet 3  . ONETOUCH ULTRA test strip USE TWICE DAILY AS  INSTRUCTED 200 strip 3  . pantoprazole (PROTONIX) 40 MG tablet Take 1 tablet (40 mg total) by mouth every morning. 90 tablet 3  . repaglinide (PRANDIN) 1 MG  tablet Take 1 tablet (1 mg total) by mouth 3 (three) times daily before meals. 270 tablet 3  . rivaroxaban (XARELTO) 20 MG TABS tablet Take 1 tablet (20 mg total) by mouth daily. 30 tablet 5  . tamsulosin (FLOMAX) 0.4 MG CAPS capsule Take 1 capsule (0.4 mg total) by mouth daily. 90 capsule 3   No current facility-administered medications on file prior to visit.    Past Medical History:  Diagnosis Date  . Arthritis   . At risk for sleep apnea    STOP-BANG=  6   SENT TO PCP 05-21-2013  . Benign positional vertigo   . Benign prostatic hypertrophy   . Blood transfusion without reported diagnosis 2000  . CAD (coronary artery disease)    CARDIOLOGIST--  DR Cristopher [place]  . CHF (congestive heart failure) ([place])   . Chronic ischemic heart disease, unspecified   . ED (erectile dysfunction) of organic origin   . GERD (gastroesophageal reflux disease)   . History of CHF (congestive heart failure)    systolic  . HTN (hypertension)   . Hyperlipidemia   . Type 2 diabetes mellitus ([place])   . Wears glasses     Past Surgical History:  Procedure Laterality Date  . CARDIAC CATHETERIZATION  03-03-2000  dr gregg taylor   normal lvsf/  two-vessel cad significant complex stenosis at distal left main  . CARDIOVASCULAR STRESS TEST  08-09-2010  DR Carleene Overlie TAYLOR   normal lexiscan nuclear study/  no ischemia/  normal lvf/  ef 54%  . CATARACT EXTRACTION W/ INTRAOCULAR LENS IMPLANT Right 2011  . COLONOSCOPY  12-30-2009  . CORONARY ARTERY BYPASS GRAFT  03-06-2000   DR [id number]   third vessel  . PENILE PROSTHESIS IMPLANT  02-08-2010   COLOPLAST 3-PIECE INFLATABLE  . PENILE PROSTHESIS IMPLANT N/A 05/27/2013   Procedure: CYSTO REMOVAL OF PENILE PROSTHESIS;  Surgeon: Claybon Jabs, MD;  Location: [facility];  Service: Urology;  Laterality: N/A;  . POLYPECTOMY  12-30-2009   +TA  . TOTAL HIP ARTHROPLASTY  01/24/2011   Procedure: TOTAL HIP ARTHROPLASTY;  Surgeon: Dione Plover [name];  Location: [facility]  [facility];  Service: Orthopedics;  Laterality: Left;  . TOTAL HIP ARTHROPLASTY Right 01/30/2013   Procedure: RIGHT TOTAL HIP ARTHROPLASTY;  Surgeon: Gearlean Alf, MD;  Location: [facility];  Service: Orthopedics;  Laterality: Right;    Social History   Socioeconomic History  . Marital status: Married    Spouse name: Not on file  . Number of children: Not on file  . Years of education: Not on file  . Highest education level: Not on file  Occupational History  . Occupation: Engineer, building services: RETIRED  Tobacco Use  .  Smoking status: Former Smoker    Types: Cigarettes    Quit date: 05/22/1970    Years since quitting: 49.9  . Smokeless tobacco: Never Used  Vaping Use  . Vaping Use: Never used  Substance and Sexual Activity  . Alcohol use: No    Alcohol/week: 0.0 standard drinks  . Drug use: No  . Sexual activity: Not on file  Other Topics Concern  . Not on file  Social History Narrative   Regular Exercise-No         Social Determinants of Health   Financial Resource Strain: Low Risk   . Difficulty of Paying Living Expenses: Not hard at all  Food Insecurity: No Food Insecurity  . Worried About Charity fundraiser in the Last Year: Never true  . Ran Out of Food in the Last Year: Never true  Transportation Needs: No Transportation Needs  . Lack of Transportation (Medical): No  . Lack of Transportation (Non-Medical): No  Physical Activity: Sufficiently Active  . Days of Exercise per Week: 5 days  . Minutes of Exercise per Session: 30 min  Stress: No Stress Concern Present  . Feeling of Stress : Not at all  Social Connections: Socially Integrated  . Frequency of Communication with Friends and Family: More than three times a week  . Frequency of Social Gatherings with Friends and Family: More than three times a week  . Attends Religious Services: More than 4 times per year  . Active Member of Clubs or Organizations: Yes  . Attends Archivist Meetings: More than 4  times per year  . Marital Status: Married    Family History  Problem Relation Age of Onset  . Hypertension Mother   . Diabetes Father   . Cancer - Other Brother        cancer all over  . Coronary artery disease Other        1st degree male relative  . Hypertension Other   . Cancer Sister 67       ovarian ca  . Colon cancer Neg Hx     Review of Systems  Constitutional: Negative for chills and fever.  Musculoskeletal: Positive for arthralgias and joint swelling.  Skin: Negative for color change, rash and wound.       Objective:   Vitals:   05/01/20 1523  BP: 110/62  Pulse: 66  Temp: 98.2 F (36.8 C)  SpO2: 97%   BP Readings from Last 3 Encounters:  05/01/20 110/62  04/24/20 (!) 160/70  02/18/20 140/80   Wt Readings from Last 3 Encounters:  05/01/20 231 lb (104.8 kg)  04/24/20 232 lb 3.2 oz (105.3 kg)  02/18/20 230 lb 4.8 oz (104.5 kg)   Body mass index is 34.11 kg/m.   Physical Exam Constitutional:      General: He is not in acute distress.    Appearance: Normal appearance. He is not ill-appearing.  Musculoskeletal:     Comments: Right wrist tenderness with palpation, active/passive movement.  Right had swelling.  Mild warmth to palpation, no erythema.  No forearm or upper arm tenderness or swelling  Skin:    General: Skin is warm and dry.     Findings: No erythema.  Neurological:     Mental Status: He is alert.     Sensory: No sensory deficit.     Motor: No weakness.            Assessment & Plan:    Right wrist pain, right hand  swelling: Acute Started one week ago w/o injury or new activity Unlikely clot due to her taking xarelto Sugars uncontrolled and h/o of gout Symptoms likely related to gout Given uncontrolled sugars will avoid prednisone Start diclofenac 75 mg BID with food. Take for 1-2 days after pain stops Has routine f/u appt with PCP on Monday - can f/u then   This visit occurred during the SARS-CoV-2 public health  emergency.  Safety protocols were in place, including screening questions prior to the visit, additional usage of staff PPE, and extensive cleaning of exam room while observing appropriate contact time as indicated for disinfecting solutions.

## 2020-05-04 ENCOUNTER — Other Ambulatory Visit: Payer: Self-pay

## 2020-05-04 ENCOUNTER — Ambulatory Visit (INDEPENDENT_AMBULATORY_CARE_PROVIDER_SITE_OTHER): Payer: Medicare HMO | Admitting: Internal Medicine

## 2020-05-04 ENCOUNTER — Encounter: Payer: Self-pay | Admitting: Internal Medicine

## 2020-05-04 DIAGNOSIS — M10079 Idiopathic gout, unspecified ankle and foot: Secondary | ICD-10-CM | POA: Diagnosis not present

## 2020-05-04 DIAGNOSIS — H6120 Impacted cerumen, unspecified ear: Secondary | ICD-10-CM | POA: Insufficient documentation

## 2020-05-04 DIAGNOSIS — H6123 Impacted cerumen, bilateral: Secondary | ICD-10-CM | POA: Diagnosis not present

## 2020-05-04 MED ORDER — COLCHICINE 0.6 MG PO TABS
0.6000 mg | ORAL_TABLET | Freq: Every day | ORAL | 3 refills | Status: DC
Start: 2020-05-04 — End: 2024-02-02

## 2020-05-04 MED ORDER — METHYLPREDNISOLONE 4 MG PO TBPK: ORAL_TABLET | ORAL | 0 refills | Status: DC

## 2020-05-04 NOTE — Progress Notes (Signed)
Subjective:  Patient ID: Johnny [name], male    DOB: [birth year]  Age: 75 y.o. MRN: [id number]  CC: Follow-up (3 month f/u)   HPI RILEE WENDLING presents for R wrist gout attack  - po Voltaren helped He is better. F/u DM, HTN, gout; he was told he has wax B  Outpatient Medications Prior to Visit  Medication Sig Dispense Refill  . acarbose (PRECOSE) 25 MG tablet Take 1 tablet (25 mg total) by mouth 3 (three) times daily with meals. 270 tablet 3  . albuterol (VENTOLIN HFA) 108 (90 Base) MCG/ACT inhaler INHALE 2 PUFFS INTO THE LUNGS EVERY 4 HOURS AS NEEDED FOR WHEEZING OR SHORTNESS OF BREATH 18 g 5  . Alcohol Swabs (B-D SINGLE USE SWABS REGULAR) PADS 1 each by Does not apply route 2 (two) times daily as needed. Dx: 250.00 100 each 3  . allopurinol (ZYLOPRIM) 100 MG tablet Take 1 tablet (100 mg total) by mouth daily. 90 tablet 3  . aspirin EC 81 MG tablet Take 81 mg by mouth daily.    . Blood Glucose Calibration (ACCU-CHEK AVIVA) SOLN Use as directed as needed. 1 each 2  . bromocriptine (PARLODEL) 2.5 MG tablet Take 1 tablet by mouth once daily 90 tablet 0  . cholecalciferol (VITAMIN D) 1000 UNITS tablet Take 1,000 Units by mouth every morning.    . colchicine 0.6 MG tablet Take 1 tablet (0.6 mg total) by mouth daily. 90 tablet 3  . diclofenac (VOLTAREN) 75 MG EC tablet TAKE 1 TABLET BY MOUTH  TWICE DAILY AS NEEDED FOR  MODERATE PAIN 180 tablet 3  . finasteride (PROSCAR) 5 MG tablet Take 1 tablet (5 mg total) by mouth every morning. 90 tablet 3  . fluticasone furoate-vilanterol (BREO ELLIPTA) 100-25 MCG/INH AEPB Inhale 1 puff into the lungs daily. 1 each 5  . irbesartan-hydrochlorothiazide (AVALIDE) 300-12.5 MG tablet Take 1 tablet by mouth daily. 90 tablet 3  . Lancet Devices (ACCU-CHEK SOFTCLIX) lancets Use twice daily as instructed. Dx: 250.00 1 each 3  . Lancets (ONETOUCH DELICA PLUS [id number]) MISC USE TWICE DAILY AS DIRECTED 200 each 3  . lovastatin (MEVACOR) 40 MG tablet Take 1  tablet (40 mg total) by mouth every morning. 90 tablet 3  . metFORMIN (GLUCOPHAGE) 500 MG tablet Take 1 tablet (500 mg total) by mouth daily with breakfast. 90 tablet 3  . Multiple Vitamins-Minerals (MULTI-VITAMIN GUMMIES PO)     . nitroGLYCERIN (NITROSTAT) 0.4 MG SL tablet Place 1 tablet (0.4 mg total) under the tongue every 5 (five) minutes as needed for chest pain. 25 tablet 3  . ONETOUCH ULTRA test strip USE TWICE DAILY AS  INSTRUCTED 200 strip 3  . pantoprazole (PROTONIX) 40 MG tablet Take 1 tablet (40 mg total) by mouth every morning. 90 tablet 3  . repaglinide (PRANDIN) 1 MG tablet Take 1 tablet (1 mg total) by mouth 3 (three) times daily before meals. 270 tablet 3  . rivaroxaban (XARELTO) 20 MG TABS tablet Take 1 tablet (20 mg total) by mouth daily. 30 tablet 5  . tamsulosin (FLOMAX) 0.4 MG CAPS capsule Take 1 capsule (0.4 mg total) by mouth daily. 90 capsule 3   No facility-administered medications prior to visit.    ROS: Review of Systems  Constitutional: Negative for appetite change, fatigue and unexpected weight change.  HENT: Positive for hearing loss. Negative for congestion, nosebleeds, sneezing, sore throat and trouble swallowing.   Eyes: Negative for itching and visual disturbance.  Respiratory: Negative for  cough.   Cardiovascular: Negative for chest pain, palpitations and leg swelling.  Gastrointestinal: Negative for abdominal distention, blood in stool, diarrhea and nausea.  Genitourinary: Negative for frequency and hematuria.  Musculoskeletal: Positive for arthralgias. Negative for back pain, gait problem, joint swelling and neck pain.  Skin: Negative for rash.  Neurological: Negative for dizziness, tremors, speech difficulty and weakness.  Psychiatric/Behavioral: Negative for agitation, dysphoric mood and sleep disturbance. The patient is not nervous/anxious.     Objective:  BP (!) 152/70 (BP Location: Left Arm)   Pulse (!) 58   Temp 97.9 F (36.6 C) (Oral)   Ht  5\' 9"  (1.753 m)   Wt 231 lb 9.6 oz (105.1 kg)   SpO2 98%   BMI 34.20 kg/m   BP Readings from Last 3 Encounters:  05/04/20 (!) 152/70  05/01/20 110/62  04/24/20 (!) 160/70    Wt Readings from Last 3 Encounters:  05/04/20 231 lb 9.6 oz (105.1 kg)  05/01/20 231 lb (104.8 kg)  04/24/20 232 lb 3.2 oz (105.3 kg)    Physical Exam Constitutional:      General: He is not in acute distress.    Appearance: He is well-developed. He is obese.     Comments: NAD  HENT:     Mouth/Throat:     Mouth: Oropharynx is clear and moist.  Eyes:     Conjunctiva/sclera: Conjunctivae normal.     Pupils: Pupils are equal, round, and reactive to light.  Neck:     Thyroid: No thyromegaly.     Vascular: No JVD.  Cardiovascular:     Rate and Rhythm: Normal rate and regular rhythm.     Pulses: Intact distal pulses.     Heart sounds: Normal heart sounds. No murmur heard. No friction rub. No gallop.   Pulmonary:     Effort: Pulmonary effort is normal. No respiratory distress.     Breath sounds: Normal breath sounds. No wheezing or rales.  Chest:     Chest [place]: No tenderness.  Abdominal:     General: Bowel sounds are normal. There is no distension.     Palpations: Abdomen is soft. There is no mass.     Tenderness: There is no abdominal tenderness. There is no guarding or rebound.  Musculoskeletal:        General: Tenderness present. No edema. Normal range of motion.     Cervical back: Normal range of motion.  Lymphadenopathy:     Cervical: No cervical adenopathy.  Skin:    General: Skin is warm and dry.     Findings: No rash.  Neurological:     Mental Status: He is alert and oriented to person, place, and time.     Cranial Nerves: No cranial nerve deficit.     Motor: No abnormal muscle tone.     Coordination: He displays a negative Romberg sign. Coordination normal.     Gait: Gait abnormal.     Deep Tendon Reflexes: Reflexes are normal and symmetric.  Psychiatric:        Mood and Affect:  Mood and affect normal.        Behavior: Behavior normal.        Thought Content: Thought content normal.        Judgment: Judgment normal.   R wrist w/swelling - better  No wax B  Lab Results  Component Value Date   WBC 7.1 12/16/2019   HGB 12.4 (L) 12/16/2019   HCT 36.5 (L) 12/16/2019   PLT  230.0 12/16/2019   GLUCOSE 219 (H) 02/04/2020   CHOL 167 09/09/2016   TRIG 371.0 (H) 09/09/2016   HDL 31.40 (L) 09/09/2016   LDLDIRECT 73.0 09/09/2016   LDLCALC 74 11/29/2013   ALT 14 05/07/2018   AST 10 05/07/2018   NA 137 02/04/2020   K 4.0 02/04/2020   CL 101 02/04/2020   CREATININE 1.34 02/04/2020   BUN 16 02/04/2020   CO2 30 02/04/2020   TSH 3.59 10/14/2010   PSA 1.0 11/14/2019   INR 1.15 01/21/2013   HGBA1C 8.7 (A) 04/24/2020   MICROALBUR <0.7 09/09/2016    VAS [place] LOWER EXTREMITY VENOUS (DVT)  Result Date: 12/17/2019  Lower Venous DVTStudy Indications: Right calf pain/ tightness.  Comparison Study: none Performing Technologist: June Leap RDMS, RVT  Examination Guidelines: A complete evaluation includes B-mode imaging, spectral Doppler, color Doppler, and power Doppler as needed of all accessible portions of each vessel. Bilateral testing is considered an integral part of a complete examination. Limited examinations for reoccurring indications may be performed as noted. The reflux portion of the exam is performed with the patient in reverse Trendelenburg.  +-----+---------------+---------+-----------+----------+--------------+ RIGHTCompressibilityPhasicitySpontaneityPropertiesThrombus Aging +-----+---------------+---------+-----------+----------+--------------+ CFV  Full           Yes      Yes                                 +-----+---------------+---------+-----------+----------+--------------+   +---------+---------------+---------+-----------+----------+--------------+ LEFT     CompressibilityPhasicitySpontaneityPropertiesThrombus Aging  +---------+---------------+---------+-----------+----------+--------------+ CFV      Full           Yes      Yes                                 +---------+---------------+---------+-----------+----------+--------------+ SFJ      Full                                                        +---------+---------------+---------+-----------+----------+--------------+ FV Prox  Full                                                        +---------+---------------+---------+-----------+----------+--------------+ FV Mid   Partial        Yes      Yes                  Acute          +---------+---------------+---------+-----------+----------+--------------+ FV DistalNone           No       No                   Acute          +---------+---------------+---------+-----------+----------+--------------+ PFV      Full                                                        +---------+---------------+---------+-----------+----------+--------------+  POP      None           No       No                   Acute          +---------+---------------+---------+-----------+----------+--------------+ PTV      None                                         Acute          +---------+---------------+---------+-----------+----------+--------------+ PERO     None                                         Acute          +---------+---------------+---------+-----------+----------+--------------+ Gastroc  None                                         Acute          +---------+---------------+---------+-----------+----------+--------------+ GSV      Full                                                        +---------+---------------+---------+-----------+----------+--------------+    Findings reported to [facility] @ Dr. Ronnald Ramp' office at 3:18 pm.  Summary: RIGHT: - No evidence of common femoral vein obstruction.  LEFT: - Findings consistent with acute deep vein thrombosis involving  the left femoral vein, left popliteal vein, left posterior tibial veins, left peroneal veins, and left gastrocnemius veins.  *See table(s) above for measurements and observations. Electronically signed by Monica Martinez MD on 12/17/2019 at 4:40:56 PM.    Final     Assessment & Plan:   There are no diagnoses linked to this encounter.   No orders of the defined types were placed in this encounter.    Follow-up: No follow-ups on file.  Walker Kehr, MD

## 2020-05-04 NOTE — Assessment & Plan Note (Signed)
Diclofenacd/c Medrol and colchicine prn  R hand swelling x 2 d; h/o gout

## 2020-05-04 NOTE — Assessment & Plan Note (Addendum)
B ears are clear now

## 2020-05-05 ENCOUNTER — Ambulatory Visit: Payer: Medicare Other | Admitting: Internal Medicine

## 2020-06-12 ENCOUNTER — Ambulatory Visit (INDEPENDENT_AMBULATORY_CARE_PROVIDER_SITE_OTHER): Payer: Medicare HMO | Admitting: Gastroenterology

## 2020-06-12 ENCOUNTER — Encounter: Payer: Self-pay | Admitting: Gastroenterology

## 2020-06-12 ENCOUNTER — Telehealth: Payer: Self-pay

## 2020-06-12 VITALS — BP 140/70 | HR 70 | Ht 69.0 in | Wt 231.4 lb

## 2020-06-12 DIAGNOSIS — Z7901 Long term (current) use of anticoagulants: Secondary | ICD-10-CM | POA: Diagnosis not present

## 2020-06-12 DIAGNOSIS — K219 Gastro-esophageal reflux disease without esophagitis: Secondary | ICD-10-CM

## 2020-06-12 DIAGNOSIS — Z86010 Personal history of colonic polyps: Secondary | ICD-10-CM

## 2020-06-12 MED ORDER — NA SULFATE-K SULFATE-MG SULF 17.5-3.13-1.6 GM/177ML PO SOLN: 1.0000 | Freq: Once | ORAL | 0 refills | Status: AC

## 2020-06-12 NOTE — Progress Notes (Signed)
History of Present Illness: This is a 75 year old male referred by Plotnikov, Evie Lacks, MD for the evaluation of a personal history of adenomatous colon polyps.  He is accompanied by his wife.  His reflux symptoms are well controlled on daily pantoprazole.  He developed a DVT in October 2021 and was placed on Xarelto.  The initial plan was for 76-month course and the 57-month mark will be reached later this month.  Denies weight loss, abdominal pain, constipation, diarrhea, change in stool caliber, melena, hematochezia, nausea, vomiting, dysphagia, chest pain.  Colonoscopy 2016 showed diverticulosis, internal hemorrhoid, hyperplastic polyps Colonoscopy 2011 showed diverticulosis, one small tubular adenoma   Allergies  Allergen Reactions  . Enalapril     SOB  . Hydrocodone-Acetaminophen Nausea Only    Can take codein   Outpatient Medications Prior to Visit  Medication Sig Dispense Refill  . albuterol (VENTOLIN HFA) 108 (90 Base) MCG/ACT inhaler INHALE 2 PUFFS INTO THE LUNGS EVERY 4 HOURS AS NEEDED FOR WHEEZING OR SHORTNESS OF BREATH 18 g 5  . Alcohol Swabs (B-D SINGLE USE SWABS REGULAR) PADS 1 each by Does not apply route 2 (two) times daily as needed. Dx: 250.00 100 each 3  . allopurinol (ZYLOPRIM) 100 MG tablet Take 1 tablet (100 mg total) by mouth daily. 90 tablet 3  . aspirin EC 81 MG tablet Take 81 mg by mouth daily.    . Blood Glucose Calibration (ACCU-CHEK AVIVA) SOLN Use as directed as needed. (Patient taking differently: Check BID) 1 each 2  . bromocriptine (PARLODEL) 2.5 MG tablet Take 1 tablet by mouth once daily 90 tablet 0  . cholecalciferol (VITAMIN D) 1000 UNITS tablet Take 1,000 Units by mouth every morning.    . colchicine 0.6 MG tablet Take 1 tablet (0.6 mg total) by mouth daily. 90 tablet 3  . finasteride (PROSCAR) 5 MG tablet Take 1 tablet (5 mg total) by mouth every morning. 90 tablet 3  . fluticasone furoate-vilanterol (BREO ELLIPTA) 100-25 MCG/INH AEPB Inhale 1 puff  into the lungs daily. 1 each 5  . irbesartan-hydrochlorothiazide (AVALIDE) 300-12.5 MG tablet Take 1 tablet by mouth daily. 90 tablet 3  . Lancet Devices (ACCU-CHEK SOFTCLIX) lancets Use twice daily as instructed. Dx: 250.00 1 each 3  . Lancets (ONETOUCH DELICA PLUS [id number]) MISC USE TWICE DAILY AS DIRECTED 200 each 3  . lovastatin (MEVACOR) 40 MG tablet Take 1 tablet (40 mg total) by mouth every morning. 90 tablet 3  . metFORMIN (GLUCOPHAGE) 500 MG tablet Take 1 tablet (500 mg total) by mouth daily with breakfast. 90 tablet 3  . Multiple Vitamins-Minerals (MULTI-VITAMIN GUMMIES PO) Take 1 tablet by mouth daily.    . nitroGLYCERIN (NITROSTAT) 0.4 MG SL tablet Place 1 tablet (0.4 mg total) under the tongue every 5 (five) minutes as needed for chest pain. 25 tablet 3  . ONETOUCH ULTRA test strip USE TWICE DAILY AS  INSTRUCTED 200 strip 3  . pantoprazole (PROTONIX) 40 MG tablet Take 1 tablet (40 mg total) by mouth every morning. 90 tablet 3  . repaglinide (PRANDIN) 1 MG tablet Take 1 tablet (1 mg total) by mouth 3 (three) times daily before meals. 270 tablet 3  . rivaroxaban (XARELTO) 20 MG TABS tablet Take 1 tablet (20 mg total) by mouth daily. 30 tablet 5  . tamsulosin (FLOMAX) 0.4 MG CAPS capsule Take 1 capsule (0.4 mg total) by mouth daily. 90 capsule 3  . acarbose (PRECOSE) 25 MG tablet Take 1 tablet (25  mg total) by mouth 3 (three) times daily with meals. (Patient not taking: Reported on 06/12/2020) 270 tablet 3  . methylPREDNISolone (MEDROL DOSEPAK) 4 MG TBPK tablet As directed 21 tablet 0   No facility-administered medications prior to visit.   Past Medical History:  Diagnosis Date  . Arthritis   . At risk for sleep apnea    STOP-BANG=  6   SENT TO PCP 05-21-2013  . Benign positional vertigo   . Benign prostatic hypertrophy   . Blood transfusion without reported diagnosis 2000  . CAD (coronary artery disease)    CARDIOLOGIST--  DR Cristopher Peru  . CHF (congestive heart failure) ([place])    . Chronic ischemic heart disease, unspecified   . ED (erectile dysfunction) of organic origin   . GERD (gastroesophageal reflux disease)   . History of CHF (congestive heart failure)    systolic  . HTN (hypertension)   . Hyperlipidemia   . Tubular adenoma of colon 12/2009  . Type 2 diabetes mellitus ([place])   . Wears glasses    Past Surgical History:  Procedure Laterality Date  . CARDIAC CATHETERIZATION  03-03-2000  dr gregg taylor   normal lvsf/  two-vessel cad significant complex stenosis at distal left main  . CARDIOVASCULAR STRESS TEST  08-09-2010  DR Carleene Overlie TAYLOR   normal lexiscan nuclear study/  no ischemia/  normal lvf/  ef 54%  . CATARACT EXTRACTION W/ INTRAOCULAR LENS IMPLANT Right 2011  . COLONOSCOPY  12-30-2009  . CORONARY ARTERY BYPASS GRAFT  03-06-2000   DR TGGYIRSW   third vessel  . PENILE PROSTHESIS IMPLANT  02-08-2010   COLOPLAST 3-PIECE INFLATABLE  . PENILE PROSTHESIS IMPLANT N/A 05/27/2013   Procedure: CYSTO REMOVAL OF PENILE PROSTHESIS;  Surgeon: Claybon Jabs, MD;  Location[facility];  Service: Urology;  Laterality: N/A;  . POLYPECTOMY  12-30-2009   +TA  . TOTAL HIP ARTHROPLASTY  01/24/2011   Procedure: TOTAL HIP ARTHROPLASTY;  Surgeon: Dione Plover [name];  Location: [facility];  Service: Orthopedics;  Laterality: Left;  . TOTAL HIP ARTHROPLASTY Right 01/30/2013   Procedure: RIGHT TOTAL HIP ARTHROPLASTY;  Surgeon: Gearlean Alf, MD;  Location: [facility];  Service: Orthopedics;  Laterality: Right;   Social History   Socioeconomic History  . Marital status: Married    Spouse name: Not on file  . Number of children: Not on file  . Years of education: Not on file  . Highest education level: Not on file  Occupational History  . Occupation: Engineer, building services: RETIRED  Tobacco Use  . Smoking status: Former Smoker    Types: Cigarettes    Quit date: 05/22/1970    Years since quitting: 50.0  . Smokeless tobacco: Never Used  Vaping Use  . Vaping  Use: Never used  Substance and Sexual Activity  . Alcohol use: No    Comment: seldom  . Drug use: No  . Sexual activity: Not Currently  Other Topics Concern  . Not on file  Social History Narrative   Regular Exercise-No         Social Determinants of Health   Financial Resource Strain: Low Risk   . Difficulty of Paying Living Expenses: Not hard at all  Food Insecurity: No Food Insecurity  . Worried About Charity fundraiser in the Last Year: Never true  . Ran Out of Food in the Last Year: Never true  Transportation Needs: No Transportation Needs  . Lack of Transportation (Medical): No  .  Lack of Transportation (Non-Medical): No  Physical Activity: Sufficiently Active  . Days of Exercise per Week: 5 days  . Minutes of Exercise per Session: 30 min  Stress: No Stress Concern Present  . Feeling of Stress : Not at all  Social Connections: Socially Integrated  . Frequency of Communication with Friends and Family: More than three times a week  . Frequency of Social Gatherings with Friends and Family: More than three times a week  . Attends Religious Services: More than 4 times per year  . Active Member of Clubs or Organizations: Yes  . Attends Archivist Meetings: More than 4 times per year  . Marital Status: Married   Family History  Problem Relation Age of Onset  . Hypertension Mother   . Diabetes Father   . Cancer - Other Brother        cancer all over  . Coronary artery disease Other        1st degree male relative  . Hypertension Other   . Cancer Sister 35       ovarian ca  . Colon cancer Neg Hx   . Stomach cancer Neg Hx   . Esophageal cancer Neg Hx   . Pancreatic cancer Neg Hx   . Liver disease Neg Hx     Review of Systems: Pertinent positive and negative review of systems were noted in the above HPI section. All other review of systems were otherwise negative.   Physical Exam: General: Well developed, well nourished, no acute distress Head:  Normocephalic and atraumatic Eyes: Sclerae anicteric, EOMI Ears: Normal auditory acuity Mouth: Not examined, mask on during Covid-19 pandemic Neck: Supple, no masses or thyromegaly Lungs: Clear throughout to auscultation Heart: Regular rate and rhythm; no murmurs, rubs or bruits Abdomen: Soft, non tender and non distended. No masses, hepatosplenomegaly or hernias noted. Normal Bowel sounds Rectal: Deferred to colonoscopy Musculoskeletal: Symmetrical with no gross deformities  Skin: No lesions on visible extremities Pulses:  Normal pulses noted Extremities: No clubbing, cyanosis, edema or deformities noted Neurological: Alert oriented x 4, grossly nonfocal Cervical Nodes:  No significant cervical adenopathy Inguinal Nodes: No significant inguinal adenopathy Psychological:  Alert and cooperative. Normal mood and affect   Assessment and Recommendations:  1. Personal history of adenomatous colon polyps.  Schedule colonoscopy. The risks (including bleeding, perforation, infection, missed lesions, medication reactions and possible hospitalization or surgery if complications occur), benefits, and alternatives to colonoscopy with possible biopsy and possible polypectomy were discussed with the patient and they consent to proceed.   2. GERD. Continue pantoprazole 40 mg po qd. Follow antireflux measures.   3. LLE DVT diagnosed in 11/2021on Xarelto.  A 62-month course of Xarelto will be completed later this month.  If he needs to remain on Xarelto at the time of his colonoscopy we will plan to old Xarelto to days before procedure - will instruct when and how to resume after procedure. Low but real risk of cardiovascular event such as heart attack, stroke, embolism, thrombosis or ischemia/infarct of other organs off Xarelto explained and need to seek urgent help if this occurs. The patient consents to proceed. Will communicate by phone or EMR with patient's prescribing provider to confirm that holding  Xarelto is reasonable in this case.     cc: Plotnikov, Evie Lacks, MD [address] [place],  [place] [address]

## 2020-06-12 NOTE — Telephone Encounter (Signed)
   DEQUON SCHNEBLY May 31, 1945 [id number]  Dear Dr. Alain Marion:  We have scheduled the above named patient for a(n) Colonoscopy procedure. Our records show that (s)he is on anticoagulation therapy.  Please advise as to whether the patient may come off their therapy of Xarelto 2 days prior to their procedure which is scheduled for 09/15/20.  Please route your response to Dorisann Frames, [place]  Sincerely,    [facility]

## 2020-06-12 NOTE — Patient Instructions (Signed)
You have been scheduled for a colonoscopy. Please follow written instructions given to you at your visit today.  Please pick up your prep supplies at the pharmacy within the next 1-3 days. If you use inhalers (even only as needed), please bring them with you on the day of your procedure.  Normal BMI (Body Mass Index- based on height and weight) is between 23 and 30. Your BMI today is Body mass index is 34.17 kg/m. [name] Please consider follow up  regarding your BMI with your Primary Care Provider.  Thank you for choosing me and [facility].  Pricilla Riffle. Dagoberto Ligas., MD., Marval Regal

## 2020-06-14 NOTE — Telephone Encounter (Signed)
Dear Estill Bamberg, Rev. Laskin can stop/restart his Xarelto per your colonoscopy protocol. Thank you, AP

## 2020-06-15 NOTE — Telephone Encounter (Signed)
Left message for patient to return my call.

## 2020-06-16 NOTE — Telephone Encounter (Signed)
Left message for patient to return my call.

## 2020-06-17 NOTE — Telephone Encounter (Signed)
Informed patient he can hold his Xarelto 2 days prior to his procedure. Patient verbalized understanding. 

## 2020-06-29 ENCOUNTER — Other Ambulatory Visit: Payer: Self-pay | Admitting: Endocrinology

## 2020-07-24 ENCOUNTER — Ambulatory Visit: Payer: Medicare HMO | Admitting: Endocrinology

## 2020-07-27 ENCOUNTER — Encounter: Payer: Self-pay | Admitting: Internal Medicine

## 2020-07-27 ENCOUNTER — Other Ambulatory Visit: Payer: Self-pay | Admitting: Endocrinology

## 2020-07-27 ENCOUNTER — Ambulatory Visit (INDEPENDENT_AMBULATORY_CARE_PROVIDER_SITE_OTHER): Payer: Medicare HMO | Admitting: Internal Medicine

## 2020-07-27 ENCOUNTER — Other Ambulatory Visit: Payer: Self-pay

## 2020-07-27 VITALS — BP 102/42 | HR 66 | Temp 97.5°F | Ht 69.0 in | Wt 228.8 lb

## 2020-07-27 DIAGNOSIS — E1159 Type 2 diabetes mellitus with other circulatory complications: Secondary | ICD-10-CM

## 2020-07-27 DIAGNOSIS — G5601 Carpal tunnel syndrome, right upper limb: Secondary | ICD-10-CM

## 2020-07-27 DIAGNOSIS — E781 Pure hyperglyceridemia: Secondary | ICD-10-CM | POA: Diagnosis not present

## 2020-07-27 DIAGNOSIS — M1A9XX1 Chronic gout, unspecified, with tophus (tophi): Secondary | ICD-10-CM | POA: Diagnosis not present

## 2020-07-27 DIAGNOSIS — I824Y2 Acute embolism and thrombosis of unspecified deep veins of left proximal lower extremity: Secondary | ICD-10-CM

## 2020-07-27 DIAGNOSIS — R202 Paresthesia of skin: Secondary | ICD-10-CM

## 2020-07-27 DIAGNOSIS — G5600 Carpal tunnel syndrome, unspecified upper limb: Secondary | ICD-10-CM | POA: Insufficient documentation

## 2020-07-27 LAB — VITAMIN B12: Vitamin B-12: 348 pg/mL (ref 211–911)

## 2020-07-27 LAB — HEMOGLOBIN A1C: Hgb A1c MFr Bld: 8 % — ABNORMAL HIGH (ref 4.6–6.5)

## 2020-07-27 MED ORDER — CYCLOSET 0.8 MG PO TABS: 2.4000 mg | ORAL_TABLET | Freq: Every day | ORAL | 3 refills | Status: DC

## 2020-07-27 MED ORDER — ASPIRIN 81 MG PO TBEC: 162.0000 mg | DELAYED_RELEASE_TABLET | Freq: Every day | ORAL | 3 refills | Status: DC

## 2020-07-27 NOTE — Assessment & Plan Note (Signed)
R - probable Ortho ref

## 2020-07-27 NOTE — Assessment & Plan Note (Signed)
R (L handed)  Ref to see Dr Amedeo Plenty

## 2020-07-27 NOTE — Progress Notes (Signed)
Subjective:  Patient ID: KARANVIR BALDERSTON, male    DOB: [birth year]  Age: 75 y.o. MRN: [id number]  CC: Follow-up (3 month f/u)   HPI Mylo Red Barabas presents for DM, HTN, DVT f/u Pt stopped Xarelto 3 wks ago. He just came back from [facility] (by car) C/o fingers on the R hand tinngle  Outpatient Medications Prior to Visit  Medication Sig Dispense Refill  . albuterol (VENTOLIN HFA) 108 (90 Base) MCG/ACT inhaler INHALE 2 PUFFS INTO THE LUNGS EVERY 4 HOURS AS NEEDED FOR WHEEZING OR SHORTNESS OF BREATH 18 g 5  . Alcohol Swabs (B-D SINGLE USE SWABS REGULAR) PADS 1 each by Does not apply route 2 (two) times daily as needed. Dx: 250.00 100 each 3  . allopurinol (ZYLOPRIM) 100 MG tablet Take 1 tablet (100 mg total) by mouth daily. 90 tablet 3  . aspirin EC 81 MG tablet Take 81 mg by mouth daily.    . Blood Glucose Calibration (ACCU-CHEK AVIVA) SOLN Use as directed as needed. (Patient taking differently: Check BID) 1 each 2  . Bromocriptine Mesylate (CYCLOSET) 0.8 MG TABS Take 3 tablets (2.4 mg total) by mouth daily. 270 tablet 3  . cholecalciferol (VITAMIN D) 1000 UNITS tablet Take 1,000 Units by mouth every morning.    . colchicine 0.6 MG tablet Take 1 tablet (0.6 mg total) by mouth daily. 90 tablet 3  . finasteride (PROSCAR) 5 MG tablet Take 1 tablet (5 mg total) by mouth every morning. 90 tablet 3  . fluticasone furoate-vilanterol (BREO ELLIPTA) 100-25 MCG/INH AEPB Inhale 1 puff into the lungs daily. 1 each 5  . irbesartan-hydrochlorothiazide (AVALIDE) 300-12.5 MG tablet Take 1 tablet by mouth daily. 90 tablet 3  . Lancet Devices (ACCU-CHEK SOFTCLIX) lancets Use twice daily as instructed. Dx: 250.00 1 each 3  . Lancets (ONETOUCH DELICA PLUS [id number]) MISC USE TWICE DAILY AS DIRECTED 200 each 3  . lovastatin (MEVACOR) 40 MG tablet Take 1 tablet (40 mg total) by mouth every morning. 90 tablet 3  . metFORMIN (GLUCOPHAGE) 500 MG tablet Take 1 tablet (500 mg total) by mouth daily with breakfast. 90  tablet 3  . Multiple Vitamins-Minerals (MULTI-VITAMIN GUMMIES PO) Take 1 tablet by mouth daily.    . nitroGLYCERIN (NITROSTAT) 0.4 MG SL tablet Place 1 tablet (0.4 mg total) under the tongue every 5 (five) minutes as needed for chest pain. 25 tablet 3  . ONETOUCH ULTRA test strip USE TWICE DAILY AS  INSTRUCTED 200 strip 3  . pantoprazole (PROTONIX) 40 MG tablet Take 1 tablet (40 mg total) by mouth every morning. 90 tablet 3  . repaglinide (PRANDIN) 1 MG tablet Take 1 tablet (1 mg total) by mouth 3 (three) times daily before meals. 270 tablet 3  . rivaroxaban (XARELTO) 20 MG TABS tablet Take 1 tablet (20 mg total) by mouth daily. 30 tablet 5  . tamsulosin (FLOMAX) 0.4 MG CAPS capsule Take 1 capsule (0.4 mg total) by mouth daily. 90 capsule 3  . acarbose (PRECOSE) 25 MG tablet Take 1 tablet (25 mg total) by mouth 3 (three) times daily with meals. (Patient not taking: No sig reported) 270 tablet 3   No facility-administered medications prior to visit.    ROS: Review of Systems  Constitutional: Negative for appetite change, fatigue and unexpected weight change.  HENT: Negative for congestion, nosebleeds, sneezing, sore throat and trouble swallowing.   Eyes: Negative for itching and visual disturbance.  Respiratory: Negative for cough.   Cardiovascular: Negative for chest pain,  palpitations and leg swelling.  Gastrointestinal: Negative for abdominal distention, blood in stool, diarrhea and nausea.  Genitourinary: Negative for frequency and hematuria.  Musculoskeletal: Positive for arthralgias. Negative for back pain, gait problem, joint swelling and neck pain.  Skin: Negative for rash.  Neurological: Negative for dizziness, tremors, speech difficulty and weakness.  Psychiatric/Behavioral: Negative for agitation, dysphoric mood and sleep disturbance. The patient is not nervous/anxious.     Objective:  BP (!) 102/42 (BP Location: Left Arm)   Pulse 66   Temp (!) 97.5 F (36.4 C) (Oral)   Ht  5\' 9"  (1.753 m)   Wt 228 lb 12.8 oz (103.8 kg)   SpO2 97%   BMI 33.79 kg/m   BP Readings from Last 3 Encounters:  07/27/20 (!) 102/42  06/12/20 140/70  05/04/20 (!) 152/70    Wt Readings from Last 3 Encounters:  07/27/20 228 lb 12.8 oz (103.8 kg)  06/12/20 231 lb 6.4 oz (105 kg)  05/04/20 231 lb 9.6 oz (105.1 kg)    Physical Exam Constitutional:      General: He is not in acute distress.    Appearance: He is well-developed. He is obese.     Comments: NAD  Eyes:     Conjunctiva/sclera: Conjunctivae normal.     Pupils: Pupils are equal, round, and reactive to light.  Neck:     Thyroid: No thyromegaly.     Vascular: No JVD.  Cardiovascular:     Rate and Rhythm: Normal rate and regular rhythm.     Heart sounds: Normal heart sounds. No murmur heard. No friction rub. No gallop.   Pulmonary:     Effort: Pulmonary effort is normal. No respiratory distress.     Breath sounds: Normal breath sounds. No wheezing or rales.  Chest:     Chest Shaft: No tenderness.  Abdominal:     General: Bowel sounds are normal. There is no distension.     Palpations: Abdomen is soft. There is no mass.     Tenderness: There is no abdominal tenderness. There is no guarding or rebound.  Musculoskeletal:        General: No tenderness. Normal range of motion.     Cervical back: Normal range of motion.  Lymphadenopathy:     Cervical: No cervical adenopathy.  Skin:    General: Skin is warm and dry.     Findings: No rash.  Neurological:     Mental Status: He is alert and oriented to person, place, and time.     Cranial Nerves: No cranial nerve deficit.     Motor: No abnormal muscle tone.     Coordination: Coordination normal.     Gait: Gait normal.     Deep Tendon Reflexes: Reflexes are normal and symmetric.  Psychiatric:        Behavior: Behavior normal.        Thought Content: Thought content normal.        Judgment: Judgment normal.    L elbow tophi/bursitis   Lab Results  Component  Value Date   WBC 7.1 12/16/2019   HGB 12.4 (L) 12/16/2019   HCT 36.5 (L) 12/16/2019   PLT 230.0 12/16/2019   GLUCOSE 219 (H) 02/04/2020   CHOL 167 09/09/2016   TRIG 371.0 (H) 09/09/2016   HDL 31.40 (L) 09/09/2016   LDLDIRECT 73.0 09/09/2016   LDLCALC 74 11/29/2013   ALT 14 05/07/2018   AST 10 05/07/2018   NA 137 02/04/2020   K 4.0 02/04/2020  CL 101 02/04/2020   CREATININE 1.34 02/04/2020   BUN 16 02/04/2020   CO2 30 02/04/2020   TSH 3.59 10/14/2010   PSA 1.0 11/14/2019   INR 1.15 01/21/2013   HGBA1C 8.7 (A) 04/24/2020   MICROALBUR <0.7 09/09/2016    VAS [place] LOWER EXTREMITY VENOUS (DVT)  Result Date: 12/17/2019  Lower Venous DVTStudy Indications: Right calf pain/ tightness.  Comparison Study: none Performing Technologist: June Leap RDMS, RVT  Examination Guidelines: A complete evaluation includes B-mode imaging, spectral Doppler, color Doppler, and power Doppler as needed of all accessible portions of each vessel. Bilateral testing is considered an integral part of a complete examination. Limited examinations for reoccurring indications may be performed as noted. The reflux portion of the exam is performed with the patient in reverse Trendelenburg.  +-----+---------------+---------+-----------+----------+--------------+ RIGHTCompressibilityPhasicitySpontaneityPropertiesThrombus Aging +-----+---------------+---------+-----------+----------+--------------+ CFV  Full           Yes      Yes                                 +-----+---------------+---------+-----------+----------+--------------+   +---------+---------------+---------+-----------+----------+--------------+ LEFT     CompressibilityPhasicitySpontaneityPropertiesThrombus Aging +---------+---------------+---------+-----------+----------+--------------+ CFV      Full           Yes      Yes                                 +---------+---------------+---------+-----------+----------+--------------+ SFJ       Full                                                        +---------+---------------+---------+-----------+----------+--------------+ FV Prox  Full                                                        +---------+---------------+---------+-----------+----------+--------------+ FV Mid   Partial        Yes      Yes                  Acute          +---------+---------------+---------+-----------+----------+--------------+ FV DistalNone           No       No                   Acute          +---------+---------------+---------+-----------+----------+--------------+ PFV      Full                                                        +---------+---------------+---------+-----------+----------+--------------+ POP      None           No       No                   Acute          +---------+---------------+---------+-----------+----------+--------------+ PTV  None                                         Acute          +---------+---------------+---------+-----------+----------+--------------+ PERO     None                                         Acute          +---------+---------------+---------+-----------+----------+--------------+ Gastroc  None                                         Acute          +---------+---------------+---------+-----------+----------+--------------+ GSV      Full                                                        +---------+---------------+---------+-----------+----------+--------------+    Findings reported to [facility] @ Dr. Ronnald Ramp' office at 3:18 pm.  Summary: RIGHT: - No evidence of common femoral vein obstruction.  LEFT: - Findings consistent with acute deep vein thrombosis involving the left femoral vein, left popliteal vein, left posterior tibial veins, left peroneal veins, and left gastrocnemius veins.  *See table(s) above for measurements and observations. Electronically signed by Monica Martinez MD on 12/17/2019  at 4:40:56 PM.    Final     Assessment & Plan:   There are no diagnoses linked to this encounter.   No orders of the defined types were placed in this encounter.    Follow-up: No follow-ups on file.  Walker Kehr, MD

## 2020-07-27 NOTE — Patient Instructions (Signed)
Carpal Tunnel Syndrome  Carpal tunnel syndrome is a condition that causes pain, weakness, and numbness in your hand and arm. Numbness is when you cannot feel an area in your body. The carpal tunnel is a narrow area that is on the palm side of your wrist. Repeated wrist motion or certain diseases may cause swelling in the tunnel. This swelling can pinch the main nerve in the wrist. This nerve is called the median nerve. What are the causes? This condition may be caused by:  Moving your hand and wrist over and over again while doing a task.  Injury to the wrist.  Arthritis.  A sac of fluid (cyst) or abnormal growth (tumor) in the carpal tunnel.  Fluid buildup during pregnancy.  Use of tools that vibrate. Sometimes the cause is not known. What increases the risk? The following factors may make you more likely to have this condition:  Having a job that makes you do these things: ? Move your hand over and over again. ? Work with tools that vibrate, such as drills or sanders.  Being a woman.  Having diabetes, obesity, thyroid problems, or kidney failure. What are the signs or symptoms? Symptoms of this condition include:  A tingling feeling in your fingers.  Tingling or loss of feeling in your hand.  Pain in your entire arm. This pain may get worse when you bend your wrist and elbow for a long time.  Pain in your wrist that goes up your arm to your shoulder.  Pain that goes down into your palm or fingers.  Weakness in your hands. You may find it hard to grab and hold items. You may feel worse at night. How is this treated? This condition may be treated with:  Lifestyle changes. You will be asked to stop or change the activity that caused your problem.  Doing exercises and activities that make bones, muscles, and tendons stronger (physical therapy).  Learning how to use your hand again (occupational therapy).  Medicines for pain and swelling. You may have injections in  your wrist.  A wrist splint or brace.  Surgery. Follow these instructions at home: If you have a splint or brace:  Wear the splint or brace as told by your doctor. Take it off only as told by your doctor.  Loosen the splint if your fingers: ? Tingle. ? Become numb. ? Turn cold and blue.  Keep the splint or brace clean.  If the splint or brace is not waterproof: ? Do not let it get wet. ? Cover it with a watertight covering when you take a bath or a shower. Managing pain, stiffness, and swelling If told, put ice on the painful area:  If you have a removable splint or brace, remove it as told by your doctor.  Put ice in a plastic bag.  Place a towel between your skin and the bag.  Leave the ice on for 20 minutes, 2-3 times per day. Do not fall asleep with the cold pack on your skin.  Take off the ice if your skin turns bright red. This is very important. If you cannot feel pain, heat, or cold, you have a greater risk of damage to the area. Move your fingers often to reduce stiffness and swelling.   General instructions  Take over-the-counter and prescription medicines only as told by your doctor.  Rest your wrist from any activity that may cause pain. If needed, talk with your boss at work about changes that can   help your wrist heal.  Do exercises as told by your doctor, physical therapist, or occupational therapist.  Keep all follow-up visits. Contact a doctor if:  You have new symptoms.  Medicine does not help your pain.  Your symptoms get worse. Get help right away if:  You have very bad numbness or tingling in your wrist or hand. Summary  Carpal tunnel syndrome is a condition that causes pain in your hand and arm.  It is often caused by repeated wrist motions.  Lifestyle changes and medicines are used to treat this problem. Surgery may help in very bad cases.  Follow your doctor's instructions about wearing a splint, resting your wrist, keeping follow-up  visits, and calling for help. This information is not intended to replace advice given to you by your health care provider. Make sure you discuss any questions you have with your health care provider. Document Revised: 07/04/2019 Document Reviewed: 07/04/2019 Elsevier Patient Education  2021 Elsevier Inc.  

## 2020-07-27 NOTE — Assessment & Plan Note (Signed)
A1c

## 2020-07-27 NOTE — Assessment & Plan Note (Signed)
D/c Xarelto. Pt stopped in 07/2020 - it was $400/month Take ASA 81 mg two a day

## 2020-07-27 NOTE — Assessment & Plan Note (Signed)
  Metformin, Prandin, Bromocriptine; Irbesartan HCT and Lovastatin

## 2020-07-28 LAB — COMPREHENSIVE METABOLIC PANEL
ALT: 12 U/L (ref 0–53)
AST: 13 U/L (ref 0–37)
Albumin: 4 g/dL (ref 3.5–5.2)
Alkaline Phosphatase: 84 U/L (ref 39–117)
BUN: 29 mg/dL — ABNORMAL HIGH (ref 6–23)
CO2: 24 mEq/L (ref 19–32)
Calcium: 9.5 mg/dL (ref 8.4–10.5)
Chloride: 105 mEq/L (ref 96–112)
Creatinine, Ser: 1.49 mg/dL (ref 0.40–1.50)
GFR: 45.54 mL/min — ABNORMAL LOW (ref >60.00)
Glucose, Bld: 79 mg/dL (ref 70–99)
Potassium: 4.5 mEq/L (ref 3.5–5.1)
Sodium: 139 mEq/L (ref 135–145)
Total Bilirubin: 0.4 mg/dL (ref 0.2–1.2)
Total Protein: 7 g/dL (ref 6.0–8.3)

## 2020-07-28 LAB — URIC ACID: Uric Acid, Serum: 7.6 mg/dL (ref 4.0–7.8)

## 2020-07-28 LAB — LIPID PANEL
Cholesterol: 139 mg/dL (ref 0–200)
HDL: 28.9 mg/dL — ABNORMAL LOW (ref >39.00)
NonHDL: 109.98
Total CHOL/HDL Ratio: 5
Triglycerides: 206 mg/dL — ABNORMAL HIGH (ref 0.0–149.0)
VLDL: 41.2 mg/dL — ABNORMAL HIGH (ref 0.0–40.0)

## 2020-07-28 LAB — LDL CHOLESTEROL, DIRECT: Direct LDL: 83 mg/dL

## 2020-08-17 DIAGNOSIS — H524 Presbyopia: Secondary | ICD-10-CM | POA: Diagnosis not present

## 2020-08-17 DIAGNOSIS — H52223 Regular astigmatism, bilateral: Secondary | ICD-10-CM | POA: Diagnosis not present

## 2020-08-26 ENCOUNTER — Other Ambulatory Visit: Payer: Self-pay

## 2020-08-26 ENCOUNTER — Ambulatory Visit (INDEPENDENT_AMBULATORY_CARE_PROVIDER_SITE_OTHER): Payer: Medicare HMO | Admitting: Endocrinology

## 2020-08-26 VITALS — BP 120/58 | HR 68 | Ht 71.0 in | Wt 229.4 lb

## 2020-08-26 DIAGNOSIS — E1159 Type 2 diabetes mellitus with other circulatory complications: Secondary | ICD-10-CM

## 2020-08-26 LAB — POCT GLYCOSYLATED HEMOGLOBIN (HGB A1C): Hemoglobin A1C: 7.6 % — AB (ref 4.0–5.6)

## 2020-08-26 MED ORDER — REPAGLINIDE 2 MG PO TABS: 2.0000 mg | ORAL_TABLET | Freq: Two times a day (BID) | ORAL | 3 refills | Status: DC

## 2020-08-26 NOTE — Patient Instructions (Addendum)
I have sent a prescription to your pharmacy, to increase the repaglinide to 2 mg with breakfast, and with supper.  To use up your current 1 mg pills, please take 2 pills at a time Please continue the same other diabetes medications.   check your blood sugar once a day.  vary the time of day when you check, between before the 3 meals, and at bedtime.  also check if you have symptoms of your blood sugar being too high or too low.  please keep a record of the readings and bring it to your next appointment here (or you can bring the meter itself).  You can write it on any piece of paper.  please call us sooner if your blood sugar goes below 70, or if you have a lot of readings over 200.   Please come back for a follow-up appointment in 4 months.

## 2020-08-26 NOTE — Progress Notes (Signed)
Subjective:    Patient ID: Johnny [name], male    DOB: Sep 26, 1945, 75 y.o.   MRN: [id number]  HPI Pt returns for f/u of diabetes mellitus:  DM type: 2 Dx'ed: 0623 Complications: CAD, PN, and CRI.   Therapy: 3 oral meds.  DKA: never Severe hypoglycemia: never Pancreatitis: never Pancreatic imaging: never SDOH: he cannot afford name brand meds.  Other: he has never been on insulin, except in the hospital, but he has learned how to take.  edema and CRI limit rx options and dosages.   Interval history: pt states he feels well in general.  He says cbg's are in the 100's.  he says he never misses meds.  He wants to stay off acarbose.   Past Medical History:  Diagnosis Date   Arthritis    At risk for sleep apnea    STOP-BANG=  6   SENT TO PCP 05-21-2013   Benign positional vertigo    Benign prostatic hypertrophy    Blood transfusion without reported diagnosis 2000   CAD (coronary artery disease)    CARDIOLOGIST--  DR Cristopher Peru   CHF (congestive heart failure) (HCC)    Chronic ischemic heart disease, unspecified    ED (erectile dysfunction) of organic origin    GERD (gastroesophageal reflux disease)    History of CHF (congestive heart failure)    systolic   HTN (hypertension)    Hyperlipidemia    Tubular adenoma of colon 12/2009   Type 2 diabetes mellitus ([place])    Wears glasses     Past Surgical History:  Procedure Laterality Date   CARDIAC CATHETERIZATION  03-03-2000  dr Carleene Overlie taylor   normal lvsf/  two-vessel cad significant complex stenosis at distal left main   CARDIOVASCULAR STRESS TEST  08-09-2010  DR Carleene Overlie TAYLOR   normal lexiscan nuclear study/  no ischemia/  normal lvf/  ef 54%   CATARACT EXTRACTION W/ INTRAOCULAR LENS IMPLANT Right 2011   COLONOSCOPY  12-30-2009   CORONARY ARTERY BYPASS GRAFT  03-06-2000   DR GERHARDT   third vessel   PENILE PROSTHESIS IMPLANT  02-08-2010   COLOPLAST 3-PIECE INFLATABLE   PENILE PROSTHESIS IMPLANT N/A 05/27/2013    Procedure: CYSTO REMOVAL OF PENILE PROSTHESIS;  Surgeon: Claybon Jabs, MD;  Location: [facility];  Service: Urology;  Laterality: N/A;   POLYPECTOMY  12-30-2009   +TA   TOTAL HIP ARTHROPLASTY  01/24/2011   Procedure: TOTAL HIP ARTHROPLASTY;  Surgeon: Dione Plover [name];  Location: [facility];  Service: Orthopedics;  Laterality: Left;   TOTAL HIP ARTHROPLASTY Right 01/30/2013   Procedure: RIGHT TOTAL HIP ARTHROPLASTY;  Surgeon: Gearlean Alf, MD;  Location: [facility];  Service: Orthopedics;  Laterality: Right;    Social History   Socioeconomic History   Marital status: Married    Spouse name: Not on file   Number of children: Not on file   Years of education: Not on file   Highest education level: Not on file  Occupational History   Occupation: Preacher    Employer: RETIRED  Tobacco Use   Smoking status: Former    Pack years: 0.00    Types: Cigarettes    Quit date: 05/22/1970    Years since quitting: 50.3   Smokeless tobacco: Never  Vaping Use   Vaping Use: Never used  Substance and Sexual Activity   Alcohol use: No    Comment: seldom   Drug use: No   Sexual activity: Not Currently  Other  Topics Concern   Not on file  Social History Narrative   Regular Exercise-No         Social Determinants of Health   Financial Resource Strain: Low Risk    Difficulty of Paying Living Expenses: Not hard at all  Food Insecurity: No Food Insecurity   Worried About Charity fundraiser in the Last Year: Never true   Arboriculturist in the Last Year: Never true  Transportation Needs: No Transportation Needs   Lack of Transportation (Medical): No   Lack of Transportation (Non-Medical): No  Physical Activity: Sufficiently Active   Days of Exercise per Week: 5 days   Minutes of Exercise per Session: 30 min  Stress: No Stress Concern Present   Feeling of Stress : Not at all  Social Connections: Socially Integrated   Frequency of Communication with Friends and Family: More than  three times a week   Frequency of Social Gatherings with Friends and Family: More than three times a week   Attends Religious Services: More than 4 times per year   Active Member of [employer] or Organizations: Yes   Attends Music therapist: More than 4 times per year   Marital Status: Married  Human resources officer Violence: Not on file    Current Outpatient Medications on File Prior to Visit  Medication Sig Dispense Refill   albuterol (VENTOLIN HFA) 108 (90 Base) MCG/ACT inhaler INHALE 2 PUFFS INTO THE LUNGS EVERY 4 HOURS AS NEEDED FOR WHEEZING OR SHORTNESS OF BREATH 18 g 5   Alcohol Swabs (B-D SINGLE USE SWABS REGULAR) PADS 1 each by Does not apply route 2 (two) times daily as needed. Dx: 250.00 100 each 3   allopurinol (ZYLOPRIM) 100 MG tablet Take 1 tablet (100 mg total) by mouth daily. 90 tablet 3   aspirin 81 MG EC tablet Take 2 tablets (162 mg total) by mouth daily. Swallow whole. 100 tablet 3   Blood Glucose Calibration (ACCU-CHEK AVIVA) SOLN Use as directed as needed. (Patient taking differently: Check BID) 1 each 2   Bromocriptine Mesylate (CYCLOSET) 0.8 MG TABS Take 3 tablets (2.4 mg total) by mouth daily. 270 tablet 3   cholecalciferol (VITAMIN D) 1000 UNITS tablet Take 1,000 Units by mouth every morning.     colchicine 0.6 MG tablet Take 1 tablet (0.6 mg total) by mouth daily. 90 tablet 3   finasteride (PROSCAR) 5 MG tablet Take 1 tablet (5 mg total) by mouth every morning. 90 tablet 3   fluticasone furoate-vilanterol (BREO ELLIPTA) 100-25 MCG/INH AEPB Inhale 1 puff into the lungs daily. 1 each 5   irbesartan-hydrochlorothiazide (AVALIDE) 300-12.5 MG tablet Take 1 tablet by mouth daily. 90 tablet 3   Lancet Devices (ACCU-CHEK SOFTCLIX) lancets Use twice daily as instructed. Dx: 250.00 1 each 3   Lancets (ONETOUCH DELICA PLUS [id number]) MISC USE TWICE DAILY AS DIRECTED 200 each 3   lovastatin (MEVACOR) 40 MG tablet Take 1 tablet (40 mg total) by mouth every morning. 90 tablet  3   metFORMIN (GLUCOPHAGE) 500 MG tablet Take 1 tablet (500 mg total) by mouth daily with breakfast. 90 tablet 3   Multiple Vitamins-Minerals (MULTI-VITAMIN GUMMIES PO) Take 1 tablet by mouth daily.     nitroGLYCERIN (NITROSTAT) 0.4 MG SL tablet Place 1 tablet (0.4 mg total) under the tongue every 5 (five) minutes as needed for chest pain. 25 tablet 3   ONETOUCH ULTRA test strip USE TWICE DAILY AS  INSTRUCTED 200 strip 3   pantoprazole (  PROTONIX) 40 MG tablet Take 1 tablet (40 mg total) by mouth every morning. 90 tablet 3   tamsulosin (FLOMAX) 0.4 MG CAPS capsule Take 1 capsule (0.4 mg total) by mouth daily. 90 capsule 3   No current facility-administered medications on file prior to visit.    Allergies  Allergen Reactions   Enalapril     SOB   Hydrocodone-Acetaminophen Nausea Only    Can take codein    Family History  Problem Relation Age of Onset   Hypertension Mother    Diabetes Father    Cancer - Other Brother        cancer all over   Coronary artery disease Other        1st degree male relative   Hypertension Other    Cancer Sister 69       ovarian ca   Colon cancer Neg Hx    Stomach cancer Neg Hx    Esophageal cancer Neg Hx    Pancreatic cancer Neg Hx    Liver disease Neg Hx     BP (!) 120/58   Pulse 68   Ht 5\' 11"  (1.803 m)   Wt 229 lb 6.4 oz (104.1 kg)   SpO2 98%   BMI 31.99 kg/m    Review of Systems He denies hypoglycemia    Objective:   Physical Exam Pulses: dorsalis pedis intact bilat.   MSK: no deformity of the feet CV: 1+ bilat leg edema Skin:  no ulcer on the feet.  normal color and temp on the feet. Neuro: sensation is intact to touch on the feet.  Ext: there is bilateral onychomycosis of the toenails.     Lab Results  Component Value Date   HGBA1C 7.6 (A) 08/26/2020       Assessment & Plan:  Type 2 DM: uncontrolled.    Patient Instructions  I have sent a prescription to your pharmacy, to increase the repaglinide to 2 mg with  breakfast, and with supper.  To use up your current 1 mg pills, please take 2 pills at a time Please continue the same other diabetes medications.   check your blood sugar once a day.  vary the time of day when you check, between before the 3 meals, and at bedtime.  also check if you have symptoms of your blood sugar being too high or too low.  please keep a record of the readings and bring it to your next appointment here (or you can bring the meter itself).  You can write it on any piece of paper.  please call us sooner if your blood sugar goes below 70, or if you have a lot of readings over 200.   Please come back for a follow-up appointment in 4 months.

## 2020-09-02 DIAGNOSIS — M7021 Olecranon bursitis, right elbow: Secondary | ICD-10-CM | POA: Diagnosis not present

## 2020-09-02 DIAGNOSIS — M25521 Pain in right elbow: Secondary | ICD-10-CM | POA: Diagnosis not present

## 2020-09-02 DIAGNOSIS — M25531 Pain in right wrist: Secondary | ICD-10-CM | POA: Diagnosis not present

## 2020-09-02 DIAGNOSIS — G5601 Carpal tunnel syndrome, right upper limb: Secondary | ICD-10-CM | POA: Diagnosis not present

## 2020-09-02 DIAGNOSIS — E119 Type 2 diabetes mellitus without complications: Secondary | ICD-10-CM | POA: Diagnosis not present

## 2020-09-11 ENCOUNTER — Ambulatory Visit: Payer: Medicare HMO | Admitting: Endocrinology

## 2020-09-15 ENCOUNTER — Encounter: Payer: Medicare HMO | Admitting: Gastroenterology

## 2020-09-23 DIAGNOSIS — G5603 Carpal tunnel syndrome, bilateral upper limbs: Secondary | ICD-10-CM | POA: Diagnosis not present

## 2020-09-28 ENCOUNTER — Encounter: Payer: Medicare HMO | Admitting: Gastroenterology

## 2020-10-09 ENCOUNTER — Ambulatory Visit (AMBULATORY_SURGERY_CENTER): Payer: Medicare HMO | Admitting: Gastroenterology

## 2020-10-09 ENCOUNTER — Other Ambulatory Visit: Payer: Self-pay

## 2020-10-09 ENCOUNTER — Encounter: Payer: Self-pay | Admitting: Gastroenterology

## 2020-10-09 VITALS — BP 116/59 | HR 52 | Temp 97.3°F | Resp 15 | Ht 69.0 in | Wt 231.0 lb

## 2020-10-09 DIAGNOSIS — Z86010 Personal history of colonic polyps: Secondary | ICD-10-CM

## 2020-10-09 HISTORY — PX: COLONOSCOPY: SHX174

## 2020-10-09 MED ORDER — SODIUM CHLORIDE 0.9 % IV SOLN: 500.0000 mL | Freq: Once | INTRAVENOUS | Status: DC

## 2020-10-09 NOTE — Patient Instructions (Signed)
Impression/Recommendations:  Hemorrhoid, high fiber diet, and diverticulosis handouts given to patient.  Continue present medications.  No repeat colonoscopy due to age and absence of colonic polyps.  YOU HAD AN ENDOSCOPIC PROCEDURE TODAY AT [facility]:   Refer to the procedure report that was given to you for any specific questions about what was found during the examination.  If the procedure report does not answer your questions, please call your gastroenterologist to clarify.  If you requested that your care partner not be given the details of your procedure findings, then the procedure report has been included in a sealed envelope for you to review at your convenience later.  YOU SHOULD EXPECT: Some feelings of bloating in the abdomen. Passage of more gas than usual.  Walking can help get rid of the air that was put into your GI tract during the procedure and reduce the bloating. If you had a lower endoscopy (such as a colonoscopy or flexible sigmoidoscopy) you may notice spotting of blood in your stool or on the toilet paper. If you underwent a bowel prep for your procedure, you may not have a normal bowel movement for a few days.  Please Note:  You might notice some irritation and congestion in your nose or some drainage.  This is from the oxygen used during your procedure.  There is no need for concern and it should clear up in a day or so.  SYMPTOMS TO REPORT IMMEDIATELY:  Following lower endoscopy (colonoscopy or flexible sigmoidoscopy):  Excessive amounts of blood in the stool  Significant tenderness or worsening of abdominal pains  Swelling of the abdomen that is new, acute  Fever of 100F or higher For urgent or emergent issues, a gastroenterologist can be reached at any hour by calling [phone]. Do not use MyChart messaging for urgent concerns.    DIET:  We do recommend a small meal at first, but then you may proceed to your regular diet.  Drink plenty  of fluids but you should avoid alcoholic beverages for 24 hours.  ACTIVITY:  You should plan to take it easy for the rest of today and you should NOT DRIVE or use heavy machinery until tomorrow (because of the sedation medicines used during the test).    FOLLOW UP: Our staff will call the number listed on your records 48-72 hours following your procedure to check on you and address any questions or concerns that you may have regarding the information given to you following your procedure. If we do not reach you, we will leave a message.  We will attempt to reach you two times.  During this call, we will ask if you have developed any symptoms of COVID 19. If you develop any symptoms (ie: fever, flu-like symptoms, shortness of breath, cough etc.) before then, please call [phone].  If you test positive for Covid 19 in the 2 weeks post procedure, please call and report this information to [place].    If any biopsies were taken you will be contacted by phone or by letter within the next 1-3 weeks.  Please call us at [phone] if you have not heard about the biopsies in 3 weeks.    SIGNATURES/CONFIDENTIALITY: You and/or your care partner have signed paperwork which will be entered into your electronic medical record.  These signatures attest to the fact that that the information above on your After Visit Summary has been reviewed and is understood.  Full responsibility of the confidentiality of this  discharge information lies with you and/or your care-partner.  

## 2020-10-09 NOTE — Op Note (Addendum)
[facility] Patient Name: Johnny [name] Procedure Date: 10/09/2020 8:04 AM MRN: [id number] Endoscopist: Ladene Artist , MD Age: 75 Referring MD:  Date of Birth: Aug 13, 1945 Gender: Male Account #: [id number] Procedure:                Colonoscopy Indications:              Surveillance: Personal history of adenomatous                            polyps on last colonoscopy > 5 years ago Medicines:                Monitored Anesthesia Care Procedure:                Pre-Anesthesia Assessment:                           - Prior to the procedure, a History and Physical                            was performed, and patient medications and                            allergies were reviewed. The patient's tolerance of                            previous anesthesia was also reviewed. The risks                            and benefits of the procedure and the sedation                            options and risks were discussed with the patient.                            All questions were answered, and informed consent                            was obtained. Prior Anticoagulants: The patient has                            taken no previous anticoagulant or antiplatelet                            agents. ASA Grade Assessment: II - A patient with                            mild systemic disease. After reviewing the risks                            and benefits, the patient was deemed in                            satisfactory condition to undergo the procedure.  After obtaining informed consent, the colonoscope                            was passed under direct vision. Throughout the                            procedure, the patient's blood pressure, pulse, and                            oxygen saturations were monitored continuously. The                            CF [id number] was introduced through the anus                            and advanced to the the  cecum, identified by                            appendiceal orifice and ileocecal valve. The                            ileocecal valve, appendiceal orifice, and rectum                            were photographed. The quality of the bowel                            preparation was adequate after extensive lavage and                            suction. The colonoscopy was performed without                            difficulty. The patient tolerated the procedure                            well. Scope In: 8:04:41 AM Scope Out: 8:18:55 AM Scope Withdrawal Time: 0 hours 11 minutes 17 seconds  Total Procedure Duration: 0 hours 14 minutes 14 seconds  Findings:                 The perianal and digital rectal examinations were                            normal.                           Multiple medium-mouthed diverticula were found in                            the entire colon. There was no evidence of                            diverticular bleeding.  Internal hemorrhoids were found during                            retroflexion. The hemorrhoids were moderate and                            Grade I (internal hemorrhoids that do not prolapse).                           The exam was otherwise without abnormality on                            direct and retroflexion views. Complications:            No immediate complications. Estimated blood loss:                            None. Estimated Blood Loss:     Estimated blood loss: none. Impression:               - Moderate diverticulosis in the entire examined                            colon.                           - Internal hemorrhoids.                           - The examination was otherwise normal on direct                            and retroflexion views.                           - No specimens collected. Recommendation:           - Patient has a contact number available for                             emergencies. The signs and symptoms of potential                            delayed complications were discussed with the                            patient. Return to normal activities tomorrow.                            Written discharge instructions were provided to the                            patient.                           - High fiber diet.                           -  Continue present medications.                           - No repeat colonoscopy due to age and the absence                            of colonic polyps. Ladene Artist, MD 10/09/2020 8:24:13 AM This report has been signed electronically.

## 2020-10-09 NOTE — Progress Notes (Signed)
To PACU, VSS. Report to [email or web address].tb 

## 2020-10-09 NOTE — Progress Notes (Signed)
Pt's states no medical or surgical changes since previsit or office visit.   Vitals NS

## 2020-10-13 ENCOUNTER — Telehealth: Payer: Self-pay

## 2020-10-13 NOTE — Telephone Encounter (Signed)
  Follow up Call-  Call back number 10/09/2020  Post procedure Call Back phone  # [phone]  Permission to leave phone message Yes  Some recent data might be hidden     Patient questions:  Do you have a fever, pain , or abdominal swelling? No. Pain Score  0 *  Have you tolerated food without any problems? Yes.    Have you been able to return to your normal activities? Yes.    Do you have any questions about your discharge instructions: Diet   No. Medications  No. Follow up visit  No.  Do you have questions or concerns about your Care? No.  Actions: * If pain score is 4 or above: No action needed, pain <4.  Have you developed a fever since your procedure? no  2.   Have you had an respiratory symptoms (SOB or cough) since your procedure? no  3.   Have you tested positive for COVID 19 since your procedure no  4.   Have you had any family members/close contacts diagnosed with the COVID 19 since your procedure?  no   If yes to any of these questions please route to Joylene John, RN and Joella Prince, RN

## 2020-10-27 ENCOUNTER — Other Ambulatory Visit: Payer: Self-pay | Admitting: Endocrinology

## 2020-10-28 ENCOUNTER — Other Ambulatory Visit: Payer: Self-pay | Admitting: Endocrinology

## 2020-10-29 ENCOUNTER — Other Ambulatory Visit: Payer: Self-pay | Admitting: Endocrinology

## 2020-10-30 ENCOUNTER — Other Ambulatory Visit: Payer: Self-pay | Admitting: Endocrinology

## 2020-11-02 ENCOUNTER — Other Ambulatory Visit: Payer: Self-pay

## 2020-11-02 ENCOUNTER — Telehealth: Payer: Self-pay | Admitting: Endocrinology

## 2020-11-02 DIAGNOSIS — E1159 Type 2 diabetes mellitus with other circulatory complications: Secondary | ICD-10-CM

## 2020-11-02 MED ORDER — CYCLOSET 0.8 MG PO TABS
2.4000 mg | ORAL_TABLET | Freq: Every day | ORAL | 3 refills | Status: DC
Start: 2020-11-02 — End: 2020-11-05

## 2020-11-02 NOTE — Telephone Encounter (Signed)
Spoke with pt to let hin know that I sent the Rx to the pharmacy. I am not sure if it needed PA or not so I sent it the the PA team to run it to see if on e is needed and also advised pt to check with his pharmacy to see if one was needed.

## 2020-11-02 NOTE — Telephone Encounter (Signed)
Patient's wife Angeline called re: PHARM has been requesting the following RX with no response:  MEDICATION: Bromocriptine Mesylate (CYCLOSET) 0.8 MG TABS With refills  PHARMACY:    [facility] ([place]), [place] - [address] Phone:  [phone]  Fax:  [phone]     HAS THE PATIENT CONTACTED THEIR PHARMACY?  Yes-PHARM told Patient to call Dr. Loanne Drilling   IS THIS A 90 DAY SUPPLY : No  IS PATIENT OUT OF MEDICATION: Yes-for 1 week  IF NOT; HOW MUCH IS LEFT: 0  LAST APPOINTMENT DATE: '@6'$ /22/2022  NEXT APPOINTMENT DATE:'@10'$ /24/2022  DO WE HAVE YOUR PERMISSION TO LEAVE A DETAILED MESSAGE?:Yes  OTHER COMMENTS:    **Let patient know to contact pharmacy at the end of the day to make sure medication is ready. **  ** Please notify patient to allow 48-72 hours to process**  **Encourage patient to contact the pharmacy for refills or they can request refills through [facility]**

## 2020-11-03 NOTE — Telephone Encounter (Signed)
Requested dose is different from dose in chart

## 2020-11-05 ENCOUNTER — Other Ambulatory Visit (HOSPITAL_COMMUNITY): Payer: Self-pay

## 2020-11-05 ENCOUNTER — Telehealth: Payer: Self-pay

## 2020-11-05 ENCOUNTER — Other Ambulatory Visit: Payer: Self-pay | Admitting: Endocrinology

## 2020-11-05 MED ORDER — BROMOCRIPTINE MESYLATE 2.5 MG PO TABS: 2.5000 mg | ORAL_TABLET | Freq: Every day | ORAL | 3 refills | Status: DC

## 2020-11-05 NOTE — Telephone Encounter (Addendum)
Pt came into the office today and stated that they are in the donut hole for CYCLOSET. The alternatives are Actos or glipizide. Pt also stated that his BS had spiked to 300 this morning bc he has been out of medication fro the past week and wanted to know if there is anything else that you could prescribed to hold him over until they speak back with the insurance companynto see why now the medication is so high.  Please advise

## 2020-11-05 NOTE — Telephone Encounter (Signed)
Called and spoke with pt and his wife to inform him that his rx has been sent to the pharmacy, pt also that he medication  from early this morning were he was told by Vladimir Faster that he need a PA was incorrect ,  The tech at [employer] didn't run the medication though correct. The pharmacist  corrected the error pt was able to get his medication.

## 2020-11-05 NOTE — Telephone Encounter (Signed)
Patient's wife called to advise that pharmacy states never got RX for Bromocriptine on 11/02/20

## 2020-11-05 NOTE — Telephone Encounter (Signed)
Please see other telephone encounter.

## 2020-11-18 ENCOUNTER — Telehealth: Payer: Self-pay | Admitting: Lab

## 2020-11-18 ENCOUNTER — Other Ambulatory Visit: Payer: Self-pay | Admitting: Internal Medicine

## 2020-11-18 NOTE — Progress Notes (Signed)
  Chronic Care Management   Outreach Note  11/18/2020 Name: Johnny [name] MRN: [id number] DOB: [birth year]  Referred by: Cassandria Anger, MD Reason for referral : Medication Management   An unsuccessful telephone outreach was attempted today. The patient was referred to the pharmacist for assistance with care management and care coordination.   Follow Up Plan:  [place]

## 2020-11-24 ENCOUNTER — Ambulatory Visit (INDEPENDENT_AMBULATORY_CARE_PROVIDER_SITE_OTHER): Payer: Medicare HMO | Admitting: *Deleted

## 2020-11-24 DIAGNOSIS — Z23 Encounter for immunization: Secondary | ICD-10-CM

## 2020-11-24 NOTE — Progress Notes (Signed)
flu

## 2020-11-25 ENCOUNTER — Ambulatory Visit: Payer: Medicare HMO

## 2020-12-28 ENCOUNTER — Other Ambulatory Visit: Payer: Self-pay

## 2020-12-28 ENCOUNTER — Ambulatory Visit (INDEPENDENT_AMBULATORY_CARE_PROVIDER_SITE_OTHER): Payer: Medicare HMO | Admitting: Endocrinology

## 2020-12-28 VITALS — BP 140/52 | HR 66 | Ht 69.0 in | Wt 226.4 lb

## 2020-12-28 DIAGNOSIS — E1159 Type 2 diabetes mellitus with other circulatory complications: Secondary | ICD-10-CM

## 2020-12-28 LAB — POCT GLYCOSYLATED HEMOGLOBIN (HGB A1C): Hemoglobin A1C: 9.3 % — AB (ref 4.0–5.6)

## 2020-12-28 MED ORDER — RYBELSUS 3 MG PO TABS: 3.0000 mg | ORAL_TABLET | Freq: Every day | ORAL | 11 refills | Status: DC

## 2020-12-28 NOTE — Progress Notes (Signed)
Subjective:    Patient ID: Johnny [name], male    DOB: [birth year], 75 y.o.   MRN: [id number]  HPI Pt returns for f/u of diabetes mellitus:  DM type: 2 Dx'ed: 6010 Complications: CAD, PN, and stage 3 CRI.   Therapy: 3 oral meds.  DKA: never Severe hypoglycemia: never Pancreatitis: never Pancreatic imaging: never.   SDOH: he cannot afford name brand meds.   Other: he has never been on insulin, except in the hospital, but he has learned how to take.  edema and CRI limit rx options and dosages.   Interval history: pt states he feels well in general.  He says cbg's vary from 170-200.  he says he never misses meds.    Past Medical History:  Diagnosis Date   Arthritis    At risk for sleep apnea    STOP-BANG=  6   SENT TO PCP 05-21-2013   Benign positional vertigo    Benign prostatic hypertrophy    Blood transfusion without reported diagnosis 2000   CAD (coronary artery disease)    CARDIOLOGIST--  DR Cristopher Peru   CHF (congestive heart failure) (HCC)    Chronic ischemic heart disease, unspecified    ED (erectile dysfunction) of organic origin    GERD (gastroesophageal reflux disease)    History of CHF (congestive heart failure)    systolic   HTN (hypertension)    Hyperlipidemia    Tubular adenoma of colon 12/2009   Type 2 diabetes mellitus ([place])    Wears glasses     Past Surgical History:  Procedure Laterality Date   CARDIAC CATHETERIZATION  03-03-2000  dr Carleene Overlie taylor   normal lvsf/  two-vessel cad significant complex stenosis at distal left main   CARDIOVASCULAR STRESS TEST  08-09-2010  DR GREGG TAYLOR   normal lexiscan nuclear study/  no ischemia/  normal lvf/  ef 54%   CATARACT EXTRACTION W/ INTRAOCULAR LENS IMPLANT Right 03/07/2009   COLONOSCOPY  10/09/2020   12/30/2009   CORONARY ARTERY BYPASS GRAFT  03-06-2000   DR GERHARDT   third vessel   PENILE PROSTHESIS IMPLANT  02/08/2010   COLOPLAST 3-PIECE INFLATABLE   PENILE PROSTHESIS IMPLANT N/A 05/27/2013    Procedure: CYSTO REMOVAL OF PENILE PROSTHESIS;  Surgeon: Claybon Jabs, MD;  Location: [facility];  Service: Urology;  Laterality: N/A;   POLYPECTOMY  12/30/2009   +TA   TOTAL HIP ARTHROPLASTY  01/24/2011   Procedure: TOTAL HIP ARTHROPLASTY;  Surgeon: Dione Plover [name];  Location: [facility];  Service: Orthopedics;  Laterality: Left;   TOTAL HIP ARTHROPLASTY Right 01/30/2013   Procedure: RIGHT TOTAL HIP ARTHROPLASTY;  Surgeon: Gearlean Alf, MD;  Location: [facility];  Service: Orthopedics;  Laterality: Right;    Social History   Socioeconomic History   Marital status: Married    Spouse name: Not on file   Number of children: Not on file   Years of education: Not on file   Highest education level: Not on file  Occupational History   Occupation: Preacher    Employer: RETIRED  Tobacco Use   Smoking status: Former    Types: Cigarettes    Quit date: 05/22/1970    Years since quitting: 50.6   Smokeless tobacco: Never  Vaping Use   Vaping Use: Never used  Substance and Sexual Activity   Alcohol use: No    Comment: seldom   Drug use: No   Sexual activity: Not Currently  Other Topics Concern   Not  on file  Social History Narrative   Regular Exercise-No         Social Determinants of Health   Financial Resource Strain: Low Risk    Difficulty of Paying Living Expenses: Not hard at all  Food Insecurity: No Food Insecurity   Worried About Charity fundraiser in the Last Year: Never true   Arboriculturist in the Last Year: Never true  Transportation Needs: No Transportation Needs   Lack of Transportation (Medical): No   Lack of Transportation (Non-Medical): No  Physical Activity: Sufficiently Active   Days of Exercise per Week: 5 days   Minutes of Exercise per Session: 30 min  Stress: No Stress Concern Present   Feeling of Stress : Not at all  Social Connections: Socially Integrated   Frequency of Communication with Friends and Family: More than three times a week    Frequency of Social Gatherings with Friends and Family: More than three times a week   Attends Religious Services: More than 4 times per year   Active Member of [employer] or Organizations: Yes   Attends Music therapist: More than 4 times per year   Marital Status: Married  Human resources officer Violence: Not on file    Current Outpatient Medications on File Prior to Visit  Medication Sig Dispense Refill   albuterol (VENTOLIN HFA) 108 (90 Base) MCG/ACT inhaler INHALE 2 PUFFS INTO THE LUNGS EVERY 4 HOURS AS NEEDED FOR WHEEZING OR SHORTNESS OF BREATH 18 g 5   Alcohol Swabs (B-D SINGLE USE SWABS REGULAR) PADS 1 each by Does not apply route 2 (two) times daily as needed. Dx: 250.00 100 each 3   allopurinol (ZYLOPRIM) 100 MG tablet TAKE 1 TABLET EVERY DAY 90 tablet 3   aspirin 81 MG EC tablet Take 2 tablets (162 mg total) by mouth daily. Swallow whole. 100 tablet 3   Blood Glucose Calibration (ACCU-CHEK AVIVA) SOLN Use as directed as needed. (Patient taking differently: Check BID) 1 each 2   bromocriptine (PARLODEL) 2.5 MG tablet Take 1 tablet (2.5 mg total) by mouth daily. 90 tablet 3   cholecalciferol (VITAMIN D) 1000 UNITS tablet Take 1,000 Units by mouth every morning.     colchicine 0.6 MG tablet Take 1 tablet (0.6 mg total) by mouth daily. 90 tablet 3   finasteride (PROSCAR) 5 MG tablet TAKE 1 TABLET (5 MG TOTAL) BY MOUTH EVERY MORNING. 90 tablet 3   fluticasone furoate-vilanterol (BREO ELLIPTA) 100-25 MCG/INH AEPB Inhale 1 puff into the lungs daily. 1 each 5   irbesartan-hydrochlorothiazide (AVALIDE) 300-12.5 MG tablet TAKE 1 TABLET EVERY DAY 90 tablet 3   Lancet Devices (ACCU-CHEK SOFTCLIX) lancets Use twice daily as instructed. Dx: 250.00 1 each 3   Lancets (ONETOUCH DELICA PLUS [id number]) MISC USE TWICE DAILY AS DIRECTED 200 each 3   lovastatin (MEVACOR) 40 MG tablet Take 1 tablet (40 mg total) by mouth every morning. 90 tablet 3   metFORMIN (GLUCOPHAGE) 500 MG tablet Take 1  tablet (500 mg total) by mouth daily with breakfast. 90 tablet 3   Multiple Vitamins-Minerals (MULTI-VITAMIN GUMMIES PO) Take 1 tablet by mouth daily.     nitroGLYCERIN (NITROSTAT) 0.4 MG SL tablet Place 1 tablet (0.4 mg total) under the tongue every 5 (five) minutes as needed for chest pain. 25 tablet 3   ONETOUCH ULTRA test strip USE TWICE DAILY AS  INSTRUCTED 200 strip 3   pantoprazole (PROTONIX) 40 MG tablet Take 1 tablet (40 mg total) by  mouth every morning. 90 tablet 3   repaglinide (PRANDIN) 2 MG tablet Take 1 tablet (2 mg total) by mouth 2 (two) times daily before a meal. 180 tablet 3   tamsulosin (FLOMAX) 0.4 MG CAPS capsule TAKE 1 CAPSULE (0.4 MG TOTAL) BY MOUTH DAILY. 90 capsule 3   No current facility-administered medications on file prior to visit.    Allergies  Allergen Reactions   Enalapril     SOB   Hydrocodone-Acetaminophen Nausea Only    Can take codein    Family History  Problem Relation Age of Onset   Hypertension Mother    Diabetes Father    Cancer Sister 45       ovarian ca   Cancer - Other Brother        cancer all over   Coronary artery disease Other        1st degree male relative   Hypertension Other    Colon cancer Neg Hx    Stomach cancer Neg Hx    Esophageal cancer Neg Hx    Pancreatic cancer Neg Hx    Liver disease Neg Hx    Colon polyps Neg Hx    Rectal cancer Neg Hx     BP (!) 140/52 (BP Location: Right Arm, Patient Position: Sitting, Cuff Size: Large)   Pulse 66   Ht 5\' 9"  (1.753 m)   Wt 226 lb 6.4 oz (102.7 kg)   SpO2 98%   BMI 33.43 kg/m    Review of Systems     Objective:   Physical Exam Pulses: dorsalis pedis intact bilat.   MSK: no deformity of the feet CV: 1+ bilat leg edema Skin:  no ulcer on the feet.  normal color and temp on the feet. Neuro: sensation is intact to touch on the feet.  Ext: there is bilateral onychomycosis of the toenails.    Lab Results  Component Value Date   CREATININE 1.49 07/27/2020   BUN 29  (H) 07/27/2020   NA 139 07/27/2020   K 4.5 07/27/2020   CL 105 07/27/2020   CO2 24 07/27/2020   Lab Results  Component Value Date   HGBA1C 9.3 (A) 12/28/2020      Assessment & Plan:  Type 2 DM: uncontrolled  Patient Instructions  I have sent a prescription to your pharmacy, to add Rybelsus.   Please continue the same other diabetes medications.   check your blood sugar once a day.  vary the time of day when you check, between before the 3 meals, and at bedtime.  also check if you have symptoms of your blood sugar being too high or too low.  please keep a record of the readings and bring it to your next appointment here (or you can bring the meter itself).  You can write it on any piece of paper.  please call us sooner if your blood sugar goes below 70, or if you have a lot of readings over 200.   Please come back for a follow-up appointment in 3 months.

## 2020-12-28 NOTE — Patient Instructions (Addendum)
I have sent a prescription to your pharmacy, to add Rybelsus.   Please continue the same other diabetes medications.   check your blood sugar once a day.  vary the time of day when you check, between before the 3 meals, and at bedtime.  also check if you have symptoms of your blood sugar being too high or too low.  please keep a record of the readings and bring it to your next appointment here (or you can bring the meter itself).  You can write it on any piece of paper.  please call us sooner if your blood sugar goes below 70, or if you have a lot of readings over 200.   Please come back for a follow-up appointment in 3 months.

## 2021-01-05 ENCOUNTER — Other Ambulatory Visit: Payer: Self-pay | Admitting: Endocrinology

## 2021-01-07 ENCOUNTER — Other Ambulatory Visit: Payer: Self-pay | Admitting: Endocrinology

## 2021-01-31 ENCOUNTER — Other Ambulatory Visit: Payer: Self-pay | Admitting: Internal Medicine

## 2021-03-19 ENCOUNTER — Telehealth: Payer: Self-pay | Admitting: Internal Medicine

## 2021-03-19 NOTE — Telephone Encounter (Signed)
Left message for patient to call back to schedule Medicare Annual Wellness Visit   Last AWV  02/18/20  Please schedule at anytime with [facility] if patient calls the office back.    40 Minutes appointment   Any questions, please call me at [phone]

## 2021-03-29 DIAGNOSIS — E119 Type 2 diabetes mellitus without complications: Secondary | ICD-10-CM | POA: Diagnosis not present

## 2021-03-29 DIAGNOSIS — H35342 Macular cyst, hole, or pseudohole, left eye: Secondary | ICD-10-CM | POA: Diagnosis not present

## 2021-03-29 DIAGNOSIS — H04123 Dry eye syndrome of bilateral lacrimal glands: Secondary | ICD-10-CM | POA: Diagnosis not present

## 2021-03-29 DIAGNOSIS — H26493 Other secondary cataract, bilateral: Secondary | ICD-10-CM | POA: Diagnosis not present

## 2021-03-31 ENCOUNTER — Other Ambulatory Visit: Payer: Self-pay

## 2021-03-31 ENCOUNTER — Ambulatory Visit (INDEPENDENT_AMBULATORY_CARE_PROVIDER_SITE_OTHER): Payer: Medicare HMO | Admitting: Endocrinology

## 2021-03-31 VITALS — BP 120/56 | HR 67 | Ht 69.0 in | Wt 220.0 lb

## 2021-03-31 DIAGNOSIS — E1159 Type 2 diabetes mellitus with other circulatory complications: Secondary | ICD-10-CM | POA: Diagnosis not present

## 2021-03-31 LAB — POCT GLYCOSYLATED HEMOGLOBIN (HGB A1C): Hemoglobin A1C: 8.8 % — AB (ref 4.0–5.6)

## 2021-03-31 MED ORDER — RYBELSUS 7 MG PO TABS: 7.0000 mg | ORAL_TABLET | ORAL | 11 refills | Status: DC

## 2021-03-31 NOTE — Patient Instructions (Addendum)
I have sent a prescription to your pharmacy, to add Rybelsus.   Please continue the same other diabetes medications.   check your blood sugar once a day.  vary the time of day when you check, between before the 3 meals, and at bedtime.  also check if you have symptoms of your blood sugar being too high or too low.  please keep a record of the readings and bring it to your next appointment here (or you can bring the meter itself).  You can write it on any piece of paper.  please call us sooner if your blood sugar goes below 70, or if you have a lot of readings over 200.   Please come back for a follow-up appointment in 2 months.

## 2021-03-31 NOTE — Progress Notes (Signed)
Subjective:    Patient ID: Johnny [name], male    DOB: [birth year], 76 y.o.   MRN: [id number]  HPI Pt returns for f/u of diabetes mellitus:  DM type: 2 Dx'ed: 3154 Complications: CAD, PN, and stage 3 CRI.   Therapy: 4 oral meds.  DKA: never Severe hypoglycemia: never Pancreatitis: never Pancreatic imaging: never.   SDOH: he cannot afford name brand meds.   Other: he has never been on insulin, except in the hospital, but he has learned how to take.  edema and CRI limit rx options and dosages.   Interval history: pt states he feels well in general.  He says cbg's vary from 149-330.  he says he never misses meds.  He has lost 6 lbs since last ov.   Past Medical History:  Diagnosis Date   Arthritis    At risk for sleep apnea    STOP-BANG=  6   SENT TO PCP 05-21-2013   Benign positional vertigo    Benign prostatic hypertrophy    Blood transfusion without reported diagnosis 2000   CAD (coronary artery disease)    CARDIOLOGIST--  DR Cristopher Peru   CHF (congestive heart failure) (HCC)    Chronic ischemic heart disease, unspecified    ED (erectile dysfunction) of organic origin    GERD (gastroesophageal reflux disease)    History of CHF (congestive heart failure)    systolic   HTN (hypertension)    Hyperlipidemia    Tubular adenoma of colon 12/2009   Type 2 diabetes mellitus ([place])    Wears glasses     Past Surgical History:  Procedure Laterality Date   CARDIAC CATHETERIZATION  03-03-2000  dr Carleene Overlie taylor   normal lvsf/  two-vessel cad significant complex stenosis at distal left main   CARDIOVASCULAR STRESS TEST  08-09-2010  DR GREGG TAYLOR   normal lexiscan nuclear study/  no ischemia/  normal lvf/  ef 54%   CATARACT EXTRACTION W/ INTRAOCULAR LENS IMPLANT Right 03/07/2009   COLONOSCOPY  10/09/2020   12/30/2009   CORONARY ARTERY BYPASS GRAFT  03-06-2000   DR GERHARDT   third vessel   PENILE PROSTHESIS IMPLANT  02/08/2010   COLOPLAST 3-PIECE INFLATABLE   PENILE PROSTHESIS  IMPLANT N/A 05/27/2013   Procedure: CYSTO REMOVAL OF PENILE PROSTHESIS;  Surgeon: Claybon Jabs, MD;  Location: [facility];  Service: Urology;  Laterality: N/A;   POLYPECTOMY  12/30/2009   +TA   TOTAL HIP ARTHROPLASTY  01/24/2011   Procedure: TOTAL HIP ARTHROPLASTY;  Surgeon: Dione Plover [name];  Location: [facility];  Service: Orthopedics;  Laterality: Left;   TOTAL HIP ARTHROPLASTY Right 01/30/2013   Procedure: RIGHT TOTAL HIP ARTHROPLASTY;  Surgeon: Gearlean Alf, MD;  Location: [facility];  Service: Orthopedics;  Laterality: Right;    Social History   Socioeconomic History   Marital status: Married    Spouse name: Not on file   Number of children: Not on file   Years of education: Not on file   Highest education level: Not on file  Occupational History   Occupation: Preacher    Employer: RETIRED  Tobacco Use   Smoking status: Former    Types: Cigarettes    Quit date: 05/22/1970    Years since quitting: 50.9   Smokeless tobacco: Never  Vaping Use   Vaping Use: Never used  Substance and Sexual Activity   Alcohol use: No    Comment: seldom   Drug use: No   Sexual activity: Not  Currently  Other Topics Concern   Not on file  Social History Narrative   Regular Exercise-No         Social Determinants of Health   Financial Resource Strain: Not on file  Food Insecurity: Not on file  Transportation Needs: Not on file  Physical Activity: Not on file  Stress: Not on file  Social Connections: Not on file  Intimate Partner Violence: Not on file    Current Outpatient Medications on File Prior to Visit  Medication Sig Dispense Refill   albuterol (VENTOLIN HFA) 108 (90 Base) MCG/ACT inhaler INHALE 2 PUFFS INTO THE LUNGS EVERY 4 HOURS AS NEEDED FOR WHEEZING OR SHORTNESS OF BREATH 18 g 5   Alcohol Swabs (B-D SINGLE USE SWABS REGULAR) PADS 1 each by Does not apply route 2 (two) times daily as needed. Dx: 250.00 100 each 3   allopurinol (ZYLOPRIM) 100 MG tablet TAKE 1  TABLET EVERY DAY 90 tablet 3   aspirin 81 MG EC tablet Take 2 tablets (162 mg total) by mouth daily. Swallow whole. 100 tablet 3   Blood Glucose Calibration (ACCU-CHEK AVIVA) SOLN Use as directed as needed. (Patient taking differently: Check BID) 1 each 2   bromocriptine (PARLODEL) 2.5 MG tablet Take 1 tablet (2.5 mg total) by mouth daily. 90 tablet 3   cholecalciferol (VITAMIN D) 1000 UNITS tablet Take 1,000 Units by mouth every morning.     colchicine 0.6 MG tablet Take 1 tablet (0.6 mg total) by mouth daily. 90 tablet 3   finasteride (PROSCAR) 5 MG tablet TAKE 1 TABLET (5 MG TOTAL) BY MOUTH EVERY MORNING. 90 tablet 3   fluticasone furoate-vilanterol (BREO ELLIPTA) 100-25 MCG/INH AEPB Inhale 1 puff into the lungs daily. 1 each 5   irbesartan-hydrochlorothiazide (AVALIDE) 300-12.5 MG tablet TAKE 1 TABLET EVERY DAY 90 tablet 3   Lancet Devices (ACCU-CHEK SOFTCLIX) lancets Use twice daily as instructed. Dx: 250.00 1 each 3   Lancets (ONETOUCH DELICA PLUS JMEQAS34H) MISC USE TWICE DAILY AS DIRECTED 200 each 3   lovastatin (MEVACOR) 40 MG tablet TAKE 1 TABLET EVERY MORNING 90 tablet 3   metFORMIN (GLUCOPHAGE) 500 MG tablet Take 1 tablet by mouth once daily with breakfast 90 tablet 0   Multiple Vitamins-Minerals (MULTI-VITAMIN GUMMIES PO) Take 1 tablet by mouth daily.     nitroGLYCERIN (NITROSTAT) 0.4 MG SL tablet Place 1 tablet (0.4 mg total) under the tongue every 5 (five) minutes as needed for chest pain. 25 tablet 3   ONETOUCH ULTRA test strip USE TWICE DAILY AS  INSTRUCTED 200 strip 3   pantoprazole (PROTONIX) 40 MG tablet TAKE 1 TABLET EVERY MORNING 90 tablet 3   repaglinide (PRANDIN) 2 MG tablet Take 1 tablet (2 mg total) by mouth 2 (two) times daily before a meal. 180 tablet 3   tamsulosin (FLOMAX) 0.4 MG CAPS capsule TAKE 1 CAPSULE (0.4 MG TOTAL) BY MOUTH DAILY. 90 capsule 3   No current facility-administered medications on file prior to visit.    Allergies  Allergen Reactions    Enalapril     SOB   Hydrocodone-Acetaminophen Nausea Only    Can take codein    Family History  Problem Relation Age of Onset   Hypertension Mother    Diabetes Father    Cancer Sister 80       ovarian ca   Cancer - Other Brother        cancer all over   Coronary artery disease Other  1st degree male relative   Hypertension Other    Colon cancer Neg Hx    Stomach cancer Neg Hx    Esophageal cancer Neg Hx    Pancreatic cancer Neg Hx    Liver disease Neg Hx    Colon polyps Neg Hx    Rectal cancer Neg Hx     BP (!) 120/56    Pulse 67    Ht 5\' 9"  (1.753 m)    Wt 220 lb (99.8 kg)    SpO2 97%    BMI 32.49 kg/m    Review of Systems He denies hypoglycemia/N/V/HB.      Objective:   Physical Exam   A1c=8.8%    Assessment & Plan:  Type 2 DM: uncontrolled  Patient Instructions  I have sent a prescription to your pharmacy, to add Rybelsus.   Please continue the same other diabetes medications.   check your blood sugar once a day.  vary the time of day when you check, between before the 3 meals, and at bedtime.  also check if you have symptoms of your blood sugar being too high or too low.  please keep a record of the readings and bring it to your next appointment here (or you can bring the meter itself).  You can write it on any piece of paper.  please call us sooner if your blood sugar goes below 70, or if you have a lot of readings over 200.   Please come back for a follow-up appointment in 2 months.

## 2021-04-05 ENCOUNTER — Other Ambulatory Visit: Payer: Self-pay

## 2021-04-05 ENCOUNTER — Ambulatory Visit: Payer: Medicare HMO

## 2021-04-06 ENCOUNTER — Telehealth: Payer: Self-pay

## 2021-04-06 MED ORDER — ALBUTEROL SULFATE HFA 108 (90 BASE) MCG/ACT IN AERS: INHALATION_SPRAY | RESPIRATORY_TRACT | 5 refills | Status: DC

## 2021-04-06 NOTE — Telephone Encounter (Signed)
Pt is up-to-date sent refills to [facility].Marland KitchenJohny [name]

## 2021-04-06 NOTE — Telephone Encounter (Signed)
Pt wife calling on behave of pt for refill on: albuterol (VENTOLIN HFA) 108 (90 Base) MCG/ACT inhaler  Pharmacy: [facility] ([place]), [place] - [address]  LOV 07/27/20

## 2021-04-13 ENCOUNTER — Ambulatory Visit: Payer: Medicare HMO

## 2021-04-14 ENCOUNTER — Ambulatory Visit: Payer: Medicare HMO

## 2021-04-14 ENCOUNTER — Ambulatory Visit: Payer: Medicare HMO | Admitting: Internal Medicine

## 2021-04-20 ENCOUNTER — Other Ambulatory Visit: Payer: Self-pay | Admitting: Endocrinology

## 2021-04-23 ENCOUNTER — Ambulatory Visit: Payer: Medicare HMO | Admitting: Internal Medicine

## 2021-04-27 ENCOUNTER — Ambulatory Visit: Payer: Medicare HMO

## 2021-04-28 ENCOUNTER — Ambulatory Visit: Payer: Medicare HMO

## 2021-05-03 ENCOUNTER — Ambulatory Visit (INDEPENDENT_AMBULATORY_CARE_PROVIDER_SITE_OTHER): Payer: Medicare HMO | Admitting: Internal Medicine

## 2021-05-03 ENCOUNTER — Encounter: Payer: Self-pay | Admitting: Internal Medicine

## 2021-05-03 ENCOUNTER — Other Ambulatory Visit: Payer: Self-pay

## 2021-05-03 ENCOUNTER — Ambulatory Visit (INDEPENDENT_AMBULATORY_CARE_PROVIDER_SITE_OTHER): Payer: Medicare HMO

## 2021-05-03 VITALS — BP 122/60 | HR 71 | Temp 97.7°F | Ht 69.0 in | Wt 223.0 lb

## 2021-05-03 VITALS — BP 122/60 | HR 71 | Temp 97.7°F | Resp 18 | Ht 69.0 in | Wt 223.4 lb

## 2021-05-03 DIAGNOSIS — I10 Essential (primary) hypertension: Secondary | ICD-10-CM

## 2021-05-03 DIAGNOSIS — M10079 Idiopathic gout, unspecified ankle and foot: Secondary | ICD-10-CM | POA: Diagnosis not present

## 2021-05-03 DIAGNOSIS — Z0000 Encounter for general adult medical examination without abnormal findings: Secondary | ICD-10-CM

## 2021-05-03 DIAGNOSIS — E785 Hyperlipidemia, unspecified: Secondary | ICD-10-CM | POA: Diagnosis not present

## 2021-05-03 DIAGNOSIS — K219 Gastro-esophageal reflux disease without esophagitis: Secondary | ICD-10-CM | POA: Diagnosis not present

## 2021-05-03 DIAGNOSIS — N320 Bladder-neck obstruction: Secondary | ICD-10-CM

## 2021-05-03 DIAGNOSIS — E1159 Type 2 diabetes mellitus with other circulatory complications: Secondary | ICD-10-CM

## 2021-05-03 LAB — COMPREHENSIVE METABOLIC PANEL
ALT: 11 U/L (ref 0–53)
AST: 13 U/L (ref 0–37)
Albumin: 4.2 g/dL (ref 3.5–5.2)
Alkaline Phosphatase: 96 U/L (ref 39–117)
BUN: 25 mg/dL — ABNORMAL HIGH (ref 6–23)
CO2: 28 mEq/L (ref 19–32)
Calcium: 9.4 mg/dL (ref 8.4–10.5)
Chloride: 104 mEq/L (ref 96–112)
Creatinine, Ser: 1.56 mg/dL — ABNORMAL HIGH (ref 0.40–1.50)
GFR: 42.87 mL/min — ABNORMAL LOW (ref >60.00)
Glucose, Bld: 138 mg/dL — ABNORMAL HIGH (ref 70–99)
Potassium: 4.6 mEq/L (ref 3.5–5.1)
Sodium: 139 mEq/L (ref 135–145)
Total Bilirubin: 0.4 mg/dL (ref 0.2–1.2)
Total Protein: 6.9 g/dL (ref 6.0–8.3)

## 2021-05-03 LAB — URIC ACID: Uric Acid, Serum: 6.9 mg/dL (ref 4.0–7.8)

## 2021-05-03 NOTE — Assessment & Plan Note (Signed)
Dr Loanne Drilling Chronic Metformin, Prandin, Bromocriptine; Irbesartan HCT and Lovastatin

## 2021-05-03 NOTE — Patient Instructions (Signed)
[name] , Thank you for taking time to come for your Medicare Wellness Visit. I appreciate your ongoing commitment to your health goals. Please review the following plan we discussed and let me know if I can assist you in the future.   Screening recommendations/referrals: Colonoscopy: 10/09/2020; no repeat due to age Recommended yearly ophthalmology/optometry visit for glaucoma screening and checkup Recommended yearly dental visit for hygiene and checkup  Vaccinations: Influenza vaccine: 11/24/2020 Pneumococcal vaccine: 06/03/2013, 02/08/2016 Tdap vaccine: 03/20/2012; due every 10 years Shingles vaccine: never done   Covid-19: 04/10/2019, 05/21/2019, 03/10/2020  Advanced directives: Advance directive discussed with you today. I have provided a copy for you to complete at home and have notarized. Once this is complete please bring a copy in to our office so we can scan it into your chart.  Conditions/risks identified: Yes; Client understands the importance of follow-up appointments with providers by attending scheduled visits and discussed goals to eat healthier, increase physical activity 5 times a week for 30 minutes each, exercise the brain by doing stimulating brain exercises (reading, adult coloring, crafting, listening to music, puzzles, etc.), socialize and enjoy life more, get enough sleep at least 8-9 hours average per night and make time for laughter.  Next appointment: Please schedule your next Medicare Wellness Visit with your Nurse Health Advisor in 1 year by calling [phone].  Preventive Care 11 Years and Older, Male Preventive care refers to lifestyle choices and visits with your health care provider that can promote health and wellness. What does preventive care include? A yearly physical exam. This is also called an annual well check. Dental exams once or twice a year. Routine eye exams. Ask your health care provider how often you should have your eyes checked. Personal lifestyle  choices, including: Daily care of your teeth and gums. Regular physical activity. Eating a healthy diet. Avoiding tobacco and drug use. Limiting alcohol use. Practicing safe sex. Taking low doses of aspirin every day. Taking vitamin and mineral supplements as recommended by your health care provider. What happens during an annual well check? The services and screenings done by your health care provider during your annual well check will depend on your age, overall health, lifestyle risk factors, and family history of disease. Counseling  Your health care provider may ask you questions about your: Alcohol use. Tobacco use. Drug use. Emotional well-being. Home and relationship well-being. Sexual activity. Eating habits. History of falls. Memory and ability to understand (cognition). Work and work Statistician. Screening  You may have the following tests or measurements: Height, weight, and BMI. Blood pressure. Lipid and cholesterol levels. These may be checked every 5 years, or more frequently if you are over 49 years old. Skin check. Lung cancer screening. You may have this screening every year starting at age 68 if you have a 30-pack-year history of smoking and currently smoke or have quit within the past 15 years. Fecal occult blood test (FOBT) of the stool. You may have this test every year starting at age 31. Flexible sigmoidoscopy or colonoscopy. You may have a sigmoidoscopy every 5 years or a colonoscopy every 10 years starting at age 67. Prostate cancer screening. Recommendations will vary depending on your family history and other risks. Hepatitis C blood test. Hepatitis B blood test. Sexually transmitted disease (STD) testing. Diabetes screening. This is done by checking your blood sugar (glucose) after you have not eaten for a while (fasting). You may have this done every 1-3 years. Abdominal aortic aneurysm (AAA) screening. You  may need this if you are a current or  former smoker. Osteoporosis. You may be screened starting at age 60 if you are at high risk. Talk with your health care provider about your test results, treatment options, and if necessary, the need for more tests. Vaccines  Your health care provider may recommend certain vaccines, such as: Influenza vaccine. This is recommended every year. Tetanus, diphtheria, and acellular pertussis (Tdap, Td) vaccine. You may need a Td booster every 10 years. Zoster vaccine. You may need this after age 64. Pneumococcal 13-valent conjugate (PCV13) vaccine. One dose is recommended after age 44. Pneumococcal polysaccharide (PPSV23) vaccine. One dose is recommended after age 70. Talk to your health care provider about which screenings and vaccines you need and how often you need them. This information is not intended to replace advice given to you by your health care provider. Make sure you discuss any questions you have with your health care provider. Document Released: 03/20/2015 Document Revised: 11/11/2015 Document Reviewed: 12/23/2014 Elsevier Interactive Patient Education  2017 [place] Prevention in the Home Falls can cause injuries. They can happen to people of all ages. There are many things you can do to make your home safe and to help prevent falls. What can I do on the outside of my home? Regularly fix the edges of walkways and driveways and fix any cracks. Remove anything that might make you trip as you walk through a door, such as a raised step or threshold. Trim any bushes or trees on the path to your home. Use bright outdoor lighting. Clear any walking paths of anything that might make someone trip, such as rocks or tools. Regularly check to see if handrails are loose or broken. Make sure that both sides of any steps have handrails. Any raised decks and porches should have guardrails on the edges. Have any leaves, snow, or ice cleared regularly. Use sand or salt on walking paths  during winter. Clean up any spills in your garage right away. This includes oil or grease spills. What can I do in the bathroom? Use night lights. Install grab bars by the toilet and in the tub and shower. Do not use towel bars as grab bars. Use non-skid mats or decals in the tub or shower. If you need to sit down in the shower, use a plastic, non-slip stool. Keep the floor dry. Clean up any water that spills on the floor as soon as it happens. Remove soap buildup in the tub or shower regularly. Attach bath mats securely with double-sided non-slip rug tape. Do not have throw rugs and other things on the floor that can make you trip. What can I do in the bedroom? Use night lights. Make sure that you have a light by your bed that is easy to reach. Do not use any sheets or blankets that are too big for your bed. They should not hang down onto the floor. Have a firm chair that has side arms. You can use this for support while you get dressed. Do not have throw rugs and other things on the floor that can make you trip. What can I do in the kitchen? Clean up any spills right away. Avoid walking on wet floors. Keep items that you use a lot in easy-to-reach places. If you need to reach something above you, use a strong step stool that has a grab bar. Keep electrical cords out of the way. Do not use floor polish or wax that  makes floors slippery. If you must use wax, use non-skid floor wax. Do not have throw rugs and other things on the floor that can make you trip. What can I do with my stairs? Do not leave any items on the stairs. Make sure that there are handrails on both sides of the stairs and use them. Fix handrails that are broken or loose. Make sure that handrails are as long as the stairways. Check any carpeting to make sure that it is firmly attached to the stairs. Fix any carpet that is loose or worn. Avoid having throw rugs at the top or bottom of the stairs. If you do have throw  rugs, attach them to the floor with carpet tape. Make sure that you have a light switch at the top of the stairs and the bottom of the stairs. If you do not have them, ask someone to add them for you. What else can I do to help prevent falls? Wear shoes that: Do not have high heels. Have rubber bottoms. Are comfortable and fit you well. Are closed at the toe. Do not wear sandals. If you use a stepladder: Make sure that it is fully opened. Do not climb a closed stepladder. Make sure that both sides of the stepladder are locked into place. Ask someone to hold it for you, if possible. Clearly mark and make sure that you can see: Any grab bars or handrails. First and last steps. Where the edge of each step is. Use tools that help you move around (mobility aids) if they are needed. These include: Canes. Walkers. Scooters. Crutches. Turn on the lights when you go into a dark area. Replace any light bulbs as soon as they burn out. Set up your furniture so you have a clear path. Avoid moving your furniture around. If any of your floors are uneven, fix them. If there are any pets around you, be aware of where they are. Review your medicines with your doctor. Some medicines can make you feel dizzy. This can increase your chance of falling. Ask your doctor what other things that you can do to help prevent falls. This information is not intended to replace advice given to you by your health care provider. Make sure you discuss any questions you have with your health care provider. Document Released: 12/18/2008 Document Revised: 07/30/2015 Document Reviewed: 03/28/2014 Elsevier Interactive Patient Education  2017 [employer].

## 2021-05-03 NOTE — Progress Notes (Addendum)
Subjective:   Johnny [name] is a 76 y.o. male who presents for Medicare Annual/Subsequent preventive examination.  Review of Systems     Cardiac Risk Factors include: advanced age (>12men, >59 women);diabetes mellitus;dyslipidemia;hypertension;family history of premature cardiovascular disease;male gender;obesity (BMI >30kg/m2)     Objective:    Today's Vitals   05/03/21 1357  BP: 122/60  Pulse: 71  Resp: 18  Temp: 97.7 F (36.5 C)  TempSrc: Temporal  SpO2: 98%  Weight: 223 lb 6.4 oz (101.3 kg)  Height: 5\' 9"  (1.753 m)  PainSc: 0-No pain   Body mass index is 32.99 kg/m.  Advanced Directives 05/03/2021 02/18/2020 12/17/2019 10/13/2016 12/25/2014 05/27/2013 01/30/2013  Does Patient Have a Medical Advance Directive? No No No No No Patient does not have advance directive;Patient would not like information Patient does not have advance directive  Would patient like information on creating a medical advance directive? No - Patient declined No - Patient declined No - Patient declined No - Patient declined - - -  Pre-existing out of facility DNR order (yellow form or pink MOST form) - - - - - - No    Current Medications (verified) Outpatient Encounter Medications as of 05/03/2021  Medication Sig   albuterol (VENTOLIN HFA) 108 (90 Base) MCG/ACT inhaler INHALE 2 PUFFS INTO THE LUNGS EVERY 4 HOURS AS NEEDED FOR WHEEZING OR SHORTNESS OF BREATH   Alcohol Swabs (B-D SINGLE USE SWABS REGULAR) PADS 1 each by Does not apply route 2 (two) times daily as needed. Dx: 250.00   allopurinol (ZYLOPRIM) 100 MG tablet TAKE 1 TABLET EVERY DAY   aspirin 81 MG EC tablet Take 2 tablets (162 mg total) by mouth daily. Swallow whole.   Blood Glucose Calibration (ACCU-CHEK AVIVA) SOLN Use as directed as needed. (Patient taking differently: Check BID)   bromocriptine (PARLODEL) 2.5 MG tablet Take 1 tablet (2.5 mg total) by mouth daily.   cholecalciferol (VITAMIN D) 1000 UNITS tablet Take 1,000 Units by mouth  every morning.   colchicine 0.6 MG tablet Take 1 tablet (0.6 mg total) by mouth daily.   finasteride (PROSCAR) 5 MG tablet TAKE 1 TABLET (5 MG TOTAL) BY MOUTH EVERY MORNING.   fluticasone furoate-vilanterol (BREO ELLIPTA) 100-25 MCG/INH AEPB Inhale 1 puff into the lungs daily.   irbesartan-hydrochlorothiazide (AVALIDE) 300-12.5 MG tablet TAKE 1 TABLET EVERY DAY   Lancet Devices (ACCU-CHEK SOFTCLIX) lancets Use twice daily as instructed. Dx: 250.00   Lancets (ONETOUCH DELICA PLUS [id number]) MISC USE TWICE DAILY AS DIRECTED   lovastatin (MEVACOR) 40 MG tablet TAKE 1 TABLET EVERY MORNING   metFORMIN (GLUCOPHAGE) 500 MG tablet Take 1 tablet by mouth once daily with breakfast   Multiple Vitamins-Minerals (MULTI-VITAMIN GUMMIES PO) Take 1 tablet by mouth daily.   nitroGLYCERIN (NITROSTAT) 0.4 MG SL tablet Place 1 tablet (0.4 mg total) under the tongue every 5 (five) minutes as needed for chest pain.   ONETOUCH ULTRA test strip USE TWICE DAILY AS  INSTRUCTED   pantoprazole (PROTONIX) 40 MG tablet TAKE 1 TABLET EVERY MORNING   repaglinide (PRANDIN) 2 MG tablet Take 1 tablet (2 mg total) by mouth 2 (two) times daily before a meal.   Semaglutide (RYBELSUS) 7 MG TABS Take 7 mg by mouth every morning.   tamsulosin (FLOMAX) 0.4 MG CAPS capsule TAKE 1 CAPSULE (0.4 MG TOTAL) BY MOUTH DAILY.   No facility-administered encounter medications on file as of 05/03/2021.    Allergies (verified) Enalapril and Hydrocodone-acetaminophen   History: Past Medical History:  Diagnosis  Date   Arthritis    At risk for sleep apnea    STOP-BANG=  6   SENT TO PCP 05-21-2013   Benign positional vertigo    Benign prostatic hypertrophy    Blood transfusion without reported diagnosis 2000   CAD (coronary artery disease)    CARDIOLOGIST--  DR Cristopher Peru   CHF (congestive heart failure) (HCC)    Chronic ischemic heart disease, unspecified    ED (erectile dysfunction) of organic origin    GERD (gastroesophageal reflux  disease)    History of CHF (congestive heart failure)    systolic   HTN (hypertension)    Hyperlipidemia    Tubular adenoma of colon 12/2009   Type 2 diabetes mellitus ([place])    Wears glasses    Past Surgical History:  Procedure Laterality Date   CARDIAC CATHETERIZATION  03-03-2000  dr Carleene Overlie taylor   normal lvsf/  two-vessel cad significant complex stenosis at distal left main   CARDIOVASCULAR STRESS TEST  08-09-2010  DR GREGG TAYLOR   normal lexiscan nuclear study/  no ischemia/  normal lvf/  ef 54%   CATARACT EXTRACTION W/ INTRAOCULAR LENS IMPLANT Right 03/07/2009   COLONOSCOPY  10/09/2020   12/30/2009   CORONARY ARTERY BYPASS GRAFT  03-06-2000   DR GERHARDT   third vessel   PENILE PROSTHESIS IMPLANT  02/08/2010   COLOPLAST 3-PIECE INFLATABLE   PENILE PROSTHESIS IMPLANT N/A 05/27/2013   Procedure: CYSTO REMOVAL OF PENILE PROSTHESIS;  Surgeon: Claybon Jabs, MD;  Location: [facility];  Service: Urology;  Laterality: N/A;   POLYPECTOMY  12/30/2009   +TA   TOTAL HIP ARTHROPLASTY  01/24/2011   Procedure: TOTAL HIP ARTHROPLASTY;  Surgeon: Dione Plover [name];  Location: [facility];  Service: Orthopedics;  Laterality: Left;   TOTAL HIP ARTHROPLASTY Right 01/30/2013   Procedure: RIGHT TOTAL HIP ARTHROPLASTY;  Surgeon: Gearlean Alf, MD;  Location: [facility];  Service: Orthopedics;  Laterality: Right;   Family History  Problem Relation Age of Onset   Hypertension Mother    Diabetes Father    Cancer Sister 25       ovarian ca   Cancer - Other Brother        cancer all over   Coronary artery disease Other        1st degree male relative   Hypertension Other    Colon cancer Neg Hx    Stomach cancer Neg Hx    Esophageal cancer Neg Hx    Pancreatic cancer Neg Hx    Liver disease Neg Hx    Colon polyps Neg Hx    Rectal cancer Neg Hx    Social History   Socioeconomic History   Marital status: Married    Spouse name: Not on file   Number of children: 2   Years of  education: Not on file   Highest education level: Not on file  Occupational History   Occupation: Engineer, building services: RETIRED  Tobacco Use   Smoking status: Former    Types: Cigarettes    Quit date: 05/22/1970    Years since quitting: 50.9   Smokeless tobacco: Never  Vaping Use   Vaping Use: Never used  Substance and Sexual Activity   Alcohol use: No    Comment: seldom   Drug use: No   Sexual activity: Not Currently  Other Topics Concern   Not on file  Social History Narrative   Regular Exercise-Yes (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)   Married; has 2 daughters   He is a Programme researcher, 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    Difficulty of Paying Living Expenses: Not hard at all  Food Insecurity: No Food Insecurity   Worried About Charity fundraiser in the Last Year: Never true   Arboriculturist in the Last Year: Never true  Transportation Needs: No Transportation Needs   Lack of Transportation (Medical): No   Lack of Transportation (Non-Medical): No  Physical Activity: Sufficiently Active   Days of Exercise per Week: 5 days   Minutes of Exercise per Session: 50 min  Stress: No Stress Concern Present   Feeling of Stress : Not at all  Social Connections: Socially Integrated   Frequency of Communication with Friends and Family: More than three times a week   Frequency of Social Gatherings with Friends and Family: More than three times a week   Attends Religious Services: More than 4 times per year   Active Member of [employer] or Organizations: Yes   Attends Music therapist: More than 4 times per year   Marital Status: Married    Tobacco Counseling Counseling given: Not Answered   Clinical Intake:  Pre-visit preparation completed: Yes  Pain : No/denies pain Pain Score: 0-No pain     BMI - recorded: 32.93 Nutritional Status: BMI > 30  Obese Nutritional Risks: None Diabetes: Yes CBG done?:  No Did pt. bring in CBG monitor from home?: No  How often do you need to have someone help you when you read instructions, pamphlets, or other written materials from your doctor or pharmacy?: 1 - Never What is the last grade level you completed in school?: Pastor  Diabetic? yes  Interpreter Needed?: No  Information entered by :: Lisette Abu, LPN   Activities of Daily Living In your present state of health, do you have any difficulty performing the following activities: 05/03/2021  Hearing? N  Vision? N  Difficulty concentrating or making decisions? N  Walking or climbing stairs? N  Dressing or bathing? N  Doing errands, shopping? N  Preparing Food and eating ? N  Using the Toilet? N  In the past six months, have you accidently leaked urine? N  Do you have problems with loss of bowel control? N  Managing your Medications? N  Managing your Finances? N  Housekeeping or managing your Housekeeping? N  Some recent data might be hidden    Patient Care Team: Plotnikov, Evie Lacks, MD as PCP - General Kathie Rhodes, MD (Inactive) (Urology) Gaynelle Arabian, MD (Orthopedic Surgery) Evans Lance, MD (Cardiology) Iran Planas, MD as Consulting Physician (Orthopedic Surgery) Renato Shin, MD as Consulting Physician (Endocrinology) Calvert Cantor, MD as Consulting Physician (Ophthalmology)  Indicate any recent Medical Services you may have received from other than [facility] providers in the past year (date may be approximate).     Assessment:   This is a routine wellness examination for [place].  Hearing/Vision screen Hearing Screening - Comments:: Patient denied any hearing difficulty.   No hearing aids.  Vision Screening - Comments:: Patient wears corrective glasses/contacts.   Eye exam done annually by: [facility]  Dietary issues and exercise activities discussed: Current Exercise Habits: Structured exercise class, Type of exercise: stretching;treadmill;walking,  Time (Minutes): 50, Frequency (Times/Week): 5, Weekly Exercise (Minutes/Week): 250, Intensity: Moderate   Goals Addressed  This Visit's Progress    Weight       My goal is to continue to lose weight by being physically active and reach my ideal weight of 200 pounds.      Depression Screen PHQ 2/9 Scores 05/03/2021 05/03/2021 02/18/2020 11/14/2019 01/25/2018 10/13/2016 02/08/2016  PHQ - 2 Score 0 0 0 0 0 0 0    Fall Risk Fall Risk  05/03/2021 05/03/2021 02/18/2020 11/14/2019 01/25/2018  Falls in the past year? 0 0 0 0 0  Number falls in past yr: 0 0 0 0 -  Injury with Fall? 0 0 0 0 -  Risk for fall due to : No Fall Risks No Fall Risks No Fall Risks - -  Follow up Falls evaluation completed Falls evaluation completed Falls evaluation completed - Falls evaluation completed    [place]:  Any stairs in or around the home? No  If so, are there any without handrails? No  Home free of loose throw rugs in walkways, pet beds, electrical cords, etc? Yes  Adequate lighting in your home to reduce risk of falls? Yes   ASSISTIVE DEVICES UTILIZED TO PREVENT FALLS:  Life alert? No  Use of a cane, walker or w/c? No  Grab bars in the bathroom? Yes  Shower chair or bench in shower? Yes  Elevated toilet seat or a handicapped toilet? Yes   TIMED UP AND GO:  Was the test performed? Yes .  Length of time to ambulate 10 feet: 8 sec.   Gait steady and fast without use of assistive device  Cognitive Function: Normal cognitive status assessed by direct observation by this Nurse Health Advisor. No abnormalities found.          Immunizations Immunization History  Administered Date(s) Administered   Fluad Quad(high Dose 65+) 01/02/2019, 12/25/2019, 11/24/2020   Influenza Split 12/16/2011   Influenza Whole 03/02/2007, 12/23/2008, 10/30/2009   Influenza, High Dose Seasonal PF 01/24/2013, 02/08/2016, 12/16/2016, 01/25/2018   Influenza,inj,Quad PF,6+ Mos  11/29/2013, 12/02/2014   Moderna SARS-COV2 Booster Vaccination 03/10/2020   PFIZER(Purple Top)SARS-COV-2 Vaccination 04/10/2019, 05/21/2019   Pneumococcal Conjugate-13 06/03/2013   Pneumococcal Polysaccharide-23 10/30/2009, 02/08/2016   Td 03/20/2012    TDAP status: Up to date  Flu Vaccine status: Up to date  Pneumococcal vaccine status: Up to date  Covid-19 vaccine status: Completed vaccines  Qualifies for Shingles Vaccine? Yes   Zostavax completed No   Shingrix Completed?: No.    Education has been provided regarding the importance of this vaccine. Patient has been advised to call insurance company to determine out of pocket expense if they have not yet received this vaccine. Advised may also receive vaccine at local pharmacy or Health Dept. Verbalized acceptance and understanding.  Screening Tests Health Maintenance  Topic Date Due   Hepatitis C Screening  Never done   Zoster Vaccines- Shingrix (1 of 2) Never done   OPHTHALMOLOGY EXAM  02/22/2020   COVID-19 Vaccine (3 - Booster for Pfizer series) 05/05/2020   HEMOGLOBIN A1C  09/28/2021   FOOT EXAM  12/28/2021   TETANUS/TDAP  03/20/2022   Pneumonia Vaccine 16+ Years old  Completed   INFLUENZA VACCINE  Completed   HPV VACCINES  Aged Out   COLONOSCOPY (Pts 45-43yrs Insurance coverage will need to be confirmed)  Discontinued    Health Maintenance  Health Maintenance Due  Topic Date Due   Hepatitis C Screening  Never done   Zoster Vaccines- Shingrix (1 of 2) Never done  OPHTHALMOLOGY EXAM  02/22/2020   COVID-19 Vaccine (3 - Booster for Pfizer series) 05/05/2020    Colorectal cancer screening: No longer required.   Lung Cancer Screening: (Low Dose CT Chest recommended if Age 34-80 years, 30 pack-year currently smoking OR have quit w/in 15years.) does not qualify.   Lung Cancer Screening Referral: no  Additional Screening:  Hepatitis C Screening: does qualify; Completed no  Vision Screening: Recommended annual  ophthalmology exams for early detection of glaucoma and other disorders of the eye. Is the patient up to date with their annual eye exam?  Yes  Who is the provider or what is the name of the office in which the patient attends annual eye exams? [facility] If pt is not established with a provider, would they like to be referred to a provider to establish care? No .   Dental Screening: Recommended annual dental exams for proper oral hygiene  Community Resource Referral / Chronic Care Management: CRR required this visit?  No   CCM required this visit?  No      Plan:     I have personally reviewed and noted the following in the patients chart:   Medical and social history Use of alcohol, tobacco or illicit drugs  Current medications and supplements including opioid prescriptions. Patient is not currently taking opioid prescriptions. Functional ability and status Nutritional status Physical activity Advanced directives List of other physicians Hospitalizations, surgeries, and ER visits in previous 12 months Vitals Screenings to include cognitive, depression, and falls Referrals and appointments  In addition, I have reviewed and discussed with patient certain preventive protocols, quality metrics, and best practice recommendations. A written personalized care plan for preventive services as well as general preventive health recommendations were provided to patient.     Sheral Flow, LPN   [id number]/[id number]   Nurse Notes:  Hearing Screening - Comments:: Patient denied any hearing difficulty.   No hearing aids.  Vision Screening - Comments:: Patient wears corrective glasses/contacts.   Eye exam done annually by: [place] screening examination/treatment/procedure(s) were performed by non-physician practitioner and as supervising physician I was immediately available for consultation/collaboration.  I agree with above. Lew Dawes, MD

## 2021-05-03 NOTE — Assessment & Plan Note (Signed)
Better  

## 2021-05-03 NOTE — Assessment & Plan Note (Signed)
Chronic Doing well

## 2021-05-03 NOTE — Progress Notes (Signed)
Subjective:  Patient ID: Johnny [name], male    DOB: 1945/10/14  Age: 76 y.o. MRN: [id number]  CC: No chief complaint on file.   HPI Johnny [name] presents for DM, HTN, CAD  Outpatient Medications Prior to Visit  Medication Sig Dispense Refill   albuterol (VENTOLIN HFA) 108 (90 Base) MCG/ACT inhaler INHALE 2 PUFFS INTO THE LUNGS EVERY 4 HOURS AS NEEDED FOR WHEEZING OR SHORTNESS OF BREATH 18 g 5   Alcohol Swabs (B-D SINGLE USE SWABS REGULAR) PADS 1 each by Does not apply route 2 (two) times daily as needed. Dx: 250.00 100 each 3   allopurinol (ZYLOPRIM) 100 MG tablet TAKE 1 TABLET EVERY DAY 90 tablet 3   aspirin 81 MG EC tablet Take 2 tablets (162 mg total) by mouth daily. Swallow whole. 100 tablet 3   Blood Glucose Calibration (ACCU-CHEK AVIVA) SOLN Use as directed as needed. (Patient taking differently: Check BID) 1 each 2   bromocriptine (PARLODEL) 2.5 MG tablet Take 1 tablet (2.5 mg total) by mouth daily. 90 tablet 3   cholecalciferol (VITAMIN D) 1000 UNITS tablet Take 1,000 Units by mouth every morning.     colchicine 0.6 MG tablet Take 1 tablet (0.6 mg total) by mouth daily. 90 tablet 3   finasteride (PROSCAR) 5 MG tablet TAKE 1 TABLET (5 MG TOTAL) BY MOUTH EVERY MORNING. 90 tablet 3   fluticasone furoate-vilanterol (BREO ELLIPTA) 100-25 MCG/INH AEPB Inhale 1 puff into the lungs daily. 1 each 5   irbesartan-hydrochlorothiazide (AVALIDE) 300-12.5 MG tablet TAKE 1 TABLET EVERY DAY 90 tablet 3   Lancet Devices (ACCU-CHEK SOFTCLIX) lancets Use twice daily as instructed. Dx: 250.00 1 each 3   Lancets (ONETOUCH DELICA PLUS [id number]) MISC USE TWICE DAILY AS DIRECTED 200 each 3   lovastatin (MEVACOR) 40 MG tablet TAKE 1 TABLET EVERY MORNING 90 tablet 3   metFORMIN (GLUCOPHAGE) 500 MG tablet Take 1 tablet by mouth once daily with breakfast 90 tablet 0   Multiple Vitamins-Minerals (MULTI-VITAMIN GUMMIES PO) Take 1 tablet by mouth daily.     nitroGLYCERIN (NITROSTAT) 0.4 MG SL tablet  Place 1 tablet (0.4 mg total) under the tongue every 5 (five) minutes as needed for chest pain. 25 tablet 3   ONETOUCH ULTRA test strip USE TWICE DAILY AS  INSTRUCTED 200 strip 3   pantoprazole (PROTONIX) 40 MG tablet TAKE 1 TABLET EVERY MORNING 90 tablet 3   repaglinide (PRANDIN) 2 MG tablet Take 1 tablet (2 mg total) by mouth 2 (two) times daily before a meal. 180 tablet 3   Semaglutide (RYBELSUS) 7 MG TABS Take 7 mg by mouth every morning. 30 tablet 11   tamsulosin (FLOMAX) 0.4 MG CAPS capsule TAKE 1 CAPSULE (0.4 MG TOTAL) BY MOUTH DAILY. 90 capsule 3   No facility-administered medications prior to visit.    ROS: Review of Systems  Constitutional:  Negative for appetite change, fatigue and unexpected weight change.  HENT:  Negative for congestion, nosebleeds, sneezing, sore throat and trouble swallowing.   Eyes:  Negative for itching and visual disturbance.  Respiratory:  Negative for cough.   Cardiovascular:  Negative for chest pain, palpitations and leg swelling.  Gastrointestinal:  Negative for abdominal distention, blood in stool, diarrhea and nausea.  Genitourinary:  Negative for frequency and hematuria.  Musculoskeletal:  Positive for arthralgias. Negative for back pain, gait problem, joint swelling and neck pain.  Skin:  Negative for rash.  Neurological:  Negative for dizziness, tremors, speech difficulty and weakness.  Psychiatric/Behavioral:  Negative for agitation, dysphoric mood, sleep disturbance and suicidal ideas. The patient is not nervous/anxious.    Objective:  BP 122/60 (BP Location: Left Arm, Patient Position: Sitting, Cuff Size: Large)    Pulse 71    Temp 97.7 F (36.5 C) (Oral)    Ht 5\' 9"  (1.753 m)    Wt 223 lb (101.2 kg)    SpO2 98%    BMI 32.93 kg/m   BP Readings from Last 3 Encounters:  05/03/21 122/60  05/03/21 122/60  03/31/21 (!) 120/56    Wt Readings from Last 3 Encounters:  05/03/21 223 lb (101.2 kg)  05/03/21 223 lb 6.4 oz (101.3 kg)  03/31/21  220 lb (99.8 kg)    Physical Exam Constitutional:      General: He is not in acute distress.    Appearance: He is well-developed.     Comments: NAD  Eyes:     Conjunctiva/sclera: Conjunctivae normal.     Pupils: Pupils are equal, round, and reactive to light.  Neck:     Thyroid: No thyromegaly.     Vascular: No JVD.  Cardiovascular:     Rate and Rhythm: Normal rate and regular rhythm.     Heart sounds: Normal heart sounds. No murmur heard.   No friction rub. No gallop.  Pulmonary:     Effort: Pulmonary effort is normal. No respiratory distress.     Breath sounds: Normal breath sounds. No wheezing or rales.  Chest:     Chest [place]: No tenderness.  Abdominal:     General: Bowel sounds are normal. There is no distension.     Palpations: Abdomen is soft. There is no mass.     Tenderness: There is no abdominal tenderness. There is no guarding or rebound.  Musculoskeletal:        General: No tenderness. Normal range of motion.     Cervical back: Normal range of motion.  Lymphadenopathy:     Cervical: No cervical adenopathy.  Skin:    General: Skin is warm and dry.     Findings: No rash.  Neurological:     Mental Status: He is alert and oriented to person, place, and time.     Cranial Nerves: No cranial nerve deficit.     Motor: No abnormal muscle tone.     Coordination: Coordination normal.     Gait: Gait normal.     Deep Tendon Reflexes: Reflexes are normal and symmetric.  Psychiatric:        Behavior: Behavior normal.        Thought Content: Thought content normal.        Judgment: Judgment normal.  P declined  Lab Results  Component Value Date   WBC 7.1 12/16/2019   HGB 12.4 (L) 12/16/2019   HCT 36.5 (L) 12/16/2019   PLT 230.0 12/16/2019   GLUCOSE 79 07/27/2020   CHOL 139 07/27/2020   TRIG 206.0 (H) 07/27/2020   HDL 28.90 (L) 07/27/2020   LDLDIRECT 83.0 07/27/2020   LDLCALC 74 11/29/2013   ALT 12 07/27/2020   AST 13 07/27/2020   NA 139 07/27/2020   K 4.5  07/27/2020   CL 105 07/27/2020   CREATININE 1.49 07/27/2020   BUN 29 (H) 07/27/2020   CO2 24 07/27/2020   TSH 3.59 10/14/2010   PSA 1.0 11/14/2019   INR 1.15 01/21/2013   HGBA1C 8.8 (A) 03/31/2021   MICROALBUR <0.7 09/09/2016    VAS [place] LOWER EXTREMITY VENOUS (DVT)  Result Date:  12/17/2019  Lower Venous DVTStudy Indications: Right calf pain/ tightness.  Comparison Study: none Performing Technologist: June Leap RDMS, RVT  Examination Guidelines: A complete evaluation includes B-mode imaging, spectral Doppler, color Doppler, and power Doppler as needed of all accessible portions of each vessel. Bilateral testing is considered an integral part of a complete examination. Limited examinations for reoccurring indications may be performed as noted. The reflux portion of the exam is performed with the patient in reverse Trendelenburg.  +-----+---------------+---------+-----------+----------+--------------+  RIGHT Compressibility Phasicity Spontaneity Properties Thrombus Aging  +-----+---------------+---------+-----------+----------+--------------+  CFV   Full            Yes       Yes                                    +-----+---------------+---------+-----------+----------+--------------+   +---------+---------------+---------+-----------+----------+--------------+  LEFT      Compressibility Phasicity Spontaneity Properties Thrombus Aging  +---------+---------------+---------+-----------+----------+--------------+  CFV       Full            Yes       Yes                                    +---------+---------------+---------+-----------+----------+--------------+  SFJ       Full                                                             +---------+---------------+---------+-----------+----------+--------------+  FV Prox   Full                                                             +---------+---------------+---------+-----------+----------+--------------+  FV Mid    Partial         Yes       Yes                     Acute           +---------+---------------+---------+-----------+----------+--------------+  FV Distal None            No        No                     Acute           +---------+---------------+---------+-----------+----------+--------------+  PFV       Full                                                             +---------+---------------+---------+-----------+----------+--------------+  POP       None            No        No  Acute           +---------+---------------+---------+-----------+----------+--------------+  PTV       None                                             Acute           +---------+---------------+---------+-----------+----------+--------------+  PERO      None                                             Acute           +---------+---------------+---------+-----------+----------+--------------+  Gastroc   None                                             Acute           +---------+---------------+---------+-----------+----------+--------------+  GSV       Full                                                             +---------+---------------+---------+-----------+----------+--------------+    Findings reported to [facility] @ Dr. Ronnald Ramp' office at 3:18 pm.  Summary: RIGHT: - No evidence of common femoral vein obstruction.  LEFT: - Findings consistent with acute deep vein thrombosis involving the left femoral vein, left popliteal vein, left posterior tibial veins, left peroneal veins, and left gastrocnemius veins.  *See table(s) above for measurements and observations. Electronically signed by Monica Martinez MD on 12/17/2019 at 4:40:56 PM.    Final     Assessment & Plan:   Problem List Items Addressed This Visit     BLADDER OUTLET OBSTRUCTION   Relevant Orders   PSA   DM type 2 causing vascular disease ([place])    Dr Loanne Drilling Chronic Metformin, Prandin, Bromocriptine; Irbesartan HCT and Lovastatin      Relevant Orders   Uric acid   Comprehensive  metabolic panel   PSA   TSH   Essential hypertension - Primary   GERD    Chronic Doing well       Gout attack    Better      Relevant Orders   Uric acid   Other Visit Diagnoses     Dyslipidemia       Relevant Orders   TSH         No orders of the defined types were placed in this encounter.     Follow-up: Return in about 4 months (around 08/31/2021) for a follow-up visit.  Walker Kehr, MD

## 2021-05-04 LAB — TSH: TSH: 7.02 u[IU]/mL — ABNORMAL HIGH (ref 0.35–5.50)

## 2021-05-04 LAB — PSA: PSA: 1.38 ng/mL (ref 0.10–4.00)

## 2021-05-06 ENCOUNTER — Other Ambulatory Visit (INDEPENDENT_AMBULATORY_CARE_PROVIDER_SITE_OTHER): Payer: Medicare HMO

## 2021-05-06 DIAGNOSIS — Z0000 Encounter for general adult medical examination without abnormal findings: Secondary | ICD-10-CM | POA: Diagnosis not present

## 2021-05-06 LAB — T4, FREE: Free T4: 0.73 ng/dL (ref 0.60–1.60)

## 2021-05-31 ENCOUNTER — Ambulatory Visit: Payer: Medicare HMO | Admitting: Endocrinology

## 2021-06-01 ENCOUNTER — Other Ambulatory Visit: Payer: Self-pay

## 2021-06-01 ENCOUNTER — Encounter: Payer: Self-pay | Admitting: Endocrinology

## 2021-06-01 ENCOUNTER — Ambulatory Visit: Payer: Medicare HMO | Admitting: Endocrinology

## 2021-06-01 VITALS — BP 110/68 | HR 70 | Ht 69.0 in | Wt 223.4 lb

## 2021-06-01 DIAGNOSIS — E1159 Type 2 diabetes mellitus with other circulatory complications: Secondary | ICD-10-CM

## 2021-06-01 DIAGNOSIS — I509 Heart failure, unspecified: Secondary | ICD-10-CM | POA: Diagnosis not present

## 2021-06-01 LAB — POCT GLYCOSYLATED HEMOGLOBIN (HGB A1C): Hemoglobin A1C: 7.5 % — AB (ref 4.0–5.6)

## 2021-06-01 NOTE — Progress Notes (Signed)
? ?Subjective:  ? ? Patient ID: Johnny [name], male    DOB: 1945-10-24, 76 y.o.   MRN: [id number] ? ?HPI ?Pt returns for f/u of diabetes mellitus:  ?DM type: 2 ?Dx'ed: 1999 ?Complications: CAD, PN, and stage 3 CRI.   ?Therapy: 4 oral meds.  ?DKA: never ?Severe hypoglycemia: never ?Pancreatitis: never ?Pancreatic imaging: never.   ?SDOH: he cannot afford name brand meds.   ?Other: he has never been on insulin, except in the hospital, but he has learned how to take.  edema and CRI limit rx options and dosages.   ?Interval history: pt states he feels well in general.  He says cbg's vary from 90-150.  he says he never misses meds.   ?Past Medical History:  ?Diagnosis Date  ? Arthritis   ? At risk for sleep apnea   ? STOP-BANG=  6   SENT TO PCP 05-21-2013  ? Benign positional vertigo   ? Benign prostatic hypertrophy   ? Blood transfusion without reported diagnosis 2000  ? CAD (coronary artery disease)   ? CARDIOLOGIST--  DR Cristopher Peru  ? CHF (congestive heart failure) ([place])   ? Chronic ischemic heart disease, unspecified   ? ED (erectile dysfunction) of organic origin   ? GERD (gastroesophageal reflux disease)   ? History of CHF (congestive heart failure)   ? systolic  ? HTN (hypertension)   ? Hyperlipidemia   ? Tubular adenoma of colon 12/2009  ? Type 2 diabetes mellitus ([place])   ? Wears glasses   ? ? ?Past Surgical History:  ?Procedure Laterality Date  ? CARDIAC CATHETERIZATION  03-03-2000  dr gregg taylor  ? normal lvsf/  two-vessel cad significant complex stenosis at distal left main  ? CARDIOVASCULAR STRESS TEST  08-09-2010  DR Cristopher Peru  ? normal lexiscan nuclear study/  no ischemia/  normal lvf/  ef 54%  ? CATARACT EXTRACTION W/ INTRAOCULAR LENS IMPLANT Right 03/07/2009  ? COLONOSCOPY  10/09/2020  ? 12/30/2009  ? CORONARY ARTERY BYPASS GRAFT  03-06-2000   DR DXAJOINO  ? third vessel  ? PENILE PROSTHESIS IMPLANT  02/08/2010  ? COLOPLAST 3-PIECE INFLATABLE  ? PENILE PROSTHESIS IMPLANT N/A 05/27/2013  ?  Procedure: CYSTO REMOVAL OF PENILE PROSTHESIS;  Surgeon: Claybon Jabs, MD;  [facility];  Service: Urology;  Laterality: N/A;  ? POLYPECTOMY  12/30/2009  ? +TA  ? TOTAL HIP ARTHROPLASTY  01/24/2011  ? Procedure: TOTAL HIP ARTHROPLASTY;  Surgeon: Gearlean Alf;  Location: [facility];  Service: Orthopedics;  Laterality: Left;  ? TOTAL HIP ARTHROPLASTY Right 01/30/2013  ? Procedure: RIGHT TOTAL HIP ARTHROPLASTY;  Surgeon: Gearlean Alf, MD;  Location: [facility];  Service: Orthopedics;  Laterality: Right;  ? ? ?Social History  ? ?Socioeconomic History  ? Marital status: Married  ?  Spouse name: Not on file  ? Number of children: 2  ? Years of education: Not on file  ? Highest education level: Not on file  ?Occupational History  ? Occupation: Environmental education officer  ?  Employer: RETIRED  ?Tobacco Use  ? Smoking status: Former  ?  Types: Cigarettes  ?  Quit date: 05/22/1970  ?  Years since quitting: 51.0  ? Smokeless tobacco: Never  ?Vaping Use  ? Vaping Use: Never used  ?Substance and Sexual Activity  ? Alcohol use: No  ?  Comment: seldom  ? Drug use: No  ? Sexual activity: Not Currently  ?Other Topics Concern  ? Not on file  ?  Social History Narrative  ? 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 Married; has 2 daughters  ? He is a [employer]  ? ?Social Determinants of Health  ? ?Financial Resource Strain: Low Risk   ? Difficulty of Paying Living Expenses: Not hard at all  ?Food Insecurity: No Food Insecurity  ? Worried About Charity fundraiser in the Last Year: Never true  ? Ran Out of Food in the Last Year: Never true  ?Transportation Needs: No Transportation Needs  ? Lack of Transportation (Medical): No  ? Lack of Transportation (Non-Medical): No  ?Physical Activity: Sufficiently Active  ? Days of Exercise per Week: 5 days  ? Minutes of Exercise per Session: 50 min  ?Stress: No Stress Concern Present  ? Feeling of Stress : Not at all  ?Social Connections: Socially  Integrated  ? Frequency of Communication with Friends and Family: More than three times a week  ? Frequency of Social Gatherings with Friends and Family: More than three times a week  ? Attends Religious Services: More than 4 times per year  ? Active Member of Clubs or Organizations: Yes  ? Attends Archivist Meetings: More than 4 times per year  ? Marital Status: Married  ?Intimate Partner Violence: Not At Risk  ? Fear of Current or Ex-Partner: No  ? Emotionally Abused: No  ? Physically Abused: No  ? Sexually Abused: No  ? ? ?Current Outpatient Medications on File Prior to Visit  ?Medication Sig Dispense Refill  ? albuterol (VENTOLIN HFA) 108 (90 Base) MCG/ACT inhaler INHALE 2 PUFFS INTO THE LUNGS EVERY 4 HOURS AS NEEDED FOR WHEEZING OR SHORTNESS OF BREATH 18 g 5  ? Alcohol Swabs (B-D SINGLE USE SWABS REGULAR) PADS 1 each by Does not apply route 2 (two) times daily as needed. Dx: 250.00 100 each 3  ? allopurinol (ZYLOPRIM) 100 MG tablet TAKE 1 TABLET EVERY DAY 90 tablet 3  ? aspirin 81 MG EC tablet Take 2 tablets (162 mg total) by mouth daily. Swallow whole. 100 tablet 3  ? Blood Glucose Calibration (ACCU-CHEK AVIVA) SOLN Use as directed as needed. (Patient taking differently: Check BID) 1 each 2  ? bromocriptine (PARLODEL) 2.5 MG tablet Take 1 tablet (2.5 mg total) by mouth daily. 90 tablet 3  ? cholecalciferol (VITAMIN D) 1000 UNITS tablet Take 1,000 Units by mouth every morning.    ? colchicine 0.6 MG tablet Take 1 tablet (0.6 mg total) by mouth daily. 90 tablet 3  ? finasteride (PROSCAR) 5 MG tablet TAKE 1 TABLET (5 MG TOTAL) BY MOUTH EVERY MORNING. 90 tablet 3  ? fluticasone furoate-vilanterol (BREO ELLIPTA) 100-25 MCG/INH AEPB Inhale 1 puff into the lungs daily. 1 each 5  ? irbesartan-hydrochlorothiazide (AVALIDE) 300-12.5 MG tablet TAKE 1 TABLET EVERY DAY 90 tablet 3  ? Lancet Devices (ACCU-CHEK SOFTCLIX) lancets Use twice daily as instructed. Dx: 250.00 1 each 3  ? Lancets (ONETOUCH DELICA PLUS  [id number]) MISC USE TWICE DAILY AS DIRECTED 200 each 3  ? lovastatin (MEVACOR) 40 MG tablet TAKE 1 TABLET EVERY MORNING 90 tablet 3  ? metFORMIN (GLUCOPHAGE) 500 MG tablet Take 1 tablet by mouth once daily with breakfast 90 tablet 0  ? Multiple Vitamins-Minerals (MULTI-VITAMIN GUMMIES PO) Take 1 tablet by mouth daily.    ? nitroGLYCERIN (NITROSTAT) 0.4 MG SL tablet Place 1 tablet (0.4 mg total) under the tongue every 5 (five) minutes as needed for chest pain. 25  tablet 3  ? ONETOUCH ULTRA test strip USE TWICE DAILY AS  INSTRUCTED 200 strip 3  ? pantoprazole (PROTONIX) 40 MG tablet TAKE 1 TABLET EVERY MORNING 90 tablet 3  ? repaglinide (PRANDIN) 2 MG tablet Take 1 tablet (2 mg total) by mouth 2 (two) times daily before a meal. 180 tablet 3  ? Semaglutide (RYBELSUS) 7 MG TABS Take 7 mg by mouth every morning. 30 tablet 11  ? tamsulosin (FLOMAX) 0.4 MG CAPS capsule TAKE 1 CAPSULE (0.4 MG TOTAL) BY MOUTH DAILY. 90 capsule 3  ? ?No current facility-administered medications on file prior to visit.  ? ? ?Allergies  ?Allergen Reactions  ? Enalapril   ?  SOB  ? Hydrocodone-Acetaminophen Nausea Only  ?  Can take codein  ? ? ?Family History  ?Problem Relation Age of Onset  ? Hypertension Mother   ? Diabetes Father   ? Cancer Sister 72  ?     ovarian ca  ? Cancer - Other Brother   ?     cancer all over  ? Coronary artery disease Other   ?     1st degree male relative  ? Hypertension Other   ? Colon cancer Neg Hx   ? Stomach cancer Neg Hx   ? Esophageal cancer Neg Hx   ? Pancreatic cancer Neg Hx   ? Liver disease Neg Hx   ? Colon polyps Neg Hx   ? Rectal cancer Neg Hx   ? ? ?BP 110/68 (BP Location: Left Arm, Patient Position: Sitting, Cuff Size: Normal)   Pulse 70   Ht '5\' 9"'$  (1.753 m)   Wt 223 lb 6.4 oz (101.3 kg)   SpO2 99%   BMI 32.99 kg/m?  ? ? ?Review of Systems ?Denies N/HB ?   ?Objective:  ? Physical Exam ?VITAL SIGNS:  See vs page.   ?GENERAL: no distress.  ? ? ? ?A1c=7.5% ?   ?Assessment & Plan:  ?Type 2 DM:  uncontrolled.  I advised pt to increase Rybelsus, but he declines.  ? ?Patient Instructions  ?Please continue the same 4 diabetes medications.   ?check your blood sugar once a day.  vary the time of day when you check, between

## 2021-06-01 NOTE — Patient Instructions (Addendum)
Please continue the same 4 diabetes medications.   ?check your blood sugar once a day.  vary the time of day when you check, between before the 3 meals, and at bedtime.  also check if you have symptoms of your blood sugar being too high or too low.  please keep a record of the readings and bring it to your next appointment here (or you can bring the meter itself).  You can write it on any piece of paper.  please call us sooner if your blood sugar goes below 70, or if you have a lot of readings over 200.    ?Please come back for a follow-up appointment in 4 months.    ?

## 2021-08-03 ENCOUNTER — Ambulatory Visit: Payer: Medicare HMO | Admitting: Internal Medicine

## 2021-08-13 ENCOUNTER — Encounter: Payer: Self-pay | Admitting: Internal Medicine

## 2021-08-13 ENCOUNTER — Ambulatory Visit: Payer: Medicare HMO | Admitting: Internal Medicine

## 2021-08-13 VITALS — BP 120/68 | HR 55 | Ht 69.0 in | Wt 225.4 lb

## 2021-08-13 DIAGNOSIS — E119 Type 2 diabetes mellitus without complications: Secondary | ICD-10-CM | POA: Insufficient documentation

## 2021-08-13 DIAGNOSIS — E785 Hyperlipidemia, unspecified: Secondary | ICD-10-CM | POA: Diagnosis not present

## 2021-08-13 DIAGNOSIS — E1142 Type 2 diabetes mellitus with diabetic polyneuropathy: Secondary | ICD-10-CM | POA: Diagnosis not present

## 2021-08-13 DIAGNOSIS — N1832 Chronic kidney disease, stage 3b: Secondary | ICD-10-CM | POA: Insufficient documentation

## 2021-08-13 DIAGNOSIS — E1122 Type 2 diabetes mellitus with diabetic chronic kidney disease: Secondary | ICD-10-CM | POA: Diagnosis not present

## 2021-08-13 DIAGNOSIS — E1159 Type 2 diabetes mellitus with other circulatory complications: Secondary | ICD-10-CM | POA: Diagnosis not present

## 2021-08-13 LAB — POCT GLYCOSYLATED HEMOGLOBIN (HGB A1C): Hemoglobin A1C: 7 % — AB (ref 4.0–5.6)

## 2021-08-13 MED ORDER — METFORMIN HCL 500 MG PO TABS: 500.0000 mg | ORAL_TABLET | Freq: Every day | ORAL | 3 refills | Status: DC

## 2021-08-13 MED ORDER — DAPAGLIFLOZIN PROPANEDIOL 5 MG PO TABS: 5.0000 mg | ORAL_TABLET | Freq: Every day | ORAL | 3 refills | Status: DC

## 2021-08-13 MED ORDER — RYBELSUS 14 MG PO TABS: 14.0000 mg | ORAL_TABLET | Freq: Every day | ORAL | 3 refills | Status: DC

## 2021-08-13 MED ORDER — EMPAGLIFLOZIN 10 MG PO TABS: 10.0000 mg | ORAL_TABLET | Freq: Every day | ORAL | 3 refills | Status: DC

## 2021-08-13 NOTE — Patient Instructions (Addendum)
-   STOP Bromocriptine  - STOP Repaglinide  - Continue metformin 500 mg 1 tablet daily  - Increase Rybelsus 14 mg , 1 tablet daily  - Check the prescription for Jardiance/ Farxiga  1 tablet daily ( if the cost is doable - get it )    - Check sugars daily , if consistently over 180 mg/dL start Replaglinide 2 mg , ONE tablet before Supper      HOW TO TREAT LOW BLOOD SUGARS (Blood sugar LESS THAN 70 MG/DL) Please follow the RULE OF 15 for the treatment of hypoglycemia treatment (when your (blood sugars are less than 70 mg/dL)   STEP 1: Take 15 grams of carbohydrates when your blood sugar is low, which includes:  3-4 GLUCOSE TABS  OR 3-4 OZ OF JUICE OR REGULAR SODA OR ONE TUBE OF GLUCOSE GEL    STEP 2: RECHECK blood sugar in 15 MINUTES STEP 3: If your blood sugar is still low at the 15 minute recheck --> then, go back to STEP 1 and treat AGAIN with another 15 grams of carbohydrates.

## 2021-08-13 NOTE — Progress Notes (Signed)
Name: Johnny [name]  Age/ Sex: 76 y.o., male   MRN/ DOB: [id number], [birth year]     PCP: Cassandria Anger, MD   Reason for Endocrinology Evaluation: Type 2 Diabetes Mellitus  Initial Endocrine Consultative Visit: 09/15/2016    PATIENT IDENTIFIER: Johnny [name] is a 76 y.o. male with a past medical history of T2DM, CAD, HTN and dyslipidemia . The patient has followed with Endocrinology clinic since 09/15/2016 for consultative assistance with management of his diabetes.  DIABETIC HISTORY:  Johnny [name] was diagnosed with DM 1999, has been on oral glycemic agents since his dx . His hemoglobin A1c has ranged from 6.9% in 2021, peaking at 9.3% in 2022.  Transitioned from Dr. Loanne Drilling 08/2021   SUBJECTIVE:   During the last visit (06/01/2021): Saw Dr. Loanne Drilling   Today (08/13/2021): Johnny [name] is here for a follow up on diabetes management.  He is accompanied by his spouse today. He checks his blood sugars 1 times daily. The patient has not had hypoglycemic episodes since the last clinic visit.  He did not bring his meter today   Denies nausea, vomiting or diarrhea   He does not been on repaglinide in a while, he is not sure why he is not on it, no intolerance   HOME DIABETES REGIMEN:  Metformin 500 mg daily  Repaglinide 2 mg , 2 tabs BID - not taking Bromocriptine 2.5 mg  Rybelsus 7 mg daily       Statin: yes ACE-I/ARB: yes    METER DOWNLOAD SUMMARY: did not bring   DIABETIC COMPLICATIONS: Microvascular complications:  CKD III, neuropathy Denies: retinopathy Last Eye Exam: Completed 01/2021  Macrovascular complications:  CAD ( S/P CABG ) Denies:  CVA, PVD   HISTORY:  Past Medical History:  Past Medical History:  Diagnosis Date   Arthritis    At risk for sleep apnea    STOP-BANG=  6   SENT TO PCP 05-21-2013   Benign positional vertigo    Benign prostatic hypertrophy    Blood transfusion without reported diagnosis 2000   CAD (coronary artery disease)     CARDIOLOGIST--  DR Cristopher Peru   CHF (congestive heart failure) (HCC)    Chronic ischemic heart disease, unspecified    ED (erectile dysfunction) of organic origin    GERD (gastroesophageal reflux disease)    History of CHF (congestive heart failure)    systolic   HTN (hypertension)    Hyperlipidemia    Tubular adenoma of colon 12/2009   Type 2 diabetes mellitus ([place] [place])    Wears glasses    Past Surgical History:  Past Surgical History:  Procedure Laterality Date   CARDIAC CATHETERIZATION  03-03-2000  dr Carleene Overlie taylor   normal lvsf/  two-vessel cad significant complex stenosis at distal left main   CARDIOVASCULAR STRESS TEST  08-09-2010  DR GREGG TAYLOR   normal lexiscan nuclear study/  no ischemia/  normal lvf/  ef 54%   CATARACT EXTRACTION W/ INTRAOCULAR LENS IMPLANT Right 03/07/2009   COLONOSCOPY  10/09/2020   12/30/2009   CORONARY ARTERY BYPASS GRAFT  03-06-2000   DR GERHARDT   third vessel   PENILE PROSTHESIS IMPLANT  02/08/2010   COLOPLAST 3-PIECE INFLATABLE   PENILE PROSTHESIS IMPLANT N/A 05/27/2013   Procedure: CYSTO REMOVAL OF PENILE PROSTHESIS;  Surgeon: Claybon Jabs, MD;  Location[facility];  Service: Urology;  Laterality: N/A;   POLYPECTOMY  12/30/2009   +TA   TOTAL HIP ARTHROPLASTY  01/24/2011  Procedure: TOTAL HIP ARTHROPLASTY;  Surgeon: Gearlean Alf;  Location: [facility];  Service: Orthopedics;  Laterality: Left;   TOTAL HIP ARTHROPLASTY Right 01/30/2013   Procedure: RIGHT TOTAL HIP ARTHROPLASTY;  Surgeon: Gearlean Alf, MD;  Location: [facility];  Service: Orthopedics;  Laterality: Right;   Social History:  reports that he quit smoking about 51 years ago. His smoking use included cigarettes. He has never used smokeless tobacco. He reports that he does not drink alcohol and does not use drugs. Family History:  Family History  Problem Relation Age of Onset   Hypertension Mother    Diabetes Father    Cancer Sister 51       ovarian ca   Cancer -  Other Brother        cancer all over   Coronary artery disease Other        1st degree male relative   Hypertension Other    Colon cancer Neg Hx    Stomach cancer Neg Hx    Esophageal cancer Neg Hx    Pancreatic cancer Neg Hx    Liver disease Neg Hx    Colon polyps Neg Hx    Rectal cancer Neg Hx      HOME MEDICATIONS: Allergies as of 08/13/2021       Reactions   Enalapril    SOB   Hydrocodone-acetaminophen Nausea Only   Can take codein        Medication List        Accurate as of August 13, 2021  4:19 PM. If you have any questions, ask your nurse or doctor.          STOP taking these medications    bromocriptine 2.5 MG tablet Commonly known as: PARLODEL Stopped by: Dorita Sciara, MD   repaglinide 2 MG tablet Commonly known as: PRANDIN Stopped by: Dorita Sciara, MD       TAKE these medications    Accu-Chek Aviva Soln Use as directed as needed. What changed: additional instructions   accu-chek softclix lancets Use twice daily as instructed. Dx: 250.00   albuterol 108 (90 Base) MCG/ACT inhaler Commonly known as: Ventolin HFA INHALE 2 PUFFS INTO THE LUNGS EVERY 4 HOURS AS NEEDED FOR WHEEZING OR SHORTNESS OF BREATH   allopurinol 100 MG tablet Commonly known as: ZYLOPRIM TAKE 1 TABLET EVERY DAY   aspirin EC 81 MG tablet Take 2 tablets (162 mg total) by mouth daily. Swallow whole.   B-D SINGLE USE SWABS REGULAR Pads 1 each by Does not apply route 2 (two) times daily as needed. Dx: 250.00   cholecalciferol 1000 units tablet Commonly known as: VITAMIN D Take 1,000 Units by mouth every morning.   colchicine 0.6 MG tablet Take 1 tablet (0.6 mg total) by mouth daily.   empagliflozin 10 MG Tabs tablet Commonly known as: Jardiance Take 1 tablet (10 mg total) by mouth daily before breakfast. Started by: Dorita Sciara, MD   finasteride 5 MG tablet Commonly known as: PROSCAR TAKE 1 TABLET (5 MG TOTAL) BY MOUTH EVERY MORNING.    fluticasone furoate-vilanterol 100-25 MCG/INH Aepb Commonly known as: Breo Ellipta Inhale 1 puff into the lungs daily.   irbesartan-hydrochlorothiazide 300-12.5 MG tablet Commonly known as: AVALIDE TAKE 1 TABLET EVERY DAY   lovastatin 40 MG tablet Commonly known as: MEVACOR TAKE 1 TABLET EVERY MORNING   metFORMIN 500 MG tablet Commonly known as: GLUCOPHAGE Take 1 tablet (500 mg total) by mouth daily with breakfast.  MULTI-VITAMIN GUMMIES PO Take 1 tablet by mouth daily.   nitroGLYCERIN 0.4 MG SL tablet Commonly known as: NITROSTAT Place 1 tablet (0.4 mg total) under the tongue every 5 (five) minutes as needed for chest pain.   OneTouch Delica Plus [id number] Misc USE TWICE DAILY AS DIRECTED   OneTouch Ultra test strip Generic drug: glucose blood USE TWICE DAILY AS  INSTRUCTED   pantoprazole 40 MG tablet Commonly known as: PROTONIX TAKE 1 TABLET EVERY MORNING   Rybelsus 14 MG Tabs Generic drug: Semaglutide Take 1 tablet (14 mg total) by mouth daily. What changed:  medication strength how much to take when to take this Changed by: Dorita Sciara, MD   tamsulosin 0.4 MG Caps capsule Commonly known as: FLOMAX TAKE 1 CAPSULE (0.4 MG TOTAL) BY MOUTH DAILY.         OBJECTIVE:   Vital Signs: BP 120/68 (BP Location: Left Arm, Patient Position: Sitting, Cuff Size: Normal)   Pulse (!) 55   Ht '5\' 9"'$  (1.753 m)   Wt 225 lb 6.4 oz (102.2 kg)   SpO2 99%   BMI 33.29 kg/m   Wt Readings from Last 3 Encounters:  08/13/21 225 lb 6.4 oz (102.2 kg)  06/01/21 223 lb 6.4 oz (101.3 kg)  05/03/21 223 lb (101.2 kg)     Exam: General: Pt appears well and is in NAD  Neck: General: Supple without adenopathy. Thyroid: Thyroid size normal.  No goiter or nodules appreciated.  Lungs: Clear with good BS bilat with no rales, rhonchi, or wheezes  Heart: RRR with normal S1 and S2 and no gallops; no murmurs; no rub  Abdomen: Normoactive bowel sounds, soft, nontender, without  masses or organomegaly palpable  Extremities: No pretibial edema.   Neuro: MS is good with appropriate affect, pt is alert and Ox3    DM foot exam: 08/13/2021     The skin of the feet is intact without sores or ulcerations. The pedal pulses are 2+ on right and 2+ on left. The sensation is intact to a screening 5.07, 10 gram monofilament bilaterally     DATA REVIEWED:  Lab Results  Component Value Date   HGBA1C 7.0 (A) 08/13/2021   HGBA1C 7.5 (A) 06/01/2021   HGBA1C 8.8 (A) 03/31/2021    Latest Reference Range & Units 05/03/21 15:20  Sodium 135 - 145 mEq/L 139  Potassium 3.5 - 5.1 mEq/L 4.6  Chloride 96 - 112 mEq/L 104  CO2 19 - 32 mEq/L 28  Glucose 70 - 99 mg/dL 138 (H)  BUN 6 - 23 mg/dL 25 (H)  Creatinine 0.40 - 1.50 mg/dL 1.56 (H)  Calcium 8.4 - 10.5 mg/dL 9.4  Alkaline Phosphatase 39 - 117 U/L 96  Albumin 3.5 - 5.2 g/dL 4.2  Uric Acid, Serum 4.0 - 7.8 mg/dL 6.9  AST 0 - 37 U/L 13  ALT 0 - 53 U/L 11  Total Protein 6.0 - 8.3 g/dL 6.9  Total Bilirubin 0.2 - 1.2 mg/dL 0.4  GFR >60.00 mL/min 42.87 (L)    Latest Reference Range & Units 07/27/20 15:23  Total CHOL/HDL Ratio  5  Cholesterol 0 - 200 mg/dL 139  HDL Cholesterol >39.00 mg/dL 28.90 (L)  Direct LDL mg/dL 83.0  NonHDL  109.98  Triglycerides 0.0 - 149.0 mg/dL 206.0 (H)  VLDL 0.0 - 40.0 mg/dL 41.2 (H)     ASSESSMENT / PLAN / RECOMMENDATIONS:   1) Type 2 Diabetes Mellitus, Optimally controlled, With neuropathic, CKD iii and macrovascular  complications - Most  recent A1c of 7.0 %. Goal A1c < 7.0 %.     -His A1c is at goal -He did not bring his meter today but prior memory recall his BG was 200 mg/DL, patient admits to dietary indiscretion last night -He has not been on repaglinide, he is not sure why, we have opted to continue to hold off for now and he will continue to check his BG's at home.  If his BG's are persistently >180 mg/dL he was asked to restart 1 tablet of repaglinide before supper only -He is  tolerating Rybelsus will increase this -We will stop the bromocriptine -We discussed benefit of SGLT2 inhibitors, we will check on the price at the pharmacy and if the price is acceptable he will start -Unable to increase metformin due to low GFR  MEDICATIONS: Stop bromocriptine Hold repaglinide (unless BG's remain> 180 mg) then he may start 1 tablet before supper Continue metformin 500 mg 1 tablet daily Increase Rybelsus 14 mg daily We will consider starting Jardiance 10 mg daily  EDUCATION / INSTRUCTIONS: BG monitoring instructions: Patient is instructed to check his blood sugars 1 times a day, fasting. Call [facility] if: BG persistently < 70  I reviewed the Rule of 15 for the treatment of hypoglycemia in detail with the patient. Literature supplied.   2) Diabetic complications:  Eye: Does not have known diabetic retinopathy.  Neuro/ Feet: Does  have known diabetic peripheral neuropathy .  Renal: Patient does have known baseline CKD. He   is  on an ACEI/ARB at present.     3)Dyslipidemia :  -Triglycerides elevated but his LDL is acceptable -Patient assures me compliance with statin therapy    Medication  Continue lovastatin 500 mg daily  F/U in 4 months   Signed electronically by: Mack Guise, MD  [facility] Endocrinology  [address], [place] [address] Phone: [phone] FAX: [phone]   CC: Cassandria Anger, MD [place] [address] Phone: [phone]  Fax: [phone]  Return to Endocrinology clinic as below: Future Appointments  Date Time Provider [place]  09/01/2021  2:20 PM Plotnikov, Evie Lacks, MD [facility] None  12/13/2021  2:00 PM Corbitt Cloke, Melanie Crazier, MD [facility] None

## 2021-08-17 ENCOUNTER — Telehealth: Payer: Self-pay

## 2021-08-17 NOTE — Telephone Encounter (Signed)
Patien's wife left voicemail stating that the new medication cost too much. Tried to call back to verify she's talking about jardiance but no answer. Would like alternate if there is one.

## 2021-08-18 NOTE — Telephone Encounter (Signed)
Patient states that the Rybelsus is $400 and that he is out of it.  He wife will call the insurance to see why it's so much. They think they are in the donut hole. Should  patient just do the Metformin or do you want to send the Repaglinide.

## 2021-08-18 NOTE — Telephone Encounter (Signed)
Vm left for patient to call back.

## 2021-08-20 ENCOUNTER — Other Ambulatory Visit: Payer: Self-pay | Admitting: Internal Medicine

## 2021-08-20 MED ORDER — GLIPIZIDE 5 MG PO TABS: 5.0000 mg | ORAL_TABLET | Freq: Every day | ORAL | 3 refills | Status: DC

## 2021-08-20 NOTE — Telephone Encounter (Signed)
Attempted to call patient no answer left vm to call back.

## 2021-08-20 NOTE — Progress Notes (Signed)
Rybelsus and Jardiance are cost prohibitive at this time, will start Glipizide 5 mg 1 tab daily before Breakfast

## 2021-08-23 NOTE — Telephone Encounter (Signed)
Patient notified and verbalized understanding. 

## 2021-08-23 NOTE — Telephone Encounter (Signed)
LMTCB

## 2021-09-01 ENCOUNTER — Encounter: Payer: Self-pay | Admitting: Internal Medicine

## 2021-09-01 ENCOUNTER — Ambulatory Visit (INDEPENDENT_AMBULATORY_CARE_PROVIDER_SITE_OTHER): Payer: Medicare HMO | Admitting: Internal Medicine

## 2021-09-01 VITALS — BP 158/82 | HR 64 | Temp 97.6°F | Ht 69.0 in | Wt 227.0 lb

## 2021-09-01 DIAGNOSIS — E1159 Type 2 diabetes mellitus with other circulatory complications: Secondary | ICD-10-CM

## 2021-09-01 DIAGNOSIS — N1832 Chronic kidney disease, stage 3b: Secondary | ICD-10-CM | POA: Diagnosis not present

## 2021-09-01 DIAGNOSIS — N320 Bladder-neck obstruction: Secondary | ICD-10-CM

## 2021-09-01 DIAGNOSIS — I10 Essential (primary) hypertension: Secondary | ICD-10-CM | POA: Diagnosis not present

## 2021-09-01 DIAGNOSIS — I2510 Atherosclerotic heart disease of native coronary artery without angina pectoris: Secondary | ICD-10-CM | POA: Diagnosis not present

## 2021-09-01 DIAGNOSIS — E1122 Type 2 diabetes mellitus with diabetic chronic kidney disease: Secondary | ICD-10-CM | POA: Diagnosis not present

## 2021-09-01 NOTE — Assessment & Plan Note (Addendum)
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

## 2021-09-01 NOTE — Progress Notes (Signed)
Subjective:  Patient ID: Johnny [name], male    DOB: Sep 06, 1945  Age: 76 y.o. MRN: [id number]  CC: No chief complaint on file.   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 Dx: 250.00 100 each 3   allopurinol (ZYLOPRIM) 100 MG tablet TAKE 1 TABLET EVERY DAY 90 tablet 3   aspirin 81 MG EC tablet Take 2 tablets (162 mg total) by mouth daily. Swallow whole. 100 tablet 3   Blood Glucose Calibration (ACCU-CHEK AVIVA) SOLN Use as directed as needed. (Patient taking differently: Check BID) 1 each 2   cholecalciferol (VITAMIN D) 1000 UNITS tablet Take 1,000 Units by mouth every morning.     colchicine 0.6 MG tablet Take 1 tablet (0.6 mg total) by mouth daily. 90 tablet 3   finasteride (PROSCAR) 5 MG tablet TAKE 1 TABLET (5 MG TOTAL) BY MOUTH EVERY MORNING. 90 tablet 3   fluticasone furoate-vilanterol (BREO ELLIPTA) 100-25 MCG/INH AEPB Inhale 1 puff into the lungs daily. 1 each 5   glipiZIDE (GLUCOTROL) 5 MG tablet Take 1 tablet (5 mg total) by mouth daily before breakfast. 90 tablet 3   irbesartan-hydrochlorothiazide (AVALIDE) 300-12.5 MG tablet TAKE 1 TABLET EVERY DAY 90 tablet 3   Lancet Devices (ACCU-CHEK SOFTCLIX) lancets Use twice daily as instructed. Dx: 250.00 1 each 3   Lancets (ONETOUCH DELICA PLUS [id number]) MISC USE TWICE DAILY AS DIRECTED 200 each 3   lovastatin (MEVACOR) 40 MG tablet TAKE 1 TABLET EVERY MORNING 90 tablet 3   metFORMIN (GLUCOPHAGE) 500 MG tablet Take 1 tablet (500 mg total) by mouth daily with breakfast. 90 tablet 3   Multiple Vitamins-Minerals (MULTI-VITAMIN GUMMIES PO) Take 1 tablet by  mouth daily.     nitroGLYCERIN (NITROSTAT) 0.4 MG SL tablet Place 1 tablet (0.4 mg total) under the tongue every 5 (five) minutes as needed for chest pain. 25 tablet 3   ONETOUCH ULTRA test strip USE TWICE DAILY AS  INSTRUCTED 200 strip 3   pantoprazole (PROTONIX) 40 MG tablet TAKE 1 TABLET EVERY MORNING 90 tablet 3   tamsulosin (FLOMAX) 0.4 MG CAPS capsule TAKE 1 CAPSULE (0.4 MG TOTAL) BY MOUTH DAILY. 90 capsule 3   No facility-administered medications prior to visit.    ROS: Review of Systems  Constitutional:  Negative for appetite change, fatigue and unexpected weight change.  HENT:  Negative for congestion, nosebleeds, sneezing, sore throat and trouble swallowing.   Eyes:  Negative for itching and visual disturbance.  Respiratory:  Negative for cough.   Cardiovascular:  Negative for chest pain, palpitations and leg swelling.  Gastrointestinal:  Negative for abdominal distention, blood in stool, diarrhea and nausea.  Genitourinary:  Negative for frequency and hematuria.  Musculoskeletal:  Positive for arthralgias. Negative for back pain, gait problem, joint swelling and neck pain.  Skin:  Negative for rash.  Neurological:  Negative for dizziness, tremors, speech difficulty and weakness.  Psychiatric/Behavioral:  Negative for agitation, dysphoric mood, sleep disturbance and suicidal ideas. The patient is not nervous/anxious.     Objective:  BP (!) 158/82 (BP Location: Left Arm,  Patient Position: Sitting, Cuff Size: Normal)   Pulse 64   Temp 97.6 F (36.4 C) (Oral)   Ht '5\' 9"'$  (1.753 m)   Wt 227 lb (103 kg)   SpO2 98%   BMI 33.52 kg/m   BP Readings from Last 3 Encounters:  09/01/21 (!) 158/82  08/13/21 120/68  06/01/21 110/68    Wt Readings from Last 3 Encounters:  09/01/21 227 lb (103 kg)  08/13/21 225 lb 6.4 oz (102.2 kg)  06/01/21 223 lb 6.4 oz (101.3 kg)    Physical Exam Constitutional:      General: He is not in acute distress.    Appearance: He is  well-developed.     Comments: NAD  Eyes:     Conjunctiva/sclera: Conjunctivae normal.     Pupils: Pupils are equal, round, and reactive to light.  Neck:     Thyroid: No thyromegaly.     Vascular: No JVD.  Cardiovascular:     Rate and Rhythm: Normal rate and regular rhythm.     Heart sounds: Normal heart sounds. No murmur heard.    No friction rub. No gallop.  Pulmonary:     Effort: Pulmonary effort is normal. No respiratory distress.     Breath sounds: Normal breath sounds. No wheezing or rales.  Chest:     Chest Headen: No tenderness.  Abdominal:     General: Bowel sounds are normal. There is no distension.     Palpations: Abdomen is soft. There is no mass.     Tenderness: There is no abdominal tenderness. There is no guarding or rebound.  Musculoskeletal:        General: No tenderness. Normal range of motion.     Cervical back: Normal range of motion.  Lymphadenopathy:     Cervical: No cervical adenopathy.  Skin:    General: Skin is warm and dry.     Findings: No rash.  Neurological:     Mental Status: He is alert and oriented to person, place, and time.     Cranial Nerves: No cranial nerve deficit.     Motor: No abnormal muscle tone.     Coordination: Coordination normal.     Gait: Gait normal.     Deep Tendon Reflexes: Reflexes are normal and symmetric.  Psychiatric:        Behavior: Behavior normal.        Thought Content: Thought content normal.        Judgment: Judgment normal.     Lab Results  Component Value Date   WBC 7.1 12/16/2019   HGB 12.4 (L) 12/16/2019   HCT 36.5 (L) 12/16/2019   PLT 230.0 12/16/2019   GLUCOSE 138 (H) 05/03/2021   CHOL 139 07/27/2020   TRIG 206.0 (H) 07/27/2020   HDL 28.90 (L) 07/27/2020   LDLDIRECT 83.0 07/27/2020   LDLCALC 74 11/29/2013   ALT 11 05/03/2021   AST 13 05/03/2021   NA 139 05/03/2021   K 4.6 05/03/2021   CL 104 05/03/2021   CREATININE 1.56 (H) 05/03/2021   BUN 25 (H) 05/03/2021   CO2 28 05/03/2021   TSH 7.02  (H) 05/03/2021   PSA 1.38 05/03/2021   INR 1.15 01/21/2013   HGBA1C 7.0 (A) 08/13/2021   MICROALBUR <0.7 09/09/2016    VAS [place] LOWER EXTREMITY VENOUS (DVT)  Result Date: 12/17/2019  Lower Venous DVTStudy Indications: Right calf pain/ tightness.  Comparison Study: none Performing Technologist: June Leap RDMS, RVT  Examination Guidelines: A complete evaluation includes B-mode  imaging, spectral Doppler, color Doppler, and power Doppler as needed of all accessible portions of each vessel. Bilateral testing is considered an integral part of a complete examination. Limited examinations for reoccurring indications may be performed as noted. The reflux portion of the exam is performed with the patient in reverse Trendelenburg.  +-----+---------------+---------+-----------+----------+--------------+ RIGHTCompressibilityPhasicitySpontaneityPropertiesThrombus Aging +-----+---------------+---------+-----------+----------+--------------+ CFV  Full           Yes      Yes                                 +-----+---------------+---------+-----------+----------+--------------+   +---------+---------------+---------+-----------+----------+--------------+ LEFT     CompressibilityPhasicitySpontaneityPropertiesThrombus Aging +---------+---------------+---------+-----------+----------+--------------+ CFV      Full           Yes      Yes                                 +---------+---------------+---------+-----------+----------+--------------+ SFJ      Full                                                        +---------+---------------+---------+-----------+----------+--------------+ FV Prox  Full                                                        +---------+---------------+---------+-----------+----------+--------------+ FV Mid   Partial        Yes      Yes                  Acute          +---------+---------------+---------+-----------+----------+--------------+ FV  DistalNone           No       No                   Acute          +---------+---------------+---------+-----------+----------+--------------+ PFV      Full                                                        +---------+---------------+---------+-----------+----------+--------------+ POP      None           No       No                   Acute          +---------+---------------+---------+-----------+----------+--------------+ PTV      None                                         Acute          +---------+---------------+---------+-----------+----------+--------------+ PERO     None  Acute          +---------+---------------+---------+-----------+----------+--------------+ Gastroc  None                                         Acute          +---------+---------------+---------+-----------+----------+--------------+ GSV      Full                                                        +---------+---------------+---------+-----------+----------+--------------+    Findings reported to [facility] @ Dr. Ronnald Ramp' office at 3:18 pm.  Summary: RIGHT: - No evidence of common femoral vein obstruction.  LEFT: - Findings consistent with acute deep vein thrombosis involving the left femoral vein, left popliteal vein, left posterior tibial veins, left peroneal veins, and left gastrocnemius veins.  *See table(s) above for measurements and observations. Electronically signed by Monica Martinez MD on 12/17/2019 at 4:40:56 PM.    Final     Assessment & Plan:   Problem List Items Addressed This Visit     Coronary atherosclerosis    Cont on Losartan, NTG prn, Lovastatin, ASA      DM type 2 causing vascular disease ([place])    F/u w/Dr Shamleffer Chronic Metformin, Glipizide Rybelsus was too $$$$ Cont on Losartan      Essential hypertension    Chronic Cont on Irbesartan HCT      Type 2 diabetes mellitus with stage 3b chronic kidney  disease, without long-term current use of insulin ([place])    F/u w/Dr Shamleffer          No orders of the defined types were placed in this encounter.     Follow-up: Return in about 6 months (around 03/03/2022).  Walker Kehr, MD

## 2021-09-01 NOTE — Assessment & Plan Note (Addendum)
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

## 2021-09-01 NOTE — Assessment & Plan Note (Signed)
Cont on Losartan, NTG prn, Lovastatin, ASA

## 2021-09-01 NOTE — Assessment & Plan Note (Signed)
F/u w/Dr Shamleffer 

## 2021-10-26 ENCOUNTER — Telehealth: Payer: Self-pay | Admitting: Internal Medicine

## 2021-10-26 ENCOUNTER — Telehealth: Payer: Self-pay

## 2021-10-26 NOTE — Telephone Encounter (Signed)
Called pt spoke w/ wife she said she left a msg w/ his new endocrinologist concerning rx for new meter, but did need script for Viagra. Can send to [facility]..Chryl Heck

## 2021-10-26 NOTE — Telephone Encounter (Signed)
Patient will have his wife send the information needed to send script for meter to [place]. Just need to which one he is using

## 2021-10-26 NOTE — Telephone Encounter (Signed)
Pt wife Marjorie Smolder called and would like to know if Dr. Alain Marion could send in a request for a new glucose kit for [facility]. Marjorie Smolder stated her and the pharmacy have tried reaching out to Dr. Cordelia Pen office and have not been able to get a response.    Marjorie Smolder stated that [facility] and Dr. Alain Marion have discussed an rx for Viagra at previous appointments but never followed through with it. Loic would like to know if its still possible for him to get viagra.   Please advise  CB: [phone]

## 2021-10-27 MED ORDER — ACCU-CHEK AVIVA VI SOLN: 2 refills | Status: DC

## 2021-10-27 NOTE — Telephone Encounter (Signed)
He want rx for Viagra.Marland Kitchenlmb

## 2021-10-27 NOTE — Telephone Encounter (Signed)
Okay to send.  Is it Accu-Chek Aviva?  Does he need supplies?  Does he need Viagra?  Thanks

## 2021-10-28 ENCOUNTER — Other Ambulatory Visit: Payer: Self-pay

## 2021-10-28 DIAGNOSIS — E1142 Type 2 diabetes mellitus with diabetic polyneuropathy: Secondary | ICD-10-CM

## 2021-10-28 MED ORDER — ACCU-CHEK GUIDE VI STRP: ORAL_STRIP | 12 refills | Status: DC

## 2021-10-28 MED ORDER — ACCU-CHEK GUIDE ME W/DEVICE KIT: PACK | 0 refills | Status: DC

## 2021-10-28 MED ORDER — BD SWAB SINGLE USE REGULAR PADS: MEDICATED_PAD | 3 refills | Status: DC

## 2021-10-28 MED ORDER — ACCU-CHEK SOFTCLIX LANCETS MISC: 12 refills | Status: DC

## 2021-10-28 NOTE — Telephone Encounter (Signed)
Patient wife returned the call stating that [facility] is requesting that we send  - Accu-Chek Guide Me meter - Soft click lancets -Accu-chek test strips - BD single use swab  Orders have been sent to [facility].

## 2021-11-10 NOTE — Telephone Encounter (Signed)
Patient's wife has called back regarding request for Viagra - she would like call back at [phone]

## 2021-11-10 NOTE — Telephone Encounter (Signed)
Viagra was never sent in.. what dosage?..Johnny [name]

## 2021-11-13 MED ORDER — SILDENAFIL CITRATE 100 MG PO TABS: 50.0000 mg | ORAL_TABLET | Freq: Every day | ORAL | 11 refills | Status: DC | PRN

## 2021-11-13 NOTE — Telephone Encounter (Signed)
Okay Viagra 100 mg daily as needed.  Do not take with nitroglycerin.  Thanks

## 2021-11-16 NOTE — Telephone Encounter (Signed)
Notified pt wife rx sent to [facility].Marland KitchenJohny Chess

## 2021-11-26 ENCOUNTER — Telehealth: Payer: Self-pay | Admitting: Internal Medicine

## 2021-11-26 MED ORDER — ALBUTEROL SULFATE HFA 108 (90 BASE) MCG/ACT IN AERS: INHALATION_SPRAY | RESPIRATORY_TRACT | 5 refills | Status: AC

## 2021-11-26 NOTE — Telephone Encounter (Signed)
Notified wife rx sent to [facility].Marland KitchenJohny Chess

## 2021-11-26 NOTE — Telephone Encounter (Signed)
Patient would like his albuterol refilled - please send to [facility] on [address] - Please call patients wife and let her know when this is done.

## 2021-12-01 ENCOUNTER — Ambulatory Visit (INDEPENDENT_AMBULATORY_CARE_PROVIDER_SITE_OTHER): Payer: Medicare HMO | Admitting: Internal Medicine

## 2021-12-01 ENCOUNTER — Encounter: Payer: Self-pay | Admitting: Internal Medicine

## 2021-12-01 VITALS — BP 148/70 | HR 60 | Temp 98.4°F | Ht 69.0 in | Wt 232.0 lb

## 2021-12-01 DIAGNOSIS — I10 Essential (primary) hypertension: Secondary | ICD-10-CM | POA: Diagnosis not present

## 2021-12-01 DIAGNOSIS — M10079 Idiopathic gout, unspecified ankle and foot: Secondary | ICD-10-CM

## 2021-12-01 DIAGNOSIS — E1122 Type 2 diabetes mellitus with diabetic chronic kidney disease: Secondary | ICD-10-CM

## 2021-12-01 DIAGNOSIS — I2510 Atherosclerotic heart disease of native coronary artery without angina pectoris: Secondary | ICD-10-CM | POA: Diagnosis not present

## 2021-12-01 DIAGNOSIS — Z23 Encounter for immunization: Secondary | ICD-10-CM | POA: Diagnosis not present

## 2021-12-01 DIAGNOSIS — N320 Bladder-neck obstruction: Secondary | ICD-10-CM | POA: Diagnosis not present

## 2021-12-01 DIAGNOSIS — E1159 Type 2 diabetes mellitus with other circulatory complications: Secondary | ICD-10-CM

## 2021-12-01 DIAGNOSIS — N1832 Chronic kidney disease, stage 3b: Secondary | ICD-10-CM | POA: Diagnosis not present

## 2021-12-01 LAB — COMPREHENSIVE METABOLIC PANEL
ALT: 13 U/L (ref 0–53)
AST: 12 U/L (ref 0–37)
Albumin: 4.1 g/dL (ref 3.5–5.2)
Alkaline Phosphatase: 87 U/L (ref 39–117)
BUN: 24 mg/dL — ABNORMAL HIGH (ref 6–23)
CO2: 29 mEq/L (ref 19–32)
Calcium: 9.2 mg/dL (ref 8.4–10.5)
Chloride: 106 mEq/L (ref 96–112)
Creatinine, Ser: 1.61 mg/dL — ABNORMAL HIGH (ref 0.40–1.50)
GFR: 41.11 mL/min — ABNORMAL LOW (ref >60.00)
Glucose, Bld: 102 mg/dL — ABNORMAL HIGH (ref 70–99)
Potassium: 4.4 mEq/L (ref 3.5–5.1)
Sodium: 140 mEq/L (ref 135–145)
Total Bilirubin: 0.5 mg/dL (ref 0.2–1.2)
Total Protein: 7 g/dL (ref 6.0–8.3)

## 2021-12-01 LAB — URIC ACID: Uric Acid, Serum: 7.8 mg/dL (ref 4.0–7.8)

## 2021-12-01 LAB — PSA: PSA: 1.51 ng/mL (ref 0.10–4.00)

## 2021-12-01 LAB — HEMOGLOBIN A1C: Hgb A1c MFr Bld: 7.9 % — ABNORMAL HIGH (ref 4.6–6.5)

## 2021-12-01 NOTE — Progress Notes (Signed)
Subjective:  Patient ID: Johnny [name], male    DOB: 11-Jul-1945  Age: 76 y.o. MRN: [id number]  CC: Follow-up (3 month f/u- Flu shot)   HPI Johnny [name] presents for DM, HTN, CAD, gout  Outpatient Medications Prior to Visit  Medication Sig Dispense Refill   Accu-Chek Softclix Lancets lancets Use as instructed to check blood sugars 1 times a day, fasting 100 each 12   albuterol (VENTOLIN HFA) 108 (90 Base) MCG/ACT inhaler INHALE 2 PUFFS INTO THE LUNGS EVERY 4 HOURS AS NEEDED FOR WHEEZING OR SHORTNESS OF BREATH 18 g 5   Alcohol Swabs (B-D SINGLE USE SWABS REGULAR) PADS Dx: 250.00 100 each 3   allopurinol (ZYLOPRIM) 100 MG tablet TAKE 1 TABLET EVERY DAY 90 tablet 3   aspirin 81 MG EC tablet Take 2 tablets (162 mg total) by mouth daily. Swallow whole. 100 tablet 3   Blood Glucose Calibration (ACCU-CHEK AVIVA) SOLN Use as directed as needed. 1 each 2   Blood Glucose Monitoring Suppl (ACCU-CHEK GUIDE ME) w/Device KIT Use as instructed to check blood sugars 1 times a day, fasting 1 kit 0   cholecalciferol (VITAMIN D) 1000 UNITS tablet Take 1,000 Units by mouth every morning.     colchicine 0.6 MG tablet Take 1 tablet (0.6 mg total) by mouth daily. 90 tablet 3   finasteride (PROSCAR) 5 MG tablet TAKE 1 TABLET (5 MG TOTAL) BY MOUTH EVERY MORNING. 90 tablet 3   fluticasone furoate-vilanterol (BREO ELLIPTA) 100-25 MCG/INH AEPB Inhale 1 puff into the lungs daily. 1 each 5   glipiZIDE (GLUCOTROL) 5 MG tablet Take 1 tablet (5 mg total) by mouth daily before breakfast. 90 tablet 3   glucose blood (ACCU-CHEK GUIDE) test strip Use as instructed, check blood sugars 1 times a day, fasting 100 each 12   irbesartan-hydrochlorothiazide (AVALIDE) 300-12.5 MG tablet TAKE 1 TABLET EVERY DAY 90 tablet 3   Lancet Devices (ACCU-CHEK SOFTCLIX) lancets Use twice daily as instructed. Dx: 250.00 1 each 3   Lancets (ONETOUCH DELICA PLUS [id number]) MISC USE TWICE DAILY AS DIRECTED 200 each 3   lovastatin (MEVACOR) 40  MG tablet TAKE 1 TABLET EVERY MORNING 90 tablet 3   metFORMIN (GLUCOPHAGE) 500 MG tablet Take 1 tablet (500 mg total) by mouth daily with breakfast. 90 tablet 3   Multiple Vitamins-Minerals (MULTI-VITAMIN GUMMIES PO) Take 1 tablet by mouth daily.     nitroGLYCERIN (NITROSTAT) 0.4 MG SL tablet Place 1 tablet (0.4 mg total) under the tongue every 5 (five) minutes as needed for chest pain. 25 tablet 3   ONETOUCH ULTRA test strip USE TWICE DAILY AS  INSTRUCTED 200 strip 3   pantoprazole (PROTONIX) 40 MG tablet TAKE 1 TABLET EVERY MORNING 90 tablet 3   sildenafil (VIAGRA) 100 MG tablet Take 0.5-1 tablets (50-100 mg total) by mouth daily as needed for erectile dysfunction. 12 tablet 11   tamsulosin (FLOMAX) 0.4 MG CAPS capsule TAKE 1 CAPSULE (0.4 MG TOTAL) BY MOUTH DAILY. 90 capsule 3   No facility-administered medications prior to visit.    ROS: Review of Systems  Constitutional:  Negative for appetite change, fatigue and unexpected weight change.  HENT:  Negative for congestion, nosebleeds, sneezing, sore throat and trouble swallowing.   Eyes:  Negative for itching and visual disturbance.  Respiratory:  Negative for cough.   Cardiovascular:  Negative for chest pain, palpitations and leg swelling.  Gastrointestinal:  Negative for abdominal distention, blood in stool, diarrhea and nausea.  Genitourinary:  Negative  for frequency and hematuria.  Musculoskeletal:  Positive for arthralgias. Negative for back pain, gait problem, joint swelling and neck pain.  Skin:  Negative for rash.  Neurological:  Negative for dizziness, tremors, speech difficulty and weakness.  Psychiatric/Behavioral:  Negative for agitation, dysphoric mood and sleep disturbance. The patient is not nervous/anxious.     Objective:  BP (!) 148/70 (BP Location: Left Arm)   Pulse 60   Temp 98.4 F (36.9 C) (Oral)   Ht $R'5\' 9"'UQ$  (1.753 m)   Wt 232 lb (105.2 kg)   SpO2 98%   BMI 34.26 kg/m   BP Readings from Last 3 Encounters:   12/01/21 (!) 148/70  09/01/21 (!) 158/82  08/13/21 120/68    Wt Readings from Last 3 Encounters:  12/01/21 232 lb (105.2 kg)  09/01/21 227 lb (103 kg)  08/13/21 225 lb 6.4 oz (102.2 kg)    Physical Exam Constitutional:      General: He is not in acute distress.    Appearance: He is well-developed. He is obese.     Comments: NAD  Eyes:     Conjunctiva/sclera: Conjunctivae normal.     Pupils: Pupils are equal, round, and reactive to light.  Neck:     Thyroid: No thyromegaly.     Vascular: No JVD.  Cardiovascular:     Rate and Rhythm: Normal rate and regular rhythm.     Heart sounds: Normal heart sounds. No murmur heard.    No friction rub. No gallop.  Pulmonary:     Effort: Pulmonary effort is normal. No respiratory distress.     Breath sounds: Normal breath sounds. No wheezing or rales.  Chest:     Chest Abrahamsen: No tenderness.  Abdominal:     General: Bowel sounds are normal. There is no distension.     Palpations: Abdomen is soft. There is no mass.     Tenderness: There is no abdominal tenderness. There is no guarding or rebound.  Musculoskeletal:        General: Tenderness present. Normal range of motion.     Cervical back: Normal range of motion.  Lymphadenopathy:     Cervical: No cervical adenopathy.  Skin:    General: Skin is warm and dry.     Findings: No rash.  Neurological:     Mental Status: He is alert and oriented to person, place, and time.     Cranial Nerves: No cranial nerve deficit.     Motor: No abnormal muscle tone.     Coordination: Coordination normal.     Gait: Gait normal.     Deep Tendon Reflexes: Reflexes are normal and symmetric.  Psychiatric:        Behavior: Behavior normal.        Thought Content: Thought content normal.        Judgment: Judgment normal.     Lab Results  Component Value Date   WBC 7.1 12/16/2019   HGB 12.4 (L) 12/16/2019   HCT 36.5 (L) 12/16/2019   PLT 230.0 12/16/2019   GLUCOSE 102 (H) 12/01/2021   CHOL 139  07/27/2020   TRIG 206.0 (H) 07/27/2020   HDL 28.90 (L) 07/27/2020   LDLDIRECT 83.0 07/27/2020   LDLCALC 74 11/29/2013   ALT 13 12/01/2021   AST 12 12/01/2021   NA 140 12/01/2021   K 4.4 12/01/2021   CL 106 12/01/2021   CREATININE 1.61 (H) 12/01/2021   BUN 24 (H) 12/01/2021   CO2 29 12/01/2021   TSH 7.02 (  H) 05/03/2021   PSA 1.51 12/01/2021   INR 1.15 01/21/2013   HGBA1C 7.9 (H) 12/01/2021   MICROALBUR <0.7 09/09/2016    VAS [place] LOWER EXTREMITY VENOUS (DVT)  Result Date: 12/17/2019  Lower Venous DVTStudy Indications: Right calf pain/ tightness.  Comparison Study: none Performing Technologist: June Leap RDMS, RVT  Examination Guidelines: A complete evaluation includes B-mode imaging, spectral Doppler, color Doppler, and power Doppler as needed of all accessible portions of each vessel. Bilateral testing is considered an integral part of a complete examination. Limited examinations for reoccurring indications may be performed as noted. The reflux portion of the exam is performed with the patient in reverse Trendelenburg.  +-----+---------------+---------+-----------+----------+--------------+ RIGHTCompressibilityPhasicitySpontaneityPropertiesThrombus Aging +-----+---------------+---------+-----------+----------+--------------+ CFV  Full           Yes      Yes                                 +-----+---------------+---------+-----------+----------+--------------+   +---------+---------------+---------+-----------+----------+--------------+ LEFT     CompressibilityPhasicitySpontaneityPropertiesThrombus Aging +---------+---------------+---------+-----------+----------+--------------+ CFV      Full           Yes      Yes                                 +---------+---------------+---------+-----------+----------+--------------+ SFJ      Full                                                         +---------+---------------+---------+-----------+----------+--------------+ FV Prox  Full                                                        +---------+---------------+---------+-----------+----------+--------------+ FV Mid   Partial        Yes      Yes                  Acute          +---------+---------------+---------+-----------+----------+--------------+ FV DistalNone           No       No                   Acute          +---------+---------------+---------+-----------+----------+--------------+ PFV      Full                                                        +---------+---------------+---------+-----------+----------+--------------+ POP      None           No       No                   Acute          +---------+---------------+---------+-----------+----------+--------------+ PTV      None  Acute          +---------+---------------+---------+-----------+----------+--------------+ PERO     None                                         Acute          +---------+---------------+---------+-----------+----------+--------------+ Gastroc  None                                         Acute          +---------+---------------+---------+-----------+----------+--------------+ GSV      Full                                                        +---------+---------------+---------+-----------+----------+--------------+    Findings reported to [facility] @ Dr. Ronnald Ramp' office at 3:18 pm.  Summary: RIGHT: - No evidence of common femoral vein obstruction.  LEFT: - Findings consistent with acute deep vein thrombosis involving the left femoral vein, left popliteal vein, left posterior tibial veins, left peroneal veins, and left gastrocnemius veins.  *See table(s) above for measurements and observations. Electronically signed by Monica Martinez MD on 12/17/2019 at 4:40:56 PM.    Final     Assessment & Plan:   Problem List  Items Addressed This Visit     BLADDER OUTLET OBSTRUCTION   Coronary atherosclerosis    Chronic Cont on Losartan, Lovastatin, ASA      DM type 2 causing vascular disease ([place]) - Primary    Chronic Follow-up with Dr Kelton Pillar Continue on metformin, Glipizide Cont on Losartan      Essential hypertension    Cont on Irbesartan HCT Teaching exercise class >60 people x 45 min      Gout attack    Recurrent  On allopurinol; Take colchicine as needed Take Diclofenac daily prn      Type 2 diabetes mellitus with stage 3b chronic kidney disease, without long-term current use of insulin (HCC)    Pt had to d/c Rybelsus due to cost - Assistance app given      Other Visit Diagnoses     Needs flu shot       Relevant Orders   Flu Vaccine QUAD High Dose(Fluad) (Completed)         No orders of the defined types were placed in this encounter.     Follow-up: No follow-ups on file.  Walker Kehr, MD

## 2021-12-01 NOTE — Assessment & Plan Note (Addendum)
Chronic Follow-up with Dr Kelton Pillar Continue on metformin, Glipizide Cont on Losartan

## 2021-12-01 NOTE — Assessment & Plan Note (Signed)
Pt had to d/c Rybelsus due to cost - Assistance app given

## 2021-12-01 NOTE — Assessment & Plan Note (Signed)
Cont on Irbesartan HCT Teaching exercise class >60 people x 45 min

## 2021-12-05 NOTE — Assessment & Plan Note (Signed)
Recurrent  On allopurinol; Take colchicine as needed Take Diclofenac daily prn

## 2021-12-05 NOTE — Assessment & Plan Note (Signed)
Chronic Cont on Losartan, Lovastatin, ASA

## 2021-12-06 ENCOUNTER — Other Ambulatory Visit: Payer: Self-pay | Admitting: Internal Medicine

## 2021-12-06 MED ORDER — GLIPIZIDE 5 MG PO TABS: 5.0000 mg | ORAL_TABLET | Freq: Two times a day (BID) | ORAL | 3 refills | Status: DC

## 2021-12-13 ENCOUNTER — Encounter: Payer: Self-pay | Admitting: Internal Medicine

## 2021-12-13 ENCOUNTER — Ambulatory Visit: Payer: Medicare HMO | Admitting: Internal Medicine

## 2021-12-13 VITALS — BP 130/82 | HR 65 | Ht 69.0 in | Wt 229.0 lb

## 2021-12-13 DIAGNOSIS — E119 Type 2 diabetes mellitus without complications: Secondary | ICD-10-CM | POA: Insufficient documentation

## 2021-12-13 DIAGNOSIS — N1832 Chronic kidney disease, stage 3b: Secondary | ICD-10-CM

## 2021-12-13 DIAGNOSIS — E1122 Type 2 diabetes mellitus with diabetic chronic kidney disease: Secondary | ICD-10-CM | POA: Diagnosis not present

## 2021-12-13 DIAGNOSIS — E1142 Type 2 diabetes mellitus with diabetic polyneuropathy: Secondary | ICD-10-CM

## 2021-12-13 DIAGNOSIS — E1159 Type 2 diabetes mellitus with other circulatory complications: Secondary | ICD-10-CM

## 2021-12-13 LAB — POCT GLUCOSE (DEVICE FOR HOME USE): POC Glucose: 133 mg/dl — AB (ref 70–99)

## 2021-12-13 MED ORDER — EMPAGLIFLOZIN 10 MG PO TABS: 10.0000 mg | ORAL_TABLET | Freq: Every day | ORAL | 3 refills | Status: DC

## 2021-12-13 NOTE — Patient Instructions (Addendum)
-   Continue metformin 500 mg 1 tablet daily  - Continue Glipizide 5 mg, 1 tablet Before Breakfast and 1 tablet before Supper - Will work on getting Jardiance 10 mg daily    HOW TO TREAT LOW BLOOD SUGARS (Blood sugar LESS THAN 70 MG/DL) Please follow the RULE OF 15 for the treatment of hypoglycemia treatment (when your (blood sugars are less than 70 mg/dL)   STEP 1: Take 15 grams of carbohydrates when your blood sugar is low, which includes:  3-4 GLUCOSE TABS  OR 3-4 OZ OF JUICE OR REGULAR SODA OR ONE TUBE OF GLUCOSE GEL    STEP 2: RECHECK blood sugar in 15 MINUTES STEP 3: If your blood sugar is still low at the 15 minute recheck --> then, go back to STEP 1 and treat AGAIN with another 15 grams of carbohydrates.

## 2021-12-13 NOTE — Progress Notes (Signed)
Name: Johnny [name]  Age/ Sex: 76 y.o., male   MRN/ DOB: [id number], [birth year]     PCP: Cassandria Anger, MD   Reason for Endocrinology Evaluation: Type 2 Diabetes Mellitus  Initial Endocrine Consultative Visit: 09/15/2016    PATIENT IDENTIFIER: Johnny [name] is a 76 y.o. male with a past medical history of T2DM, CAD, HTN and dyslipidemia . The patient has followed with Endocrinology clinic since 09/15/2016 for consultative assistance with management of his diabetes.  DIABETIC HISTORY:  Johnny [name] was diagnosed with DM 1999, has been on oral glycemic agents since his dx . His hemoglobin A1c has ranged from 6.9% in 2021, peaking at 9.3% in 2022.  Transitioned from Dr. Loanne Drilling 08/2021   On his initial visit with me in June 2023 ,he had an A1c of 7.0%, she was on metformin, repaglinide, bromocriptine, and Rybelsus.  Patient was not taking repaglinide  (unknown reason ), we stopped bromocriptine,  increased Rybelsus, but the patient contacted the office stating that it is cost prohibitive.  So we started glipizide in June 2023    SUBJECTIVE:   During the last visit (08/13/2021): A1c 7.0%  Today (12/13/2021): Johnny [name] is here for a follow up on diabetes management.  He is accompanied by his spouse today. He checks his blood sugars 2 times daily. The patient has not had hypoglycemic episodes since the last clinic visit.  He did not bring his meter today. Lowest Bg 79 mg/dL    Denies nausea, vomiting or diarrhea     HOME DIABETES REGIMEN:  Metformin 500 mg daily  Glipizide 5 mg twice daily      Statin: yes ACE-I/ARB: yes    METER DOWNLOAD SUMMARY: did not bring   DIABETIC COMPLICATIONS: Microvascular complications:  CKD III, neuropathy Denies: retinopathy Last Eye Exam: Completed 01/2021  Macrovascular complications:  CAD ( S/P CABG ) Denies:  CVA, PVD   HISTORY:  Past Medical History:  Past Medical History:  Diagnosis Date   Arthritis    At risk for  sleep apnea    STOP-BANG=  6   SENT TO PCP 05-21-2013   Benign positional vertigo    Benign prostatic hypertrophy    Blood transfusion without reported diagnosis 2000   CAD (coronary artery disease)    CARDIOLOGIST--  DR Cristopher Peru   CHF (congestive heart failure) (HCC)    Chronic ischemic heart disease, unspecified    ED (erectile dysfunction) of organic origin    GERD (gastroesophageal reflux disease)    History of CHF (congestive heart failure)    systolic   HTN (hypertension)    Hyperlipidemia    Tubular adenoma of colon 12/2009   Type 2 diabetes mellitus ([place])    Wears glasses    Past Surgical History:  Past Surgical History:  Procedure Laterality Date   CARDIAC CATHETERIZATION  03-03-2000  dr Carleene Overlie taylor   normal lvsf/  two-vessel cad significant complex stenosis at distal left main   CARDIOVASCULAR STRESS TEST  08-09-2010  DR GREGG TAYLOR   normal lexiscan nuclear study/  no ischemia/  normal lvf/  ef 54%   CATARACT EXTRACTION W/ INTRAOCULAR LENS IMPLANT Right 03/07/2009   COLONOSCOPY  10/09/2020   12/30/2009   CORONARY ARTERY BYPASS GRAFT  03-06-2000   DR GERHARDT   third vessel   PENILE PROSTHESIS IMPLANT  02/08/2010   COLOPLAST 3-PIECE INFLATABLE   PENILE PROSTHESIS IMPLANT N/A 05/27/2013   Procedure: CYSTO REMOVAL OF PENILE PROSTHESIS;  Surgeon: Thana Farr  Karsten Ro, MD;  Location: [facility];  Service: Urology;  Laterality: N/A;   POLYPECTOMY  12/30/2009   +TA   TOTAL HIP ARTHROPLASTY  01/24/2011   Procedure: TOTAL HIP ARTHROPLASTY;  Surgeon: Dione Plover [name];  Location: [facility];  Service: Orthopedics;  Laterality: Left;   TOTAL HIP ARTHROPLASTY Right 01/30/2013   Procedure: RIGHT TOTAL HIP ARTHROPLASTY;  Surgeon: Gearlean Alf, MD;  Location: [facility];  Service: Orthopedics;  Laterality: Right;   Social History:  reports that he quit smoking about 51 years ago. His smoking use included cigarettes. He has never used smokeless tobacco. He reports that  he does not drink alcohol and does not use drugs. Family History:  Family History  Problem Relation Age of Onset   Hypertension Mother    Diabetes Father    Cancer Sister 40       ovarian ca   Cancer - Other Brother        cancer all over   Coronary artery disease Other        1st degree male relative   Hypertension Other    Colon cancer Neg Hx    Stomach cancer Neg Hx    Esophageal cancer Neg Hx    Pancreatic cancer Neg Hx    Liver disease Neg Hx    Colon polyps Neg Hx    Rectal cancer Neg Hx      HOME MEDICATIONS: Allergies as of 12/13/2021       Reactions   Enalapril    SOB   Hydrocodone-acetaminophen Nausea Only   Can take codein        Medication List        Accurate as of December 13, 2021  2:14 PM. If you have any questions, ask your nurse or doctor.          Accu-Chek Aviva Soln Use as directed as needed.   Accu-Chek Guide Me w/Device Kit Use as instructed to check blood sugars 1 times a day, fasting   accu-chek softclix lancets Use twice daily as instructed. Dx: 250.00   albuterol 108 (90 Base) MCG/ACT inhaler Commonly known as: Ventolin HFA INHALE 2 PUFFS INTO THE LUNGS EVERY 4 HOURS AS NEEDED FOR WHEEZING OR SHORTNESS OF BREATH   allopurinol 100 MG tablet Commonly known as: ZYLOPRIM TAKE 1 TABLET EVERY DAY   aspirin EC 81 MG tablet Take 2 tablets (162 mg total) by mouth daily. Swallow whole.   B-D SINGLE USE SWABS REGULAR Pads Dx: 250.00   cholecalciferol 1000 units tablet Commonly known as: VITAMIN D Take 1,000 Units by mouth every morning.   colchicine 0.6 MG tablet Take 1 tablet (0.6 mg total) by mouth daily.   finasteride 5 MG tablet Commonly known as: PROSCAR TAKE 1 TABLET (5 MG TOTAL) BY MOUTH EVERY MORNING.   fluticasone furoate-vilanterol 100-25 MCG/INH Aepb Commonly known as: Breo Ellipta Inhale 1 puff into the lungs daily.   glipiZIDE 5 MG tablet Commonly known as: GLUCOTROL Take 1 tablet (5 mg total) by mouth 2  (two) times daily before a meal.   irbesartan-hydrochlorothiazide 300-12.5 MG tablet Commonly known as: AVALIDE TAKE 1 TABLET EVERY DAY   lovastatin 40 MG tablet Commonly known as: MEVACOR TAKE 1 TABLET EVERY MORNING   metFORMIN 500 MG tablet Commonly known as: GLUCOPHAGE Take 1 tablet (500 mg total) by mouth daily with breakfast.   MULTI-VITAMIN GUMMIES PO Take 1 tablet by mouth daily.   nitroGLYCERIN 0.4 MG SL tablet Commonly known as:  NITROSTAT Place 1 tablet (0.4 mg total) under the tongue every 5 (five) minutes as needed for chest pain.   OneTouch Delica Plus [id number] Misc USE TWICE DAILY AS DIRECTED   Accu-Chek Softclix Lancets lancets Use as instructed to check blood sugars 1 times a day, fasting   OneTouch Ultra test strip Generic drug: glucose blood USE TWICE DAILY AS  INSTRUCTED   Accu-Chek Guide test strip Generic drug: glucose blood Use as instructed, check blood sugars 1 times a day, fasting   pantoprazole 40 MG tablet Commonly known as: PROTONIX TAKE 1 TABLET EVERY MORNING   sildenafil 100 MG tablet Commonly known as: Viagra Take 0.5-1 tablets (50-100 mg total) by mouth daily as needed for erectile dysfunction.   tamsulosin 0.4 MG Caps capsule Commonly known as: FLOMAX TAKE 1 CAPSULE (0.4 MG TOTAL) BY MOUTH DAILY.         OBJECTIVE:   Vital Signs: BP 130/82 (BP Location: Left Arm, Patient Position: Sitting, Cuff Size: Small)   Pulse 65   Ht 5' 9" (1.753 m)   Wt 229 lb (103.9 kg)   SpO2 98%   BMI 33.82 kg/m   Wt Readings from Last 3 Encounters:  12/13/21 229 lb (103.9 kg)  12/01/21 232 lb (105.2 kg)  09/01/21 227 lb (103 kg)     Exam: General: Pt appears well and is in NAD  Lungs: Clear with good BS bilat with no rales, rhonchi, or wheezes  Heart: RRR   Extremities: Trace  pretibial edema on the left , no edema on the right   Neuro: MS is good with appropriate affect, pt is alert and Ox3    DM foot exam: 08/13/2021     The  skin of the feet is intact without sores or ulcerations. The pedal pulses are 2+ on right and 2+ on left. The sensation is intact to a screening 5.07, 10 gram monofilament bilaterally     DATA REVIEWED:  Lab Results  Component Value Date   HGBA1C 7.9 (H) 12/01/2021   HGBA1C 7.0 (A) 08/13/2021   HGBA1C 7.5 (A) 06/01/2021    Latest Reference Range & Units 12/01/21 15:39  Sodium 135 - 145 mEq/L 140  Potassium 3.5 - 5.1 mEq/L 4.4  Chloride 96 - 112 mEq/L 106  CO2 19 - 32 mEq/L 29  Glucose 70 - 99 mg/dL 102 (H)  BUN 6 - 23 mg/dL 24 (H)  Creatinine 0.40 - 1.50 mg/dL 1.61 (H)  Calcium 8.4 - 10.5 mg/dL 9.2  Alkaline Phosphatase 39 - 117 U/L 87  Albumin 3.5 - 5.2 g/dL 4.1  Uric Acid, Serum 4.0 - 7.8 mg/dL 7.8  AST 0 - 37 U/L 12  ALT 0 - 53 U/L 13  Total Protein 6.0 - 8.3 g/dL 7.0  Total Bilirubin 0.2 - 1.2 mg/dL 0.5  GFR >60.00 mL/min 41.11 (L)    Latest Reference Range & Units 12/01/21 15:39  Hemoglobin A1C 4.6 - 6.5 % 7.9 (H)       ASSESSMENT / PLAN / RECOMMENDATIONS:   1) Type 2 Diabetes Mellitus, Sub-Optimally controlled, With neuropathic, CKD iii and macrovascular  complications - Most recent A1c of 7.9 %. Goal A1c < 7.0 %.     - A1c trended up , most likely due to lack of Rybelsus  -Unable to increase metformin due to low GFR, wife is skeptical about this , reassurance provided , discussed benefits as well as mechanism with low GFR -SGLT2 inhibitors and GLP-1 agonist are cost prohibitive - He  was provided with pt assistance forms for jardiance today  - No changes to glipizide for now, will consider decreasing to 1 tab daily with jardiance if needed   MEDICATIONS: Continue metformin 500 mg 1 tablet daily Continue Glipizide  mg BID  We will consider starting Jardiance 10 mg daily  EDUCATION / INSTRUCTIONS: BG monitoring instructions: Patient is instructed to check his blood sugars 1 times a day, fasting. Call [facility] if: BG persistently < 70   I reviewed the Rule of 15 for the treatment of hypoglycemia in detail with the patient. Literature supplied.   2) Diabetic complications:  Eye: Does not have known diabetic retinopathy.  Neuro/ Feet: Does  have known diabetic peripheral neuropathy .  Renal: Patient does have known baseline CKD. He   is  on an ACEI/ARB at present.       F/U in 4 months   Signed electronically by: Mack Guise, MD  [facility] Endocrin[facility]  [facility] [address] [place], [place] [address] Phone: [phone] FAX: [phone]   CC: Cassandria Anger, MD [place] [address] Phone: [phone]  Fax: [phone]  Return to Endocrinology clinic as below: Future Appointments  Date Time Provider [place]  03/02/2022  3:00 PM Plotnikov, Evie Lacks, MD [facility] None

## 2022-01-04 ENCOUNTER — Other Ambulatory Visit: Payer: Self-pay | Admitting: Internal Medicine

## 2022-02-15 ENCOUNTER — Other Ambulatory Visit: Payer: Self-pay | Admitting: Internal Medicine

## 2022-03-02 ENCOUNTER — Ambulatory Visit: Payer: Medicare HMO | Admitting: Internal Medicine

## 2022-03-08 ENCOUNTER — Ambulatory Visit (INDEPENDENT_AMBULATORY_CARE_PROVIDER_SITE_OTHER): Payer: Medicare HMO | Admitting: Internal Medicine

## 2022-03-08 ENCOUNTER — Encounter: Payer: Self-pay | Admitting: Internal Medicine

## 2022-03-08 VITALS — BP 138/76 | HR 65 | Temp 98.5°F | Ht 69.0 in | Wt 231.0 lb

## 2022-03-08 DIAGNOSIS — E1122 Type 2 diabetes mellitus with diabetic chronic kidney disease: Secondary | ICD-10-CM

## 2022-03-08 DIAGNOSIS — I10 Essential (primary) hypertension: Secondary | ICD-10-CM | POA: Diagnosis not present

## 2022-03-08 DIAGNOSIS — M1611 Unilateral primary osteoarthritis, right hip: Secondary | ICD-10-CM | POA: Diagnosis not present

## 2022-03-08 DIAGNOSIS — I259 Chronic ischemic heart disease, unspecified: Secondary | ICD-10-CM | POA: Diagnosis not present

## 2022-03-08 DIAGNOSIS — N1832 Chronic kidney disease, stage 3b: Secondary | ICD-10-CM

## 2022-03-08 LAB — COMPREHENSIVE METABOLIC PANEL
ALT: 16 U/L (ref 0–53)
AST: 15 U/L (ref 0–37)
Albumin: 4 g/dL (ref 3.5–5.2)
Alkaline Phosphatase: 91 U/L (ref 39–117)
BUN: 21 mg/dL (ref 6–23)
CO2: 26 mEq/L (ref 19–32)
Calcium: 9.4 mg/dL (ref 8.4–10.5)
Chloride: 105 mEq/L (ref 96–112)
Creatinine, Ser: 1.51 mg/dL — ABNORMAL HIGH (ref 0.40–1.50)
GFR: 44.31 mL/min — ABNORMAL LOW (ref >60.00)
Glucose, Bld: 203 mg/dL — ABNORMAL HIGH (ref 70–99)
Potassium: 4.7 mEq/L (ref 3.5–5.1)
Sodium: 140 mEq/L (ref 135–145)
Total Bilirubin: 0.4 mg/dL (ref 0.2–1.2)
Total Protein: 7 g/dL (ref 6.0–8.3)

## 2022-03-08 LAB — HEMOGLOBIN A1C: Hgb A1c MFr Bld: 9 % — ABNORMAL HIGH (ref 4.6–6.5)

## 2022-03-08 LAB — URIC ACID: Uric Acid, Serum: 6.8 mg/dL (ref 4.0–7.8)

## 2022-03-08 LAB — TSH: TSH: 6.61 u[IU]/mL — ABNORMAL HIGH (ref 0.35–5.50)

## 2022-03-08 MED ORDER — EMPAGLIFLOZIN 10 MG PO TABS: 10.0000 mg | ORAL_TABLET | Freq: Every day | ORAL | 11 refills | Status: DC

## 2022-03-08 NOTE — Assessment & Plan Note (Signed)
Re-start Vania Rea (never took it) 03/2022 Check labs

## 2022-03-08 NOTE — Assessment & Plan Note (Signed)
ASA, Lovastatin Re-start Jardiance (never took it) 03/2022

## 2022-03-08 NOTE — Progress Notes (Signed)
Subjective:  Patient ID: Johnny [name], male    DOB: May 02, 1945  Age: 77 y.o. MRN: [id number]  CC: Follow-up   HPI Johnny [name] presents for DM, HTN, CAD  Outpatient Medications Prior to Visit  Medication Sig Dispense Refill   Accu-Chek Softclix Lancets lancets Use as instructed to check blood sugars 1 times a day, fasting 100 each 12   albuterol (VENTOLIN HFA) 108 (90 Base) MCG/ACT inhaler INHALE 2 PUFFS INTO THE LUNGS EVERY 4 HOURS AS NEEDED FOR WHEEZING OR SHORTNESS OF BREATH 18 g 5   Alcohol Swabs (B-D SINGLE USE SWABS REGULAR) PADS Dx: 250.00 100 each 3   allopurinol (ZYLOPRIM) 100 MG tablet TAKE 1 TABLET EVERY DAY 90 tablet 3   aspirin 81 MG EC tablet Take 2 tablets (162 mg total) by mouth daily. Swallow whole. 100 tablet 3   Blood Glucose Calibration (ACCU-CHEK AVIVA) SOLN Use as directed as needed. 1 each 2   Blood Glucose Monitoring Suppl (ACCU-CHEK GUIDE ME) w/Device KIT Use as instructed to check blood sugars 1 times a day, fasting 1 kit 0   cholecalciferol (VITAMIN D) 1000 UNITS tablet Take 1,000 Units by mouth every morning.     colchicine 0.6 MG tablet Take 1 tablet (0.6 mg total) by mouth daily. 90 tablet 3   finasteride (PROSCAR) 5 MG tablet TAKE 1 TABLET (5 MG TOTAL) EVERY MORNING. 90 tablet 3   fluticasone furoate-vilanterol (BREO ELLIPTA) 100-25 MCG/INH AEPB Inhale 1 puff into the lungs daily. 1 each 5   glipiZIDE (GLUCOTROL) 5 MG tablet Take 1 tablet (5 mg total) by mouth 2 (two) times daily before a meal. 180 tablet 3   glucose blood (ACCU-CHEK GUIDE) test strip Use as instructed, check blood sugars 1 times a day, fasting 100 each 12   irbesartan-hydrochlorothiazide (AVALIDE) 300-12.5 MG tablet TAKE 1 TABLET EVERY DAY 90 tablet 3   Lancet Devices (ACCU-CHEK SOFTCLIX) lancets Use twice daily as instructed. Dx: 250.00 1 each 3   Lancets (ONETOUCH DELICA PLUS [id number]) MISC USE TWICE DAILY AS DIRECTED 200 each 3   lovastatin (MEVACOR) 40 MG tablet TAKE 1 TABLET  EVERY MORNING 90 tablet 3   metFORMIN (GLUCOPHAGE) 500 MG tablet Take 1 tablet (500 mg total) by mouth daily with breakfast. 90 tablet 3   Multiple Vitamins-Minerals (MULTI-VITAMIN GUMMIES PO) Take 1 tablet by mouth daily.     nitroGLYCERIN (NITROSTAT) 0.4 MG SL tablet Place 1 tablet (0.4 mg total) under the tongue every 5 (five) minutes as needed for chest pain. 25 tablet 3   ONETOUCH ULTRA test strip USE TWICE DAILY AS  INSTRUCTED 200 strip 3   pantoprazole (PROTONIX) 40 MG tablet TAKE 1 TABLET EVERY MORNING 90 tablet 3   sildenafil (VIAGRA) 100 MG tablet Take 0.5-1 tablets (50-100 mg total) by mouth daily as needed for erectile dysfunction. 12 tablet 11   tamsulosin (FLOMAX) 0.4 MG CAPS capsule TAKE 1 CAPSULE (0.4 MG TOTAL) DAILY. 90 capsule 3   empagliflozin (JARDIANCE) 10 MG TABS tablet Take 1 tablet (10 mg total) by mouth daily before breakfast. (Patient not taking: Reported on 03/08/2022) 90 tablet 3   No facility-administered medications prior to visit.    ROS: Review of Systems  Constitutional:  Positive for fatigue. Negative for appetite change and unexpected weight change.  HENT:  Negative for congestion, nosebleeds, sneezing, sore throat and trouble swallowing.   Eyes:  Negative for itching and visual disturbance.  Respiratory:  Negative for cough.   Cardiovascular:  Negative  for chest pain, palpitations and leg swelling.  Gastrointestinal:  Negative for abdominal distention, blood in stool, diarrhea and nausea.  Genitourinary:  Negative for frequency and hematuria.  Musculoskeletal:  Positive for arthralgias. Negative for back pain, gait problem, joint swelling and neck pain.  Skin:  Negative for rash.  Neurological:  Negative for dizziness, tremors, speech difficulty and weakness.  Psychiatric/Behavioral:  Negative for agitation, dysphoric mood and sleep disturbance. The patient is not nervous/anxious.     Objective:  BP 138/76 (BP Location: Left Arm, Patient Position:  Sitting, Cuff Size: Normal)   Pulse 65   Temp 98.5 F (36.9 C) (Oral)   Ht _0  (1.753 m)   Wt 231 lb (104.8 kg)   SpO2 98%   BMI 34.11 kg/m   BP Readings from Last 3 Encounters:  03/08/22 138/76  12/13/21 130/82  12/01/21 (!) 148/70    Wt Readings from Last 3 Encounters:  03/08/22 231 lb (104.8 kg)  12/13/21 229 lb (103.9 kg)  12/01/21 232 lb (105.2 kg)    Physical Exam Constitutional:      General: He is not in acute distress.    Appearance: He is well-developed. He is obese.     Comments: NAD  Eyes:     Conjunctiva/sclera: Conjunctivae normal.     Pupils: Pupils are equal, round, and reactive to light.  Neck:     Thyroid: No thyromegaly.     Vascular: No JVD.  Cardiovascular:     Rate and Rhythm: Normal rate and regular rhythm.     Heart sounds: Normal heart sounds. No murmur heard.    No friction rub. No gallop.  Pulmonary:     Effort: Pulmonary effort is normal. No respiratory distress.     Breath sounds: Normal breath sounds. No wheezing or rales.  Chest:     Chest Muntean: No tenderness.  Abdominal:     General: Bowel sounds are normal. There is no distension.     Palpations: Abdomen is soft. There is no mass.     Tenderness: There is no abdominal tenderness. There is no guarding or rebound.  Musculoskeletal:        General: No tenderness. Normal range of motion.     Cervical back: Normal range of motion.  Lymphadenopathy:     Cervical: No cervical adenopathy.  Skin:    General: Skin is warm and dry.     Findings: No rash.  Neurological:     Mental Status: He is alert and oriented to person, place, and time.     Cranial Nerves: No cranial nerve deficit.     Motor: No abnormal muscle tone.     Coordination: Coordination normal.     Gait: Gait normal.     Deep Tendon Reflexes: Reflexes are normal and symmetric.  Psychiatric:        Behavior: Behavior normal.        Thought Content: Thought content normal.        Judgment: Judgment normal.     Lab  Results  Component Value Date   WBC 7.1 12/16/2019   HGB 12.4 (L) 12/16/2019   HCT 36.5 (L) 12/16/2019   PLT 230.0 12/16/2019   GLUCOSE 102 (H) 12/01/2021   CHOL 139 07/27/2020   TRIG 206.0 (H) 07/27/2020   HDL 28.90 (L) 07/27/2020   LDLDIRECT 83.0 07/27/2020   LDLCALC 74 11/29/2013   ALT 13 12/01/2021   AST 12 12/01/2021   NA 140 12/01/2021   K 4.4 12/01/2021  CL 106 12/01/2021   CREATININE 1.61 (H) 12/01/2021   BUN 24 (H) 12/01/2021   CO2 29 12/01/2021   TSH 7.02 (H) 05/03/2021   PSA 1.51 12/01/2021   INR 1.15 01/21/2013   HGBA1C 7.9 (H) 12/01/2021   MICROALBUR <0.7 09/09/2016    VAS [place] LOWER EXTREMITY VENOUS (DVT)  Result Date: 12/17/2019  Lower Venous DVTStudy Indications: Right calf pain/ tightness.  Comparison Study: none Performing Technologist: June Leap RDMS, RVT  Examination Guidelines: A complete evaluation includes B-mode imaging, spectral Doppler, color Doppler, and power Doppler as needed of all accessible portions of each vessel. Bilateral testing is considered an integral part of a complete examination. Limited examinations for reoccurring indications may be performed as noted. The reflux portion of the exam is performed with the patient in reverse Trendelenburg.  +-----+---------------+---------+-----------+----------+--------------+ RIGHTCompressibilityPhasicitySpontaneityPropertiesThrombus Aging +-----+---------------+---------+-----------+----------+--------------+ CFV  Full           Yes      Yes                                 +-----+---------------+---------+-----------+----------+--------------+   +---------+---------------+---------+-----------+----------+--------------+ LEFT     CompressibilityPhasicitySpontaneityPropertiesThrombus Aging +---------+---------------+---------+-----------+----------+--------------+ CFV      Full           Yes      Yes                                  +---------+---------------+---------+-----------+----------+--------------+ SFJ      Full                                                        +---------+---------------+---------+-----------+----------+--------------+ FV Prox  Full                                                        +---------+---------------+---------+-----------+----------+--------------+ FV Mid   Partial        Yes      Yes                  Acute          +---------+---------------+---------+-----------+----------+--------------+ FV DistalNone           No       No                   Acute          +---------+---------------+---------+-----------+----------+--------------+ PFV      Full                                                        +---------+---------------+---------+-----------+----------+--------------+ POP      None           No       No                   Acute          +---------+---------------+---------+-----------+----------+--------------+  PTV      None                                         Acute          +---------+---------------+---------+-----------+----------+--------------+ PERO     None                                         Acute          +---------+---------------+---------+-----------+----------+--------------+ Gastroc  None                                         Acute          +---------+---------------+---------+-----------+----------+--------------+ GSV      Full                                                        +---------+---------------+---------+-----------+----------+--------------+    Findings reported to [facility] @ Dr. Ronnald Ramp' office at 3:18 pm.  Summary: RIGHT: - No evidence of common femoral vein obstruction.  LEFT: - Findings consistent with acute deep vein thrombosis involving the left femoral vein, left popliteal vein, left posterior tibial veins, left peroneal veins, and left gastrocnemius veins.  *See table(s) above for  measurements and observations. Electronically signed by Monica Martinez MD on 12/17/2019 at 4:40:56 PM.    Final     Assessment & Plan:   Problem List Items Addressed This Visit     Type 2 diabetes mellitus with stage 3b chronic kidney disease, without long-term current use of insulin ([place]) - Primary    Re-start Jardiance (never took it) 03/2022 Check labs      Relevant Medications   empagliflozin (JARDIANCE) 10 MG TABS tablet   Other Relevant Orders   Comprehensive metabolic panel   Hemoglobin A1c   Uric acid   TSH   Osteoarthritis of right hip    Cont w/wt loss      ISCHEMIC HEART DISEASE    ASA, Lovastatin Re-start Jardiance (never took it) 03/2022      Relevant Orders   Comprehensive metabolic panel   Uric acid   Essential hypertension     Re-start Jardiance (never took it) 03/2022      Relevant Orders   Uric acid      Meds ordered this encounter  Medications   empagliflozin (JARDIANCE) 10 MG TABS tablet    Sig: Take 1 tablet (10 mg total) by mouth daily before breakfast.    Dispense:  30 tablet    Refill:  11      Follow-up: Return in about 3 months (around 06/07/2022) for a follow-up visit.  Walker Kehr, MD

## 2022-03-08 NOTE — Assessment & Plan Note (Signed)
Cont w/wt loss 

## 2022-03-08 NOTE — Assessment & Plan Note (Signed)
  Re-start Vania Rea (never took it) 03/2022

## 2022-03-09 ENCOUNTER — Other Ambulatory Visit (INDEPENDENT_AMBULATORY_CARE_PROVIDER_SITE_OTHER): Payer: Medicare HMO

## 2022-03-09 DIAGNOSIS — R7989 Other specified abnormal findings of blood chemistry: Secondary | ICD-10-CM

## 2022-03-09 LAB — T4, FREE: Free T4: 0.7 ng/dL (ref 0.60–1.60)

## 2022-03-30 DIAGNOSIS — H04123 Dry eye syndrome of bilateral lacrimal glands: Secondary | ICD-10-CM | POA: Diagnosis not present

## 2022-03-30 DIAGNOSIS — H26493 Other secondary cataract, bilateral: Secondary | ICD-10-CM | POA: Diagnosis not present

## 2022-03-30 DIAGNOSIS — E119 Type 2 diabetes mellitus without complications: Secondary | ICD-10-CM | POA: Diagnosis not present

## 2022-03-30 DIAGNOSIS — H35342 Macular cyst, hole, or pseudohole, left eye: Secondary | ICD-10-CM | POA: Diagnosis not present

## 2022-03-30 DIAGNOSIS — H35033 Hypertensive retinopathy, bilateral: Secondary | ICD-10-CM | POA: Diagnosis not present

## 2022-04-01 LAB — HM DIABETES EYE EXAM

## 2022-04-08 ENCOUNTER — Encounter: Payer: Self-pay | Admitting: Internal Medicine

## 2022-04-22 ENCOUNTER — Encounter: Payer: Self-pay | Admitting: Internal Medicine

## 2022-04-22 ENCOUNTER — Ambulatory Visit: Payer: Medicare HMO | Admitting: Internal Medicine

## 2022-04-22 VITALS — BP 122/70 | HR 60 | Ht 69.0 in | Wt 226.0 lb

## 2022-04-22 DIAGNOSIS — N1832 Chronic kidney disease, stage 3b: Secondary | ICD-10-CM | POA: Diagnosis not present

## 2022-04-22 DIAGNOSIS — E1159 Type 2 diabetes mellitus with other circulatory complications: Secondary | ICD-10-CM

## 2022-04-22 DIAGNOSIS — E1122 Type 2 diabetes mellitus with diabetic chronic kidney disease: Secondary | ICD-10-CM

## 2022-04-22 DIAGNOSIS — E1142 Type 2 diabetes mellitus with diabetic polyneuropathy: Secondary | ICD-10-CM

## 2022-04-22 MED ORDER — EMPAGLIFLOZIN 25 MG PO TABS: 25.0000 mg | ORAL_TABLET | Freq: Every day | ORAL | 3 refills | Status: DC

## 2022-04-22 NOTE — Progress Notes (Signed)
Name: Johnny [name]  Age/ Sex: 77 y.o., male   MRN/ DOB: [id number], 1945-04-08     PCP: Cassandria Anger, MD   Reason for Endocrinology Evaluation: Type 2 Diabetes Mellitus  Initial Endocrine Consultative Visit: 09/15/2016    PATIENT IDENTIFIER: Johnny [name] is a 77 y.o. male with a past medical history of T2DM, CAD, HTN and dyslipidemia . The patient has followed with Endocrinology clinic since 09/15/2016 for consultative assistance with management of his diabetes.  DIABETIC HISTORY:  Johnny [name] was diagnosed with DM 1999, has been on oral glycemic agents since his dx . His hemoglobin A1c has ranged from 6.9% in 2021, peaking at 9.3% in 2022.  Transitioned from Dr. Loanne Drilling 08/2021   On his initial visit with me in June 2023 ,he had an A1c of 7.0%, she was on metformin, repaglinide, bromocriptine, and Rybelsus.  Patient was not taking repaglinide  (unknown reason ), we stopped bromocriptine,  increased Rybelsus, but the patient contacted the office stating that it is cost prohibitive.  So we started glipizide in June 2023    SUBJECTIVE:   During the last visit (12/13/2021): A1c 7.0%  Today (04/22/2022): Johnny [name] is here for a follow up on diabetes management.  He is accompanied by his spouse today. He checks his blood sugars 1 times daily.   Denies nausea, vomiting or diarrhea  but has occasional constipation  Has chronic frequency which has increased   HOME DIABETES REGIMEN:  Metformin 500 mg daily  Glipizide 5 mg twice daily-has been taking once daily Jardiance 10 mg daily      Statin: yes ACE-I/ARB: yes    METER DOWNLOAD SUMMARY: unable to download  [id number] DIABETIC COMPLICATIONS: Microvascular complications:  CKD III, neuropathy Denies: retinopathy Last Eye Exam: Completed 01/2021  Macrovascular complications:  CAD ( S/P CABG ) Denies:  CVA, PVD   HISTORY:  Past Medical History:  Past Medical History:  Diagnosis Date   Arthritis    At risk for  sleep apnea    STOP-BANG=  6   SENT TO PCP 05-21-2013   Benign positional vertigo    Benign prostatic hypertrophy    Blood transfusion without reported diagnosis 2000   CAD (coronary artery disease)    CARDIOLOGIST--  DR Cristopher Peru   CHF (congestive heart failure) (HCC)    Chronic ischemic heart disease, unspecified    ED (erectile dysfunction) of organic origin    GERD (gastroesophageal reflux disease)    History of CHF (congestive heart failure)    systolic   HTN (hypertension)    Hyperlipidemia    Tubular adenoma of colon 12/2009   Type 2 diabetes mellitus ([place])    Wears glasses    Past Surgical History:  Past Surgical History:  Procedure Laterality Date   CARDIAC CATHETERIZATION  03-03-2000  dr Carleene Overlie taylor   normal lvsf/  two-vessel cad significant complex stenosis at distal left main   CARDIOVASCULAR STRESS TEST  08-09-2010  DR GREGG TAYLOR   normal lexiscan nuclear study/  no ischemia/  normal lvf/  ef 54%   CATARACT EXTRACTION W/ INTRAOCULAR LENS IMPLANT Right 03/07/2009   COLONOSCOPY  10/09/2020   12/30/2009   CORONARY ARTERY BYPASS GRAFT  03-06-2000   DR GERHARDT   third vessel   PENILE PROSTHESIS IMPLANT  02/08/2010   COLOPLAST 3-PIECE INFLATABLE   PENILE PROSTHESIS IMPLANT N/A 05/27/2013   Procedure: CYSTO REMOVAL OF PENILE PROSTHESIS;  Surgeon: Claybon Jabs, MD;  Location: [facility];  Service: Urology;  Laterality: N/A;   POLYPECTOMY  12/30/2009   +TA   TOTAL HIP ARTHROPLASTY  01/24/2011   Procedure: TOTAL HIP ARTHROPLASTY;  Surgeon: Dione Plover [name];  Location: [facility];  Service: Orthopedics;  Laterality: Left;   TOTAL HIP ARTHROPLASTY Right 01/30/2013   Procedure: RIGHT TOTAL HIP ARTHROPLASTY;  Surgeon: Gearlean Alf, MD;  Location: [facility];  Service: Orthopedics;  Laterality: Right;   Social History:  reports that he quit smoking about 51 years ago. His smoking use included cigarettes. He has never used smokeless tobacco. He reports that  he does not drink alcohol and does not use drugs. Family History:  Family History  Problem Relation Age of Onset   Hypertension Mother    Diabetes Father    Cancer Sister 39       ovarian ca   Cancer - Other Brother        cancer all over   Coronary artery disease Other        1st degree male relative   Hypertension Other    Colon cancer Neg Hx    Stomach cancer Neg Hx    Esophageal cancer Neg Hx    Pancreatic cancer Neg Hx    Liver disease Neg Hx    Colon polyps Neg Hx    Rectal cancer Neg Hx      HOME MEDICATIONS: Allergies as of 04/22/2022       Reactions   Enalapril    SOB   Hydrocodone-acetaminophen Nausea Only   Can take codein        Medication List        Accurate as of April 22, 2022  4:11 PM. If you have any questions, ask your nurse or doctor.          Accu-Chek Aviva Soln Use as directed as needed.   Accu-Chek Guide Me w/Device Kit Use as instructed to check blood sugars 1 times a day, fasting   accu-chek softclix lancets Use twice daily as instructed. Dx: 250.00   albuterol 108 (90 Base) MCG/ACT inhaler Commonly known as: Ventolin HFA INHALE 2 PUFFS INTO THE LUNGS EVERY 4 HOURS AS NEEDED FOR WHEEZING OR SHORTNESS OF BREATH   allopurinol 100 MG tablet Commonly known as: ZYLOPRIM TAKE 1 TABLET EVERY DAY   aspirin EC 81 MG tablet Take 2 tablets (162 mg total) by mouth daily. Swallow whole.   B-D SINGLE USE SWABS REGULAR Pads Dx: 250.00   cholecalciferol 1000 units tablet Commonly known as: VITAMIN D Take 1,000 Units by mouth every morning.   colchicine 0.6 MG tablet Take 1 tablet (0.6 mg total) by mouth daily.   empagliflozin 25 MG Tabs tablet Commonly known as: Jardiance Take 1 tablet (25 mg total) by mouth daily before breakfast. What changed:  medication strength how much to take Changed by: Dorita Sciara, MD   finasteride 5 MG tablet Commonly known as: PROSCAR TAKE 1 TABLET (5 MG TOTAL) EVERY MORNING.    fluticasone furoate-vilanterol 100-25 MCG/INH Aepb Commonly known as: Breo Ellipta Inhale 1 puff into the lungs daily.   glipiZIDE 5 MG tablet Commonly known as: GLUCOTROL Take 1 tablet (5 mg total) by mouth 2 (two) times daily before a meal.   irbesartan-hydrochlorothiazide 300-12.5 MG tablet Commonly known as: AVALIDE TAKE 1 TABLET EVERY DAY   lovastatin 40 MG tablet Commonly known as: MEVACOR TAKE 1 TABLET EVERY MORNING   metFORMIN 500 MG tablet Commonly known as: GLUCOPHAGE Take 1 tablet (500  mg total) by mouth daily with breakfast.   MULTI-VITAMIN GUMMIES PO Take 1 tablet by mouth daily.   nitroGLYCERIN 0.4 MG SL tablet Commonly known as: NITROSTAT Place 1 tablet (0.4 mg total) under the tongue every 5 (five) minutes as needed for chest pain.   OneTouch Delica Plus 0000000 Misc USE TWICE DAILY AS DIRECTED   Accu-Chek Softclix Lancets lancets Use as instructed to check blood sugars 1 times a day, fasting   OneTouch Ultra test strip Generic drug: glucose blood USE TWICE DAILY AS  INSTRUCTED   Accu-Chek Guide test strip Generic drug: glucose blood Use as instructed, check blood sugars 1 times a day, fasting   pantoprazole 40 MG tablet Commonly known as: PROTONIX TAKE 1 TABLET EVERY MORNING   sildenafil 100 MG tablet Commonly known as: Viagra Take 0.5-1 tablets (50-100 mg total) by mouth daily as needed for erectile dysfunction.   tamsulosin 0.4 MG Caps capsule Commonly known as: FLOMAX TAKE 1 CAPSULE (0.4 MG TOTAL) DAILY.         OBJECTIVE:   Vital Signs: BP 122/70 (BP Location: Left Arm, Patient Position: Sitting, Cuff Size: Large)   Pulse 60   Ht 5' 9"$  (1.753 m)   Wt 226 lb (102.5 kg)   SpO2 97%   BMI 33.37 kg/m   Wt Readings from Last 3 Encounters:  04/22/22 226 lb (102.5 kg)  03/08/22 231 lb (104.8 kg)  12/13/21 229 lb (103.9 kg)     Exam: General: Pt appears well and is in NAD  Lungs: Clear with good BS bilat  Heart: RRR    Extremities: No pretibial edema   Neuro: MS is good with appropriate affect, pt is alert and Ox3    DM foot exam: 08/13/2021     The skin of the feet is intact without sores or ulcerations. The pedal pulses are 2+ on right and 2+ on left. The sensation is intact to a screening 5.07, 10 gram monofilament bilaterally     DATA REVIEWED:  Lab Results  Component Value Date   HGBA1C 9.0 (H) 03/08/2022   HGBA1C 7.9 (H) 12/01/2021   HGBA1C 7.0 (A) 08/13/2021    Latest Reference Range & Units 03/08/22 16:09  Sodium 135 - 145 mEq/L 140  Potassium 3.5 - 5.1 mEq/L 4.7  Chloride 96 - 112 mEq/L 105  CO2 19 - 32 mEq/L 26  Glucose 70 - 99 mg/dL 203 (H)  BUN 6 - 23 mg/dL 21  Creatinine 0.40 - 1.50 mg/dL 1.51 (H)  Calcium 8.4 - 10.5 mg/dL 9.4  Alkaline Phosphatase 39 - 117 U/L 91  Albumin 3.5 - 5.2 g/dL 4.0  Uric Acid, Serum 4.0 - 7.8 mg/dL 6.8  AST 0 - 37 U/L 15  ALT 0 - 53 U/L 16  Total Protein 6.0 - 8.3 g/dL 7.0  Total Bilirubin 0.2 - 1.2 mg/dL 0.4  GFR >60.00 mL/min 44.31 (L)  (H): Data is abnormally high (L): Data is abnormally low   ASSESSMENT / PLAN / RECOMMENDATIONS:   1) Type 2 Diabetes Mellitus, poorly controlled, With neuropathic, CKD iii and macrovascular  complications - Most recent A1c of 9.0%. Goal A1c < 7.0 %.     -Patient with worsening glycemic control due to dietary indiscretion, he also has been taking glipizide once daily instead of twice daily -The patient is motivated to improve his CHO intake -Unable to increase metformin due to low GFR, wife is skeptical about this  -We will increase Jardiance as below   MEDICATIONS:  Increase Jardiance 25 mg daily Continue metformin 500 mg 1 tablet daily Increase glipizide 5 mg BID    EDUCATION / INSTRUCTIONS: BG monitoring instructions: Patient is instructed to check his blood sugars 1 times a day, fasting. Call [facility] if: BG persistently < 70  I reviewed the Rule of 15 for the treatment of  hypoglycemia in detail with the patient. Literature supplied.   2) Diabetic complications:  Eye: Does not have known diabetic retinopathy.  Neuro/ Feet: Does  have known diabetic peripheral neuropathy .  Renal: Patient does have known baseline CKD. He   is  on an ACEI/ARB at present.    3) Urinary Frequency   - Pt advised to decrease liquid intake after 7 pm   F/U in 3 months   Signed electronically by: Mack Guise, MD  [facility]  [facility] [address] [place], [place] [address] Phone: [phone] FAX: [phone]   CC: Cassandria Anger, MD [place] [address] Phone: [phone]  Fax: [phone]  Return to Endocrinology clinic as below: Future Appointments  Date Time Provider [place]  05/04/2022  2:00 PM [place] [facility] None  06/07/2022  2:40 PM Plotnikov, Evie Lacks, MD [facility] None  08/04/2022 11:50 AM Mayla Biddy, Melanie Crazier, MD [facility] None

## 2022-04-22 NOTE — Patient Instructions (Signed)
-   Continue metformin 500 mg 1 tablet daily  - Continue Glipizide 5 mg, 1 tablet Before Breakfast and 1 tablet before Supper - Increase  Jardiance 25 mg daily    HOW TO TREAT LOW BLOOD SUGARS (Blood sugar LESS THAN 70 MG/DL) Please follow the RULE OF 15 for the treatment of hypoglycemia treatment (when your (blood sugars are less than 70 mg/dL)   STEP 1: Take 15 grams of carbohydrates when your blood sugar is low, which includes:  3-4 GLUCOSE TABS  OR 3-4 OZ OF JUICE OR REGULAR SODA OR ONE TUBE OF GLUCOSE GEL    STEP 2: RECHECK blood sugar in 15 MINUTES STEP 3: If your blood sugar is still low at the 15 minute recheck --> then, go back to STEP 1 and treat AGAIN with another 15 grams of carbohydrates.

## 2022-04-27 DIAGNOSIS — M79661 Pain in right lower leg: Secondary | ICD-10-CM | POA: Insufficient documentation

## 2022-04-27 DIAGNOSIS — M79662 Pain in left lower leg: Secondary | ICD-10-CM | POA: Diagnosis not present

## 2022-05-16 ENCOUNTER — Ambulatory Visit (INDEPENDENT_AMBULATORY_CARE_PROVIDER_SITE_OTHER): Payer: Medicare HMO

## 2022-05-16 VITALS — BP 120/60 | HR 61 | Temp 97.8°F | Resp 16 | Ht 69.0 in | Wt 224.0 lb

## 2022-05-16 DIAGNOSIS — Z0000 Encounter for general adult medical examination without abnormal findings: Secondary | ICD-10-CM | POA: Diagnosis not present

## 2022-05-16 NOTE — Progress Notes (Addendum)
Subjective:   Johnny [name] is a 77 y.o. male who presents for Medicare Annual/Subsequent preventive examination.  Review of Systems     Cardiac Risk Factors include: advanced age (>7mn, >>20women);diabetes mellitus;dyslipidemia;family history of premature cardiovascular disease;male gender;hypertension;obesity (BMI >30kg/m2)     Objective:    Today's Vitals   05/16/22 1549  BP: 120/60  Pulse: 61  Resp: 16  Temp: 97.8 F (36.6 C)  SpO2: 99%  Weight: 224 lb (101.6 kg)  Height: '5\' 9"'$  (1.753 m)  PainSc: 0-No pain   Body mass index is 33.08 kg/m.     05/16/2022    4:59 PM 05/03/2021    2:38 PM 02/18/2020    9:45 AM 12/17/2019    4:29 PM 10/13/2016    7:39 AM 12/25/2014   12:57 PM 05/27/2013    8:07 AM  Advanced Directives  Does Patient Have a Medical Advance Directive? No No No No No No Patient does not have advance directive;Patient would not like information  Would patient like information on creating a medical advance directive? Yes (MAU/Ambulatory/Procedural Areas - Information given) No - Patient declined No - Patient declined No - Patient declined No - Patient declined      Current Medications (verified) Outpatient Encounter Medications as of 05/16/2022  Medication Sig   Accu-Chek Softclix Lancets lancets Use as instructed to check blood sugars 1 times a day, fasting   albuterol (VENTOLIN HFA) 108 (90 Base) MCG/ACT inhaler INHALE 2 PUFFS INTO THE LUNGS EVERY 4 HOURS AS NEEDED FOR WHEEZING OR SHORTNESS OF BREATH   Alcohol Swabs (B-D SINGLE USE SWABS REGULAR) PADS Dx: 250.00   allopurinol (ZYLOPRIM) 100 MG tablet TAKE 1 TABLET EVERY DAY   aspirin 81 MG EC tablet Take 2 tablets (162 mg total) by mouth daily. Swallow whole.   Blood Glucose Calibration (ACCU-CHEK AVIVA) SOLN Use as directed as needed.   Blood Glucose Monitoring Suppl (ACCU-CHEK GUIDE ME) w/Device KIT Use as instructed to check blood sugars 1 times a day, fasting   cholecalciferol (VITAMIN D) 1000 UNITS  tablet Take 1,000 Units by mouth every morning.   colchicine 0.6 MG tablet Take 1 tablet (0.6 mg total) by mouth daily.   empagliflozin (JARDIANCE) 25 MG TABS tablet Take 1 tablet (25 mg total) by mouth daily before breakfast.   finasteride (PROSCAR) 5 MG tablet TAKE 1 TABLET (5 MG TOTAL) EVERY MORNING.   fluticasone furoate-vilanterol (BREO ELLIPTA) 100-25 MCG/INH AEPB Inhale 1 puff into the lungs daily.   glipiZIDE (GLUCOTROL) 5 MG tablet Take 1 tablet (5 mg total) by mouth 2 (two) times daily before a meal.   glucose blood (ACCU-CHEK GUIDE) test strip Use as instructed, check blood sugars 1 times a day, fasting   irbesartan-hydrochlorothiazide (AVALIDE) 300-12.5 MG tablet TAKE 1 TABLET EVERY DAY   Lancet Devices (ACCU-CHEK SOFTCLIX) lancets Use twice daily as instructed. Dx: 250.00   Lancets (ONETOUCH DELICA PLUS [id number] MISC USE TWICE DAILY AS DIRECTED   lovastatin (MEVACOR) 40 MG tablet TAKE 1 TABLET EVERY MORNING   metFORMIN (GLUCOPHAGE) 500 MG tablet Take 1 tablet (500 mg total) by mouth daily with breakfast.   Multiple Vitamins-Minerals (MULTI-VITAMIN GUMMIES PO) Take 1 tablet by mouth daily.   nitroGLYCERIN (NITROSTAT) 0.4 MG SL tablet Place 1 tablet (0.4 mg total) under the tongue every 5 (five) minutes as needed for chest pain.   ONETOUCH ULTRA test strip USE TWICE DAILY AS  INSTRUCTED   pantoprazole (PROTONIX) 40 MG tablet TAKE 1 TABLET EVERY MORNING  sildenafil (VIAGRA) 100 MG tablet Take 0.5-1 tablets (50-100 mg total) by mouth daily as needed for erectile dysfunction.   tamsulosin (FLOMAX) 0.4 MG CAPS capsule TAKE 1 CAPSULE (0.4 MG TOTAL) DAILY.   No facility-administered encounter medications on file as of 05/16/2022.    Allergies (verified) Enalapril and Hydrocodone-acetaminophen   History: Past Medical History:  Diagnosis Date   Arthritis    At risk for sleep apnea    STOP-BANG=  6   SENT TO PCP 05-21-2013   Benign positional vertigo    Benign prostatic  hypertrophy    Blood transfusion without reported diagnosis 2000   CAD (coronary artery disease)    CARDIOLOGIST--  DR Cristopher Peru   CHF (congestive heart failure) (HCC)    Chronic ischemic heart disease, unspecified    ED (erectile dysfunction) of organic origin    GERD (gastroesophageal reflux disease)    History of CHF (congestive heart failure)    systolic   HTN (hypertension)    Hyperlipidemia    Tubular adenoma of colon 12/2009   Type 2 diabetes mellitus ([place])    Wears glasses    Past Surgical History:  Procedure Laterality Date   CARDIAC CATHETERIZATION  03-03-2000  dr Carleene Overlie taylor   normal lvsf/  two-vessel cad significant complex stenosis at distal left main   CARDIOVASCULAR STRESS TEST  08-09-2010  DR GREGG TAYLOR   normal lexiscan nuclear study/  no ischemia/  normal lvf/  ef 54%   CATARACT EXTRACTION W/ INTRAOCULAR LENS IMPLANT Right 03/07/2009   COLONOSCOPY  10/09/2020   12/30/2009   CORONARY ARTERY BYPASS GRAFT  03-06-2000   DR GERHARDT   third vessel   PENILE PROSTHESIS IMPLANT  02/08/2010   COLOPLAST 3-PIECE INFLATABLE   PENILE PROSTHESIS IMPLANT N/A 05/27/2013   Procedure: CYSTO REMOVAL OF PENILE PROSTHESIS;  Surgeon: Claybon Jabs, MD;  Location: [facility];  Service: Urology;  Laterality: N/A;   POLYPECTOMY  12/30/2009   +TA   TOTAL HIP ARTHROPLASTY  01/24/2011   Procedure: TOTAL HIP ARTHROPLASTY;  Surgeon: Dione Plover [name];  Location: [facility];  Service: Orthopedics;  Laterality: Left;   TOTAL HIP ARTHROPLASTY Right 01/30/2013   Procedure: RIGHT TOTAL HIP ARTHROPLASTY;  Surgeon: Gearlean Alf, MD;  Location: [facility];  Service: Orthopedics;  Laterality: Right;   Family History  Problem Relation Age of Onset   Hypertension Mother    Diabetes Father    Cancer Sister 13       ovarian ca   Cancer - Other Brother        cancer all over   Coronary artery disease Other        1st degree male relative   Hypertension Other    Colon cancer Neg  Hx    Stomach cancer Neg Hx    Esophageal cancer Neg Hx    Pancreatic cancer Neg Hx    Liver disease Neg Hx    Colon polyps Neg Hx    Rectal cancer Neg Hx    Social History   Socioeconomic History   Marital status: Married    Spouse name: Not on file   Number of children: 2   Years of education: Not on file   Highest education level: Not on file  Occupational History   Occupation: Environmental education officer    Employer: RETIRED  Tobacco Use   Smoking status: Former    Types: Cigarettes    Quit date: 05/22/1970    Years since quitting: 42.0  Smokeless tobacco: Never  Vaping Use   Vaping Use: Never used  Substance and Sexual Activity   Alcohol use: No    Comment: seldom   Drug use: No   Sexual activity: Not Currently  Other Topics Concern   Not on file  Social History Narrative   Regular Exercise-Yes (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)   Married; has 2 daughters   He is a Programme researcher, 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  (05/16/2022)   Overall Financial Resource Strain (CARDIA)    Difficulty of Paying Living Expenses: Not hard at all  Food Insecurity: No Food Insecurity (05/16/2022)   Hunger Vital Sign    Worried About Running Out of Food in the Last Year: Never true    Ran Out of Food in the Last Year: Never true  Transportation Needs: No Transportation Needs (05/16/2022)   PRAPARE - Hydrologist (Medical): No    Lack of Transportation (Non-Medical): No  Physical Activity: Sufficiently Active (05/16/2022)   Exercise Vital Sign    Days of Exercise per Week: 5 days    Minutes of Exercise per Session: 60 min  Stress: No Stress Concern Present (05/16/2022)   [place]    Feeling of Stress : Not at all  Social Connections: [place] (05/16/2022)   Social Connection and Isolation Panel [NHANES]    Frequency of Communication with  Friends and Family: More than three times a week    Frequency of Social Gatherings with Friends and Family: More than three times a week    Attends Religious Services: More than 4 times per year    Active Member of [employer] or Organizations: Yes    Attends Music therapist: More than 4 times per year    Marital Status: Married    Tobacco Counseling Counseling given: Not Answered   Clinical Intake:  Pre-visit preparation completed: Yes  Pain : No/denies pain Pain Score: 0-No pain     BMI - recorded: 33.08 Nutritional Status: BMI > 30  Obese Nutritional Risks: None Diabetes: Yes CBG done?: No Did pt. bring in CBG monitor from home?: No  How often do you need to have someone help you when you read instructions, pamphlets, or other written materials from your doctor or pharmacy?: 1 - Never What is the last grade level you completed in school?: HSG  Nutrition Risk Assessment:  Has the patient had any N/V/D within the last 2 months?  No  Does the patient have any non-healing wounds?  No  Has the patient had any unintentional weight loss or weight gain?  No   Diabetes:  Is the patient diabetic?  Yes  If diabetic, was a CBG obtained today?  No  Did the patient bring in their glucometer from home?  No  How often do you monitor your CBG's? once.   Financial Strains and Diabetes Management:  Are you having any financial strains with the device, your supplies or your medication? No .  Does the patient want to be seen by Chronic Care Management for management of their diabetes?  No  Would the patient like to be referred to a Nutritionist or for Diabetic Management?  No   Diabetic Exams:  Diabetic Eye Exam: Completed 04/01/2022 Diabetic Foot Exam: Completed 08/13/2021   Interpreter Needed?: No  Information entered by :: Lisette Abu, LPN.  Activities of Daily Living    05/16/2022    4:59 PM  In your present state of health, do you have any difficulty  performing the following activities:  Hearing? 0  Vision? 0  Difficulty concentrating or making decisions? 0  Walking or climbing stairs? 0  Dressing or bathing? 0  Doing errands, shopping? 0  Preparing Food and eating ? N  Using the Toilet? N  In the past six months, have you accidently leaked urine? N  Do you have problems with loss of bowel control? N  Managing your Medications? N  Managing your Finances? N  Housekeeping or managing your Housekeeping? N    Patient Care Team: Plotnikov, Evie Lacks, MD as PCP - General Kathie Rhodes, MD (Inactive) (Urology) Gaynelle Arabian, MD (Orthopedic Surgery) Evans Lance, MD (Cardiology) Iran Planas, MD as Consulting Physician (Orthopedic Surgery) Calvert Cantor, MD as Consulting Physician (Ophthalmology) [facility], Melanie Crazier, MD as Attending Physician (Endocrinology)  Indicate any recent Medical Services you may have received from other than [facility] providers in the past year (date may be approximate).     Assessment:   This is a routine wellness examination for [place].  Hearing/Vision screen Hearing Screening - Comments:: Denies hearing difficulties   Vision Screening - Comments:: Wears rx glasses - up to date with routine eye exams with [place] issues and exercise activities discussed: Current Exercise Habits: Home exercise routine;Structured exercise class, Time (Minutes): 60, Frequency (Times/Week): 5, Weekly Exercise (Minutes/Week): 300, Exercise limited by: None identified   Goals Addressed             This Visit's Progress    My goal for 2024 is to travel more.        Depression Screen    05/16/2022    4:57 PM 05/03/2021    2:39 PM 05/03/2021    2:11 PM 02/18/2020    9:46 AM 11/14/2019   10:58 AM 01/25/2018    2:08 PM 10/13/2016    7:39 AM  PHQ 2/9 Scores  PHQ - 2 Score 0 0 0 0 0 0 0  PHQ- 9 Score 0          Fall Risk    05/16/2022    4:59 PM 05/03/2021    2:39 PM 05/03/2021     2:11 PM 02/18/2020    9:46 AM 11/14/2019   10:57 AM  [place] in the past year? 0 0 0 0 0  Number falls in past yr: 0 0 0 0 0  Injury with Fall? 0 0 0 0 0  Risk for fall due to : No Fall Risks No Fall Risks No Fall Risks No Fall Risks   Follow up Falls prevention discussed Falls evaluation completed Falls evaluation completed Falls evaluation completed     [place]:  Any stairs in or around the home? No  If so, are there any without handrails? No  Home free of loose throw rugs in walkways, pet beds, electrical cords, etc? Yes  Adequate lighting in your home to reduce risk of falls? Yes   ASSISTIVE DEVICES UTILIZED TO PREVENT FALLS:  Life alert? No  Use of a cane, walker or w/c? No  Grab bars in the bathroom? Yes  Shower chair or bench in shower? Yes  Elevated toilet seat or a handicapped toilet? Yes   TIMED UP AND GO:  Was the test performed? Yes .  Length of time to  ambulate 10 feet: 8 sec.   Gait steady and fast without use of assistive device  Cognitive Function:        05/16/2022    5:00 PM  6CIT Screen  What Year? 0 points  What month? 0 points  What time? 0 points  Count back from 20 0 points  Months in reverse 0 points  Repeat phrase 0 points  Total Score 0 points    Immunizations Immunization History  Administered Date(s) Administered   Fluad Quad(high Dose 65+) 01/02/2019, 12/25/2019, 11/24/2020, 12/01/2021   Influenza Split 12/16/2011   Influenza Whole 03/02/2007, 12/23/2008, 10/30/2009   Influenza, High Dose Seasonal PF 01/24/2013, 02/08/2016, 12/16/2016, 01/25/2018   Influenza,inj,Quad PF,6+ Mos 11/29/2013, 12/02/2014   Moderna SARS-COV2 Booster Vaccination 03/10/2020   PFIZER(Purple Top)SARS-COV-2 Vaccination 04/10/2019, 05/21/2019   Pfizer Covid-19 Vaccine Bivalent Booster 20yr & up 03/04/2022   Pneumococcal Conjugate-13 06/03/2013   Pneumococcal Polysaccharide-23 10/30/2009, 02/08/2016   Td  03/20/2012   Td (Adult), 2 Lf Tetanus Toxid, Preservative Free 03/20/2012    TDAP status: Due, Education has been provided regarding the importance of this vaccine. Advised may receive this vaccine at local pharmacy or Health Dept. Aware to provide a copy of the vaccination record if obtained from local pharmacy or Health Dept. Verbalized acceptance and understanding.  Flu Vaccine status: Up to date  Pneumococcal vaccine status: Up to date  Covid-19 vaccine status: Completed vaccines  Qualifies for Shingles Vaccine? Yes   Zostavax completed No   Shingrix Completed?: No.    Education has been provided regarding the importance of this vaccine. Patient has been advised to call insurance company to determine out of pocket expense if they have not yet received this vaccine. Advised may also receive vaccine at local pharmacy or Health Dept. Verbalized acceptance and understanding.  Screening Tests Health Maintenance  Topic Date Due   Hepatitis C Screening  Never done   Zoster Vaccines- Shingrix (1 of 2) Never done   Diabetic kidney evaluation - Urine ACR  09/09/2017   DTaP/Tdap/Td (2 - Tdap) 03/20/2022   COVID-19 Vaccine (5 - 2023-24 season) 04/29/2022   FOOT EXAM  08/14/2022   HEMOGLOBIN A1C  09/06/2022   Diabetic kidney evaluation - eGFR measurement  03/09/2023   OPHTHALMOLOGY EXAM  04/02/2023   Medicare Annual Wellness (AWV)  05/16/2023   Pneumonia Vaccine 77 Years old  Completed   INFLUENZA VACCINE  Completed   HPV VACCINES  Aged Out   COLONOSCOPY (Pts 45-473yrInsurance coverage will need to be confirmed)  Discontinued    Health Maintenance  Health Maintenance Due  Topic Date Due   Hepatitis C Screening  Never done   Zoster Vaccines- Shingrix (1 of 2) Never done   Diabetic kidney evaluation - Urine ACR  09/09/2017   DTaP/Tdap/Td (2 - Tdap) 03/20/2022   COVID-19 Vaccine (5 - 2023-24 season) 04/29/2022    Colorectal cancer screening: No longer required.   Lung Cancer  Screening: (Low Dose CT Chest recommended if Age 78-80ears, 30 pack-year currently smoking OR have quit w/in 15years.) does not qualify.   Lung Cancer Screening Referral: no  Additional Screening:  Hepatitis C Screening: does qualify; Completed no  Vision Screening: Recommended annual ophthalmology exams for early detection of glaucoma and other disorders of the eye. Is the patient up to date with their annual eye exam?  Yes  Who is the provider or what is the name of the office in which the patient attends annual eye exams? [facility]  If pt is not established with a provider, would they like to be referred to a provider to establish care? No .   Dental Screening: Recommended annual dental exams for proper oral hygiene  Community Resource Referral / Chronic Care Management: CRR required this visit?  No   CCM required this visit?  No      Plan:     I have personally reviewed and noted the following in the patient's chart:   Medical and social history Use of alcohol, tobacco or illicit drugs  Current medications and supplements including opioid prescriptions. Patient is not currently taking opioid prescriptions. Functional ability and status Nutritional status Physical activity Advanced directives List of other physicians Hospitalizations, surgeries, and ER visits in previous 12 months Vitals Screenings to include cognitive, depression, and falls Referrals and appointments  In addition, I have reviewed and discussed with patient certain preventive protocols, quality metrics, and best practice recommendations. A written personalized care plan for preventive services as well as general preventive health recommendations were provided to patient.     Sheral Flow, LPN   [id number]   Nurse Notes: Normal cognitive status assessed by direct observation by this Nurse Health Advisor. No abnormalities found.    Medical screening examination/treatment/procedure(s)  were performed by non-physician practitioner and as supervising physician I was immediately available for consultation/collaboration.  I agree with above. Lew Dawes, MD

## 2022-05-16 NOTE — Patient Instructions (Addendum)
Johnny [name] , Thank you for taking time to come for your Medicare Wellness Visit. I appreciate your ongoing commitment to your health goals. Please review the following plan we discussed and let me know if I can assist you in the future.   These are the goals we discussed:  Goals      My goal for 2024 is to travel more.        This is a list of the screening recommended for you and due dates:  Health Maintenance  Topic Date Due   Hepatitis C Screening: USPSTF Recommendation to screen - Ages 79-79 yo.  Never done   Zoster (Shingles) Vaccine (1 of 2) Never done   Yearly kidney health urinalysis for diabetes  09/09/2017   DTaP/Tdap/Td vaccine (2 - Tdap) 03/20/2022   COVID-19 Vaccine (5 - 2023-24 season) 04/29/2022   Medicare Annual Wellness Visit  05/03/2022   Complete foot exam   08/14/2022   Hemoglobin A1C  09/06/2022   Yearly kidney function blood test for diabetes  03/09/2023   Eye exam for diabetics  04/02/2023   Pneumonia Vaccine  Completed   Flu Shot  Completed   HPV Vaccine  Aged Out   Colon Cancer Screening  Discontinued    Advanced directives: No  Conditions/risks identified: Yes  Next appointment: Follow up in one year for your annual wellness visit.   Preventive Care 57 Years and Older, Male  Preventive care refers to lifestyle choices and visits with your health care provider that can promote health and wellness. What does preventive care include? A yearly physical exam. This is also called an annual well check. Dental exams once or twice a year. Routine eye exams. Ask your health care provider how often you should have your eyes checked. Personal lifestyle choices, including: Daily care of your teeth and gums. Regular physical activity. Eating a healthy diet. Avoiding tobacco and drug use. Limiting alcohol use. Practicing safe sex. Taking low doses of aspirin every day. Taking vitamin and mineral supplements as recommended by your health care provider. What  happens during an annual well check? The services and screenings done by your health care provider during your annual well check will depend on your age, overall health, lifestyle risk factors, and family history of disease. Counseling  Your health care provider may ask you questions about your: Alcohol use. Tobacco use. Drug use. Emotional well-being. Home and relationship well-being. Sexual activity. Eating habits. History of falls. Memory and ability to understand (cognition). Work and work Statistician. Screening  You may have the following tests or measurements: Height, weight, and BMI. Blood pressure. Lipid and cholesterol levels. These may be checked every 5 years, or more frequently if you are over 58 years old. Skin check. Lung cancer screening. You may have this screening every year starting at age 83 if you have a 30-pack-year history of smoking and currently smoke or have quit within the past 15 years. Fecal occult blood test (FOBT) of the stool. You may have this test every year starting at age 35. Flexible sigmoidoscopy or colonoscopy. You may have a sigmoidoscopy every 5 years or a colonoscopy every 10 years starting at age 65. Prostate cancer screening. Recommendations will vary depending on your family history and other risks. Hepatitis C blood test. Hepatitis B blood test. Sexually transmitted disease (STD) testing. Diabetes screening. This is done by checking your blood sugar (glucose) after you have not eaten for a while (fasting). You may have this done every 1-3 years.  Abdominal aortic aneurysm (AAA) screening. You may need this if you are a current or former smoker. Osteoporosis. You may be screened starting at age 39 if you are at high risk. Talk with your health care provider about your test results, treatment options, and if necessary, the need for more tests. Vaccines  Your health care provider may recommend certain vaccines, such as: Influenza vaccine. This  is recommended every year. Tetanus, diphtheria, and acellular pertussis (Tdap, Td) vaccine. You may need a Td booster every 10 years. Zoster vaccine. You may need this after age 46. Pneumococcal 13-valent conjugate (PCV13) vaccine. One dose is recommended after age 46. Pneumococcal polysaccharide (PPSV23) vaccine. One dose is recommended after age 3. Talk to your health care provider about which screenings and vaccines you need and how often you need them. This information is not intended to replace advice given to you by your health care provider. Make sure you discuss any questions you have with your health care provider. Document Released: 03/20/2015 Document Revised: 11/11/2015 Document Reviewed: 12/23/2014 Elsevier Interactive Patient Education  2017 [place] Prevention in the Home Falls can cause injuries. They can happen to people of all ages. There are many things you can do to make your home safe and to help prevent falls. What can I do on the outside of my home? Regularly fix the edges of walkways and driveways and fix any cracks. Remove anything that might make you trip as you walk through a door, such as a raised step or threshold. Trim any bushes or trees on the path to your home. Use bright outdoor lighting. Clear any walking paths of anything that might make someone trip, such as rocks or tools. Regularly check to see if handrails are loose or broken. Make sure that both sides of any steps have handrails. Any raised decks and porches should have guardrails on the edges. Have any leaves, snow, or ice cleared regularly. Use sand or salt on walking paths during winter. Clean up any spills in your garage right away. This includes oil or grease spills. What can I do in the bathroom? Use night lights. Install grab bars by the toilet and in the tub and shower. Do not use towel bars as grab bars. Use non-skid mats or decals in the tub or shower. If you need to sit down  in the shower, use a plastic, non-slip stool. Keep the floor dry. Clean up any water that spills on the floor as soon as it happens. Remove soap buildup in the tub or shower regularly. Attach bath mats securely with double-sided non-slip rug tape. Do not have throw rugs and other things on the floor that can make you trip. What can I do in the bedroom? Use night lights. Make sure that you have a light by your bed that is easy to reach. Do not use any sheets or blankets that are too big for your bed. They should not hang down onto the floor. Have a firm chair that has side arms. You can use this for support while you get dressed. Do not have throw rugs and other things on the floor that can make you trip. What can I do in the kitchen? Clean up any spills right away. Avoid walking on wet floors. Keep items that you use a lot in easy-to-reach places. If you need to reach something above you, use a strong step stool that has a grab bar. Keep electrical cords out of the way. Do not  use floor polish or wax that makes floors slippery. If you must use wax, use non-skid floor wax. Do not have throw rugs and other things on the floor that can make you trip. What can I do with my stairs? Do not leave any items on the stairs. Make sure that there are handrails on both sides of the stairs and use them. Fix handrails that are broken or loose. Make sure that handrails are as long as the stairways. Check any carpeting to make sure that it is firmly attached to the stairs. Fix any carpet that is loose or worn. Avoid having throw rugs at the top or bottom of the stairs. If you do have throw rugs, attach them to the floor with carpet tape. Make sure that you have a light switch at the top of the stairs and the bottom of the stairs. If you do not have them, ask someone to add them for you. What else can I do to help prevent falls? Wear shoes that: Do not have high heels. Have rubber bottoms. Are comfortable  and fit you well. Are closed at the toe. Do not wear sandals. If you use a stepladder: Make sure that it is fully opened. Do not climb a closed stepladder. Make sure that both sides of the stepladder are locked into place. Ask someone to hold it for you, if possible. Clearly mark and make sure that you can see: Any grab bars or handrails. First and last steps. Where the edge of each step is. Use tools that help you move around (mobility aids) if they are needed. These include: Canes. Walkers. Scooters. Crutches. Turn on the lights when you go into a dark area. Replace any light bulbs as soon as they burn out. Set up your furniture so you have a clear path. Avoid moving your furniture around. If any of your floors are uneven, fix them. If there are any pets around you, be aware of where they are. Review your medicines with your doctor. Some medicines can make you feel dizzy. This can increase your chance of falling. Ask your doctor what other things that you can do to help prevent falls. This information is not intended to replace advice given to you by your health care provider. Make sure you discuss any questions you have with your health care provider. Document Released: 12/18/2008 Document Revised: 07/30/2015 Document Reviewed: 03/28/2014 Elsevier Interactive Patient Education  2017 [employer].

## 2022-06-07 ENCOUNTER — Encounter: Payer: Self-pay | Admitting: Internal Medicine

## 2022-06-07 ENCOUNTER — Ambulatory Visit (INDEPENDENT_AMBULATORY_CARE_PROVIDER_SITE_OTHER): Payer: Medicare HMO | Admitting: Internal Medicine

## 2022-06-07 VITALS — BP 128/60 | HR 82 | Temp 98.7°F | Ht 69.0 in | Wt 223.0 lb

## 2022-06-07 DIAGNOSIS — R29898 Other symptoms and signs involving the musculoskeletal system: Secondary | ICD-10-CM | POA: Diagnosis not present

## 2022-06-07 DIAGNOSIS — E785 Hyperlipidemia, unspecified: Secondary | ICD-10-CM

## 2022-06-07 DIAGNOSIS — E1159 Type 2 diabetes mellitus with other circulatory complications: Secondary | ICD-10-CM | POA: Diagnosis not present

## 2022-06-07 DIAGNOSIS — M10079 Idiopathic gout, unspecified ankle and foot: Secondary | ICD-10-CM

## 2022-06-07 LAB — COMPREHENSIVE METABOLIC PANEL
ALT: 13 U/L (ref 0–53)
AST: 15 U/L (ref 0–37)
Albumin: 4.2 g/dL (ref 3.5–5.2)
Alkaline Phosphatase: 82 U/L (ref 39–117)
BUN: 27 mg/dL — ABNORMAL HIGH (ref 6–23)
CO2: 27 mEq/L (ref 19–32)
Calcium: 9.4 mg/dL (ref 8.4–10.5)
Chloride: 103 mEq/L (ref 96–112)
Creatinine, Ser: 1.65 mg/dL — ABNORMAL HIGH (ref 0.40–1.50)
GFR: 39.77 mL/min — ABNORMAL LOW (ref >60.00)
Glucose, Bld: 135 mg/dL — ABNORMAL HIGH (ref 70–99)
Potassium: 4.4 mEq/L (ref 3.5–5.1)
Sodium: 136 mEq/L (ref 135–145)
Total Bilirubin: 0.5 mg/dL (ref 0.2–1.2)
Total Protein: 7.4 g/dL (ref 6.0–8.3)

## 2022-06-07 LAB — LIPID PANEL
Cholesterol: 200 mg/dL (ref 0–200)
HDL: 34.6 mg/dL — ABNORMAL LOW (ref >39.00)
NonHDL: 164.94
Total CHOL/HDL Ratio: 6
Triglycerides: 299 mg/dL — ABNORMAL HIGH (ref 0.0–149.0)
VLDL: 59.8 mg/dL — ABNORMAL HIGH (ref 0.0–40.0)

## 2022-06-07 LAB — HEMOGLOBIN A1C: Hgb A1c MFr Bld: 7.9 % — ABNORMAL HIGH (ref 4.6–6.5)

## 2022-06-07 MED ORDER — METHYLPREDNISOLONE 4 MG PO TBPK: ORAL_TABLET | ORAL | 0 refills | Status: DC

## 2022-06-07 NOTE — Assessment & Plan Note (Signed)
No relapse Medrol pack

## 2022-06-07 NOTE — Assessment & Plan Note (Addendum)
New Hold Lovastatin x 1-2 weeks Arterial Doppler ultrasound if not better

## 2022-06-07 NOTE — Patient Instructions (Addendum)
Hold Lovastatin x 1-2 weeks.  May need to order Arterial Doppler ultrasound if not better

## 2022-06-07 NOTE — Assessment & Plan Note (Signed)
On Lovastatin 

## 2022-06-07 NOTE — Progress Notes (Signed)
Subjective:  Patient ID: Johnny [name], male    DOB: 1945/05/06  Age: 77 y.o. MRN: [id number]  CC: Follow-up (3 MNTH F/U)   HPI Mylo Red Pagliarulo presents for DM, HTN, CAD Brother died B calves get tired quick  Outpatient Medications Prior to Visit  Medication Sig Dispense Refill   Accu-Chek Softclix Lancets lancets Use as instructed to check blood sugars 1 times a day, fasting 100 each 12   albuterol (VENTOLIN HFA) 108 (90 Base) MCG/ACT inhaler INHALE 2 PUFFS INTO THE LUNGS EVERY 4 HOURS AS NEEDED FOR WHEEZING OR SHORTNESS OF BREATH 18 g 5   Alcohol Swabs (B-D SINGLE USE SWABS REGULAR) PADS Dx: 250.00 100 each 3   allopurinol (ZYLOPRIM) 100 MG tablet TAKE 1 TABLET EVERY DAY 90 tablet 3   aspirin 81 MG EC tablet Take 2 tablets (162 mg total) by mouth daily. Swallow whole. 100 tablet 3   Blood Glucose Calibration (ACCU-CHEK AVIVA) SOLN Use as directed as needed. 1 each 2   Blood Glucose Monitoring Suppl (ACCU-CHEK GUIDE ME) w/Device KIT Use as instructed to check blood sugars 1 times a day, fasting 1 kit 0   cholecalciferol (VITAMIN D) 1000 UNITS tablet Take 1,000 Units by mouth every morning.     colchicine 0.6 MG tablet Take 1 tablet (0.6 mg total) by mouth daily. 90 tablet 3   empagliflozin (JARDIANCE) 25 MG TABS tablet Take 1 tablet (25 mg total) by mouth daily before breakfast. 90 tablet 3   finasteride (PROSCAR) 5 MG tablet TAKE 1 TABLET (5 MG TOTAL) EVERY MORNING. 90 tablet 3   fluticasone furoate-vilanterol (BREO ELLIPTA) 100-25 MCG/INH AEPB Inhale 1 puff into the lungs daily. 1 each 5   glipiZIDE (GLUCOTROL) 5 MG tablet Take 1 tablet (5 mg total) by mouth 2 (two) times daily before a meal. 180 tablet 3   glucose blood (ACCU-CHEK GUIDE) test strip Use as instructed, check blood sugars 1 times a day, fasting 100 each 12   irbesartan-hydrochlorothiazide (AVALIDE) 300-12.5 MG tablet TAKE 1 TABLET EVERY DAY 90 tablet 3   Lancet Devices (ACCU-CHEK SOFTCLIX) lancets Use twice daily as  instructed. Dx: 250.00 1 each 3   Lancets (ONETOUCH DELICA PLUS 123XX123) MISC USE TWICE DAILY AS DIRECTED 200 each 3   lovastatin (MEVACOR) 40 MG tablet TAKE 1 TABLET EVERY MORNING 90 tablet 3   metFORMIN (GLUCOPHAGE) 500 MG tablet Take 1 tablet (500 mg total) by mouth daily with breakfast. 90 tablet 3   Multiple Vitamins-Minerals (MULTI-VITAMIN GUMMIES PO) Take 1 tablet by mouth daily.     nitroGLYCERIN (NITROSTAT) 0.4 MG SL tablet Place 1 tablet (0.4 mg total) under the tongue every 5 (five) minutes as needed for chest pain. 25 tablet 3   ONETOUCH ULTRA test strip USE TWICE DAILY AS  INSTRUCTED 200 strip 3   pantoprazole (PROTONIX) 40 MG tablet TAKE 1 TABLET EVERY MORNING 90 tablet 3   sildenafil (VIAGRA) 100 MG tablet Take 0.5-1 tablets (50-100 mg total) by mouth daily as needed for erectile dysfunction. 12 tablet 11   tamsulosin (FLOMAX) 0.4 MG CAPS capsule TAKE 1 CAPSULE (0.4 MG TOTAL) DAILY. 90 capsule 3   No facility-administered medications prior to visit.    ROS: Review of Systems  Constitutional:  Negative for appetite change, fatigue and unexpected weight change.  HENT:  Negative for congestion, nosebleeds, sneezing, sore throat and trouble swallowing.   Eyes:  Negative for itching and visual disturbance.  Respiratory:  Negative for cough.   Cardiovascular:  Negative for chest pain, palpitations and leg swelling.  Gastrointestinal:  Negative for abdominal distention, blood in stool, diarrhea and nausea.  Genitourinary:  Negative for frequency and hematuria.  Musculoskeletal:  Positive for arthralgias and gait problem. Negative for back pain, joint swelling and neck pain.  Skin:  Negative for rash.  Neurological:  Negative for dizziness, tremors, speech difficulty and weakness.  Psychiatric/Behavioral:  Negative for agitation, behavioral problems, dysphoric mood and sleep disturbance. The patient is not nervous/anxious.     Objective:  BP 128/60 (BP Location: Left Arm,  Patient Position: Sitting, Cuff Size: Normal)   Pulse 82   Temp 98.7 F (37.1 C) (Oral)   Ht 5\' 9"  (1.753 m)   Wt 223 lb (101.2 kg)   SpO2 94%   BMI 32.93 kg/m   BP Readings from Last 3 Encounters:  06/07/22 128/60  05/16/22 120/60  04/22/22 122/70    Wt Readings from Last 3 Encounters:  06/07/22 223 lb (101.2 kg)  05/16/22 224 lb (101.6 kg)  04/22/22 226 lb (102.5 kg)    Physical Exam Constitutional:      General: He is not in acute distress.    Appearance: He is well-developed.     Comments: NAD  Eyes:     Conjunctiva/sclera: Conjunctivae normal.     Pupils: Pupils are equal, round, and reactive to light.  Neck:     Thyroid: No thyromegaly.     Vascular: No JVD.  Cardiovascular:     Rate and Rhythm: Normal rate and regular rhythm.     Heart sounds: Normal heart sounds. No murmur heard.    No friction rub. No gallop.  Pulmonary:     Effort: Pulmonary effort is normal. No respiratory distress.     Breath sounds: Normal breath sounds. No wheezing or rales.  Chest:     Chest Shirer: No tenderness.  Abdominal:     General: Bowel sounds are normal. There is no distension.     Palpations: Abdomen is soft. There is no mass.     Tenderness: There is no abdominal tenderness. There is no guarding or rebound.  Musculoskeletal:        General: No tenderness. Normal range of motion.     Cervical back: Normal range of motion.  Lymphadenopathy:     Cervical: No cervical adenopathy.  Skin:    General: Skin is warm and dry.     Findings: No rash.  Neurological:     Mental Status: He is alert and oriented to person, place, and time.     Cranial Nerves: No cranial nerve deficit.     Motor: No weakness or abnormal muscle tone.     Coordination: Coordination normal.     Gait: Gait normal.     Deep Tendon Reflexes: Reflexes are normal and symmetric.  Psychiatric:        Behavior: Behavior normal.        Thought Content: Thought content normal.        Judgment: Judgment  normal.     Lab Results  Component Value Date   WBC 7.1 12/16/2019   HGB 12.4 (L) 12/16/2019   HCT 36.5 (L) 12/16/2019   PLT 230.0 12/16/2019   GLUCOSE 203 (H) 03/08/2022   CHOL 139 07/27/2020   TRIG 206.0 (H) 07/27/2020   HDL 28.90 (L) 07/27/2020   LDLDIRECT 83.0 07/27/2020   LDLCALC 74 11/29/2013   ALT 16 03/08/2022   AST 15 03/08/2022   NA 140 03/08/2022   K  4.7 03/08/2022   CL 105 03/08/2022   CREATININE 1.51 (H) 03/08/2022   BUN 21 03/08/2022   CO2 26 03/08/2022   TSH 6.61 (H) 03/08/2022   PSA 1.51 12/01/2021   INR 1.15 01/21/2013   HGBA1C 9.0 (H) 03/08/2022   MICROALBUR <0.7 09/09/2016    VAS [place] LOWER EXTREMITY VENOUS (DVT)  Result Date: 12/17/2019  Lower Venous DVTStudy Indications: Right calf pain/ tightness.  Comparison Study: none Performing Technologist: June Leap RDMS, RVT  Examination Guidelines: A complete evaluation includes B-mode imaging, spectral Doppler, color Doppler, and power Doppler as needed of all accessible portions of each vessel. Bilateral testing is considered an integral part of a complete examination. Limited examinations for reoccurring indications may be performed as noted. The reflux portion of the exam is performed with the patient in reverse Trendelenburg.  +-----+---------------+---------+-----------+----------+--------------+ RIGHTCompressibilityPhasicitySpontaneityPropertiesThrombus Aging +-----+---------------+---------+-----------+----------+--------------+ CFV  Full           Yes      Yes                                 +-----+---------------+---------+-----------+----------+--------------+   +---------+---------------+---------+-----------+----------+--------------+ LEFT     CompressibilityPhasicitySpontaneityPropertiesThrombus Aging +---------+---------------+---------+-----------+----------+--------------+ CFV      Full           Yes      Yes                                  +---------+---------------+---------+-----------+----------+--------------+ SFJ      Full                                                        +---------+---------------+---------+-----------+----------+--------------+ FV Prox  Full                                                        +---------+---------------+---------+-----------+----------+--------------+ FV Mid   Partial        Yes      Yes                  Acute          +---------+---------------+---------+-----------+----------+--------------+ FV DistalNone           No       No                   Acute          +---------+---------------+---------+-----------+----------+--------------+ PFV      Full                                                        +---------+---------------+---------+-----------+----------+--------------+ POP      None           No       No                   Acute          +---------+---------------+---------+-----------+----------+--------------+  PTV      None                                         Acute          +---------+---------------+---------+-----------+----------+--------------+ PERO     None                                         Acute          +---------+---------------+---------+-----------+----------+--------------+ Gastroc  None                                         Acute          +---------+---------------+---------+-----------+----------+--------------+ GSV      Full                                                        +---------+---------------+---------+-----------+----------+--------------+    Findings reported to [facility] @ Dr. Ronnald Ramp' office at 3:18 pm.  Summary: RIGHT: - No evidence of common femoral vein obstruction.  LEFT: - Findings consistent with acute deep vein thrombosis involving the left femoral vein, left popliteal vein, left posterior tibial veins, left peroneal veins, and left gastrocnemius veins.  *See table(s) above for  measurements and observations. Electronically signed by Monica Martinez MD on 12/17/2019 at 4:40:56 PM.    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     Follow-up: No follow-ups on file.  Walker Kehr, MD

## 2022-06-08 LAB — LDL CHOLESTEROL, DIRECT: Direct LDL: 93 mg/dL

## 2022-06-08 LAB — MICROALBUMIN / CREATININE URINE RATIO
Creatinine,U: 57.2 mg/dL
Microalb Creat Ratio: 1.2 mg/g (ref 0.0–30.0)
Microalb, Ur: <0.7 mg/dL (ref 0.0–1.9)

## 2022-07-04 ENCOUNTER — Ambulatory Visit (HOSPITAL_COMMUNITY)
Admission: EM | Admit: 2022-07-04 | Discharge: 2022-07-04 | Disposition: A | Discharge status: Home / Self Care | Payer: Medicare HMO | Attending: Family Medicine | Admitting: Family Medicine

## 2022-07-04 ENCOUNTER — Encounter (HOSPITAL_COMMUNITY): Payer: Self-pay

## 2022-07-04 DIAGNOSIS — R220 Localized swelling, mass and lump, head: Secondary | ICD-10-CM | POA: Diagnosis not present

## 2022-07-04 MED ORDER — TRIAMCINOLONE ACETONIDE 40 MG/ML IJ SUSP
INTRAMUSCULAR | Status: AC
  Filled 2022-07-04: qty 1

## 2022-07-04 MED ORDER — CEPHALEXIN 500 MG PO CAPS: 500.0000 mg | ORAL_CAPSULE | Freq: Three times a day (TID) | ORAL | 0 refills | Status: AC

## 2022-07-04 MED ORDER — TRIAMCINOLONE ACETONIDE 40 MG/ML IJ SUSP
40.0000 mg | Freq: Once | INTRAMUSCULAR | Status: AC
  Administered 2022-07-04: 40 mg via INTRAMUSCULAR

## 2022-07-04 NOTE — ED Provider Notes (Signed)
[facility]    CSN: [id number] Arrival date & time: 07/04/22  1100      History   Chief Complaint Chief Complaint  Patient presents with   Eye Pain    HPI Johnny [name] is a 77 y.o. male.    Eye Pain   Here for swelling of his left cheek around his left eye.  This first noticed this morning when he awoke.  No fever and no itching.  He does feel like to him that he is reacting to something.  No trouble with his vision.  His diabetes is doing fairly well  He states he has not taken steroids recently.   No fever and no itching  Past Medical History:  Diagnosis Date   Arthritis    At risk for sleep apnea    STOP-BANG=  6   SENT TO PCP 05-21-2013   Benign positional vertigo    Benign prostatic hypertrophy    Blood transfusion without reported diagnosis 2000   CAD (coronary artery disease)    CARDIOLOGIST--  DR Lewayne Bunting   CHF (congestive heart failure) (HCC)    Chronic ischemic heart disease, unspecified    ED (erectile dysfunction) of organic origin    GERD (gastroesophageal reflux disease)    History of CHF (congestive heart failure)    systolic   HTN (hypertension)    Hyperlipidemia    Tubular adenoma of colon 12/2009   Type 2 diabetes mellitus (HCC)    Wears glasses     Patient Active Problem List   Diagnosis Date Noted   Weakness of both legs 06/07/2022   Bilateral calf pain 04/27/2022   Diabetes mellitus (HCC) 12/13/2021   Type 2 diabetes mellitus with diabetic polyneuropathy, without long-term current use of insulin (HCC) 08/13/2021   Type 2 diabetes mellitus with stage 3b chronic kidney disease, without long-term current use of insulin (HCC) 08/13/2021   Tophus of olecranon bursa of both elbows due to gout 07/27/2020   CTS (carpal tunnel syndrome) 07/27/2020   Cerumen impaction 05/04/2020   Left leg DVT (HCC) 01/13/2020   Pure hyperglyceridemia 12/16/2019   Hyperlipidemia LDL goal <70 12/16/2019   Pain and swelling of lower leg,  left 12/16/2019   Bilateral hearing loss 07/11/2016   BPPV (benign paroxysmal positional vertigo), unspecified laterality 07/11/2016   Gout attack 06/15/2016   Erectile dysfunction 07/13/2015   Well adult exam 06/12/2015   OA (osteoarthritis) of hip 01/30/2013   Osteoarthritis of right hip 10/19/2012   Primary osteoarthritis of left hip 01/26/2011   COSTOCHONDRITIS 08/10/2009   TOBACCO USE, QUIT 12/23/2008   BLADDER OUTLET OBSTRUCTION 10/24/2008   DEGENERATIVE JOINT DISEASE, LEFT HIP 10/08/2007   ISCHEMIC HEART DISEASE 08/07/2007   Arthropathy 08/07/2007   DM type 2 causing vascular disease (HCC) 03/02/2007   Dyslipidemia 03/02/2007   Essential hypertension 03/02/2007   Coronary atherosclerosis 03/02/2007   GERD 03/02/2007    Past Surgical History:  Procedure Laterality Date   CARDIAC CATHETERIZATION  03-03-2000  dr gregg taylor   normal lvsf/  two-vessel cad significant complex stenosis at distal left main   CARDIOVASCULAR STRESS TEST  08-09-2010  DR GREGG TAYLOR   normal lexiscan nuclear study/  no ischemia/  normal lvf/  ef 54%   CATARACT EXTRACTION W/ INTRAOCULAR LENS IMPLANT Right 03/07/2009   COLONOSCOPY  10/09/2020   12/30/2009   CORONARY ARTERY BYPASS GRAFT  03-06-2000   DR GERHARDT   third vessel   PENILE PROSTHESIS IMPLANT  02/08/2010  COLOPLAST 3-PIECE INFLATABLE   PENILE PROSTHESIS IMPLANT N/A 05/27/2013   Procedure: CYSTO REMOVAL OF PENILE PROSTHESIS;  Surgeon: Garnett Farm, MD;  Location: [facility];  Service: Urology;  Laterality: N/A;   POLYPECTOMY  12/30/2009   +TA   TOTAL HIP ARTHROPLASTY  01/24/2011   Procedure: TOTAL HIP ARTHROPLASTY;  Surgeon: Gus Rankin [name];  Location: [facility];  Service: Orthopedics;  Laterality: Left;   TOTAL HIP ARTHROPLASTY Right 01/30/2013   Procedure: RIGHT TOTAL HIP ARTHROPLASTY;  Surgeon: Loanne Drilling, MD;  Location: [facility];  Service: Orthopedics;  Laterality: Right;       Home Medications    Prior  to Admission medications   Medication Sig Start Date End Date Taking? Authorizing Provider  Accu-Chek Softclix Lancets lancets Use as instructed to check blood sugars 1 times a day, fasting 10/28/21  Yes Shamleffer, Konrad Dolores, MD  albuterol (VENTOLIN HFA) 108 (90 Base) MCG/ACT inhaler INHALE 2 PUFFS INTO THE LUNGS EVERY 4 HOURS AS NEEDED FOR WHEEZING OR SHORTNESS OF BREATH 11/26/21  Yes Plotnikov, Georgina Quint, MD  allopurinol (ZYLOPRIM) 100 MG tablet TAKE 1 TABLET EVERY DAY 01/04/22  Yes Plotnikov, Georgina Quint, MD  aspirin 81 MG EC tablet Take 2 tablets (162 mg total) by mouth daily. Swallow whole. 07/27/20  Yes Plotnikov, Georgina Quint, MD  cephALEXin (KEFLEX) 500 MG capsule Take 1 capsule (500 mg total) by mouth 3 (three) times daily for 5 days. 07/04/22 07/09/22 Yes Zenia Resides, MD  cholecalciferol (VITAMIN D) 1000 UNITS tablet Take 1,000 Units by mouth every morning.   Yes [provider]  colchicine 0.6 MG tablet Take 1 tablet (0.6 mg total) by mouth daily. 05/04/20  Yes Plotnikov, Georgina Quint, MD  empagliflozin (JARDIANCE) 25 MG TABS tablet Take 1 tablet (25 mg total) by mouth daily before breakfast. 04/22/22  Yes Shamleffer, Konrad Dolores, MD  finasteride (PROSCAR) 5 MG tablet TAKE 1 TABLET (5 MG TOTAL) EVERY MORNING. 01/04/22  Yes Plotnikov, Georgina Quint, MD  fluticasone furoate-vilanterol (BREO ELLIPTA) 100-25 MCG/INH AEPB Inhale 1 puff into the lungs daily. 07/13/15  Yes Plotnikov, Georgina Quint, MD  glipiZIDE (GLUCOTROL) 5 MG tablet Take 1 tablet (5 mg total) by mouth 2 (two) times daily before a meal. 12/06/21  Yes Plotnikov, Georgina Quint, MD  glucose blood (ACCU-CHEK GUIDE) test strip Use as instructed, check blood sugars 1 times a day, fasting 10/28/21  Yes Shamleffer, Konrad Dolores, MD  irbesartan-hydrochlorothiazide (AVALIDE) 300-12.5 MG tablet TAKE 1 TABLET EVERY DAY 01/04/22  Yes Plotnikov, Georgina Quint, MD  Lancet Devices [facility]) lancets Use twice daily as instructed. Dx: 250.00  10/24/14  Yes Plotnikov, Georgina Quint, MD  Lancets (ONETOUCH DELICA PLUS LANCET33G) MISC USE TWICE DAILY AS DIRECTED 01/11/20  Yes Plotnikov, Georgina Quint, MD  lovastatin (MEVACOR) 40 MG tablet TAKE 1 TABLET EVERY MORNING 02/15/22  Yes Plotnikov, Georgina Quint, MD  metFORMIN (GLUCOPHAGE) 500 MG tablet Take 1 tablet (500 mg total) by mouth daily with breakfast. 08/13/21  Yes Shamleffer, Konrad Dolores, MD  methylPREDNISolone (MEDROL DOSEPAK) 4 MG TBPK tablet As directed 06/07/22  Yes Plotnikov, Georgina Quint, MD  Multiple Vitamins-Minerals (MULTI-VITAMIN GUMMIES PO) Take 1 tablet by mouth daily. 04/15/16  Yes [provider]  ONETOUCH ULTRA test strip USE TWICE DAILY AS  INSTRUCTED 10/20/19  Yes Plotnikov, Georgina Quint, MD  pantoprazole (PROTONIX) 40 MG tablet TAKE 1 TABLET EVERY MORNING 02/15/22  Yes Plotnikov, Georgina Quint, MD  sildenafil (VIAGRA) 100 MG tablet Take 0.5-1 tablets (50-100 mg  total) by mouth daily as needed for erectile dysfunction. 11/13/21  Yes Plotnikov, Georgina Quint, MD  tamsulosin (FLOMAX) 0.4 MG CAPS capsule TAKE 1 CAPSULE (0.4 MG TOTAL) DAILY. 01/04/22  Yes Plotnikov, Georgina Quint, MD  Alcohol Swabs (B-D SINGLE USE SWABS REGULAR) PADS Dx: 250.00 10/28/21   Shamleffer, Konrad Dolores, MD  Blood Glucose Calibration (ACCU-CHEK AVIVA) SOLN Use as directed as needed. 10/27/21   Plotnikov, Georgina Quint, MD  Blood Glucose Monitoring Suppl (ACCU-CHEK GUIDE ME) w/Device KIT Use as instructed to check blood sugars 1 times a day, fasting 10/28/21   Shamleffer, Konrad Dolores, MD  nitroGLYCERIN (NITROSTAT) 0.4 MG SL tablet Place 1 tablet (0.4 mg total) under the tongue every 5 (five) minutes as needed for chest pain. 09/01/16   Marinus Maw, MD    Family History Family History  Problem Relation Age of Onset   Hypertension Mother    Diabetes Father    Cancer Sister 21       ovarian ca   Cancer - Other Brother        cancer all over   Coronary artery disease Other        1st degree male relative    Hypertension Other    Colon cancer Neg Hx    Stomach cancer Neg Hx    Esophageal cancer Neg Hx    Pancreatic cancer Neg Hx    Liver disease Neg Hx    Colon polyps Neg Hx    Rectal cancer Neg Hx     Social History Social History   Tobacco Use   Smoking status: Former    Types: Cigarettes    Quit date: 05/22/1970    Years since quitting: 52.1   Smokeless tobacco: Never  Vaping Use   Vaping Use: Never used  Substance Use Topics   Alcohol use: No    Comment: seldom   Drug use: No     Allergies   Enalapril and Hydrocodone-acetaminophen   Review of Systems Review of Systems  Eyes:  Positive for pain.     Physical Exam Triage Vital Signs ED Triage Vitals [07/04/22 1257]  Enc Vitals Group     BP (!) 148/70     Pulse Rate 69     Resp 16     Temp 97.9 F (36.6 C)     Temp Source Oral     SpO2 98 %     Weight      Height      Head Circumference      Peak Flow      Pain Score      Pain Loc      Pain Edu?      Excl. in GC?    No data found.  Updated Vital Signs BP (!) 148/70 (BP Location: Left Arm)   Pulse 69   Temp 97.9 F (36.6 C) (Oral)   Resp 16   SpO2 98%   Visual Acuity Right Eye Distance:   Left Eye Distance:   Bilateral Distance:    Right Eye Near:   Left Eye Near:    Bilateral Near:     Physical Exam Vitals reviewed.  Constitutional:      General: He is not in acute distress.    Appearance: He is not ill-appearing, toxic-appearing or diaphoretic.  HENT:     Head:     Comments: There is some mild swelling of the left cheek inferior to the left eye.  There is very mild pink erythema.  Upper eyelid is not swollen.    Nose: Nose normal.     Mouth/Throat:     Mouth: Mucous membranes are moist.  Eyes:     Extraocular Movements: Extraocular movements intact.     Conjunctiva/sclera: Conjunctivae normal.     Pupils: Pupils are equal, round, and reactive to light.  Skin:    Coloration: Skin is not jaundiced or pale.  Neurological:      General: No focal deficit present.     Mental Status: He is alert and oriented to person, place, and time.      UC Treatments / Results  Labs (all labs ordered are listed, but only abnormal results are displayed) Labs Reviewed - No data to display  EKG   Radiology No results found.  Procedures Procedures (including critical care time)  Medications Ordered in UC Medications  triamcinolone acetonide (KENALOG-40) injection 40 mg (has no administration in time range)    Initial Impression / Assessment and Plan / UC Course  I have reviewed the triage vital signs and the nursing notes.  Pertinent labs & imaging results that were available during my care of the patient were reviewed by me and considered in my medical decision making (see chart for details).        I am going to treat with a steroid injection here and 5 days of Keflex.  Also I have made recommendations for over-the-counter antihistamines. Final Clinical Impressions(s) / UC Diagnoses   Final diagnoses:  Left facial swelling     Discharge Instructions      You have been given an injection of triamcinolone 40 mg  Take cephalexin 500 mg--1 capsule 3 times daily for 5 days  You could also take Claritin or Allegra or Zyrtec as needed for allergic reaction/itching      ED Prescriptions     Medication Sig Dispense Auth. Provider   cephALEXin (KEFLEX) 500 MG capsule Take 1 capsule (500 mg total) by mouth 3 (three) times daily for 5 days. 15 capsule Marlinda Mike Janace Aris, MD      PDMP not reviewed this encounter.   Zenia Resides, MD 07/04/22 1328

## 2022-07-04 NOTE — ED Triage Notes (Signed)
Pt presents to the office for L eye pain and swelling.Pt states he woke up this morning with eye swelling.

## 2022-07-04 NOTE — Discharge Instructions (Signed)
You have been given an injection of triamcinolone 40 mg  Take cephalexin 500 mg--1 capsule 3 times daily for 5 days  You could also take Claritin or Allegra or Zyrtec as needed for allergic reaction/itching

## 2022-08-04 ENCOUNTER — Ambulatory Visit: Payer: Medicare HMO | Admitting: Internal Medicine

## 2022-08-04 ENCOUNTER — Other Ambulatory Visit (INDEPENDENT_AMBULATORY_CARE_PROVIDER_SITE_OTHER): Payer: Medicare HMO

## 2022-08-04 ENCOUNTER — Encounter: Payer: Self-pay | Admitting: Internal Medicine

## 2022-08-04 VITALS — BP 124/72 | HR 85 | Ht 69.0 in | Wt 220.0 lb

## 2022-08-04 DIAGNOSIS — Z7984 Long term (current) use of oral hypoglycemic drugs: Secondary | ICD-10-CM | POA: Diagnosis not present

## 2022-08-04 DIAGNOSIS — E1142 Type 2 diabetes mellitus with diabetic polyneuropathy: Secondary | ICD-10-CM

## 2022-08-04 DIAGNOSIS — E1159 Type 2 diabetes mellitus with other circulatory complications: Secondary | ICD-10-CM | POA: Diagnosis not present

## 2022-08-04 DIAGNOSIS — E1122 Type 2 diabetes mellitus with diabetic chronic kidney disease: Secondary | ICD-10-CM

## 2022-08-04 DIAGNOSIS — N1832 Chronic kidney disease, stage 3b: Secondary | ICD-10-CM | POA: Diagnosis not present

## 2022-08-04 LAB — POCT GLUCOSE (DEVICE FOR HOME USE): POC Glucose: 158 mg/dl — AB (ref 70–99)

## 2022-08-04 NOTE — Progress Notes (Signed)
Name: Johnny [name]  Age/ Sex: 77 y.o., male   MRN/ DOB: [id number], Jul 23, 1945     PCP: Tresa Garter, MD   Reason for Endocrinology Evaluation: Type 2 Diabetes Mellitus  Initial Endocrine Consultative Visit: 09/15/2016    PATIENT IDENTIFIER: Johnny [name] is a 77 y.o. male with a past medical history of T2DM, CAD, HTN and dyslipidemia . The patient has followed with Endocrinology clinic since 09/15/2016 for consultative assistance with management of his diabetes.  DIABETIC HISTORY:  Johnny [name] was diagnosed with DM 1999, has been on oral glycemic agents since his dx . His hemoglobin A1c has ranged from 6.9% in 2021, peaking at 9.3% in 2022.  Transitioned from Dr. Everardo All 08/2021   On his initial visit with me in June 2023 ,he had an A1c of 7.0%,   he was on metformin, repaglinide, bromocriptine, and Rybelsus.  Patient was not taking repaglinide  (unknown reason ), we stopped bromocriptine,  increased Rybelsus, but the patient contacted the office stating that it is cost prohibitive.  So we started glipizide in June 2023    SUBJECTIVE:   During the last visit (04/22/2022): A1c 9.0%  Today (08/04/2022): Johnny [name] is here for a follow up on diabetes management. He checks his blood sugars 1 times daily.   Pt had an ED visit 06/2022 for left eye and facial swelling , which has resolved    Denies nausea, vomiting  Continues with  occasional constipation    HOME DIABETES REGIMEN:  Metformin 500 mg daily  Glipizide 5 mg twice daily Jardiance 25 mg daily      Statin: yes ACE-I/ARB: yes    METER DOWNLOAD SUMMARY: n/a     DIABETIC COMPLICATIONS: Microvascular complications:  CKD III, neuropathy Denies: retinopathy Last Eye Exam: Completed 04/08/2022  Macrovascular complications:  CAD ( S/P CABG ) Denies:  CVA, PVD   HISTORY:  Past Medical History:  Past Medical History:  Diagnosis Date   Arthritis    At risk for sleep apnea    STOP-BANG=  6   SENT TO  PCP 05-21-2013   Benign positional vertigo    Benign prostatic hypertrophy    Blood transfusion without reported diagnosis 2000   CAD (coronary artery disease)    CARDIOLOGIST--  DR Lewayne Bunting   CHF (congestive heart failure) (HCC)    Chronic ischemic heart disease, unspecified    ED (erectile dysfunction) of organic origin    GERD (gastroesophageal reflux disease)    History of CHF (congestive heart failure)    systolic   HTN (hypertension)    Hyperlipidemia    Tubular adenoma of colon 12/2009   Type 2 diabetes mellitus (HCC)    Wears glasses    Past Surgical History:  Past Surgical History:  Procedure Laterality Date   CARDIAC CATHETERIZATION  03-03-2000  dr Sharlot Gowda taylor   normal lvsf/  two-vessel cad significant complex stenosis at distal left main   CARDIOVASCULAR STRESS TEST  08-09-2010  DR GREGG TAYLOR   normal lexiscan nuclear study/  no ischemia/  normal lvf/  ef 54%   CATARACT EXTRACTION W/ INTRAOCULAR LENS IMPLANT Right 03/07/2009   COLONOSCOPY  10/09/2020   12/30/2009   CORONARY ARTERY BYPASS GRAFT  03-06-2000   DR GERHARDT   third vessel   PENILE PROSTHESIS IMPLANT  02/08/2010   COLOPLAST 3-PIECE INFLATABLE   PENILE PROSTHESIS IMPLANT N/A 05/27/2013   Procedure: CYSTO REMOVAL OF PENILE PROSTHESIS;  Surgeon: Garnett Farm, MD;  Location: [facility];  Service: Urology;  Laterality: N/A;   POLYPECTOMY  12/30/2009   +TA   TOTAL HIP ARTHROPLASTY  01/24/2011   Procedure: TOTAL HIP ARTHROPLASTY;  Surgeon: Gus Rankin [name];  Location: [facility];  Service: Orthopedics;  Laterality: Left;   TOTAL HIP ARTHROPLASTY Right 01/30/2013   Procedure: RIGHT TOTAL HIP ARTHROPLASTY;  Surgeon: Loanne Drilling, MD;  Location: [facility];  Service: Orthopedics;  Laterality: Right;   Social History:  reports that he quit smoking about 52 years ago. His smoking use included cigarettes. He has never used smokeless tobacco. He reports that he does not drink alcohol and does not  use drugs. Family History:  Family History  Problem Relation Age of Onset   Hypertension Mother    Diabetes Father    Cancer Sister 68       ovarian ca   Cancer - Other Brother        cancer all over   Coronary artery disease Other        1st degree male relative   Hypertension Other    Colon cancer Neg Hx    Stomach cancer Neg Hx    Esophageal cancer Neg Hx    Pancreatic cancer Neg Hx    Liver disease Neg Hx    Colon polyps Neg Hx    Rectal cancer Neg Hx      HOME MEDICATIONS: Allergies as of 08/04/2022       Reactions   Enalapril    SOB   Hydrocodone-acetaminophen Nausea Only   Can take codein        Medication List        Accurate as of Aug 04, 2022 12:25 PM. If you have any questions, ask your nurse or doctor.          STOP taking these medications    methylPREDNISolone 4 MG Tbpk tablet Commonly known as: MEDROL DOSEPAK Stopped by: Scarlette Shorts, MD       TAKE these medications    Accu-Chek Aviva Soln Use as directed as needed.   Accu-Chek Guide Me w/Device Kit Use as instructed to check blood sugars 1 times a day, fasting   accu-chek softclix lancets Use twice daily as instructed. Dx: 250.00   albuterol 108 (90 Base) MCG/ACT inhaler Commonly known as: Ventolin HFA INHALE 2 PUFFS INTO THE LUNGS EVERY 4 HOURS AS NEEDED FOR WHEEZING OR SHORTNESS OF BREATH   allopurinol 100 MG tablet Commonly known as: ZYLOPRIM TAKE 1 TABLET EVERY DAY   aspirin EC 81 MG tablet Take 2 tablets (162 mg total) by mouth daily. Swallow whole.   B-D SINGLE USE SWABS REGULAR Pads Dx: 250.00   cholecalciferol 1000 units tablet Commonly known as: VITAMIN D Take 1,000 Units by mouth every morning.   colchicine 0.6 MG tablet Take 1 tablet (0.6 mg total) by mouth daily.   empagliflozin 25 MG Tabs tablet Commonly known as: Jardiance Take 1 tablet (25 mg total) by mouth daily before breakfast.   finasteride 5 MG tablet Commonly known as: PROSCAR TAKE  1 TABLET (5 MG TOTAL) EVERY MORNING.   fluticasone furoate-vilanterol 100-25 MCG/INH Aepb Commonly known as: Breo Ellipta Inhale 1 puff into the lungs daily.   glipiZIDE 5 MG tablet Commonly known as: GLUCOTROL Take 1 tablet (5 mg total) by mouth 2 (two) times daily before a meal.   irbesartan-hydrochlorothiazide 300-12.5 MG tablet Commonly known as: AVALIDE TAKE 1 TABLET EVERY DAY   lovastatin 40 MG tablet Commonly known  as: MEVACOR TAKE 1 TABLET EVERY MORNING   metFORMIN 500 MG tablet Commonly known as: GLUCOPHAGE Take 1 tablet (500 mg total) by mouth daily with breakfast.   MULTI-VITAMIN GUMMIES PO Take 1 tablet by mouth daily.   nitroGLYCERIN 0.4 MG SL tablet Commonly known as: NITROSTAT Place 1 tablet (0.4 mg total) under the tongue every 5 (five) minutes as needed for chest pain.   OneTouch Delica Plus Lancet33G Misc USE TWICE DAILY AS DIRECTED   Accu-Chek Softclix Lancets lancets Use as instructed to check blood sugars 1 times a day, fasting   OneTouch Ultra test strip Generic drug: glucose blood USE TWICE DAILY AS  INSTRUCTED   Accu-Chek Guide test strip Generic drug: glucose blood Use as instructed, check blood sugars 1 times a day, fasting   pantoprazole 40 MG tablet Commonly known as: PROTONIX TAKE 1 TABLET EVERY MORNING   sildenafil 100 MG tablet Commonly known as: Viagra Take 0.5-1 tablets (50-100 mg total) by mouth daily as needed for erectile dysfunction.   tamsulosin 0.4 MG Caps capsule Commonly known as: FLOMAX TAKE 1 CAPSULE (0.4 MG TOTAL) DAILY.         OBJECTIVE:   Vital Signs: BP 124/72 (BP Location: Left Arm, Patient Position: Sitting, Cuff Size: Large)   Pulse 85   Ht 5\' 9"  (1.753 m)   Wt 220 lb (99.8 kg)   SpO2 97%   BMI 32.49 kg/m   Wt Readings from Last 3 Encounters:  08/04/22 220 lb (99.8 kg)  06/07/22 223 lb (101.2 kg)  05/16/22 224 lb (101.6 kg)     Exam: General: Pt appears well and is in NAD  Lungs: Clear  with good BS bilat  Heart: RRR   Extremities: No pretibial edema   Neuro: MS is good with appropriate affect, pt is alert and Ox3    DM foot exam: 08/04/2022  The skin of the feet is intact without sores or ulcerations. The pedal pulses are 1+  The sensation is intact to a screening 5.07, 10 gram monofilament bilaterally     DATA REVIEWED:  Lab Results  Component Value Date   HGBA1C 7.9 (H) 06/07/2022   HGBA1C 9.0 (H) 03/08/2022   HGBA1C 7.9 (H) 12/01/2021    Latest Reference Range & Units 08/04/22 14:58  Sodium 135 - 145 mEq/L 137  Potassium 3.5 - 5.1 mEq/L 4.8  Chloride 96 - 112 mEq/L 103  CO2 19 - 32 mEq/L 26  Glucose 70 - 99 mg/dL 86  BUN 6 - 23 mg/dL 33 (H)  Creatinine 4.69 - 1.50 mg/dL 6.29 (H)  Calcium 8.4 - 10.5 mg/dL 9.1  GFR >52.84 mL/min 33.54 (L)     ASSESSMENT / PLAN / RECOMMENDATIONS:   1) Type 2 Diabetes Mellitus, Sub-optimally  controlled, With neuropathic, CKD III  and macrovascular  complications - Most recent A1c of 7.9%. Goal A1c < 7.0 %.    -A1c has trended down from 9.0% to 7.9% -GFR is below 35, will discontinue metformin -Will continue Jardiance and glipizide -GLP-1 agonist has been cost prohibitive, I did offer patient assistance but the patient under the impression that the spouse have done that in the past without success, I will continue to revisit this in the future -Will encourage hydration  MEDICATIONS: Stop metformin  Continue Jardiance 25 mg daily Continue glipizide 5 mg BID    EDUCATION / INSTRUCTIONS: BG monitoring instructions: Patient is instructed to check his blood sugars 1 times a day, fasting. Call [facility] if:  BG persistently < 70  I reviewed the Rule of 15 for the treatment of hypoglycemia in detail with the patient. Literature supplied.   2) Diabetic complications:  Eye: Does not have known diabetic retinopathy.  Neuro/ Feet: Does  have known diabetic peripheral neuropathy .  Renal: Patient  does have known baseline CKD. He   is  on an ACEI/ARB at present.    3) Urinary Frequency   - Pt advised to decrease liquid intake after 7 pm      F/U in 4 months   Signed electronically by: Lyndle Herrlich, MD  [facility]  [facility] [address] [place], [place] [address] Phone: [phone] FAX: [phone]   CC: Tresa Garter, MD [address] [place] [address] Phone: [phone]  Fax: [phone]  Return to Endocrinology clinic as below: Future Appointments  Date Time Provider Department Center  09/06/2022  1:40 PM Plotnikov, Georgina Quint, MD [facility] None  05/16/2023  3:30 PM [facility]-ANNUAL WELLNESS VISIT [facility] None

## 2022-08-04 NOTE — Patient Instructions (Addendum)
Metformin 500 mg, 1 tablet before Breakfast  Glipizide 5 mg , 1 tablet before Breakfast and 1 tablet before Supper Jardiance 25 mg , 1 tablet before Breakfast      HOW TO TREAT LOW BLOOD SUGARS (Blood sugar LESS THAN 70 MG/DL) Please follow the RULE OF 15 for the treatment of hypoglycemia treatment (when your (blood sugars are less than 70 mg/dL)   STEP 1: Take 15 grams of carbohydrates when your blood sugar is low, which includes:  3-4 GLUCOSE TABS  OR 3-4 OZ OF JUICE OR REGULAR SODA OR ONE TUBE OF GLUCOSE GEL    STEP 2: RECHECK blood sugar in 15 MINUTES STEP 3: If your blood sugar is still low at the 15 minute recheck --> then, go back to STEP 1 and treat AGAIN with another 15 grams of carbohydrates.

## 2022-08-05 ENCOUNTER — Telehealth: Payer: Self-pay | Admitting: Internal Medicine

## 2022-08-05 LAB — BASIC METABOLIC PANEL
BUN: 33 mg/dL — ABNORMAL HIGH (ref 6–23)
CO2: 26 mEq/L (ref 19–32)
Calcium: 9.1 mg/dL (ref 8.4–10.5)
Chloride: 103 mEq/L (ref 96–112)
Creatinine, Ser: 1.9 mg/dL — ABNORMAL HIGH (ref 0.40–1.50)
GFR: 33.54 mL/min — ABNORMAL LOW (ref >60.00)
Glucose, Bld: 86 mg/dL (ref 70–99)
Potassium: 4.8 mEq/L (ref 3.5–5.1)
Sodium: 137 mEq/L (ref 135–145)

## 2022-08-05 NOTE — Telephone Encounter (Signed)
Please let the patient know that his kidney function continues to be low, and he needs to STOP metformin  Patient MUST increase his water intake, as he is dehydrated and needs more water to improve his kidney function     Continue Jardiance 1 tablet a day Continue glipizide 2 tablets a day   Thank you

## 2022-08-05 NOTE — Telephone Encounter (Signed)
Pt has been notified and voices understanding.  

## 2022-08-13 ENCOUNTER — Other Ambulatory Visit: Payer: Self-pay | Admitting: Internal Medicine

## 2022-09-06 ENCOUNTER — Encounter: Payer: Self-pay | Admitting: Internal Medicine

## 2022-09-06 ENCOUNTER — Ambulatory Visit (INDEPENDENT_AMBULATORY_CARE_PROVIDER_SITE_OTHER): Payer: Medicare HMO | Admitting: Internal Medicine

## 2022-09-06 VITALS — BP 120/60 | Temp 98.6°F | Ht 69.0 in | Wt 220.0 lb

## 2022-09-06 DIAGNOSIS — E1142 Type 2 diabetes mellitus with diabetic polyneuropathy: Secondary | ICD-10-CM

## 2022-09-06 DIAGNOSIS — E1159 Type 2 diabetes mellitus with other circulatory complications: Secondary | ICD-10-CM

## 2022-09-06 DIAGNOSIS — E785 Hyperlipidemia, unspecified: Secondary | ICD-10-CM | POA: Diagnosis not present

## 2022-09-06 DIAGNOSIS — M79604 Pain in right leg: Secondary | ICD-10-CM

## 2022-09-06 DIAGNOSIS — I824Y2 Acute embolism and thrombosis of unspecified deep veins of left proximal lower extremity: Secondary | ICD-10-CM | POA: Diagnosis not present

## 2022-09-06 DIAGNOSIS — I2510 Atherosclerotic heart disease of native coronary artery without angina pectoris: Secondary | ICD-10-CM | POA: Diagnosis not present

## 2022-09-06 DIAGNOSIS — M79606 Pain in leg, unspecified: Secondary | ICD-10-CM | POA: Insufficient documentation

## 2022-09-06 DIAGNOSIS — I10 Essential (primary) hypertension: Secondary | ICD-10-CM | POA: Diagnosis not present

## 2022-09-06 DIAGNOSIS — M79605 Pain in left leg: Secondary | ICD-10-CM

## 2022-09-06 LAB — COMPREHENSIVE METABOLIC PANEL
ALT: 13 U/L (ref 0–53)
AST: 14 U/L (ref 0–37)
Albumin: 3.9 g/dL (ref 3.5–5.2)
Alkaline Phosphatase: 77 U/L (ref 39–117)
BUN: 32 mg/dL — ABNORMAL HIGH (ref 6–23)
CO2: 26 mEq/L (ref 19–32)
Calcium: 9.4 mg/dL (ref 8.4–10.5)
Chloride: 102 mEq/L (ref 96–112)
Creatinine, Ser: 1.98 mg/dL — ABNORMAL HIGH (ref 0.40–1.50)
GFR: 31.9 mL/min — ABNORMAL LOW (ref >60.00)
Glucose, Bld: 140 mg/dL — ABNORMAL HIGH (ref 70–99)
Potassium: 4.4 mEq/L (ref 3.5–5.1)
Sodium: 135 mEq/L (ref 135–145)
Total Bilirubin: 0.4 mg/dL (ref 0.2–1.2)
Total Protein: 7.1 g/dL (ref 6.0–8.3)

## 2022-09-06 NOTE — Assessment & Plan Note (Addendum)
DVT Doing well

## 2022-09-06 NOTE — Assessment & Plan Note (Signed)
On Lovastatin - no side effect per pt

## 2022-09-06 NOTE — Assessment & Plan Note (Signed)
  Re-start Jardiance (never took it) 03/2022 

## 2022-09-06 NOTE — Assessment & Plan Note (Signed)
Chronic Cont on Losartan, Lovastatin, ASA 

## 2022-09-06 NOTE — Assessment & Plan Note (Signed)
Chronic Follow-up with Dr Shamleffer Continue on metformin, Glipizide Cont on Losartan 

## 2022-09-06 NOTE — Assessment & Plan Note (Addendum)
Due to a lot of exercise. Exercise QOD On Lovastatin - no side effect per pt Check arterial Doppler to rule out peripheral arterial disease

## 2022-09-06 NOTE — Progress Notes (Signed)
Subjective:  Patient ID: Johnny [name], male    DOB: February 22, 1946  Age: 77 y.o. MRN: [id number]  CC: Follow-up (3 mnth f/u)   HPI Johnny [name] presents for LLE DVT C/o B calves pain by the end of 45 min exercise x long time Follow-up on diabetes, hypertension  Outpatient Medications Prior to Visit  Medication Sig Dispense Refill   Accu-Chek Softclix Lancets lancets Use as instructed to check blood sugars 1 times a day, fasting 100 each 12   albuterol (VENTOLIN HFA) 108 (90 Base) MCG/ACT inhaler INHALE 2 PUFFS INTO THE LUNGS EVERY 4 HOURS AS NEEDED FOR WHEEZING OR SHORTNESS OF BREATH 18 g 5   Alcohol Swabs (B-D SINGLE USE SWABS REGULAR) PADS Dx: 250.00 100 each 3   allopurinol (ZYLOPRIM) 100 MG tablet TAKE 1 TABLET EVERY DAY 90 tablet 3   aspirin 81 MG EC tablet Take 2 tablets (162 mg total) by mouth daily. Swallow whole. 100 tablet 3   Blood Glucose Calibration (ACCU-CHEK AVIVA) SOLN Use as directed as needed. 1 each 2   Blood Glucose Monitoring Suppl (ACCU-CHEK GUIDE ME) w/Device KIT Use as instructed to check blood sugars 1 times a day, fasting 1 kit 0   cholecalciferol (VITAMIN D) 1000 UNITS tablet Take 1,000 Units by mouth every morning.     colchicine 0.6 MG tablet Take 1 tablet (0.6 mg total) by mouth daily. 90 tablet 3   empagliflozin (JARDIANCE) 25 MG TABS tablet Take 1 tablet (25 mg total) by mouth daily before breakfast. 90 tablet 3   finasteride (PROSCAR) 5 MG tablet TAKE 1 TABLET (5 MG TOTAL) EVERY MORNING. 90 tablet 3   fluticasone furoate-vilanterol (BREO ELLIPTA) 100-25 MCG/INH AEPB Inhale 1 puff into the lungs daily. 1 each 5   glipiZIDE (GLUCOTROL) 5 MG tablet Take 1 tablet (5 mg total) by mouth 2 (two) times daily before a meal. 90 tablet 0   glucose blood (ACCU-CHEK GUIDE) test strip Use as instructed, check blood sugars 1 times a day, fasting 100 each 12   irbesartan-hydrochlorothiazide (AVALIDE) 300-12.5 MG tablet TAKE 1 TABLET EVERY DAY 90 tablet 3   Lancet  Devices (ACCU-CHEK SOFTCLIX) lancets Use twice daily as instructed. Dx: 250.00 1 each 3   Lancets (ONETOUCH DELICA PLUS LANCET33G) MISC USE TWICE DAILY AS DIRECTED 200 each 3   lovastatin (MEVACOR) 40 MG tablet TAKE 1 TABLET EVERY MORNING 90 tablet 3   Multiple Vitamins-Minerals (MULTI-VITAMIN GUMMIES PO) Take 1 tablet by mouth daily.     nitroGLYCERIN (NITROSTAT) 0.4 MG SL tablet Place 1 tablet (0.4 mg total) under the tongue every 5 (five) minutes as needed for chest pain. 25 tablet 3   ONETOUCH ULTRA test strip USE TWICE DAILY AS  INSTRUCTED 200 strip 3   pantoprazole (PROTONIX) 40 MG tablet TAKE 1 TABLET EVERY MORNING 90 tablet 3   sildenafil (VIAGRA) 100 MG tablet Take 0.5-1 tablets (50-100 mg total) by mouth daily as needed for erectile dysfunction. 12 tablet 11   tamsulosin (FLOMAX) 0.4 MG CAPS capsule TAKE 1 CAPSULE (0.4 MG TOTAL) DAILY. 90 capsule 3   No facility-administered medications prior to visit.    ROS: Review of Systems  Constitutional:  Negative for appetite change, fatigue and unexpected weight change.  HENT:  Negative for congestion, nosebleeds, sneezing, sore throat and trouble swallowing.   Eyes:  Negative for itching and visual disturbance.  Respiratory:  Negative for cough.   Cardiovascular:  Negative for chest pain, palpitations and leg swelling.  Gastrointestinal:  Negative for abdominal distention, blood in stool, diarrhea and nausea.  Genitourinary:  Negative for frequency and hematuria.  Musculoskeletal:  Positive for arthralgias and gait problem. Negative for back pain, joint swelling and neck pain.  Skin:  Negative for rash.  Neurological:  Negative for dizziness, tremors, speech difficulty and weakness.  Psychiatric/Behavioral:  Negative for agitation, dysphoric mood and sleep disturbance. The patient is not nervous/anxious.     Objective:  BP 120/60 (BP Location: Left Arm, Patient Position: Sitting, Cuff Size: Large)   Temp 98.6 F (37 C) (Oral)   Ht  5\' 9"  (1.753 m)   Wt 220 lb (99.8 kg)   BMI 32.49 kg/m   BP Readings from Last 3 Encounters:  09/06/22 120/60  08/04/22 124/72  07/04/22 (!) 148/70    Wt Readings from Last 3 Encounters:  09/06/22 220 lb (99.8 kg)  08/04/22 220 lb (99.8 kg)  06/07/22 223 lb (101.2 kg)    Physical Exam Constitutional:      General: He is not in acute distress.    Appearance: He is well-developed.     Comments: NAD  Eyes:     Conjunctiva/sclera: Conjunctivae normal.     Pupils: Pupils are equal, round, and reactive to light.  Neck:     Thyroid: No thyromegaly.     Vascular: No JVD.  Cardiovascular:     Rate and Rhythm: Normal rate and regular rhythm.     Heart sounds: Normal heart sounds. No murmur heard.    No friction rub. No gallop.  Pulmonary:     Effort: Pulmonary effort is normal. No respiratory distress.     Breath sounds: Normal breath sounds. No wheezing or rales.  Chest:     Chest [place]: No tenderness.  Abdominal:     General: Bowel sounds are normal. There is no distension.     Palpations: Abdomen is soft. There is no mass.     Tenderness: There is no abdominal tenderness. There is no guarding or rebound.  Musculoskeletal:        General: No tenderness. Normal range of motion.     Cervical back: Normal range of motion.     Right lower leg: No edema.     Left lower leg: No edema.  Lymphadenopathy:     Cervical: No cervical adenopathy.  Skin:    General: Skin is warm and dry.     Findings: No rash.  Neurological:     Mental Status: He is alert and oriented to person, place, and time.     Cranial Nerves: No cranial nerve deficit.     Motor: No abnormal muscle tone.     Coordination: Coordination normal.     Gait: Gait normal.     Deep Tendon Reflexes: Reflexes are normal and symmetric.  Psychiatric:        Behavior: Behavior normal.        Thought Content: Thought content normal.        Judgment: Judgment normal.     Lab Results  Component Value Date   WBC 7.1  12/16/2019   HGB 12.4 (L) 12/16/2019   HCT 36.5 (L) 12/16/2019   PLT 230.0 12/16/2019   GLUCOSE 140 (H) 09/06/2022   CHOL 200 06/07/2022   TRIG 299.0 (H) 06/07/2022   HDL 34.60 (L) 06/07/2022   LDLDIRECT 93.0 06/07/2022   LDLCALC 74 11/29/2013   ALT 13 09/06/2022   AST 14 09/06/2022   NA 135 09/06/2022   K 4.4 09/06/2022  CL 102 09/06/2022   CREATININE 1.98 (H) 09/06/2022   BUN 32 (H) 09/06/2022   CO2 26 09/06/2022   TSH 6.61 (H) 03/08/2022   PSA 1.51 12/01/2021   INR 1.15 01/21/2013   HGBA1C 7.6 (H) 09/06/2022   MICROALBUR <0.7 06/07/2022    No results found.  Assessment & Plan:   Problem List Items Addressed This Visit     DM type 2 causing vascular disease (HCC)    Chronic Follow-up with Dr Lonzo Cloud Continue on metformin, Glipizide Cont on Losartan      Relevant Orders   Comprehensive metabolic panel (Completed)   Hemoglobin A1c (Completed)   Dyslipidemia    On Lovastatin - no side effect per pt      Essential hypertension     Re-start Jardiance (never took it) 03/2022      Coronary atherosclerosis    Chronic Cont on Losartan, Lovastatin, ASA      Left leg DVT (HCC) - Primary    DVT Doing well      Type 2 diabetes mellitus with diabetic polyneuropathy, without long-term current use of insulin (HCC)    Monitor A1c      Leg pain    Due to a lot of exercise. Exercise QOD On Lovastatin - no side effect per pt Check arterial Doppler to rule out peripheral arterial disease      Relevant Orders   VAS [place] LOWER EXT ART SEG MULTI (SEGMENTALS & LE RAYNAUDS) (Completed)      No orders of the defined types were placed in this encounter.     Follow-up: Return in about 3 months (around 12/07/2022) for a follow-up visit.  Sonda Primes, MD

## 2022-09-07 LAB — HEMOGLOBIN A1C: Hgb A1c MFr Bld: 7.6 % — ABNORMAL HIGH (ref 4.6–6.5)

## 2022-09-12 ENCOUNTER — Ambulatory Visit (HOSPITAL_COMMUNITY)
Admission: RE | Admit: 2022-09-12 | Discharge: 2022-09-12 | Disposition: A | Discharge status: Home / Self Care | Payer: Medicare HMO | Source: Ambulatory Visit | Attending: Internal Medicine | Admitting: Internal Medicine

## 2022-09-12 DIAGNOSIS — M79604 Pain in right leg: Secondary | ICD-10-CM | POA: Insufficient documentation

## 2022-09-12 DIAGNOSIS — M79605 Pain in left leg: Secondary | ICD-10-CM | POA: Insufficient documentation

## 2022-09-12 LAB — VAS US LOWER EXT ART SEG MULTI (SEGMENTALS & LE RAYNAUDS): Right ABI: 0.59

## 2022-09-13 ENCOUNTER — Encounter: Payer: Self-pay | Admitting: Internal Medicine

## 2022-09-13 DIAGNOSIS — I739 Peripheral vascular disease, unspecified: Secondary | ICD-10-CM | POA: Insufficient documentation

## 2022-09-13 LAB — VAS US LOWER EXT ART SEG MULTI (SEGMENTALS & LE RAYNAUDS): Left ABI: 0.9

## 2022-09-25 NOTE — Assessment & Plan Note (Signed)
Monitor A1c 

## 2022-09-29 ENCOUNTER — Other Ambulatory Visit: Payer: Self-pay | Admitting: Internal Medicine

## 2022-09-29 DIAGNOSIS — E1142 Type 2 diabetes mellitus with diabetic polyneuropathy: Secondary | ICD-10-CM

## 2022-11-14 ENCOUNTER — Encounter: Payer: Self-pay | Admitting: Internal Medicine

## 2022-11-14 ENCOUNTER — Ambulatory Visit: Payer: Medicare HMO | Admitting: Internal Medicine

## 2022-11-14 VITALS — BP 120/80 | HR 66 | Ht 69.0 in | Wt 221.0 lb

## 2022-11-14 DIAGNOSIS — E1122 Type 2 diabetes mellitus with diabetic chronic kidney disease: Secondary | ICD-10-CM | POA: Diagnosis not present

## 2022-11-14 DIAGNOSIS — E1159 Type 2 diabetes mellitus with other circulatory complications: Secondary | ICD-10-CM

## 2022-11-14 DIAGNOSIS — Z7984 Long term (current) use of oral hypoglycemic drugs: Secondary | ICD-10-CM | POA: Diagnosis not present

## 2022-11-14 DIAGNOSIS — E1142 Type 2 diabetes mellitus with diabetic polyneuropathy: Secondary | ICD-10-CM

## 2022-11-14 DIAGNOSIS — N1832 Chronic kidney disease, stage 3b: Secondary | ICD-10-CM | POA: Diagnosis not present

## 2022-11-14 LAB — POCT GLYCOSYLATED HEMOGLOBIN (HGB A1C): Hemoglobin A1C: 8.1 % — AB (ref 4.0–5.6)

## 2022-11-14 MED ORDER — RYBELSUS 7 MG PO TABS
7.0000 mg | ORAL_TABLET | Freq: Every day | ORAL | Status: DC
Start: 2022-11-14 — End: 2023-03-29

## 2022-11-14 NOTE — Progress Notes (Signed)
Name: Johnny [name]  Age/ Sex: 77 y.o., male   MRN/ DOB: [id number], May 14, 1945     PCP: Tresa Garter, MD   Reason for Endocrinology Evaluation: Type 2 Diabetes Mellitus  Initial Endocrine Consultative Visit: 09/15/2016    PATIENT IDENTIFIER: Johnny [name] is a 77 y.o. male with a past medical history of T2DM, CAD, HTN and dyslipidemia . The patient has followed with Endocrinology clinic since 09/15/2016 for consultative assistance with management of his diabetes.  DIABETIC HISTORY:  Johnny [name] was diagnosed with DM 1999, has been on oral glycemic agents since his dx . His hemoglobin A1c has ranged from 6.9% in 2021, peaking at 9.3% in 2022.  Transitioned from Dr. Everardo All 08/2021   On his initial visit with me in June 2023 ,he had an A1c of 7.0%,   he was on metformin, repaglinide, bromocriptine, and Rybelsus.  Patient was not taking repaglinide  (unknown reason ), we stopped bromocriptine,  increased Rybelsus, but the patient contacted the office stating that it is cost prohibitive.  So we started glipizide in June 2023  Stopped metformin 07/2022 due to low GFR  SUBJECTIVE:   During the last visit (08/04/2022): A1c 7.9%     Today (11/14/2022): Johnny [name] is here for a follow up on diabetes management.  He is accompanied by his spouse today.  He checks his blood sugars 1 times daily.     Denies nausea, vomiting  Denies diarrhea but has occasional constipation     HOME DIABETES REGIMEN:  Glipizide 5 mg twice daily Jardiance 25 mg daily      Statin: yes ACE-I/ARB: yes    METER DOWNLOAD SUMMARY: unable to download   [id number] 235    DIABETIC COMPLICATIONS: Microvascular complications:  CKD III, neuropathy Denies: retinopathy Last Eye Exam: Completed 04/08/2022  Macrovascular complications:  CAD ( S/P CABG ) Denies:  CVA, PVD   HISTORY:  Past Medical History:  Past Medical History:  Diagnosis Date   Arthritis    At risk for sleep apnea     STOP-BANG=  6   SENT TO PCP 05-21-2013   Benign positional vertigo    Benign prostatic hypertrophy    Blood transfusion without reported diagnosis 2000   CAD (coronary artery disease)    CARDIOLOGIST--  DR Lewayne Bunting   CHF (congestive heart failure) (HCC)    Chronic ischemic heart disease, unspecified    ED (erectile dysfunction) of organic origin    GERD (gastroesophageal reflux disease)    History of CHF (congestive heart failure)    systolic   HTN (hypertension)    Hyperlipidemia    Tubular adenoma of colon 12/2009   Type 2 diabetes mellitus (HCC)    Wears glasses    Past Surgical History:  Past Surgical History:  Procedure Laterality Date   CARDIAC CATHETERIZATION  03-03-2000  dr Sharlot Gowda taylor   normal lvsf/  two-vessel cad significant complex stenosis at distal left main   CARDIOVASCULAR STRESS TEST  08-09-2010  DR GREGG TAYLOR   normal lexiscan nuclear study/  no ischemia/  normal lvf/  ef 54%   CATARACT EXTRACTION W/ INTRAOCULAR LENS IMPLANT Right 03/07/2009   COLONOSCOPY  10/09/2020   12/30/2009   CORONARY ARTERY BYPASS GRAFT  03-06-2000   DR GERHARDT   third vessel   PENILE PROSTHESIS IMPLANT  02/08/2010   COLOPLAST 3-PIECE INFLATABLE   PENILE PROSTHESIS IMPLANT N/A 05/27/2013   Procedure: CYSTO REMOVAL OF PENILE PROSTHESIS;  Surgeon: Garnett Farm, MD;  Location:  [facility];  Service: Urology;  Laterality: N/A;   POLYPECTOMY  12/30/2009   +TA   TOTAL HIP ARTHROPLASTY  01/24/2011   Procedure: TOTAL HIP ARTHROPLASTY;  Surgeon: Gus Rankin [name];  Location: [facility];  Service: Orthopedics;  Laterality: Left;   TOTAL HIP ARTHROPLASTY Right 01/30/2013   Procedure: RIGHT TOTAL HIP ARTHROPLASTY;  Surgeon: Loanne Drilling, MD;  Location: [facility];  Service: Orthopedics;  Laterality: Right;   Social History:  reports that he quit smoking about 52 years ago. His smoking use included cigarettes. He has never used smokeless tobacco. He reports that he does not drink  alcohol and does not use drugs. Family History:  Family History  Problem Relation Age of Onset   Hypertension Mother    Diabetes Father    Cancer Sister 27       ovarian ca   Cancer - Other Brother        cancer all over   Coronary artery disease Other        1st degree male relative   Hypertension Other    Colon cancer Neg Hx    Stomach cancer Neg Hx    Esophageal cancer Neg Hx    Pancreatic cancer Neg Hx    Liver disease Neg Hx    Colon polyps Neg Hx    Rectal cancer Neg Hx      HOME MEDICATIONS: Allergies as of 11/14/2022       Reactions   Enalapril    SOB   Hydrocodone-acetaminophen Nausea Only   Can take codein        Medication List        Accurate as of November 14, 2022  3:05 PM. If you have any questions, ask your nurse or doctor.          Accu-Chek Aviva Soln Use as directed as needed.   Accu-Chek Guide Me w/Device Kit Use as instructed to check blood sugars 1 times a day, fasting   accu-chek softclix lancets Use twice daily as instructed. Dx: 250.00   albuterol 108 (90 Base) MCG/ACT inhaler Commonly known as: Ventolin HFA INHALE 2 PUFFS INTO THE LUNGS EVERY 4 HOURS AS NEEDED FOR WHEEZING OR SHORTNESS OF BREATH   allopurinol 100 MG tablet Commonly known as: ZYLOPRIM TAKE 1 TABLET EVERY DAY   aspirin EC 81 MG tablet Take 2 tablets (162 mg total) by mouth daily. Swallow whole.   cholecalciferol 1000 units tablet Commonly known as: VITAMIN D Take 1,000 Units by mouth every morning.   colchicine 0.6 MG tablet Take 1 tablet (0.6 mg total) by mouth daily.   DropSafe Alcohol Prep 70 % Pads USE AS DIRECTED   empagliflozin 25 MG Tabs tablet Commonly known as: Jardiance Take 1 tablet (25 mg total) by mouth daily before breakfast.   finasteride 5 MG tablet Commonly known as: PROSCAR TAKE 1 TABLET (5 MG TOTAL) EVERY MORNING.   fluticasone furoate-vilanterol 100-25 MCG/INH Aepb Commonly known as: Breo Ellipta Inhale 1 puff into the lungs  daily.   glipiZIDE 5 MG tablet Commonly known as: GLUCOTROL Take 1 tablet (5 mg total) by mouth 2 (two) times daily before a meal.   irbesartan-hydrochlorothiazide 300-12.5 MG tablet Commonly known as: AVALIDE TAKE 1 TABLET EVERY DAY   lovastatin 40 MG tablet Commonly known as: MEVACOR TAKE 1 TABLET EVERY MORNING   MULTI-VITAMIN GUMMIES PO Take 1 tablet by mouth daily.   nitroGLYCERIN 0.4 MG SL tablet Commonly known as: NITROSTAT Place 1  tablet (0.4 mg total) under the tongue every 5 (five) minutes as needed for chest pain.   OneTouch Delica Plus Lancet33G Misc USE TWICE DAILY AS DIRECTED   Accu-Chek Softclix Lancets lancets Use as instructed to check blood sugars 1 times a day, fasting   OneTouch Ultra test strip Generic drug: glucose blood USE TWICE DAILY AS  INSTRUCTED   Accu-Chek Guide test strip Generic drug: glucose blood Use as instructed, check blood sugars 1 times a day, fasting   pantoprazole 40 MG tablet Commonly known as: PROTONIX TAKE 1 TABLET EVERY MORNING   sildenafil 100 MG tablet Commonly known as: Viagra Take 0.5-1 tablets (50-100 mg total) by mouth daily as needed for erectile dysfunction.   tamsulosin 0.4 MG Caps capsule Commonly known as: FLOMAX TAKE 1 CAPSULE (0.4 MG TOTAL) DAILY.         OBJECTIVE:   Vital Signs: BP 120/80 (BP Location: Left Arm, Patient Position: Sitting, Cuff Size: Small)   Pulse 66   Ht 5\' 9"  (1.753 m)   Wt 221 lb (100.2 kg)   SpO2 99%   BMI 32.64 kg/m   Wt Readings from Last 3 Encounters:  11/14/22 221 lb (100.2 kg)  09/06/22 220 lb (99.8 kg)  08/04/22 220 lb (99.8 kg)     Exam: General: Pt appears well and is in NAD  Lungs: Clear with good BS bilat  Heart: RRR   Extremities: No pretibial edema   Neuro: MS is good with appropriate affect, pt is alert and Ox3    DM foot exam: 08/04/2022  The skin of the feet is intact without sores or ulcerations. The pedal pulses are 1+  The sensation is intact  to a screening 5.07, 10 gram monofilament bilaterally     DATA REVIEWED:  Lab Results  Component Value Date   HGBA1C 7.6 (H) 09/06/2022   HGBA1C 7.9 (H) 06/07/2022   HGBA1C 9.0 (H) 03/08/2022    Latest Reference Range & Units 09/06/22 14:49  Sodium 135 - 145 mEq/L 135  Potassium 3.5 - 5.1 mEq/L 4.4  Chloride 96 - 112 mEq/L 102  CO2 19 - 32 mEq/L 26  Glucose 70 - 99 mg/dL 409 (H)  BUN 6 - 23 mg/dL 32 (H)  Creatinine 8.11 - 1.50 mg/dL 9.14 (H)  Calcium 8.4 - 10.5 mg/dL 9.4  Alkaline Phosphatase 39 - 117 U/L 77  Albumin 3.5 - 5.2 g/dL 3.9  AST 0 - 37 U/L 14  ALT 0 - 53 U/L 13  Total Protein 6.0 - 8.3 g/dL 7.1  Total Bilirubin 0.2 - 1.2 mg/dL 0.4  GFR >78.29 mL/min 31.90 (L)  (H): Data is abnormally high (L): Data is abnormally low   ASSESSMENT / PLAN / RECOMMENDATIONS:   1) Type 2 Diabetes Mellitus, poorly controlled, With neuropathic, CKD III  and macrovascular  complications - Most recent A1c of 8.1%. Goal A1c < 7.0 %.    -His A1c continues to trend upwards, patient admits to dietary indiscretions -We had to stop metformin due to a GFR <35 -He used to be on Rybelsus without any side effects but this became cost prohibitive.  Today he was provided with a #30 of Rybelsus 3 mg sample for 30 days and patient assistance forms were provided.  Patient not interested in injectables -If he is not approved for Rybelsus will increase glipizide -We again emphasized the importance of low-carb diet and optimizing glucose control to decrease any further renal damage   MEDICATIONS: Start Rybelsus 3 mg daily  for 30 days, then increase to 7 mg daily Continue Jardiance 25 mg daily Continue glipizide 5 mg BID    EDUCATION / INSTRUCTIONS: BG monitoring instructions: Patient is instructed to check his blood sugars 1 times a day, fasting. Call [facility] if: BG persistently < 70  I reviewed the Rule of 15 for the treatment of hypoglycemia in detail with the patient.  Literature supplied.   2) Diabetic complications:  Eye: Does not have known diabetic retinopathy.  Neuro/ Feet: Does  have known diabetic peripheral neuropathy .  Renal: Patient does have known baseline CKD. He   is  on an ACEI/ARB at present.    3)CKD III:  -GFR has been fluctuating -Patient on ARB -Emphasized importance of glycemic control and preventing further renal damage -A referral to nephrology has been placed  F/U in 4 months   Signed electronically by: Lyndle Herrlich, MD  [facility] Endocrinology  [facility] [address] [place], [place] [address] Phone: [phone] FAX: [phone]   CC: Tresa Garter, MD [address] [place] [address] Phone: [phone]  Fax: [phone]  Return to Endocrinology clinic as below: Future Appointments  Date Time Provider Department Center  12/08/2022  2:20 PM Plotnikov, Georgina Quint, MD [facility] None  05/16/2023  3:30 PM [facility]-ANNUAL WELLNESS VISIT [facility] None

## 2022-11-14 NOTE — Patient Instructions (Addendum)
Start Rybelsus 3 mg, 1 tablet before breakfast, than increase to 7 mg daily  Glipizide 5 mg , 1 tablet before Breakfast and 1 tablet before Supper Jardiance 25 mg , 1 tablet before Breakfast      HOW TO TREAT LOW BLOOD SUGARS (Blood sugar LESS THAN 70 MG/DL) Please follow the RULE OF 15 for the treatment of hypoglycemia treatment (when your (blood sugars are less than 70 mg/dL)   STEP 1: Take 15 grams of carbohydrates when your blood sugar is low, which includes:  3-4 GLUCOSE TABS  OR 3-4 OZ OF JUICE OR REGULAR SODA OR ONE TUBE OF GLUCOSE GEL    STEP 2: RECHECK blood sugar in 15 MINUTES STEP 3: If your blood sugar is still low at the 15 minute recheck --> then, go back to STEP 1 and treat AGAIN with another 15 grams of carbohydrates.

## 2022-12-08 ENCOUNTER — Encounter: Payer: Self-pay | Admitting: Internal Medicine

## 2022-12-08 ENCOUNTER — Ambulatory Visit: Payer: Medicare HMO | Admitting: Internal Medicine

## 2022-12-08 VITALS — BP 118/60 | HR 63 | Temp 98.6°F | Ht 69.0 in | Wt 217.0 lb

## 2022-12-08 DIAGNOSIS — E1159 Type 2 diabetes mellitus with other circulatory complications: Secondary | ICD-10-CM

## 2022-12-08 DIAGNOSIS — Z23 Encounter for immunization: Secondary | ICD-10-CM

## 2022-12-08 DIAGNOSIS — M79662 Pain in left lower leg: Secondary | ICD-10-CM | POA: Diagnosis not present

## 2022-12-08 DIAGNOSIS — I10 Essential (primary) hypertension: Secondary | ICD-10-CM

## 2022-12-08 DIAGNOSIS — Z794 Long term (current) use of insulin: Secondary | ICD-10-CM

## 2022-12-08 DIAGNOSIS — M79661 Pain in right lower leg: Secondary | ICD-10-CM | POA: Diagnosis not present

## 2022-12-08 LAB — COMPREHENSIVE METABOLIC PANEL
ALT: 11 U/L (ref 0–53)
AST: 15 U/L (ref 0–37)
Albumin: 4 g/dL (ref 3.5–5.2)
Alkaline Phosphatase: 78 U/L (ref 39–117)
BUN: 22 mg/dL (ref 6–23)
CO2: 28 meq/L (ref 19–32)
Calcium: 9.3 mg/dL (ref 8.4–10.5)
Chloride: 104 meq/L (ref 96–112)
Creatinine, Ser: 2.26 mg/dL — ABNORMAL HIGH (ref 0.40–1.50)
GFR: 27.17 mL/min — ABNORMAL LOW (ref >60.00)
Glucose, Bld: 62 mg/dL — ABNORMAL LOW (ref 70–99)
Potassium: 4.6 meq/L (ref 3.5–5.1)
Sodium: 136 meq/L (ref 135–145)
Total Bilirubin: 0.5 mg/dL (ref 0.2–1.2)
Total Protein: 7.2 g/dL (ref 6.0–8.3)

## 2022-12-08 MED ORDER — DAPAGLIFLOZIN PROPANEDIOL 10 MG PO TABS: 10.0000 mg | ORAL_TABLET | Freq: Every day | ORAL | 5 refills | Status: DC

## 2022-12-08 NOTE — Progress Notes (Signed)
CREATININE 2.26 (H) 12/08/2022   BUN 22 12/08/2022   CO2 28 12/08/2022   TSH 6.61 (H) 03/08/2022   PSA 1.51 12/01/2021   INR 1.15 01/21/2013   HGBA1C 7.7 (H) 12/08/2022   MICROALBUR <0.7 06/07/2022    VAS [place] LOWER EXT ART SEG MULTI (SEGMENTALS & LE RAYNAUDS)  Result Date: 09/13/2022  LOWER EXTREMITY DOPPLER STUDY Patient Name:  Johnny [name]  Date of Exam:   09/12/2022 Medical Rec #: [id number]        Accession #:    [id number] Date of Birth: [birth year]        Patient Gender: M Patient Age:   1 years Exam Location:  [facility] Procedure:      VAS [place] ABI WITH/WO TBI Referring Phys: Macarthur Critchley Tereza Gilham --------------------------------------------------------------------------------  Indications: Claudication. High Risk         Hypertension, hyperlipidemia, Diabetes, coronary artery Factors:          disease.  Comparison Study: no prior Performing Technologist: [place] Ponder RVS  Examination Guidelines: A complete evaluation includes at minimum, Doppler waveform signals and systolic blood pressure reading at the level of bilateral brachial, anterior tibial, and posterior tibial arteries, when vessel segments are accessible. Bilateral testing is considered an integral part of a complete examination. Photoelectric Plethysmograph (PPG) waveforms and toe systolic pressure readings are included as required and additional duplex testing as needed. Limited examinations for reoccurring indications may be performed as noted.  ABI Findings: +---------+------------------+-----+----------+--------+ Right    Rt Pressure (mmHg)IndexWaveform  Comment   +---------+------------------+-----+----------+--------+ Brachial 162                                       +---------+------------------+-----+----------+--------+ PTA      95                0.59 monophasic         +---------+------------------+-----+----------+--------+ DP       91                0.56 monophasic         +---------+------------------+-----+----------+--------+ Great Toe40                0.25 Abnormal           +---------+------------------+-----+----------+--------+ +---------+------------------+-----+----------+-------+ Left     Lt Pressure (mmHg)IndexWaveform  Comment +---------+------------------+-----+----------+-------+ Brachial 151                                      +---------+------------------+-----+----------+-------+ PTA      68                0.42 monophasic        +---------+------------------+-----+----------+-------+ DP       145               0.90 monophasic        +---------+------------------+-----+----------+-------+ Great Toe43                0.27 Abnormal          +---------+------------------+-----+----------+-------+ +-------+-----------+-----------+------------+------------+ ABI/TBIToday's ABIToday's TBIPrevious ABIPrevious TBI +-------+-----------+-----------+------------+------------+ Right  0.59       0.25                                +-------+-----------+-----------+------------+------------+ Left  Subjective:  Patient ID: Johnny [name], male    DOB: 07-30-1945  Age: 77 y.o. MRN: [id number]  CC: Follow-up (3 [facility] F/U)   HPI Johnny [name] presents for achy legs/calves, DM, HTN, PAD  Outpatient Medications Prior to Visit  Medication Sig Dispense Refill   Accu-Chek Softclix Lancets lancets Use as instructed to check blood sugars 1 times a day, fasting 100 each 12   albuterol (VENTOLIN HFA) 108 (90 Base) MCG/ACT inhaler INHALE 2 PUFFS INTO THE LUNGS EVERY 4 HOURS AS NEEDED FOR WHEEZING OR SHORTNESS OF BREATH 18 g 5   Alcohol Swabs (DROPSAFE ALCOHOL PREP) 70 % PADS USE AS DIRECTED 100 each 3   allopurinol (ZYLOPRIM) 100 MG tablet TAKE 1 TABLET EVERY DAY 90 tablet 3   aspirin 81 MG EC tablet Take 2 tablets (162 mg total) by mouth daily. Swallow whole. 100 tablet 3   Blood Glucose Calibration (ACCU-CHEK AVIVA) SOLN Use as directed as needed. 1 each 2   Blood Glucose Monitoring Suppl (ACCU-CHEK GUIDE ME) w/Device KIT Use as instructed to check blood sugars 1 times a day, fasting 1 kit 0   cholecalciferol (VITAMIN D) 1000 UNITS tablet Take 1,000 Units by mouth every morning.     colchicine 0.6 MG tablet Take 1 tablet (0.6 mg total) by mouth daily. 90 tablet 3   empagliflozin (JARDIANCE) 25 MG TABS tablet Take 1 tablet (25 mg total) by mouth daily before breakfast. 90 tablet 3   finasteride (PROSCAR) 5 MG tablet TAKE 1 TABLET (5 MG TOTAL) EVERY MORNING. 90 tablet 3   fluticasone furoate-vilanterol (BREO ELLIPTA) 100-25 MCG/INH AEPB Inhale 1 puff into the lungs daily. 1 each 5   glipiZIDE (GLUCOTROL) 5 MG tablet Take 1 tablet (5 mg total) by mouth 2 (two) times daily before a meal. 90 tablet 0   glucose blood (ACCU-CHEK GUIDE) test strip Use as instructed, check blood sugars 1 times a day, fasting 100 each 12   irbesartan-hydrochlorothiazide (AVALIDE) 300-12.5 MG tablet TAKE 1 TABLET EVERY DAY 90 tablet 3   Lancet Devices (ACCU-CHEK SOFTCLIX) lancets Use twice daily as instructed. Dx:  250.00 1 each 3   Lancets (ONETOUCH DELICA PLUS LANCET33G) MISC USE TWICE DAILY AS DIRECTED 200 each 3   lovastatin (MEVACOR) 40 MG tablet TAKE 1 TABLET EVERY MORNING 90 tablet 3   Multiple Vitamins-Minerals (MULTI-VITAMIN GUMMIES PO) Take 1 tablet by mouth daily.     nitroGLYCERIN (NITROSTAT) 0.4 MG SL tablet Place 1 tablet (0.4 mg total) under the tongue every 5 (five) minutes as needed for chest pain. 25 tablet 3   ONETOUCH ULTRA test strip USE TWICE DAILY AS  INSTRUCTED 200 strip 3   pantoprazole (PROTONIX) 40 MG tablet TAKE 1 TABLET EVERY MORNING 90 tablet 3   Semaglutide (RYBELSUS) 7 MG TABS Take 1 tablet (7 mg total) by mouth daily.     sildenafil (VIAGRA) 100 MG tablet Take 0.5-1 tablets (50-100 mg total) by mouth daily as needed for erectile dysfunction. 12 tablet 11   tamsulosin (FLOMAX) 0.4 MG CAPS capsule TAKE 1 CAPSULE (0.4 MG TOTAL) DAILY. 90 capsule 3   No facility-administered medications prior to visit.    ROS: Review of Systems  Constitutional:  Negative for appetite change, fatigue and unexpected weight change.  HENT:  Negative for congestion, nosebleeds, sneezing, sore throat and trouble swallowing.   Eyes:  Negative for itching and visual disturbance.  Respiratory:  Negative for cough.   Cardiovascular:  Negative for chest pain, palpitations and  CREATININE 2.26 (H) 12/08/2022   BUN 22 12/08/2022   CO2 28 12/08/2022   TSH 6.61 (H) 03/08/2022   PSA 1.51 12/01/2021   INR 1.15 01/21/2013   HGBA1C 7.7 (H) 12/08/2022   MICROALBUR <0.7 06/07/2022    VAS [place] LOWER EXT ART SEG MULTI (SEGMENTALS & LE RAYNAUDS)  Result Date: 09/13/2022  LOWER EXTREMITY DOPPLER STUDY Patient Name:  Johnny [name]  Date of Exam:   09/12/2022 Medical Rec #: [id number]        Accession #:    [id number] Date of Birth: [birth year]        Patient Gender: M Patient Age:   1 years Exam Location:  [facility] Procedure:      VAS [place] ABI WITH/WO TBI Referring Phys: Macarthur Critchley Tereza Gilham --------------------------------------------------------------------------------  Indications: Claudication. High Risk         Hypertension, hyperlipidemia, Diabetes, coronary artery Factors:          disease.  Comparison Study: no prior Performing Technologist: [place] Ponder RVS  Examination Guidelines: A complete evaluation includes at minimum, Doppler waveform signals and systolic blood pressure reading at the level of bilateral brachial, anterior tibial, and posterior tibial arteries, when vessel segments are accessible. Bilateral testing is considered an integral part of a complete examination. Photoelectric Plethysmograph (PPG) waveforms and toe systolic pressure readings are included as required and additional duplex testing as needed. Limited examinations for reoccurring indications may be performed as noted.  ABI Findings: +---------+------------------+-----+----------+--------+ Right    Rt Pressure (mmHg)IndexWaveform  Comment   +---------+------------------+-----+----------+--------+ Brachial 162                                       +---------+------------------+-----+----------+--------+ PTA      95                0.59 monophasic         +---------+------------------+-----+----------+--------+ DP       91                0.56 monophasic         +---------+------------------+-----+----------+--------+ Great Toe40                0.25 Abnormal           +---------+------------------+-----+----------+--------+ +---------+------------------+-----+----------+-------+ Left     Lt Pressure (mmHg)IndexWaveform  Comment +---------+------------------+-----+----------+-------+ Brachial 151                                      +---------+------------------+-----+----------+-------+ PTA      68                0.42 monophasic        +---------+------------------+-----+----------+-------+ DP       145               0.90 monophasic        +---------+------------------+-----+----------+-------+ Great Toe43                0.27 Abnormal          +---------+------------------+-----+----------+-------+ +-------+-----------+-----------+------------+------------+ ABI/TBIToday's ABIToday's TBIPrevious ABIPrevious TBI +-------+-----------+-----------+------------+------------+ Right  0.59       0.25                                +-------+-----------+-----------+------------+------------+ Left  CREATININE 2.26 (H) 12/08/2022   BUN 22 12/08/2022   CO2 28 12/08/2022   TSH 6.61 (H) 03/08/2022   PSA 1.51 12/01/2021   INR 1.15 01/21/2013   HGBA1C 7.7 (H) 12/08/2022   MICROALBUR <0.7 06/07/2022    VAS [place] LOWER EXT ART SEG MULTI (SEGMENTALS & LE RAYNAUDS)  Result Date: 09/13/2022  LOWER EXTREMITY DOPPLER STUDY Patient Name:  Johnny [name]  Date of Exam:   09/12/2022 Medical Rec #: [id number]        Accession #:    [id number] Date of Birth: [birth year]        Patient Gender: M Patient Age:   1 years Exam Location:  [facility] Procedure:      VAS [place] ABI WITH/WO TBI Referring Phys: Macarthur Critchley Tereza Gilham --------------------------------------------------------------------------------  Indications: Claudication. High Risk         Hypertension, hyperlipidemia, Diabetes, coronary artery Factors:          disease.  Comparison Study: no prior Performing Technologist: [place] Ponder RVS  Examination Guidelines: A complete evaluation includes at minimum, Doppler waveform signals and systolic blood pressure reading at the level of bilateral brachial, anterior tibial, and posterior tibial arteries, when vessel segments are accessible. Bilateral testing is considered an integral part of a complete examination. Photoelectric Plethysmograph (PPG) waveforms and toe systolic pressure readings are included as required and additional duplex testing as needed. Limited examinations for reoccurring indications may be performed as noted.  ABI Findings: +---------+------------------+-----+----------+--------+ Right    Rt Pressure (mmHg)IndexWaveform  Comment   +---------+------------------+-----+----------+--------+ Brachial 162                                       +---------+------------------+-----+----------+--------+ PTA      95                0.59 monophasic         +---------+------------------+-----+----------+--------+ DP       91                0.56 monophasic         +---------+------------------+-----+----------+--------+ Great Toe40                0.25 Abnormal           +---------+------------------+-----+----------+--------+ +---------+------------------+-----+----------+-------+ Left     Lt Pressure (mmHg)IndexWaveform  Comment +---------+------------------+-----+----------+-------+ Brachial 151                                      +---------+------------------+-----+----------+-------+ PTA      68                0.42 monophasic        +---------+------------------+-----+----------+-------+ DP       145               0.90 monophasic        +---------+------------------+-----+----------+-------+ Great Toe43                0.27 Abnormal          +---------+------------------+-----+----------+-------+ +-------+-----------+-----------+------------+------------+ ABI/TBIToday's ABIToday's TBIPrevious ABIPrevious TBI +-------+-----------+-----------+------------+------------+ Right  0.59       0.25                                +-------+-----------+-----------+------------+------------+ Left

## 2022-12-08 NOTE — Patient Instructions (Signed)
Dear Johnny [name], Your vascular ultrasound test reveals that you have a moderate blockage in circulation of your right leg/foot and a mild blockage on the left. 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  If the pain in the leg stops you, take a short break and the pain should go away. Let me know if you you are ready to see a vascular surgeon for consultation. Sincerely, AP

## 2022-12-08 NOTE — Assessment & Plan Note (Signed)
The vascular ultrasound test reveals that you have a moderate blockage in circulation of your right leg/foot and a mild blockage on the left. 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  If the pain in the leg stops you, take a short break and the pain should go away. Let me know if you you are ready to see a vascular surgeon for consultation.

## 2022-12-09 LAB — HEMOGLOBIN A1C: Hgb A1c MFr Bld: 7.7 % — ABNORMAL HIGH (ref 4.6–6.5)

## 2022-12-20 ENCOUNTER — Encounter: Payer: Self-pay | Admitting: Internal Medicine

## 2022-12-20 NOTE — Progress Notes (Signed)
Patient ID: Johnny [name], male   DOB: 30-Jul-1945, 77 y.o.   MRN: [id number]  Reason for Consult: New Patient (Initial Visit)   Referred by Plotnikov, Georgina Quint, MD  Subjective:     HPI  Johnny [name] is a 77 y.o. male with CAD, CHF, HTN and type 2 DM presenting for claudication.  He works as a Furniture conservator/restorer.  He reports bilateral calf claudication after a few blocks.  He does report that this is impacting his exercise class as he has to sit down intermittently and have someone else leave the class for some time.  He reports that both legs are similarly affected.  He denies any history of rest pain or tissue loss.  He does have CAD and had a CABG in 1999.  He denies chest pain, shortness of breath or chest pressure with exertion.  His cardiology notes from 2020 and 2021 were reviewed.  No report of an ejection fraction or echo. He is a former smoker and quit in the 70s.  He is compliant with aspirin and statin.  Past Medical History:  Diagnosis Date   Arthritis    At risk for sleep apnea    STOP-BANG=  6   SENT TO PCP 05-21-2013   Benign positional vertigo    Benign prostatic hypertrophy    Blood transfusion without reported diagnosis 2000   CAD (coronary artery disease)    CARDIOLOGIST--  DR Lewayne Bunting   CHF (congestive heart failure) (HCC)    Chronic ischemic heart disease, unspecified    ED (erectile dysfunction) of organic origin    GERD (gastroesophageal reflux disease)    History of CHF (congestive heart failure)    systolic   HTN (hypertension)    Hyperlipidemia    Tubular adenoma of colon 12/2009   Type 2 diabetes mellitus (HCC)    Wears glasses    Family History  Problem Relation Age of Onset   Hypertension Mother    Diabetes Father    Cancer Sister 77       ovarian ca   Cancer - Other Brother        cancer all over   Coronary artery disease Other        1st degree male relative   Hypertension Other    Colon cancer Neg Hx    Stomach  cancer Neg Hx    Esophageal cancer Neg Hx    Pancreatic cancer Neg Hx    Liver disease Neg Hx    Colon polyps Neg Hx    Rectal cancer Neg Hx    Past Surgical History:  Procedure Laterality Date   CARDIAC CATHETERIZATION  03-03-2000  dr Sharlot Gowda taylor   normal lvsf/  two-vessel cad significant complex stenosis at distal left main   CARDIOVASCULAR STRESS TEST  08-09-2010  DR GREGG TAYLOR   normal lexiscan nuclear study/  no ischemia/  normal lvf/  ef 54%   CATARACT EXTRACTION W/ INTRAOCULAR LENS IMPLANT Right 03/07/2009   COLONOSCOPY  10/09/2020   12/30/2009   CORONARY ARTERY BYPASS GRAFT  03-06-2000   DR GERHARDT   third vessel   PENILE PROSTHESIS IMPLANT  02/08/2010   COLOPLAST 3-PIECE INFLATABLE   PENILE PROSTHESIS IMPLANT N/A 05/27/2013   Procedure: CYSTO REMOVAL OF PENILE PROSTHESIS;  Surgeon: Garnett Farm, MD;  Location: [facility];  Service: Urology;  Laterality: N/A;   POLYPECTOMY  12/30/2009   +TA   TOTAL HIP ARTHROPLASTY  01/24/2011  Procedure: TOTAL HIP ARTHROPLASTY;  Surgeon: Loanne Drilling;  Location: [facility];  Service: Orthopedics;  Laterality: Left;   TOTAL HIP ARTHROPLASTY Right 01/30/2013   Procedure: RIGHT TOTAL HIP ARTHROPLASTY;  Surgeon: Loanne Drilling, MD;  Location: [facility];  Service: Orthopedics;  Laterality: Right;    Short Social History:  Social History   Tobacco Use   Smoking status: Former    Current packs/day: 0.00    Types: Cigarettes    Quit date: 05/22/1970    Years since quitting: 52.6   Smokeless tobacco: Never  Substance Use Topics   Alcohol use: No    Comment: seldom    Allergies  Allergen Reactions   Enalapril     SOB   Hydrocodone-Acetaminophen Nausea Only    Can take codein    Current Outpatient Medications  Medication Sig Dispense Refill   Accu-Chek Softclix Lancets lancets Use as instructed to check blood sugars 1 times a day, fasting 100 each 12   albuterol (VENTOLIN HFA) 108 (90 Base) MCG/ACT inhaler  INHALE 2 PUFFS INTO THE LUNGS EVERY 4 HOURS AS NEEDED FOR WHEEZING OR SHORTNESS OF BREATH 18 g 5   Alcohol Swabs (DROPSAFE ALCOHOL PREP) 70 % PADS USE AS DIRECTED 100 each 3   allopurinol (ZYLOPRIM) 100 MG tablet TAKE 1 TABLET EVERY DAY 90 tablet 3   aspirin 81 MG EC tablet Take 2 tablets (162 mg total) by mouth daily. Swallow whole. 100 tablet 3   Blood Glucose Calibration (ACCU-CHEK AVIVA) SOLN Use as directed as needed. 1 each 2   Blood Glucose Monitoring Suppl (ACCU-CHEK GUIDE ME) w/Device KIT Use as instructed to check blood sugars 1 times a day, fasting 1 kit 0   cholecalciferol (VITAMIN D) 1000 UNITS tablet Take 1,000 Units by mouth every morning.     colchicine 0.6 MG tablet Take 1 tablet (0.6 mg total) by mouth daily. 90 tablet 3   dapagliflozin propanediol (FARXIGA) 10 MG TABS tablet Take 1 tablet (10 mg total) by mouth daily before breakfast. 30 tablet 5   empagliflozin (JARDIANCE) 25 MG TABS tablet Take 1 tablet (25 mg total) by mouth daily before breakfast. 90 tablet 3   finasteride (PROSCAR) 5 MG tablet TAKE 1 TABLET (5 MG TOTAL) EVERY MORNING. 90 tablet 3   fluticasone furoate-vilanterol (BREO ELLIPTA) 100-25 MCG/INH AEPB Inhale 1 puff into the lungs daily. 1 each 5   glipiZIDE (GLUCOTROL) 5 MG tablet Take 1 tablet (5 mg total) by mouth 2 (two) times daily before a meal. 90 tablet 0   glucose blood (ACCU-CHEK GUIDE) test strip Use as instructed, check blood sugars 1 times a day, fasting 100 each 12   irbesartan-hydrochlorothiazide (AVALIDE) 300-12.5 MG tablet TAKE 1 TABLET EVERY DAY 90 tablet 3   Lancet Devices (ACCU-CHEK SOFTCLIX) lancets Use twice daily as instructed. Dx: 250.00 1 each 3   Lancets (ONETOUCH DELICA PLUS LANCET33G) MISC USE TWICE DAILY AS DIRECTED 200 each 3   lovastatin (MEVACOR) 40 MG tablet TAKE 1 TABLET EVERY MORNING 90 tablet 3   Multiple Vitamins-Minerals (MULTI-VITAMIN GUMMIES PO) Take 1 tablet by mouth daily.     nitroGLYCERIN (NITROSTAT) 0.4 MG SL tablet  Place 1 tablet (0.4 mg total) under the tongue every 5 (five) minutes as needed for chest pain. 25 tablet 3   ONETOUCH ULTRA test strip USE TWICE DAILY AS  INSTRUCTED 200 strip 3   pantoprazole (PROTONIX) 40 MG tablet TAKE 1 TABLET EVERY MORNING 90 tablet 3   Semaglutide (RYBELSUS) 7  MG TABS Take 1 tablet (7 mg total) by mouth daily.     sildenafil (VIAGRA) 100 MG tablet Take 0.5-1 tablets (50-100 mg total) by mouth daily as needed for erectile dysfunction. 12 tablet 11   tamsulosin (FLOMAX) 0.4 MG CAPS capsule TAKE 1 CAPSULE (0.4 MG TOTAL) DAILY. 90 capsule 3   No current facility-administered medications for this visit.    REVIEW OF SYSTEMS  Negative other than noted in HPI     Objective:  Objective   Vitals:   12/23/22 0858  BP: 115/70  Pulse: 74  Temp: 98.9 F (37.2 C)  TempSrc: Temporal  SpO2: 97%  Weight: 220 lb 4.8 oz (99.9 kg)  Height: 5\' 9"  (1.753 m)   Body mass index is 32.53 kg/m.  Physical Exam General: no acute distress Cardiac: hemodynamically stable, nontachycardic Pulm: normal work of breathing Neuro: alert, no focal deficit Extremities: no edema, cyanosis or wounds Vascular:   Right: Palpable femoral, nonpalpable pedal's  Left: Palpable femoral, DP   Data: ABI from 09/12/2022 ABI Findings:  +---------+------------------+-----+----------+--------+  Right   Rt Pressure (mmHg)IndexWaveform  Comment   +---------+------------------+-----+----------+--------+  Brachial 162                                        +---------+------------------+-----+----------+--------+  PTA     95                0.59 monophasic          +---------+------------------+-----+----------+--------+  DP      91                0.56 monophasic          +---------+------------------+-----+----------+--------+  Great Toe40                0.25 Abnormal            +---------+------------------+-----+----------+--------+    +---------+------------------+-----+----------+-------+  Left    Lt Pressure (mmHg)IndexWaveform  Comment  +---------+------------------+-----+----------+-------+  Brachial 151                                       +---------+------------------+-----+----------+-------+  PTA     68                0.42 monophasic         +---------+------------------+-----+----------+-------+  DP      145               0.90 monophasic         +---------+------------------+-----+----------+-------+  Great Toe43                0.27 Abnormal           +---------+------------------+-----+----------+-------+      Assessment/Plan:     Johnny [name] is a 77 y.o. male with multiple medical comorbidities presenting for PAD with claudication.  We discussed the natural history of PAD and given that he does not have rest pain or tissue loss he is at a low risk of future limb loss although he does report this is impacting his life.  He is currently on best medical management with aspirin, statin and abstinence from tobacco.  We reviewed the risks and benefits of angiogram and I explained that my recommendation would be to continue with best medical therapy and exercise at this  time.  We discussed the details of a walking program.   He and his wife were interested if there was a medication to assist.  We discussed Pletal but I explained that it is contraindicated in patients with a diagnosis of heart failure.  There is no recent echo in our system. He has not seen his cardiologist (Dr. Ladona Ridgel) in some time.   Will refer for follow-up to assess the possibility of a Pletal prescription  Plan to follow-up in 3 months to reevaluate his symptoms    Recommendations to optimize cardiovascular risk: Abstinence from all tobacco products. Blood glucose control with goal A1c < 7%. Blood pressure control with goal blood pressure < 140/90 mmHg. Lipid reduction therapy with goal LDL-C <100 mg/dL   Aspirin 81mg  PO QD.  Atorvastatin 40-80mg  PO QD (or other "high intensity" statin therapy).     Daria Pastures MD [facility]

## 2022-12-20 NOTE — Assessment & Plan Note (Signed)
Chronic Cont on Irbesartan HCT On Jardiance

## 2022-12-23 ENCOUNTER — Ambulatory Visit: Payer: Medicare HMO | Admitting: Vascular Surgery

## 2022-12-23 VITALS — BP 115/70 | HR 74 | Temp 98.9°F | Ht 69.0 in | Wt 220.3 lb

## 2022-12-23 DIAGNOSIS — I5030 Unspecified diastolic (congestive) heart failure: Secondary | ICD-10-CM | POA: Diagnosis not present

## 2022-12-23 DIAGNOSIS — I2510 Atherosclerotic heart disease of native coronary artery without angina pectoris: Secondary | ICD-10-CM | POA: Diagnosis not present

## 2022-12-23 DIAGNOSIS — I739 Peripheral vascular disease, unspecified: Secondary | ICD-10-CM

## 2022-12-23 DIAGNOSIS — I10 Essential (primary) hypertension: Secondary | ICD-10-CM

## 2022-12-23 DIAGNOSIS — E119 Type 2 diabetes mellitus without complications: Secondary | ICD-10-CM

## 2022-12-26 ENCOUNTER — Other Ambulatory Visit: Payer: Self-pay | Admitting: Internal Medicine

## 2023-01-11 ENCOUNTER — Ambulatory Visit: Payer: Medicare HMO | Admitting: Internal Medicine

## 2023-01-11 ENCOUNTER — Encounter: Payer: Self-pay | Admitting: Cardiovascular Disease

## 2023-01-11 ENCOUNTER — Ambulatory Visit: Payer: Medicare HMO | Attending: Cardiovascular Disease | Admitting: Cardiovascular Disease

## 2023-01-11 VITALS — BP 130/60 | HR 72 | Ht 71.0 in | Wt 221.0 lb

## 2023-01-11 DIAGNOSIS — M79605 Pain in left leg: Secondary | ICD-10-CM | POA: Diagnosis not present

## 2023-01-11 DIAGNOSIS — M79604 Pain in right leg: Secondary | ICD-10-CM | POA: Diagnosis not present

## 2023-01-11 DIAGNOSIS — I739 Peripheral vascular disease, unspecified: Secondary | ICD-10-CM | POA: Diagnosis not present

## 2023-01-11 DIAGNOSIS — R0989 Other specified symptoms and signs involving the circulatory and respiratory systems: Secondary | ICD-10-CM | POA: Diagnosis not present

## 2023-01-11 MED ORDER — CILOSTAZOL 50 MG PO TABS: 50.0000 mg | ORAL_TABLET | Freq: Two times a day (BID) | ORAL | 2 refills | Status: DC

## 2023-01-11 NOTE — Assessment & Plan Note (Signed)
Johnny [name] was referred to me by Dr. Posey Rea for evaluation of PAD.  He does have a history of CAD and other risk factors for vascular disease including treated hypertension, diabetes and hyperlipidemia.  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  He is noticed bilateral lower extremity claudication which is symmetric for the last year which is lifestyle limiting.  He had ABIs performed 09/12/2022 which revealed monophasic waveforms, right ABI of 0.59 and left of 0.90.  He did see Dr. Carolynn Sayers who recommended cilostazol.  I agree with a trial of Pletal 50 mg p.o. twice daily.  I will see him back in 3 months.  If he does not have symptomatic relief we will consider endovascular therapy.

## 2023-01-11 NOTE — Progress Notes (Signed)
01/11/2023 Johnny [name]   [birth year]  [id number]  Primary Physician Plotnikov, Georgina Quint, MD Primary Cardiologist: Runell Gess MD Nicholes Calamity, [place]  HPI:  Johnny [name] is a 77 y.o. mild to moderately overweight married African-American male father of 2 living daughters (1 son deceased), grandfather to 4 grandchildren who is retired Merchandiser, retail for [employer].  He is accompanied by his wife Angeline.  He was referred by Dr. Posey Rea, his primary care provider, for evaluation of PAD.  He apparently did see Dr. Carolynn Sayers of vein and vascular specialist for this who recommended pharmacologic therapy with Pletal however the patient wished to pursue other options.  He does have a history of CAD status post remote CABG in 1999.  He is remained stable from a cardiovascular point of view since.  He smoked remotely and stopped 40 years ago.  He does have history of treated hypertension, diabetes and hyperlipidemia.  He is never had a heart attack or stroke.  He denies chest pain or shortness of breath.  He did notice new onset claudication which is symmetric and bilateral over the last year.  This has been lifestyle-limiting.  He had Doppler studies performed 09/12/2022 revealing a right ABI of 0.59 and a left of 0.90.   Current Meds  Medication Sig   Accu-Chek Softclix Lancets lancets Use as instructed to check blood sugars 1 times a day, fasting   albuterol (VENTOLIN HFA) 108 (90 Base) MCG/ACT inhaler INHALE 2 PUFFS INTO THE LUNGS EVERY 4 HOURS AS NEEDED FOR WHEEZING OR SHORTNESS OF BREATH   Alcohol Swabs (DROPSAFE ALCOHOL PREP) 70 % PADS USE AS DIRECTED   allopurinol (ZYLOPRIM) 100 MG tablet TAKE 1 TABLET EVERY DAY   aspirin 81 MG EC tablet Take 2 tablets (162 mg total) by mouth daily. Swallow whole.   Blood Glucose Calibration (ACCU-CHEK AVIVA) SOLN Use as directed as needed.   Blood Glucose Monitoring Suppl (ACCU-CHEK GUIDE ME) w/Device KIT Use as instructed to check  blood sugars 1 times a day, fasting   cholecalciferol (VITAMIN D) 1000 UNITS tablet Take 1,000 Units by mouth every morning.   colchicine 0.6 MG tablet Take 1 tablet (0.6 mg total) by mouth daily.   dapagliflozin propanediol (FARXIGA) 10 MG TABS tablet Take 1 tablet (10 mg total) by mouth daily before breakfast.   finasteride (PROSCAR) 5 MG tablet TAKE 1 TABLET EVERY MORNING   fluticasone furoate-vilanterol (BREO ELLIPTA) 100-25 MCG/INH AEPB Inhale 1 puff into the lungs daily.   glipiZIDE (GLUCOTROL) 5 MG tablet Take 1 tablet (5 mg total) by mouth 2 (two) times daily before a meal.   glucose blood (ACCU-CHEK GUIDE) test strip Use as instructed, check blood sugars 1 times a day, fasting   irbesartan-hydrochlorothiazide (AVALIDE) 300-12.5 MG tablet TAKE 1 TABLET EVERY DAY   Lancet Devices (ACCU-CHEK SOFTCLIX) lancets Use twice daily as instructed. Dx: 250.00   Lancets (ONETOUCH DELICA PLUS LANCET33G) MISC USE TWICE DAILY AS DIRECTED   lovastatin (MEVACOR) 40 MG tablet TAKE 1 TABLET EVERY MORNING   Multiple Vitamins-Minerals (MULTI-VITAMIN GUMMIES PO) Take 1 tablet by mouth daily.   nitroGLYCERIN (NITROSTAT) 0.4 MG SL tablet Place 1 tablet (0.4 mg total) under the tongue every 5 (five) minutes as needed for chest pain.   ONETOUCH ULTRA test strip USE TWICE DAILY AS  INSTRUCTED   pantoprazole (PROTONIX) 40 MG tablet TAKE 1 TABLET EVERY MORNING   Semaglutide (RYBELSUS) 7 MG TABS Take 1 tablet (7 mg total) by  mouth daily.   sildenafil (VIAGRA) 100 MG tablet Take 0.5-1 tablets (50-100 mg total) by mouth daily as needed for erectile dysfunction.   tamsulosin (FLOMAX) 0.4 MG CAPS capsule TAKE 1 CAPSULE EVERY DAY     Allergies  Allergen Reactions   Enalapril     SOB   Hydrocodone-Acetaminophen Nausea Only    Can take codein    Social History   Socioeconomic History   Marital status: Married    Spouse name: Not on file   Number of children: 2   Years of education: Not on file   Highest  education level: Not on file  Occupational History   Occupation: Neurosurgeon: RETIRED  Tobacco Use   Smoking status: Former    Current packs/day: 0.00    Types: Cigarettes    Quit date: 05/22/1970    Years since quitting: 52.6   Smokeless tobacco: Never  Vaping Use   Vaping status: Never Used  Substance and Sexual Activity   Alcohol use: No    Comment: seldom   Drug use: No   Sexual activity: Not Currently  Other Topics Concern   Not on file  Social History Narrative   Regular Exercise-Yes (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)   Married; has 2 daughters   He is a Clinical research associate Strain: Low Risk  (05/16/2022)   Overall Financial Resource Strain (CARDIA)    Difficulty of Paying Living Expenses: Not hard at all  Food Insecurity: No Food Insecurity (05/16/2022)   Hunger Vital Sign    Worried About Running Out of Food in the Last Year: Never true    Ran Out of Food in the Last Year: Never true  Transportation Needs: No Transportation Needs (05/16/2022)   PRAPARE - Administrator, Civil Service (Medical): No    Lack of Transportation (Non-Medical): No  Physical Activity: Sufficiently Active (05/16/2022)   Exercise Vital Sign    Days of Exercise per Week: 5 days    Minutes of Exercise per Session: 60 min  Stress: No Stress Concern Present (05/16/2022)   [employer] of Occupational Health - Occupational Stress Questionnaire    Feeling of Stress : Not at all  Social Connections: Socially Integrated (05/16/2022)   Social Connection and Isolation Panel [NHANES]    Frequency of Communication with Friends and Family: More than three times a week    Frequency of Social Gatherings with Friends and Family: More than three times a week    Attends Religious Services: More than 4 times per year    Active Member of [employer] or Organizations: Yes    Attends Engineer, structural: More than 4  times per year    Marital Status: Married  Catering manager Violence: Not At Risk (05/16/2022)   Humiliation, Afraid, Rape, and Kick questionnaire    Fear of Current or Ex-Partner: No    Emotionally Abused: No    Physically Abused: No    Sexually Abused: No     Review of Systems: General: negative for chills, fever, night sweats or weight changes.  Cardiovascular: negative for chest pain, dyspnea on exertion, edema, orthopnea, palpitations, paroxysmal nocturnal dyspnea or shortness of breath Dermatological: negative for rash Respiratory: negative for cough or wheezing Urologic: negative for hematuria Abdominal: negative for nausea, vomiting, diarrhea, bright red blood per rectum, melena, or hematemesis Neurologic: negative for visual changes, syncope, or dizziness All  other systems reviewed and are otherwise negative except as noted above.    Blood pressure 130/60, pulse 72, height 5\' 11"  (1.803 m), weight 221 lb (100.2 kg), SpO2 99%.  General appearance: alert and no distress Neck: no adenopathy, no JVD, supple, symmetrical, trachea midline, thyroid not enlarged, symmetric, no tenderness/mass/nodules, and soft bilateral carotid bruits Lungs: clear to auscultation bilaterally Heart: regular rate and rhythm, S1, S2 normal, no murmur, click, rub or gallop Extremities: extremities normal, atraumatic, no cyanosis or edema Pulses: Diminished pedal pulses bilaterally Skin: Skin color, texture, turgor normal. No rashes or lesions Neurologic: Grossly normal  EKG EKG Interpretation Date/Time:  Wednesday January 11 2023 12:16:56 EST Ventricular Rate:  72 PR Interval:  144 QRS Duration:  76 QT Interval:  386 QTC Calculation: 422 R Axis:   21  Text Interpretation: Normal sinus rhythm Septal infarct , age undetermined When compared with ECG of 20-Jul-2013 09:00, PREVIOUS ECG IS PRESENT Confirmed by Nanetta Batty [phone]) on 01/11/2023 12:22:43 PM    ASSESSMENT AND PLAN:   PAD  (peripheral artery disease) (HCC) Mr. Czarnecki was referred to me by Dr. Posey Rea for evaluation of PAD.  He does have a history of CAD and other risk factors for vascular disease including treated hypertension, diabetes and hyperlipidemia.  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  He is noticed bilateral lower extremity claudication which is symmetric for the last year which is lifestyle limiting.  He had ABIs performed 09/12/2022 which revealed monophasic waveforms, right ABI of 0.59 and left of 0.90.  He did see Dr. Carolynn Sayers who recommended cilostazol.  I agree with a trial of Pletal 50 mg p.o. twice daily.  I will see him back in 3 months.  If he does not have symptomatic relief we will consider endovascular therapy.     Runell Gess MD FACP,FACC,FAHA, [facility] 01/11/2023 12:42 PMSo clearance Peyton Eyemycin clearance will

## 2023-01-11 NOTE — Patient Instructions (Addendum)
Medication Instructions:   Pletal  ( Cilostazol  ) 50 mg  twice a day  *If you need a refill on your cardiac medications before your next appointment, please call your pharmacy*   Lab Work: Not needed .   Testing/Procedures:  Will be  schedule at [address] Your physician has requested that you have a lower extremity arterial duplex. This test is an ultrasound of the arteries in the legs. It looks at arterial blood flow in the legs. Allow one hour for Lower Arterial scans. There are no restrictions or special instructions. .  Please note: We ask at that you not bring children with you during ultrasound (echo/ vascular) testing. Due to room size and safety concerns, children are not allowed in the ultrasound rooms during exams. Our front office staff cannot provide observation of children in our lobby area while testing is being conducted. An adult accompanying a patient to their appointment will only be allowed in the ultrasound room at the discretion of the ultrasound technician under special circumstances. We apologize for any inconvenience.    And  Your physician has requested that you have a carotid duplex. This test is an ultrasound of the carotid arteries in your neck. It looks at blood flow through these arteries that supply the brain with blood. Allow one hour for this exam. There are no restrictions or special instructions   Follow-Up: At [facility], you and your health needs are our priority.  As part of our continuing mission to provide you with exceptional heart care, we have created designated Provider Care Teams.  These Care Teams include your primary Cardiologist (physician) and Advanced Practice Providers (APPs -  Physician Assistants and Nurse Practitioners) who all work together to provide you with the care you need, when you need it.     Your next appointment:   3 month(s)  The format for your next appointment:   In Person  Provider:    Nanetta Batty, MD

## 2023-01-20 ENCOUNTER — Telehealth: Payer: Self-pay | Admitting: Cardiovascular Disease

## 2023-01-20 NOTE — Telephone Encounter (Signed)
Patient identification verified by 2 forms. Marilynn Rail, RN   Called and spoke to patient wife Angeline Angeline states:   -patient reports SOB with Pletal Rx   -His last dose was Tuesday   -he discontinued it due to symptoms   -since medication causing SOB, unsure if best to proceed with procedure   -SOB resolved since stopping medication  Informed Angeline message sent to Dr. Allyson Sabal for input/advisement  Edson Snowball agrees with plan, no questions at this time

## 2023-01-20 NOTE — Telephone Encounter (Signed)
Pt c/o medication issue:  1. Name of Medication:   cilostazol (PLETAL) 50 MG tablet    2. How are you currently taking this medication (dosage and times per day)?    3. Are you having a reaction (difficulty breathing--STAT)? no  4. What is your medication issue? Patient states its making him short of breath. Calling to see what needs to be done. Please advise

## 2023-01-20 NOTE — Telephone Encounter (Signed)
 Attempted to call patient, no answer left message requesting a call back.

## 2023-01-20 NOTE — Telephone Encounter (Signed)
Attempted to return call. No answer.

## 2023-01-20 NOTE — Telephone Encounter (Signed)
Pt spouse returning call  

## 2023-01-23 NOTE — Telephone Encounter (Signed)
Patient identification verified by 2 forms. Marilynn Rail, RN   Called and spoke to patients wife Angeline  Relayed provider message below  Angeline verbalize understanding, no questions at this time

## 2023-01-23 NOTE — Telephone Encounter (Signed)
Runell Gess, MD  You; Marilynn Rail, RN2 days ago    Needs LEAs then back to see me.   Pt has LEA dopplers and carotids scheduled for 11/25. Follow up office visit is already scheduled at this time. Will access if follow up needs to be sooner based on results of dopplers.

## 2023-01-26 ENCOUNTER — Other Ambulatory Visit: Payer: Self-pay | Admitting: Cardiovascular Disease

## 2023-01-26 DIAGNOSIS — M79605 Pain in left leg: Secondary | ICD-10-CM

## 2023-01-26 DIAGNOSIS — I739 Peripheral vascular disease, unspecified: Secondary | ICD-10-CM

## 2023-01-30 ENCOUNTER — Ambulatory Visit (HOSPITAL_COMMUNITY)
Admission: RE | Admit: 2023-01-30 | Discharge: 2023-01-30 | Disposition: A | Discharge status: Home / Self Care | Payer: Medicare HMO | Source: Ambulatory Visit | Attending: Cardiology | Admitting: Cardiology

## 2023-01-30 ENCOUNTER — Ambulatory Visit (HOSPITAL_BASED_OUTPATIENT_CLINIC_OR_DEPARTMENT_OTHER)
Admission: RE | Admit: 2023-01-30 | Discharge: 2023-01-30 | Disposition: A | Discharge status: Home / Self Care | Payer: Medicare HMO | Source: Ambulatory Visit | Attending: Cardiovascular Disease | Admitting: Cardiovascular Disease

## 2023-01-30 DIAGNOSIS — R0989 Other specified symptoms and signs involving the circulatory and respiratory systems: Secondary | ICD-10-CM | POA: Insufficient documentation

## 2023-01-30 DIAGNOSIS — M79604 Pain in right leg: Secondary | ICD-10-CM | POA: Diagnosis not present

## 2023-01-30 DIAGNOSIS — M79605 Pain in left leg: Secondary | ICD-10-CM | POA: Diagnosis not present

## 2023-01-30 DIAGNOSIS — I739 Peripheral vascular disease, unspecified: Secondary | ICD-10-CM | POA: Diagnosis not present

## 2023-02-01 LAB — VAS US ABI WITH/WO TBI
Left ABI: 0.76
Right ABI: 0.7

## 2023-02-20 ENCOUNTER — Encounter: Payer: Self-pay | Admitting: Cardiovascular Disease

## 2023-02-20 ENCOUNTER — Ambulatory Visit: Payer: Medicare HMO | Attending: Cardiovascular Disease | Admitting: Cardiovascular Disease

## 2023-02-20 VITALS — BP 140/60 | HR 68 | Ht 70.5 in | Wt 222.0 lb

## 2023-02-20 DIAGNOSIS — I739 Peripheral vascular disease, unspecified: Secondary | ICD-10-CM | POA: Diagnosis not present

## 2023-02-20 NOTE — Addendum Note (Signed)
Addended by: Bernita Buffy on: 02/20/2023 02:42 PM   Modules accepted: Orders

## 2023-02-20 NOTE — Patient Instructions (Addendum)
Medication Instructions:  Your physician recommends that you continue on your current medications as directed. Please refer to the Current Medication list given to you today.  *If you need a refill on your cardiac medications before your next appointment, please call your pharmacy*   Follow-Up: At [facility], you and your health needs are our priority.  As part of our continuing mission to provide you with exceptional heart care, we have created designated Provider Care Teams.  These Care Teams include your primary Cardiologist (physician) and Advanced Practice Providers (APPs -  Physician Assistants and Nurse Practitioners) who all work together to provide you with the care you need, when you need it.  We recommend signing up for the patient portal called "MyChart".  Sign up information is provided on this After Visit Summary.  MyChart is used to connect with patients for Virtual Visits (Telemedicine).  Patients are able to view lab/test results, encounter notes, upcoming appointments, etc.  Non-urgent messages can be sent to your provider as well.   To learn more about what you can do with MyChart, go to [email or web address].    Your next appointment:   6 month(s)  Provider:   Jonathan Berry, MD    

## 2023-02-20 NOTE — Progress Notes (Signed)
Johnny [name] returns today for follow-up of his PAD.  He was intolerant of Pletal.  He still has lifestyle-limiting claudication.  Unfortunately, his serum creatinine is 2.26 with a creatinine clearance of about 30 cc/min.  Unfortunately, because of this I am unable to proceed with outpatient endovascular therapy with contrast because of the risk of radiocontrast nephropathy.  I will see him back in 6 months with follow-up.  Runell Gess, M.D., FACP, [facility], [facility] [address] [place], [place]  [address]  [phone] 02/20/2023 2:38 PM

## 2023-03-07 ENCOUNTER — Ambulatory Visit: Payer: Medicare HMO | Admitting: Podiatry

## 2023-03-07 ENCOUNTER — Encounter: Payer: Self-pay | Admitting: Podiatry

## 2023-03-07 DIAGNOSIS — M79674 Pain in right toe(s): Secondary | ICD-10-CM | POA: Diagnosis not present

## 2023-03-07 DIAGNOSIS — L988 Other specified disorders of the skin and subcutaneous tissue: Secondary | ICD-10-CM | POA: Diagnosis not present

## 2023-03-07 DIAGNOSIS — E1151 Type 2 diabetes mellitus with diabetic peripheral angiopathy without gangrene: Secondary | ICD-10-CM | POA: Diagnosis not present

## 2023-03-07 DIAGNOSIS — M79675 Pain in left toe(s): Secondary | ICD-10-CM | POA: Diagnosis not present

## 2023-03-07 DIAGNOSIS — B351 Tinea unguium: Secondary | ICD-10-CM | POA: Diagnosis not present

## 2023-03-07 NOTE — Progress Notes (Signed)
 Subjective:   Patient ID: Johnny [name], male   DOB: 77 y.o.   MRN: [id number]   HPI Chief Complaint  Patient presents with   [facility]    RM#11 [facility] Peripheral arterial disease patient states here for nail trim no other concerns today.   77 year old male presents the office with above concerns.  He follows with Dr. Dorn Lesches for PAD and is referred here for nail care.  He typically goes to get a pedicure given circulation is referred.  Currently denies any swelling redness or any drainage.  He was previously on anticoagulation also with aspirin  however he was having side effects that he discontinued this.  No open lesions today reports.  Last A1c was 8.1 on 11/14/2022  Review of Systems  All other systems reviewed and are negative.  Past Medical History:  Diagnosis Date   Arthritis    At risk for sleep apnea    STOP-BANG=  6   SENT TO PCP 05-21-2013   Benign positional vertigo    Benign prostatic hypertrophy    Blood transfusion without reported diagnosis 2000   CAD (coronary artery disease)    CARDIOLOGIST--  DR DANELLE BIRMINGHAM   CHF (congestive heart failure) (HCC)    Chronic ischemic heart disease, unspecified    ED (erectile dysfunction) of organic origin    GERD (gastroesophageal reflux disease)    History of CHF (congestive heart failure)    systolic   HTN (hypertension)    Hyperlipidemia    Tubular adenoma of colon 12/2009   Type 2 diabetes mellitus (HCC)    Wears glasses     Past Surgical History:  Procedure Laterality Date   CARDIAC CATHETERIZATION  03-03-2000  dr danelle taylor   normal lvsf/  two-vessel cad significant complex stenosis at distal left main   CARDIOVASCULAR STRESS TEST  08-09-2010  DR DANELLE TAYLOR   normal lexiscan  nuclear study/  no ischemia/  normal lvf/  ef 54%   CATARACT EXTRACTION W/ INTRAOCULAR LENS IMPLANT Right 03/07/2009   COLONOSCOPY  10/09/2020   12/30/2009   CORONARY ARTERY BYPASS GRAFT  03-06-2000   DR GERHARDT   third vessel   PENILE  PROSTHESIS IMPLANT  02/08/2010   COLOPLAST 3-PIECE INFLATABLE   PENILE PROSTHESIS IMPLANT N/A 05/27/2013   Procedure: CYSTO REMOVAL OF PENILE PROSTHESIS;  Surgeon: Mark C Ottelin, MD;  Location: [facility];  Service: Urology;  Laterality: N/A;   POLYPECTOMY  12/30/2009   +TA   TOTAL HIP ARTHROPLASTY  01/24/2011   Procedure: TOTAL HIP ARTHROPLASTY;  Surgeon: Dempsey GAILS [name];  Location: [facility];  Service: Orthopedics;  Laterality: Left;   TOTAL HIP ARTHROPLASTY Right 01/30/2013   Procedure: RIGHT TOTAL HIP ARTHROPLASTY;  Surgeon: Dempsey GAILS Moan, MD;  Location: [facility];  Service: Orthopedics;  Laterality: Right;     Current Outpatient Medications:    Accu-Chek Softclix Lancets lancets, Use as instructed to check blood sugars 1 times a day, fasting, Disp: 100 each, Rfl: 12   albuterol  (VENTOLIN  HFA) 108 (90 Base) MCG/ACT inhaler, INHALE 2 PUFFS INTO THE LUNGS EVERY 4 HOURS AS NEEDED FOR WHEEZING OR SHORTNESS OF BREATH, Disp: 18 g, Rfl: 5   Alcohol  Swabs (DROPSAFE ALCOHOL  PREP) 70 % PADS, USE AS DIRECTED, Disp: 100 each, Rfl: 3   allopurinol  (ZYLOPRIM ) 100 MG tablet, TAKE 1 TABLET EVERY DAY, Disp: 90 tablet, Rfl: 3   aspirin  81 MG EC tablet, Take 2 tablets (162 mg total) by mouth daily. Swallow whole., Disp: 100  tablet, Rfl: 3   Blood Glucose Calibration (ACCU-CHEK AVIVA) SOLN, Use as directed as needed., Disp: 1 each, Rfl: 2   Blood Glucose Monitoring Suppl (ACCU-CHEK GUIDE ME) w/Device KIT, Use as instructed to check blood sugars 1 times a day, fasting, Disp: 1 kit, Rfl: 0   cholecalciferol (VITAMIN D) 1000 UNITS tablet, Take 1,000 Units by mouth every morning., Disp: , Rfl:    cilostazol  (PLETAL ) 50 MG tablet, Take 1 tablet (50 mg total) by mouth 2 (two) times daily., Disp: 180 tablet, Rfl: 2   colchicine  0.6 MG tablet, Take 1 tablet (0.6 mg total) by mouth daily., Disp: 90 tablet, Rfl: 3   dapagliflozin  propanediol (FARXIGA ) 10 MG TABS tablet, Take 1 tablet (10 mg total) by  mouth daily before breakfast., Disp: 30 tablet, Rfl: 5   empagliflozin  (JARDIANCE ) 25 MG TABS tablet, Take 1 tablet (25 mg total) by mouth daily before breakfast., Disp: 90 tablet, Rfl: 3   finasteride  (PROSCAR ) 5 MG tablet, TAKE 1 TABLET EVERY MORNING, Disp: 90 tablet, Rfl: 3   fluticasone  furoate-vilanterol (BREO ELLIPTA ) 100-25 MCG/INH AEPB, Inhale 1 puff into the lungs daily., Disp: 1 each, Rfl: 5   glipiZIDE  (GLUCOTROL ) 5 MG tablet, Take 1 tablet (5 mg total) by mouth 2 (two) times daily before a meal., Disp: 90 tablet, Rfl: 0   glucose blood (ACCU-CHEK GUIDE) test strip, Use as instructed, check blood sugars 1 times a day, fasting, Disp: 100 each, Rfl: 12   irbesartan -hydrochlorothiazide  (AVALIDE) 300-12.5 MG tablet, TAKE 1 TABLET EVERY DAY, Disp: 90 tablet, Rfl: 3   Lancet Devices (ACCU-CHEK SOFTCLIX) lancets, Use twice daily as instructed. Dx: 250.00, Disp: 1 each, Rfl: 3   Lancets (ONETOUCH DELICA PLUS LANCET33G) MISC, USE TWICE DAILY AS DIRECTED, Disp: 200 each, Rfl: 3   lovastatin  (MEVACOR ) 40 MG tablet, TAKE 1 TABLET EVERY MORNING, Disp: 90 tablet, Rfl: 3   Multiple Vitamins-Minerals (MULTI-VITAMIN GUMMIES PO), Take 1 tablet by mouth daily., Disp: , Rfl:    nitroGLYCERIN  (NITROSTAT ) 0.4 MG SL tablet, Place 1 tablet (0.4 mg total) under the tongue every 5 (five) minutes as needed for chest pain., Disp: 25 tablet, Rfl: 3   ONETOUCH ULTRA test strip, USE TWICE DAILY AS  INSTRUCTED, Disp: 200 strip, Rfl: 3   pantoprazole  (PROTONIX ) 40 MG tablet, TAKE 1 TABLET EVERY MORNING, Disp: 90 tablet, Rfl: 3   Semaglutide  (RYBELSUS ) 7 MG TABS, Take 1 tablet (7 mg total) by mouth daily., Disp: , Rfl:    sildenafil  (VIAGRA ) 100 MG tablet, Take 0.5-1 tablets (50-100 mg total) by mouth daily as needed for erectile dysfunction., Disp: 12 tablet, Rfl: 11   tamsulosin  (FLOMAX ) 0.4 MG CAPS capsule, TAKE 1 CAPSULE EVERY DAY, Disp: 90 capsule, Rfl: 3  Allergies  Allergen Reactions   Enalapril      SOB    Hydrocodone-Acetaminophen  Nausea Only    Can take codein           Objective:  Physical Exam  General: AAO x3, NAD  Dermatological: Nails are hypertrophic, dystrophic with yellow, brown discoloration and they are brittle with subungual debris present.  There is no edema, erythema or signs of infection.  There is no open lesions identified.  On the third but also the fourth interspace there is macerated tissue present.  There is no skin breakdown 1.  No signs of infection at this time.  Vascular: Dorsalis Pedis artery and Posterior Tibial artery pedal pulses are decreased bilateral. There is no pain with calf compression, swelling, warmth, erythema.  Neruologic: Sensation intact with Gustabo Speed monofilament.  Musculoskeletal: No other areas of discomfort.  Assessment:   Symptomatic onychomycosis, PAD; maceration     Plan:  -Treatment options discussed including all alternatives, risks, and complications -Etiology of symptoms were discussed -Nails debrided 10 without complications or bleeding. -Discussed drying thoroughly between the toes.  I performed a Betadine  to help the tissue.  Not concerned of symptoms of infection or skin breakdown.  Discussed powders that he can use inside of his shoes and wants to stop with the moisture control. -Daily foot inspection -Follow-up in 3 months or sooner if any problems arise. In the meantime, encouraged to call the office with any questions, concerns, change in symptoms.   Donnice Fees, DPM

## 2023-03-11 ENCOUNTER — Other Ambulatory Visit: Payer: Self-pay | Admitting: Internal Medicine

## 2023-03-13 ENCOUNTER — Ambulatory Visit (INDEPENDENT_AMBULATORY_CARE_PROVIDER_SITE_OTHER): Payer: Medicare (Managed Care) | Admitting: Internal Medicine

## 2023-03-13 ENCOUNTER — Encounter: Payer: Self-pay | Admitting: Internal Medicine

## 2023-03-13 VITALS — HR 87 | Temp 98.0°F | Ht 70.5 in | Wt 220.0 lb

## 2023-03-13 DIAGNOSIS — M79605 Pain in left leg: Secondary | ICD-10-CM

## 2023-03-13 DIAGNOSIS — E1142 Type 2 diabetes mellitus with diabetic polyneuropathy: Secondary | ICD-10-CM | POA: Diagnosis not present

## 2023-03-13 DIAGNOSIS — E1159 Type 2 diabetes mellitus with other circulatory complications: Secondary | ICD-10-CM | POA: Diagnosis not present

## 2023-03-13 DIAGNOSIS — Z7984 Long term (current) use of oral hypoglycemic drugs: Secondary | ICD-10-CM

## 2023-03-13 DIAGNOSIS — E1122 Type 2 diabetes mellitus with diabetic chronic kidney disease: Secondary | ICD-10-CM

## 2023-03-13 DIAGNOSIS — M79604 Pain in right leg: Secondary | ICD-10-CM

## 2023-03-13 DIAGNOSIS — I2510 Atherosclerotic heart disease of native coronary artery without angina pectoris: Secondary | ICD-10-CM

## 2023-03-13 DIAGNOSIS — N1832 Chronic kidney disease, stage 3b: Secondary | ICD-10-CM

## 2023-03-13 DIAGNOSIS — I739 Peripheral vascular disease, unspecified: Secondary | ICD-10-CM

## 2023-03-13 NOTE — Progress Notes (Signed)
 Subjective:  Patient ID: Johnny [name], male    DOB: 01-03-1946  Age: 78 y.o. MRN: [id number]  CC: Medical Management of Chronic Issues (3 mnth f/u)   HPI TAVEN STRITE presents for DM, CAD, PAD  Outpatient Medications Prior to Visit  Medication Sig Dispense Refill   Accu-Chek Softclix Lancets lancets Use as instructed to check blood sugars 1 times a day, fasting 100 each 12   albuterol  (VENTOLIN  HFA) 108 (90 Base) MCG/ACT inhaler INHALE 2 PUFFS INTO THE LUNGS EVERY 4 HOURS AS NEEDED FOR WHEEZING OR SHORTNESS OF BREATH 18 g 5   Alcohol  Swabs (DROPSAFE ALCOHOL  PREP) 70 % PADS USE AS DIRECTED 100 each 3   allopurinol  (ZYLOPRIM ) 100 MG tablet TAKE 1 TABLET EVERY DAY 90 tablet 3   aspirin  81 MG EC tablet Take 2 tablets (162 mg total) by mouth daily. Swallow whole. 100 tablet 3   Blood Glucose Calibration (ACCU-CHEK AVIVA) SOLN Use as directed as needed. 1 each 2   Blood Glucose Monitoring Suppl (ACCU-CHEK GUIDE ME) w/Device KIT Use as instructed to check blood sugars 1 times a day, fasting 1 kit 0   cholecalciferol (VITAMIN D) 1000 UNITS tablet Take 1,000 Units by mouth every morning.     cilostazol  (PLETAL ) 50 MG tablet Take 1 tablet (50 mg total) by mouth 2 (two) times daily. 180 tablet 2   colchicine  0.6 MG tablet Take 1 tablet (0.6 mg total) by mouth daily. 90 tablet 3   dapagliflozin  propanediol (FARXIGA ) 10 MG TABS tablet Take 1 tablet (10 mg total) by mouth daily before breakfast. 30 tablet 5   empagliflozin  (JARDIANCE ) 25 MG TABS tablet Take 1 tablet (25 mg total) by mouth daily before breakfast. 90 tablet 3   finasteride  (PROSCAR ) 5 MG tablet TAKE 1 TABLET EVERY MORNING 90 tablet 3   fluticasone  furoate-vilanterol (BREO ELLIPTA ) 100-25 MCG/INH AEPB Inhale 1 puff into the lungs daily. 1 each 5   glipiZIDE  (GLUCOTROL ) 5 MG tablet TAKE 1 TABLET BY MOUTH TWICE DAILY BEFORE A MEAL 180 tablet 3   glucose blood (ACCU-CHEK GUIDE) test strip Use as instructed, check blood sugars 1 times a  day, fasting 100 each 12   irbesartan -hydrochlorothiazide  (AVALIDE) 300-12.5 MG tablet TAKE 1 TABLET EVERY DAY 90 tablet 3   Lancet Devices (ACCU-CHEK SOFTCLIX) lancets Use twice daily as instructed. Dx: 250.00 1 each 3   Lancets (ONETOUCH DELICA PLUS LANCET33G) MISC USE TWICE DAILY AS DIRECTED 200 each 3   lovastatin  (MEVACOR ) 40 MG tablet TAKE 1 TABLET EVERY MORNING 90 tablet 3   Multiple Vitamins-Minerals (MULTI-VITAMIN GUMMIES PO) Take 1 tablet by mouth daily.     nitroGLYCERIN  (NITROSTAT ) 0.4 MG SL tablet Place 1 tablet (0.4 mg total) under the tongue every 5 (five) minutes as needed for chest pain. 25 tablet 3   ONETOUCH ULTRA test strip USE TWICE DAILY AS  INSTRUCTED 200 strip 3   pantoprazole  (PROTONIX ) 40 MG tablet TAKE 1 TABLET EVERY MORNING 90 tablet 3   Semaglutide  (RYBELSUS ) 7 MG TABS Take 1 tablet (7 mg total) by mouth daily.     sildenafil  (VIAGRA ) 100 MG tablet Take 0.5-1 tablets (50-100 mg total) by mouth daily as needed for erectile dysfunction. 12 tablet 11   tamsulosin  (FLOMAX ) 0.4 MG CAPS capsule TAKE 1 CAPSULE EVERY DAY 90 capsule 3   No facility-administered medications prior to visit.    ROS: Review of Systems  Constitutional:  Negative for appetite change, fatigue and unexpected weight change.  HENT:  Negative for congestion, nosebleeds, sneezing, sore throat and trouble swallowing.   Eyes:  Negative for itching and visual disturbance.  Respiratory:  Negative for cough.   Cardiovascular:  Negative for chest pain, palpitations and leg swelling.  Gastrointestinal:  Negative for abdominal distention, blood in stool, diarrhea and nausea.  Genitourinary:  Negative for frequency and hematuria.  Musculoskeletal:  Positive for arthralgias and gait problem. Negative for back pain, joint swelling and neck pain.  Skin:  Negative for rash.  Neurological:  Negative for dizziness, tremors, speech difficulty and weakness.  Psychiatric/Behavioral:  Negative for agitation,  dysphoric mood, sleep disturbance and suicidal ideas. The patient is not nervous/anxious.     Objective:  Pulse 87   Temp 98 F (36.7 C) (Oral)   Ht 5' 10.5 (1.791 m)   Wt 220 lb (99.8 kg)   SpO2 98%   BMI 31.12 kg/m   BP Readings from Last 3 Encounters:  02/20/23 (!) 140/60  01/11/23 130/60  12/23/22 115/70    Wt Readings from Last 3 Encounters:  03/13/23 220 lb (99.8 kg)  02/20/23 222 lb (100.7 kg)  01/11/23 221 lb (100.2 kg)    Physical Exam Constitutional:      General: He is not in acute distress.    Appearance: He is well-developed. He is obese.     Comments: NAD  Eyes:     Conjunctiva/sclera: Conjunctivae normal.     Pupils: Pupils are equal, round, and reactive to light.  Neck:     Thyroid : No thyromegaly.     Vascular: No JVD.  Cardiovascular:     Rate and Rhythm: Normal rate and regular rhythm.     Heart sounds: Normal heart sounds. No murmur heard.    No friction rub. No gallop.  Pulmonary:     Effort: Pulmonary effort is normal. No respiratory distress.     Breath sounds: Normal breath sounds. No wheezing or rales.  Chest:     Chest Rhyne: No tenderness.  Abdominal:     General: Bowel sounds are normal. There is no distension.     Palpations: Abdomen is soft. There is no mass.     Tenderness: There is no abdominal tenderness. There is no guarding or rebound.  Musculoskeletal:        General: No tenderness. Normal range of motion.     Cervical back: Normal range of motion.  Lymphadenopathy:     Cervical: No cervical adenopathy.  Skin:    General: Skin is warm and dry.     Findings: No rash.  Neurological:     Mental Status: He is alert and oriented to person, place, and time.     Cranial Nerves: No cranial nerve deficit.     Motor: No abnormal muscle tone.     Coordination: Coordination normal.     Gait: Gait normal.     Deep Tendon Reflexes: Reflexes are normal and symmetric.  Psychiatric:        Behavior: Behavior normal.        Thought  Content: Thought content normal.        Judgment: Judgment normal.     Lab Results  Component Value Date   WBC 7.1 12/16/2019   HGB 12.4 (L) 12/16/2019   HCT 36.5 (L) 12/16/2019   PLT 230.0 12/16/2019   GLUCOSE 62 (L) 12/08/2022   CHOL 200 06/07/2022   TRIG 299.0 (H) 06/07/2022   HDL 34.60 (L) 06/07/2022   LDLDIRECT 93.0 06/07/2022   LDLCALC 74 11/29/2013  ALT 11 12/08/2022   AST 15 12/08/2022   NA 136 12/08/2022   K 4.6 12/08/2022   CL 104 12/08/2022   CREATININE 2.26 (H) 12/08/2022   BUN 22 12/08/2022   CO2 28 12/08/2022   TSH 6.61 (H) 03/08/2022   PSA 1.51 12/01/2021   INR 1.15 01/21/2013   HGBA1C 7.7 (H) 12/08/2022   MICROALBUR <0.7 06/07/2022    VAS US  CAROTID Result Date: 02/01/2023 Carotid Arterial Duplex Study Patient Name:  VANE YAPP Frediani  Date of Exam:   01/30/2023 Medical Rec #: [id number]        Accession #:    [id number] Date of Birth: 1945-06-25        Patient Gender: M Patient Age:   25 years Exam Location:  [facility] Procedure:      VAS US  CAROTID Referring Phys: JONATHAN BERRY --------------------------------------------------------------------------------  Indications:       Bilateral bruits and patient denies any cerebrovascular                    symptoms. Risk Factors:      Hypertension, hyperlipidemia, Diabetes, past history of                    smoking, coronary artery disease. Comparison Study:  NA Performing Technologist: Nanetta Shad RVT  Examination Guidelines: A complete evaluation includes B-mode imaging, spectral Doppler, color Doppler, and power Doppler as needed of all accessible portions of each vessel. Bilateral testing is considered an integral part of a complete examination. Limited examinations for reoccurring indications may be performed as noted.  Right Carotid Findings: +----------+--------+--------+--------+------------------+------------------+           PSV cm/sEDV cm/sStenosisPlaque DescriptionComments            +----------+--------+--------+--------+------------------+------------------+ CCA Prox  57      3                                                    +----------+--------+--------+--------+------------------+------------------+ CCA Distal71      17                                intimal thickening +----------+--------+--------+--------+------------------+------------------+ ICA Prox  49      10      1-39%   heterogenous                         +----------+--------+--------+--------+------------------+------------------+ ICA Distal61      16                                steep dive         +----------+--------+--------+--------+------------------+------------------+ ECA       102     7                                 intimal thickening +----------+--------+--------+--------+------------------+------------------+ +----------+--------+-------+---------+-------------------+           PSV cm/sEDV cmsDescribe Arm Pressure (mmHG) +----------+--------+-------+---------+-------------------+ [id number]            Turbulent175                 +----------+--------+-------+---------+-------------------+ +---------+--------+--+--------+--+---------+ VertebralPSV  cm/s62EDV cm/s11Antegrade +---------+--------+--+--------+--+---------+  Left Carotid Findings: +----------+--------+--------+--------+------------------+---------------------+           PSV cm/sEDV cm/sStenosisPlaque DescriptionComments              +----------+--------+--------+--------+------------------+---------------------+ CCA Prox  80      8                                                       +----------+--------+--------+--------+------------------+---------------------+ CCA Distal96      8                                 turbulent, intimal                                                        thickening             +----------+--------+--------+--------+------------------+---------------------+ ICA Prox  68      11      1-39%   heterogenous                            +----------+--------+--------+--------+------------------+---------------------+ ICA Distal90      17                                steep dive            +----------+--------+--------+--------+------------------+---------------------+ ECA       75      7                                                       +----------+--------+--------+--------+------------------+---------------------+ +----------+--------+--------+----------------+-------------------+           PSV cm/sEDV cm/sDescribe        Arm Pressure (mmHG) +----------+--------+--------+----------------+-------------------+ Dlarojcpjw833             Multiphasic, TWO813                 +----------+--------+--------+----------------+-------------------+ +---------+--------+--+--------+-+---------+ VertebralPSV cm/s34EDV cm/s7Antegrade +---------+--------+--+--------+-+---------+   Summary: Right Carotid: Velocities in the right ICA are consistent with a 1-39% stenosis. Left Carotid: Velocities in the left ICA are consistent with a 1-39% stenosis. Vertebrals:  Bilateral vertebral arteries demonstrate antegrade flow. Subclavians: Right subclavian artery flow was disturbed. Normal flow              hemodynamics were seen in the left subclavian artery. *See table(s) above for measurements and observations.  Electronically signed by Deatrice Cage MD on 02/01/2023 at 1:04:01 PM.    Final    VAS US  LOWER EXTREMITY ARTERIAL DUPLEX Result Date: 02/01/2023 LOWER EXTREMITY ARTERIAL DUPLEX STUDY Patient Name:  Johnny [name]  Date of Exam:   01/30/2023 Medical Rec #: [id number]        Accession #:    [id number] Date of Birth: 12-07-1945        Patient Gender: M Patient Age:   33 years  Exam Location:  [facility] Procedure:      VAS US  LOWER EXTREMITY ARTERIAL DUPLEX Referring  Phys: JONATHAN BERRY --------------------------------------------------------------------------------  Indications: Peripheral artery disease, and patient reports worsening bilateral              calf claudication symptoms after walking about 5-10 minutes. He              denies any rest pain. High Risk Factors: Hypertension, hyperlipidemia, Diabetes, past history of                    smoking, coronary artery disease.  Current ABI: .70 on the right and .76 on the left Comparison Study: NA Performing Technologist: Nanetta Shad RVT  Examination Guidelines: A complete evaluation includes B-mode imaging, spectral Doppler, color Doppler, and power Doppler as needed of all accessible portions of each vessel. Bilateral testing is considered an integral part of a complete examination. Limited examinations for reoccurring indications may be performed as noted.  +-----------+--------+-----+--------+----------+-------------------------------+ RIGHT      PSV cm/sRatioStenosisWaveform  Comments                        +-----------+--------+-----+--------+----------+-------------------------------+ CFA Prox   129                  triphasic                                 +-----------+--------+-----+--------+----------+-------------------------------+ SFA Prox   0            occluded          reconstitution of flow to the                                             mid SFA via collaterals         +-----------+--------+-----+--------+----------+-------------------------------+ SFA Mid    354     15.4         monophasicstenosis based on VR 15.4       +-----------+--------+-----+--------+----------+-------------------------------+ SFA Distal 50                   monophasic                                +-----------+--------+-----+--------+----------+-------------------------------+ POP Prox   51                   monophasic                                 +-----------+--------+-----+--------+----------+-------------------------------+ POP Mid    48                   monophasic                                +-----------+--------+-----+--------+----------+-------------------------------+ POP Distal 77                   monophasic                                +-----------+--------+-----+--------+----------+-------------------------------+  TP Trunk   65                   monophasic                                +-----------+--------+-----+--------+----------+-------------------------------+ ATA Prox   25                                                             +-----------+--------+-----+--------+----------+-------------------------------+ ATA Mid    0            occluded                                          +-----------+--------+-----+--------+----------+-------------------------------+ ATA Distal 0            occluded                                          +-----------+--------+-----+--------+----------+-------------------------------+ PTA Prox   36                   monophasic                                +-----------+--------+-----+--------+----------+-------------------------------+ PTA Mid    46                   monophasic                                +-----------+--------+-----+--------+----------+-------------------------------+ PTA Distal 103                  monophasic                                +-----------+--------+-----+--------+----------+-------------------------------+ PERO Prox  73                   monophasic                                +-----------+--------+-----+--------+----------+-------------------------------+ PERO Mid   42                   monophasic                                +-----------+--------+-----+--------+----------+-------------------------------+ PERO Distal57                   monophasic                                 +-----------+--------+-----+--------+----------+-------------------------------+ A focal velocity elevation of 354 cm/s was obtained at mid SFA with post stenotic turbulence with a VR of 15.4. Findings are characteristic of 75-99% stenosis.  +-----------+--------+-----+---------------+----------+----------------+ LEFT  PSV cm/sRatioStenosis       Waveform  Comments         +-----------+--------+-----+---------------+----------+----------------+ CFA Prox   178          30-49% stenosistriphasic                  +-----------+--------+-----+---------------+----------+----------------+ SFA Prox   213     2.1  50-74% stenosismonophasicdistal to ostium +-----------+--------+-----+---------------+----------+----------------+ SFA Mid    248     2.0  50-74% stenosismonophasic                 +-----------+--------+-----+---------------+----------+----------------+ SFA Distal 79                          monophasic                 +-----------+--------+-----+---------------+----------+----------------+ POP Prox   53                          monophasic                 +-----------+--------+-----+---------------+----------+----------------+ POP Mid    69                          monophasic                 +-----------+--------+-----+---------------+----------+----------------+ POP Distal 78                          monophasic                 +-----------+--------+-----+---------------+----------+----------------+ TP Trunk   74                          monophasic                 +-----------+--------+-----+---------------+----------+----------------+ ATA Prox   87                          monophasic                 +-----------+--------+-----+---------------+----------+----------------+ ATA Mid    73                          monophasic                 +-----------+--------+-----+---------------+----------+----------------+ ATA Distal 58                           monophasic                 +-----------+--------+-----+---------------+----------+----------------+ PTA Prox   35                          monophasic                 +-----------+--------+-----+---------------+----------+----------------+ PTA Mid    20                          monophasic                 +-----------+--------+-----+---------------+----------+----------------+ PTA Distal 0            occluded                                  +-----------+--------+-----+---------------+----------+----------------+  PERO Prox  42                          monophasic                 +-----------+--------+-----+---------------+----------+----------------+ PERO Mid   24                          monophasic                 +-----------+--------+-----+---------------+----------+----------------+ PERO Distal                                      not visualized   +-----------+--------+-----+---------------+----------+----------------+ A focal velocity elevation of 213 cm/s was obtained at proximal SFA distal to ostium with post stenotic turbulence with a VR of 2.1. Findings are characteristic of 50-74% stenosis. A 2nd focal velocity elevation was visualized, measuring 248 cm/s at mid SFA with post stenotic turbulence with a VR of 2.00. Findings are characteristic of 50-74% stenosis.  Summary: Right: Extensive atherosclerosis throughout. Total occlusion of the ostial/proximal SFA. 75-99% stenosis in the mid SFA, based on VR 15.4 Two vessel run-off via PTA and peroneal artery. Occluded mid and distal ATA. Left: Extensive atherosclerosis throughout. 30-49% stenosis in the proximal CFA. 50-74% stenosis in the proximal SFA distal to ostium and mid SFA. At least a one vessel run-off via ATA. Occluded distal PTA; distal peroneal artery not visualized. 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 See  table(s) above for measurements and observations. See ABI report.  Suggest Peripheral Vascular Consult. Electronically signed by Deatrice Cage MD on 02/01/2023 at 1:03:20 PM.    Final    VAS US  ABI WITH/WO TBI Result Date: 02/01/2023  LOWER EXTREMITY DOPPLER STUDY Patient Name:  TRENDON [name]  Date of Exam:   01/30/2023 Medical Rec #: [id number]        Accession #:    [id number] Date of Birth: November 25, 1945        Patient Gender: M Patient Age:   61 years Exam Location:  [facility] Procedure:      VAS US  ABI WITH/WO TBI Referring Phys: DORN BERRY --------------------------------------------------------------------------------  Indications: Peripheral artery disease. Patient reports worsening bilateral calf              claudication symptoms after walking about 5-10 minutes. He denies              any rest pain. High Risk Factors: Hypertension, hyperlipidemia, Diabetes, past history of                    smoking, coronary artery disease.  Comparison Study: In 09/2022, a lower arterial Doppler showed an ABI of .59 on                   the right and .90 on the left. Performing Technologist: Nanetta Shad RVT  Examination Guidelines: A complete evaluation includes at minimum, Doppler waveform signals and systolic blood pressure reading at the level of bilateral brachial, anterior tibial, and posterior tibial arteries, when vessel segments are accessible. Bilateral testing is considered an integral part of a complete examination. Photoelectric Plethysmograph (PPG) waveforms and toe systolic pressure readings are included as required and additional duplex testing as needed. Limited examinations for reoccurring indications may be performed  as noted.  ABI Findings: +---------+------------------+-----+-------------------+--------+ Right    Rt Pressure (mmHg)IndexWaveform           Comment  +---------+------------------+-----+-------------------+--------+ Brachial 175                                                 +---------+------------------+-----+-------------------+--------+ PTA      131               0.70 monophasic                  +---------+------------------+-----+-------------------+--------+ DP       116               0.62 dampened monophasic         +---------+------------------+-----+-------------------+--------+ Great Toe64                0.34 Abnormal                    +---------+------------------+-----+-------------------+--------+ +---------+------------------+-----+-------------------+---------+ Left     Lt Pressure (mmHg)IndexWaveform           Comment   +---------+------------------+-----+-------------------+---------+ Brachial 186                                                 +---------+------------------+-----+-------------------+---------+ PTA      0                 0.00 absent             inaudible +---------+------------------+-----+-------------------+---------+ DP       142               0.76 dampened monophasic          +---------+------------------+-----+-------------------+---------+ Great Toe55                0.30 Abnormal                     +---------+------------------+-----+-------------------+---------+ +-------+-----------+-----------+------------+------------+ ABI/TBIToday's ABIToday's TBIPrevious ABIPrevious TBI +-------+-----------+-----------+------------+------------+ Right  .70        .34        .59         .25          +-------+-----------+-----------+------------+------------+ Left   .76        .30        .90         .27          +-------+-----------+-----------+------------+------------+  Right ABIs and TBIs appear essentially unchanged compared to prior study on 09/13/2022. Left ABIs and TBIs appear decreased compared to prior study on 09/13/2022.  Summary: Right: Resting right ankle-brachial index indicates moderate right lower extremity arterial disease. The right toe-brachial index is abnormal. Left:  Resting left ankle-brachial index indicates moderate left lower extremity arterial disease. The left toe-brachial index is abnormal. *See table(s) above for measurements and observations. See LE Arterial duplex report. Suggest Peripheral Vascular Consult. Electronically signed by Deatrice Cage MD on 02/01/2023 at 1:01:15 PM.    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   Chronic Follow-up with Dr Sam Continue on metformin , Glipizide  Cont on  Losartan       Relevant Orders   Comprehensive metabolic panel   CBC with Differential/Platelet   Coronary atherosclerosis   F/u w/Dr Court Cont on Losartan , Lovastatin , ASA      Relevant Orders   Comprehensive metabolic panel   CBC with Differential/Platelet   Type 2 diabetes mellitus with diabetic polyneuropathy, without long-term current use of insulin  (HCC)   Monitor A1c      Type 2 diabetes mellitus with stage 3b chronic kidney disease, without long-term current use of insulin  (HCC)   Re-start Jardiance  (never took it) 03/2022 Check labs      Leg pain   F/u w/Dr Court R moderate L mild Check arterial Doppler to rule out peripheral arterial disease ASA 162 mg/d      PAD (peripheral artery disease) (HCC)   R moderate L mild         No orders of the defined types were placed in this encounter.     Follow-up: Return in about 3 months (around 06/11/2023) for a follow-up visit.  Marolyn Noel, MD

## 2023-03-13 NOTE — Assessment & Plan Note (Signed)
Chronic Follow-up with Dr Shamleffer Continue on metformin, Glipizide Cont on Losartan 

## 2023-03-13 NOTE — Assessment & Plan Note (Signed)
Re-start Vania Rea (never took it) 03/2022 Check labs

## 2023-03-13 NOTE — Assessment & Plan Note (Signed)
 R moderate L mild

## 2023-03-13 NOTE — Assessment & Plan Note (Signed)
 F/u w/Dr Allyson Sabal R moderate L mild Check arterial Doppler to rule out peripheral arterial disease ASA 162 mg/d

## 2023-03-13 NOTE — Assessment & Plan Note (Signed)
Monitor A1c 

## 2023-03-13 NOTE — Assessment & Plan Note (Signed)
 F/u w/Dr Allyson Sabal Cont on Losartan, Lovastatin, ASA

## 2023-03-14 ENCOUNTER — Ambulatory Visit: Payer: Medicare HMO | Admitting: Podiatry

## 2023-03-23 NOTE — Progress Notes (Signed)
Patient ID: Johnny [name], male   DOB: 1945/03/15, 78 y.o.   MRN: [id number]  Reason for Consult: Follow-up   Referred by Plotnikov, Georgina Quint, MD  Subjective:     HPI  Johnny [name] is a 78 y.o. male presenting for follow-up of claudication.  I saw him about 3 months ago and he was referred to cardiology for clearance for Pletal as he has a diagnosis of CHF although did not have an updated echo.  Dr. Gery Pray cleared him for a trial of Pletal but unfortunately he was intolerant due to shortness of breath. 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  He continues to deny rest pain or ulceration.  Past Medical History:  Diagnosis Date   Arthritis    At risk for sleep apnea    STOP-BANG=  6   SENT TO PCP 05-21-2013   Benign positional vertigo    Benign prostatic hypertrophy    Blood transfusion without reported diagnosis 2000   CAD (coronary artery disease)    CARDIOLOGIST--  DR Lewayne Bunting   CHF (congestive heart failure) (HCC)    Chronic ischemic heart disease, unspecified    ED (erectile dysfunction) of organic origin    GERD (gastroesophageal reflux disease)    History of CHF (congestive heart failure)    systolic   HTN (hypertension)    Hyperlipidemia    Tubular adenoma of colon 12/2009   Type 2 diabetes mellitus (HCC)    Wears glasses    Family History  Problem Relation Age of Onset   Hypertension Mother    Diabetes Father    Cancer Sister 66       ovarian ca   Cancer - Other Brother        cancer all over   Coronary artery disease Other        1st degree male relative   Hypertension Other    Colon cancer Neg Hx    Stomach cancer Neg Hx    Esophageal cancer Neg Hx    Pancreatic cancer Neg Hx    Liver disease Neg Hx    Colon polyps Neg Hx    Rectal cancer Neg Hx    Past Surgical History:  Procedure Laterality Date   CARDIAC  CATHETERIZATION  03-03-2000  dr Sharlot Gowda taylor   normal lvsf/  two-vessel cad significant complex stenosis at distal left main   CARDIOVASCULAR STRESS TEST  08-09-2010  DR GREGG TAYLOR   normal lexiscan nuclear study/  no ischemia/  normal lvf/  ef 54%   CATARACT EXTRACTION W/ INTRAOCULAR LENS IMPLANT Right 03/07/2009   COLONOSCOPY  10/09/2020   12/30/2009   CORONARY ARTERY BYPASS GRAFT  03-06-2000   DR GERHARDT   third vessel   PENILE PROSTHESIS IMPLANT  02/08/2010   COLOPLAST 3-PIECE INFLATABLE   PENILE PROSTHESIS IMPLANT N/A 05/27/2013   Procedure: CYSTO REMOVAL OF PENILE PROSTHESIS;  Surgeon: Garnett Farm, MD;  Location: [facility];  Service: Urology;  Laterality: N/A;   POLYPECTOMY  12/30/2009   +TA   TOTAL HIP ARTHROPLASTY  01/24/2011   Procedure: TOTAL HIP ARTHROPLASTY;  Surgeon: Gus Rankin [name];  Location: [facility];  Service: Orthopedics;  Laterality: Left;   TOTAL HIP ARTHROPLASTY Right 01/30/2013   Procedure: RIGHT TOTAL HIP ARTHROPLASTY;  Surgeon: Loanne Drilling, MD;  Location: [facility];  Service:  Orthopedics;  Laterality: Right;    Short Social History:  Social History   Tobacco Use   Smoking status: Former    Current packs/day: 0.00    Types: Cigarettes    Quit date: 05/22/1970    Years since quitting: 52.8   Smokeless tobacco: Never  Substance Use Topics   Alcohol use: No    Comment: seldom    Allergies  Allergen Reactions   Enalapril     SOB   Hydrocodone-Acetaminophen Nausea Only    Can take codein    Current Outpatient Medications  Medication Sig Dispense Refill   Accu-Chek Softclix Lancets lancets Use as instructed to check blood sugars 1 times a day, fasting 100 each 12   albuterol (VENTOLIN HFA) 108 (90 Base) MCG/ACT inhaler INHALE 2 PUFFS INTO THE LUNGS EVERY 4 HOURS AS NEEDED FOR WHEEZING OR SHORTNESS OF BREATH 18 g 5   Alcohol Swabs (DROPSAFE ALCOHOL PREP) 70 % PADS USE AS DIRECTED 100 each 3   allopurinol (ZYLOPRIM) 100 MG tablet  TAKE 1 TABLET EVERY DAY 90 tablet 3   aspirin 81 MG EC tablet Take 2 tablets (162 mg total) by mouth daily. Swallow whole. 100 tablet 3   Blood Glucose Calibration (ACCU-CHEK AVIVA) SOLN Use as directed as needed. 1 each 2   Blood Glucose Monitoring Suppl (ACCU-CHEK GUIDE ME) w/Device KIT Use as instructed to check blood sugars 1 times a day, fasting 1 kit 0   cholecalciferol (VITAMIN D) 1000 UNITS tablet Take 1,000 Units by mouth every morning.     cilostazol (PLETAL) 50 MG tablet Take 1 tablet (50 mg total) by mouth 2 (two) times daily. 180 tablet 2   colchicine 0.6 MG tablet Take 1 tablet (0.6 mg total) by mouth daily. 90 tablet 3   dapagliflozin propanediol (FARXIGA) 10 MG TABS tablet Take 1 tablet (10 mg total) by mouth daily before breakfast. 30 tablet 5   empagliflozin (JARDIANCE) 25 MG TABS tablet Take 1 tablet (25 mg total) by mouth daily before breakfast. 90 tablet 3   finasteride (PROSCAR) 5 MG tablet TAKE 1 TABLET EVERY MORNING 90 tablet 3   fluticasone furoate-vilanterol (BREO ELLIPTA) 100-25 MCG/INH AEPB Inhale 1 puff into the lungs daily. 1 each 5   glipiZIDE (GLUCOTROL) 5 MG tablet TAKE 1 TABLET BY MOUTH TWICE DAILY BEFORE A MEAL 180 tablet 3   glucose blood (ACCU-CHEK GUIDE) test strip Use as instructed, check blood sugars 1 times a day, fasting 100 each 12   irbesartan-hydrochlorothiazide (AVALIDE) 300-12.5 MG tablet TAKE 1 TABLET EVERY DAY 90 tablet 3   Lancet Devices (ACCU-CHEK SOFTCLIX) lancets Use twice daily as instructed. Dx: 250.00 1 each 3   Lancets (ONETOUCH DELICA PLUS LANCET33G) MISC USE TWICE DAILY AS DIRECTED 200 each 3   lovastatin (MEVACOR) 40 MG tablet TAKE 1 TABLET EVERY MORNING 90 tablet 3   Multiple Vitamins-Minerals (MULTI-VITAMIN GUMMIES PO) Take 1 tablet by mouth daily.     nitroGLYCERIN (NITROSTAT) 0.4 MG SL tablet Place 1 tablet (0.4 mg total) under the tongue every 5 (five) minutes as needed for chest pain. 25 tablet 3   ONETOUCH ULTRA test strip USE  TWICE DAILY AS  INSTRUCTED 200 strip 3   pantoprazole (PROTONIX) 40 MG tablet TAKE 1 TABLET EVERY MORNING 90 tablet 3   Semaglutide (RYBELSUS) 7 MG TABS Take 1 tablet (7 mg total) by mouth daily.     sildenafil (VIAGRA) 100 MG tablet Take 0.5-1 tablets (50-100 mg total) by mouth daily as needed  for erectile dysfunction. 12 tablet 11   tamsulosin (FLOMAX) 0.4 MG CAPS capsule TAKE 1 CAPSULE EVERY DAY 90 capsule 3   No current facility-administered medications for this visit.    REVIEW OF SYSTEMS   All other systems were reviewed and are negative     Objective:  Objective   Vitals:   03/24/23 1203  BP: 134/62  Pulse: 65  Resp: 20  Temp: 97.8 F (36.6 C)  SpO2: 98%  Weight: 223 lb (101.2 kg)  Height: 5' 10.5" (1.791 m)   Body mass index is 31.54 kg/m.  Physical Exam General: no acute distress Cardiac: hemodynamically stable, nontachycardic Pulm: normal work of breathing Neuro: alert, no focal deficit Extremities: no edema, cyanosis or wounds   Data: +---------+------------------+-----+-------------------+--------+  Right   Rt Pressure (mmHg)IndexWaveform           Comment   +---------+------------------+-----+-------------------+--------+  Brachial 175                                                 +---------+------------------+-----+-------------------+--------+  PTA     131               0.70 monophasic                   +---------+------------------+-----+-------------------+--------+  DP      116               0.62 dampened monophasic          +---------+------------------+-----+-------------------+--------+  Great Toe64                0.34 Abnormal                     +---------+------------------+-----+-------------------+--------+   +---------+------------------+-----+-------------------+---------+  Left    Lt Pressure (mmHg)IndexWaveform           Comment     +---------+------------------+-----+-------------------+---------+  Brachial 186                                                  +---------+------------------+-----+-------------------+---------+  PTA     0                 0.00 absent             inaudible  +---------+------------------+-----+-------------------+---------+  DP      142               0.76 dampened monophasic           +---------+------------------+-----+-------------------+---------+  Great Toe55                0.30 Abnormal                      +---------+------------------+-----+-------------------+---------+   Most recent labs reviewed, creatinine 2.26  Bilateral lower extremity arterial duplex reviewed Right mid SFA occlusion with reconstitution in the distal SFA, runoff via the PT and peroneal Left proximal SFA stenosis, runoff via the AT and peroneal     Assessment/Plan:     CAMDYNN ALDIS is a 78 y.o. male with PAD and claudication.  He has a history of CHF although he was cleared by cardiology for trial of Pletal but unfortunately  he was intolerant with shortness of breath. 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  I explained that CO2 angiography and endovascular revascularization would improve his symptoms in the short-term but would risk worsening in the long-term. Explained that he should not retry Pletal as he was intolerant due to his heart condition.  Encouraged to continue walking and exercise Continue 81 mg aspirin and statin Plan for follow-up with repeat ABI in 6 months, and instructed to call for earlier report should his symptoms worsen    Recommendations to optimize cardiovascular risk: Abstinence from all tobacco products. Blood glucose control with goal A1c < 7%. Blood pressure control with goal blood pressure <  140/90 mmHg. Lipid reduction therapy with goal LDL-C <100 mg/dL  Aspirin 81mg  PO QD.  Atorvastatin 40-80mg  PO QD (or other "high intensity" statin therapy).     Daria Pastures MD [facility]

## 2023-03-24 ENCOUNTER — Ambulatory Visit: Payer: Medicare (Managed Care) | Admitting: Vascular Surgery

## 2023-03-24 ENCOUNTER — Encounter: Payer: Self-pay | Admitting: Vascular Surgery

## 2023-03-24 VITALS — BP 134/62 | HR 65 | Temp 97.8°F | Resp 20 | Ht 70.5 in | Wt 223.0 lb

## 2023-03-24 DIAGNOSIS — I5030 Unspecified diastolic (congestive) heart failure: Secondary | ICD-10-CM

## 2023-03-24 DIAGNOSIS — N184 Chronic kidney disease, stage 4 (severe): Secondary | ICD-10-CM | POA: Diagnosis not present

## 2023-03-24 DIAGNOSIS — I739 Peripheral vascular disease, unspecified: Secondary | ICD-10-CM | POA: Diagnosis not present

## 2023-03-29 ENCOUNTER — Ambulatory Visit: Payer: Medicare (Managed Care) | Admitting: Internal Medicine

## 2023-03-29 ENCOUNTER — Other Ambulatory Visit: Payer: Self-pay | Admitting: Internal Medicine

## 2023-03-29 ENCOUNTER — Encounter: Payer: Self-pay | Admitting: Internal Medicine

## 2023-03-29 VITALS — BP 124/60 | HR 68 | Resp 16 | Ht 70.5 in | Wt 223.0 lb

## 2023-03-29 DIAGNOSIS — E1122 Type 2 diabetes mellitus with diabetic chronic kidney disease: Secondary | ICD-10-CM | POA: Diagnosis not present

## 2023-03-29 DIAGNOSIS — N1832 Chronic kidney disease, stage 3b: Secondary | ICD-10-CM

## 2023-03-29 DIAGNOSIS — Z7984 Long term (current) use of oral hypoglycemic drugs: Secondary | ICD-10-CM

## 2023-03-29 DIAGNOSIS — E1159 Type 2 diabetes mellitus with other circulatory complications: Secondary | ICD-10-CM | POA: Diagnosis not present

## 2023-03-29 DIAGNOSIS — E1142 Type 2 diabetes mellitus with diabetic polyneuropathy: Secondary | ICD-10-CM | POA: Diagnosis not present

## 2023-03-29 LAB — POCT GLYCOSYLATED HEMOGLOBIN (HGB A1C): Hemoglobin A1C: 7.6 % — AB (ref 4.0–5.6)

## 2023-03-29 MED ORDER — EMPAGLIFLOZIN 25 MG PO TABS: 25.0000 mg | ORAL_TABLET | Freq: Every day | ORAL | 3 refills | Status: DC

## 2023-03-29 MED ORDER — GLIPIZIDE 5 MG PO TABS: ORAL_TABLET | ORAL | 3 refills | Status: DC

## 2023-03-29 NOTE — Progress Notes (Signed)
Name: Johnny [name]  Age/ Sex: 78 y.o., male   MRN/ DOB: [id number], August 08, 1945     PCP: Tresa Garter, MD   Reason for Endocrinology Evaluation: Type 2 Diabetes Mellitus  Initial Endocrine Consultative Visit: 09/15/2016    PATIENT IDENTIFIER: Johnny [name] is a 78 y.o. male with a past medical history of T2DM, CAD, HTN and dyslipidemia . The patient has followed with Endocrinology clinic since 09/15/2016 for consultative assistance with management of his diabetes.  DIABETIC HISTORY:  [name] was diagnosed with DM 1999, has been on oral glycemic agents since his dx . His hemoglobin A1c has ranged from 6.9% in 2021, peaking at 9.3% in 2022.  Transitioned from Dr. Everardo All 08/2021   On his initial visit with me in June 2023 ,he had an A1c of 7.0%,   he was on metformin, repaglinide, bromocriptine, and Rybelsus.  Patient was not taking repaglinide  (unknown reason ), we stopped bromocriptine,  increased Rybelsus, but the patient contacted the office stating that it is cost prohibitive.  So we started glipizide in June 2023  Stopped metformin 07/2022 due to low GFR  I attempted to prescribe Rybelsus 2024 but this was cost prohibitive  SUBJECTIVE:   During the last visit (11/14/2022): A1c 8.1%    Today (03/29/2023): [name] is here for a follow up on diabetes management.  He is accompanied by his spouse today.  He checks his blood sugars 1 times daily.    Patient continues to follow-up with vascular surgery for peripheral artery disease and claudication.  On medical management He was seen by podiatry 03/07/2023 Patient follows with cardiology, limited treatment options due to CKD He also established care with nephrology Denies nausea, vomiting  Denies diarrhea but has occasional constipation  resolved with laxative     HOME DIABETES REGIMEN:  Glipizide 5 mg twice daily Jardiance 25 mg daily       Statin: yes ACE-I/ARB: yes    METER DOWNLOAD SUMMARY:  unable to download   DIABETIC COMPLICATIONS: Microvascular complications:  CKD III, neuropathy Denies: retinopathy Last Eye Exam: Completed 04/08/2022  Macrovascular complications:  CAD ( S/P CABG ) Denies:  CVA, PVD   HISTORY:  Past Medical History:  Past Medical History:  Diagnosis Date   Arthritis    At risk for sleep apnea    STOP-BANG=  6   SENT TO PCP 05-21-2013   Benign positional vertigo    Benign prostatic hypertrophy    Blood transfusion without reported diagnosis 2000   CAD (coronary artery disease)    CARDIOLOGIST--  DR Lewayne Bunting   CHF (congestive heart failure) (HCC)    Chronic ischemic heart disease, unspecified    ED (erectile dysfunction) of organic origin    GERD (gastroesophageal reflux disease)    History of CHF (congestive heart failure)    systolic   HTN (hypertension)    Hyperlipidemia    Tubular adenoma of colon 12/2009   Type 2 diabetes mellitus (HCC)    Wears glasses    Past Surgical History:  Past Surgical History:  Procedure Laterality Date   CARDIAC CATHETERIZATION  03-03-2000  dr Sharlot Gowda taylor   normal lvsf/  two-vessel cad significant complex stenosis at distal left main   CARDIOVASCULAR STRESS TEST  08-09-2010  DR Sharlot Gowda TAYLOR   normal lexiscan nuclear study/  no ischemia/  normal lvf/  ef 54%   CATARACT EXTRACTION W/ INTRAOCULAR LENS IMPLANT Right 03/07/2009   COLONOSCOPY  10/09/2020   12/30/2009  CORONARY ARTERY BYPASS GRAFT  03-06-2000   DR GERHARDT   third vessel   PENILE PROSTHESIS IMPLANT  02/08/2010   COLOPLAST 3-PIECE INFLATABLE   PENILE PROSTHESIS IMPLANT N/A 05/27/2013   Procedure: CYSTO REMOVAL OF PENILE PROSTHESIS;  Surgeon: Garnett Farm, MD;  Location: [facility];  Service: Urology;  Laterality: N/A;   POLYPECTOMY  12/30/2009   +TA   TOTAL HIP ARTHROPLASTY  01/24/2011   Procedure: TOTAL HIP ARTHROPLASTY;  Surgeon: Gus Rankin [name];  Location: [facility];  Service: Orthopedics;  Laterality: Left;   TOTAL  HIP ARTHROPLASTY Right 01/30/2013   Procedure: RIGHT TOTAL HIP ARTHROPLASTY;  Surgeon: Loanne Drilling, MD;  Location: [facility];  Service: Orthopedics;  Laterality: Right;   Social History:  reports that he quit smoking about 52 years ago. His smoking use included cigarettes. He has never used smokeless tobacco. He reports that he does not drink alcohol and does not use drugs. Family History:  Family History  Problem Relation Age of Onset   Hypertension Mother    Diabetes Father    Cancer Sister 18       ovarian ca   Cancer - Other Brother        cancer all over   Coronary artery disease Other        1st degree male relative   Hypertension Other    Colon cancer Neg Hx    Stomach cancer Neg Hx    Esophageal cancer Neg Hx    Pancreatic cancer Neg Hx    Liver disease Neg Hx    Colon polyps Neg Hx    Rectal cancer Neg Hx      HOME MEDICATIONS: Allergies as of 03/29/2023       Reactions   Enalapril    SOB   Hydrocodone-acetaminophen Nausea Only   Can take codein        Medication List        Accurate as of March 29, 2023  1:33 PM. If you have any questions, ask your nurse or doctor.          Accu-Chek Aviva Soln Use as directed as needed.   Accu-Chek Guide Me w/Device Kit Use as instructed to check blood sugars 1 times a day, fasting   accu-chek softclix lancets Use twice daily as instructed. Dx: 250.00   albuterol 108 (90 Base) MCG/ACT inhaler Commonly known as: Ventolin HFA INHALE 2 PUFFS INTO THE LUNGS EVERY 4 HOURS AS NEEDED FOR WHEEZING OR SHORTNESS OF BREATH   allopurinol 100 MG tablet Commonly known as: ZYLOPRIM TAKE 1 TABLET EVERY DAY   aspirin EC 81 MG tablet Take 2 tablets (162 mg total) by mouth daily. Swallow whole.   cholecalciferol 1000 units tablet Commonly known as: VITAMIN D Take 1,000 Units by mouth every morning.   cilostazol 50 MG tablet Commonly known as: PLETAL Take 1 tablet (50 mg total) by mouth 2 (two) times daily.    colchicine 0.6 MG tablet Take 1 tablet (0.6 mg total) by mouth daily.   dapagliflozin propanediol 10 MG Tabs tablet Commonly known as: Farxiga Take 1 tablet (10 mg total) by mouth daily before breakfast.   DropSafe Alcohol Prep 70 % Pads USE AS DIRECTED   empagliflozin 25 MG Tabs tablet Commonly known as: Jardiance Take 1 tablet (25 mg total) by mouth daily before breakfast.   finasteride 5 MG tablet Commonly known as: PROSCAR TAKE 1 TABLET EVERY MORNING   fluticasone furoate-vilanterol 100-25 MCG/INH Aepb Commonly  known as: Breo Ellipta Inhale 1 puff into the lungs daily.   glipiZIDE 5 MG tablet Commonly known as: GLUCOTROL TAKE 1 TABLET BY MOUTH TWICE DAILY BEFORE A MEAL   irbesartan-hydrochlorothiazide 300-12.5 MG tablet Commonly known as: AVALIDE TAKE 1 TABLET EVERY DAY   lovastatin 40 MG tablet Commonly known as: MEVACOR TAKE 1 TABLET EVERY MORNING   MULTI-VITAMIN GUMMIES PO Take 1 tablet by mouth daily.   nitroGLYCERIN 0.4 MG SL tablet Commonly known as: NITROSTAT Place 1 tablet (0.4 mg total) under the tongue every 5 (five) minutes as needed for chest pain.   OneTouch Delica Plus Lancet33G Misc USE TWICE DAILY AS DIRECTED   Accu-Chek Softclix Lancets lancets Use as instructed to check blood sugars 1 times a day, fasting   OneTouch Ultra test strip Generic drug: glucose blood USE TWICE DAILY AS  INSTRUCTED   Accu-Chek Guide test strip Generic drug: glucose blood Use as instructed, check blood sugars 1 times a day, fasting   pantoprazole 40 MG tablet Commonly known as: PROTONIX TAKE 1 TABLET EVERY MORNING   Rybelsus 7 MG Tabs Generic drug: Semaglutide Take 1 tablet (7 mg total) by mouth daily.   sildenafil 100 MG tablet Commonly known as: Viagra Take 0.5-1 tablets (50-100 mg total) by mouth daily as needed for erectile dysfunction.   tamsulosin 0.4 MG Caps capsule Commonly known as: FLOMAX TAKE 1 CAPSULE EVERY DAY         OBJECTIVE:    Vital Signs: BP 124/60   Pulse 68   Resp 16   Ht 5' 10.5" (1.791 m)   Wt 223 lb (101.2 kg)   SpO2 98%   BMI 31.54 kg/m   Wt Readings from Last 3 Encounters:  03/29/23 223 lb (101.2 kg)  03/24/23 223 lb (101.2 kg)  03/13/23 220 lb (99.8 kg)     Exam: General: Pt appears well and is in NAD  Lungs: Clear with good BS bilat  Heart: RRR   Extremities: No pretibial edema   Neuro: MS is good with appropriate affect, pt is alert and Ox3    DM foot exam: 03/07/2023 per podiatry   DATA REVIEWED:  Lab Results  Component Value Date   HGBA1C 7.7 (H) 12/08/2022   HGBA1C 8.1 (A) 11/14/2022   HGBA1C 7.6 (H) 09/06/2022    Latest Reference Range & Units 12/08/22 15:17  Sodium 135 - 145 mEq/L 136  Potassium 3.5 - 5.1 mEq/L 4.6  Chloride 96 - 112 mEq/L 104  CO2 19 - 32 mEq/L 28  Glucose 70 - 99 mg/dL 62 (L)  BUN 6 - 23 mg/dL 22  Creatinine 9.52 - 8.41 mg/dL 3.24 (H)  Calcium 8.4 - 10.5 mg/dL 9.3  Alkaline Phosphatase 39 - 117 U/L 78  Albumin 3.5 - 5.2 g/dL 4.0  AST 0 - 37 U/L 15  ALT 0 - 53 U/L 11  Total Protein 6.0 - 8.3 g/dL 7.2  Total Bilirubin 0.2 - 1.2 mg/dL 0.5  GFR >40.10 mL/min 27.17 (L)     ASSESSMENT / PLAN / RECOMMENDATIONS:   1) Type 2 Diabetes Mellitus, Sub-Optimally  controlled, With neuropathic, CKD III  and macrovascular  complications - Most recent A1c of 7.6%. Goal A1c < 7.0 %.    -A1c trending down -We had to stop metformin due to a GFR <35 -Rybelsus is cost prohibitive -Will increase glipizide as below  MEDICATIONS:  Continue Jardiance 25 mg daily Increase glipizide 5 mg, 2 tabs before Breakfast and 1 tab Before Supper  EDUCATION / INSTRUCTIONS: BG monitoring instructions: Patient is instructed to check his blood sugars 1 times a day, fasting. Call [place] [facility] if: BG persistently < 70  I reviewed the Rule of 15 for the treatment of hypoglycemia in detail with the patient. Literature supplied.   2) Diabetic complications:   Eye: Does not have known diabetic retinopathy.  Neuro/ Feet: Does  have known diabetic peripheral neuropathy .  Renal: Patient does have known baseline CKD. He   is  on an ACEI/ARB at present.    3)CKD III:  -GFR has been fluctuating -Patient on ARB - Follows with Nephrology   F/U in 4 months  I spent 25 minutes preparing to see the patient by review of recent labs, imaging and procedures, obtaining and reviewing separately obtained history, communicating with the patient/family or caregiver, ordering medications, tests or procedures, and documenting clinical information in the EHR including the differential Dx, treatment, and any further evaluation and other management    Signed electronically by: Lyndle Herrlich, MD  [facility] End[facility]  [facility] [address] [place] [place], [place] [address] Phone: [phone] FAX: [phone]   CC: Tresa Garter, MD [address] [place] [address] Phone: [phone]  Fax: [phone]  Return to Endocrinology clinic as below: Future Appointments  Date Time Provider Department Center  03/31/2023 11:30 AM [facility] [place] 2 [id number]  05/16/2023  3:50 PM [facility]-ANNUAL WELLNESS VISIT [facility] None  06/05/2023  2:15 PM Helane Gunther, DPM [facility]  06/20/2023  1:40 PM Plotnikov, Georgina Quint, MD [facility] None  08/22/2023  2:00 PM Runell Gess, MD [facility] None  09/22/2023  2:00 PM [facility] VASC 1 [facility]  09/22/2023  2:40 PM Daria Pastures, MD [facility]

## 2023-03-29 NOTE — Patient Instructions (Addendum)
A1c 7.6 % , Keep up the Good work    Glipizide 5 mg , 2 tablet before Breakfast and 1 tablet before Supper Jardiance 25 mg , 1 tablet before Breakfast      HOW TO TREAT LOW BLOOD SUGARS (Blood sugar LESS THAN 70 MG/DL) Please follow the RULE OF 15 for the treatment of hypoglycemia treatment (when your (blood sugars are less than 70 mg/dL)   STEP 1: Take 15 grams of carbohydrates when your blood sugar is low, which includes:  3-4 GLUCOSE TABS  OR 3-4 OZ OF JUICE OR REGULAR SODA OR ONE TUBE OF GLUCOSE GEL    STEP 2: RECHECK blood sugar in 15 MINUTES STEP 3: If your blood sugar is still low at the 15 minute recheck --> then, go back to STEP 1 and treat AGAIN with another 15 grams of carbohydrates.

## 2023-03-30 ENCOUNTER — Other Ambulatory Visit: Payer: Medicare (Managed Care)

## 2023-03-31 ENCOUNTER — Ambulatory Visit
Admission: RE | Admit: 2023-03-31 | Discharge: 2023-03-31 | Disposition: A | Discharge status: Home / Self Care | Payer: Medicare (Managed Care) | Source: Ambulatory Visit | Attending: Internal Medicine | Admitting: Internal Medicine

## 2023-03-31 DIAGNOSIS — N1832 Chronic kidney disease, stage 3b: Secondary | ICD-10-CM

## 2023-04-12 ENCOUNTER — Ambulatory Visit: Payer: Medicare HMO | Admitting: Cardiovascular Disease

## 2023-04-18 ENCOUNTER — Other Ambulatory Visit: Payer: Self-pay

## 2023-04-18 DIAGNOSIS — I739 Peripheral vascular disease, unspecified: Secondary | ICD-10-CM

## 2023-05-16 ENCOUNTER — Telehealth: Payer: Self-pay | Admitting: Internal Medicine

## 2023-05-16 ENCOUNTER — Ambulatory Visit (INDEPENDENT_AMBULATORY_CARE_PROVIDER_SITE_OTHER): Payer: Medicare HMO

## 2023-05-16 VITALS — BP 138/80 | Ht 70.0 in | Wt 222.4 lb

## 2023-05-16 DIAGNOSIS — Z87891 Personal history of nicotine dependence: Secondary | ICD-10-CM | POA: Diagnosis not present

## 2023-05-16 DIAGNOSIS — Z122 Encounter for screening for malignant neoplasm of respiratory organs: Secondary | ICD-10-CM

## 2023-05-16 DIAGNOSIS — Z0000 Encounter for general adult medical examination without abnormal findings: Secondary | ICD-10-CM | POA: Diagnosis not present

## 2023-05-16 NOTE — Progress Notes (Cosign Needed Addendum)
 Subjective:   Johnny [name] is a 78 y.o. who presents for a Medicare Wellness preventive visit.  Visit Complete: In person  AWV Questionnaire: No: Patient Medicare AWV questionnaire was not completed prior to this visit.  Cardiac Risk Factors include: advanced age (>70men, >68 women);diabetes mellitus;hypertension;dyslipidemia;male gender;obesity (BMI >30kg/m2)     Objective:    Today's Vitals   05/16/23 1550  BP: 138/80  Weight: 222 lb 6.4 oz (100.9 kg)  Height: 5\' 10"  (1.778 m)   Body mass index is 31.91 kg/m.     05/16/2023    3:49 PM 05/16/2022    4:59 PM 05/03/2021    2:38 PM 02/18/2020    9:45 AM 12/17/2019    4:29 PM 10/13/2016    7:39 AM 12/25/2014   12:57 PM  Advanced Directives  Does Patient Have a Medical Advance Directive? Yes No No No No No No  Type of Estate agent of [place];Living will        Copy of Healthcare Power of Attorney in Chart? No - copy requested        Would patient like information on creating a medical advance directive?  Yes (MAU/Ambulatory/Procedural Areas - Information given) No - Patient declined No - Patient declined No - Patient declined No - Patient declined     Current Medications (verified) Outpatient Encounter Medications as of 05/16/2023  Medication Sig   Accu-Chek Softclix Lancets lancets Use as instructed to check blood sugars 1 times a day, fasting   albuterol (VENTOLIN HFA) 108 (90 Base) MCG/ACT inhaler INHALE 2 PUFFS INTO THE LUNGS EVERY 4 HOURS AS NEEDED FOR WHEEZING OR SHORTNESS OF BREATH   Alcohol Swabs (DROPSAFE ALCOHOL PREP) 70 % PADS USE AS DIRECTED   allopurinol (ZYLOPRIM) 100 MG tablet TAKE 1 TABLET EVERY DAY   aspirin 81 MG EC tablet Take 2 tablets (162 mg total) by mouth daily. Swallow whole.   Blood Glucose Calibration (ACCU-CHEK AVIVA) SOLN Use as directed as needed.   Blood Glucose Monitoring Suppl (ACCU-CHEK GUIDE ME) w/Device KIT Use as instructed to check blood sugars 1 times a day,  fasting   cholecalciferol (VITAMIN D) 1000 UNITS tablet Take 1,000 Units by mouth every morning.   cilostazol (PLETAL) 50 MG tablet Take 1 tablet (50 mg total) by mouth 2 (two) times daily.   colchicine 0.6 MG tablet Take 1 tablet (0.6 mg total) by mouth daily.   empagliflozin (JARDIANCE) 25 MG TABS tablet Take 1 tablet (25 mg total) by mouth daily before breakfast.   finasteride (PROSCAR) 5 MG tablet TAKE 1 TABLET EVERY MORNING   fluticasone furoate-vilanterol (BREO ELLIPTA) 100-25 MCG/INH AEPB Inhale 1 puff into the lungs daily.   glipiZIDE (GLUCOTROL) 5 MG tablet Take 2 tablets (10 mg total) by mouth daily before breakfast AND 1 tablet (5 mg total) daily before supper.   glucose blood (ACCU-CHEK GUIDE) test strip Use as instructed, check blood sugars 1 times a day, fasting   irbesartan-hydrochlorothiazide (AVALIDE) 300-12.5 MG tablet TAKE 1 TABLET EVERY DAY   Lancet Devices (ACCU-CHEK SOFTCLIX) lancets Use twice daily as instructed. Dx: 250.00   Lancets (ONETOUCH DELICA PLUS LANCET33G) MISC USE TWICE DAILY AS DIRECTED   lovastatin (MEVACOR) 40 MG tablet TAKE 1 TABLET EVERY MORNING   Multiple Vitamins-Minerals (MULTI-VITAMIN GUMMIES PO) Take 1 tablet by mouth daily.   nitroGLYCERIN (NITROSTAT) 0.4 MG SL tablet Place 1 tablet (0.4 mg total) under the tongue every 5 (five) minutes as needed for chest pain.   ONETOUCH  ULTRA test strip USE TWICE DAILY AS  INSTRUCTED   pantoprazole (PROTONIX) 40 MG tablet TAKE 1 TABLET EVERY MORNING   sildenafil (VIAGRA) 100 MG tablet Take 0.5-1 tablets (50-100 mg total) by mouth daily as needed for erectile dysfunction.   tamsulosin (FLOMAX) 0.4 MG CAPS capsule TAKE 1 CAPSULE EVERY DAY   No facility-administered encounter medications on file as of 05/16/2023.    Allergies (verified) Enalapril and Hydrocodone-acetaminophen   History: Past Medical History:  Diagnosis Date   Arthritis    At risk for sleep apnea    STOP-BANG=  6   SENT TO PCP 05-21-2013    Benign positional vertigo    Benign prostatic hypertrophy    Blood transfusion without reported diagnosis 2000   CAD (coronary artery disease)    CARDIOLOGIST--  DR Lewayne Bunting   CHF (congestive heart failure) (HCC)    Chronic ischemic heart disease, unspecified    ED (erectile dysfunction) of organic origin    GERD (gastroesophageal reflux disease)    History of CHF (congestive heart failure)    systolic   HTN (hypertension)    Hyperlipidemia    Tubular adenoma of colon 12/2009   Type 2 diabetes mellitus (HCC)    Wears glasses    Past Surgical History:  Procedure Laterality Date   CARDIAC CATHETERIZATION  03-03-2000  dr Sharlot Gowda taylor   normal lvsf/  two-vessel cad significant complex stenosis at distal left main   CARDIOVASCULAR STRESS TEST  08-09-2010  DR GREGG TAYLOR   normal lexiscan nuclear study/  no ischemia/  normal lvf/  ef 54%   CATARACT EXTRACTION W/ INTRAOCULAR LENS IMPLANT Right 03/07/2009   COLONOSCOPY  10/09/2020   12/30/2009   CORONARY ARTERY BYPASS GRAFT  03-06-2000   DR GERHARDT   third vessel   PENILE PROSTHESIS IMPLANT  02/08/2010   COLOPLAST 3-PIECE INFLATABLE   PENILE PROSTHESIS IMPLANT N/A 05/27/2013   Procedure: CYSTO REMOVAL OF PENILE PROSTHESIS;  Surgeon: Garnett Farm, MD;  Location: [facility];  Service: Urology;  Laterality: N/A;   POLYPECTOMY  12/30/2009   +TA   TOTAL HIP ARTHROPLASTY  01/24/2011   Procedure: TOTAL HIP ARTHROPLASTY;  Surgeon: Gus Rankin [name];  Location: [facility];  Service: Orthopedics;  Laterality: Left;   TOTAL HIP ARTHROPLASTY Right 01/30/2013   Procedure: RIGHT TOTAL HIP ARTHROPLASTY;  Surgeon: Loanne Drilling, MD;  Location: [facility];  Service: Orthopedics;  Laterality: Right;   Family History  Problem Relation Age of Onset   Hypertension Mother    Diabetes Father    Cancer Sister 27       ovarian ca   Cancer - Other Brother        cancer all over   Coronary artery disease Other        1st degree male  relative   Hypertension Other    Colon cancer Neg Hx    Stomach cancer Neg Hx    Esophageal cancer Neg Hx    Pancreatic cancer Neg Hx    Liver disease Neg Hx    Colon polyps Neg Hx    Rectal cancer Neg Hx    Social History   Socioeconomic History   Marital status: Married    Spouse name: Not on file   Number of children: 2   Years of education: Not on file   Highest education level: Not on file  Occupational History   Occupation: Programmer, multimedia    Employer: RETIRED  Tobacco Use   Smoking  status: Former    Current packs/day: 0.00    Average packs/day: 1 pack/day for 22.2 years (22.2 ttl pk-yrs)    Types: Cigarettes    Start date: 03/07/1948    Quit date: 05/22/1970    Years since quitting: 53.0    Passive exposure: Past   Smokeless tobacco: Never  Vaping Use   Vaping status: Never Used  Substance and Sexual Activity   Alcohol use: No    Comment: seldom   Drug use: No   Sexual activity: Not Currently  Other Topics Concern   Not on file  Social History Narrative   Regular Exercise-Yes (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)   Married; has 2 daughters   He is a Conservator, museum/gallery   Social Drivers of Corporate investment banker Strain: Low Risk  (05/16/2023)   Overall Financial Resource Strain (CARDIA)    Difficulty of Paying Living Expenses: Not hard at all  Food Insecurity: No Food Insecurity (05/16/2023)   Hunger Vital Sign    Worried About Running Out of Food in the Last Year: Never true    Ran Out of Food in the Last Year: Never true  Transportation Needs: No Transportation Needs (05/16/2023)   PRAPARE - Administrator, Civil Service (Medical): No    Lack of Transportation (Non-Medical): No  Physical Activity: Sufficiently Active (05/16/2023)   Exercise Vital Sign    Days of Exercise per Week: 7 days    Minutes of Exercise per Session: 60 min  Stress: No Stress Concern Present (05/16/2023)   [employer] of Occupational Health - Occupational  Stress Questionnaire    Feeling of Stress : Not at all  Social Connections: Socially Integrated (05/16/2023)   Social Connection and Isolation Panel [NHANES]    Frequency of Communication with Friends and Family: More than three times a week    Frequency of Social Gatherings with Friends and Family: More than three times a week    Attends Religious Services: More than 4 times per year    Active Member of [employer] or Organizations: Yes    Attends Engineer, structural: More than 4 times per year    Marital Status: Married    Tobacco Counseling - Former Smoker Counseling given - Yes Ordered a Lung Cancer Screening test due to smoking >57yrs  Clinical Intake:  Pre-visit preparation completed: Yes  Pain : No/denies pain     BMI - recorded: 31.91 Nutritional Status: BMI > 30  Obese Nutritional Risks: None Diabetes: Yes CBG done?: Yes CBG resulted in Enter/ Edit results?: No (fasting - 160) Did pt. bring in CBG monitor from home?: No  How often do you need to have someone help you when you read instructions, pamphlets, or other written materials from your doctor or pharmacy?: 1 - Never  Interpreter Needed?: No  Information entered by :: Hassell Halim, CMA   Activities of Daily Living     05/16/2023    3:52 PM  In your present state of health, do you have any difficulty performing the following activities:  Hearing? 0  Vision? 0  Difficulty concentrating or making decisions? 0  Walking or climbing stairs? 0  Dressing or bathing? 0  Doing errands, shopping? 0  Preparing Food and eating ? N  Using the Toilet? N  In the past six months, have you accidently leaked urine? N  Do you have problems with loss of bowel control? N  Managing your Medications?  N  Managing your Finances? N  Housekeeping or managing your Housekeeping? N    Patient Care Team: Plotnikov, Georgina Quint, MD as PCP - General Allyson Sabal Delton See, MD as PCP - Cardiology (Cardiology) Ihor Gully, MD  (Inactive) (Urology) Ollen Gross, MD (Orthopedic Surgery) Marinus Maw, MD (Cardiology) Bradly Bienenstock, MD as Consulting Physician (Orthopedic Surgery) Nelson Chimes, MD as Consulting Physician (Ophthalmology) [facility], Konrad Dolores, MD as Attending Physician (Endocrinology)  Indicate any recent Medical Services you may have received from other than [facility] providers in the past year (date may be approximate).     Assessment:   This is a routine wellness examination for Johnny [name].  Hearing/Vision screen Hearing Screening - Comments:: Denies hearing difficulties   Vision Screening - Comments:: Wears rx glasses - up to date with routine eye exams with [facility]   Goals Addressed               This Visit's Progress     Patient Stated (pt-stated)        Patient stated he plans to continue being active.       Depression Screen     05/16/2023    3:58 PM 03/13/2023   11:24 AM 09/06/2022    1:40 PM 06/07/2022    2:17 PM 05/16/2022    4:57 PM 05/03/2021    2:39 PM 05/03/2021    2:11 PM  PHQ 2/9 Scores  PHQ - 2 Score 0 0 0 0 0 0 0  PHQ- 9 Score 0   0 0      Fall Risk     05/16/2023    4:00 PM 03/13/2023   11:24 AM 12/08/2022    2:07 PM 09/06/2022    1:40 PM 06/07/2022    2:17 PM  Fall Risk   Falls in the past year? 1 0 0 0 0  Number falls in past yr: 0 0 0 0 0  Injury with Fall? 0 0 0 0 0  Risk for fall due to :  No Fall Risks No Fall Risks No Fall Risks No Fall Risks  Follow up Falls prevention discussed;Falls evaluation completed Falls evaluation completed Falls evaluation completed Falls evaluation completed Falls evaluation completed    MEDICARE RISK AT HOME:  Medicare Risk at Home Any stairs in or around the home?: No If so, are there any without handrails?: No Home free of loose throw rugs in walkways, pet beds, electrical cords, etc?: Yes Adequate lighting in your home to reduce risk of falls?: Yes Life alert?: No Use of a cane, walker or w/c?: No Grab bars  in the bathroom?: Yes Shower chair or bench in shower?: No Elevated toilet seat or a handicapped toilet?: No  TIMED UP AND GO:  Was the test performed?  No  Cognitive Function: 6CIT completed        05/16/2023    4:01 PM 05/16/2022    5:00 PM  6CIT Screen  What Year? 0 points 0 points  What month? 0 points 0 points  What time? 0 points 0 points  Count back from 20 0 points 0 points  Months in reverse 0 points 0 points  Repeat phrase 0 points 0 points  Total Score 0 points 0 points    Immunizations Immunization History  Administered Date(s) Administered   Fluad Quad(high Dose 65+) 01/02/2019, 12/25/2019, 11/24/2020, 12/01/2021   Fluad Trivalent(High Dose 65+) 12/08/2022   Influenza Split 12/16/2011   Influenza Whole 03/02/2007, 12/23/2008, 10/30/2009   Influenza, High  Dose Seasonal PF 01/24/2013, 02/08/2016, 12/16/2016, 01/25/2018   Influenza,inj,Quad PF,6+ Mos 11/29/2013, 12/02/2014   Moderna SARS-COV2 Booster Vaccination 03/10/2020   PFIZER(Purple Top)SARS-COV-2 Vaccination 04/10/2019, 05/21/2019   Pfizer Covid-19 Vaccine Bivalent Booster 43yrs & up 03/04/2022   Pneumococcal Conjugate-13 06/03/2013   Pneumococcal Polysaccharide-23 10/30/2009, 02/08/2016   Td 03/20/2012   Td (Adult), 2 Lf Tetanus Toxid, Preservative Free 03/20/2012    Screening Tests Health Maintenance  Topic Date Due   Hepatitis C Screening  Never done   DTaP/Tdap/Td (2 - Tdap) 03/20/2022   COVID-19 Vaccine (5 - 2024-25 season) 11/06/2022   OPHTHALMOLOGY EXAM  04/02/2023   Diabetic kidney evaluation - Urine ACR  06/07/2023   FOOT EXAM  08/04/2023   HEMOGLOBIN A1C  09/26/2023   Diabetic kidney evaluation - eGFR measurement  12/08/2023   Medicare Annual Wellness (AWV)  05/15/2024   Pneumonia Vaccine 2+ Years old  Completed   INFLUENZA VACCINE  Completed   HPV VACCINES  Aged Out   Colonoscopy  Discontinued   Zoster Vaccines- Shingrix  Discontinued    Health Maintenance  Health Maintenance  Due  Topic Date Due   Hepatitis C Screening  Never done   DTaP/Tdap/Td (2 - Tdap) 03/20/2022   COVID-19 Vaccine (5 - 2024-25 season) 11/06/2022   OPHTHALMOLOGY EXAM  04/02/2023   Diabetic kidney evaluation - Urine ACR  06/07/2023   Health Maintenance Items Addressed:05/16/2023   Additional Screening:  Vision Screening: Recommended annual ophthalmology exams for early detection of glaucoma and other disorders of the eye. Pt stated that he has his annual eye exam at [facility].  Dental Screening: Recommended annual dental exams for proper oral hygiene  Community Resource Referral / Chronic Care Management: CRR required this visit?  No   CCM required this visit?  No     Plan:     I have personally reviewed and noted the following in the patient's chart:   Medical and social history Use of alcohol, tobacco or illicit drugs  Current medications and supplements including opioid prescriptions. Patient is not currently taking opioid prescriptions. Functional ability and status Nutritional status Physical activity Advanced directives List of other physicians Hospitalizations, surgeries, and ER visits in previous 12 months Vitals Screenings to include cognitive, depression, and falls Referrals and appointments  In addition, I have reviewed and discussed with patient certain preventive protocols, quality metrics, and best practice recommendations. A written personalized care plan for preventive services as well as general preventive health recommendations were provided to patient.     Darreld Mclean, CMA   05/16/2023   After Visit Summary: (MyChart) Due to this being a telephonic visit, the after visit summary with patients personalized plan was offered to patient via MyChart   Notes: Please refer to Routing Comments.  Medical screening examination/treatment/procedure(s) were performed by non-physician practitioner and as supervising physician I was immediately available  for consultation/collaboration.  I agree with above. Jacinta Shoe, MD

## 2023-05-16 NOTE — Telephone Encounter (Signed)
 Prescription Request  05/16/2023  LOV: 03/13/2023  What is the name of the medication or equipment? pantoprazole (PROTONIX) 40 MG tablet   Have you contacted your pharmacy to request a refill? No   Which pharmacy would you like this sent to?  [id number] - [place] ([place]), [place] - [address] [place] ([place]) [place] [address] Phone: [phone] Fax: [phone]    Patient notified that their request is being sent to the clinical staff for review and that they should receive a response within 2 business days.   Please advise at [facility] [phone]

## 2023-05-16 NOTE — Patient Instructions (Addendum)
 Johnny [name] , Thank you for taking time to come for your Medicare Wellness Visit. I appreciate your ongoing commitment to your health goals. Please review the following plan we discussed and let me know if I can assist you in the future.   Referrals/Orders/Follow-Ups/Clinician Recommendations: Aim for 30 minutes of exercise or brisk walking, 6-8 glasses of water, and 5 servings of fruits and vegetables each day. Referral for a Lung Cancer Screening test due to history of smoking.  This is a list of the screening recommended for you and due dates:  Health Maintenance  Topic Date Due   Hepatitis C Screening  Never done   DTaP/Tdap/Td vaccine (2 - Tdap) 03/20/2022   COVID-19 Vaccine (5 - 2024-25 season) 11/06/2022   Eye exam for diabetics  04/02/2023   Yearly kidney health urinalysis for diabetes  06/07/2023   Complete foot exam   08/04/2023   Hemoglobin A1C  09/26/2023   Yearly kidney function blood test for diabetes  12/08/2023   Medicare Annual Wellness Visit  05/15/2024   Pneumonia Vaccine  Completed   Flu Shot  Completed   HPV Vaccine  Aged Out   Colon Cancer Screening  Discontinued   Zoster (Shingles) Vaccine  Discontinued    Advanced directives: (Copy Requested) Please bring a copy of your health care power of attorney and living will to the office to be added to your chart at your convenience. You can mail to [facility] [address] [place], [place] [address] or email to [email or web address]  Next Medicare Annual Wellness Visit scheduled for next year: Yes

## 2023-05-17 ENCOUNTER — Other Ambulatory Visit: Payer: Self-pay

## 2023-05-17 MED ORDER — PANTOPRAZOLE SODIUM 40 MG PO TBEC: 40.0000 mg | DELAYED_RELEASE_TABLET | Freq: Every morning | ORAL | 3 refills | Status: DC

## 2023-06-05 ENCOUNTER — Encounter: Payer: Self-pay | Admitting: Podiatry

## 2023-06-05 ENCOUNTER — Ambulatory Visit: Payer: Medicare HMO | Admitting: Podiatry

## 2023-06-05 DIAGNOSIS — B351 Tinea unguium: Secondary | ICD-10-CM

## 2023-06-05 DIAGNOSIS — E1151 Type 2 diabetes mellitus with diabetic peripheral angiopathy without gangrene: Secondary | ICD-10-CM

## 2023-06-05 NOTE — Progress Notes (Signed)
 This patient returns to my office for at risk foot care.  This patient requires this care by a professional since this patient will be at risk due to having diabetes with vascular disease. This patient is unable to cut nails himself since the patient cannot reach his nails.These nails are painful walking and wearing shoes.  He presents to the office with his wife.This patient presents for at risk foot care today.  General Appearance  Alert, conversant and in no acute stress.  Vascular  Dorsalis pedis and posterior tibial  pulses are weakly  palpable  bilaterally.  Capillary return is within normal limits  bilaterally. Temperature is within normal limits  bilaterally.  Neurologic  Senn-Weinstein monofilament wire test within normal limits  bilaterally. Muscle power within normal limits bilaterally.  Nails Thick disfigured discolored nails with subungual debris  from hallux to fifth toes bilaterally. No evidence of bacterial infection or drainage bilaterally.  Orthopedic  No limitations of motion  feet .  No crepitus or effusions noted.  No bony pathology or digital deformities noted.  Skin  normotropic skin with no porokeratosis noted bilaterally.  No signs of infections or ulcers noted.     Onychomycosis  Pain in right toes  Pain in left toes  Consent was obtained for treatment procedures.   Mechanical debridement of nails 1-5  bilaterally performed with a nail nipper.  Filed with dremel without incident.    Return office visit     3 months                 Told patient to return for periodic foot care and evaluation due to potential at risk complications.   Helane Gunther DPM

## 2023-06-20 ENCOUNTER — Ambulatory Visit: Payer: Medicare HMO | Admitting: Internal Medicine

## 2023-06-29 ENCOUNTER — Ambulatory Visit: Admitting: Internal Medicine

## 2023-07-04 ENCOUNTER — Ambulatory Visit: Admitting: Internal Medicine

## 2023-07-26 ENCOUNTER — Encounter: Payer: Self-pay | Admitting: Internal Medicine

## 2023-07-26 ENCOUNTER — Ambulatory Visit (INDEPENDENT_AMBULATORY_CARE_PROVIDER_SITE_OTHER): Admitting: Internal Medicine

## 2023-07-26 VITALS — BP 98/60 | HR 72 | Ht 70.0 in | Wt 221.0 lb

## 2023-07-26 DIAGNOSIS — Z7984 Long term (current) use of oral hypoglycemic drugs: Secondary | ICD-10-CM | POA: Diagnosis not present

## 2023-07-26 DIAGNOSIS — E1142 Type 2 diabetes mellitus with diabetic polyneuropathy: Secondary | ICD-10-CM | POA: Diagnosis not present

## 2023-07-26 DIAGNOSIS — E1122 Type 2 diabetes mellitus with diabetic chronic kidney disease: Secondary | ICD-10-CM

## 2023-07-26 DIAGNOSIS — I10 Essential (primary) hypertension: Secondary | ICD-10-CM

## 2023-07-26 DIAGNOSIS — E1159 Type 2 diabetes mellitus with other circulatory complications: Secondary | ICD-10-CM | POA: Diagnosis not present

## 2023-07-26 DIAGNOSIS — I2510 Atherosclerotic heart disease of native coronary artery without angina pectoris: Secondary | ICD-10-CM

## 2023-07-26 DIAGNOSIS — N1832 Chronic kidney disease, stage 3b: Secondary | ICD-10-CM | POA: Diagnosis not present

## 2023-07-26 DIAGNOSIS — R350 Frequency of micturition: Secondary | ICD-10-CM | POA: Diagnosis not present

## 2023-07-26 DIAGNOSIS — N401 Enlarged prostate with lower urinary tract symptoms: Secondary | ICD-10-CM | POA: Diagnosis not present

## 2023-07-26 DIAGNOSIS — N400 Enlarged prostate without lower urinary tract symptoms: Secondary | ICD-10-CM | POA: Insufficient documentation

## 2023-07-26 LAB — CBC WITH DIFFERENTIAL/PLATELET
Basophils Absolute: 0.1 10*3/uL (ref 0.0–0.1)
Basophils Relative: 0.9 % (ref 0.0–3.0)
Eosinophils Absolute: 0.3 10*3/uL (ref 0.0–0.7)
Eosinophils Relative: 3.9 % (ref 0.0–5.0)
HCT: 40.8 % (ref 39.0–52.0)
Hemoglobin: 13.5 g/dL (ref 13.0–17.0)
Lymphocytes Relative: 28.4 % (ref 12.0–46.0)
Lymphs Abs: 2.2 10*3/uL (ref 0.7–4.0)
MCHC: 33.2 g/dL (ref 30.0–36.0)
MCV: 84.1 fl (ref 78.0–100.0)
Monocytes Absolute: 0.8 10*3/uL (ref 0.1–1.0)
Monocytes Relative: 10.1 % (ref 3.0–12.0)
Neutro Abs: 4.4 10*3/uL (ref 1.4–7.7)
Neutrophils Relative %: 56.7 % (ref 43.0–77.0)
Platelets: 253 10*3/uL (ref 150.0–400.0)
RBC: 4.85 Mil/uL (ref 4.22–5.81)
RDW: 15.5 % (ref 11.5–15.5)
WBC: 7.8 10*3/uL (ref 4.0–10.5)

## 2023-07-26 LAB — URINALYSIS
Bilirubin Urine: NEGATIVE
Hgb urine dipstick: NEGATIVE
Ketones, ur: NEGATIVE
Leukocytes,Ua: NEGATIVE
Nitrite: NEGATIVE
Specific Gravity, Urine: 1.01 (ref 1.000–1.030)
Total Protein, Urine: NEGATIVE
Urine Glucose: >=1000 — AB
Urobilinogen, UA: 0.2 (ref 0.0–1.0)
pH: 6 (ref 5.0–8.0)

## 2023-07-26 LAB — COMPREHENSIVE METABOLIC PANEL WITH GFR
ALT: 12 U/L (ref 0–53)
AST: 12 U/L (ref 0–37)
Albumin: 4.2 g/dL (ref 3.5–5.2)
Alkaline Phosphatase: 88 U/L (ref 39–117)
BUN: 32 mg/dL — ABNORMAL HIGH (ref 6–23)
CO2: 26 meq/L (ref 19–32)
Calcium: 9 mg/dL (ref 8.4–10.5)
Chloride: 102 meq/L (ref 96–112)
Creatinine, Ser: 2 mg/dL — ABNORMAL HIGH (ref 0.40–1.50)
GFR: 31.32 mL/min — ABNORMAL LOW (ref >60.00)
Glucose, Bld: 147 mg/dL — ABNORMAL HIGH (ref 70–99)
Potassium: 4.3 meq/L (ref 3.5–5.1)
Sodium: 136 meq/L (ref 135–145)
Total Bilirubin: 0.4 mg/dL (ref 0.2–1.2)
Total Protein: 7.1 g/dL (ref 6.0–8.3)

## 2023-07-26 LAB — HEMOGLOBIN A1C: Hgb A1c MFr Bld: 8.2 % — ABNORMAL HIGH (ref 4.6–6.5)

## 2023-07-26 LAB — TSH: TSH: 8.88 u[IU]/mL — ABNORMAL HIGH (ref 0.35–5.50)

## 2023-07-26 LAB — PSA: PSA: 1.77 ng/mL (ref 0.10–4.00)

## 2023-07-26 NOTE — Assessment & Plan Note (Signed)
Chronic Follow-up with Dr Shamleffer Continue on metformin, Glipizide Cont on Losartan 

## 2023-07-26 NOTE — Assessment & Plan Note (Signed)
 Pt declined rectal Check PSA

## 2023-07-26 NOTE — Assessment & Plan Note (Signed)
 F/u w/Dr Allyson Sabal Cont on Losartan, Lovastatin, ASA

## 2023-07-26 NOTE — Assessment & Plan Note (Signed)
 Check A1c.

## 2023-07-26 NOTE — Progress Notes (Signed)
 Subjective:  Patient ID: Johnny [name], male    DOB: Apr 27, 1945  Age: 78 y.o. MRN: [id number]  CC: Medical Management of Chronic Issues (3 MNTH F/U)   HPI Johnny [name] presents for HTN, DM, CAD  Outpatient Medications Prior to Visit  Medication Sig Dispense Refill   Accu-Chek Softclix Lancets lancets Use as instructed to check blood sugars 1 times a day, fasting 100 each 12   albuterol  (VENTOLIN  HFA) 108 (90 Base) MCG/ACT inhaler INHALE 2 PUFFS INTO THE LUNGS EVERY 4 HOURS AS NEEDED FOR WHEEZING OR SHORTNESS OF BREATH 18 g 5   Alcohol Swabs (DROPSAFE ALCOHOL PREP) 70 % PADS USE AS DIRECTED 100 each 3   allopurinol  (ZYLOPRIM ) 100 MG tablet TAKE 1 TABLET EVERY DAY 90 tablet 3   aspirin  81 MG EC tablet Take 2 tablets (162 mg total) by mouth daily. Swallow whole. 100 tablet 3   Blood Glucose Calibration (ACCU-CHEK AVIVA) SOLN Use as directed as needed. 1 each 2   Blood Glucose Monitoring Suppl (ACCU-CHEK GUIDE ME) w/Device KIT Use as instructed to check blood sugars 1 times a day, fasting 1 kit 0   cholecalciferol (VITAMIN D) 1000 UNITS tablet Take 1,000 Units by mouth every morning.     cilostazol  (PLETAL ) 50 MG tablet Take 1 tablet (50 mg total) by mouth 2 (two) times daily. 180 tablet 2   colchicine  0.6 MG tablet Take 1 tablet (0.6 mg total) by mouth daily. 90 tablet 3   empagliflozin  (JARDIANCE ) 25 MG TABS tablet Take 1 tablet (25 mg total) by mouth daily before breakfast. 90 tablet 3   finasteride  (PROSCAR ) 5 MG tablet TAKE 1 TABLET EVERY MORNING 90 tablet 3   fluticasone  furoate-vilanterol (BREO ELLIPTA ) 100-25 MCG/INH AEPB Inhale 1 puff into the lungs daily. 1 each 5   glipiZIDE  (GLUCOTROL ) 5 MG tablet Take 2 tablets (10 mg total) by mouth daily before breakfast AND 1 tablet (5 mg total) daily before supper. 270 tablet 3   glucose blood (ACCU-CHEK GUIDE) test strip Use as instructed, check blood sugars 1 times a day, fasting 100 each 12   irbesartan -hydrochlorothiazide  (AVALIDE)  300-12.5 MG tablet TAKE 1 TABLET EVERY DAY 90 tablet 3   Lancet Devices (ACCU-CHEK SOFTCLIX) lancets Use twice daily as instructed. Dx: 250.00 1 each 3   Lancets (ONETOUCH DELICA PLUS LANCET33G) MISC USE TWICE DAILY AS DIRECTED 200 each 3   lovastatin  (MEVACOR ) 40 MG tablet TAKE 1 TABLET EVERY MORNING 90 tablet 3   Multiple Vitamins-Minerals (MULTI-VITAMIN GUMMIES PO) Take 1 tablet by mouth daily.     nitroGLYCERIN  (NITROSTAT ) 0.4 MG SL tablet Place 1 tablet (0.4 mg total) under the tongue every 5 (five) minutes as needed for chest pain. 25 tablet 3   ONETOUCH ULTRA test strip USE TWICE DAILY AS  INSTRUCTED 200 strip 3   pantoprazole  (PROTONIX ) 40 MG tablet Take 1 tablet (40 mg total) by mouth every morning. 90 tablet 3   sildenafil  (VIAGRA ) 100 MG tablet Take 0.5-1 tablets (50-100 mg total) by mouth daily as needed for erectile dysfunction. 12 tablet 11   tamsulosin  (FLOMAX ) 0.4 MG CAPS capsule TAKE 1 CAPSULE EVERY DAY 90 capsule 3   No facility-administered medications prior to visit.    ROS: Review of Systems  Constitutional:  Negative for appetite change, fatigue and unexpected weight change.  HENT:  Negative for congestion, nosebleeds, sneezing, sore throat and trouble swallowing.   Eyes:  Negative for itching and visual disturbance.  Respiratory:  Negative for  cough.   Cardiovascular:  Negative for chest pain, palpitations and leg swelling.  Gastrointestinal:  Negative for abdominal distention, blood in stool, diarrhea and nausea.  Genitourinary:  Negative for frequency and hematuria.  Musculoskeletal:  Positive for arthralgias. Negative for back pain, gait problem, joint swelling and neck pain.  Skin:  Negative for rash.  Neurological:  Negative for dizziness, tremors, speech difficulty and weakness.  Psychiatric/Behavioral:  Negative for agitation, dysphoric mood and sleep disturbance. The patient is not nervous/anxious.     Objective:  BP 98/60   Pulse 72   Ht 5\' 10"  (1.778  m)   Wt 221 lb (100.2 kg)   SpO2 98%   BMI 31.71 kg/m   BP Readings from Last 3 Encounters:  07/26/23 98/60  05/16/23 138/80  03/29/23 124/60    Wt Readings from Last 3 Encounters:  07/26/23 221 lb (100.2 kg)  05/16/23 222 lb 6.4 oz (100.9 kg)  03/29/23 223 lb (101.2 kg)    Physical Exam Constitutional:      General: He is not in acute distress.    Appearance: He is well-developed. He is obese.     Comments: NAD  Eyes:     Conjunctiva/sclera: Conjunctivae normal.     Pupils: Pupils are equal, round, and reactive to light.  Neck:     Thyroid : No thyromegaly.     Vascular: No JVD.  Cardiovascular:     Rate and Rhythm: Normal rate and regular rhythm.     Heart sounds: Normal heart sounds. No murmur heard.    No friction rub. No gallop.  Pulmonary:     Effort: Pulmonary effort is normal. No respiratory distress.     Breath sounds: Normal breath sounds. No wheezing or rales.  Chest:     Chest Beiser: No tenderness.  Abdominal:     General: Bowel sounds are normal. There is no distension.     Palpations: Abdomen is soft. There is no mass.     Tenderness: There is no abdominal tenderness. There is no guarding or rebound.  Musculoskeletal:        General: No tenderness. Normal range of motion.     Cervical back: Normal range of motion.  Lymphadenopathy:     Cervical: No cervical adenopathy.  Skin:    General: Skin is warm and dry.     Findings: No rash.  Neurological:     Mental Status: He is alert and oriented to person, place, and time.     Cranial Nerves: No cranial nerve deficit.     Motor: No abnormal muscle tone.     Coordination: Coordination normal.     Gait: Gait normal.     Deep Tendon Reflexes: Reflexes are normal and symmetric.  Psychiatric:        Behavior: Behavior normal.        Thought Content: Thought content normal.        Judgment: Judgment normal.     Lab Results  Component Value Date   WBC 7.1 12/16/2019   HGB 12.4 (L) 12/16/2019   HCT  36.5 (L) 12/16/2019   PLT 230.0 12/16/2019   GLUCOSE 62 (L) 12/08/2022   CHOL 200 06/07/2022   TRIG 299.0 (H) 06/07/2022   HDL 34.60 (L) 06/07/2022   LDLDIRECT 93.0 06/07/2022   LDLCALC 74 11/29/2013   ALT 11 12/08/2022   AST 15 12/08/2022   NA 136 12/08/2022   K 4.6 12/08/2022   CL 104 12/08/2022   CREATININE 2.26 (H) 12/08/2022  BUN 22 12/08/2022   CO2 28 12/08/2022   TSH 6.61 (H) 03/08/2022   PSA 1.51 12/01/2021   INR 1.15 01/21/2013   HGBA1C 7.6 (A) 03/29/2023   MICROALBUR <0.7 06/07/2022    US  RENAL Result Date: 03/31/2023 CLINICAL DATA:  Chronic renal disease EXAM: RENAL / URINARY TRACT ULTRASOUND COMPLETE COMPARISON:  CT chest 07/20/2013 FINDINGS: Right Kidney: Renal measurements: 11.1 x 4.9 x 5.7 cm = volume: 161 mL. Normal renal cortical thickness and echogenicity. There is a 2.3 cm hypoechoic mass superior pole right kidney. Left Kidney: Renal measurements: 12.0 x 5.7 x 5.3 cm = volume: 189 mL. Normal renal cortical thickness and echogenicity. There is a 1.6 cm hypoechoic mass lower pole left kidney. Bladder: Appears normal for degree of bladder distention. Other: Enlarged prostate.  Measures 5.5 x 6.1 x 5.5 cm. IMPRESSION: 1. No hydronephrosis. 2. Bilateral hypoechoic masses. These may represent complex cysts. Recommend follow-up renal ultrasound in 6 months to assess for stability. 3. Enlarged prostate. Electronically Signed   By: Jone Neither M.D.   On: 03/31/2023 12:31    Assessment & Plan:   Problem List Items Addressed This Visit     DM type 2 causing vascular disease (HCC) - Primary   Chronic Follow-up with Dr Rosalea Collin Continue on metformin , Glipizide  Cont on Losartan       Relevant Orders   CBC with Differential/Platelet   Comprehensive metabolic panel with GFR   Hemoglobin A1c   TSH   Urinalysis   Essential hypertension   Chronic Cont on Irbesartan  HCT On Jardiance        Relevant Orders   CBC with Differential/Platelet   Comprehensive metabolic  panel with GFR   Hemoglobin A1c   TSH   Urinalysis   Coronary atherosclerosis   F/u w/Dr Katheryne Pane Cont on Losartan , Lovastatin , ASA      Type 2 diabetes mellitus with diabetic polyneuropathy, without long-term current use of insulin  (HCC)   Check A1c      Type 2 diabetes mellitus with stage 3b chronic kidney disease, without long-term current use of insulin  (HCC)   Re-start Jardiance  (never took it) 03/2022 Check labs      Relevant Orders   CBC with Differential/Platelet   Comprehensive metabolic panel with GFR   Hemoglobin A1c   TSH   Urinalysis   Benign prostatic hyperplasia   Pt declined rectal Check PSA      Relevant Orders   PSA      No orders of the defined types were placed in this encounter.     Follow-up: Return in about 4 months (around 11/26/2023) for a follow-up visit.  Anitra Barn, MD

## 2023-07-26 NOTE — Assessment & Plan Note (Signed)
Re-start Johnny [name] (never took it) 03/2022 Check labs

## 2023-07-26 NOTE — Assessment & Plan Note (Signed)
Chronic Cont on Irbesartan HCT On Jardiance

## 2023-07-28 ENCOUNTER — Ambulatory Visit: Payer: Medicare (Managed Care) | Admitting: Internal Medicine

## 2023-07-28 ENCOUNTER — Ambulatory Visit: Payer: Self-pay | Admitting: Internal Medicine

## 2023-07-28 ENCOUNTER — Encounter: Payer: Self-pay | Admitting: Internal Medicine

## 2023-07-28 VITALS — BP 110/72 | HR 94 | Ht 70.0 in | Wt 222.0 lb

## 2023-07-28 DIAGNOSIS — E1159 Type 2 diabetes mellitus with other circulatory complications: Secondary | ICD-10-CM

## 2023-07-28 DIAGNOSIS — Z7984 Long term (current) use of oral hypoglycemic drugs: Secondary | ICD-10-CM

## 2023-07-28 DIAGNOSIS — N1832 Chronic kidney disease, stage 3b: Secondary | ICD-10-CM | POA: Diagnosis not present

## 2023-07-28 DIAGNOSIS — E1122 Type 2 diabetes mellitus with diabetic chronic kidney disease: Secondary | ICD-10-CM

## 2023-07-28 DIAGNOSIS — E1142 Type 2 diabetes mellitus with diabetic polyneuropathy: Secondary | ICD-10-CM

## 2023-07-28 MED ORDER — GLIPIZIDE 10 MG PO TABS
10.0000 mg | ORAL_TABLET | Freq: Two times a day (BID) | ORAL | 3 refills | Status: DC
Start: 2023-07-28 — End: 2023-10-18

## 2023-07-28 MED ORDER — EMPAGLIFLOZIN 25 MG PO TABS: 25.0000 mg | ORAL_TABLET | Freq: Every day | ORAL | 3 refills | Status: AC

## 2023-07-28 MED ORDER — ACCU-CHEK GUIDE TEST VI STRP: 1.0000 | ORAL_STRIP | Freq: Every day | 12 refills | Status: DC

## 2023-07-28 NOTE — Patient Instructions (Signed)
  Take Glipizide  10 mg , before Breakfast and 10 mg before Supper  Jardiance  25 mg , 1 tablet before Breakfast      HOW TO TREAT LOW BLOOD SUGARS (Blood sugar LESS THAN 70 MG/DL) Please follow the RULE OF 15 for the treatment of hypoglycemia treatment (when your (blood sugars are less than 70 mg/dL)   STEP 1: Take 15 grams of carbohydrates when your blood sugar is low, which includes:  3-4 GLUCOSE TABS  OR 3-4 OZ OF JUICE OR REGULAR SODA OR ONE TUBE OF GLUCOSE GEL    STEP 2: RECHECK blood sugar in 15 MINUTES STEP 3: If your blood sugar is still low at the 15 minute recheck --> then, go back to STEP 1 and treat AGAIN with another 15 grams of carbohydrates.

## 2023-07-28 NOTE — Progress Notes (Signed)
 Name: Johnny [name]  Age/ Sex: 78 y.o., male   MRN/ DOB: [id number], 06-Feb-1946     PCP: Genia Kettering, MD   Reason for Endocrinology Evaluation: Type 2 Diabetes Mellitus  Initial Endocrine Consultative Visit: 09/15/2016    PATIENT IDENTIFIER: [name] is a 78 y.o. male with a past medical history of T2DM, CAD, HTN and dyslipidemia . The patient has followed with Endocrinology clinic since 09/15/2016 for consultative assistance with management of his diabetes.  DIABETIC HISTORY:  Johnny [name] was diagnosed with DM 1999, has been on oral glycemic agents since his dx . His hemoglobin A1c has ranged from 6.9% in 2021, peaking at 9.3% in 2022.  Transitioned from Dr. Washington Hacker 08/2021   On his initial visit with me in June 2023 ,he had an A1c of 7.0%,   he was on metformin , repaglinide , bromocriptine , and Rybelsus .  Patient was not taking repaglinide   (unknown reason ), we stopped bromocriptine ,  increased Rybelsus , but the patient contacted the office stating that it is cost prohibitive.  So we started glipizide  in June 2023  Stopped metformin  07/2022 due to low GFR  I attempted to prescribe Rybelsus  2024 but this was cost prohibitive  SUBJECTIVE:   During the last visit (11/14/2022): A1c 7.6%    Today (07/28/2023): Johnny [name] is here for a follow up on diabetes management.  He is accompanied by his spouse today.  He checks his blood sugars 1 times daily.    Patient continues to follow-up with vascular surgery for peripheral artery disease and claudication.  On medical management He was seen by podiatry 03/07/2023 Patient follows with cardiology, limited treatment options due to CKD  He follows with nephrology  Denies nausea, vomiting  Denies diarrhea  but has occasional constipation     HOME DIABETES REGIMEN:  Glipizide  5 mg, 2 tabs Qam and 1 tab QPM   Jardiance  25 mg daily       Statin: yes ACE-I/ARB: yes    METER DOWNLOAD SUMMARY:  4/21-5/20/2025 Fingerstick Blood Glucose Tests = 17 Average Number Tests/Day = 0.6 Overall Mean FS Glucose = 186   BG Ranges: Low = 80 High = 217  BG Target % Results: % In target = 35 % Over target = 65 % Under target = 0  Hypoglycemic Events/30 Days: BG < 50 = 0 Episodes of symptomatic severe hypoglycemia = 0    DIABETIC COMPLICATIONS: Microvascular complications:  CKD III, neuropathy Denies: retinopathy Last Eye Exam: Completed 04/08/2022  Macrovascular complications:  CAD ( S/P CABG ) Denies:  CVA, PVD   HISTORY:  Past Medical History:  Past Medical History:  Diagnosis Date   Arthritis    At risk for sleep apnea    STOP-BANG=  6   SENT TO PCP 05-21-2013   Benign positional vertigo    Benign prostatic hypertrophy    Blood transfusion without reported diagnosis 2000   CAD (coronary artery disease)    CARDIOLOGIST--  DR Manya Sells   CHF (congestive heart failure) (HCC)    Chronic ischemic heart disease, unspecified    ED (erectile dysfunction) of organic origin    GERD (gastroesophageal reflux disease)    History of CHF (congestive heart failure)    systolic   HTN (hypertension)    Hyperlipidemia    Tubular adenoma of colon 12/2009   Type 2 diabetes mellitus (HCC)    Wears glasses    Past Surgical History:  Past Surgical History:  Procedure Laterality Date  CARDIAC CATHETERIZATION  03-03-2000  dr gregg taylor   normal lvsf/  two-vessel cad significant complex stenosis at distal left main   CARDIOVASCULAR STRESS TEST  08-09-2010  DR Pete Brand TAYLOR   normal lexiscan  nuclear study/  no ischemia/  normal lvf/  ef 54%   CATARACT EXTRACTION W/ INTRAOCULAR LENS IMPLANT Right 03/07/2009   COLONOSCOPY  10/09/2020   12/30/2009   CORONARY ARTERY BYPASS GRAFT  03-06-2000   DR GERHARDT   third vessel   PENILE PROSTHESIS IMPLANT  02/08/2010   COLOPLAST 3-PIECE INFLATABLE   PENILE PROSTHESIS IMPLANT N/A 05/27/2013   Procedure: CYSTO REMOVAL OF PENILE  PROSTHESIS;  Surgeon: Mark C Ottelin, MD;  Location: [facility];  Service: Urology;  Laterality: N/A;   POLYPECTOMY  12/30/2009   +TA   TOTAL HIP ARTHROPLASTY  01/24/2011   Procedure: TOTAL HIP ARTHROPLASTY;  Surgeon: Henri Loft [name];  Location: [facility];  Service: Orthopedics;  Laterality: Left;   TOTAL HIP ARTHROPLASTY Right 01/30/2013   Procedure: RIGHT TOTAL HIP ARTHROPLASTY;  Surgeon: Aurther Blue, MD;  Location: [facility];  Service: Orthopedics;  Laterality: Right;   Social History:  reports that he quit smoking about 53 years ago. His smoking use included cigarettes. He started smoking about 75 years ago. He has a 22.2 pack-year smoking history. He has been exposed to tobacco smoke. He has never used smokeless tobacco. He reports that he does not drink alcohol and does not use drugs. Family History:  Family History  Problem Relation Age of Onset   Hypertension Mother    Diabetes Father    Cancer Sister 82       ovarian ca   Cancer - Other Brother        cancer all over   Coronary artery disease Other        1st degree male relative   Hypertension Other    Colon cancer Neg Hx    Stomach cancer Neg Hx    Esophageal cancer Neg Hx    Pancreatic cancer Neg Hx    Liver disease Neg Hx    Colon polyps Neg Hx    Rectal cancer Neg Hx      HOME MEDICATIONS: Allergies as of 07/28/2023       Reactions   Enalapril     SOB   Hydrocodone-acetaminophen  Nausea Only   Can take codein        Medication List        Accurate as of Jul 28, 2023  2:37 PM. If you have any questions, ask your nurse or doctor.          Accu-Chek Aviva Soln Use as directed as needed.   Accu-Chek Guide Me w/Device Kit Use as instructed to check blood sugars 1 times a day, fasting   accu-chek softclix lancets Use twice daily as instructed. Dx: 250.00   albuterol  108 (90 Base) MCG/ACT inhaler Commonly known as: Ventolin  HFA INHALE 2 PUFFS INTO THE LUNGS EVERY 4 HOURS AS NEEDED FOR  WHEEZING OR SHORTNESS OF BREATH   allopurinol  100 MG tablet Commonly known as: ZYLOPRIM  TAKE 1 TABLET EVERY DAY   aspirin  EC 81 MG tablet Take 2 tablets (162 mg total) by mouth daily. Swallow whole.   cholecalciferol 1000 units tablet Commonly known as: VITAMIN D Take 1,000 Units by mouth every morning.   cilostazol  50 MG tablet Commonly known as: PLETAL  Take 1 tablet (50 mg total) by mouth 2 (two) times daily.   colchicine  0.6 MG  tablet Take 1 tablet (0.6 mg total) by mouth daily.   DropSafe Alcohol Prep 70 % Pads USE AS DIRECTED   empagliflozin  25 MG Tabs tablet Commonly known as: Jardiance  Take 1 tablet (25 mg total) by mouth daily before breakfast.   finasteride  5 MG tablet Commonly known as: PROSCAR  TAKE 1 TABLET EVERY MORNING   fluticasone  furoate-vilanterol 100-25 MCG/INH Aepb Commonly known as: Breo Ellipta  Inhale 1 puff into the lungs daily.   glipiZIDE  5 MG tablet Commonly known as: GLUCOTROL  Take 2 tablets (10 mg total) by mouth daily before breakfast AND 1 tablet (5 mg total) daily before supper.   irbesartan -hydrochlorothiazide  300-12.5 MG tablet Commonly known as: AVALIDE TAKE 1 TABLET EVERY DAY   lovastatin  40 MG tablet Commonly known as: MEVACOR  TAKE 1 TABLET EVERY MORNING   MULTI-VITAMIN GUMMIES PO Take 1 tablet by mouth daily.   nitroGLYCERIN  0.4 MG SL tablet Commonly known as: NITROSTAT  Place 1 tablet (0.4 mg total) under the tongue every 5 (five) minutes as needed for chest pain.   OneTouch Delica Plus Lancet33G Misc USE TWICE DAILY AS DIRECTED   Accu-Chek Softclix Lancets lancets Use as instructed to check blood sugars 1 times a day, fasting   OneTouch Ultra test strip Generic drug: glucose blood USE TWICE DAILY AS  INSTRUCTED   Accu-Chek Guide test strip Generic drug: glucose blood Use as instructed, check blood sugars 1 times a day, fasting   pantoprazole  40 MG tablet Commonly known as: PROTONIX  Take 1 tablet (40 mg total)  by mouth every morning.   sildenafil  100 MG tablet Commonly known as: Viagra  Take 0.5-1 tablets (50-100 mg total) by mouth daily as needed for erectile dysfunction.   tamsulosin  0.4 MG Caps capsule Commonly known as: FLOMAX  TAKE 1 CAPSULE EVERY DAY         OBJECTIVE:   Vital Signs: BP 110/72 (BP Location: Left Arm, Patient Position: Sitting, Cuff Size: Normal)   Pulse 94   Ht 5\' 10"  (1.778 m)   Wt 222 lb (100.7 kg)   SpO2 96%   BMI 31.85 kg/m   Wt Readings from Last 3 Encounters:  07/28/23 222 lb (100.7 kg)  07/26/23 221 lb (100.2 kg)  05/16/23 222 lb 6.4 oz (100.9 kg)     Exam: General: Pt appears well and is in NAD  Lungs: Clear with good BS bilat  Heart: RRR   Extremities: No pretibial edema   Neuro: MS is good with appropriate affect, pt is alert and Ox3    DM foot exam: 03/07/2023 per podiatry     DATA REVIEWED:  Lab Results  Component Value Date   HGBA1C 8.2 (H) 07/26/2023   HGBA1C 7.6 (A) 03/29/2023   HGBA1C 7.7 (H) 12/08/2022    Latest Reference Range & Units 07/26/23 16:05  Sodium 135 - 145 mEq/L 136  Potassium 3.5 - 5.1 mEq/L 4.3  Chloride 96 - 112 mEq/L 102  CO2 19 - 32 mEq/L 26  Glucose 70 - 99 mg/dL 914 (H)  BUN 6 - 23 mg/dL 32 (H)  Creatinine 7.82 - 1.50 mg/dL 9.56 (H)  Calcium 8.4 - 10.5 mg/dL 9.0  Alkaline Phosphatase 39 - 117 U/L 88  Albumin 3.5 - 5.2 g/dL 4.2  AST 0 - 37 U/L 12  ALT 0 - 53 U/L 12  Total Protein 6.0 - 8.3 g/dL 7.1  Total Bilirubin 0.2 - 1.2 mg/dL 0.4  GFR >21.30 mL/min 31.32 (L)     ASSESSMENT / PLAN / RECOMMENDATIONS:   1)  Type 2 Diabetes Mellitus, Sub-Optimally  controlled, With neuropathic, CKD III  and macrovascular  complications - Most recent A1c of 8.2 %. Goal A1c < 7.0 %.    -A1c continues to trend upwards, patient admits to dietary indiscretions -We had to stop metformin  due to a GFR <35 -Rybelsus  is cost prohibitive, today we entertain the idea of restarting Rybelsus , but the patient believes he  is on too many medications and it is hard for him to keep up with them, we opted to increase glipizide  as below   MEDICATIONS:  Continue Jardiance  25 mg daily Increase glipizide  10 mg, 1 tab before Breakfast and 1 tab Before Supper    EDUCATION / INSTRUCTIONS: BG monitoring instructions: Patient is instructed to check his blood sugars 1 times a day, fasting. Call [facility] [facility] if: BG persistently < 70  I reviewed the Rule of 15 for the treatment of hypoglycemia in detail with the patient. Literature supplied.   2) Diabetic complications:  Eye: Does not have known diabetic retinopathy.  Neuro/ Feet: Does  have known diabetic peripheral neuropathy .  Renal: Patient does have known baseline CKD. He   is  on an ACEI/ARB at present.    3)CKD III:  -GFR has been fluctuating -Patient on ARB - Follows with Nephrology   F/U in 3 months  I spent 25 minutes preparing to see the patient by review of recent labs, imaging and procedures, obtaining and reviewing separately obtained history, communicating with the patient/family or caregiver, ordering medications, tests or procedures, and documenting clinical information in the EHR including the differential Dx, treatment, and any further evaluation and other management    Signed electronically by: Natale Bail, MD  [facility] Endocrin[facility]  [facility] [address] [place] [place], [place] [address] Phone: [phone] FAX: [phone]   CC: Genia Kettering, MD [address] [place] [address] Phone: [phone]  Fax: [phone]  Return to Endocrinology clinic as below: Future Appointments  Date Time Provider Department Center  08/22/2023  2:00 PM Avanell Leigh, MD [facility]  09/05/2023  1:15 PM Ruffin Cotton, DPM [facility]  09/22/2023  2:00 PM [facility]  09/22/2023  2:40 PM Philipp Brawn, MD [facility]  11/23/2023  2:20 PM Plotnikov,  Oakley Bellman, MD [facility] None  06/24/2024  1:10 PM [facility]-ANNUAL WELLNESS VISIT [facility] None  06/24/2024  2:00 PM Plotnikov, Oakley Bellman, MD [facility] None

## 2023-08-03 DIAGNOSIS — H04123 Dry eye syndrome of bilateral lacrimal glands: Secondary | ICD-10-CM | POA: Diagnosis not present

## 2023-08-03 DIAGNOSIS — H26493 Other secondary cataract, bilateral: Secondary | ICD-10-CM | POA: Diagnosis not present

## 2023-08-03 DIAGNOSIS — E119 Type 2 diabetes mellitus without complications: Secondary | ICD-10-CM | POA: Diagnosis not present

## 2023-08-03 DIAGNOSIS — H35033 Hypertensive retinopathy, bilateral: Secondary | ICD-10-CM | POA: Diagnosis not present

## 2023-08-03 LAB — HM DIABETES EYE EXAM

## 2023-08-22 ENCOUNTER — Ambulatory Visit: Payer: Medicare HMO | Attending: Cardiovascular Disease | Admitting: Cardiovascular Disease

## 2023-08-23 ENCOUNTER — Encounter: Payer: Self-pay | Admitting: Cardiovascular Disease

## 2023-09-05 ENCOUNTER — Ambulatory Visit: Admitting: Podiatry

## 2023-09-05 ENCOUNTER — Encounter: Payer: Self-pay | Admitting: Podiatry

## 2023-09-05 DIAGNOSIS — M79675 Pain in left toe(s): Secondary | ICD-10-CM | POA: Diagnosis not present

## 2023-09-05 DIAGNOSIS — E1151 Type 2 diabetes mellitus with diabetic peripheral angiopathy without gangrene: Secondary | ICD-10-CM

## 2023-09-05 DIAGNOSIS — M79674 Pain in right toe(s): Secondary | ICD-10-CM | POA: Diagnosis not present

## 2023-09-05 DIAGNOSIS — B351 Tinea unguium: Secondary | ICD-10-CM | POA: Diagnosis not present

## 2023-09-05 NOTE — Progress Notes (Signed)
 This patient returns to my office for at risk foot care.  This patient requires this care by a professional since this patient will be at risk due to having diabetes with vascular disease. This patient is unable to cut nails himself since the patient cannot reach his nails.These nails are painful walking and wearing shoes.  He presents to the office with his wife.This patient presents for at risk foot care today.  General Appearance  Alert, conversant and in no acute stress.  Vascular  Dorsalis pedis and posterior tibial  pulses are weakly  palpable  bilaterally.  Capillary return is within normal limits  bilaterally. Temperature is within normal limits  bilaterally.  Neurologic  Senn-Weinstein monofilament wire test within normal limits  bilaterally. Muscle power within normal limits bilaterally.  Nails Thick disfigured discolored nails with subungual debris  from hallux to fifth toes bilaterally. No evidence of bacterial infection or drainage bilaterally.  Orthopedic  No limitations of motion  feet .  No crepitus or effusions noted.  No bony pathology or digital deformities noted.  Skin  normotropic skin with no porokeratosis noted bilaterally.  No signs of infections or ulcers noted.     Onychomycosis  Pain in right toes  Pain in left toes  Consent was obtained for treatment procedures.   Mechanical debridement of nails 1-5  bilaterally performed with a nail nipper.  Filed with dremel without incident.    Return office visit     3 months                 Told patient to return for periodic foot care and evaluation due to potential at risk complications.   Helane Gunther DPM

## 2023-09-21 NOTE — Progress Notes (Unsigned)
 Patient ID: Johnny [name], male   DOB: December 24, 1945, 78 y.o.   MRN: [id number]  Reason for Consult: No chief complaint on file.   Referred by Plotnikov, Karlynn GAILS, MD  Subjective:     HPI Johnny [name] is a 78 y.o. male with PAD and claudication.  He was last seen in January of this year.  At that time he had stopped Pletal  as he was having some shortness of breath.  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  Today reports ***  Past Medical History:  Diagnosis Date   Arthritis    At risk for sleep apnea    STOP-BANG=  6   SENT TO PCP 05-21-2013   Benign positional vertigo    Benign prostatic hypertrophy    Blood transfusion without reported diagnosis 2000   CAD (coronary artery disease)    CARDIOLOGIST--  DR DANELLE BIRMINGH[place]   CHF (congestive heart failure) (HCC)    Chronic ischemic heart disease, unspecified    ED (erectile dysfunction) of organic origin    GERD (gastroesophageal reflux disease)    History of CHF (congestive heart failure)    systolic   HTN (hypertension)    Hyperlipidemia    Tubular adenoma of colon 12/2009   Type 2 diabetes mellitus (HCC)    Wears glasses    Family History  Problem Relation Age of Onset   Hypertension Mother    Diabetes Father    Cancer Sister 77       ovarian ca   Cancer - Other Brother        cancer all over   Coronary artery disease Other        1st degree male relative   Hypertension Other    Colon cancer Neg Hx    Stomach cancer Neg Hx    Esophageal cancer Neg Hx    Pancreatic cancer Neg Hx    Liver disease Neg Hx    Colon polyps Neg Hx    Rectal cancer Neg Hx    Past Surgical History:  Procedure Laterality Date   CARDIAC CATHETERIZATION  03-03-2000  dr danelle taylor   normal lvsf/  two-vessel cad significant complex stenosis at distal left main   CARDIOVASCULAR STRESS TEST  08-09-2010  DR DANELLE  TAYLOR   normal lexiscan  nuclear study/  no ischemia/  normal lvf/  ef 54%   CATARACT EXTRACTION W/ INTRAOCULAR LENS IMPLANT Right 03/07/2009   COLONOSCOPY  10/09/2020   12/30/2009   CORONARY ARTERY BYPASS GRAFT  03-06-2000   DR GERHARDT   third vessel   PENILE PROSTHESIS IMPLANT  02/08/2010   COLOPLAST 3-PIECE INFLATABLE   PENILE PROSTHESIS IMPLANT N/A 05/27/2013   Procedure: CYSTO REMOVAL OF PENILE PROSTHESIS;  Surgeon: Mark C Ottelin, MD;  [facility];  Service: Urology;  Laterality: N/A;   POLYPECTOMY  12/30/2009   +TA   TOTAL HIP ARTHROPLASTY  01/24/2011   Procedure: TOTAL HIP ARTHROPLASTY;  Surgeon: Dempsey GAILS [name];  Location: [facility];  Service: Orthopedics;  Laterality: Left;   TOTAL HIP ARTHROPLASTY Right 01/30/2013   Procedure: RIGHT TOTAL HIP ARTHROPLASTY;  Surgeon: Dempsey GAILS Moan, MD;  Location: [facility];  Service: Orthopedics;  Laterality: Right;    Short Social History:  Social History   Tobacco Use   Smoking status: Former    Current packs/day: 0.00  Average packs/day: 1 pack/day for 22.2 years (22.2 ttl pk-yrs)    Types: Cigarettes    Start date: 03/07/1948    Quit date: 05/22/1970    Years since quitting: 53.3    Passive exposure: Past   Smokeless tobacco: Never  Substance Use Topics   Alcohol use: No    Comment: seldom    Allergies  Allergen Reactions   Enalapril      SOB   Hydrocodone-Acetaminophen  Nausea Only    Can take codein    Current Outpatient Medications  Medication Sig Dispense Refill   Accu-Chek Softclix Lancets lancets Use as instructed to check blood sugars 1 times a day, fasting 100 each 12   albuterol  (VENTOLIN  HFA) 108 (90 Base) MCG/ACT inhaler INHALE 2 PUFFS INTO THE LUNGS EVERY 4 HOURS AS NEEDED FOR WHEEZING OR SHORTNESS OF BREATH 18 g 5   Alcohol Swabs (DROPSAFE ALCOHOL PREP) 70 % PADS USE AS DIRECTED 100 each 3   allopurinol  (ZYLOPRIM ) 100 MG tablet TAKE 1 TABLET EVERY DAY 90 tablet 3   aspirin  81 MG EC  tablet Take 2 tablets (162 mg total) by mouth daily. Swallow whole. 100 tablet 3   Blood Glucose Monitoring Suppl (ACCU-CHEK GUIDE ME) w/Device KIT Use as instructed to check blood sugars 1 times a day, fasting 1 kit 0   cholecalciferol (VITAMIN D) 1000 UNITS tablet Take 1,000 Units by mouth every morning.     cilostazol  (PLETAL ) 50 MG tablet Take 1 tablet (50 mg total) by mouth 2 (two) times daily. 180 tablet 2   colchicine  0.6 MG tablet Take 1 tablet (0.6 mg total) by mouth daily. 90 tablet 3   empagliflozin  (JARDIANCE ) 25 MG TABS tablet Take 1 tablet (25 mg total) by mouth daily before breakfast. 90 tablet 3   finasteride  (PROSCAR ) 5 MG tablet TAKE 1 TABLET EVERY MORNING 90 tablet 3   fluticasone  furoate-vilanterol (BREO ELLIPTA ) 100-25 MCG/INH AEPB Inhale 1 puff into the lungs daily. 1 each 5   glipiZIDE  (GLUCOTROL ) 10 MG tablet Take 1 tablet (10 mg total) by mouth 2 (two) times daily before a meal. 180 tablet 3   glucose blood (ACCU-CHEK GUIDE TEST) test strip 1 each by Other route daily in the afternoon. Use as instructed 100 each 12   irbesartan -hydrochlorothiazide  (AVALIDE) 300-12.5 MG tablet TAKE 1 TABLET EVERY DAY 90 tablet 3   Lancet Devices (ACCU-CHEK SOFTCLIX) lancets Use twice daily as instructed. Dx: 250.00 1 each 3   Lancets (ONETOUCH DELICA PLUS LANCET33G) MISC USE TWICE DAILY AS DIRECTED 200 each 3   lovastatin  (MEVACOR ) 40 MG tablet TAKE 1 TABLET EVERY MORNING 90 tablet 3   Multiple Vitamins-Minerals (MULTI-VITAMIN GUMMIES PO) Take 1 tablet by mouth daily.     nitroGLYCERIN  (NITROSTAT ) 0.4 MG SL tablet Place 1 tablet (0.4 mg total) under the tongue every 5 (five) minutes as needed for chest pain. 25 tablet 3   pantoprazole  (PROTONIX ) 40 MG tablet Take 1 tablet (40 mg total) by mouth every morning. 90 tablet 3   sildenafil  (VIAGRA ) 100 MG tablet Take 0.5-1 tablets (50-100 mg total) by mouth daily as needed for erectile dysfunction. 12 tablet 11   tamsulosin  (FLOMAX ) 0.4 MG CAPS  capsule TAKE 1 CAPSULE EVERY DAY 90 capsule 3   No current facility-administered medications for this visit.    REVIEW OF SYSTEMS  All other systems were reviewed and are negative     Objective:  Objective   There were no vitals filed for this visit. There is no  height or weight on file to calculate BMI.  Physical Exam General: no acute distress Cardiac: hemodynamically stable Pulm: normal work of breathing Abdomen: non-tender, no pulsatile mass*** Neuro: alert, no focal deficit Extremities: no edema, cyanosis or wounds*** Vascular:   Right: ***  Left: ***  Data: ABI ***  Previously: Right was 0.70 with a toe pressure of 64 Left was 0.76 with a toe pressure of 55     Assessment/Plan:   JAMAURION SLEMMER is a 78 y.o. male with with PAD and claudication.  His symptoms are well-controlled he continues to walk is much as possible and can make it over a mile without pain.  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  His ABIs today demonstrate ***  Recommendations to optimize cardiovascular risk: Abstinence from all tobacco products. Blood glucose control with goal A1c < 7%. Blood pressure control with goal blood pressure < 140/90 mmHg. Lipid reduction therapy with goal LDL-C <100 mg/dL  Aspirin  81mg  PO QD.  Atorvastatin 40-80mg  PO QD (or other high intensity statin therapy).   Norman GORMAN Serve MD [facility]

## 2023-09-21 NOTE — H&P (View-Only) (Signed)
 Patient ID: Johnny [name], male   DOB: December 24, 1945, 78 y.o.   MRN: [id number]  Reason for Consult: No chief complaint on file.   Referred by Plotnikov, Karlynn GAILS, MD  Subjective:     HPI Johnny [name] is a 78 y.o. male with PAD and claudication.  He was last seen in January of this year.  At that time he had stopped Pletal  as he was having some shortness of breath.  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  Today reports ***  Past Medical History:  Diagnosis Date   Arthritis    At risk for sleep apnea    STOP-BANG=  6   SENT TO PCP 05-21-2013   Benign positional vertigo    Benign prostatic hypertrophy    Blood transfusion without reported diagnosis 2000   CAD (coronary artery disease)    CARDIOLOGIST--  DR DANELLE BIRMINGH[place]   CHF (congestive heart failure) (HCC)    Chronic ischemic heart disease, unspecified    ED (erectile dysfunction) of organic origin    GERD (gastroesophageal reflux disease)    History of CHF (congestive heart failure)    systolic   HTN (hypertension)    Hyperlipidemia    Tubular adenoma of colon 12/2009   Type 2 diabetes mellitus (HCC)    Wears glasses    Family History  Problem Relation Age of Onset   Hypertension Mother    Diabetes Father    Cancer Sister 77       ovarian ca   Cancer - Other Brother        cancer all over   Coronary artery disease Other        1st degree male relative   Hypertension Other    Colon cancer Neg Hx    Stomach cancer Neg Hx    Esophageal cancer Neg Hx    Pancreatic cancer Neg Hx    Liver disease Neg Hx    Colon polyps Neg Hx    Rectal cancer Neg Hx    Past Surgical History:  Procedure Laterality Date   CARDIAC CATHETERIZATION  03-03-2000  dr danelle taylor   normal lvsf/  two-vessel cad significant complex stenosis at distal left main   CARDIOVASCULAR STRESS TEST  08-09-2010  DR DANELLE  TAYLOR   normal lexiscan  nuclear study/  no ischemia/  normal lvf/  ef 54%   CATARACT EXTRACTION W/ INTRAOCULAR LENS IMPLANT Right 03/07/2009   COLONOSCOPY  10/09/2020   12/30/2009   CORONARY ARTERY BYPASS GRAFT  03-06-2000   DR GERHARDT   third vessel   PENILE PROSTHESIS IMPLANT  02/08/2010   COLOPLAST 3-PIECE INFLATABLE   PENILE PROSTHESIS IMPLANT N/A 05/27/2013   Procedure: CYSTO REMOVAL OF PENILE PROSTHESIS;  Surgeon: Mark C Ottelin, MD;  [facility];  Service: Urology;  Laterality: N/A;   POLYPECTOMY  12/30/2009   +TA   TOTAL HIP ARTHROPLASTY  01/24/2011   Procedure: TOTAL HIP ARTHROPLASTY;  Surgeon: Dempsey GAILS [name];  Location: [facility];  Service: Orthopedics;  Laterality: Left;   TOTAL HIP ARTHROPLASTY Right 01/30/2013   Procedure: RIGHT TOTAL HIP ARTHROPLASTY;  Surgeon: Dempsey GAILS Moan, MD;  Location: [facility];  Service: Orthopedics;  Laterality: Right;    Short Social History:  Social History   Tobacco Use   Smoking status: Former    Current packs/day: 0.00  Average packs/day: 1 pack/day for 22.2 years (22.2 ttl pk-yrs)    Types: Cigarettes    Start date: 03/07/1948    Quit date: 05/22/1970    Years since quitting: 53.3    Passive exposure: Past   Smokeless tobacco: Never  Substance Use Topics   Alcohol use: No    Comment: seldom    Allergies  Allergen Reactions   Enalapril      SOB   Hydrocodone-Acetaminophen  Nausea Only    Can take codein    Current Outpatient Medications  Medication Sig Dispense Refill   Accu-Chek Softclix Lancets lancets Use as instructed to check blood sugars 1 times a day, fasting 100 each 12   albuterol  (VENTOLIN  HFA) 108 (90 Base) MCG/ACT inhaler INHALE 2 PUFFS INTO THE LUNGS EVERY 4 HOURS AS NEEDED FOR WHEEZING OR SHORTNESS OF BREATH 18 g 5   Alcohol Swabs (DROPSAFE ALCOHOL PREP) 70 % PADS USE AS DIRECTED 100 each 3   allopurinol  (ZYLOPRIM ) 100 MG tablet TAKE 1 TABLET EVERY DAY 90 tablet 3   aspirin  81 MG EC  tablet Take 2 tablets (162 mg total) by mouth daily. Swallow whole. 100 tablet 3   Blood Glucose Monitoring Suppl (ACCU-CHEK GUIDE ME) w/Device KIT Use as instructed to check blood sugars 1 times a day, fasting 1 kit 0   cholecalciferol (VITAMIN D) 1000 UNITS tablet Take 1,000 Units by mouth every morning.     cilostazol  (PLETAL ) 50 MG tablet Take 1 tablet (50 mg total) by mouth 2 (two) times daily. 180 tablet 2   colchicine  0.6 MG tablet Take 1 tablet (0.6 mg total) by mouth daily. 90 tablet 3   empagliflozin  (JARDIANCE ) 25 MG TABS tablet Take 1 tablet (25 mg total) by mouth daily before breakfast. 90 tablet 3   finasteride  (PROSCAR ) 5 MG tablet TAKE 1 TABLET EVERY MORNING 90 tablet 3   fluticasone  furoate-vilanterol (BREO ELLIPTA ) 100-25 MCG/INH AEPB Inhale 1 puff into the lungs daily. 1 each 5   glipiZIDE  (GLUCOTROL ) 10 MG tablet Take 1 tablet (10 mg total) by mouth 2 (two) times daily before a meal. 180 tablet 3   glucose blood (ACCU-CHEK GUIDE TEST) test strip 1 each by Other route daily in the afternoon. Use as instructed 100 each 12   irbesartan -hydrochlorothiazide  (AVALIDE) 300-12.5 MG tablet TAKE 1 TABLET EVERY DAY 90 tablet 3   Lancet Devices (ACCU-CHEK SOFTCLIX) lancets Use twice daily as instructed. Dx: 250.00 1 each 3   Lancets (ONETOUCH DELICA PLUS LANCET33G) MISC USE TWICE DAILY AS DIRECTED 200 each 3   lovastatin  (MEVACOR ) 40 MG tablet TAKE 1 TABLET EVERY MORNING 90 tablet 3   Multiple Vitamins-Minerals (MULTI-VITAMIN GUMMIES PO) Take 1 tablet by mouth daily.     nitroGLYCERIN  (NITROSTAT ) 0.4 MG SL tablet Place 1 tablet (0.4 mg total) under the tongue every 5 (five) minutes as needed for chest pain. 25 tablet 3   pantoprazole  (PROTONIX ) 40 MG tablet Take 1 tablet (40 mg total) by mouth every morning. 90 tablet 3   sildenafil  (VIAGRA ) 100 MG tablet Take 0.5-1 tablets (50-100 mg total) by mouth daily as needed for erectile dysfunction. 12 tablet 11   tamsulosin  (FLOMAX ) 0.4 MG CAPS  capsule TAKE 1 CAPSULE EVERY DAY 90 capsule 3   No current facility-administered medications for this visit.    REVIEW OF SYSTEMS  All other systems were reviewed and are negative     Objective:  Objective   There were no vitals filed for this visit. There is no  height or weight on file to calculate BMI.  Physical Exam General: no acute distress Cardiac: hemodynamically stable Pulm: normal work of breathing Abdomen: non-tender, no pulsatile mass*** Neuro: alert, no focal deficit Extremities: no edema, cyanosis or wounds*** Vascular:   Right: ***  Left: ***  Data: ABI ***  Previously: Right was 0.70 with a toe pressure of 64 Left was 0.76 with a toe pressure of 55     Assessment/Plan:   Johnny [name] is a 78 y.o. male with with PAD and claudication.  His symptoms are well-controlled he continues to walk is much as possible and can make it over a mile without pain.  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  His ABIs today demonstrate ***  Recommendations to optimize cardiovascular risk: Abstinence from all tobacco products. Blood glucose control with goal A1c < 7%. Blood pressure control with goal blood pressure < 140/90 mmHg. Lipid reduction therapy with goal LDL-C <100 mg/dL  Aspirin  81mg  PO QD.  Atorvastatin 40-80mg  PO QD (or other high intensity statin therapy).   Norman GORMAN Serve MD [facility]

## 2023-09-22 ENCOUNTER — Ambulatory Visit (INDEPENDENT_AMBULATORY_CARE_PROVIDER_SITE_OTHER): Payer: Medicare (Managed Care) | Admitting: Vascular Surgery

## 2023-09-22 ENCOUNTER — Ambulatory Visit (HOSPITAL_COMMUNITY)
Admission: RE | Admit: 2023-09-22 | Discharge: 2023-09-22 | Disposition: A | Discharge status: Home / Self Care | Payer: Medicare (Managed Care) | Source: Ambulatory Visit | Attending: Vascular Surgery | Admitting: Vascular Surgery

## 2023-09-22 ENCOUNTER — Telehealth: Payer: Self-pay

## 2023-09-22 ENCOUNTER — Encounter: Payer: Self-pay | Admitting: Vascular Surgery

## 2023-09-22 VITALS — BP 157/60 | HR 61 | Temp 98.3°F | Resp 18 | Ht 70.0 in | Wt 224.4 lb

## 2023-09-22 DIAGNOSIS — I739 Peripheral vascular disease, unspecified: Secondary | ICD-10-CM

## 2023-09-22 LAB — VAS US ABI WITH/WO TBI
Left ABI: 0.72
Right ABI: 0.66

## 2023-09-22 NOTE — Telephone Encounter (Signed)
 Patient will call to schedule angiogram with Dr. Pearline.

## 2023-09-29 ENCOUNTER — Telehealth: Payer: Self-pay

## 2023-09-29 NOTE — Telephone Encounter (Signed)
 Attempted to call for surgery scheduling. LVM

## 2023-10-02 ENCOUNTER — Other Ambulatory Visit: Payer: Self-pay

## 2023-10-02 ENCOUNTER — Telehealth: Payer: Self-pay

## 2023-10-02 DIAGNOSIS — I70213 Atherosclerosis of native arteries of extremities with intermittent claudication, bilateral legs: Secondary | ICD-10-CM

## 2023-10-02 NOTE — Telephone Encounter (Signed)
 Attempted to call for surgery scheduling. LVM

## 2023-10-03 DIAGNOSIS — I739 Peripheral vascular disease, unspecified: Secondary | ICD-10-CM | POA: Diagnosis not present

## 2023-10-03 DIAGNOSIS — M109 Gout, unspecified: Secondary | ICD-10-CM | POA: Diagnosis not present

## 2023-10-03 DIAGNOSIS — I129 Hypertensive chronic kidney disease with stage 1 through stage 4 chronic kidney disease, or unspecified chronic kidney disease: Secondary | ICD-10-CM | POA: Diagnosis not present

## 2023-10-03 DIAGNOSIS — E785 Hyperlipidemia, unspecified: Secondary | ICD-10-CM | POA: Diagnosis not present

## 2023-10-03 DIAGNOSIS — N281 Cyst of kidney, acquired: Secondary | ICD-10-CM | POA: Diagnosis not present

## 2023-10-03 DIAGNOSIS — N400 Enlarged prostate without lower urinary tract symptoms: Secondary | ICD-10-CM | POA: Diagnosis not present

## 2023-10-03 DIAGNOSIS — E1122 Type 2 diabetes mellitus with diabetic chronic kidney disease: Secondary | ICD-10-CM | POA: Diagnosis not present

## 2023-10-03 DIAGNOSIS — N1832 Chronic kidney disease, stage 3b: Secondary | ICD-10-CM | POA: Diagnosis not present

## 2023-10-04 ENCOUNTER — Other Ambulatory Visit: Payer: Self-pay | Admitting: Internal Medicine

## 2023-10-04 DIAGNOSIS — N281 Cyst of kidney, acquired: Secondary | ICD-10-CM

## 2023-10-06 ENCOUNTER — Telehealth: Payer: Self-pay

## 2023-10-06 ENCOUNTER — Ambulatory Visit
Admission: RE | Admit: 2023-10-06 | Discharge: 2023-10-06 | Disposition: A | Discharge status: Home / Self Care | Source: Ambulatory Visit | Attending: Internal Medicine | Admitting: Internal Medicine

## 2023-10-06 DIAGNOSIS — N281 Cyst of kidney, acquired: Secondary | ICD-10-CM | POA: Diagnosis not present

## 2023-10-06 NOTE — Telephone Encounter (Signed)
 Patient was identified as falling into the [place] Measure - Diabetes.   Patient was: Appointment already scheduled for:  11/23/23.

## 2023-10-09 ENCOUNTER — Ambulatory Visit (HOSPITAL_COMMUNITY)
Admission: RE | Disposition: A | Discharge status: Home / Self Care | Payer: Self-pay | Source: Home / Self Care | Attending: Vascular Surgery

## 2023-10-09 ENCOUNTER — Ambulatory Visit (HOSPITAL_COMMUNITY)
Admission: RE | Admit: 2023-10-09 | Discharge: 2023-10-09 | Disposition: A | Discharge status: Home / Self Care | Attending: Vascular Surgery | Admitting: Vascular Surgery

## 2023-10-09 ENCOUNTER — Other Ambulatory Visit: Payer: Self-pay

## 2023-10-09 ENCOUNTER — Encounter (HOSPITAL_COMMUNITY): Payer: Self-pay | Admitting: Vascular Surgery

## 2023-10-09 DIAGNOSIS — Z79899 Other long term (current) drug therapy: Secondary | ICD-10-CM

## 2023-10-09 DIAGNOSIS — Z87891 Personal history of nicotine dependence: Secondary | ICD-10-CM | POA: Insufficient documentation

## 2023-10-09 DIAGNOSIS — I70213 Atherosclerosis of native arteries of extremities with intermittent claudication, bilateral legs: Secondary | ICD-10-CM | POA: Diagnosis not present

## 2023-10-09 DIAGNOSIS — Z7982 Long term (current) use of aspirin: Secondary | ICD-10-CM

## 2023-10-09 HISTORY — PX: ABDOMINAL AORTOGRAM W/LOWER EXTREMITY: CATH118223

## 2023-10-09 HISTORY — PX: LOWER EXTREMITY ANGIOGRAPHY: CATH118251

## 2023-10-09 LAB — POCT I-STAT, CHEM 8
BUN: 33 mg/dL — ABNORMAL HIGH (ref 8–23)
Calcium, Ion: 1.27 mmol/L (ref 1.15–1.40)
Chloride: 106 mmol/L (ref 98–111)
Creatinine, Ser: 2.2 mg/dL — ABNORMAL HIGH (ref 0.61–1.24)
Glucose, Bld: 186 mg/dL — ABNORMAL HIGH (ref 70–99)
HCT: 37 % — ABNORMAL LOW (ref 39.0–52.0)
Hemoglobin: 12.6 g/dL — ABNORMAL LOW (ref 13.0–17.0)
Potassium: 4.3 mmol/L (ref 3.5–5.1)
Sodium: 138 mmol/L (ref 135–145)
TCO2: 21 mmol/L — ABNORMAL LOW (ref 22–32)

## 2023-10-09 LAB — GLUCOSE, CAPILLARY: Glucose-Capillary: 183 mg/dL — ABNORMAL HIGH (ref 70–99)

## 2023-10-09 SURGERY — ABDOMINAL AORTOGRAM W/LOWER EXTREMITY
Anesthesia: LOCAL

## 2023-10-09 MED ORDER — MIDAZOLAM HCL 2 MG/2ML IJ SOLN
INTRAMUSCULAR | Status: DC | PRN
  Administered 2023-10-09: 1 mg via INTRAVENOUS

## 2023-10-09 MED ORDER — SODIUM CHLORIDE 0.9% FLUSH: 3.0000 mL | INTRAVENOUS | Status: DC | PRN

## 2023-10-09 MED ORDER — FENTANYL CITRATE (PF) 100 MCG/2ML IJ SOLN
INTRAMUSCULAR | Status: AC
  Filled 2023-10-09: qty 2

## 2023-10-09 MED ORDER — ACETAMINOPHEN 325 MG PO TABS: 650.0000 mg | ORAL_TABLET | ORAL | Status: DC | PRN

## 2023-10-09 MED ORDER — IODIXANOL 320 MG/ML IV SOLN
INTRAVENOUS | Status: DC | PRN
  Administered 2023-10-09: 20 mL via INTRA_ARTERIAL

## 2023-10-09 MED ORDER — SODIUM CHLORIDE 0.9 % IV SOLN: 250.0000 mL | INTRAVENOUS | Status: DC | PRN

## 2023-10-09 MED ORDER — MIDAZOLAM HCL 2 MG/2ML IJ SOLN
INTRAMUSCULAR | Status: AC
  Filled 2023-10-09: qty 2

## 2023-10-09 MED ORDER — FENTANYL CITRATE (PF) 100 MCG/2ML IJ SOLN
INTRAMUSCULAR | Status: DC | PRN
  Administered 2023-10-09: 50 ug via INTRAVENOUS

## 2023-10-09 MED ORDER — CLOPIDOGREL BISULFATE 300 MG PO TABS
ORAL_TABLET | ORAL | Status: AC
  Filled 2023-10-09: qty 1

## 2023-10-09 MED ORDER — LABETALOL HCL 5 MG/ML IV SOLN: 10.0000 mg | INTRAVENOUS | Status: DC | PRN

## 2023-10-09 MED ORDER — ONDANSETRON HCL 4 MG/2ML IJ SOLN
INTRAMUSCULAR | Status: DC | PRN
  Administered 2023-10-09: 4 mg via INTRAVENOUS

## 2023-10-09 MED ORDER — SODIUM CHLORIDE 0.9 % WEIGHT BASED INFUSION: 1.0000 mL/kg/h | INTRAVENOUS | Status: DC

## 2023-10-09 MED ORDER — SODIUM CHLORIDE 0.9% FLUSH: 3.0000 mL | Freq: Two times a day (BID) | INTRAVENOUS | Status: DC

## 2023-10-09 MED ORDER — LIDOCAINE HCL (PF) 1 % IJ SOLN
INTRAMUSCULAR | Status: AC
Start: 2023-10-09 — End: 2023-10-09
  Filled 2023-10-09: qty 30

## 2023-10-09 MED ORDER — LIDOCAINE HCL (PF) 1 % IJ SOLN
INTRAMUSCULAR | Status: DC | PRN
  Administered 2023-10-09: 10 mL

## 2023-10-09 MED ORDER — ONDANSETRON HCL 4 MG/2ML IJ SOLN: 4.0000 mg | Freq: Four times a day (QID) | INTRAMUSCULAR | Status: DC | PRN

## 2023-10-09 MED ORDER — HEPARIN SODIUM (PORCINE) 1000 UNIT/ML IJ SOLN
INTRAMUSCULAR | Status: AC
  Filled 2023-10-09: qty 10

## 2023-10-09 MED ORDER — HYDRALAZINE HCL 20 MG/ML IJ SOLN: 5.0000 mg | INTRAMUSCULAR | Status: DC | PRN

## 2023-10-09 MED ORDER — ONDANSETRON HCL 4 MG/2ML IJ SOLN
INTRAMUSCULAR | Status: AC
  Filled 2023-10-09: qty 2

## 2023-10-09 MED ORDER — SODIUM CHLORIDE 0.9 % IV SOLN: INTRAVENOUS | Status: DC

## 2023-10-09 SURGICAL SUPPLY — 11 items
CATH OMNI FLUSH 5F 65CM (CATHETERS) IMPLANT
CLOSURE MYNX CONTROL 5F (Vascular Products) IMPLANT
COVER DOME SNAP 22 D (MISCELLANEOUS) IMPLANT
GLIDEWIRE ADV .035X260CM (WIRE) IMPLANT
KIT MICROPUNCTURE NIT STIFF (SHEATH) IMPLANT
KIT PV (KITS) ×1 IMPLANT
KIT SYRINGE INJ CVI SPIKEX1 (MISCELLANEOUS) IMPLANT
SET ATX-X65L (MISCELLANEOUS) IMPLANT
SHEATH PINNACLE 5F 10CM (SHEATH) IMPLANT
SHEATH PROBE COVER 6X72 (BAG) IMPLANT
WIRE BENTSON .035X145CM (WIRE) IMPLANT

## 2023-10-09 NOTE — Discharge Instructions (Signed)
 Femoral Site Care The following information offers guidance on how to care for yourself after your procedure. Your health care provider may also give you more specific instructions. If you have problems or questions, contact your health care provider. What can I expect after the procedure? After the procedure, it is common to have bruising and tenderness at the incision site. This usually fades within 1-2 weeks. Follow these instructions at home: Incision site care  Follow instructions from your health care provider about how to take care of your incision site. Make sure you: Wash your hands with soap and water for at least 20 seconds before and after you change your bandage (dressing). If soap and water are not available, use hand sanitizer. Remove your dressing in 24 hours. Leave stitches (sutures), skin glue, or adhesive strips in place. These skin closures may need to stay in place for 2 weeks or longer. If adhesive strip edges start to loosen and curl up, you may trim the loose edges. Do not remove adhesive strips completely unless your health care provider tells you to do that. Do not take baths, swim, or use a hot tub for at least 1 week. You may shower 24 hours after the procedure or as told by your health care provider. Gently wash the incision site with plain soap and water. Pat the area dry with a clean towel. Do not rub the site. This may cause bleeding. Do not apply powder or lotion to the site. Keep the site clean and dry. Check your femoral site every day for signs of infection. Check for: Redness, swelling, or pain. Fluid or blood. Warmth. Pus or a bad smell. Activity If you were given a sedative during the procedure, it can affect you for several hours. Do not drive or operate machinery until your health care provider says that it is safe. Rest as told by your health care provider. Avoid sitting for a long time without moving. Get up to take short walks every 1-2 hours. This  is important to improve blood flow and breathing. Ask for help if you feel weak or unsteady. Return to your normal activities as told by your health care provider. Ask your health care provider what activities are safe for you and when you can return to work. Avoid activities that take a lot of effort for the first 2-3 days after your procedure, or as long as directed. Do not lift anything that is heavier than 10 lb (4.5 kg), or the limit that you are told, until your health care provider says that it is safe. General instructions Take over-the-counter and prescription medicines only as told by your health care provider. If you will be going home right after the procedure, plan to have a responsible adult care for you for the time you are told. This is important. Keep all follow-up visits. This is important. Contact a health care provider if: You have a fever or chills. You have any of these signs of infection at your incision site: Redness, swelling, or pain. Fluid or blood. Warmth. Pus or a bad smell. Get help right away if: The incision area swells very fast. The incision area is bleeding, and the bleeding does not stop when you hold steady pressure on the area. Your leg or foot becomes pale, cool, tingly, or numb. These symptoms may represent a serious problem that is an emergency. Do not wait to see if the symptoms will go away. Get medical help right away. Call your local emergency  services (911 in the [place] Do not drive yourself to the hospital. Summary After the procedure, it is common to have bruising and tenderness that fade within 1-2 weeks. Check your femoral site every day for signs of infection. Do not lift anything that is heavier than 10 lb (4.5 kg), or the limit that you are told, until your health care provider says that it is safe. Get help right away if the incision area swells very fast, you have bleeding at the incision area that does not stop, or your leg or foot  becomes pale, cool, or numb. This information is not intended to replace advice given to you by your health care provider. Make sure you discuss any questions you have with your health care provider. Document Revised: 11/11/2020 Document Reviewed: 04/12/2020 Elsevier Patient Education  2024 [employer].

## 2023-10-09 NOTE — Op Note (Addendum)
 Patient name: Johnny [name] MRN: [id number] DOB: [birth year] Sex: male  10/09/2023 Pre-operative Diagnosis: Disabling claudication Post-operative diagnosis:  Same Surgeon:  Norman GORMAN Serve, MD Procedure Performed:  Ultrasound-guided access of left common femoral artery Second-order cannulation of right external iliac CO2 Aortogram and bilateral lower extremity angiogram Mynx closure of left common femoral artery 32 minutes of moderate sedation with fentanyl  and Versed   Indications: Johnny [name] is a 78 year old male with a history of PAD and claudication.  I have been seeing him in follow-up over the last year or so.  And his claudication is slowly worsening.  He has been on best medical therapy with aspirin , statin and smoking cessation.  We discussed the risk benefits of angiogram with intervention and he elected to proceed.  Findings:  Widely patent aorta and bilateral renal arteries.  Widely patent iliac systems bilaterally.  Right common femoral and profunda appear widely patent.  There is a flush occlusion of the right SFA.  The distal SFA reconstitutes by a large profunda collateral.  The right popliteal is patent with an approximate 60% stenosis behind the knee.  There is one-vessel runoff via the peroneal which reconstitutes the PT on the foot.  Left common femoral artery has some moderate calcific disease.  The profunda and SFA are patent.  The SFA has multifocal disease throughout with a proximal stenosis of about 50%.  There is a distal adductor canal stenosis that is severe greater than 90%.  The popliteal artery is patent.  The AT is patent but shortly occludes after the turn and then is reconstituted distally by collateral.  The peroneal and PT are chronically occluded.  The peroneal is reconstituted by collaterals.   Procedure:  The patient was identified in the holding area and taken to the cath lab  The patient was then placed supine on the table and prepped and draped in the  usual sterile fashion.  A time out was called.  Ultrasound was used to evaluate the left common femoral artery.  It was patent .  A digital ultrasound image was acquired.  A micropuncture needle was used to access the left common femoral artery under ultrasound guidance.  An 018 wire was advanced without resistance and a micropuncture sheath was placed.  The 018 wire was removed and a benson wire was placed.  The micropuncture sheath was exchanged for a 5 french sheath.  An omniflush catheter was advanced over the wire to the level of L-1.  An abdominal angiogram was obtained.  Next, using the omniflush catheter and a glide advantage wire, the aortic bifurcation was crossed and the catheter was placed into theright external iliac artery and right runoff was obtained. This demonstrated the above findings.  right runoff was performed via retrograde sheath injections which demonstrated the above findings.  Contrast: 20 cc, 450cc CO2 Sedation: 32 min  Impression: Right: Flush occlusion of the SFA with reconstitution of the distal SFA, severe tibial disease with one-vessel runoff via the peroneal that reconstitutes the PT on the foot  Left: SFA patent but with severe stenosis at the adductor canal, severe tibial disease with occlusions of all 3 and reconstitution of the distal AT  Given that he is not claudicant we will continue with best medical management and plan for follow-up in 3 months to rediscuss his symptoms as he would need a bypass on the right and a high risk endovascular procedure on the left which is not indicated unless he has symptoms of CL TI  Norman GORMAN Serve MD [facility] Office: [phone]

## 2023-10-09 NOTE — Interval H&P Note (Signed)
 History and Physical Interval Note:  10/09/2023 8:42 AM  Johnny [name]  has presented today for surgery, with the diagnosis of atherosclerosis bilaterial.  The various methods of treatment have been discussed with the patient and family. After consideration of risks, benefits and other options for treatment, the patient has consented to  Procedure(s): ABDOMINAL AORTOGRAM W/LOWER EXTREMITY (N/A) Lower Extremity Angiography (N/A) as a surgical intervention.  The patient's history has been reviewed, patient examined, no change in status, stable for surgery.  I have reviewed the patient's chart and labs.  Questions were answered to the patient's satisfaction.     Norman GORMAN Serve

## 2023-10-12 MED FILL — Lidocaine HCl Local Preservative Free (PF) Inj 1%: INTRAMUSCULAR | Qty: 30 | Status: AC

## 2023-10-12 MED FILL — Clopidogrel Bisulfate Tab 300 MG (Base Equiv): ORAL | Qty: 1 | Status: AC

## 2023-10-16 ENCOUNTER — Other Ambulatory Visit: Payer: Self-pay | Admitting: Internal Medicine

## 2023-10-16 DIAGNOSIS — E1142 Type 2 diabetes mellitus with diabetic polyneuropathy: Secondary | ICD-10-CM

## 2023-10-17 ENCOUNTER — Other Ambulatory Visit: Payer: Self-pay | Admitting: Internal Medicine

## 2023-10-17 ENCOUNTER — Other Ambulatory Visit: Payer: Self-pay

## 2023-10-18 ENCOUNTER — Other Ambulatory Visit: Payer: Self-pay

## 2023-10-18 DIAGNOSIS — E1142 Type 2 diabetes mellitus with diabetic polyneuropathy: Secondary | ICD-10-CM

## 2023-10-18 MED ORDER — DROPSAFE ALCOHOL PREP 70 % PADS: MEDICATED_PAD | 3 refills | Status: DC

## 2023-10-18 MED ORDER — GLIPIZIDE 10 MG PO TABS: 10.0000 mg | ORAL_TABLET | Freq: Two times a day (BID) | ORAL | 3 refills | Status: DC

## 2023-10-23 ENCOUNTER — Telehealth: Payer: Self-pay

## 2023-10-23 NOTE — Telephone Encounter (Signed)
 Spouse called to ask if anything was done to address the patient's symptoms on 10/09/23.   Mrs. Walls was told that the procedure was diagnostic with no intervention.   Excerpt from MD note:  Given that he is not claudicant we will continue with best medical management and plan for follow-up in 3 months to rediscuss his symptoms as he would need a bypass on the right and a high risk endovascular procedure on the left which is not indicated unless he has symptoms of CL TI.  The spouse was advised that patient should continue to walk as tolerated and that if his current symptoms become worse or unbearable or if he develops rest pain, wounds, or change in limb temperature before his next appointment (01/12/2024) to call the office.  The spouse verbalized understanding of these instructions.

## 2023-10-24 ENCOUNTER — Telehealth: Payer: Self-pay | Admitting: Internal Medicine

## 2023-10-24 NOTE — Telephone Encounter (Unsigned)
 Copied from CRM #[id number]. Topic: Clinical - Medication Refill >> Oct 24, 2023  3:03 PM Deleta S wrote: Medication: nitroGLYCERIN  (NITROSTAT ) 0.4 MG SL tablet  Has the patient contacted their pharmacy? Yes (Agent: If no, request that the patient contact the pharmacy for the refill. If patient does not wish to contact the pharmacy document the reason why and proceed with request.) (Agent: If yes, when and what did the pharmacy advise?)  This is the patient's preferred pharmacy:   [facility] - [place], [place] - [address] [place] [address] Phone: [phone] Fax: [phone]  Is this the correct pharmacy for this prescription? Yes If no, delete pharmacy and type the correct one.   Has the prescription been filled recently? Yes  Is the patient out of the medication? Not sure  Has the patient been seen for an appointment in the last year OR does the patient have an upcoming appointment? Yes  Can we respond through MyChart? No  Agent: Please be advised that Rx refills may take up to 3 business days. We ask that you follow-up with your pharmacy.

## 2023-10-27 MED ORDER — NITROGLYCERIN 0.4 MG SL SUBL: 0.4000 mg | SUBLINGUAL_TABLET | SUBLINGUAL | 3 refills | Status: DC | PRN

## 2023-11-01 ENCOUNTER — Encounter: Payer: Self-pay | Admitting: Internal Medicine

## 2023-11-01 ENCOUNTER — Ambulatory Visit: Admitting: Internal Medicine

## 2023-11-01 VITALS — BP 128/66 | HR 81 | Ht 71.0 in | Wt 222.0 lb

## 2023-11-01 DIAGNOSIS — E1122 Type 2 diabetes mellitus with diabetic chronic kidney disease: Secondary | ICD-10-CM

## 2023-11-01 DIAGNOSIS — E1142 Type 2 diabetes mellitus with diabetic polyneuropathy: Secondary | ICD-10-CM | POA: Diagnosis not present

## 2023-11-01 DIAGNOSIS — N1832 Chronic kidney disease, stage 3b: Secondary | ICD-10-CM

## 2023-11-01 DIAGNOSIS — Z7984 Long term (current) use of oral hypoglycemic drugs: Secondary | ICD-10-CM

## 2023-11-01 DIAGNOSIS — E1159 Type 2 diabetes mellitus with other circulatory complications: Secondary | ICD-10-CM | POA: Diagnosis not present

## 2023-11-01 LAB — POCT GLYCOSYLATED HEMOGLOBIN (HGB A1C): Hemoglobin A1C: 8.3 % — AB (ref 4.0–5.6)

## 2023-11-01 MED ORDER — GLIPIZIDE 10 MG PO TABS: ORAL_TABLET | ORAL | 3 refills | Status: AC

## 2023-11-01 NOTE — Progress Notes (Signed)
 Name: Johnny [name]  Age/ Sex: 78 y.o., male   MRN/ DOB: [id number], 1945/03/20     PCP: Johnny Karlynn GAILS, MD   Reason for Endocrinology Evaluation: Type 2 Diabetes Mellitus  Initial Endocrine Consultative Visit: 09/15/2016    PATIENT IDENTIFIER: [name] is a 78 y.o. male with a past medical history of T2DM, CAD, HTN and dyslipidemia . The patient has followed with Endocrinology clinic since 09/15/2016 for consultative assistance with management of his diabetes.  DIABETIC HISTORY:  Johnny [name] was diagnosed with DM 1999, has been on oral glycemic agents since his dx . His hemoglobin A1c has ranged from 6.9% in 2021, peaking at 9.3% in 2022.  Transitioned from Dr. Kassie 08/2021   On his initial visit with me in June 2023 ,he had an A1c of 7.0%,   he was on metformin , repaglinide , bromocriptine , and Rybelsus .  Patient was not taking repaglinide   (unknown reason ), we stopped bromocriptine ,  increased Rybelsus , but the patient contacted the office stating that it is cost prohibitive.  So we started glipizide  in June 2023  Stopped metformin  07/2022 due to low GFR  I attempted to prescribe Rybelsus  2024 but this was cost prohibitive  SUBJECTIVE:   During the last visit (07/28/2023): A1c 8.2%    Today (11/01/2023): Johnny [name] is here for a follow up on diabetes management.  He is accompanied by his spouse today.  He checks his blood sugars 1-2 times daily.  No meter today   Patient continues to follow-up with vascular surgery for peripheral artery disease and claudication.  He had a recent angiogram He was seen by podiatry 09/05/2023 Patient follows with cardiology, limited treatment options due to CKD He follows with nephrology    Denies nausea Has chronic constipation but no diarrhea    HOME DIABETES REGIMEN:  Glipizide  10 mg, 1 tab BID Jardiance  25 mg daily       Statin: yes ACE-I/ARB: yes    METER DOWNLOAD SUMMARY: n/a    DIABETIC  COMPLICATIONS: Microvascular complications:  CKD III, neuropathy Denies: retinopathy Last Eye Exam: Completed 04/08/2022  Macrovascular complications:  CAD ( S/P CABG ) Denies:  CVA, PVD   HISTORY:  Past Medical History:  Past Medical History:  Diagnosis Date   Arthritis    At risk for sleep apnea    STOP-BANG=  6   SENT TO PCP 05-21-2013   Benign positional vertigo    Benign prostatic hypertrophy    Blood transfusion without reported diagnosis 2000   CAD (coronary artery disease)    CARDIOLOGIST--  DR Johnny [name]   CHF (congestive heart failure) (HCC)    Chronic ischemic heart disease, unspecified    ED (erectile dysfunction) of organic origin    GERD (gastroesophageal reflux disease)    History of CHF (congestive heart failure)    systolic   HTN (hypertension)    Hyperlipidemia    Peripheral vascular disease (HCC)    Tubular adenoma of colon 12/2009   Type 2 diabetes mellitus (HCC)    Wears glasses    Past Surgical History:  Past Surgical History:  Procedure Laterality Date   ABDOMINAL AORTOGRAM W/LOWER EXTREMITY N/A 10/09/2023   Procedure: ABDOMINAL AORTOGRAM W/LOWER EXTREMITY;  Surgeon: Johnny Norman RAMAN, MD;  Location: [facility];  Service: Cardiovascular;  Laterality: N/A;   CARDIAC CATHETERIZATION  03-03-2000  dr Johnny taylor   normal lvsf/  two-vessel cad significant complex stenosis at distal left main   CARDIOVASCULAR STRESS TEST  08-09-2010  DR Johnny [name]   normal lexiscan  nuclear study/  no ischemia/  normal lvf/  ef 54%   CATARACT EXTRACTION W/ INTRAOCULAR LENS IMPLANT Right 03/07/2009   COLONOSCOPY  10/09/2020   12/30/2009   CORONARY ARTERY BYPASS GRAFT  03-06-2000   DR Johnny [name]   third vessel   LOWER EXTREMITY ANGIOGRAPHY N/A 10/09/2023   Procedure: Lower Extremity Angiography;  Surgeon: Johnny Norman RAMAN, MD;  Location: [facility] CV [facility];  Service: Cardiovascular;  Laterality: N/A;   PENILE PROSTHESIS IMPLANT  02/08/2010   COLOPLAST 3-PIECE  INFLATABLE   PENILE PROSTHESIS IMPLANT N/A 05/27/2013   Procedure: CYSTO REMOVAL OF PENILE PROSTHESIS;  Surgeon: Johnny C Ottelin, MD;  Location: [facility];  Service: Urology;  Laterality: N/A;   POLYPECTOMY  12/30/2009   +TA   TOTAL HIP ARTHROPLASTY  01/24/2011   Procedure: TOTAL HIP ARTHROPLASTY;  Surgeon: Johnny [name];  Location: [facility];  Service: Orthopedics;  Laterality: Left;   TOTAL HIP ARTHROPLASTY Right 01/30/2013   Procedure: RIGHT TOTAL HIP ARTHROPLASTY;  Surgeon: Johnny [name] Moan, MD;  Location: [facility];  Service: Orthopedics;  Laterality: Right;   Social History:  reports that he quit smoking about 53 years ago. His smoking use included cigarettes. He started smoking about 75 years ago. He has a 22.2 pack-year smoking history. He has been exposed to tobacco smoke. He has never used smokeless tobacco. He reports that he does not drink alcohol  and does not use drugs. Family History:  Family History  Problem Relation Age of Onset   Hypertension Mother    Diabetes Father    Cancer Sister 83       ovarian ca   Cancer - Other Brother        cancer all over   Coronary artery disease Other        1st degree male relative   Hypertension Other    Colon cancer Neg Hx    Stomach cancer Neg Hx    Esophageal cancer Neg Hx    Pancreatic cancer Neg Hx    Liver disease Neg Hx    Colon polyps Neg Hx    Rectal cancer Neg Hx      HOME MEDICATIONS: Allergies as of 11/01/2023       Reactions   Enalapril  Shortness Of Breath   Hydrocodone-acetaminophen  Nausea Only   Can take codein        Medication List        Accurate as of November 01, 2023  2:26 PM. If you have any questions, ask your nurse or doctor.          Accu-Chek Guide Me w/Device Kit Use as instructed to check blood sugars 1 times a day, fasting   Accu-Chek Guide Test test strip Generic drug: glucose blood 1 each by Other route daily in the afternoon. Use as instructed   accu-chek softclix  lancets Use twice daily as instructed. Dx: 250.00   albuterol  108 (90 Base) MCG/ACT inhaler Commonly known as: Ventolin  HFA INHALE 2 PUFFS INTO THE LUNGS EVERY 4 HOURS AS NEEDED FOR WHEEZING OR SHORTNESS OF BREATH   allopurinol  100 MG tablet Commonly known as: ZYLOPRIM  TAKE 1 TABLET EVERY DAY   aspirin  EC 81 MG tablet Take 2 tablets (162 mg total) by mouth daily. Swallow whole.   cholecalciferol 1000 units tablet Commonly known as: VITAMIN D Take 1,000 Units by mouth every morning.   cilostazol  50 MG tablet Commonly known as: PLETAL  Take 1  tablet (50 mg total) by mouth 2 (two) times daily.   colchicine  0.6 MG tablet Take 1 tablet (0.6 mg total) by mouth daily.   DropSafe Alcohol  Prep 70 % Pads USE AS DIRECTED   empagliflozin  25 MG Tabs tablet Commonly known as: Jardiance  Take 1 tablet (25 mg total) by mouth daily before breakfast.   finasteride  5 MG tablet Commonly known as: PROSCAR  TAKE 1 TABLET EVERY MORNING   fluticasone  furoate-vilanterol 100-25 MCG/INH Aepb Commonly known as: Breo Ellipta  Inhale 1 puff into the lungs daily. What changed:  when to take this reasons to take this   glipiZIDE  10 MG tablet Commonly known as: GLUCOTROL  Take 1 tablet (10 mg total) by mouth 2 (two) times daily before a meal.   irbesartan -hydrochlorothiazide  300-12.5 MG tablet Commonly known as: AVALIDE TAKE 1 TABLET EVERY DAY   lovastatin  40 MG tablet Commonly known as: MEVACOR  TAKE 1 TABLET EVERY MORNING   MULTI-VITAMIN GUMMIES PO Take 1 tablet by mouth daily.   nitroGLYCERIN  0.4 MG SL tablet Commonly known as: NITROSTAT  Place 1 tablet (0.4 mg total) under the tongue every 5 (five) minutes as needed for chest pain.   OneTouch Delica Plus Lancet33G Misc USE TWICE DAILY AS DIRECTED   Accu-Chek Softclix Lancets lancets Use as instructed to check blood sugars 1 times a day, fasting   pantoprazole  40 MG tablet Commonly known as: PROTONIX  TAKE 1 TABLET EVERY MORNING    sildenafil  100 MG tablet Commonly known as: Viagra  Take 0.5-1 tablets (50-100 mg total) by mouth daily as needed for erectile dysfunction.   tamsulosin  0.4 MG Caps capsule Commonly known as: FLOMAX  TAKE 1 CAPSULE EVERY DAY         OBJECTIVE:   Vital Signs: BP 128/66 (BP Location: Left Arm, Patient Position: Sitting, Cuff Size: Normal)   Pulse 81   Ht 5' 11 (1.803 m)   Wt 222 lb (100.7 kg)   SpO2 99%   BMI 30.96 kg/m   Wt Readings from Last 3 Encounters:  11/01/23 222 lb (100.7 kg)  10/09/23 220 lb (99.8 kg)  09/22/23 224 lb 6.4 oz (101.8 kg)     Exam: General: Pt appears well and is in NAD  Lungs: Clear with good BS bilat  Heart: RRR   Extremities: No pretibial edema   Neuro: MS is good with appropriate affect, pt is alert and Ox3    DM foot exam: 11/01/2023  The skin of the feet is intact without sores or ulcerations. The pedal pulses are undetectable The sensation is intact to a screening 5.07, 10 gram monofilament bilaterally     DATA REVIEWED:  Lab Results  Component Value Date   HGBA1C 8.3 (A) 11/01/2023   HGBA1C 8.2 (H) 07/26/2023   HGBA1C 7.6 (A) 03/29/2023    Latest Reference Range & Units 07/26/23 16:05  Sodium 135 - 145 mEq/L 136  Potassium 3.5 - 5.1 mEq/L 4.3  Chloride 96 - 112 mEq/L 102  CO2 19 - 32 mEq/L 26  Glucose 70 - 99 mg/dL 852 (H)  BUN 6 - 23 mg/dL 32 (H)  Creatinine 9.59 - 1.50 mg/dL 7.99 (H)  Calcium 8.4 - 10.5 mg/dL 9.0  Alkaline Phosphatase 39 - 117 U/L 88  Albumin 3.5 - 5.2 g/dL 4.2  AST 0 - 37 U/L 12  ALT 0 - 53 U/L 12  Total Protein 6.0 - 8.3 g/dL 7.1  Total Bilirubin 0.2 - 1.2 mg/dL 0.4  GFR >39.99 mL/min 31.32 (L)     ASSESSMENT /  PLAN / RECOMMENDATIONS:   1) Type 2 Diabetes Mellitus, Poorly controlled, With neuropathic, CKD III  and macrovascular  complications - Most recent A1c of 8.3 %. Goal A1c < 7.0 %.    -A1c continues to trend upwards despite increasing glipizide  on the last visit -We had to stop  metformin  due to a GFR <35 -Rybelsus  historically has been cost prohibitive, on the last visit we discussed restarting Rybelsus , but the patient felt that he was on plenty of medication - Patient has been taking glipizide  after supper, we discussed the importance of taking glipizide  15-20 minutes before breakfast and supper, we discussed mechanism of action of glipizide  - I will increase morning dose of glipizide  as below   MEDICATIONS:  Continue Jardiance  25 mg daily Increase glipizide  10 mg, 2 tab before Breakfast and 1 tab Before Supper    EDUCATION / INSTRUCTIONS: BG monitoring instructions: Patient is instructed to check his blood sugars 1 times a day, fasting. Call [facility] if: BG persistently < 70  I reviewed the Rule of 15 for the treatment of hypoglycemia in detail with the patient. Literature supplied.   2) Diabetic complications:  Eye: Does not have known diabetic retinopathy.  Neuro/ Feet: Does  have known diabetic peripheral neuropathy .  Renal: Patient does have known baseline CKD. He   is  on an ACEI/ARB at present.    3)CKD III:  -GFR has been fluctuating -Patient on ARB - Follows with Nephrology   F/U in 4 months   I spent 25 minutes preparing to see the patient by review of recent labs, imaging and procedures, obtaining and reviewing separately obtained history, communicating with the patient/family or caregiver, ordering medications, tests or procedures, and documenting clinical information in the EHR including the differential Dx, treatment, and any further evaluation and other management    Signed electronically by: Stefano Redgie Butts, MD  [facility]  [facility] [address] [place], [place] [address] Phone: [phone] FAX: [phone]   CC: Johnny Karlynn GAILS, MD [address] [place] [address] Phone: [phone]  Fax: [phone]  Return to Endocrinology clinic as  below: Future Appointments  Date Time Provider Department Center  11/23/2023  2:20 PM Plotnikov, Karlynn GAILS, MD [facility]  12/06/2023  1:15 PM Loreda Hacker, DPM [facility]  01/12/2024 11:00 AM [facility]  01/12/2024 12:20 PM Johnny Norman RAMAN, MD [facility]  06/24/2024  1:10 PM [facility]LLNESS VISIT [facility]  06/24/2024  2:00 PM Plotnikov, Karlynn GAILS, MD [facility]

## 2023-11-01 NOTE — Patient Instructions (Addendum)
  Take Glipizide  10 mg , 2 tablets before Breakfast and 1 tablet before Supper  Jardiance  25 mg , 1 tablet before Breakfast      HOW TO TREAT LOW BLOOD SUGARS (Blood sugar LESS THAN 70 MG/DL) Please follow the RULE OF 15 for the treatment of hypoglycemia treatment (when your (blood sugars are less than 70 mg/dL)   STEP 1: Take 15 grams of carbohydrates when your blood sugar is low, which includes:  3-4 GLUCOSE TABS  OR 3-4 OZ OF JUICE OR REGULAR SODA OR ONE TUBE OF GLUCOSE GEL    STEP 2: RECHECK blood sugar in 15 MINUTES STEP 3: If your blood sugar is still low at the 15 minute recheck --> then, go back to STEP 1 and treat AGAIN with another 15 grams of carbohydrates.

## 2023-11-23 ENCOUNTER — Encounter: Payer: Self-pay | Admitting: Internal Medicine

## 2023-11-23 ENCOUNTER — Ambulatory Visit: Admitting: Internal Medicine

## 2023-11-23 VITALS — BP 104/50 | HR 72 | Temp 98.2°F | Ht 69.0 in | Wt 223.2 lb

## 2023-11-23 DIAGNOSIS — E1122 Type 2 diabetes mellitus with diabetic chronic kidney disease: Secondary | ICD-10-CM | POA: Diagnosis not present

## 2023-11-23 DIAGNOSIS — I2510 Atherosclerotic heart disease of native coronary artery without angina pectoris: Secondary | ICD-10-CM | POA: Diagnosis not present

## 2023-11-23 DIAGNOSIS — N1832 Chronic kidney disease, stage 3b: Secondary | ICD-10-CM

## 2023-11-23 DIAGNOSIS — E1159 Type 2 diabetes mellitus with other circulatory complications: Secondary | ICD-10-CM

## 2023-11-23 DIAGNOSIS — I10 Essential (primary) hypertension: Secondary | ICD-10-CM | POA: Diagnosis not present

## 2023-11-23 MED ORDER — ACCU-CHEK GUIDE ME W/DEVICE KIT: PACK | 1 refills | Status: DC

## 2023-11-23 MED ORDER — LANCETS MISC. MISC
1.0000 | Freq: Three times a day (TID) | 0 refills | Status: AC
Start: 2023-11-23 — End: 2023-12-23

## 2023-11-23 MED ORDER — BLOOD GLUCOSE TEST VI STRP
1.0000 | ORAL_STRIP | Freq: Three times a day (TID) | 0 refills | Status: AC
Start: 2023-11-23 — End: 2023-12-23

## 2023-11-23 MED ORDER — LANCET DEVICE MISC
1.0000 | Freq: Three times a day (TID) | 0 refills | Status: AC
Start: 2023-11-23 — End: 2023-12-23

## 2023-11-23 MED ORDER — BLOOD GLUCOSE MONITORING SUPPL DEVI: 1.0000 | Freq: Three times a day (TID) | 0 refills | Status: DC

## 2023-11-23 NOTE — Progress Notes (Signed)
 But not more.  Likely minimal probably yeah  Subjective:  Patient ID: Johnny [name], male    DOB: 1946/01/11  Age: 78 y.o. MRN: [id number]  CC: Follow-up (Patient states nothing to discuss. Patient needs new accu check device. )   HPI Johnny [name] presents for DM, HTN, CAD  Outpatient Medications Prior to Visit  Medication Sig Dispense Refill   Accu-Chek Softclix Lancets lancets Use as instructed to check blood sugars 1 times a day, fasting 100 each 12   albuterol  (VENTOLIN  HFA) 108 (90 Base) MCG/ACT inhaler INHALE 2 PUFFS INTO THE LUNGS EVERY 4 HOURS AS NEEDED FOR WHEEZING OR SHORTNESS OF BREATH 18 g 5   Alcohol  Swabs (DROPSAFE ALCOHOL  PREP) 70 % PADS USE AS DIRECTED 100 each 3   allopurinol  (ZYLOPRIM ) 100 MG tablet TAKE 1 TABLET EVERY DAY 90 tablet 3   aspirin  81 MG EC tablet Take 2 tablets (162 mg total) by mouth daily. Swallow whole. 100 tablet 3   Blood Glucose Monitoring Suppl (ACCU-CHEK GUIDE ME) w/Device KIT Use as instructed to check blood sugars 1 times a day, fasting 1 kit 0   cholecalciferol (VITAMIN D) 1000 UNITS tablet Take 1,000 Units by mouth every morning.     cilostazol  (PLETAL ) 50 MG tablet Take 1 tablet (50 mg total) by mouth 2 (two) times daily. 180 tablet 2   colchicine  0.6 MG tablet Take 1 tablet (0.6 mg total) by mouth daily. 90 tablet 3   empagliflozin  (JARDIANCE ) 25 MG TABS tablet Take 1 tablet (25 mg total) by mouth daily before breakfast. 90 tablet 3   finasteride  (PROSCAR ) 5 MG tablet TAKE 1 TABLET EVERY MORNING 90 tablet 3   fluticasone  furoate-vilanterol (BREO ELLIPTA ) 100-25 MCG/INH AEPB Inhale 1 puff into the lungs daily. (Patient taking differently: Inhale 1 puff into the lungs daily as needed (cough).) 1 each 5   glipiZIDE  (GLUCOTROL ) 10 MG tablet Take 2 tablets (20 mg total) by mouth daily before breakfast AND 1 tablet (10 mg total) daily before supper. 270 tablet 3   glucose blood (ACCU-CHEK GUIDE TEST) test strip 1 each by Other route daily in the  afternoon. Use as instructed 100 each 12   irbesartan -hydrochlorothiazide  (AVALIDE) 300-12.5 MG tablet TAKE 1 TABLET EVERY DAY 90 tablet 3   Lancet Devices (ACCU-CHEK SOFTCLIX) lancets Use twice daily as instructed. Dx: 250.00 1 each 3   Lancets (ONETOUCH DELICA PLUS LANCET33G) MISC USE TWICE DAILY AS DIRECTED 200 each 3   lovastatin  (MEVACOR ) 40 MG tablet TAKE 1 TABLET EVERY MORNING 90 tablet 3   Multiple Vitamins-Minerals (MULTI-VITAMIN GUMMIES PO) Take 1 tablet by mouth daily.     nitroGLYCERIN  (NITROSTAT ) 0.4 MG SL tablet Place 1 tablet (0.4 mg total) under the tongue every 5 (five) minutes as needed for chest pain. 25 tablet 3   pantoprazole  (PROTONIX ) 40 MG tablet TAKE 1 TABLET EVERY MORNING 90 tablet 3   sildenafil  (VIAGRA ) 100 MG tablet Take 0.5-1 tablets (50-100 mg total) by mouth daily as needed for erectile dysfunction. 12 tablet 11   tamsulosin  (FLOMAX ) 0.4 MG CAPS capsule TAKE 1 CAPSULE EVERY DAY 90 capsule 3   No facility-administered medications prior to visit.    ROS: Review of Systems  Constitutional:  Negative for appetite change, fatigue and unexpected weight change.  HENT:  Negative for congestion, nosebleeds, sneezing, sore throat and trouble swallowing.   Eyes:  Negative for itching and visual disturbance.  Respiratory:  Negative for cough.   Cardiovascular:  Negative for chest  pain, palpitations and leg swelling.  Gastrointestinal:  Negative for abdominal distention, blood in stool, diarrhea and nausea.  Genitourinary:  Negative for frequency and hematuria.  Musculoskeletal:  Positive for arthralgias, back pain and gait problem. Negative for joint swelling and neck pain.  Skin:  Negative for rash.  Neurological:  Negative for dizziness, tremors, speech difficulty and weakness.  Psychiatric/Behavioral:  Negative for agitation, dysphoric mood and sleep disturbance. The patient is not nervous/anxious.     Objective:  BP (!) 104/50   Pulse 72   Temp 98.2 F (36.8  C) (Oral)   Ht 5' 9 (1.753 m)   Wt 223 lb 3.2 oz (101.2 kg)   SpO2 98%   BMI 32.96 kg/m   BP Readings from Last 3 Encounters:  11/23/23 (!) 104/50  11/01/23 128/66  10/09/23 (!) 144/59    Wt Readings from Last 3 Encounters:  11/23/23 223 lb 3.2 oz (101.2 kg)  11/01/23 222 lb (100.7 kg)  10/09/23 220 lb (99.8 kg)    Physical Exam Constitutional:      General: He is not in acute distress.    Appearance: He is well-developed. He is obese.     Comments: NAD  Eyes:     Conjunctiva/sclera: Conjunctivae normal.     Pupils: Pupils are equal, round, and reactive to light.  Neck:     Thyroid : No thyromegaly.     Vascular: No JVD.  Cardiovascular:     Rate and Rhythm: Normal rate and regular rhythm.     Heart sounds: Normal heart sounds. No murmur heard.    No friction rub. No gallop.  Pulmonary:     Effort: Pulmonary effort is normal. No respiratory distress.     Breath sounds: Normal breath sounds. No wheezing or rales.  Chest:     Chest Wish: No tenderness.  Abdominal:     General: Bowel sounds are normal. There is no distension.     Palpations: Abdomen is soft. There is no mass.     Tenderness: There is no abdominal tenderness. There is no guarding or rebound.  Musculoskeletal:        General: No tenderness. Normal range of motion.     Cervical back: Normal range of motion.  Lymphadenopathy:     Cervical: No cervical adenopathy.  Skin:    General: Skin is warm and dry.     Findings: No rash.  Neurological:     Mental Status: He is alert and oriented to person, place, and time.     Cranial Nerves: No cranial nerve deficit.     Motor: No abnormal muscle tone.     Coordination: Coordination normal.     Gait: Gait normal.     Deep Tendon Reflexes: Reflexes are normal and symmetric.  Psychiatric:        Behavior: Behavior normal.        Thought Content: Thought content normal.        Judgment: Judgment normal.     Lab Results  Component Value Date   WBC 7.8  07/26/2023   HGB 12.6 (L) 10/09/2023   HCT 37.0 (L) 10/09/2023   PLT 253.0 07/26/2023   GLUCOSE 186 (H) 10/09/2023   CHOL 200 06/07/2022   TRIG 299.0 (H) 06/07/2022   HDL 34.60 (L) 06/07/2022   LDLDIRECT 93.0 06/07/2022   LDLCALC 74 11/29/2013   ALT 12 07/26/2023   AST 12 07/26/2023   NA 138 10/09/2023   K 4.3 10/09/2023   CL 106 10/09/2023  CREATININE 2.20 (H) 10/09/2023   BUN 33 (H) 10/09/2023   CO2 26 07/26/2023   TSH 8.88 (H) 07/26/2023   PSA 1.77 07/26/2023   INR 1.15 01/21/2013   HGBA1C 8.3 (A) 11/01/2023    PERIPHERAL VASCULAR CATHETERIZATION Addendum Date: 10/09/2023   Patient name: Johnny [name]          MRN: [id number]        DOB: [birth year]          Sex: male  10/09/2023 Pre-operative Diagnosis: Disabling claudication Post-operative diagnosis:  Same Surgeon:  Norman GORMAN Serve, MD Procedure Performed:  Ultrasound-guided access of left common femoral artery Second-order cannulation of right external iliac CO2 Aortogram and bilateral lower extremity angiogram Mynx closure of left common femoral artery 32 minutes of moderate sedation with fentanyl  and Versed   Indications: Mr. Celona is a 78 year old male with a history of PAD and claudication.  I have been seeing him in follow-up over the last year or so.  And his claudication is slowly worsening.  He has been on best medical therapy with aspirin , statin and smoking cessation.  We discussed the risk benefits of angiogram with intervention and he elected to proceed.  Findings: Widely patent aorta and bilateral renal arteries.  Widely patent iliac systems bilaterally.  Right common femoral and profunda appear widely patent.  There is a flush occlusion of the right SFA.  The distal SFA reconstitutes by a large profunda collateral.  The right popliteal is patent with an approximate 60% stenosis behind the knee.  There is one-vessel runoff via the peroneal which reconstitutes the PT on the foot.  Left common femoral artery has some moderate  calcific disease.  The profunda and SFA are patent.  The SFA has multifocal disease throughout with a proximal stenosis of about 50%.  There is a distal adductor canal stenosis that is severe greater than 90%.  The popliteal artery is patent.  The AT is patent but shortly occludes after the turn and then is reconstituted distally by collateral.  The peroneal and PT are chronically occluded.  The peroneal is reconstituted by collaterals.             Procedure:  The patient was identified in the holding area and taken to the cath lab  The patient was then placed supine on the table and prepped and draped in the usual sterile fashion.  A time out was called.  Ultrasound was used to evaluate the left common femoral artery.  It was patent .  A digital ultrasound image was acquired.  A micropuncture needle was used to access the left common femoral artery under ultrasound guidance.  An 018 wire was advanced without resistance and a micropuncture sheath was placed.  The 018 wire was removed and a benson wire was placed.  The micropuncture sheath was exchanged for a 5 french sheath.  An omniflush catheter was advanced over the wire to the level of L-1.  An abdominal angiogram was obtained.  Next, using the omniflush catheter and a glide advantage wire, the aortic bifurcation was crossed and the catheter was placed into theright external iliac artery and right runoff was obtained. This demonstrated the above findings. right runoff was performed via retrograde sheath injections which demonstrated the above findings.  Contrast: 20 cc, 450cc CO2 Sedation: 32 min  Impression: Right: Flush occlusion of the SFA with reconstitution of the distal SFA, severe tibial disease with one-vessel runoff via the peroneal that reconstitutes the PT on the foot  Left: SFA patent but with severe stenosis at the adductor canal, severe tibial disease with occlusions of all 3 and reconstitution of the distal AT  Given that he is not claudicant we  will continue with best medical management and plan for follow-up in 3 months to rediscuss his symptoms as he would need a bypass on the right and a high risk endovascular procedure on the left which is not indicated unless he has symptoms of CL TI   Norman GORMAN Serve MD [facility] Office: [phone]  Result Date: 10/09/2023 Images from the original result were not included. Patient name: Johnny [name] MRN: [id number] DOB: June 30, 1945 Sex: male 10/09/2023 Pre-operative Diagnosis: Disabling claudication Post-operative diagnosis:  Same Surgeon:  Norman GORMAN Serve, MD Procedure Performed: Ultrasound-guided access of left common femoral artery Second-order cannulation of right external iliac Aortogram and bilateral lower extremity angiogram Mynx closure of left common femoral artery 32 minutes of moderate sedation with fentanyl  and Versed  Indications: Mr. Shammas is a 78 year old male with a history of PAD and claudication.  I have been seeing him in follow-up over the last year or so.  And his claudication is slowly worsening.  He has been on best medical therapy with aspirin , statin and smoking cessation.  We discussed the risk benefits of angiogram with intervention and he elected to proceed. Findings: Widely patent aorta and bilateral renal arteries.  Widely patent iliac systems bilaterally. Right common femoral and profunda appear widely patent.  There is a flush occlusion of the right SFA.  The distal SFA reconstitutes by a large profunda collateral.  The right popliteal is patent with an approximate 60% stenosis behind the knee.  There is one-vessel runoff via the peroneal which reconstitutes the PT on the foot. Left common femoral artery has some moderate calcific disease.  The profunda and SFA are patent.  The SFA has multifocal disease throughout with a proximal stenosis of about 50%.  There is a distal adductor canal stenosis that is severe greater than 90%.  The popliteal artery is  patent.  The AT is patent but shortly occludes after the turn and then is reconstituted distally by collateral.  The peroneal and PT are chronically occluded.  The peroneal is reconstituted by collaterals.  Procedure:  The patient was identified in the holding area and taken to the cath lab  The patient was then placed supine on the table and prepped and draped in the usual sterile fashion.  A time out was called.  Ultrasound was used to evaluate the left common femoral artery.  It was patent .  A digital ultrasound image was acquired.  A micropuncture needle was used to access the left common femoral artery under ultrasound guidance.  An 018 wire was advanced without resistance and a micropuncture sheath was placed.  The 018 wire was removed and a benson wire was placed.  The micropuncture sheath was exchanged for a 5 french sheath.  An omniflush catheter was advanced over the wire to the level of L-1.  An abdominal angiogram was obtained.  Next, using the omniflush catheter and a glide advantage wire, the aortic bifurcation was crossed and the catheter was placed into theright external iliac artery and right runoff was obtained. This demonstrated the above findings. right runoff was performed via retrograde sheath injections which demonstrated the above findings. Contrast: 20 cc Sedation: 32 min Impression: Right: Flush occlusion of the SFA with reconstitution of the distal SFA, severe tibial disease with one-vessel runoff  via the peroneal that reconstitutes the PT on the foot Left: SFA patent but with severe stenosis at the adductor canal, severe tibial disease with occlusions of all 3 and reconstitution of the distal AT Given that he is not claudicant we will continue with best medical management and plan for follow-up in 3 months to rediscuss his symptoms as he would need a bypass on the right and a high risk endovascular procedure on the left which is not indicated unless he has symptoms of CL TI Norman GORMAN Serve MD [facility] Office: [phone]    Assessment & Plan:   Problem List Items Addressed This Visit     CAD (coronary artery disease)   F/u w/Dr Court Cont on Losartan , Lovastatin , ASA      DM type 2 causing vascular disease (HCC) - Primary   Chronic Follow-up with Dr Sam Continue on metformin , Glipizide  Cont on Losartan  Rx for testing supplies was given      Essential hypertension   Chronic Cont on Irbesartan  HCT On Jardiance        Type 2 diabetes mellitus with stage 3b chronic kidney disease, without long-term current use of insulin  (HCC)   Hydrate well         Meds ordered this encounter  Medications   Blood Glucose Monitoring Suppl (ACCU-CHEK GUIDE ME) w/Device KIT    Sig: As directed    Dispense:  1 kit    Refill:  1   Blood Glucose Monitoring Suppl DEVI    Sig: 1 each by Does not apply route in the morning, at noon, and at bedtime. May substitute to any manufacturer covered by patient's insurance.    Dispense:  1 each    Refill:  0   Glucose Blood (BLOOD GLUCOSE TEST STRIPS) STRP    Sig: 1 each by In Vitro route in the morning, at noon, and at bedtime. May substitute to any manufacturer covered by patient's insurance.    Dispense:  100 strip    Refill:  0   Lancet Device MISC    Sig: 1 each by Does not apply route in the morning, at noon, and at bedtime. May substitute to any manufacturer covered by patient's insurance.    Dispense:  1 each    Refill:  0   Lancets Misc. MISC    Sig: 1 each by Does not apply route in the morning, at noon, and at bedtime. May substitute to any manufacturer covered by patient's insurance.    Dispense:  100 each    Refill:  0      Follow-up: Return in about 3 months (around 02/22/2024) for a follow-up visit.  Marolyn Noel, MD

## 2023-11-23 NOTE — Assessment & Plan Note (Signed)
 Hydrate well

## 2023-11-23 NOTE — Assessment & Plan Note (Signed)
Chronic Cont on Irbesartan HCT On Jardiance

## 2023-11-23 NOTE — Assessment & Plan Note (Addendum)
 Chronic Follow-up with Dr Sam Continue on metformin , Glipizide  Cont on Losartan  Rx for testing supplies was given

## 2023-11-23 NOTE — Assessment & Plan Note (Signed)
 F/u w/Dr Allyson Sabal Cont on Losartan, Lovastatin, ASA

## 2023-11-24 ENCOUNTER — Other Ambulatory Visit: Payer: Self-pay | Admitting: Vascular Surgery

## 2023-11-24 DIAGNOSIS — I739 Peripheral vascular disease, unspecified: Secondary | ICD-10-CM

## 2023-12-01 ENCOUNTER — Telehealth: Payer: Self-pay

## 2023-12-01 ENCOUNTER — Ambulatory Visit: Payer: Self-pay

## 2023-12-01 NOTE — Telephone Encounter (Signed)
 Patient wife left vm stating that patient penis has turned pink since he has been on Jardiance . Would like to know if this could be the Jardiance  and does he need to stop it.

## 2023-12-01 NOTE — Telephone Encounter (Signed)
 Patient spouse advised and will follow up with PCP

## 2023-12-01 NOTE — Telephone Encounter (Signed)
 FYI Only or Action Required?: Action required by provider: clinical question for provider and update on patient condition.  Patient was last seen in primary care on 11/23/2023 by Plotnikov, Karlynn GAILS, MD.  Called Nurse Triage reporting Medication Problem and Skin Discoloration.  Symptoms began about a month ago.  Interventions attempted: Prescription medications: empagliflozin  (JARDIANCE ) 25 MG.  Symptoms are: unchanged.  Triage Disposition: Call PCP Now  Patient/caregiver understands and will follow disposition?: Yes       Copied from CRM [id number]. Topic: Clinical - Red Word Triage >> Dec 01, 2023 11:50 AM Thersia BROCKS wrote: [facility] that prompted transfer to Nurse Triage: Patient wife, Angeline called in regarding patient, stated he has a concern , takes medication for his diabetes, stated he has heard that a side effects that is penis and gentials is discolored >> Dec 01, 2023 11:52 AM Thersia C wrote: empagliflozin  (JARDIANCE ) 25 MG TABS tablet  Reason for Disposition  [1] Caller has URGENT medicine question about med that primary care doctor (or NP/PA) or specialist prescribed AND [2] triager unable to answer question    Wife reports that pt has concerns for side effects that he saw on TV-- Rx commercial reports that discolored genitals is a possible side effect-- endorses penis is pink and it has never been that color in the past. Wife denies any gray/lack of color.  Wife has reached out to Endo and specialist advised to F/U with PCP re: concern. She reports that Endo advised to stop Rx until PCP can give additional guidance/evaluation.  Triager will forward encounter for Dr Marit 's office to review and advise if pt should continue taking Rx/come in for OV. Caregiver verbalized understanding and is expecting call back from office for next steps.  Answer Assessment - Initial Assessment Questions 1. NAME of MEDICINE: What medicine(s) are you calling about?     empagliflozin   (JARDIANCE ) 25 MG TABS 2. QUESTION: What is your question? (e.g., double dose of medicine, side effect)     Wife reports that pt has concerns for side effects that he saw on TV-- Rx commercial reports that discolored genitals is a possible side effect-- endorses penis is pink and it has never been that color in the past. Wife denies any gray/lack of color. Wife has reached out to Endo and specialist advised to reach out to PCP re: concern. 3. PRESCRIBER: Who prescribed the medicine? Reason: if prescribed by specialist, call should be referred to that group.     endo 4. SYMPTOMS: Do you have any symptoms? If Yes, ask: What symptoms are you having?  How bad are the symptoms (e.g., mild, moderate, severe)     Genital skin discoloration for about a month now 5. PREGNANCY:  Is there any chance that you are pregnant? When was your last menstrual period?     N/a  Protocols used: Medication Question Call-A-AH

## 2023-12-06 ENCOUNTER — Encounter: Payer: Self-pay | Admitting: Podiatry

## 2023-12-06 ENCOUNTER — Ambulatory Visit: Admitting: Podiatry

## 2023-12-06 DIAGNOSIS — E1151 Type 2 diabetes mellitus with diabetic peripheral angiopathy without gangrene: Secondary | ICD-10-CM | POA: Diagnosis not present

## 2023-12-06 DIAGNOSIS — M79674 Pain in right toe(s): Secondary | ICD-10-CM

## 2023-12-06 DIAGNOSIS — M79675 Pain in left toe(s): Secondary | ICD-10-CM

## 2023-12-06 NOTE — Progress Notes (Signed)
 This patient returns to my office for at risk foot care.  This patient requires this care by a professional since this patient will be at risk due to having diabetes with vascular disease. This patient is unable to cut nails himself since the patient cannot reach his nails.These nails are painful walking and wearing shoes.  He presents to the office with his wife.This patient presents for at risk foot care today.  General Appearance  Alert, conversant and in no acute stress.  Vascular  Dorsalis pedis and posterior tibial  pulses are weakly  palpable  bilaterally.  Capillary return is within normal limits  bilaterally. Temperature is within normal limits  bilaterally.  Neurologic  Senn-Weinstein monofilament wire test within normal limits  bilaterally. Muscle power within normal limits bilaterally.  Nails Thick disfigured discolored nails with subungual debris  from hallux to fifth toes bilaterally. No evidence of bacterial infection or drainage bilaterally.  Orthopedic  No limitations of motion  feet .  No crepitus or effusions noted.  No bony pathology or digital deformities noted.  Skin  normotropic skin with no porokeratosis noted bilaterally.  No signs of infections or ulcers noted.     Onychomycosis  Pain in right toes  Pain in left toes  Consent was obtained for treatment procedures.   Mechanical debridement of nails 1-5  bilaterally performed with a nail nipper.  Filed with dremel without incident.    Return office visit     3 months                 Told patient to return for periodic foot care and evaluation due to potential at risk complications.   Helane Gunther DPM

## 2023-12-28 NOTE — Telephone Encounter (Signed)
 It can cause discoloration if yeast infection.  Please keep return office visit or see me sooner.  Do not stop Jardiance  Thank you

## 2024-01-02 ENCOUNTER — Other Ambulatory Visit: Payer: Self-pay | Admitting: Internal Medicine

## 2024-01-10 NOTE — Progress Notes (Deleted)
 Patient ID: Johnny [name], male   DOB: Feb 12, 1946, 78 y.o.   MRN: [id number]  Reason for Consult: No chief complaint on file.   Referred by Plotnikov, Karlynn GAILS, MD  Subjective:     HPI Johnny [name] is a 78 y.o. male with PAD presenting for follow-up.  He is a claudicant and underwent bilateral lower extremity CO2 angiogram in August 2025.  This was diagnostic visit demonstrated a flush occlusion of the SFA on the right and a severe stenosis of the SFA on the left with severe tibial disease bilaterally.  At that time we discussed since he does not have rest pain or nonhealing wounds we will continue with medical management for now. Today he reports ***  Past Medical History:  Diagnosis Date   Arthritis    At risk for sleep apnea    STOP-BANG=  6   SENT TO PCP 05-21-2013   Benign positional vertigo    Benign prostatic hypertrophy    Blood transfusion without reported diagnosis 2000   CAD (coronary artery disease)    CARDIOLOGIST--  DR DANELLE [place]   CHF (congestive heart failure) (HCC)    Chronic ischemic heart disease, unspecified    ED (erectile dysfunction) of organic origin    GERD (gastroesophageal reflux disease)    History of CHF (congestive heart failure)    systolic   HTN (hypertension)    Hyperlipidemia    Peripheral vascular disease    Tubular adenoma of colon 12/2009   Type 2 diabetes mellitus (HCC)    Wears glasses    Family History  Problem Relation Age of Onset   Hypertension Mother    Diabetes Father    Cancer Sister 4       ovarian ca   Cancer - Other Brother        cancer all over   Coronary artery disease Other        1st degree male relative   Hypertension Other    Colon cancer Neg Hx    Stomach cancer Neg Hx    Esophageal cancer Neg Hx    Pancreatic cancer Neg Hx    Liver disease Neg Hx    Colon polyps Neg Hx    Rectal cancer Neg Hx    Past Surgical History:  Procedure Laterality Date   ABDOMINAL AORTOGRAM W/LOWER EXTREMITY N/A  10/09/2023   Procedure: ABDOMINAL AORTOGRAM W/LOWER EXTREMITY;  Surgeon: Pearline Norman RAMAN, MD;  Location: [facility];  Service: Cardiovascular;  Laterality: N/A;   CARDIAC CATHETERIZATION  03-03-2000  dr danelle taylor   normal lvsf/  two-vessel cad significant complex stenosis at distal left main   CARDIOVASCULAR STRESS TEST  08-09-2010  DR DANELLE TAYLOR   normal lexiscan  nuclear study/  no ischemia/  normal lvf/  ef 54%   CATARACT EXTRACTION W/ INTRAOCULAR LENS IMPLANT Right 03/07/2009   COLONOSCOPY  10/09/2020   12/30/2009   CORONARY ARTERY BYPASS GRAFT  03-06-2000   DR [id number]   third vessel   LOWER EXTREMITY ANGIOGRAPHY N/A 10/09/2023   Procedure: Lower Extremity Angiography;  Surgeon: Pearline Norman RAMAN, MD;  Location: [facility];  Service: Cardiovascular;  Laterality: N/A;   PENILE PROSTHESIS IMPLANT  02/08/2010   COLOPLAST 3-PIECE INFLATABLE   PENILE PROSTHESIS IMPLANT N/A 05/27/2013   Procedure: CYSTO REMOVAL OF PENILE PROSTHESIS;  Surgeon: Mark C Ottelin, MD;  Location: [facility];  Service: Urology;  Laterality: N/A;   POLYPECTOMY  12/30/2009   +  TA   TOTAL HIP ARTHROPLASTY  01/24/2011   Procedure: TOTAL HIP ARTHROPLASTY;  Surgeon: Dempsey GAILS [name];  Location: [facility];  Service: Orthopedics;  Laterality: Left;   TOTAL HIP ARTHROPLASTY Right 01/30/2013   Procedure: RIGHT TOTAL HIP ARTHROPLASTY;  Surgeon: Dempsey GAILS Moan, MD;  Location: [facility];  Service: Orthopedics;  Laterality: Right;    Short Social History:  Social History   Tobacco Use   Smoking status: Former    Current packs/day: 0.00    Average packs/day: 1 pack/day for 22.2 years (22.2 ttl pk-yrs)    Types: Cigarettes    Start date: 03/07/1948    Quit date: 05/22/1970    Years since quitting: 53.6    Passive exposure: Past   Smokeless tobacco: Never  Substance Use Topics   Alcohol  use: No    Comment: seldom    Allergies  Allergen Reactions   Enalapril  Shortness Of Breath    Hydrocodone-Acetaminophen  Nausea Only    Can take codein    Current Outpatient Medications  Medication Sig Dispense Refill   Accu-Chek Softclix Lancets lancets Use as instructed to check blood sugars 1 times a day, fasting 100 each 12   albuterol  (VENTOLIN  HFA) 108 (90 Base) MCG/ACT inhaler INHALE 2 PUFFS INTO THE LUNGS EVERY 4 HOURS AS NEEDED FOR WHEEZING OR SHORTNESS OF BREATH 18 g 5   Alcohol  Swabs (DROPSAFE ALCOHOL  PREP) 70 % PADS USE AS DIRECTED 100 each 3   allopurinol  (ZYLOPRIM ) 100 MG tablet TAKE 1 TABLET EVERY DAY 90 tablet 3   aspirin  81 MG EC tablet Take 2 tablets (162 mg total) by mouth daily. Swallow whole. 100 tablet 3   Blood Glucose Monitoring Suppl (ACCU-CHEK GUIDE ME) w/Device KIT Use as instructed to check blood sugars 1 times a day, fasting 1 kit 0   Blood Glucose Monitoring Suppl (ACCU-CHEK GUIDE ME) w/Device KIT As directed 1 kit 1   Blood Glucose Monitoring Suppl DEVI 1 each by Does not apply route in the morning, at noon, and at bedtime. May substitute to any manufacturer covered by patient's insurance. 1 each 0   cholecalciferol (VITAMIN D) 1000 UNITS tablet Take 1,000 Units by mouth every morning.     cilostazol  (PLETAL ) 50 MG tablet Take 1 tablet (50 mg total) by mouth 2 (two) times daily. 180 tablet 2   colchicine  0.6 MG tablet Take 1 tablet (0.6 mg total) by mouth daily. 90 tablet 3   empagliflozin  (JARDIANCE ) 25 MG TABS tablet Take 1 tablet (25 mg total) by mouth daily before breakfast. 90 tablet 3   finasteride  (PROSCAR ) 5 MG tablet TAKE 1 TABLET EVERY MORNING 90 tablet 3   fluticasone  furoate-vilanterol (BREO ELLIPTA ) 100-25 MCG/INH AEPB Inhale 1 puff into the lungs daily. (Patient taking differently: Inhale 1 puff into the lungs daily as needed (cough).) 1 each 5   glipiZIDE  (GLUCOTROL ) 10 MG tablet Take 2 tablets (20 mg total) by mouth daily before breakfast AND 1 tablet (10 mg total) daily before supper. 270 tablet 3   glucose blood (ACCU-CHEK GUIDE TEST) test  strip 1 each by Other route daily in the afternoon. Use as instructed 100 each 12   irbesartan -hydrochlorothiazide  (AVALIDE) 300-12.5 MG tablet TAKE 1 TABLET EVERY DAY 90 tablet 3   Lancet Devices (ACCU-CHEK SOFTCLIX) lancets Use twice daily as instructed. Dx: 250.00 1 each 3   Lancets (ONETOUCH DELICA PLUS LANCET33G) MISC USE TWICE DAILY AS DIRECTED 200 each 3   lovastatin  (MEVACOR ) 40 MG tablet TAKE 1 TABLET EVERY  MORNING 90 tablet 3   Multiple Vitamins-Minerals (MULTI-VITAMIN GUMMIES PO) Take 1 tablet by mouth daily.     nitroGLYCERIN  (NITROSTAT ) 0.4 MG SL tablet Place 1 tablet (0.4 mg total) under the tongue every 5 (five) minutes as needed for chest pain. 25 tablet 3   pantoprazole  (PROTONIX ) 40 MG tablet TAKE 1 TABLET EVERY MORNING 90 tablet 3   sildenafil  (VIAGRA ) 100 MG tablet Take 0.5-1 tablets (50-100 mg total) by mouth daily as needed for erectile dysfunction. 12 tablet 11   tamsulosin  (FLOMAX ) 0.4 MG CAPS capsule TAKE 1 CAPSULE EVERY DAY 90 capsule 3   No current facility-administered medications for this visit.    REVIEW OF SYSTEMS  All other systems were reviewed and are negative     Objective:  Objective   There were no vitals filed for this visit. There is no height or weight on file to calculate BMI.  Physical Exam General: no acute distress Cardiac: hemodynamically stable Abdomen: non-tender, no pulsatile mass*** Extremities: no edema, cyanosis or wounds*** Vascular:   Right: ***  Left: ***  Data: ABI ***  Previously: Right 0.66 toe pressure 42, left 0.72 toe pressure 57     Assessment/Plan:   Johnny [name] is a 78 y.o. male with PAD and claudication.  He underwent diagnostic angiogram in August 2025 which demonstrated a flush occlusion of the SFA on the right and a severe stenosis of the SFA on the left with severe tibial disease bilaterally. 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  Given that he does not have any symptoms of CLI we will continue with best medical management and surveillance.  If he does develop rest pain or tissue loss he would require a bypass on the right whereas the left would likely require a multilevel endovascular intervention.  Continue with aspirin  and statin. Encouraged to continue as much walking exercise as possible. Plan for follow-up with a repeat ABI in 6 months, instructed to call earlier should his symptoms worsen or if he develops any pain.  Norman GORMAN Serve MD [facility]

## 2024-01-12 ENCOUNTER — Ambulatory Visit (HOSPITAL_COMMUNITY): Admitting: Vascular Surgery

## 2024-01-12 ENCOUNTER — Ambulatory Visit (HOSPITAL_COMMUNITY): Attending: Internal Medicine

## 2024-01-12 ENCOUNTER — Other Ambulatory Visit: Payer: Self-pay

## 2024-01-12 DIAGNOSIS — I739 Peripheral vascular disease, unspecified: Secondary | ICD-10-CM

## 2024-01-31 ENCOUNTER — Encounter (HOSPITAL_BASED_OUTPATIENT_CLINIC_OR_DEPARTMENT_OTHER): Payer: Self-pay

## 2024-01-31 ENCOUNTER — Telehealth: Payer: Self-pay | Admitting: Internal Medicine

## 2024-01-31 ENCOUNTER — Emergency Department (HOSPITAL_BASED_OUTPATIENT_CLINIC_OR_DEPARTMENT_OTHER)

## 2024-01-31 ENCOUNTER — Inpatient Hospital Stay (HOSPITAL_BASED_OUTPATIENT_CLINIC_OR_DEPARTMENT_OTHER)
Admission: EM | Admit: 2024-01-31 | Discharge: 2024-02-02 | DRG: 065 | Disposition: A | Discharge status: Home / Self Care | Attending: Internal Medicine | Admitting: Internal Medicine

## 2024-01-31 ENCOUNTER — Encounter: Payer: Self-pay | Admitting: Pharmacist

## 2024-01-31 ENCOUNTER — Ambulatory Visit: Payer: Self-pay

## 2024-01-31 ENCOUNTER — Observation Stay (HOSPITAL_COMMUNITY)

## 2024-01-31 ENCOUNTER — Other Ambulatory Visit: Payer: Self-pay

## 2024-01-31 DIAGNOSIS — R29810 Facial weakness: Secondary | ICD-10-CM | POA: Diagnosis present

## 2024-01-31 DIAGNOSIS — E119 Type 2 diabetes mellitus without complications: Secondary | ICD-10-CM | POA: Diagnosis not present

## 2024-01-31 DIAGNOSIS — R29707 NIHSS score 7: Secondary | ICD-10-CM | POA: Diagnosis present

## 2024-01-31 DIAGNOSIS — Z860101 Personal history of adenomatous and serrated colon polyps: Secondary | ICD-10-CM

## 2024-01-31 DIAGNOSIS — I6389 Other cerebral infarction: Secondary | ICD-10-CM | POA: Diagnosis not present

## 2024-01-31 DIAGNOSIS — I639 Cerebral infarction, unspecified: Principal | ICD-10-CM | POA: Diagnosis present

## 2024-01-31 DIAGNOSIS — I6523 Occlusion and stenosis of bilateral carotid arteries: Secondary | ICD-10-CM | POA: Diagnosis present

## 2024-01-31 DIAGNOSIS — I2510 Atherosclerotic heart disease of native coronary artery without angina pectoris: Secondary | ICD-10-CM | POA: Diagnosis present

## 2024-01-31 DIAGNOSIS — Z96643 Presence of artificial hip joint, bilateral: Secondary | ICD-10-CM | POA: Diagnosis present

## 2024-01-31 DIAGNOSIS — Z888 Allergy status to other drugs, medicaments and biological substances status: Secondary | ICD-10-CM

## 2024-01-31 DIAGNOSIS — Z885 Allergy status to narcotic agent status: Secondary | ICD-10-CM

## 2024-01-31 DIAGNOSIS — Z7902 Long term (current) use of antithrombotics/antiplatelets: Secondary | ICD-10-CM

## 2024-01-31 DIAGNOSIS — N400 Enlarged prostate without lower urinary tract symptoms: Secondary | ICD-10-CM | POA: Diagnosis not present

## 2024-01-31 DIAGNOSIS — I5022 Chronic systolic (congestive) heart failure: Secondary | ICD-10-CM | POA: Diagnosis present

## 2024-01-31 DIAGNOSIS — I672 Cerebral atherosclerosis: Secondary | ICD-10-CM | POA: Diagnosis not present

## 2024-01-31 DIAGNOSIS — N189 Chronic kidney disease, unspecified: Secondary | ICD-10-CM

## 2024-01-31 DIAGNOSIS — E785 Hyperlipidemia, unspecified: Secondary | ICD-10-CM | POA: Diagnosis present

## 2024-01-31 DIAGNOSIS — D631 Anemia in chronic kidney disease: Secondary | ICD-10-CM | POA: Diagnosis present

## 2024-01-31 DIAGNOSIS — Z79899 Other long term (current) drug therapy: Secondary | ICD-10-CM

## 2024-01-31 DIAGNOSIS — E042 Nontoxic multinodular goiter: Secondary | ICD-10-CM | POA: Diagnosis not present

## 2024-01-31 DIAGNOSIS — E1151 Type 2 diabetes mellitus with diabetic peripheral angiopathy without gangrene: Secondary | ICD-10-CM | POA: Diagnosis present

## 2024-01-31 DIAGNOSIS — I6381 Other cerebral infarction due to occlusion or stenosis of small artery: Secondary | ICD-10-CM | POA: Diagnosis present

## 2024-01-31 DIAGNOSIS — Z8673 Personal history of transient ischemic attack (TIA), and cerebral infarction without residual deficits: Secondary | ICD-10-CM | POA: Diagnosis not present

## 2024-01-31 DIAGNOSIS — I6782 Cerebral ischemia: Secondary | ICD-10-CM | POA: Diagnosis not present

## 2024-01-31 DIAGNOSIS — Z862 Personal history of diseases of the blood and blood-forming organs and certain disorders involving the immune mechanism: Secondary | ICD-10-CM

## 2024-01-31 DIAGNOSIS — I63511 Cerebral infarction due to unspecified occlusion or stenosis of right middle cerebral artery: Secondary | ICD-10-CM | POA: Diagnosis not present

## 2024-01-31 DIAGNOSIS — Z8679 Personal history of other diseases of the circulatory system: Secondary | ICD-10-CM | POA: Diagnosis not present

## 2024-01-31 DIAGNOSIS — F17210 Nicotine dependence, cigarettes, uncomplicated: Secondary | ICD-10-CM | POA: Diagnosis present

## 2024-01-31 DIAGNOSIS — Z7982 Long term (current) use of aspirin: Secondary | ICD-10-CM

## 2024-01-31 DIAGNOSIS — Z7951 Long term (current) use of inhaled steroids: Secondary | ICD-10-CM

## 2024-01-31 DIAGNOSIS — R4701 Aphasia: Secondary | ICD-10-CM | POA: Diagnosis present

## 2024-01-31 DIAGNOSIS — Z833 Family history of diabetes mellitus: Secondary | ICD-10-CM

## 2024-01-31 DIAGNOSIS — Z951 Presence of aortocoronary bypass graft: Secondary | ICD-10-CM

## 2024-01-31 DIAGNOSIS — K219 Gastro-esophageal reflux disease without esophagitis: Secondary | ICD-10-CM | POA: Diagnosis present

## 2024-01-31 DIAGNOSIS — R29705 NIHSS score 5: Secondary | ICD-10-CM | POA: Diagnosis not present

## 2024-01-31 DIAGNOSIS — N1832 Chronic kidney disease, stage 3b: Secondary | ICD-10-CM | POA: Diagnosis not present

## 2024-01-31 DIAGNOSIS — I130 Hypertensive heart and chronic kidney disease with heart failure and stage 1 through stage 4 chronic kidney disease, or unspecified chronic kidney disease: Secondary | ICD-10-CM | POA: Diagnosis present

## 2024-01-31 DIAGNOSIS — E782 Mixed hyperlipidemia: Secondary | ICD-10-CM

## 2024-01-31 DIAGNOSIS — E1122 Type 2 diabetes mellitus with diabetic chronic kidney disease: Secondary | ICD-10-CM | POA: Diagnosis present

## 2024-01-31 DIAGNOSIS — Z809 Family history of malignant neoplasm, unspecified: Secondary | ICD-10-CM

## 2024-01-31 DIAGNOSIS — R471 Dysarthria and anarthria: Secondary | ICD-10-CM | POA: Diagnosis present

## 2024-01-31 DIAGNOSIS — Z7984 Long term (current) use of oral hypoglycemic drugs: Secondary | ICD-10-CM

## 2024-01-31 DIAGNOSIS — Z8249 Family history of ischemic heart disease and other diseases of the circulatory system: Secondary | ICD-10-CM

## 2024-01-31 LAB — DIFFERENTIAL
Abs Immature Granulocytes: 0.01 K/uL (ref 0.00–0.07)
Basophils Absolute: 0 K/uL (ref 0.0–0.1)
Basophils Relative: 1 %
Eosinophils Absolute: 0.1 K/uL (ref 0.0–0.5)
Eosinophils Relative: 2 %
Immature Granulocytes: 0 %
Lymphocytes Relative: 19 %
Lymphs Abs: 1.3 K/uL (ref 0.7–4.0)
Monocytes Absolute: 0.7 K/uL (ref 0.1–1.0)
Monocytes Relative: 10 %
Neutro Abs: 4.6 K/uL (ref 1.7–7.7)
Neutrophils Relative %: 68 %

## 2024-01-31 LAB — CBC
HCT: 36.6 % — ABNORMAL LOW (ref 39.0–52.0)
Hemoglobin: 12.2 g/dL — ABNORMAL LOW (ref 13.0–17.0)
MCH: 28.4 pg (ref 26.0–34.0)
MCHC: 33.3 g/dL (ref 30.0–36.0)
MCV: 85.3 fL (ref 80.0–100.0)
Platelets: 224 K/uL (ref 150–400)
RBC: 4.29 MIL/uL (ref 4.22–5.81)
RDW: 14.6 % (ref 11.5–15.5)
WBC: 6.7 K/uL (ref 4.0–10.5)
nRBC: 0 % (ref 0.0–0.2)

## 2024-01-31 LAB — COMPREHENSIVE METABOLIC PANEL WITH GFR
ALT: 14 U/L (ref 0–44)
AST: 16 U/L (ref 15–41)
Albumin: 3.9 g/dL (ref 3.5–5.0)
Alkaline Phosphatase: 85 U/L (ref 38–126)
Anion gap: 10 (ref 5–15)
BUN: 36 mg/dL — ABNORMAL HIGH (ref 8–23)
CO2: 24 mmol/L (ref 22–32)
Calcium: 9.5 mg/dL (ref 8.9–10.3)
Chloride: 102 mmol/L (ref 98–111)
Creatinine, Ser: 2.28 mg/dL — ABNORMAL HIGH (ref 0.61–1.24)
GFR, Estimated: 28 mL/min — ABNORMAL LOW (ref >60)
Glucose, Bld: 176 mg/dL — ABNORMAL HIGH (ref 70–99)
Potassium: 4.6 mmol/L (ref 3.5–5.1)
Sodium: 136 mmol/L (ref 135–145)
Total Bilirubin: 0.5 mg/dL (ref 0.0–1.2)
Total Protein: 7 g/dL (ref 6.5–8.1)

## 2024-01-31 LAB — URINALYSIS, ROUTINE W REFLEX MICROSCOPIC
Bacteria, UA: NONE SEEN
Bilirubin Urine: NEGATIVE
Glucose, UA: >1000 mg/dL — AB
Hgb urine dipstick: NEGATIVE
Ketones, ur: NEGATIVE mg/dL
Leukocytes,Ua: NEGATIVE
Nitrite: NEGATIVE
Protein, ur: NEGATIVE mg/dL
Specific Gravity, Urine: 1.022 (ref 1.005–1.030)
pH: 6 (ref 5.0–8.0)

## 2024-01-31 LAB — URINE DRUG SCREEN
Amphetamines: NEGATIVE
Barbiturates: NEGATIVE
Benzodiazepines: NEGATIVE
Cocaine: NEGATIVE
Fentanyl: NEGATIVE
Methadone Scn, Ur: NEGATIVE
Opiates: NEGATIVE
Tetrahydrocannabinol: NEGATIVE

## 2024-01-31 LAB — GLUCOSE, CAPILLARY: Glucose-Capillary: 112 mg/dL — ABNORMAL HIGH (ref 70–99)

## 2024-01-31 LAB — PROTIME-INR
INR: 1.2 (ref 0.8–1.2)
Prothrombin Time: 15.5 s — ABNORMAL HIGH (ref 11.4–15.2)

## 2024-01-31 LAB — APTT: aPTT: 31 s (ref 24–36)

## 2024-01-31 LAB — CBG MONITORING, ED: Glucose-Capillary: 181 mg/dL — ABNORMAL HIGH (ref 70–99)

## 2024-01-31 MED ORDER — SODIUM CHLORIDE 0.9 % IV BOLUS
1000.0000 mL | Freq: Once | INTRAVENOUS | Status: AC
  Administered 2024-01-31: 1000 mL via INTRAVENOUS

## 2024-01-31 MED ORDER — IOHEXOL 350 MG/ML SOLN
100.0000 mL | Freq: Once | INTRAVENOUS | Status: AC | PRN
  Administered 2024-01-31: 40 mL via INTRAVENOUS

## 2024-01-31 MED ORDER — PRAVASTATIN SODIUM 40 MG PO TABS
40.0000 mg | ORAL_TABLET | Freq: Every day | ORAL | Status: DC
  Administered 2024-02-01: 40 mg via ORAL
  Filled 2024-01-31: qty 1

## 2024-01-31 MED ORDER — ONDANSETRON HCL 4 MG/2ML IJ SOLN
4.0000 mg | Freq: Four times a day (QID) | INTRAMUSCULAR | Status: DC | PRN
  Administered 2024-01-31: 4 mg via INTRAVENOUS
  Filled 2024-01-31: qty 2

## 2024-01-31 MED ORDER — ACETAMINOPHEN 325 MG PO TABS: 650.0000 mg | ORAL_TABLET | Freq: Four times a day (QID) | ORAL | Status: DC | PRN

## 2024-01-31 MED ORDER — MELATONIN 3 MG PO TABS: 3.0000 mg | ORAL_TABLET | Freq: Every evening | ORAL | Status: DC | PRN

## 2024-01-31 MED ORDER — IOHEXOL 350 MG/ML SOLN
100.0000 mL | Freq: Once | INTRAVENOUS | Status: AC | PRN
  Administered 2024-01-31: 75 mL via INTRAVENOUS

## 2024-01-31 MED ORDER — PANTOPRAZOLE SODIUM 40 MG PO TBEC
40.0000 mg | DELAYED_RELEASE_TABLET | Freq: Every morning | ORAL | Status: DC
  Administered 2024-02-01 – 2024-02-02 (×2): 40 mg via ORAL
  Filled 2024-01-31 (×2): qty 1

## 2024-01-31 MED ORDER — INSULIN ASPART 100 UNIT/ML IJ SOLN
0.0000 [IU] | Freq: Three times a day (TID) | INTRAMUSCULAR | Status: DC
  Administered 2024-02-01 (×2): 1 [IU] via SUBCUTANEOUS
  Administered 2024-02-01: 3 [IU] via SUBCUTANEOUS
  Administered 2024-02-02: 1 [IU] via SUBCUTANEOUS
  Filled 2024-01-31: qty 3
  Filled 2024-01-31 (×3): qty 1

## 2024-01-31 MED ORDER — CLOPIDOGREL BISULFATE 75 MG PO TABS
75.0000 mg | ORAL_TABLET | Freq: Every day | ORAL | Status: DC
  Administered 2024-02-01 – 2024-02-02 (×2): 75 mg via ORAL
  Filled 2024-01-31 (×2): qty 1

## 2024-01-31 MED ORDER — ACETAMINOPHEN 650 MG RE SUPP: 650.0000 mg | Freq: Four times a day (QID) | RECTAL | Status: DC | PRN

## 2024-01-31 MED ORDER — FINASTERIDE 5 MG PO TABS
5.0000 mg | ORAL_TABLET | Freq: Every morning | ORAL | Status: DC
  Administered 2024-02-01 – 2024-02-02 (×2): 5 mg via ORAL
  Filled 2024-01-31 (×2): qty 1

## 2024-01-31 MED ORDER — ASPIRIN 81 MG PO TBEC
81.0000 mg | DELAYED_RELEASE_TABLET | Freq: Every day | ORAL | Status: DC
  Administered 2024-02-01 – 2024-02-02 (×2): 81 mg via ORAL
  Filled 2024-01-31 (×2): qty 1

## 2024-01-31 MED ORDER — STROKE: EARLY STAGES OF RECOVERY BOOK
Freq: Once | Status: AC
  Filled 2024-01-31: qty 1

## 2024-01-31 MED ORDER — HYDRALAZINE HCL 20 MG/ML IJ SOLN: 10.0000 mg | INTRAMUSCULAR | Status: DC | PRN

## 2024-01-31 MED ORDER — CLOPIDOGREL BISULFATE 300 MG PO TABS
300.0000 mg | ORAL_TABLET | Freq: Once | ORAL | Status: AC
  Administered 2024-01-31: 300 mg via ORAL
  Filled 2024-01-31: qty 1

## 2024-01-31 NOTE — ED Notes (Addendum)
 Upon next NIH, Pt appeared to be improving.. Pt was currently able to have a coherent conversation... Pt new NIH was a 2.. Pt still had left sided facial droop and Slight trouble with words... Provider aware and evaluated.[email or web address]

## 2024-01-31 NOTE — ED Notes (Signed)
 Report given to the Floor RN.

## 2024-01-31 NOTE — Telephone Encounter (Signed)
 These symptoms delayed speech, moving his mouth but nothing coming out, had a confused look on his face could be a sign of a stroke.  He needs to go to ER to get checked.  Thank you

## 2024-01-31 NOTE — ED Triage Notes (Signed)
 Family reports patient having a hard time completing sentences since Sunday night.  Left sided facial droop. Not speaking during triage.

## 2024-01-31 NOTE — ED Notes (Signed)
 Report given to [facility].

## 2024-01-31 NOTE — Plan of Care (Signed)
 Patient seen around 7:30 PM on 3 W. Has been evaluated by Dr. Michaela via telestroke. 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 On examination in person, awake alert oriented to self.  Unable to tell me the current month or his age.  Kept on repeating his date of birth.  Subtle left facial droop, no drift in any of the 4 extremities, no sensory issues, definitely has moderate aphasia without much of dysarthria.  NIH stroke scale remains unchanged at 5. Dr. Michaela has had discussions with neuro IR-he is outside the window for intervention with last known well being on Monday. Stroke team to follow  Johnny Lav, MD Neurology

## 2024-01-31 NOTE — ED Provider Notes (Addendum)
 [place] EMERGENCY DEPARTMENT AT [facility] Provider Note   CSN: [id number] Arrival date & time: 01/31/24  1503     Patient presents with: Aphasia   Johnny [name] is a 78 y.o. male.  With a history of hypertension, type 2 diabetes, CKD, CAD, hyperlipidemia and peripheral artery disease who presents to the ED given concern for stroke.  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  At some time on Monday he began to have difficulty speaking.  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  Last known well time 1000 yesterday.  Sometime yesterday afternoon he began to exhibit recurrent aphasia and left-sided facial droop.  He was not evaluated yesterday.  Family numbers called his doctor's office today and they recommended he come to the ED for further evaluation given concern for stroke.  No fevers chills or infectious symptoms at home.  No prior history of similar speech difficulties.  Additional history limited secondary to clinical condition   HPI     Prior to Admission medications   Medication Sig Start Date End Date Taking? Authorizing Provider  Accu-Chek Softclix Lancets lancets Use as instructed to check blood sugars 1 times a day, fasting 10/28/21   Shamleffer, Donell Cardinal, MD  albuterol  (VENTOLIN  HFA) 108 (90 Base) MCG/ACT inhaler INHALE 2 PUFFS INTO THE LUNGS EVERY 4 HOURS AS NEEDED FOR WHEEZING OR SHORTNESS OF BREATH 11/26/21   Plotnikov, Karlynn GAILS, MD  Alcohol  Swabs (DROPSAFE ALCOHOL  PREP) 70 % PADS USE AS DIRECTED 10/18/23   Shamleffer, Ibtehal Jaralla, MD  allopurinol  (ZYLOPRIM ) 100 MG tablet TAKE 1 TABLET EVERY DAY 01/03/24   Plotnikov, Aleksei V, MD  aspirin  81 MG EC tablet Take 2 tablets (162 mg total) by mouth daily. Swallow whole. 07/27/20   Plotnikov, Aleksei V, MD  Blood Glucose Monitoring Suppl (ACCU-CHEK GUIDE ME) w/Device KIT Use as instructed to  check blood sugars 1 times a day, fasting 10/28/21   Shamleffer, Ibtehal Jaralla, MD  Blood Glucose Monitoring Suppl (ACCU-CHEK GUIDE ME) w/Device KIT As directed 11/23/23   Plotnikov, Aleksei V, MD  Blood Glucose Monitoring Suppl DEVI 1 each by Does not apply route in the morning, at noon, and at bedtime. May substitute to any manufacturer covered by patient's insurance. 11/23/23   Plotnikov, Aleksei V, MD  cholecalciferol (VITAMIN D) 1000 UNITS tablet Take 1,000 Units by mouth every morning.    [provider]  cilostazol  (PLETAL ) 50 MG tablet Take 1 tablet (50 mg total) by mouth 2 (two) times daily. 01/11/23   Court Dorn PARAS, MD  colchicine  0.6 MG tablet Take 1 tablet (0.6 mg total) by mouth daily. 05/04/20   Plotnikov, Aleksei V, MD  empagliflozin  (JARDIANCE ) 25 MG TABS tablet Take 1 tablet (25 mg total) by mouth daily before breakfast. 07/28/23   Shamleffer, Donell Cardinal, MD  finasteride  (PROSCAR ) 5 MG tablet TAKE 1 TABLET EVERY MORNING 01/03/24   Plotnikov, Aleksei V, MD  fluticasone  furoate-vilanterol (BREO ELLIPTA ) 100-25 MCG/INH AEPB Inhale 1 puff into the lungs daily. Patient taking differently: Inhale 1 puff into the lungs daily as needed (cough). 07/13/15   Plotnikov, Aleksei V, MD  glipiZIDE  (GLUCOTROL ) 10 MG tablet Take 2 tablets (20 mg total) by mouth daily before breakfast AND 1 tablet (10 mg total) daily before supper. 11/01/23   Shamleffer, Ibtehal Jaralla, MD  glucose blood (ACCU-CHEK GUIDE TEST) test strip 1 each by Other  route daily in the afternoon. Use as instructed 07/28/23   Shamleffer, Ibtehal Jaralla, MD  irbesartan -hydrochlorothiazide  (AVALIDE) 300-12.5 MG tablet TAKE 1 TABLET EVERY DAY 01/04/22   Plotnikov, Aleksei V, MD  Lancet Devices [facility]) lancets Use twice daily as instructed. Dx: 250.00 10/24/14   Plotnikov, Aleksei V, MD  Lancets (ONETOUCH DELICA PLUS LANCET33G) MISC USE TWICE DAILY AS DIRECTED 01/11/20   Plotnikov, Aleksei V, MD  lovastatin   (MEVACOR ) 40 MG tablet TAKE 1 TABLET EVERY MORNING 10/17/23   Plotnikov, Aleksei V, MD  Multiple Vitamins-Minerals (MULTI-VITAMIN GUMMIES PO) Take 1 tablet by mouth daily. 04/15/16   [provider]  nitroGLYCERIN  (NITROSTAT ) 0.4 MG SL tablet Place 1 tablet (0.4 mg total) under the tongue every 5 (five) minutes as needed for chest pain. 10/27/23   Plotnikov, Aleksei V, MD  pantoprazole  (PROTONIX ) 40 MG tablet TAKE 1 TABLET EVERY MORNING 10/17/23   Plotnikov, Aleksei V, MD  sildenafil  (VIAGRA ) 100 MG tablet Take 0.5-1 tablets (50-100 mg total) by mouth daily as needed for erectile dysfunction. 11/13/21   Plotnikov, Karlynn GAILS, MD  tamsulosin  (FLOMAX ) 0.4 MG CAPS capsule TAKE 1 CAPSULE EVERY DAY 01/03/24   Plotnikov, Aleksei V, MD    Allergies: Enalapril  and Hydrocodone-acetaminophen     Review of Systems  Updated Vital Signs BP (!) 164/78   Pulse (!) 54   Temp 97.8 F (36.6 C) (Oral)   Resp 17   SpO2 100%   Physical Exam Vitals and nursing note reviewed.  HENT:     Head: Normocephalic and atraumatic.  Eyes:     Pupils: Pupils are equal, round, and reactive to light.  Cardiovascular:     Rate and Rhythm: Normal rate and regular rhythm.  Pulmonary:     Effort: Pulmonary effort is normal.     Breath sounds: Normal breath sounds.  Abdominal:     Palpations: Abdomen is soft.     Tenderness: There is no abdominal tenderness.  Skin:    General: Skin is warm and dry.  Neurological:     Mental Status: He is alert.     Comments: Left facial droop Expressive aphasia Awake alert following commands No pronator drift Symmetric motor strength bilateral upper and lower extremities  Psychiatric:        Mood and Affect: Mood normal.     (all labs ordered are listed, but only abnormal results are displayed) Labs Reviewed  PROTIME-INR - Abnormal; Notable for the following components:      Result Value   Prothrombin Time 15.5 (*)    All other components within normal limits  CBC -  Abnormal; Notable for the following components:   Hemoglobin 12.2 (*)    HCT 36.6 (*)    All other components within normal limits  COMPREHENSIVE METABOLIC PANEL WITH GFR - Abnormal; Notable for the following components:   Glucose, Bld 176 (*)    BUN 36 (*)    Creatinine, Ser 2.28 (*)    GFR, Estimated 28 (*)    All other components within normal limits  URINALYSIS, ROUTINE W REFLEX MICROSCOPIC - Abnormal; Notable for the following components:   Glucose, UA >1,000 (*)    All other components within normal limits  CBG MONITORING, ED - Abnormal; Notable for the following components:   Glucose-Capillary 181 (*)    All other components within normal limits  APTT  DIFFERENTIAL  URINE DRUG SCREEN  CBG MONITORING, ED    EKG: None  Radiology: CT CEREBRAL PERFUSION W CONTRAST Result Date:  01/31/2024 EXAM: CT BRAIN PERFUSION 01/31/2024 05:54:52 PM TECHNIQUE: Cerebral perfusion analysis using computed tomography with contrast administration, including post-processing of parametric maps with determination of cerebral blood flow, cerebral blood volume, mean transit time and time-to-maximum. Automated exposure control, iterative reconstruction, and/or weight based adjustment of the mA/kV was utilized to reduce the radiation dose to as low as reasonably achievable COMPARISON: CTA head and neck 01/31/2024. CLINICAL HISTORY: Ischemic stroke FINDINGS: CT PERFUSION: EXAM QUALITY: The examination is adequate with diagnostic perfusion maps. No significant motion artifact. Appropriate arterial inflow and venous outflow curves. CORE INFARCT (CBF<30% volume): 0 mL TOTAL HYPOPERFUSION (Tmax>6s volume): 36 mL PENUMBRA: Mismatch volume: 36 mL Mismatch ratio: Not applicable Location: Right MCA territory. IMPRESSION: 1. Right MCA ischemia. The reported mismatch/penumbra volume of 36 mL is a mild overestimation as the known right basal ganglia core infarct was not detected. Electronically signed by: Dasie Hamburg MD  01/31/2024 06:06 PM EST RP Workstation: [id number]   CT ANGIO HEAD NECK W WO CM Result Date: 01/31/2024 EXAM: CT HEAD WITHOUT CTA HEAD AND NECK WITH AND WITHOUT 01/31/2024 04:53:52 PM TECHNIQUE: CTA of the head and neck was performed with and without the administration of 75 mL of intravenous iohexol  (OMNIPAQUE ) 350 MG/ML injection. Noncontrast CT of the head with reconstructed 2-D images are also provided for review. Multiplanar 2D and/or 3D reformatted images are provided for review. Automated exposure control, iterative reconstruction, and/or weight based adjustment of the mA/kV was utilized to reduce the radiation dose to as low as reasonably achievable. COMPARISON: None available CLINICAL HISTORY: Stroke, follow up; aphasia, L facial droop x3days. FINDINGS: CT HEAD: BRAIN AND VENTRICLES: Hypodensity in the right basal ganglia spanning the caudate and lenticula and intervening white matter is suggestive of an acute infarct. No acute cortically based infarct, intracranial hemorrhage, mass, midline shift, hydrocephalus, or extra axial fluid collection is identified. A chronic lacunar infarct is noted in the left caudate head. Cerebral volume is within normal limits for age. Mild bilateral cerebral white matter hypodensities are nonspecific but compatible with chronic small vessel ischemic disease. ORBITS: Bilateral cataract extraction. SINUSES AND MASTOIDS: No acute abnormality. CTA NECK: AORTIC ARCH AND ARCH VESSELS: Normal variant aortic arch branching pattern with common origin of the brachiocephalic and left common carotid arteries. No dissection or arterial injury. No significant stenosis of the brachiocephalic or subclavian arteries. CERVICAL CAROTID ARTERIES: Mixed calcified and soft plaque in the carotid bulbs without a significant stenosis on the left. There is mild narrowing of the proximal right ICA, however the more distal ICA is small in caliber diffusely and there is no significant stenosis by  NASCET criteria. CERVICAL VERTEBRAL ARTERIES: The vertebral arteries are patent with the right being mildly dominant. Streak artifact limits assessment of the right vertebral artery origin, and an underlying significant stenosis is not excluded. There is no evidence of a significant stenosis or dissection elsewhere in the neck. LUNGS AND MEDIASTINUM: Unremarkable. SOFT TISSUES: Small thyroid  nodules measuring up to 9 mm for which no follow up imaging is required. BONES: Advanced cervical spondylosis. Interbody and posterior element ankylosis from C2-C4 and C5-C7. Severe left facet arthrosis at C4-C5 with resultant severe left neural foraminal stenosis. Mild spinal stenosis at C4-C5. CTA HEAD: ANTERIOR CIRCULATION: The intracranial internal carotid arteries are patent with the right carotid artery being diffusely smaller than the left, likely developmental given hypoplasia of the right A1 segment. Siphon atherosclerosis results in up to moderate stenosis on the right and mild stenosis on the left. There is occlusion  of the right M1 segment with moderate reconstitution of the M2 inferior division. The left MCA and both ACAs are patent with mild atherosclerotic irregularity but no evidence of a proximal branch occlusion or significant proximal stenosis. No aneurysm. POSTERIOR CIRCULATION: The intracranial vertebral arteries are widely patent to the basilar. Patent left PICA, right AICA, and bilateral SCA origins are visualized. The basilar artery is widely patent. Posterior communicating arteries are diminutive or absent. Both PCAs are patent without evidence of a significant proximal stenosis. No aneurysm. OTHER: No dural venous sinus thrombosis on this non-dedicated study. These results were communicated by telephone to Dr. Pamella on 01/31/2024 at 5:09 PM. IMPRESSION: 1. 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 2. 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 3. Moderate  right and mild left intracranial ICA stenoses. 4. Mild cervical carotid atherosclerosis without a significant stenosis. 5. Patent vertebral arteries with limited assessment of the right vertebral origin. Electronically signed by: Dasie Hamburg MD 01/31/2024 05:20 PM EST RP Workstation: [id number]     .Critical Care  Performed by: Pamella Ozell LABOR, DO Authorized by: Pamella Ozell LABOR, DO   Critical care provider statement:    Critical care time (minutes):  50   Critical care was necessary to treat or prevent imminent or life-threatening deterioration of the following conditions:  CNS failure or compromise   Critical care was time spent personally by me on the following activities:  Development of treatment plan with patient or surrogate, discussions with consultants, evaluation of patient's response to treatment, examination of patient, ordering and review of laboratory studies, ordering and review of radiographic studies, ordering and performing treatments and interventions, pulse oximetry, re-evaluation of patient's condition, review of old charts and obtaining history from patient or surrogate   I assumed direction of critical care for this patient from another provider in my specialty: no     Care discussed with: admitting provider   Comments:     Discussed with neurology team Dr. Michaela and hospitalist Dr. Claudene    Medications Ordered in the ED  clopidogrel  (PLAVIX ) tablet 300 mg (300 mg Oral Given 01/31/24 1554)  iohexol  (OMNIPAQUE ) 350 MG/ML injection 100 mL (75 mLs Intravenous Contrast Given 01/31/24 1645)  sodium chloride  0.9 % bolus 1,000 mL (1,000 mLs Intravenous New Bag/Given 01/31/24 1721)  iohexol  (OMNIPAQUE ) 350 MG/ML injection 100 mL (40 mLs Intravenous Contrast Given 01/31/24 1747)    Clinical Course as of 01/31/24 1828  Wed Jan 31, 2024  1709 Call taken from radiology. R basal ganglia infarct R M1 occlusion.  I have made neurology team aware.  Dr. Michaela recommends  CT perfusion study while still here at [facility].  Will get this done here and will plan for admission and MRI [MP]  1733 Discussed with admitting hospitalist Dr. Claudene who accepts patient for admission to [facility]  Reevaluated patient.  Speech appears somewhat improved now able to name objects although still slowed production.  Most recent NIH stroke scale score 3 [MP]  1826 CT perfusion completed.  Dr. Michaela is evaluated patient over teleneuro cart at this time.  Transport team is here [MP]    Clinical Course User Index [MP] Pamella Ozell LABOR, DO                                 Medical Decision Making 78 year old male with history as above presenting for strokelike symptoms.  Expressive aphasia and left-sided facial droop are new but have been present over the last 3 days.  Concerning for subacute stroke.  NIH stroke scale score of 7 as detailed below.  While outside of window for thrombolytics or IR thrombectomy.  Will plan to obtain stroke workup here.  He is already on 81 daily aspirin .  Will give Plavix  here.  Discussed with neurology (Dr. Michaela) who agrees with plan for admission to medicine with plan for MRI, further evaluation and monitoring.   NIH Stroke Scale/Score (NIHSS) from [email or web address] on 01/31/2024 * All calculations should be rechecked by clinician prior to use *  RESULT SUMMARY: 7 points NIH Stroke Scale   INPUTS: 1A: Level of consciousness -> 0 = Alert; keenly responsive 1B: Ask month and age -> 2 = Aphasic 1C: 'Blink eyes' & 'squeeze hands' -> 0 = Performs both tasks 2: Horizontal extraocular movements -> 0 = Normal 3: Visual fields -> 0 = No visual loss 4: Facial palsy -> 2 = Partial paralysis (lower face) 5A: Left arm motor drift -> 0 = No drift for 10 seconds 5B: Right arm motor drift -> 0 = No drift for 10 seconds 6A: Left leg motor drift -> 0 = No drift for 5 seconds 6B: Right leg motor drift -> 0 = No drift for 5 seconds 7: Limb Ataxia -> 1 =  Ataxia in 1 Limb 8: Sensation -> 0 = Normal; no sensory loss 9: Language/aphasia -> 2 = Severe aphasia: fragmentary expression, inference needed, cannot identify materials 10: Dysarthria -> 0 = Intubated/unable to test 11: Extinction/inattention -> 0 = No abnormality   Amount and/or Complexity of Data Reviewed Labs: ordered. Radiology: ordered.  Risk Prescription drug management. Decision regarding hospitalization.        Final diagnoses:  Ischemic stroke [facility])    ED Discharge Orders     None          Pamella Ozell LABOR, DO 01/31/24 1735    Pamella Ozell LABOR, DO 01/31/24 1828

## 2024-01-31 NOTE — Progress Notes (Signed)
 Pharmacy Quality Measure Review  This patient is appearing on a report for being at risk of failing the adherence measure for cholesterol (statin) medications this calendar year.   Medication: Lovastatin  Last fill date: 01/02/24 for 90 day supply  Insurance report was not up to date. No action needed at this time.   Darrelyn Drum, PharmD, BCPS, CPP Clinical Pharmacist Practitioner [facility] [phone]

## 2024-01-31 NOTE — ED Notes (Signed)
 Pt initial NIH was a 7... Pt was having trouble getting his words out other than yea... Pt was able follow most commands.. Pt had left sided facial droop, unable to squeeze hands, follow finger to nose, count any fingers (couldn't say anything but yea).... Provider aware.SABRASABRA

## 2024-01-31 NOTE — ED Notes (Signed)
 Pt cannot provide urine sample at this time, family aware we need sample

## 2024-01-31 NOTE — Plan of Care (Signed)
 Transfer from [facility] Johnny [name] is a 78 year old male with pmh HTN, HLD, CAD, CHF, PVD, DM, and BPH had transient dysarthria Sunday night.  Last known normal was around 10 AM yesterday morning.  Thereafter developed left facial droop and dysarthria.  CT shows a right basal lobe ganglia infarct and a right M1 occlusion.  He was out of the window for any kind of intervention.  Neuro was notified about the patient, but we will need to message them upon patient's arrival.  He was given aspirin  and Plavix .  Speech is improving.  Accepted to a telemetry bed.

## 2024-01-31 NOTE — Telephone Encounter (Signed)
 Copied from CRM [id number]. Topic: Clinical - Refused Triage >> Jan 31, 2024  9:13 AM Johnny [name] wrote: Patient/caller voiced complaints of delayed speech, face is not disfigured, face no twitching, no pain,  thinking, but words does not move his mouth until about 50 seconds,, would i NT this . Declined transfer to triage.

## 2024-01-31 NOTE — Consult Note (Signed)
 [facility] Telemedicine Consult   Requesting Provider: Pamella HERO Consult Participants: Patient, nursing Location of the provider: [place], [place] Location of the patient: [facility]  This consult was provided via telemedicine with 2-way video and audio communication. The patient/family was informed that care would be provided in this way and agreed to receive care in this manner.    Chief Complaint: Speech changes  HPI: 78 yo LEFT handed male who presents with speech difficulty. On Monday evening he began to appear confused and he had speech changes. He was trying to speak but nothing was coming out. On Tuesday morning, he seemed relatively normal but he had difficulty once again speaking. Tuesday evening he was talking to his daughter and was having a lot of difficulty speaking. He kept getting frustrated and had trouble speaking, so they decided to call in the morning and take him to his PCP. His PCP told him to go to the ED and so they have [facility].     LKW: Monday tnk given?: No, out of window IR Thrombectomy? No, out of window Modified Rankin Scale: 0-Completely asymptomatic and back to baseline post- stroke Time of teleneurologist evaluation: 18:09  Exam: Vitals:   01/31/24 1700 01/31/24 1800  BP: (!) 149/83 (!) 164/78  Pulse: (!) 48 (!) 54  Resp: 15 17  Temp:    SpO2: 100% 100%    General: in bed  1A: Level of Consciousness - 0 1B: Ask Month and Age - 2 1C: 'Blink Eyes' & 'Squeeze Hands' - 0 2: Test Horizontal Extraocular Movements - 0 3: Test Visual Fields - 0 4: Test Facial Palsy - 1 5A: Test Left Arm Motor Drift - 0 5B: Test Right Arm Motor Drift - 0 6A: Test Left Leg Motor Drift - 0 6B: Test Right Leg Motor Drift - 0 7: Test Limb Ataxia - 0 8: Test Sensation - 0 9: Test Language/Aphasia- 2 10: Test Dysarthria - 0 11: Test Extinction/Inattention - 0 NIHSS score: 5   Imaging Reviewed: CT- small amount of  infarct near head of caudate on the right.  CTA/CTP- likely chronic M1 severe stenosis vs occlusion  Labs reviewed in epic and pertinent values follow: Cr 2.28   Assessment: 78 yo M with right M1 occlusion.  The fact that his symptoms are so mild coupled with the amount of collateralization would argue strongly that this is a severe stenosis +/- having finally occluded.  In any case, given the duration that his symptoms have been present, coupled with the mildness of his symptoms, I think would argue against any type of thrombectomy as this could result in significant compromise of his collateralization and worsening.  He will need to be closely monitored, if he were to worsen and develop severe weakness, then I think there could be consideration of thrombectomy, but given that he has been symptomatic for 2 days I think this is unlikely at this point.   Recommendations:  - permissive htn to 220/120 - HgbA1c, fasting lipid panel - MRI of the brain without contrast - Frequent neuro checks - Echocardiogram - Prophylactic therapy-Antiplatelet med: Aspirin  - dose 81mg  and plavix  75mg  daily after 300mg  load  - Risk factor modification - Telemetry monitoring - PT consult, OT consult, Speech consult - Stroke team to follow  Aisha Seals, MD [facility] [phone]  If 7pm- 7am, please page neurology on call as listed in AMION.

## 2024-01-31 NOTE — Telephone Encounter (Signed)
 Agree  Thank you

## 2024-01-31 NOTE — Telephone Encounter (Signed)
 Spoke with the pt and was able to inform him about the EMERGENT importance of going to the ED to have imaging done on his brain along with other work ups to rule out possible stroke or brain bleed.  **Pt stated understanding and states he is heading to the ED now.

## 2024-01-31 NOTE — ED Notes (Incomplete)
 Pt was unable to squeeze either hand but able to grab both hands, Only able to respond with yea.. Pt was able to look around the room, when his name was called he looked and followed

## 2024-01-31 NOTE — H&P (Signed)
 History and Physical      Johnny [name] FMW:[id number] DOB: 27-Oct-1945 DOA: 01/31/2024; DOS: 01/31/2024  PCP: Garald Karlynn GAILS, MD  Patient coming from: home   I have personally briefly reviewed patient's old medical records in [facility]  Chief Complaint: Expressive aphasia  HPI: Johnny [name] is a 78 y.o. male with medical history significant for DM2, essential hypertension, hyperlipidemia, chronic systolic heart failure, CKD 3B with baseline creatinine 2-2.3, anemia of chronic kidney disease with baseline hemoglobin 12-13.5, who is admitted to [facility] on 01/31/2024 by way of transfer from [facility] with acute ischemic stroke after presenting from home to the latter facility complaining of expressive aphasia.   On the evening of Monday, 01/29/2024, the patient experienced new onset expressive aphasia associate with word finding difficulties.  This was transient, self-limited, and spontaneously resolved shortly after onset, without any residual symptoms noted at that point.  The patient subsequently remained asymptomatic until around 10 AM on Tuesday, 01/30/2024, at which time he experienced recurrence of expressive aphasia associated with word finding difficulties, with family also noting at that point new onset left-sided facial droop.  This was not associate with any acute focal weakness nor any acute focal numbness, paresthesias, vertigo, headache, acute change in vision, dysphagia.  The patient noted persistence in his expressive aphasia as well as left-sided facial droop throughout the remainder of Tuesday, 01/30/2024, and into the morning of 01/31/2024, at which time patient's family contacted his PCP with the above information, at which time PCP recommended to the patient they present to the emergency department for further evaluation management of the above.  Patient notes that his expressive aphasia has been improving, although not yet back to baseline at this  time.  Denies any recent chest pain, shortness of breath, palpitations, diaphoresis, nor any recent subjective fever, chills, rigors, or generalized myalgias.  Denies any prior history of expressive aphasia, or any known history of previous stroke.  His medical history is notable for type 2 diabetes mellitus, essential hypertension, hyperlipidemia denies any known history of paroxysmal atrial fibrillation.  He is a former smoker, having completely quit smoking in the early 1970s after smoking approximately 1 pack/day for slightly more than 20 years leading up to that point.  He is on a daily baby aspirin  at home as well as daily lovastatin .     [facility] ED Course:  Vital signs in the ED were notable for the following: Afebrile; rates in the 50s to 70s; systolic blood pressures in the 1 teens to 160s; respiratory rate 16-25, oxygen saturation 99 to 100% on room air.  Labs were notable for the following: CMP was notable for the following: Sodium 136, bicarbonate 24, creatinine 2.28 compared to most recent prior creatinine data point of 2.20 on 10/09/2023, glucose 176, liver enzymes within normal limits.  CBC notable for white blood cell count 6700, hemoglobin 12.2 compared to 12.6 on 10/09/2023, platelet count 224.  INR 1.2, PTT 31.  Urinalysis showed no white blood cells, and was negative for leukocyte esterase/nitrate, and demonstrate no evidence of bacteria.  Urinary drug screen was pan negative.  Per my interpretation, EKG in ED demonstrated the following: Sinus rhythm with heart rate 61, normal intervals, no evidence of T wave or ST changes, including no evidence of ST elevation.  Imaging in the ED, per corresponding formal radiology read, was notable for the following: CT head showed evidence of acute infarct in the right basal ganglia, will demonstrate no evidence of acute  intracranial hemorrhage.  CTA head and neck showed occlusion of the right M1 segment with moderate reconstitution of the M2  inferior division, also showing moderate right and mild left intracranial ICA stenosis, mild cervical carotid atherosclerosis without significant stenosis, and demonstrated patent vertebral arteries.  CT cerebellar perfusion with contrast, showed right MCA ischemia.  MRI brain has been ordered, with result currently pending.  EDP discussed patient's case/imaging with on-call neurology, Dr. Michaela, who has formally consulted, and conveyed that the patient is outside of the window for tPA and is also not a candidate for thrombectomy at this time, recommending hospitalist admission for further evaluation/management of acute ischemic stroke, including MRI brain, echocardiogram, dual antiplatelet therapy, permissive hypertension, and further assessment of potential modifiable ischemic CVA risk factors, conveying neurology/stroke team will continue to follow.  While in the ED, the following were administered: Plavix  300 mg p.o. x 1 dose, normal saline x 1 L bolus.  Subsequently, the patient was admitted to [facility] for further evaluation and management of presenting acute ischemic right basal ganglia stroke.     Review of Systems: As per HPI otherwise 10 point review of systems negative.   Past Medical History:  Diagnosis Date   Arthritis    At risk for sleep apnea    STOP-BANG=  6   SENT TO PCP 05-21-2013   Benign positional vertigo    Benign prostatic hypertrophy    Blood transfusion without reported diagnosis 2000   CAD (coronary artery disease)    CARDIOLOGIST--  DR DANELLE [place]   CHF (congestive heart failure) (HCC)    Chronic ischemic heart disease, unspecified    ED (erectile dysfunction) of organic origin    GERD (gastroesophageal reflux disease)    History of CHF (congestive heart failure)    systolic   HTN (hypertension)    Hyperlipidemia    Peripheral vascular disease    Tubular adenoma of colon 12/2009   Type 2 diabetes mellitus (HCC)    Wears glasses     Past  Surgical History:  Procedure Laterality Date   ABDOMINAL AORTOGRAM W/LOWER EXTREMITY N/A 10/09/2023   Procedure: ABDOMINAL AORTOGRAM W/LOWER EXTREMITY;  Surgeon: Pearline Norman RAMAN, MD;  Location: [facility] INVASIVE CV LAB;  Service: Cardiovascular;  Laterality: N/A;   CARDIAC CATHETERIZATION  03-03-2000  dr danelle taylor   normal lvsf/  two-vessel cad significant complex stenosis at distal left main   CARDIOVASCULAR STRESS TEST  08-09-2010  DR DANELLE TAYLOR   normal lexiscan  nuclear study/  no ischemia/  normal lvf/  ef 54%   CATARACT EXTRACTION W/ INTRAOCULAR LENS IMPLANT Right 03/07/2009   COLONOSCOPY  10/09/2020   12/30/2009   CORONARY ARTERY BYPASS GRAFT  03-06-2000   DR HZMYJMIU   third vessel   LOWER EXTREMITY ANGIOGRAPHY N/A 10/09/2023   Procedure: Lower Extremity Angiography;  Surgeon: Pearline Norman RAMAN, MD;  Location: [facility] INVASIVE CV LAB;  Service: Cardiovascular;  Laterality: N/A;   PENILE PROSTHESIS IMPLANT  02/08/2010   COLOPLAST 3-PIECE INFLATABLE   PENILE PROSTHESIS IMPLANT N/A 05/27/2013   Procedure: CYSTO REMOVAL OF PENILE PROSTHESIS;  Surgeon: Mark C Ottelin, MD;  Location: [facility];  Service: Urology;  Laterality: N/A;   POLYPECTOMY  12/30/2009   +TA   TOTAL HIP ARTHROPLASTY  01/24/2011   Procedure: TOTAL HIP ARTHROPLASTY;  Surgeon: Dempsey GAILS [name];  Location: [facility] ORS;  Service: Orthopedics;  Laterality: Left;   TOTAL HIP ARTHROPLASTY Right 01/30/2013   Procedure: RIGHT TOTAL HIP ARTHROPLASTY;  Surgeon: Dempsey  LULLA Moan, MD;  Location: [facility] ORS;  Service: Orthopedics;  Laterality: Right;    Social History:  reports that he quit smoking about 53 years ago. His smoking use included cigarettes. He started smoking about 75 years ago. He has a 22.2 pack-year smoking history. He has been exposed to tobacco smoke. He has never used smokeless tobacco. He reports that he does not drink alcohol  and does not use drugs.   Allergies  Allergen Reactions   Enalapril  Shortness Of  Breath   Hydrocodone-Acetaminophen  Nausea Only    Can take codein    Family History  Problem Relation Age of Onset   Hypertension Mother    Diabetes Father    Cancer Sister 77       ovarian ca   Cancer - Other Brother        cancer all over   Coronary artery disease Other        1st degree male relative   Hypertension Other    Colon cancer Neg Hx    Stomach cancer Neg Hx    Esophageal cancer Neg Hx    Pancreatic cancer Neg Hx    Liver disease Neg Hx    Colon polyps Neg Hx    Rectal cancer Neg Hx     Family history reviewed and not pertinent    Prior to Admission medications   Medication Sig Start Date End Date Taking? Authorizing Provider  Accu-Chek Softclix Lancets lancets Use as instructed to check blood sugars 1 times a day, fasting 10/28/21   Shamleffer, Donell Cardinal, MD  albuterol  (VENTOLIN  HFA) 108 (90 Base) MCG/ACT inhaler INHALE 2 PUFFS INTO THE LUNGS EVERY 4 HOURS AS NEEDED FOR WHEEZING OR SHORTNESS OF BREATH 11/26/21   Plotnikov, Karlynn LULLA, MD  Alcohol  Swabs (DROPSAFE ALCOHOL  PREP) 70 % PADS USE AS DIRECTED 10/18/23   Shamleffer, Ibtehal Jaralla, MD  allopurinol  (ZYLOPRIM ) 100 MG tablet TAKE 1 TABLET EVERY DAY 01/03/24   Plotnikov, Aleksei V, MD  aspirin  81 MG EC tablet Take 2 tablets (162 mg total) by mouth daily. Swallow whole. 07/27/20   Plotnikov, Aleksei V, MD  Blood Glucose Monitoring Suppl (ACCU-CHEK GUIDE ME) w/Device KIT Use as instructed to check blood sugars 1 times a day, fasting 10/28/21   Shamleffer, Ibtehal Jaralla, MD  Blood Glucose Monitoring Suppl (ACCU-CHEK GUIDE ME) w/Device KIT As directed 11/23/23   Plotnikov, Aleksei V, MD  Blood Glucose Monitoring Suppl DEVI 1 each by Does not apply route in the morning, at noon, and at bedtime. May substitute to any manufacturer covered by patient's insurance. 11/23/23   Plotnikov, Aleksei V, MD  cholecalciferol (VITAMIN D) 1000 UNITS tablet Take 1,000 Units by mouth every morning.    [provider]   cilostazol  (PLETAL ) 50 MG tablet Take 1 tablet (50 mg total) by mouth 2 (two) times daily. 01/11/23   Court Dorn PARAS, MD  colchicine  0.6 MG tablet Take 1 tablet (0.6 mg total) by mouth daily. 05/04/20   Plotnikov, Aleksei V, MD  empagliflozin  (JARDIANCE ) 25 MG TABS tablet Take 1 tablet (25 mg total) by mouth daily before breakfast. 07/28/23   Shamleffer, Donell Cardinal, MD  finasteride  (PROSCAR ) 5 MG tablet TAKE 1 TABLET EVERY MORNING 01/03/24   Plotnikov, Aleksei V, MD  fluticasone  furoate-vilanterol (BREO ELLIPTA ) 100-25 MCG/INH AEPB Inhale 1 puff into the lungs daily. Patient taking differently: Inhale 1 puff into the lungs daily as needed (cough). 07/13/15   Plotnikov, Karlynn LULLA, MD  glipiZIDE  (GLUCOTROL ) 10 MG tablet Take  2 tablets (20 mg total) by mouth daily before breakfast AND 1 tablet (10 mg total) daily before supper. 11/01/23   Shamleffer, Ibtehal Jaralla, MD  glucose blood (ACCU-CHEK GUIDE TEST) test strip 1 each by Other route daily in the afternoon. Use as instructed 07/28/23   Shamleffer, Ibtehal Jaralla, MD  irbesartan -hydrochlorothiazide  (AVALIDE) 300-12.5 MG tablet TAKE 1 TABLET EVERY DAY 01/04/22   Plotnikov, Aleksei V, MD  Lancet Devices [facility]) lancets Use twice daily as instructed. Dx: 250.00 10/24/14   Plotnikov, Aleksei V, MD  Lancets (ONETOUCH DELICA PLUS LANCET33G) MISC USE TWICE DAILY AS DIRECTED 01/11/20   Plotnikov, Aleksei V, MD  lovastatin  (MEVACOR ) 40 MG tablet TAKE 1 TABLET EVERY MORNING 10/17/23   Plotnikov, Aleksei V, MD  Multiple Vitamins-Minerals (MULTI-VITAMIN GUMMIES PO) Take 1 tablet by mouth daily. 04/15/16   [provider]  nitroGLYCERIN  (NITROSTAT ) 0.4 MG SL tablet Place 1 tablet (0.4 mg total) under the tongue every 5 (five) minutes as needed for chest pain. 10/27/23   Plotnikov, Aleksei V, MD  pantoprazole  (PROTONIX ) 40 MG tablet TAKE 1 TABLET EVERY MORNING 10/17/23   Plotnikov, Aleksei V, MD  sildenafil  (VIAGRA ) 100 MG tablet Take 0.5-1  tablets (50-100 mg total) by mouth daily as needed for erectile dysfunction. 11/13/21   Plotnikov, Karlynn GAILS, MD  tamsulosin  (FLOMAX ) 0.4 MG CAPS capsule TAKE 1 CAPSULE EVERY DAY 01/03/24   Plotnikov, Karlynn GAILS, MD     Objective    Physical Exam: Vitals:   01/31/24 1700 01/31/24 1800 01/31/24 1924 01/31/24 1947  BP: (!) 149/83 (!) 164/78 (!) 169/71   Pulse: (!) 48 (!) 54 63   Resp: 15 17 17    Temp:   98.1 F (36.7 C)   TempSrc:   Oral   SpO2: 100% 100% 100%   Weight:    98.4 kg    General: appears to be stated age; alert, oriented Skin: warm, dry, no rash Head:  AT/[place] Mouth:  Oral mucosa membranes appear moist, normal dentition Neck: supple; trachea midline Heart:  RRR; did not appreciate any M/R/G Lungs: CTAB, did not appreciate any wheezes, rales, or rhonchi Abdomen: + BS; soft, ND, NT Vascular: 2+ pedal pulses b/l; 2+ radial pulses b/l Extremities: no peripheral edema, no muscle wasting    Neuro: expressive aphasia, left facial droop; Normal muscle tone. No tremors.     Labs on Admission: I have personally reviewed following labs and imaging studies  CBC: Recent Labs  Lab 01/31/24 1513  WBC 6.7  NEUTROABS 4.6  HGB 12.2*  HCT 36.6*  MCV 85.3  PLT 224   Basic Metabolic Panel: Recent Labs  Lab 01/31/24 1513  NA 136  K 4.6  CL 102  CO2 24  GLUCOSE 176*  BUN 36*  CREATININE 2.28*  CALCIUM  9.5   GFR: Estimated Creatinine Clearance: 30.4 mL/min (A) (by C-G formula based on SCr of 2.28 mg/dL (H)). Liver Function Tests: Recent Labs  Lab 01/31/24 1513  AST 16  ALT 14  ALKPHOS 85  BILITOT 0.5  PROT 7.0  ALBUMIN 3.9   No results for input(s): LIPASE, AMYLASE in the last 168 hours. No results for input(s): AMMONIA in the last 168 hours. Coagulation Profile: Recent Labs  Lab 01/31/24 1513  INR 1.2   Cardiac Enzymes: No results for input(s): CKTOTAL, CKMB, CKMBINDEX, TROPONINI in the last 168 hours. BNP (last 3 results) No results  for input(s): PROBNP in the last 8760 hours. HbA1C: No results for input(s): HGBA1C in the last 72  hours. CBG: Recent Labs  Lab 01/31/24 1511  GLUCAP 181*   Lipid Profile: No results for input(s): CHOL, HDL, LDLCALC, TRIG, CHOLHDL, LDLDIRECT in the last 72 hours. Thyroid  Function Tests: No results for input(s): TSH, T4TOTAL, FREET4, T3FREE, THYROIDAB in the last 72 hours. Anemia Panel: No results for input(s): VITAMINB12, FOLATE, FERRITIN, TIBC, IRON, RETICCTPCT in the last 72 hours. Urine analysis:    Component Value Date/Time   COLORURINE YELLOW 01/31/2024 1513   APPEARANCEUR CLEAR 01/31/2024 1513   LABSPEC 1.022 01/31/2024 1513   PHURINE 6.0 01/31/2024 1513   GLUCOSEU >1,000 (A) 01/31/2024 1513   GLUCOSEU >=1000 (A) 07/26/2023 1605   HGBUR NEGATIVE 01/31/2024 1513   BILIRUBINUR NEGATIVE 01/31/2024 1513   KETONESUR NEGATIVE 01/31/2024 1513   PROTEINUR NEGATIVE 01/31/2024 1513   UROBILINOGEN 0.2 07/26/2023 1605   NITRITE NEGATIVE 01/31/2024 1513   LEUKOCYTESUR NEGATIVE 01/31/2024 1513    Radiological Exams on Admission: CT CEREBRAL PERFUSION W CONTRAST Result Date: 01/31/2024 EXAM: CT BRAIN PERFUSION 01/31/2024 05:54:52 PM TECHNIQUE: Cerebral perfusion analysis using computed tomography with contrast administration, including post-processing of parametric maps with determination of cerebral blood flow, cerebral blood volume, mean transit time and time-to-maximum. Automated exposure control, iterative reconstruction, and/or weight based adjustment of the mA/kV was utilized to reduce the radiation dose to as low as reasonably achievable COMPARISON: CTA head and neck 01/31/2024. CLINICAL HISTORY: Ischemic stroke FINDINGS: CT PERFUSION: EXAM QUALITY: The examination is adequate with diagnostic perfusion maps. No significant motion artifact. Appropriate arterial inflow and venous outflow curves. CORE INFARCT (CBF<30% volume): 0 mL TOTAL  HYPOPERFUSION (Tmax>6s volume): 36 mL PENUMBRA: Mismatch volume: 36 mL Mismatch ratio: Not applicable Location: Right MCA territory. IMPRESSION: 1. Right MCA ischemia. The reported mismatch/penumbra volume of 36 mL is a mild overestimation as the known right basal ganglia core infarct was not detected. Electronically signed by: Dasie Hamburg MD 01/31/2024 06:06 PM EST RP Workstation: [id number]   CT ANGIO HEAD NECK W WO CM Result Date: 01/31/2024 EXAM: CT HEAD WITHOUT CTA HEAD AND NECK WITH AND WITHOUT 01/31/2024 04:53:52 PM TECHNIQUE: CTA of the head and neck was performed with and without the administration of 75 mL of intravenous iohexol  (OMNIPAQUE ) 350 MG/ML injection. Noncontrast CT of the head with reconstructed 2-D images are also provided for review. Multiplanar 2D and/or 3D reformatted images are provided for review. Automated exposure control, iterative reconstruction, and/or weight based adjustment of the mA/kV was utilized to reduce the radiation dose to as low as reasonably achievable. COMPARISON: None available CLINICAL HISTORY: Stroke, follow up; aphasia, L facial droop x3days. FINDINGS: CT HEAD: BRAIN AND VENTRICLES: Hypodensity in the right basal ganglia spanning the caudate and lenticula and intervening white matter is suggestive of an acute infarct. No acute cortically based infarct, intracranial hemorrhage, mass, midline shift, hydrocephalus, or extra axial fluid collection is identified. A chronic lacunar infarct is noted in the left caudate head. Cerebral volume is within normal limits for age. Mild bilateral cerebral white matter hypodensities are nonspecific but compatible with chronic small vessel ischemic disease. ORBITS: Bilateral cataract extraction. SINUSES AND MASTOIDS: No acute abnormality. CTA NECK: AORTIC ARCH AND ARCH VESSELS: Normal variant aortic arch branching pattern with common origin of the brachiocephalic and left common carotid arteries. No dissection or arterial injury.  No significant stenosis of the brachiocephalic or subclavian arteries. CERVICAL CAROTID ARTERIES: Mixed calcified and soft plaque in the carotid bulbs without a significant stenosis on the left. There is mild narrowing of the proximal right ICA, however the more  distal ICA is small in caliber diffusely and there is no significant stenosis by NASCET criteria. CERVICAL VERTEBRAL ARTERIES: The vertebral arteries are patent with the right being mildly dominant. Streak artifact limits assessment of the right vertebral artery origin, and an underlying significant stenosis is not excluded. There is no evidence of a significant stenosis or dissection elsewhere in the neck. LUNGS AND MEDIASTINUM: Unremarkable. SOFT TISSUES: Small thyroid  nodules measuring up to 9 mm for which no follow up imaging is required. BONES: Advanced cervical spondylosis. Interbody and posterior element ankylosis from C2-C4 and C5-C7. Severe left facet arthrosis at C4-C5 with resultant severe left neural foraminal stenosis. Mild spinal stenosis at C4-C5. CTA HEAD: ANTERIOR CIRCULATION: The intracranial internal carotid arteries are patent with the right carotid artery being diffusely smaller than the left, likely developmental given hypoplasia of the right A1 segment. Siphon atherosclerosis results in up to moderate stenosis on the right and mild stenosis on the left. There is occlusion of the right M1 segment with moderate reconstitution of the M2 inferior division. The left MCA and both ACAs are patent with mild atherosclerotic irregularity but no evidence of a proximal branch occlusion or significant proximal stenosis. No aneurysm. POSTERIOR CIRCULATION: The intracranial vertebral arteries are widely patent to the basilar. Patent left PICA, right AICA, and bilateral SCA origins are visualized. The basilar artery is widely patent. Posterior communicating arteries are diminutive or absent. Both PCAs are patent without evidence of a significant  proximal stenosis. No aneurysm. OTHER: No dural venous sinus thrombosis on this non-dedicated study. These results were communicated by telephone to Dr. Pamella on 01/31/2024 at 5:09 PM. IMPRESSION: 1. 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 2. 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 3. Moderate right and mild left intracranial ICA stenoses. 4. Mild cervical carotid atherosclerosis without a significant stenosis. 5. Patent vertebral arteries with limited assessment of the right vertebral origin. Electronically signed by: Dasie Hamburg MD 01/31/2024 05:20 PM EST RP Workstation: [id number]      Assessment/Plan   Principal Problem:   Acute ischemic cerebrovascular accident (CVA) involving right middle cerebral artery territory [facility]) Active Problems:   HLD (hyperlipidemia)   DM2 (diabetes mellitus, type 2) (HCC)   BPH (benign prostatic hyperplasia)   Expressive aphasia   History of essential hypertension   Chronic systolic CHF (congestive heart failure) (HCC)   CKD stage 3b, GFR 30-44 ml/min (HCC)   History of anemia due to chronic kidney disease      #) Acute ischemic right basal ganglia stroke: The setting of presenting new onset expressive aphasia as well as left-sided facial droop, with last known well at 10 AM on 01/30/2024, with presenting CT head showing acute infarct in the right basal ganglia, without any evidence of acute intracranial hemorrhage.  CTA head and neck shows occlusion of the right M1 segment, will to be chronic in nature, with evidence of moderate reconstitution of the M1 inferior division.  Otherwise, CTA head and neck showed no evidence of large vessel occlusion, or any evidence of dissection, aneurysm, or thrombus.  EDP discussed patient's case/imaging with on-call neurology, Dr. Michaela, who has formally consulted, and conveyed that the patient is outside of the window for tPA and is also not a candidate for  thrombectomy at this time, recommending hospitalist admission for further evaluation/management of acute ischemic stroke, including MRI brain, echocardiogram, dual antiplatelet therapy, permissive hypertension, and further assessment of potential modifiable ischemic CVA risk factors, conveying neurology/stroke  team will continue to follow.   The patient possesses multiple modifiable CVA risk factors including a history of  DM2, HTN, HLD.  He is also noted to be a former smoker, as above.  No known history of paroxysmal atrial fibrillation. EKG in ED today showed sinus rhythm without overt evidence of acute ischemic changes. Will perform further assessment of modifiable ischemic CVA risk factors, detailed below.   On baby aspirin  qday at home.    Plan: Nursing bedside swallow evaluation x 1 now, and will not initiate oral medications or diet until the patient has passed this. Head of the bed at 30 degrees. Neuro checks per protocol. VS per protocol. Will allow for permissive hypertension for 48 hours during which will hold home antihypertensive medications, with prn IV hydralazine  ordered for SBP greater than 220 mmHg or DBP greater than 120 mmHg until  10 AM on 11/27. Monitor on telemetry, including monitoring for atrial fibrillation as modifiable risk factor for acute ischemic CVA.   MRI brain.   TTE without  bubble study has been ordered for the morning. Additionally, as component of evaluation of potential modifiable ischemic CVA risk factors, will also check lipid panel and A1c. PT/OT consults have been ordered for the morning. Additionally, as a standard component of work-up for acute ischemic cva, I've also ordered a speech therapy consult for the morning.  Per neurology consult, will initiate course of dual antiplatelet therapy with daily baby aspirin  as well as Plavix  75 mg p.o. daily.  For now, continue home lovastatin .  Neurology formally consulted, with neurology/stroke team to continue to follow,  as above.                         #) Type 2 Diabetes Mellitus: documented history of such. Home insulin  regimen: None. Home oral hypoglycemic agents: Glipizide , empagliflozin . presenting blood sugar: 176.  Per initial chart review, no prior hemoglobin A1c level on file.  Will check updated hemoglobin A1c level as component of evaluation of modifiable ischemic CVA risk factors in the setting of presenting acute ischemic stroke, as above.  Plan: accuchecks QAC and HS with low dose SSI.  Add on hemoglobin A1c level, as above. hold home oral hypoglycemic agents during this hospitalization.                       #) Essential Hypertension: documented h/o such, with outpatient antihypertensive regimen including irbesartan , HCTZ.  Of note, patient is also on tamsulosin  as a component of management of BPH.  SBP's in the ED today: 1 teens to 160s mmHg. in the setting of presenting acute ischemic CVA, with associated neurology recommendations that include observance of permissive hypertension, will hold home antihypertensive medications for now.   Plan: Close monitoring of subsequent BP via routine VS. observance of permissive hypertension until approximately 10 AM on 02/01/2024.  Hold home irbesartan , HCTZ, as well as tamsulosin  for now.  As needed IV hydralazine  for systolic blood pressure greater than 220 or diastolic blood pressure greater than 120 mmHg. monitor on telemetry.  Monitor strict I's and O's and daily weights.                         #) Hyperlipidemia: documented h/o such. On lovastatin  as outpatient.   Plan: continue home statin for now.  Follow-up results of lipid panel, which has been ordered for tomorrow morning.                        #)  Benign Prostatic Hyperplasia:  documented h/o such; on tamsulosin  as well as finasteride  as outpatient.  In the setting of current observance of permissive hypertension,  will hold next dose of tamsulosin .  Plan: monitor strict I's & O's and daily weights. Repeat CMP in AM.  Continue outpatient finasteride .  Hold home tamsulosin  for now and observance of permissive hypertension, as above.                           #) Chronic systolic heart failure: documented history of such, although no prior echocardiogram results available for my initial chart review.  Will follow for results of echocardiogram as ordered as component of evaluation of presenting acute ischemic stroke, as above.  No clinical evidence at this time to suggest acutely decompensated heart failure.  Him diuretic regimen is reported to include HCTZ.  He is also noted to be on irbesartan  as an outpatient, although will follow-up for final medication reconciliation given the concomitant presence of the patient's CKD.   Plan: monitor strict I's & O's and daily weights. Repeat CMP in AM. Check serum mag level.  And observance of permissive hypertension, will hold home HCTZ, irbesartan  for now.  Follow-up result of echocardiogram, ordered for the morning as component of evaluation of presenting acute ischemic stroke, as above.  Check BNP.                           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    Plan: Monitor strict I's and O's and daily weights.  Attempt to avoid nephrotoxic agents.  CMP/magnesium  level in the AM.                       #) Anemia of chronic kidney disease: Documented history of such, a/w with baseline hgb range 10-13.5, with presenting hgb consistent with this range, in the absence of any overt evidence of active bleed.  Of note, INR, PTT were not elevated, as further quantified above.   Plan: Repeat CBC in the morning.         DVT prophylaxis: SCD's   Code Status: Full code Family Communication: I discussed pt's  case with his wife, who was present at bedside;  Disposition Plan: Per Rounding Team Consults called: EDP discussed patient's case/imaging with on-call neurology, Dr. Michaela, who has formally consulted, and conveyed that the patient is outside of the window for tPA and is also not a candidate for thrombectomy at this time, recommending hospitalist admission for further evaluation/management of acute ischemic stroke, including MRI brain, echocardiogram, dual antiplatelet therapy, permissive hypertension, and further assessment of potential modifiable ischemic CVA risk factors, conveying neurology/stroke team will continue to follow.;  Admission status: observation     I SPENT GREATER THAN 75  MINUTES IN CLINICAL CARE TIME/MEDICAL DECISION-MAKING IN COMPLETING THIS ADMISSION.      Eva NOVAK Kate Sweetman DO [facility]  From 7PM - 7AM   01/31/2024, 8:08 PM

## 2024-01-31 NOTE — ED Provider Notes (Signed)
 [facility] FOR CT   Johnny [name] LABOR, DO 01/31/24 1621

## 2024-01-31 NOTE — Telephone Encounter (Signed)
 FYI Only or Action Required?: FYI only for provider: ED advised.  Patient was last seen in primary care on 11/23/2023 by Plotnikov, Karlynn GAILS, MD.  Called Nurse Triage reporting Aphasia.  Symptoms began several days ago.  Interventions attempted: Nothing.  Symptoms are: unchanged.  Triage Disposition: Go to ED Now (Notify PCP)  Patient/caregiver understands and will follow disposition?: No, refuses disposition Reason for Disposition  [1] Loss of speech or garbled speech AND [2] sudden onset AND [3] brief (now gone)  Answer Assessment - Initial Assessment Questions Patient's wife Jon calling in today. States he has vascular issues and has an appointment next week. Had a procedure back in August for vascular issues in his leg. Denies drooping in the face. States he seemed withdrawn the other day. Wife asked if patient has chest pain, this RN heard patient talking in the background and say no, Patient also denies weakness, numbness or dizziness. Patient admits this happened once before last week, and then wife brought up 2 months and patient stated it happened once 2 months ago. Advised ED, wife mentioned UC and I still advised ED, educated difference between ED and UC. Wife stated she is going to let him go to his exercise class, mentioned the cost of the ED. Wife stated he is not bleeding out, really does not want to go to ED.   1. SYMPTOM: What is the main symptom you are concerned about? (e.g., weakness, numbness)     Delayed speech, moving his mouth but nothing coming out, had a confused look on his face.  2. ONSET: When did this start? (e.g., minutes, hours, days; while sleeping)     Noticed it Monday, but it has happened before but not frequently  3. LAST NORMAL: When was the last time you (the patient) were normal (no symptoms)?     Sunday   4. PATTERN Does this come and go, or has it been constant since it started?  Is it present now?     More frequent since  Monday  5. CARDIAC SYMPTOMS: Have you had any of the following symptoms: chest pain, difficulty breathing, palpitations?     Denies  6. NEUROLOGIC SYMPTOMS: Have you had any of the following symptoms: headache, dizziness, vision loss, double vision, changes in speech, unsteady on your feet?     Changes in speech  7. OTHER SYMPTOMS: Do you have any other symptoms?     Denies  Protocols used: Neurologic [facility]  Copied from CRM #[id number]. Topic: Clinical - Red Word Triage >> Jan 31, 2024  8:06 AM Burnard DEL wrote: Red Word that prompted transfer to Nurse Triage: speech issues,talking but nothing coming out

## 2024-02-01 ENCOUNTER — Observation Stay (HOSPITAL_COMMUNITY)

## 2024-02-01 DIAGNOSIS — E119 Type 2 diabetes mellitus without complications: Secondary | ICD-10-CM | POA: Diagnosis not present

## 2024-02-01 DIAGNOSIS — I10 Essential (primary) hypertension: Secondary | ICD-10-CM | POA: Diagnosis not present

## 2024-02-01 DIAGNOSIS — I6389 Other cerebral infarction: Secondary | ICD-10-CM | POA: Diagnosis not present

## 2024-02-01 DIAGNOSIS — E785 Hyperlipidemia, unspecified: Secondary | ICD-10-CM

## 2024-02-01 DIAGNOSIS — I63511 Cerebral infarction due to unspecified occlusion or stenosis of right middle cerebral artery: Secondary | ICD-10-CM | POA: Diagnosis not present

## 2024-02-01 DIAGNOSIS — I639 Cerebral infarction, unspecified: Secondary | ICD-10-CM | POA: Diagnosis present

## 2024-02-01 LAB — ECHOCARDIOGRAM COMPLETE
Area-P 1/2: 3.17 cm2
S' Lateral: 3.5 cm
Weight: 3470.92 [oz_av]

## 2024-02-01 LAB — LIPID PANEL
Cholesterol: 158 mg/dL (ref 0–200)
HDL: 30 mg/dL — ABNORMAL LOW (ref >40)
LDL Cholesterol: 98 mg/dL (ref 0–99)
Total CHOL/HDL Ratio: 5.3 ratio
Triglycerides: 151 mg/dL — ABNORMAL HIGH (ref <150)
VLDL: 30 mg/dL (ref 0–40)

## 2024-02-01 LAB — COMPREHENSIVE METABOLIC PANEL WITH GFR
ALT: 13 U/L (ref 0–44)
AST: 15 U/L (ref 15–41)
Albumin: 3 g/dL — ABNORMAL LOW (ref 3.5–5.0)
Alkaline Phosphatase: 67 U/L (ref 38–126)
Anion gap: 13 (ref 5–15)
BUN: 34 mg/dL — ABNORMAL HIGH (ref 8–23)
CO2: 20 mmol/L — ABNORMAL LOW (ref 22–32)
Calcium: 8.5 mg/dL — ABNORMAL LOW (ref 8.9–10.3)
Chloride: 104 mmol/L (ref 98–111)
Creatinine, Ser: 2.25 mg/dL — ABNORMAL HIGH (ref 0.61–1.24)
GFR, Estimated: 29 mL/min — ABNORMAL LOW (ref >60)
Glucose, Bld: 194 mg/dL — ABNORMAL HIGH (ref 70–99)
Potassium: 4.3 mmol/L (ref 3.5–5.1)
Sodium: 137 mmol/L (ref 135–145)
Total Bilirubin: 0.8 mg/dL (ref 0.0–1.2)
Total Protein: 6 g/dL — ABNORMAL LOW (ref 6.5–8.1)

## 2024-02-01 LAB — GLUCOSE, CAPILLARY
Glucose-Capillary: 126 mg/dL — ABNORMAL HIGH (ref 70–99)
Glucose-Capillary: 224 mg/dL — ABNORMAL HIGH (ref 70–99)
Glucose-Capillary: 139 mg/dL — ABNORMAL HIGH (ref 70–99)
Glucose-Capillary: 131 mg/dL — ABNORMAL HIGH (ref 70–99)

## 2024-02-01 LAB — CBC WITH DIFFERENTIAL/PLATELET
Abs Immature Granulocytes: 0.02 K/uL (ref 0.00–0.07)
Basophils Absolute: 0 K/uL (ref 0.0–0.1)
Basophils Relative: 1 %
Eosinophils Absolute: 0.1 K/uL (ref 0.0–0.5)
Eosinophils Relative: 2 %
HCT: 35.7 % — ABNORMAL LOW (ref 39.0–52.0)
Hemoglobin: 11.6 g/dL — ABNORMAL LOW (ref 13.0–17.0)
Immature Granulocytes: 0 %
Lymphocytes Relative: 19 %
Lymphs Abs: 1.3 K/uL (ref 0.7–4.0)
MCH: 27.6 pg (ref 26.0–34.0)
MCHC: 32.5 g/dL (ref 30.0–36.0)
MCV: 85 fL (ref 80.0–100.0)
Monocytes Absolute: 0.6 K/uL (ref 0.1–1.0)
Monocytes Relative: 8 %
Neutro Abs: 4.8 K/uL (ref 1.7–7.7)
Neutrophils Relative %: 70 %
Platelets: 214 K/uL (ref 150–400)
RBC: 4.2 MIL/uL — ABNORMAL LOW (ref 4.22–5.81)
RDW: 14.4 % (ref 11.5–15.5)
WBC: 6.9 K/uL (ref 4.0–10.5)
nRBC: 0 % (ref 0.0–0.2)

## 2024-02-01 LAB — MAGNESIUM: Magnesium: 2.3 mg/dL (ref 1.7–2.4)

## 2024-02-01 LAB — BRAIN NATRIURETIC PEPTIDE: B Natriuretic Peptide: 50.6 pg/mL (ref 0.0–100.0)

## 2024-02-01 MED ORDER — LACTATED RINGERS IV SOLN: INTRAVENOUS | Status: DC

## 2024-02-01 MED ORDER — ATORVASTATIN CALCIUM 40 MG PO TABS
40.0000 mg | ORAL_TABLET | Freq: Every day | ORAL | Status: DC
  Administered 2024-02-02: 40 mg via ORAL
  Filled 2024-02-01: qty 1

## 2024-02-01 MED ORDER — ATORVASTATIN CALCIUM 40 MG PO TABS: 40.0000 mg | ORAL_TABLET | Freq: Every day | ORAL | Status: DC

## 2024-02-01 NOTE — Evaluation (Signed)
 Speech Language Pathology Evaluation Patient Details Name: Johnny [name] MRN: [id number] DOB: April 24, 1945 Today's Date: 02/01/2024 Time: 8743-8693 SLP Time Calculation (min) (ACUTE ONLY): 10 min  Problem List:  Patient Active Problem List   Diagnosis Date Noted   CVA (cerebral vascular accident) (HCC) 02/01/2024   Acute ischemic cerebrovascular accident (CVA) involving right middle cerebral artery territory (HCC) 01/31/2024   Expressive aphasia 01/31/2024   History of essential hypertension 01/31/2024   Chronic systolic CHF (congestive heart failure) (HCC) 01/31/2024   CKD stage 3b, GFR 30-44 ml/min (HCC) 01/31/2024   History of anemia due to chronic kidney disease 01/31/2024   BPH (benign prostatic hyperplasia)    PAD (peripheral artery disease) 09/13/2022   Leg pain 09/06/2022   Weakness of both legs 06/07/2022   Bilateral calf pain 04/27/2022   Diabetes mellitus (HCC) 12/13/2021   Type 2 diabetes mellitus with diabetic polyneuropathy, without long-term current use of insulin  (HCC) 08/13/2021   DM2 (diabetes mellitus, type 2) (HCC) 08/13/2021   Tophus of olecranon bursa of both elbows due to gout 07/27/2020   CTS (carpal tunnel syndrome) 07/27/2020   Cerumen impaction 05/04/2020   Left leg DVT (HCC) 01/13/2020   Pure hyperglyceridemia 12/16/2019   HLD (hyperlipidemia) 12/16/2019   Pain and swelling of lower leg, left 12/16/2019   Bilateral hearing loss 07/11/2016   BPPV (benign paroxysmal positional vertigo), unspecified laterality 07/11/2016   Gout attack 06/15/2016   Erectile dysfunction 07/13/2015   Well adult exam 06/12/2015   OA (osteoarthritis) of hip 01/30/2013   Osteoarthritis of right hip 10/19/2012   Primary osteoarthritis of left hip 01/26/2011   COSTOCHONDRITIS 08/10/2009   TOBACCO USE, QUIT 12/23/2008   BLADDER OUTLET OBSTRUCTION 10/24/2008   DEGENERATIVE JOINT DISEASE, LEFT HIP 10/08/2007   ISCHEMIC HEART DISEASE 08/07/2007   Arthropathy 08/07/2007   DM  type 2 causing vascular disease (HCC) 03/02/2007   Dyslipidemia 03/02/2007   Essential hypertension 03/02/2007   CAD (coronary artery disease) 03/02/2007   GERD 03/02/2007   Past Medical History:  Past Medical History:  Diagnosis Date   Arthritis    At risk for sleep apnea    STOP-BANG=  6   SENT TO PCP 05-21-2013   Benign positional vertigo    Benign prostatic hypertrophy    Blood transfusion without reported diagnosis 2000   CAD (coronary artery disease)    CARDIOLOGIST--  DR DANELLE [place]   CHF (congestive heart failure) (HCC)    Chronic ischemic heart disease, unspecified    ED (erectile dysfunction) of organic origin    GERD (gastroesophageal reflux disease)    History of CHF (congestive heart failure)    systolic   HTN (hypertension)    Hyperlipidemia    Peripheral vascular disease    Tubular adenoma of colon 12/2009   Type 2 diabetes mellitus (HCC)    Wears glasses    Past Surgical History:  Past Surgical History:  Procedure Laterality Date   ABDOMINAL AORTOGRAM W/LOWER EXTREMITY N/A 10/09/2023   Procedure: ABDOMINAL AORTOGRAM W/LOWER EXTREMITY;  Surgeon: Pearline Norman RAMAN, MD;  Location: [facility] INVASIVE CV LAB;  Service: Cardiovascular;  Laterality: N/A;   CARDIAC CATHETERIZATION  03-03-2000  dr danelle taylor   normal lvsf/  two-vessel cad significant complex stenosis at distal left main   CARDIOVASCULAR STRESS TEST  08-09-2010  DR DANELLE TAYLOR   normal lexiscan  nuclear study/  no ischemia/  normal lvf/  ef 54%   CATARACT EXTRACTION W/ INTRAOCULAR LENS IMPLANT Right 03/07/2009   COLONOSCOPY  10/09/2020  12/30/2009   CORONARY ARTERY BYPASS GRAFT  03-06-2000   DR [id number]   third vessel   LOWER EXTREMITY ANGIOGRAPHY N/A 10/09/2023   Procedure: Lower Extremity Angiography;  Surgeon: Pearline Norman RAMAN, MD;  Location: [facility] INVASIVE CV LAB;  Service: Cardiovascular;  Laterality: N/A;   PENILE PROSTHESIS IMPLANT  02/08/2010   COLOPLAST 3-PIECE INFLATABLE   PENILE PROSTHESIS  IMPLANT N/A 05/27/2013   Procedure: CYSTO REMOVAL OF PENILE PROSTHESIS;  Surgeon: Mark C Ottelin, MD;  Location: [facility];  Service: Urology;  Laterality: N/A;   POLYPECTOMY  12/30/2009   +TA   TOTAL HIP ARTHROPLASTY  01/24/2011   Procedure: TOTAL HIP ARTHROPLASTY;  Surgeon: Dempsey GAILS [name];  Location: [facility] ORS;  Service: Orthopedics;  Laterality: Left;   TOTAL HIP ARTHROPLASTY Right 01/30/2013   Procedure: RIGHT TOTAL HIP ARTHROPLASTY;  Surgeon: Dempsey GAILS Moan, MD;  Location: [facility];  Service: Orthopedics;  Laterality: Right;   HPI:  78 year old with past medical history significant for diabetes type 2, hypertension, hyperlipidemia, chronic systolic heart failure, CKD 3B baseline creatinine 2--2.3, anemia of chronic kidney disease with baseline hemoglobin 12 who presents complaining of expressive aphasia that  started 01/29/2024.  Symptoms resolved.  Patient subsequently on 01/30/2024 had recurrence of expressive aphasia associated with word finding difficulty, left facial droop.  Symptoms persist into the morning on 11/26 at which patient presented to the ED.   Assessment / Plan / Recommendation Clinical Impression   Pt presented with elements of the [place]  Aphasia Screening Test, where he had no signs of receptive or expressive impairment. However, given the reports of the transient nature of his language issues, it is recommended that an SLP f/u in a few days to see if any aphasia is still present. Additionally, his voice presented as dysphonic, this is not an acute issue but he would benefit from outpatient speech therapy services to address dysphonia.  Pt given aphasia assessment, he scored with 100% accuracy for naming, automatic speech, repetition, yes/no responses, and with 90% accuracy with two-step directions. His daughter reports that his aphasiac speech was present as recent as last night but there has been no incidence since this morning. Given the transient nature  of his aphasia presenting and then resolving several times over the course of this week, SLP to f/u within a few days to see if the issue has truly resolved. Additionally, his voice was very hoarse which he endorses is baseline. 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 Both patient and his daughter Johnny [name] were agreeable to this treatment plan and recommendations.     SLP Assessment  SLP Recommendation/Assessment: Patient needs continued Speech Language Pathology Services SLP Visit Diagnosis: Aphasia (R47.01);Other (comment) (dysphonia)     Assistance Recommended at Discharge  None  Functional Status Assessment Patient has had a recent decline in their functional status and demonstrates the ability to make significant improvements in function in a reasonable and predictable amount of time.  Frequency and Duration min 1 x/week  1 week      SLP Evaluation Cognition  Overall Cognitive Status: Within Functional Limits for tasks assessed Orientation Level: Oriented X4       Comprehension  Auditory Comprehension Overall Auditory Comprehension: Appears within functional limits for tasks assessed Yes/No Questions: Within Functional Limits Commands: Within Functional Limits Conversation: Complex    Expression Verbal Expression Overall Verbal Expression: Appears within functional limits for tasks assessed Initiation: No impairment Automatic  Speech: Name;Social Response;Counting;Day of week Level of Generative/Spontaneous Verbalization: Conversation Repetition: No impairment Naming: No impairment Pragmatics: No impairment   Oral / Motor  Oral Motor/Sensory Function Overall Oral Motor/Sensory Function: Mild impairment Facial Symmetry: Abnormal symmetry left (slight left sided droop) Motor Speech Overall Motor Speech: Appears within functional limits for tasks assessed Phonation: Hoarse            Manuelita Blew M.S. CCC-SLP

## 2024-02-01 NOTE — Progress Notes (Signed)
 PROGRESS NOTE    Johnny [name]  FMW:[id number] DOB: Nov 30, 1945 DOA: 01/31/2024 PCP: Garald Karlynn GAILS, MD   Brief Narrative: 78 year old with past medical history significant for diabetes type 2, hypertension, hyperlipidemia, chronic systolic heart failure, CKD 3B baseline creatinine 2--2.3, anemia of chronic kidney disease with baseline hemoglobin 12 who presents complaining of expressive aphasia that  started 01/29/2024.  Symptoms resolved.  Patient subsequently on 01/30/2024 had recurrence of expressive aphasia associated with word finding difficulty, left facial droop.  Symptoms persist into the morning on 11/26 at which patient presented to the ED.  Evaluation in the ED CT head show acute infarct in the right basal ganglia.  CTA head and neck show occlusion of the right M1 segment with moderate reconstitution of M2 inferior division, also showing moderate right mild left intracranial ICA stenosis, mild cervical carotid atherosclerosis without significant stenosis.  Patient evaluated by neurology, patient is out of window.  For tPA and not a candidate for thrombectomy.  Admitted for further evaluation of his stroke.   Assessment & Plan:   Principal Problem:   Acute ischemic cerebrovascular accident (CVA) involving right middle cerebral artery territory [facility]) Active Problems:   HLD (hyperlipidemia)   DM2 (diabetes mellitus, type 2) (HCC)   BPH (benign prostatic hyperplasia)   Expressive aphasia   History of essential hypertension   Chronic systolic CHF (congestive heart failure) (HCC)   CKD stage 3b, GFR 30-44 ml/min (HCC)   History of anemia due to chronic kidney disease   1-Acute ischemic right basal ganglia stroke: - CT angio head and neck: Acute infarct in the right basal ganglia. 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  Moderate right and mild left intracranial ICA stenoses. Mild cervical carotid atherosclerosis without a  significant stenosis. Patent vertebral arteries with limited assessment of the right vertebral origin. -MRI Brain: 2 cm acute ischemic right MCA territory infarct involving the right basal ganglia.  No associated hemorrhage of mass effect.  Few additional punctate foci of acute ischemia to adjacent of the right frontal temporal region.  Few small remote lacunar infarct involving the left Without no close and left thalamus.  -Now on aspirin  and plavix  Plan for ECHO Follow up further neurology recommendations.  LD 98, A1c:  2-Diabetes type 2: - Continue sliding scale insulin  - Holding Jardiance  for now  3-Essential hypertension: - Permissive hypertension - Hold Irbesartan   and hydrochlorothiazide   Hyperlipidemia: - Continue Pravachol   BPH: - Continue Proscar   Chronic systolic heart failure -FU echo.  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 -Needs out patient follow up   Estimated body mass index is 32.04 kg/m as calculated from the following:   Height as of 11/23/23: 5' 9 (1.753 m).   Weight as of this encounter: 98.4 kg.   DVT prophylaxis: SCD Code Status: Full code Family Communication: Wife who was at bedside.  Disposition Plan:  Status is: Observation The patient will require care spanning > 2 midnights and should be moved to inpatient because: management of stroke    Consultants:  Neurology   Procedures:  ECHO pending  Antimicrobials:    Subjective: He is alert, able to answer questions, per wife aphasia stable, on and off.   Objective: Vitals:  01/31/24 1947 02/01/24 0000 02/01/24 0400 02/01/24 0404  BP:  (!) 134/54 (!) 140/59   Pulse:  (!) 57 62   Resp:  16 17   Temp:  97.9 F (36.6 C) 97.7 F (36.5 C)    TempSrc:  Oral Oral   SpO2:  98% 97%   Weight: 98.4 kg   98.4 kg    Intake/Output Summary (Last 24 hours) at 02/01/2024 0727 Last data filed at 02/01/2024 0636 Gross per 24 hour  Intake --  Output 550 ml  Net -550 ml   Filed Weights   01/31/24 1947 02/01/24 0404  Weight: 98.4 kg 98.4 kg    Examination:  General exam: Appears calm and comfortable  Respiratory system: Clear to auscultation. Respiratory effort normal. Cardiovascular system: S1 & S2 heard, RRR. No JVD, murmurs, rubs, gallops or clicks. No pedal edema. Gastrointestinal system: Abdomen is nondistended, soft and nontender. No organomegaly or masses felt. Normal bowel sounds heard. Central nervous system: Alert and oriented. Abe to answer few questions, left facial droop Extremities: Symmetric 5 x 5 power.    Data Reviewed: I have personally reviewed following labs and imaging studies  CBC: Recent Labs  Lab 01/31/24 1513 02/01/24 0117  WBC 6.7 6.9  NEUTROABS 4.6 4.8  HGB 12.2* 11.6*  HCT 36.6* 35.7*  MCV 85.3 85.0  PLT 224 214   Basic Metabolic Panel: Recent Labs  Lab 01/31/24 1513 02/01/24 0117  NA 136 137  K 4.6 4.3  CL 102 104  CO2 24 20*  GLUCOSE 176* 194*  BUN 36* 34*  CREATININE 2.28* 2.25*  CALCIUM  9.5 8.5*  MG  --  2.3   GFR: Estimated Creatinine Clearance: 30.8 mL/min (A) (by C-G formula based on SCr of 2.25 mg/dL (H)). Liver Function Tests: Recent Labs  Lab 01/31/24 1513 02/01/24 0117  AST 16 15  ALT 14 13  ALKPHOS 85 67  BILITOT 0.5 0.8  PROT 7.0 6.0*  ALBUMIN 3.9 3.0*   No results for input(s): LIPASE, AMYLASE in the last 168 hours. No results for input(s): AMMONIA in the last 168 hours. Coagulation Profile: Recent Labs  Lab 01/31/24 1513  INR 1.2   Cardiac Enzymes: No results for input(s): CKTOTAL, CKMB, CKMBINDEX, TROPONINI in the last 168 hours. BNP (last 3 results) No results for input(s): PROBNP in the last 8760 hours. HbA1C: No results for  input(s): HGBA1C in the last 72 hours. CBG: Recent Labs  Lab 01/31/24 1511 01/31/24 2242 02/01/24 0620  GLUCAP 181* 112* 126*   Lipid Profile: Recent Labs    02/01/24 0117  CHOL 158  HDL 30*  LDLCALC 98  TRIG 848*  CHOLHDL 5.3   Thyroid  Function Tests: No results for input(s): TSH, T4TOTAL, FREET4, T3FREE, THYROIDAB in the last 72 hours. Anemia Panel: No results for input(s): VITAMINB12, FOLATE, FERRITIN, TIBC, IRON, RETICCTPCT in the last 72 hours. Sepsis Labs: No results for input(s): PROCALCITON, LATICACIDVEN in the last 168 hours.  No results found for this or any previous visit (from the past 240 hours).       Radiology Studies: MR BRAIN WO CONTRAST Result Date: 01/31/2024 CLINICAL DATA:  Initial evaluation for acute neuro deficit, stroke. EXAM: MRI HEAD WITHOUT CONTRAST TECHNIQUE: Multiplanar, multiecho pulse sequences of the brain and surrounding structures were obtained without intravenous contrast. COMPARISON:  CTs from earlier the same day. FINDINGS: Brain: Cerebral volume within normal limits. 2 cm focus of restricted diffusion involving the right basal ganglia, consistent with an acute ischemic right MCA territory  infarct. Few additional punctate foci of acute ischemia noted at the adjacent right frontotemporal region (series 2, image 26). No associated hemorrhage or mass effect. No other evidence for acute or subacute ischemia. Gray-white matter differentiation otherwise maintained. No other areas of chronic cortical infarction. No acute or chronic intracranial blood products. Few small remote lacunar infarcts noted at the left caudate nucleus and left thalamus. No mass lesion, midline shift or mass effect. No hydrocephalus or extra-axial fluid collection. Pituitary gland within normal limits. Vascular: Major intracranial vascular flow voids are maintained. Skull and upper cervical spine: Craniocervical junction within normal limits. Bone  marrow signal intensity normal. No scalp soft tissue abnormality. Sinuses/Orbits: Prior bilateral ocular lens replacement. Paranasal sinuses are largely clear. No significant mastoid effusion. 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 IMPRESSION: 1. 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 No associated hemorrhage or mass effect. 2. Few additional punctate foci of acute ischemia at the adjacent right frontotemporal region. 3. Few small remote lacunar infarcts involving the left caudate nucleus and left thalamus. 4. 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 Electronically Signed   By: Morene Hoard M.D.   On: 01/31/2024 22:25   CT CEREBRAL PERFUSION W CONTRAST Result Date: 01/31/2024 EXAM: CT BRAIN PERFUSION 01/31/2024 05:54:52 PM TECHNIQUE: Cerebral perfusion analysis using computed tomography with contrast administration, including post-processing of parametric maps with determination of cerebral blood flow, cerebral blood volume, mean transit time and time-to-maximum. Automated exposure control, iterative reconstruction, and/or weight based adjustment of the mA/kV was utilized to reduce the radiation dose to as low as reasonably achievable COMPARISON: CTA head and neck 01/31/2024. CLINICAL HISTORY: Ischemic stroke FINDINGS: CT PERFUSION: EXAM QUALITY: The examination is adequate with diagnostic perfusion maps. No significant motion artifact. Appropriate arterial inflow and venous outflow curves. CORE INFARCT (CBF<30% volume): 0 mL TOTAL HYPOPERFUSION (Tmax>6s volume): 36 mL PENUMBRA: Mismatch volume: 36 mL Mismatch ratio: Not applicable Location: Right MCA territory. IMPRESSION: 1. Right MCA ischemia. The reported  mismatch/penumbra volume of 36 mL is a mild overestimation as the known right basal ganglia core infarct was not detected. Electronically signed by: Dasie Hamburg MD 01/31/2024 06:06 PM EST RP Workstation: [id number]   CT ANGIO HEAD NECK W WO CM Result Date: 01/31/2024 EXAM: CT HEAD WITHOUT CTA HEAD AND NECK WITH AND WITHOUT 01/31/2024 04:53:52 PM TECHNIQUE: CTA of the head and neck was performed with and without the administration of 75 mL of intravenous iohexol  (OMNIPAQUE ) 350 MG/ML injection. Noncontrast CT of the head with reconstructed 2-D images are also provided for review. Multiplanar 2D and/or 3D reformatted images are provided for review. Automated exposure control, iterative reconstruction, and/or weight based adjustment of the mA/kV was utilized to reduce the radiation dose to as low as reasonably achievable. COMPARISON: None available CLINICAL HISTORY: Stroke, follow up; aphasia, L facial droop x3days. FINDINGS: CT HEAD: BRAIN AND VENTRICLES: Hypodensity in the right basal ganglia spanning the caudate and lenticula and intervening white matter is suggestive of an acute infarct. No acute cortically based infarct, intracranial hemorrhage, mass, midline shift, hydrocephalus, or extra axial fluid collection is identified. A chronic lacunar infarct is noted in the left caudate head. Cerebral volume is within normal limits for age. Mild bilateral cerebral white matter hypodensities are nonspecific  but compatible with chronic small vessel ischemic disease. ORBITS: Bilateral cataract extraction. SINUSES AND MASTOIDS: No acute abnormality. CTA NECK: AORTIC ARCH AND ARCH VESSELS: Normal variant aortic arch branching pattern with common origin of the brachiocephalic and left common carotid arteries. No dissection or arterial injury. No significant stenosis of the brachiocephalic or subclavian arteries. CERVICAL CAROTID ARTERIES: Mixed calcified and soft plaque in the carotid bulbs without a significant stenosis  on the left. There is mild narrowing of the proximal right ICA, however the more distal ICA is small in caliber diffusely and there is no significant stenosis by NASCET criteria. CERVICAL VERTEBRAL ARTERIES: The vertebral arteries are patent with the right being mildly dominant. Streak artifact limits assessment of the right vertebral artery origin, and an underlying significant stenosis is not excluded. There is no evidence of a significant stenosis or dissection elsewhere in the neck. LUNGS AND MEDIASTINUM: Unremarkable. SOFT TISSUES: Small thyroid  nodules measuring up to 9 mm for which no follow up imaging is required. BONES: Advanced cervical spondylosis. Interbody and posterior element ankylosis from C2-C4 and C5-C7. Severe left facet arthrosis at C4-C5 with resultant severe left neural foraminal stenosis. Mild spinal stenosis at C4-C5. CTA HEAD: ANTERIOR CIRCULATION: The intracranial internal carotid arteries are patent with the right carotid artery being diffusely smaller than the left, likely developmental given hypoplasia of the right A1 segment. Siphon atherosclerosis results in up to moderate stenosis on the right and mild stenosis on the left. There is occlusion of the right M1 segment with moderate reconstitution of the M2 inferior division. The left MCA and both ACAs are patent with mild atherosclerotic irregularity but no evidence of a proximal branch occlusion or significant proximal stenosis. No aneurysm. POSTERIOR CIRCULATION: The intracranial vertebral arteries are widely patent to the basilar. Patent left PICA, right AICA, and bilateral SCA origins are visualized. The basilar artery is widely patent. Posterior communicating arteries are diminutive or absent. Both PCAs are patent without evidence of a significant proximal stenosis. No aneurysm. OTHER: No dural venous sinus thrombosis on this non-dedicated study. These results were communicated by telephone to Dr. Pamella on 01/31/2024 at 5:09 PM.  IMPRESSION: 1. 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 2. 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 3. Moderate right and mild left intracranial ICA stenoses. 4. Mild cervical carotid atherosclerosis without a significant stenosis. 5. Patent vertebral arteries with limited assessment of the right vertebral origin. Electronically signed by: Dasie Hamburg MD 01/31/2024 05:20 PM EST RP Workstation: [id number]        Scheduled Meds:  aspirin  EC  81 mg Oral Daily   clopidogrel   75 mg Oral Daily   finasteride   5 mg Oral q morning   insulin  aspart  0-9 Units Subcutaneous TID WC   pantoprazole   40 mg Oral q morning   pravastatin   40 mg Oral Daily   Continuous Infusions:   LOS: 0 days    Time spent: 35 Minutes    Shantavia Jha A Jahking Lesser, MD [facility]   If 7PM-7AM, please contact night-coverage [email or web address]  02/01/2024, 7:27 AM

## 2024-02-01 NOTE — Progress Notes (Incomplete)
 STROKE TEAM PROGRESS NOTE   SUBJECTIVE (INTERVAL HISTORY) His daughter is at the bedside and wife is on the phone.  Overall his condition is rapidly improving. Pt stated that his speech has back to baseline (although still mild dysarthria) and no more confusion.    OBJECTIVE Temp:  [97.3 F (36.3 C)-98.6 F (37 C)] 97.8 F (36.6 C) (11/27 1938) Pulse Rate:  [52-62] 52 (11/27 1938) Cardiac Rhythm: Sinus bradycardia (11/27 1901) Resp:  [16-18] 17 (11/27 1938) BP: (132-146)/(54-66) 136/58 (11/27 1938) SpO2:  [97 %-99 %] 99 % (11/27 1938) Weight:  [98.4 kg] 98.4 kg (11/27 0404)  Recent Labs  Lab 01/31/24 2242 02/01/24 0620 02/01/24 1127 02/01/24 1528 02/01/24 2242  GLUCAP 112* 126* 224* 139* 131*   Recent Labs  Lab 01/31/24 1513 02/01/24 0117  NA 136 137  K 4.6 4.3  CL 102 104  CO2 24 20*  GLUCOSE 176* 194*  BUN 36* 34*  CREATININE 2.28* 2.25*  CALCIUM  9.5 8.5*  MG  --  2.3   Recent Labs  Lab 01/31/24 1513 02/01/24 0117  AST 16 15  ALT 14 13  ALKPHOS 85 67  BILITOT 0.5 0.8  PROT 7.0 6.0*  ALBUMIN 3.9 3.0*   Recent Labs  Lab 01/31/24 1513 02/01/24 0117  WBC 6.7 6.9  NEUTROABS 4.6 4.8  HGB 12.2* 11.6*  HCT 36.6* 35.7*  MCV 85.3 85.0  PLT 224 214   No results for input(s): CKTOTAL, CKMB, CKMBINDEX, TROPONINI in the last 168 hours. Recent Labs    01/31/24 1513  LABPROT 15.5*  INR 1.2   Recent Labs    01/31/24 1513  COLORURINE YELLOW  LABSPEC 1.022  PHURINE 6.0  GLUCOSEU >1,000*  HGBUR NEGATIVE  BILIRUBINUR NEGATIVE  KETONESUR NEGATIVE  PROTEINUR NEGATIVE  NITRITE NEGATIVE  LEUKOCYTESUR NEGATIVE       Component Value Date/Time   CHOL 158 02/01/2024 0117   TRIG 151 (H) 02/01/2024 0117   HDL 30 (L) 02/01/2024 0117   CHOLHDL 5.3 02/01/2024 0117   VLDL 30 02/01/2024 0117   LDLCALC 98 02/01/2024 0117   Lab Results  Component Value Date   HGBA1C 8.3 (A) 11/01/2023      Component Value Date/Time   LABOPIA NEGATIVE 01/31/2024  1513   COCAINSCRNUR NEGATIVE 01/31/2024 1513   LABBENZ NEGATIVE 01/31/2024 1513   AMPHETMU NEGATIVE 01/31/2024 1513   THCU NEGATIVE 01/31/2024 1513   LABBARB NEGATIVE 01/31/2024 1513    No results for input(s): ETH in the last 168 hours.  I have personally reviewed the radiological images below and agree with the radiology interpretations.  ECHOCARDIOGRAM COMPLETE Result Date: 02/01/2024    ECHOCARDIOGRAM REPORT   Patient Name:   Johnny [name] Date of Exam: 02/01/2024 Medical Rec #:  [id number]       Height:       69.0 in Accession #:    [id number]      Weight:       216.9 lb Date of Birth:  11/09/1945       BSA:          2.139 m Patient Age:    78 years        BP:           146/66 mmHg Patient Gender: M               HR:           56 bpm. Exam Location:  Inpatient Procedure: 2D Echo, Cardiac Doppler and Color Doppler (  Both Spectral and Color            Flow Doppler were utilized during procedure). Indications:    TIA  History:        Patient has no prior history of Echocardiogram examinations.                 CHF, CAD, TIA; Risk Factors:Diabetes, Dyslipidemia,                 Hypertension, Former Smoker and Sleep Apnea.  Sonographer:    Juliene Rucks Referring Phys: [id number] EVA KATHEE PORE  Sonographer Comments: Patient is obese. IMPRESSIONS  1. Left ventricular ejection fraction, by estimation, is 60 to 65%. The left ventricle has normal function. The left ventricle has no regional Delamar motion abnormalities. Left ventricular diastolic parameters are consistent with Grade I diastolic dysfunction (impaired relaxation).  2. Right ventricular systolic function is normal. The right ventricular size is normal.  3. The mitral valve is normal in structure. Trivial mitral valve regurgitation. No evidence of mitral stenosis.  4. The aortic valve is normal in structure. There is moderate calcification of the aortic valve. Aortic valve regurgitation is not visualized. Aortic valve sclerosis/calcification is  present, without any evidence of aortic stenosis.  5. The inferior vena cava is normal in size with greater than 50% respiratory variability, suggesting right atrial pressure of 3 mmHg. FINDINGS  Left Ventricle: Left ventricular ejection fraction, by estimation, is 60 to 65%. The left ventricle has normal function. The left ventricle has no regional Crescenzo motion abnormalities. The left ventricular internal cavity size was normal in size. There is  no left ventricular hypertrophy. Left ventricular diastolic parameters are consistent with Grade I diastolic dysfunction (impaired relaxation). Right Ventricle: The right ventricular size is normal. No increase in right ventricular Guerry thickness. Right ventricular systolic function is normal. Left Atrium: Left atrial size was normal in size. Right Atrium: Right atrial size was normal in size. Pericardium: There is no evidence of pericardial effusion. Mitral Valve: The mitral valve is normal in structure. Trivial mitral valve regurgitation. No evidence of mitral valve stenosis. Tricuspid Valve: The tricuspid valve is normal in structure. Tricuspid valve regurgitation is trivial. No evidence of tricuspid stenosis. Aortic Valve: The aortic valve is normal in structure. There is moderate calcification of the aortic valve. Aortic valve regurgitation is not visualized. Aortic valve sclerosis/calcification is present, without any evidence of aortic stenosis. Pulmonic Valve: The pulmonic valve was grossly normal. Pulmonic valve regurgitation is not visualized. No evidence of pulmonic stenosis. Aorta: The aortic root is normal in size and structure. Venous: The inferior vena cava is normal in size with greater than 50% respiratory variability, suggesting right atrial pressure of 3 mmHg. IAS/Shunts: No atrial level shunt detected by color flow Doppler.  LEFT VENTRICLE PLAX 2D LVIDd:         4.90 cm   Diastology LVIDs:         3.50 cm   LV e' medial:    5.66 cm/s LV PW:         1.10  cm   LV E/e' medial:  16.4 LV IVS:        1.00 cm   LV e' lateral:   8.16 cm/s LVOT diam:     2.00 cm   LV E/e' lateral: 11.4 LV SV:         86 LV SV Index:   40 LVOT Area:     3.14 cm  RIGHT VENTRICLE RV S  prime:     11.70 cm/s TAPSE (M-mode): 2.0 cm LEFT ATRIUM             Index        RIGHT ATRIUM           Index LA diam:        5.10 cm 2.38 cm/m   RA Area:     14.90 cm LA Vol (A2C):   64.4 ml 30.11 ml/m  RA Volume:   40.80 ml  19.08 ml/m LA Vol (A4C):   51.0 ml 23.85 ml/m LA Biplane Vol: 58.5 ml 27.35 ml/m  AORTIC VALVE LVOT Vmax:   108.00 cm/s LVOT Vmean:  79.700 cm/s LVOT VTI:    0.274 m  AORTA Ao Root diam: 3.20 cm MITRAL VALVE MV Area (PHT): 3.17 cm    SHUNTS MV Decel Time: 239 msec    Systemic VTI:  0.27 m MV E velocity: 92.80 cm/s  Systemic Diam: 2.00 cm MV A velocity: 97.50 cm/s MV E/A ratio:  0.95 Toribio Fuel MD Electronically signed by Toribio Fuel MD Signature Date/Time: 02/01/2024/11:39:23 AM    Final    MR BRAIN WO CONTRAST Result Date: 01/31/2024 CLINICAL DATA:  Initial evaluation for acute neuro deficit, stroke. EXAM: MRI HEAD WITHOUT CONTRAST TECHNIQUE: Multiplanar, multiecho pulse sequences of the brain and surrounding structures were obtained without intravenous contrast. COMPARISON:  CTs from earlier the same day. FINDINGS: Brain: Cerebral volume within normal limits. 2 cm focus of restricted diffusion involving the right basal ganglia, consistent with an acute ischemic right MCA territory infarct. Few additional punctate foci of acute ischemia noted at the adjacent right frontotemporal region (series 2, image 26). No associated hemorrhage or mass effect. No other evidence for acute or subacute ischemia. Gray-white matter differentiation otherwise maintained. No other areas of chronic cortical infarction. No acute or chronic intracranial blood products. Few small remote lacunar infarcts noted at the left caudate nucleus and left thalamus. No mass lesion, midline shift or  mass effect. No hydrocephalus or extra-axial fluid collection. Pituitary gland within normal limits. Vascular: Major intracranial vascular flow voids are maintained. Skull and upper cervical spine: Craniocervical junction within normal limits. Bone marrow signal intensity normal. No scalp soft tissue abnormality. Sinuses/Orbits: Prior bilateral ocular lens replacement. Paranasal sinuses are largely clear. No significant mastoid effusion. 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 IMPRESSION: 1. 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 No associated hemorrhage or mass effect. 2. Few additional punctate foci of acute ischemia at the adjacent right frontotemporal region. 3. Few small remote lacunar infarcts involving the left caudate nucleus and left thalamus. 4. 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 Electronically Signed   By: Morene Hoard M.D.   On: 01/31/2024 22:25   CT CEREBRAL PERFUSION W CONTRAST Result Date: 01/31/2024 EXAM: CT BRAIN PERFUSION 01/31/2024 05:54:52 PM TECHNIQUE: Cerebral perfusion analysis using computed tomography with contrast administration, including post-processing of parametric maps with determination of cerebral blood flow, cerebral blood volume, mean transit time and time-to-maximum. Automated exposure control, iterative reconstruction, and/or weight based adjustment of the mA/kV was utilized to reduce the radiation dose to as low as reasonably achievable COMPARISON: CTA head and neck 01/31/2024. CLINICAL HISTORY: Ischemic stroke FINDINGS: CT PERFUSION: EXAM QUALITY: The  examination is adequate with diagnostic perfusion maps. No significant motion artifact. Appropriate arterial inflow and  venous outflow curves. CORE INFARCT (CBF<30% volume): 0 mL TOTAL HYPOPERFUSION (Tmax>6s volume): 36 mL PENUMBRA: Mismatch volume: 36 mL Mismatch ratio: Not applicable Location: Right MCA territory. IMPRESSION: 1. Right MCA ischemia. The reported mismatch/penumbra volume of 36 mL is a mild overestimation as the known right basal ganglia core infarct was not detected. Electronically signed by: Dasie Hamburg MD 01/31/2024 06:06 PM EST RP Workstation: [id number]   CT ANGIO HEAD NECK W WO CM Result Date: 01/31/2024 EXAM: CT HEAD WITHOUT CTA HEAD AND NECK WITH AND WITHOUT 01/31/2024 04:53:52 PM TECHNIQUE: CTA of the head and neck was performed with and without the administration of 75 mL of intravenous iohexol  (OMNIPAQUE ) 350 MG/ML injection. Noncontrast CT of the head with reconstructed 2-D images are also provided for review. Multiplanar 2D and/or 3D reformatted images are provided for review. Automated exposure control, iterative reconstruction, and/or weight based adjustment of the mA/kV was utilized to reduce the radiation dose to as low as reasonably achievable. COMPARISON: None available CLINICAL HISTORY: Stroke, follow up; aphasia, L facial droop x3days. FINDINGS: CT HEAD: BRAIN AND VENTRICLES: Hypodensity in the right basal ganglia spanning the caudate and lenticula and intervening white matter is suggestive of an acute infarct. No acute cortically based infarct, intracranial hemorrhage, mass, midline shift, hydrocephalus, or extra axial fluid collection is identified. A chronic lacunar infarct is noted in the left caudate head. Cerebral volume is within normal limits for age. Mild bilateral cerebral white matter hypodensities are nonspecific but compatible with chronic small vessel ischemic disease. ORBITS: Bilateral cataract extraction. SINUSES AND MASTOIDS: No acute abnormality. CTA NECK: AORTIC ARCH AND ARCH VESSELS: Normal variant aortic arch branching pattern with common origin of the brachiocephalic and  left common carotid arteries. No dissection or arterial injury. No significant stenosis of the brachiocephalic or subclavian arteries. CERVICAL CAROTID ARTERIES: Mixed calcified and soft plaque in the carotid bulbs without a significant stenosis on the left. There is mild narrowing of the proximal right ICA, however the more distal ICA is small in caliber diffusely and there is no significant stenosis by NASCET criteria. CERVICAL VERTEBRAL ARTERIES: The vertebral arteries are patent with the right being mildly dominant. Streak artifact limits assessment of the right vertebral artery origin, and an underlying significant stenosis is not excluded. There is no evidence of a significant stenosis or dissection elsewhere in the neck. LUNGS AND MEDIASTINUM: Unremarkable. SOFT TISSUES: Small thyroid  nodules measuring up to 9 mm for which no follow up imaging is required. BONES: Advanced cervical spondylosis. Interbody and posterior element ankylosis from C2-C4 and C5-C7. Severe left facet arthrosis at C4-C5 with resultant severe left neural foraminal stenosis. Mild spinal stenosis at C4-C5. CTA HEAD: ANTERIOR CIRCULATION: The intracranial internal carotid arteries are patent with the right carotid artery being diffusely smaller than the left, likely developmental given hypoplasia of the right A1 segment. Siphon atherosclerosis results in up to moderate stenosis on the right and mild stenosis on the left. There is occlusion of the right M1 segment with moderate reconstitution of the M2 inferior division. The left MCA and both ACAs are patent with mild atherosclerotic irregularity but no evidence of a proximal branch occlusion or significant proximal stenosis. No aneurysm. POSTERIOR CIRCULATION: The intracranial vertebral arteries are widely patent to the basilar. Patent left PICA, right AICA, and bilateral SCA origins are visualized. The basilar artery is widely patent. Posterior communicating arteries are diminutive or  absent. Both PCAs  are patent without evidence of a significant proximal stenosis. No aneurysm. OTHER: No dural venous sinus thrombosis on this non-dedicated study. These results were communicated by telephone to Dr. Pamella on 01/31/2024 at 5:09 PM. IMPRESSION: 1. 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 2. 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 3. Moderate right and mild left intracranial ICA stenoses. 4. Mild cervical carotid atherosclerosis without a significant stenosis. 5. Patent vertebral arteries with limited assessment of the right vertebral origin. Electronically signed by: Dasie Hamburg MD 01/31/2024 05:20 PM EST RP Workstation: [id number]     PHYSICAL EXAM  Temp:  [97.3 F (36.3 C)-98.6 F (37 C)] 97.8 F (36.6 C) (11/27 1938) Pulse Rate:  [52-62] 52 (11/27 1938) Resp:  [16-18] 17 (11/27 1938) BP: (132-146)/(54-66) 136/58 (11/27 1938) SpO2:  [97 %-99 %] 99 % (11/27 1938) Weight:  [98.4 kg] 98.4 kg (11/27 0404)  General - Well nourished, well developed, in no apparent distress.  Ophthalmologic - fundi not visualized due to noncooperation.  Cardiovascular - Regular rhythm and rate.  Neuro - awake, alert, slight lethargic, eyes open, orientated to age, place, time and people. No aphasia, paucity of speech but answered questions appropriately, following all simple commands. Able to name and repeat with slight psychomotor slowing. No gaze palsy, tracking bilaterally, visual field full. Mild L facial droop. Tongue midline. Bilateral UEs 5/5, no drift. Bilaterally LEs 4/5, no drift. Sensation symmetrical bilaterally, b/l FTN intact but mildly slow, gait not tested.    ASSESSMENT/PLAN Johnny [name] is a 78 y.o. male with history of HTN, DM, HLD, CAD, PAD, CKD admitted for speech difficulty and confusion. No TNK given due to outside window.    Stroke:  right MCA infarcts mostly in the R BG likely secondary to large vessel disease  source, although cardioembolic source can not be completely ruled out CT R BG infarct MRI  2 cm acute ischemic right MCA territory infarct involving the right basal ganglia. Few additional punctate foci of acute ischemia at the adjacent right frontotemporal region. Few small remote lacunar infarcts involving the left caudate nucleus and left thalamus. CTA head and neck Occlusion of the right M1 segment with moderate reconstitution of the M2 inferior division. Moderate right and mild left intracranial ICA stenoses. CTP 0/36cc 2D Echo  EF 60-65% Recommend 30 day cardiac event monitoring as outpt to rule out afib LDL 98 HgbA1c pending SCDs for VTE prophylaxis aspirin  81 mg daily prior to admission, now on aspirin  81 mg daily and clopidogrel  75 mg daily DAPT for 3 months given R M1 occlusion and then plavix  alone.  Patient counseled to be compliant with his antithrombotic medications Ongoing aggressive stroke risk factor management Therapy recommendations:  *** Disposition:  ***  Diabetes HgbA1c *** goal < 7.0 Unc***Controlled Currently on *** CBG monitoring SSI DM education and close PCP follow up  Hypertension Uns***Stable Permissive hypertension (OK if <220/120) for 24-48 hours post stroke and then gradually normalized within 5-7 days. Long term BP goal ***  Hyperlipidemia Home meds:  ***  LDL ***, goal < 70 Now on *** Continue statin at discharge  Other Stroke Risk Factors ***Advanced age ***Cigarette smoker, advised to stop smoking ***ETOH use ***Obesity, Body mass index is 32.04 kg/m.  ***Hx stroke/TIA ***Family hx stroke (***) ***Coronary artery disease ***Migraines ***Obstructive sleep apnea, on CPAP at home  Other Active Problems ***  Hospital day # 0    Ary Cummins, MD PhD Stroke Neurology  02/01/2024 11:50 PM    To contact Stroke Continuity provider, please refer to [email or web address]. After hours, contact General Neurology

## 2024-02-01 NOTE — Progress Notes (Addendum)
 STROKE TEAM PROGRESS NOTE   SUBJECTIVE (INTERVAL HISTORY) His daughter is at the bedside and wife is on the phone.  Overall his condition is rapidly improving. Pt stated that his speech has back to baseline (although still mild dysarthria) and no more confusion.    OBJECTIVE Temp:  [97.3 F (36.3 C)-98.6 F (37 C)] 97.8 F (36.6 C) (11/27 1938) Pulse Rate:  [52-62] 52 (11/27 1938) Cardiac Rhythm: Sinus bradycardia (11/27 1901) Resp:  [16-18] 17 (11/27 1938) BP: (132-146)/(54-66) 136/58 (11/27 1938) SpO2:  [97 %-99 %] 99 % (11/27 1938) Weight:  [98.4 kg] 98.4 kg (11/27 0404)  Recent Labs  Lab 01/31/24 2242 02/01/24 0620 02/01/24 1127 02/01/24 1528 02/01/24 2242  GLUCAP 112* 126* 224* 139* 131*   Recent Labs  Lab 01/31/24 1513 02/01/24 0117  NA 136 137  K 4.6 4.3  CL 102 104  CO2 24 20*  GLUCOSE 176* 194*  BUN 36* 34*  CREATININE 2.28* 2.25*  CALCIUM  9.5 8.5*  MG  --  2.3   Recent Labs  Lab 01/31/24 1513 02/01/24 0117  AST 16 15  ALT 14 13  ALKPHOS 85 67  BILITOT 0.5 0.8  PROT 7.0 6.0*  ALBUMIN 3.9 3.0*   Recent Labs  Lab 01/31/24 1513 02/01/24 0117  WBC 6.7 6.9  NEUTROABS 4.6 4.8  HGB 12.2* 11.6*  HCT 36.6* 35.7*  MCV 85.3 85.0  PLT 224 214   No results for input(s): CKTOTAL, CKMB, CKMBINDEX, TROPONINI in the last 168 hours. Recent Labs    01/31/24 1513  LABPROT 15.5*  INR 1.2   Recent Labs    01/31/24 1513  COLORURINE YELLOW  LABSPEC 1.022  PHURINE 6.0  GLUCOSEU >1,000*  HGBUR NEGATIVE  BILIRUBINUR NEGATIVE  KETONESUR NEGATIVE  PROTEINUR NEGATIVE  NITRITE NEGATIVE  LEUKOCYTESUR NEGATIVE       Component Value Date/Time   CHOL 158 02/01/2024 0117   TRIG 151 (H) 02/01/2024 0117   HDL 30 (L) 02/01/2024 0117   CHOLHDL 5.3 02/01/2024 0117   VLDL 30 02/01/2024 0117   LDLCALC 98 02/01/2024 0117   Lab Results  Component Value Date   HGBA1C 8.3 (A) 11/01/2023      Component Value Date/Time   LABOPIA NEGATIVE 01/31/2024  1513   COCAINSCRNUR NEGATIVE 01/31/2024 1513   LABBENZ NEGATIVE 01/31/2024 1513   AMPHETMU NEGATIVE 01/31/2024 1513   THCU NEGATIVE 01/31/2024 1513   LABBARB NEGATIVE 01/31/2024 1513    No results for input(s): ETH in the last 168 hours.  I have personally reviewed the radiological images below and agree with the radiology interpretations.  ECHOCARDIOGRAM COMPLETE Result Date: 02/01/2024    ECHOCARDIOGRAM REPORT   Patient Name:   AMEER SANDEN Voong Date of Exam: 02/01/2024 Medical Rec #:  [id number]       Height:       69.0 in Accession #:    [id number]      Weight:       216.9 lb Date of Birth:  [birth year]       BSA:          2.139 m Patient Age:    78 years        BP:           146/66 mmHg Patient Gender: M               HR:           56 bpm. Exam Location:  Inpatient Procedure: 2D Echo, Cardiac Doppler and Color Doppler (  Both Spectral and Color            Flow Doppler were utilized during procedure). Indications:    TIA  History:        Patient has no prior history of Echocardiogram examinations.                 CHF, CAD, TIA; Risk Factors:Diabetes, Dyslipidemia,                 Hypertension, Former Smoker and Sleep Apnea.  Sonographer:    Juliene Rucks Referring Phys: [id number] EVA KATHEE PORE  Sonographer Comments: Patient is obese. IMPRESSIONS  1. Left ventricular ejection fraction, by estimation, is 60 to 65%. The left ventricle has normal function. The left ventricle has no regional Guinta motion abnormalities. Left ventricular diastolic parameters are consistent with Grade I diastolic dysfunction (impaired relaxation).  2. Right ventricular systolic function is normal. The right ventricular size is normal.  3. The mitral valve is normal in structure. Trivial mitral valve regurgitation. No evidence of mitral stenosis.  4. The aortic valve is normal in structure. There is moderate calcification of the aortic valve. Aortic valve regurgitation is not visualized. Aortic valve sclerosis/calcification is  present, without any evidence of aortic stenosis.  5. The inferior vena cava is normal in size with greater than 50% respiratory variability, suggesting right atrial pressure of 3 mmHg. FINDINGS  Left Ventricle: Left ventricular ejection fraction, by estimation, is 60 to 65%. The left ventricle has normal function. The left ventricle has no regional Laatsch motion abnormalities. The left ventricular internal cavity size was normal in size. There is  no left ventricular hypertrophy. Left ventricular diastolic parameters are consistent with Grade I diastolic dysfunction (impaired relaxation). Right Ventricle: The right ventricular size is normal. No increase in right ventricular Frangos thickness. Right ventricular systolic function is normal. Left Atrium: Left atrial size was normal in size. Right Atrium: Right atrial size was normal in size. Pericardium: There is no evidence of pericardial effusion. Mitral Valve: The mitral valve is normal in structure. Trivial mitral valve regurgitation. No evidence of mitral valve stenosis. Tricuspid Valve: The tricuspid valve is normal in structure. Tricuspid valve regurgitation is trivial. No evidence of tricuspid stenosis. Aortic Valve: The aortic valve is normal in structure. There is moderate calcification of the aortic valve. Aortic valve regurgitation is not visualized. Aortic valve sclerosis/calcification is present, without any evidence of aortic stenosis. Pulmonic Valve: The pulmonic valve was grossly normal. Pulmonic valve regurgitation is not visualized. No evidence of pulmonic stenosis. Aorta: The aortic root is normal in size and structure. Venous: The inferior vena cava is normal in size with greater than 50% respiratory variability, suggesting right atrial pressure of 3 mmHg. IAS/Shunts: No atrial level shunt detected by color flow Doppler.  LEFT VENTRICLE PLAX 2D LVIDd:         4.90 cm   Diastology LVIDs:         3.50 cm   LV e' medial:    5.66 cm/s LV PW:         1.10  cm   LV E/e' medial:  16.4 LV IVS:        1.00 cm   LV e' lateral:   8.16 cm/s LVOT diam:     2.00 cm   LV E/e' lateral: 11.4 LV SV:         86 LV SV Index:   40 LVOT Area:     3.14 cm  RIGHT VENTRICLE RV S  prime:     11.70 cm/s TAPSE (M-mode): 2.0 cm LEFT ATRIUM             Index        RIGHT ATRIUM           Index LA diam:        5.10 cm 2.38 cm/m   RA Area:     14.90 cm LA Vol (A2C):   64.4 ml 30.11 ml/m  RA Volume:   40.80 ml  19.08 ml/m LA Vol (A4C):   51.0 ml 23.85 ml/m LA Biplane Vol: 58.5 ml 27.35 ml/m  AORTIC VALVE LVOT Vmax:   108.00 cm/s LVOT Vmean:  79.700 cm/s LVOT VTI:    0.274 m  AORTA Ao Root diam: 3.20 cm MITRAL VALVE MV Area (PHT): 3.17 cm    SHUNTS MV Decel Time: 239 msec    Systemic VTI:  0.27 m MV E velocity: 92.80 cm/s  Systemic Diam: 2.00 cm MV A velocity: 97.50 cm/s MV E/A ratio:  0.95 Toribio Fuel MD Electronically signed by Toribio Fuel MD Signature Date/Time: 02/01/2024/11:39:23 AM    Final    MR BRAIN WO CONTRAST Result Date: 01/31/2024 CLINICAL DATA:  Initial evaluation for acute neuro deficit, stroke. EXAM: MRI HEAD WITHOUT CONTRAST TECHNIQUE: Multiplanar, multiecho pulse sequences of the brain and surrounding structures were obtained without intravenous contrast. COMPARISON:  CTs from earlier the same day. FINDINGS: Brain: Cerebral volume within normal limits. 2 cm focus of restricted diffusion involving the right basal ganglia, consistent with an acute ischemic right MCA territory infarct. Few additional punctate foci of acute ischemia noted at the adjacent right frontotemporal region (series 2, image 26). No associated hemorrhage or mass effect. No other evidence for acute or subacute ischemia. Gray-white matter differentiation otherwise maintained. No other areas of chronic cortical infarction. No acute or chronic intracranial blood products. Few small remote lacunar infarcts noted at the left caudate nucleus and left thalamus. No mass lesion, midline shift or  mass effect. No hydrocephalus or extra-axial fluid collection. Pituitary gland within normal limits. Vascular: Major intracranial vascular flow voids are maintained. Skull and upper cervical spine: Craniocervical junction within normal limits. Bone marrow signal intensity normal. No scalp soft tissue abnormality. Sinuses/Orbits: Prior bilateral ocular lens replacement. Paranasal sinuses are largely clear. No significant mastoid effusion. 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 IMPRESSION: 1. 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 No associated hemorrhage or mass effect. 2. Few additional punctate foci of acute ischemia at the adjacent right frontotemporal region. 3. Few small remote lacunar infarcts involving the left caudate nucleus and left thalamus. 4. 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 Electronically Signed   By: Morene Hoard M.D.   On: 01/31/2024 22:25   CT CEREBRAL PERFUSION W CONTRAST Result Date: 01/31/2024 EXAM: CT BRAIN PERFUSION 01/31/2024 05:54:52 PM TECHNIQUE: Cerebral perfusion analysis using computed tomography with contrast administration, including post-processing of parametric maps with determination of cerebral blood flow, cerebral blood volume, mean transit time and time-to-maximum. Automated exposure control, iterative reconstruction, and/or weight based adjustment of the mA/kV was utilized to reduce the radiation dose to as low as reasonably achievable COMPARISON: CTA head and neck 01/31/2024. CLINICAL HISTORY: Ischemic stroke FINDINGS: CT PERFUSION: EXAM QUALITY: The  examination is adequate with diagnostic perfusion maps. No significant motion artifact. Appropriate arterial inflow and  venous outflow curves. CORE INFARCT (CBF<30% volume): 0 mL TOTAL HYPOPERFUSION (Tmax>6s volume): 36 mL PENUMBRA: Mismatch volume: 36 mL Mismatch ratio: Not applicable Location: Right MCA territory. IMPRESSION: 1. Right MCA ischemia. The reported mismatch/penumbra volume of 36 mL is a mild overestimation as the known right basal ganglia core infarct was not detected. Electronically signed by: Dasie Hamburg MD 01/31/2024 06:06 PM EST RP Workstation: [id number]   CT ANGIO HEAD NECK W WO CM Result Date: 01/31/2024 EXAM: CT HEAD WITHOUT CTA HEAD AND NECK WITH AND WITHOUT 01/31/2024 04:53:52 PM TECHNIQUE: CTA of the head and neck was performed with and without the administration of 75 mL of intravenous iohexol  (OMNIPAQUE ) 350 MG/ML injection. Noncontrast CT of the head with reconstructed 2-D images are also provided for review. Multiplanar 2D and/or 3D reformatted images are provided for review. Automated exposure control, iterative reconstruction, and/or weight based adjustment of the mA/kV was utilized to reduce the radiation dose to as low as reasonably achievable. COMPARISON: None available CLINICAL HISTORY: Stroke, follow up; aphasia, L facial droop x3days. FINDINGS: CT HEAD: BRAIN AND VENTRICLES: Hypodensity in the right basal ganglia spanning the caudate and lenticula and intervening white matter is suggestive of an acute infarct. No acute cortically based infarct, intracranial hemorrhage, mass, midline shift, hydrocephalus, or extra axial fluid collection is identified. A chronic lacunar infarct is noted in the left caudate head. Cerebral volume is within normal limits for age. Mild bilateral cerebral white matter hypodensities are nonspecific but compatible with chronic small vessel ischemic disease. ORBITS: Bilateral cataract extraction. SINUSES AND MASTOIDS: No acute abnormality. CTA NECK: AORTIC ARCH AND ARCH VESSELS: Normal variant aortic arch branching pattern with common origin of the brachiocephalic and  left common carotid arteries. No dissection or arterial injury. No significant stenosis of the brachiocephalic or subclavian arteries. CERVICAL CAROTID ARTERIES: Mixed calcified and soft plaque in the carotid bulbs without a significant stenosis on the left. There is mild narrowing of the proximal right ICA, however the more distal ICA is small in caliber diffusely and there is no significant stenosis by NASCET criteria. CERVICAL VERTEBRAL ARTERIES: The vertebral arteries are patent with the right being mildly dominant. Streak artifact limits assessment of the right vertebral artery origin, and an underlying significant stenosis is not excluded. There is no evidence of a significant stenosis or dissection elsewhere in the neck. LUNGS AND MEDIASTINUM: Unremarkable. SOFT TISSUES: Small thyroid  nodules measuring up to 9 mm for which no follow up imaging is required. BONES: Advanced cervical spondylosis. Interbody and posterior element ankylosis from C2-C4 and C5-C7. Severe left facet arthrosis at C4-C5 with resultant severe left neural foraminal stenosis. Mild spinal stenosis at C4-C5. CTA HEAD: ANTERIOR CIRCULATION: The intracranial internal carotid arteries are patent with the right carotid artery being diffusely smaller than the left, likely developmental given hypoplasia of the right A1 segment. Siphon atherosclerosis results in up to moderate stenosis on the right and mild stenosis on the left. There is occlusion of the right M1 segment with moderate reconstitution of the M2 inferior division. The left MCA and both ACAs are patent with mild atherosclerotic irregularity but no evidence of a proximal branch occlusion or significant proximal stenosis. No aneurysm. POSTERIOR CIRCULATION: The intracranial vertebral arteries are widely patent to the basilar. Patent left PICA, right AICA, and bilateral SCA origins are visualized. The basilar artery is widely patent. Posterior communicating arteries are diminutive or  absent. Both PCAs  are patent without evidence of a significant proximal stenosis. No aneurysm. OTHER: No dural venous sinus thrombosis on this non-dedicated study. These results were communicated by telephone to Dr. Pamella on 01/31/2024 at 5:09 PM. IMPRESSION: 1. 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 2. 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 3. Moderate right and mild left intracranial ICA stenoses. 4. Mild cervical carotid atherosclerosis without a significant stenosis. 5. Patent vertebral arteries with limited assessment of the right vertebral origin. Electronically signed by: Dasie Hamburg MD 01/31/2024 05:20 PM EST RP Workstation: [id number]     PHYSICAL EXAM  Temp:  [97.3 F (36.3 C)-98.6 F (37 C)] 97.8 F (36.6 C) (11/27 1938) Pulse Rate:  [52-62] 52 (11/27 1938) Resp:  [16-18] 17 (11/27 1938) BP: (132-146)/(54-66) 136/58 (11/27 1938) SpO2:  [97 %-99 %] 99 % (11/27 1938) Weight:  [98.4 kg] 98.4 kg (11/27 0404)  General - Well nourished, well developed, in no apparent distress.  Ophthalmologic - fundi not visualized due to noncooperation.  Cardiovascular - Regular rhythm and rate.  Neuro - awake, alert, slight lethargic, eyes open, orientated to age, place, time and people. No aphasia, paucity of speech but answered questions appropriately, following all simple commands. Able to name and repeat with slight psychomotor slowing. No gaze palsy, tracking bilaterally, visual field full. Mild L facial droop. Tongue midline. Bilateral UEs 5/5, no drift. Bilaterally LEs 4/5, no drift. Sensation symmetrical bilaterally, b/l FTN intact but mildly slow, gait not tested.    ASSESSMENT/PLAN Mr. TERRIN MEDDAUGH is a 78 y.o. male with history of HTN, DM, HLD, CAD, PAD, CKD admitted for speech difficulty and confusion. No TNK given due to outside window.    Stroke:  right MCA infarcts mostly in the R BG likely secondary to large vessel disease  source, although cardioembolic source can not be completely ruled out CT R BG infarct MRI  2 cm acute ischemic right MCA territory infarct involving the right basal ganglia. Few additional punctate foci of acute ischemia at the adjacent right frontotemporal region. Few small remote lacunar infarcts involving the left caudate nucleus and left thalamus. CTA head and neck Occlusion of the right M1 segment with moderate reconstitution of the M2 inferior division. Moderate right and mild left intracranial ICA stenoses. CTP 0/36cc 2D Echo  EF 60-65% Recommend 30 day cardiac event monitoring as outpt to rule out afib LDL 98 HgbA1c pending UDS neg SCDs for VTE prophylaxis aspirin  81 mg daily prior to admission, now on aspirin  81 mg daily and clopidogrel  75 mg daily DAPT for 3 months given R M1 occlusion and then plavix  alone.  Patient counseled to be compliant with his antithrombotic medications Ongoing aggressive stroke risk factor management Therapy recommendations:  pending Disposition:  pending  Diabetes HgbA1c pending, goal < 7.0 Controlled CBG monitoring SSI DM education and close PCP follow up  Hypertension Stable on the high end Avoid low BP given R M1 occlusion Long term BP goal normotensive  Hyperlipidemia Home meds:  lovastatin  40  LDL 98, goal < 70 Now on lipitor 40 Continue statin at discharge  Other Stroke Risk Factors Advanced age Obesity, Body mass index is 32.04 kg/m.  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 Please call with questions. Pt will follow up with stroke clinic NP at [facility] in about 4 weeks. Thanks for the consult.   Ary Cummins, MD  PhD Stroke Neurology 02/01/2024 11:50 PM    To contact Stroke Continuity provider, please refer to [email or web address]. After hours, contact General Neurology

## 2024-02-01 NOTE — Plan of Care (Signed)
  Problem: Clinical Measurements: Goal: Ability to maintain clinical measurements within normal limits will improve 02/01/2024 0516 by Gaetana Randall Mathew GORMAN, RN Outcome: Progressing 01/31/2024 2032 by Gaetana Randall Mathew GORMAN, RN Outcome: Progressing Goal: Will remain free from infection 02/01/2024 0516 by Gaetana Randall Mathew GORMAN, RN Outcome: Progressing 01/31/2024 2032 by Gaetana Randall Mathew GORMAN, RN Outcome: Progressing Goal: Diagnostic test results will improve 02/01/2024 0516 by Gaetana Randall Mathew GORMAN, RN Outcome: Progressing 01/31/2024 2032 by Gaetana Randall Mathew GORMAN, RN Outcome: Progressing Goal: Respiratory complications will improve 02/01/2024 0516 by Gaetana Randall Mathew GORMAN, RN Outcome: Progressing 01/31/2024 2032 by Gaetana Randall Mathew GORMAN, RN Outcome: Progressing Goal: Cardiovascular complication will be avoided 02/01/2024 0516 by Gaetana Randall Mathew GORMAN, RN Outcome: Progressing 01/31/2024 2032 by Gaetana Randall Mathew GORMAN, RN Outcome: Progressing   Problem: Activity: Goal: Risk for activity intolerance will decrease 02/01/2024 0516 by Gaetana Randall Mathew GORMAN, RN Outcome: Progressing 01/31/2024 2032 by Gaetana Randall Mathew GORMAN, RN Outcome: Progressing   Problem: Elimination: Goal: Will not experience complications related to bowel motility 02/01/2024 0516 by Gaetana Randall Mathew GORMAN, RN Outcome: Progressing 01/31/2024 2032 by Gaetana Randall Mathew GORMAN, RN Outcome: Progressing Goal: Will not experience complications related to urinary retention 02/01/2024 0516 by Gaetana Randall Mathew GORMAN, RN Outcome: Progressing 01/31/2024 2032 by Gaetana Randall Mathew GORMAN, RN Outcome: Progressing   Problem: Pain Managment: Goal: General experience of comfort will improve and/or be controlled 02/01/2024 0516 by Gaetana Randall Mathew GORMAN, RN Outcome: Progressing 01/31/2024 2032 by Gaetana Randall Mathew GORMAN, RN Outcome: Progressing   Problem: Safety: Goal: Ability to remain free from injury will  improve 02/01/2024 0516 by Gaetana Randall Mathew GORMAN, RN Outcome: Progressing 01/31/2024 2032 by Gaetana Randall Mathew GORMAN, RN Outcome: Progressing   Problem: Skin Integrity: Goal: Risk for impaired skin integrity will decrease 02/01/2024 0516 by Gaetana Randall Mathew GORMAN, RN Outcome: Progressing 01/31/2024 2032 by Gaetana Randall Mathew GORMAN, RN Outcome: Progressing

## 2024-02-02 ENCOUNTER — Other Ambulatory Visit: Payer: Self-pay | Admitting: Student

## 2024-02-02 DIAGNOSIS — I639 Cerebral infarction, unspecified: Secondary | ICD-10-CM

## 2024-02-02 DIAGNOSIS — I63511 Cerebral infarction due to unspecified occlusion or stenosis of right middle cerebral artery: Secondary | ICD-10-CM | POA: Diagnosis not present

## 2024-02-02 LAB — HEMOGLOBIN A1C
Hgb A1c MFr Bld: 8.3 % — ABNORMAL HIGH (ref 4.8–5.6)
Mean Plasma Glucose: 192 mg/dL

## 2024-02-02 LAB — BASIC METABOLIC PANEL WITH GFR
Anion gap: 11 (ref 5–15)
BUN: 30 mg/dL — ABNORMAL HIGH (ref 8–23)
CO2: 21 mmol/L — ABNORMAL LOW (ref 22–32)
Calcium: 9 mg/dL (ref 8.9–10.3)
Chloride: 104 mmol/L (ref 98–111)
Creatinine, Ser: 2.31 mg/dL — ABNORMAL HIGH (ref 0.61–1.24)
GFR, Estimated: 28 mL/min — ABNORMAL LOW (ref >60)
Glucose, Bld: 184 mg/dL — ABNORMAL HIGH (ref 70–99)
Potassium: 4.5 mmol/L (ref 3.5–5.1)
Sodium: 136 mmol/L (ref 135–145)

## 2024-02-02 LAB — GLUCOSE, CAPILLARY: Glucose-Capillary: 146 mg/dL — ABNORMAL HIGH (ref 70–99)

## 2024-02-02 MED ORDER — AMLODIPINE BESYLATE 5 MG PO TABS: 5.0000 mg | ORAL_TABLET | Freq: Every day | ORAL | 11 refills | Status: AC

## 2024-02-02 MED ORDER — ATORVASTATIN CALCIUM 40 MG PO TABS: 40.0000 mg | ORAL_TABLET | Freq: Every day | ORAL | 0 refills | Status: DC

## 2024-02-02 MED ORDER — ASPIRIN 81 MG PO TBEC: 81.0000 mg | DELAYED_RELEASE_TABLET | Freq: Every day | ORAL | 2 refills | Status: AC

## 2024-02-02 MED ORDER — CLOPIDOGREL BISULFATE 75 MG PO TABS: 75.0000 mg | ORAL_TABLET | Freq: Every day | ORAL | 3 refills | Status: AC

## 2024-02-02 NOTE — Progress Notes (Signed)
 OT Cancellation Note and Discharge  Patient Details Name: Johnny [name] MRN: [id number] DOB: 11-Apr-1945   Cancelled Treatment:    Reason Eval/Treat Not Completed: OT screened, no needs identified, will sign off. Pt seen by PT and they reported no OT needs identified. They did note that pt reported that he has had word finding issues intermittently for a couple of months now. SLP has recommended OP SLP for dysphonia and this should also be able to be addressed by OP SLP if it continues to re-occur.   Donny BECKER OT Acute Rehabilitation Services Office [phone]    [place] Dorothyann Distel 02/02/2024, 9:02 AM

## 2024-02-02 NOTE — Evaluation (Signed)
 Physical Therapy Evaluation Patient Details Name: Johnny [name] MRN: [id number] DOB: 1945/08/06 Today's Date: 02/02/2024  History of Present Illness  78 y.o. male presents to [facility] 01/31/24 with expressive aphasia. CT head showed R basal ganglia infarct. DM2, essential hypertension, hyperlipidemia, chronic systolic heart failure, CKD 3B with baseline creatinine 2-2.3, anemia of chronic kidney disease with baseline hemoglobin 12-13.5   Clinical Impression  PTA pt was independent for mobility with no AD and working as a international aid/development worker. Pt reported being close to baseline with self reported impaired cognition and balance deficits. Pt was ModI to stand with no AD and supervision to ambulate 382ft. Pt with slow and hesitant steps, however, reported this is baseline. Pt scored a 14/24 on the DGI showing that pt is at an increased risk of falling. Pt had one loss of balance when turning quickly, however, was able to recover with a lateral sidestep and no physical assist. Pt will have 24/7 assist available upon d/c home. Recommending OP PT to address balance deficits. Will continue to follow acutely.       If plan is discharge home, recommend the following: A little help with walking and/or transfers;Assist for transportation;Help with stairs or ramp for entrance   Can travel by private vehicle    Yes    Equipment Recommendations Rolling walker (2 wheels)     Functional Status Assessment Patient has had a recent decline in their functional status and demonstrates the ability to make significant improvements in function in a reasonable and predictable amount of time.     Precautions / Restrictions Precautions Precautions: Fall Recall of Precautions/Restrictions: Intact Restrictions Weight Bearing Restrictions Per Provider Order: No      Mobility  Bed Mobility Overal bed mobility: Needs Assistance Bed Mobility: Supine to Sit    Supine to sit: Min assist    General bed  mobility comments: MinA for assist to complete trunk raise    Transfers Overall transfer level: Needs assistance Equipment used: None Transfers: Sit to/from Stand Sit to Stand: Modified independent (Device/Increase time)     Ambulation/Gait Ambulation/Gait assistance: Supervision Gait Distance (Feet): 300 Feet Assistive device: None Gait Pattern/deviations: Step-through pattern, Decreased stride length Gait velocity: decr    General Gait Details: slightly unsteady with no AD. Small lateral loss of balance when turning quickly with pt able to recover with side step and no physical assist  Stairs Stairs: Yes Stairs assistance: Modified independent (Device/Increase time) Stair Management: One rail Right, One rail Left, Step to pattern, Forwards Number of Stairs: 3 General stair comments: steady with use of bilateral handrails  Modified Rankin (Stroke Patients Only) Modified Rankin (Stroke Patients Only) Pre-Morbid Rankin Score: No symptoms Modified Rankin: Moderately severe disability     Balance Overall balance assessment: Needs assistance, History of Falls Sitting-balance support: No upper extremity supported, Feet supported Sitting balance-Leahy Scale: Good     Standing balance support: No upper extremity supported Standing balance-Leahy Scale: Fair    Standardized Balance Assessment Standardized Balance Assessment : Dynamic Gait Index   Dynamic Gait Index Level Surface: Mild Impairment Change in Gait Speed: Moderate Impairment Gait with Horizontal Head Turns: Mild Impairment Gait with Vertical Head Turns: Mild Impairment Gait and Pivot Turn: Mild Impairment Step Over Obstacle: Mild Impairment Step Around Obstacles: Mild Impairment Steps: Moderate Impairment Total Score: 14       Pertinent Vitals/Pain Pain Assessment Pain Assessment: No/denies pain    Home Living Family/patient expects to be discharged to:: Private residence Living Arrangements:  Spouse/significant other Available Help at Discharge: Family;Available 24 hours/day Type of Home: House Home Access: Stairs to enter Entrance Stairs-Rails: Right;Left;Can reach both Entrance Stairs-Number of Steps: 3   Home Layout: One level Home Equipment: Grab bars - tub/shower;Rollator (4 wheels)      Prior Function Prior Level of Function : Independent/Modified Independent;Driving;Working/employed;History of Falls (last six months)    Mobility Comments: Ind with no AD, 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 One fall due to sliding on leaves       Extremity/Trunk Assessment   Upper Extremity Assessment Upper Extremity Assessment: Defer to OT evaluation    Lower Extremity Assessment Lower Extremity Assessment: Generalized weakness    Cervical / Trunk Assessment Cervical / Trunk Assessment: Normal  Communication   Communication Communication: No apparent difficulties    Cognition Arousal: Alert Behavior During Therapy: WFL for tasks assessed/performed   PT - Cognitive impairments: No apparent impairments    Following commands: Intact       Cueing Cueing Techniques: Verbal cues     General Comments General comments (skin integrity, edema, etc.): Educated on BE FAST acronym     PT Assessment Patient needs continued PT services  PT Problem List Decreased strength;Decreased balance;Decreased activity tolerance;Decreased mobility       PT Treatment Interventions DME instruction;Gait training;Stair training;Functional mobility training;Therapeutic activities;Therapeutic exercise;Balance training;Neuromuscular re-education;Patient/family education    PT Goals (Current goals can be found in the Care Plan section)  Acute Rehab PT Goals Patient Stated Goal: to work on balance PT Goal Formulation: With patient/family Time For Goal Achievement: 02/16/24 Potential to Achieve Goals: Good    Frequency Min 2X/week        AM-PAC PT 6 Clicks  Mobility  Outcome Measure Help needed turning from your back to your side while in a flat bed without using bedrails?: None Help needed moving from lying on your back to sitting on the side of a flat bed without using bedrails?: A Little Help needed moving to and from a bed to a chair (including a wheelchair)?: A Little Help needed standing up from a chair using your arms (e.g., wheelchair or bedside chair)?: A Little Help needed to walk in hospital room?: A Little Help needed climbing 3-5 steps with a railing? : A Little 6 Click Score: 19    End of Session Equipment Utilized During Treatment: Gait belt Activity Tolerance: Patient tolerated treatment well Patient left: in bed;with call bell/phone within reach;with bed alarm set;with family/visitor present Nurse Communication: Mobility status PT Visit Diagnosis: Unsteadiness on feet (R26.81);Other abnormalities of gait and mobility (R26.89);Muscle weakness (generalized) (M62.81);History of falling (Z91.81)    Time: 9199-9169 PT Time Calculation (min) (ACUTE ONLY): 30 min   Charges:   PT Evaluation $PT Eval Low Complexity: 1 Low PT Treatments $Therapeutic Activity: 8-22 mins PT General Charges $$ ACUTE PT VISIT: 1 Visit        Kate ORN, PT, DPT Secure Chat Preferred  Rehab Office [phone]   Kate BRAVO Wendolyn 02/02/2024, 8:38 AM

## 2024-02-02 NOTE — Plan of Care (Signed)
 Problem: Education: Goal: Knowledge of General Education information will improve Description: Including pain rating scale, medication(s)/side effects and non-pharmacologic comfort measures Outcome: Adequate for Discharge   Problem: Health Behavior/Discharge Planning: Goal: Ability to manage health-related needs will improve Outcome: Adequate for Discharge   Problem: Clinical Measurements: Goal: Ability to maintain clinical measurements within normal limits will improve Outcome: Adequate for Discharge Goal: Will remain free from infection Outcome: Adequate for Discharge Goal: Diagnostic test results will improve Outcome: Adequate for Discharge Goal: Respiratory complications will improve Outcome: Adequate for Discharge Goal: Cardiovascular complication will be avoided Outcome: Adequate for Discharge   Problem: Activity: Goal: Risk for activity intolerance will decrease Outcome: Adequate for Discharge   Problem: Nutrition: Goal: Adequate nutrition will be maintained Outcome: Adequate for Discharge   Problem: Coping: Goal: Level of anxiety will decrease Outcome: Adequate for Discharge   Problem: Elimination: Goal: Will not experience complications related to bowel motility Outcome: Adequate for Discharge Goal: Will not experience complications related to urinary retention Outcome: Adequate for Discharge   Problem: Pain Managment: Goal: General experience of comfort will improve and/or be controlled Outcome: Adequate for Discharge   Problem: Safety: Goal: Ability to remain free from injury will improve Outcome: Adequate for Discharge   Problem: Skin Integrity: Goal: Risk for impaired skin integrity will decrease Outcome: Adequate for Discharge   Problem: Education: Goal: Knowledge of disease or condition will improve Outcome: Adequate for Discharge Goal: Knowledge of secondary prevention will improve (MUST DOCUMENT ALL) Outcome: Adequate for Discharge Goal:  Knowledge of patient specific risk factors will improve (DELETE if not current risk factor) Outcome: Adequate for Discharge   Problem: Ischemic Stroke/TIA Tissue Perfusion: Goal: Complications of ischemic stroke/TIA will be minimized Outcome: Adequate for Discharge   Problem: Coping: Goal: Will verbalize positive feelings about self Outcome: Adequate for Discharge Goal: Will identify appropriate support needs Outcome: Adequate for Discharge   Problem: Health Behavior/Discharge Planning: Goal: Ability to manage health-related needs will improve Outcome: Adequate for Discharge Goal: Goals will be collaboratively established with patient/family Outcome: Adequate for Discharge   Problem: Self-Care: Goal: Ability to participate in self-care as condition permits will improve Outcome: Adequate for Discharge Goal: Verbalization of feelings and concerns over difficulty with self-care will improve Outcome: Adequate for Discharge Goal: Ability to communicate needs accurately will improve Outcome: Adequate for Discharge   Problem: Nutrition: Goal: Risk of aspiration will decrease Outcome: Adequate for Discharge Goal: Dietary intake will improve Outcome: Adequate for Discharge   Problem: Education: Goal: Ability to describe self-care measures that may prevent or decrease complications (Diabetes Survival Skills Education) will improve Outcome: Adequate for Discharge Goal: Individualized Educational Video(s) Outcome: Adequate for Discharge   Problem: Coping: Goal: Ability to adjust to condition or change in health will improve Outcome: Adequate for Discharge   Problem: Fluid Volume: Goal: Ability to maintain a balanced intake and output will improve Outcome: Adequate for Discharge   Problem: Health Behavior/Discharge Planning: Goal: Ability to identify and utilize available resources and services will improve Outcome: Adequate for Discharge Goal: Ability to manage health-related  needs will improve Outcome: Adequate for Discharge   Problem: Metabolic: Goal: Ability to maintain appropriate glucose levels will improve Outcome: Adequate for Discharge   Problem: Nutritional: Goal: Maintenance of adequate nutrition will improve Outcome: Adequate for Discharge Goal: Progress toward achieving an optimal weight will improve Outcome: Adequate for Discharge   Problem: Skin Integrity: Goal: Risk for impaired skin integrity will decrease Outcome: Adequate for Discharge   Problem: Tissue Perfusion: Goal: Adequacy  of tissue perfusion will improve Outcome: Adequate for Discharge

## 2024-02-02 NOTE — Progress Notes (Signed)
 Ordered 30 day Event [facility] for further evaluation of stroke at the request of Neurology.   Kelee Cunningham E Jahden Schara, PA-C 02/02/2024 11:05 AM

## 2024-02-02 NOTE — Discharge Summary (Signed)
 Physician Discharge Summary   Patient: Johnny [name] MRN: [id number] DOB: 04-25-1945  Admit date:     01/31/2024  Discharge date: 02/02/24  Discharge Physician: Owen DELENA Lore   PCP: Garald Karlynn GAILS, MD   Recommendations at discharge:   Needs B-met to monitor renal function, consider resumption of ARB and or hydrochlorothiazide  if stable.  Needs follow up with neurology for follow up on stroke.  Needs to discussed with cardiology if he will need to remain on aspirin  from heart perspective.  Cardiology will arrange holter monitor.  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  Discharge Diagnoses: Principal Problem:   Acute ischemic cerebrovascular accident (CVA) involving right middle cerebral artery territory [facility]) Active Problems:   HLD (hyperlipidemia)   DM2 (diabetes mellitus, type 2) (HCC)   BPH (benign prostatic hyperplasia)   Expressive aphasia   History of essential hypertension   Chronic systolic CHF (congestive heart failure) (HCC)   CKD stage 3b, GFR 30-44 ml/min (HCC)   History of anemia due to chronic kidney disease   CVA (cerebral vascular accident) (HCC)  Resolved Problems:   * No resolved hospital problems. [facility]: 78 year old with past medical history significant for diabetes type 2, hypertension, hyperlipidemia, chronic systolic heart failure, CKD 3B baseline creatinine 2--2.3, anemia of chronic kidney disease with baseline hemoglobin 12 who presents complaining of expressive aphasia that  started 01/29/2024.  Symptoms resolved.  Patient subsequently on 01/30/2024 had recurrence of expressive aphasia associated with word finding difficulty, left facial droop.  Symptoms persist into the morning on 11/26 at which patient presented to the ED.   Evaluation in the ED CT head show acute infarct in the right basal ganglia.  CTA  head and neck show occlusion of the right M1 segment with moderate reconstitution of M2 inferior division, also showing moderate right mild left intracranial ICA stenosis, mild cervical carotid atherosclerosis without significant stenosis.   Patient evaluated by neurology, patient is out of window.  For tPA and not a candidate for thrombectomy.  Admitted for further evaluation of his stroke.    Assessment and Plan: 1-Acute ischemic right basal ganglia stroke: - CT angio head and neck: Acute infarct in the right basal ganglia. 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  Moderate right and mild left intracranial ICA stenoses. Mild cervical carotid atherosclerosis without a significant stenosis. Patent vertebral arteries with limited assessment of the right vertebral origin. -MRI Brain: 2 cm acute ischemic right MCA territory infarct involving the right basal ganglia.  No associated hemorrhage of mass effect.  Few additional punctate foci of acute ischemia to adjacent of the right frontal temporal region.  Few small remote lacunar infarct involving the left Without no close and left thalamus.  -Now on aspirin  and plavix  -ECHO; normal EF, no thrombus noticed.  -LDL 98, A1c: 8.8 Holter monitor outpatient. Cardiology will arrange.  He will be discharge on aspirin  and plavix  for 3 months, then plavix  alone. He will need to follow up with cardiology in regards if he will need to continue aspirin  as well.   2-Diabetes type 2: - Continue sliding scale insulin  - Resume Jardiance  and glipized.    3-Essential hypertension: - Permissive hypertension - Hold Irbesartan   and hydrochlorothiazide  in setting on recent contrast exposure and mild increase cr to 2.3 -start amlodipine in mean  time   Hyperlipidemia: - Pravachol --change to Lipitor.    BPH: - Continue Proscar    Chronic systolic heart failure -FU echo. Normal EF new echo euvolemic   CKD 3B: Cr  baseline -1.9 -2.0 Received fluids. 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 -Needs out patient follow up          Consultants: neurology  Procedures performed: ECHO Disposition: Home Diet recommendation:  Discharge Diet Orders (From admission, onward)     Start     Ordered   02/02/24 0000  Diet - low sodium heart healthy        02/02/24 1013           Carb modified diet DISCHARGE MEDICATION: Allergies as of 02/02/2024       Reactions   Enalapril  Shortness Of Breath   Hydrocodone-acetaminophen  Nausea Only   Can take codein        Medication List     STOP taking these medications    cilostazol  50 MG tablet Commonly known as: PLETAL    colchicine  0.6 MG tablet   irbesartan -hydrochlorothiazide  300-12.5 MG tablet Commonly known as: AVALIDE   lovastatin  40 MG tablet Commonly known as: MEVACOR        TAKE these medications    Accu-Chek Guide Me w/Device Kit Use as instructed to check blood sugars 1 times a day, fasting   Accu-Chek Guide Me w/Device Kit As directed   Blood Glucose Monitoring Suppl Devi 1 each by Does not apply route in the morning, at noon, and at bedtime. May substitute to any manufacturer covered by patient's insurance.   Accu-Chek Guide Test test strip Generic drug: glucose blood 1 each by Other route daily in the afternoon. Use as instructed   accu-chek softclix lancets Use twice daily as instructed. Dx: 250.00   albuterol  108 (90 Base) MCG/ACT inhaler Commonly known as: Ventolin  HFA INHALE 2 PUFFS INTO THE LUNGS EVERY 4 HOURS AS NEEDED FOR WHEEZING OR SHORTNESS OF BREATH   allopurinol  100 MG tablet Commonly known as: ZYLOPRIM  TAKE 1 TABLET EVERY DAY   amLODipine 5 MG tablet Commonly known as:  NORVASC Take 1 tablet (5 mg total) by mouth daily.   aspirin  EC 81 MG tablet Take 1 tablet (81 mg total) by mouth daily. Swallow whole. Start taking on: February 03, 2024 What changed: how much to take   atorvastatin  40 MG tablet Commonly known as: LIPITOR Take 1 tablet (40 mg total) by mouth daily. Start taking on: February 03, 2024   cholecalciferol 1000 units tablet Commonly known as: VITAMIN D Take 1,000 Units by mouth every morning.   clopidogrel  75 MG tablet Commonly known as: PLAVIX  Take 1 tablet (75 mg total) by mouth daily. Start taking on: February 03, 2024   DropSafe Alcohol  Prep 70 % Pads USE AS DIRECTED   empagliflozin  25 MG Tabs tablet Commonly known as: Jardiance  Take 1 tablet (25 mg total) by mouth daily before breakfast.   finasteride  5 MG tablet Commonly known as: PROSCAR  TAKE 1 TABLET EVERY MORNING   fluticasone  furoate-vilanterol 100-25 MCG/INH Aepb Commonly known as: Breo Ellipta  Inhale 1 puff into the lungs daily. What changed:  when to take this reasons to take this   glipiZIDE  10 MG tablet Commonly known as: GLUCOTROL  Take 2 tablets (20  mg total) by mouth daily before breakfast AND 1 tablet (10 mg total) daily before supper. Notes to patient: **Measure blood sugar before dose and do NOT take if blood sugar is below 160**   MULTI-VITAMIN GUMMIES PO Take 1 tablet by mouth daily.   nitroGLYCERIN  0.4 MG SL tablet Commonly known as: NITROSTAT  Place 1 tablet (0.4 mg total) under the tongue every 5 (five) minutes as needed for chest pain.   OneTouch Delica Plus Lancet33G Misc USE TWICE DAILY AS DIRECTED   Accu-Chek Softclix Lancets lancets Use as instructed to check blood sugars 1 times a day, fasting   pantoprazole  40 MG tablet Commonly known as: PROTONIX  TAKE 1 TABLET EVERY MORNING   sildenafil  100 MG tablet Commonly known as: Viagra  Take 0.5-1 tablets (50-100 mg total) by mouth daily as needed for erectile dysfunction.   tamsulosin   0.4 MG Caps capsule Commonly known as: FLOMAX  TAKE 1 CAPSULE EVERY DAY               Durable Medical Equipment  (From admission, onward)           Start     Ordered   02/02/24 1044  For home use only DME Walker  Once       Question:  Patient needs a walker to treat with the following condition  Answer:  Balance disorder   02/02/24 1043   02/02/24 1040  For home use only DME Walker rolling  Once       Question Answer Comment  Walker: With 5 Inch Wheels   Patient needs a walker to treat with the following condition Weakness      02/02/24 1040            Follow-up Information     [facility]. Schedule an appointment as soon as possible for a visit in 1 month(s).   Specialty: Neurology Why: stroke clinic Contact information: [address] [place] [address] [place]  [address] [phone]        Plotnikov, Karlynn GAILS, MD Follow up in 1 week(s).   Specialty: Internal Medicine Contact information: [address] [place] [address] [phone]         [facility] Follow up.   Why: Agency for walker Contact information: [address] [place] [address] [phone]                Discharge Exam: Fredricka Weights   01/31/24 1947 02/01/24 0404 02/02/24 0429  Weight: 98.4 kg 98.4 kg 97.5 kg   General; NAD  Condition at discharge: stable  The results of significant diagnostics from this hospitalization (including imaging, microbiology, ancillary and laboratory) are listed below for reference.   Imaging Studies: ECHOCARDIOGRAM COMPLETE Result Date: 02/01/2024    ECHOCARDIOGRAM REPORT   Patient Name:   Johnny [name] Date of Exam: 02/01/2024 Medical Rec #:  [id number]       Height:       69.0 in Accession #:    [id number]      Weight:       216.9 lb Date of Birth:  Oct 18, 1945       BSA:          2.139 m Patient Age:    78 years        BP:           146/66 mmHg Patient  Gender: M  HR:           56 bpm. Exam Location:  Inpatient Procedure: 2D Echo, Cardiac Doppler and Color Doppler (Both Spectral and Color            Flow Doppler were utilized during procedure). Indications:    TIA  History:        Patient has no prior history of Echocardiogram examinations.                 CHF, CAD, TIA; Risk Factors:Diabetes, Dyslipidemia,                 Hypertension, Former Smoker and Sleep Apnea.  Sonographer:    Juliene Rucks Referring Phys: [id number] EVA KATHEE PORE  Sonographer Comments: Patient is obese. IMPRESSIONS  1. Left ventricular ejection fraction, by estimation, is 60 to 65%. The left ventricle has normal function. The left ventricle has no regional Gervacio motion abnormalities. Left ventricular diastolic parameters are consistent with Grade I diastolic dysfunction (impaired relaxation).  2. Right ventricular systolic function is normal. The right ventricular size is normal.  3. The mitral valve is normal in structure. Trivial mitral valve regurgitation. No evidence of mitral stenosis.  4. The aortic valve is normal in structure. There is moderate calcification of the aortic valve. Aortic valve regurgitation is not visualized. Aortic valve sclerosis/calcification is present, without any evidence of aortic stenosis.  5. The inferior vena cava is normal in size with greater than 50% respiratory variability, suggesting right atrial pressure of 3 mmHg. FINDINGS  Left Ventricle: Left ventricular ejection fraction, by estimation, is 60 to 65%. The left ventricle has normal function. The left ventricle has no regional Velador motion abnormalities. The left ventricular internal cavity size was normal in size. There is  no left ventricular hypertrophy. Left ventricular diastolic parameters are consistent with Grade I diastolic dysfunction (impaired relaxation). Right Ventricle: The right ventricular size is normal. No increase in right ventricular Bones thickness. Right ventricular  systolic function is normal. Left Atrium: Left atrial size was normal in size. Right Atrium: Right atrial size was normal in size. Pericardium: There is no evidence of pericardial effusion. Mitral Valve: The mitral valve is normal in structure. Trivial mitral valve regurgitation. No evidence of mitral valve stenosis. Tricuspid Valve: The tricuspid valve is normal in structure. Tricuspid valve regurgitation is trivial. No evidence of tricuspid stenosis. Aortic Valve: The aortic valve is normal in structure. There is moderate calcification of the aortic valve. Aortic valve regurgitation is not visualized. Aortic valve sclerosis/calcification is present, without any evidence of aortic stenosis. Pulmonic Valve: The pulmonic valve was grossly normal. Pulmonic valve regurgitation is not visualized. No evidence of pulmonic stenosis. Aorta: The aortic root is normal in size and structure. Venous: The inferior vena cava is normal in size with greater than 50% respiratory variability, suggesting right atrial pressure of 3 mmHg. IAS/Shunts: No atrial level shunt detected by color flow Doppler.  LEFT VENTRICLE PLAX 2D LVIDd:         4.90 cm   Diastology LVIDs:         3.50 cm   LV e' medial:    5.66 cm/s LV PW:         1.10 cm   LV E/e' medial:  16.4 LV IVS:        1.00 cm   LV e' lateral:   8.16 cm/s LVOT diam:     2.00 cm   LV E/e' lateral: 11.4 LV SV:  86 LV SV Index:   40 LVOT Area:     3.14 cm  RIGHT VENTRICLE RV S prime:     11.70 cm/s TAPSE (M-mode): 2.0 cm LEFT ATRIUM             Index        RIGHT ATRIUM           Index LA diam:        5.10 cm 2.38 cm/m   RA Area:     14.90 cm LA Vol (A2C):   64.4 ml 30.11 ml/m  RA Volume:   40.80 ml  19.08 ml/m LA Vol (A4C):   51.0 ml 23.85 ml/m LA Biplane Vol: 58.5 ml 27.35 ml/m  AORTIC VALVE LVOT Vmax:   108.00 cm/s LVOT Vmean:  79.700 cm/s LVOT VTI:    0.274 m  AORTA Ao Root diam: 3.20 cm MITRAL VALVE MV Area (PHT): 3.17 cm    SHUNTS MV Decel Time: 239 msec     Systemic VTI:  0.27 m MV E velocity: 92.80 cm/s  Systemic Diam: 2.00 cm MV A velocity: 97.50 cm/s MV E/A ratio:  0.95 Toribio Fuel MD Electronically signed by Toribio Fuel MD Signature Date/Time: 02/01/2024/11:39:23 AM    Final    MR BRAIN WO CONTRAST Result Date: 01/31/2024 CLINICAL DATA:  Initial evaluation for acute neuro deficit, stroke. EXAM: MRI HEAD WITHOUT CONTRAST TECHNIQUE: Multiplanar, multiecho pulse sequences of the brain and surrounding structures were obtained without intravenous contrast. COMPARISON:  CTs from earlier the same day. FINDINGS: Brain: Cerebral volume within normal limits. 2 cm focus of restricted diffusion involving the right basal ganglia, consistent with an acute ischemic right MCA territory infarct. Few additional punctate foci of acute ischemia noted at the adjacent right frontotemporal region (series 2, image 26). No associated hemorrhage or mass effect. No other evidence for acute or subacute ischemia. Gray-white matter differentiation otherwise maintained. No other areas of chronic cortical infarction. No acute or chronic intracranial blood products. Few small remote lacunar infarcts noted at the left caudate nucleus and left thalamus. No mass lesion, midline shift or mass effect. No hydrocephalus or extra-axial fluid collection. Pituitary gland within normal limits. Vascular: Major intracranial vascular flow voids are maintained. Skull and upper cervical spine: Craniocervical junction within normal limits. Bone marrow signal intensity normal. No scalp soft tissue abnormality. Sinuses/Orbits: Prior bilateral ocular lens replacement. Paranasal sinuses are largely clear. No significant mastoid effusion. 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 IMPRESSION: 1. 2 cm acute ischemic right MCA territory infarct involving the right  basal ganglia. No associated hemorrhage or mass effect. 2. Few additional punctate foci of acute ischemia at the adjacent right frontotemporal region. 3. Few small remote lacunar infarcts involving the left caudate nucleus and left thalamus. 4. 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 Electronically Signed   By: Morene Hoard M.D.   On: 01/31/2024 22:25   CT CEREBRAL PERFUSION W CONTRAST Result Date: 01/31/2024 EXAM: CT BRAIN PERFUSION 01/31/2024 05:54:52 PM TECHNIQUE: Cerebral perfusion analysis using computed tomography with contrast administration, including post-processing of parametric maps with determination of cerebral blood flow, cerebral blood volume, mean transit time and time-to-maximum. Automated exposure control, iterative reconstruction, and/or weight based adjustment of the mA/kV was utilized to reduce the radiation dose to  as low as reasonably achievable COMPARISON: CTA head and neck 01/31/2024. CLINICAL HISTORY: Ischemic stroke FINDINGS: CT PERFUSION: EXAM QUALITY: The examination is adequate with diagnostic perfusion maps. No significant motion artifact. Appropriate arterial inflow and venous outflow curves. CORE INFARCT (CBF<30% volume): 0 mL TOTAL HYPOPERFUSION (Tmax>6s volume): 36 mL PENUMBRA: Mismatch volume: 36 mL Mismatch ratio: Not applicable Location: Right MCA territory. IMPRESSION: 1. Right MCA ischemia. The reported mismatch/penumbra volume of 36 mL is a mild overestimation as the known right basal ganglia core infarct was not detected. Electronically signed by: Dasie Hamburg MD 01/31/2024 06:06 PM EST RP Workstation: [id number]   CT ANGIO HEAD NECK W WO CM Result Date: 01/31/2024 EXAM: CT HEAD WITHOUT CTA HEAD AND NECK WITH AND WITHOUT 01/31/2024 04:53:52 PM TECHNIQUE: CTA of the head and neck was performed with and without the administration of 75 mL of intravenous  iohexol  (OMNIPAQUE ) 350 MG/ML injection. Noncontrast CT of the head with reconstructed 2-D images are also provided for review. Multiplanar 2D and/or 3D reformatted images are provided for review. Automated exposure control, iterative reconstruction, and/or weight based adjustment of the mA/kV was utilized to reduce the radiation dose to as low as reasonably achievable. COMPARISON: None available CLINICAL HISTORY: Stroke, follow up; aphasia, L facial droop x3days. FINDINGS: CT HEAD: BRAIN AND VENTRICLES: Hypodensity in the right basal ganglia spanning the caudate and lenticula and intervening white matter is suggestive of an acute infarct. No acute cortically based infarct, intracranial hemorrhage, mass, midline shift, hydrocephalus, or extra axial fluid collection is identified. A chronic lacunar infarct is noted in the left caudate head. Cerebral volume is within normal limits for age. Mild bilateral cerebral white matter hypodensities are nonspecific but compatible with chronic small vessel ischemic disease. ORBITS: Bilateral cataract extraction. SINUSES AND MASTOIDS: No acute abnormality. CTA NECK: AORTIC ARCH AND ARCH VESSELS: Normal variant aortic arch branching pattern with common origin of the brachiocephalic and left common carotid arteries. No dissection or arterial injury. No significant stenosis of the brachiocephalic or subclavian arteries. CERVICAL CAROTID ARTERIES: Mixed calcified and soft plaque in the carotid bulbs without a significant stenosis on the left. There is mild narrowing of the proximal right ICA, however the more distal ICA is small in caliber diffusely and there is no significant stenosis by NASCET criteria. CERVICAL VERTEBRAL ARTERIES: The vertebral arteries are patent with the right being mildly dominant. Streak artifact limits assessment of the right vertebral artery origin, and an underlying significant stenosis is not excluded. There is no evidence of a significant stenosis or  dissection elsewhere in the neck. LUNGS AND MEDIASTINUM: Unremarkable. SOFT TISSUES: Small thyroid  nodules measuring up to 9 mm for which no follow up imaging is required. BONES: Advanced cervical spondylosis. Interbody and posterior element ankylosis from C2-C4 and C5-C7. Severe left facet arthrosis at C4-C5 with resultant severe left neural foraminal stenosis. Mild spinal stenosis at C4-C5. CTA HEAD: ANTERIOR CIRCULATION: The intracranial internal carotid arteries are patent with the right carotid artery being diffusely smaller than the left, likely developmental given hypoplasia of the right A1 segment. Siphon atherosclerosis results in up to moderate stenosis on the right and mild stenosis on the left. There is occlusion of the right M1 segment with moderate reconstitution of the M2 inferior division. The left MCA and both ACAs are patent with mild atherosclerotic irregularity but no evidence of a proximal branch occlusion or significant proximal stenosis. No aneurysm. POSTERIOR CIRCULATION: The intracranial vertebral arteries are widely patent to the basilar. Patent left PICA, right AICA, and  bilateral SCA origins are visualized. The basilar artery is widely patent. Posterior communicating arteries are diminutive or absent. Both PCAs are patent without evidence of a significant proximal stenosis. No aneurysm. OTHER: No dural venous sinus thrombosis on this non-dedicated study. These results were communicated by telephone to Dr. Pamella on 01/31/2024 at 5:09 PM. IMPRESSION: 1. 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 2. 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 3. Moderate right and mild left intracranial ICA stenoses. 4. Mild cervical carotid atherosclerosis without a significant stenosis. 5. Patent vertebral arteries with limited assessment of the right vertebral origin. Electronically signed by: Dasie Hamburg MD 01/31/2024 05:20 PM EST RP Workstation: [id number]     Microbiology: Results for orders placed or performed in visit on 04/08/19  Novel Coronavirus, NAA ([facility])     Status: None   Collection Time: 04/08/19 12:56 PM   Specimen: Nasopharyngeal(NP) swabs in vial transport medium   NASOPHARYNGE  TESTING  Result Value Ref Range Status   SARS-CoV-2, NAA Not Detected Not Detected Final    Comment: This nucleic acid amplification test was developed and its performance characteristics determined by [employer]. Nucleic acid amplification tests include RT-PCR and TMA. This test has not been FDA cleared or approved. This test has been authorized by FDA under an Emergency Use Authorization (EUA). This test is only authorized for the duration of time the declaration that circumstances exist justifying the authorization of the emergency use of in vitro diagnostic tests for detection of SARS-CoV-2 virus and/or diagnosis of COVID-19 infection under section 564(b)(1) of the Act, 21 U.S.C. 639aaa-6(a) (1), unless the authorization is terminated or revoked sooner. When diagnostic testing is negative, the possibility of a false negative result should be considered in the context of a patient's recent exposures and the presence of clinical signs and symptoms consistent with COVID-19. An individual without symptoms of COVID-19 and who is not shedding SARS-CoV-2 virus wo uld expect to have a negative (not detected) result in this assay.     Labs: CBC: Recent Labs  Lab 01/31/24 1513 02/01/24 0117  WBC 6.7 6.9  NEUTROABS 4.6 4.8  HGB 12.2* 11.6*  HCT 36.6* 35.7*  MCV 85.3 85.0  PLT 224 214   Basic Metabolic Panel: Recent Labs  Lab 01/31/24 1513 02/01/24 0117 02/02/24 0852  NA 136 137 136  K 4.6 4.3 4.5  CL 102 104 104  CO2 24 20* 21*  GLUCOSE 176* 194* 184*  BUN 36* 34* 30*  CREATININE 2.28* 2.25* 2.31*  CALCIUM  9.5 8.5* 9.0  MG  --  2.3  --    Liver Function Tests: Recent Labs  Lab 01/31/24 1513 02/01/24 0117  AST  16 15  ALT 14 13  ALKPHOS 85 67  BILITOT 0.5 0.8  PROT 7.0 6.0*  ALBUMIN 3.9 3.0*   CBG: Recent Labs  Lab 02/01/24 0620 02/01/24 1127 02/01/24 1528 02/01/24 2242 02/02/24 0633  GLUCAP 126* 224* 139* 131* 146*    Discharge time spent: greater than 30 minutes.  Signed: Owen DELENA Lore, MD [facility] 02/02/2024

## 2024-02-02 NOTE — Plan of Care (Signed)
  Problem: Health Behavior/Discharge Planning: Goal: Ability to manage health-related needs will improve 02/02/2024 0646 by Gaetana Randall Mathew GORMAN, RN Outcome: Progressing 02/02/2024 0508 by Gaetana Randall Mathew GORMAN, RN Outcome: Progressing   Problem: Education: Goal: Knowledge of General Education information will improve Description: Including pain rating scale, medication(s)/side effects and non-pharmacologic comfort measures 02/02/2024 0646 by Gaetana Randall Mathew GORMAN, RN Outcome: Progressing 02/02/2024 0508 by Gaetana Randall Mathew GORMAN, RN Outcome: Progressing   Problem: Coping: Goal: Level of anxiety will decrease 02/02/2024 0646 by Gaetana Randall Mathew GORMAN, RN Outcome: Progressing 02/02/2024 0508 by Gaetana Randall Mathew GORMAN, RN Outcome: Progressing   Problem: Ischemic Stroke/TIA Tissue Perfusion: Goal: Complications of ischemic stroke/TIA will be minimized 02/02/2024 0646 by Gaetana Randall Mathew GORMAN, RN Outcome: Progressing 02/02/2024 0508 by Gaetana Randall Mathew GORMAN, RN Outcome: Progressing   Problem: Education: Goal: Ability to describe self-care measures that may prevent or decrease complications (Diabetes Survival Skills Education) will improve 02/02/2024 0646 by Gaetana Randall Mathew GORMAN, RN Outcome: Progressing 02/02/2024 0508 by Gaetana Randall Mathew GORMAN, RN Outcome: Progressing Goal: Individualized Educational Video(s) 02/02/2024 0646 by Gaetana Randall Mathew GORMAN, RN Outcome: Progressing 02/02/2024 0508 by Gaetana Randall Mathew GORMAN, RN Outcome: Progressing

## 2024-02-02 NOTE — TOC Transition Note (Signed)
 Transition of Care [facility]) - Discharge Note   Patient Details  Name: Johnny [name] MRN: [id number] Date of Birth: 1945-07-27  Transition of [facility]) CM/SW Contact:  Corean JAYSON Canary, RN Phone Number: 02/02/2024, 10:57 AM   Clinical Narrative:      Patient is discharging today, will have 24/7 assistance referral made to [facility] neuro for OP PT   Barriers to Discharge: No Barriers Identified   Patient Goals and CMS Choice            Discharge Placement                       Discharge Plan and Services Additional resources added to the After Visit Summary for                  DME Arranged: Vannie DME Agency: [employer] Date DME Agency Contacted: 02/02/24 Time DME Agency Contacted: 1056 Representative spoke with at DME Agency: Darlyn            Social Drivers of Health (SDOH) Interventions SDOH Screenings   Food Insecurity: No Food Insecurity (02/02/2024)  Housing: Low Risk  (02/02/2024)  Transportation Needs: No Transportation Needs (02/02/2024)  Utilities: Not At Risk (02/02/2024)  Alcohol  Screen: Low Risk  (05/16/2023)  Depression (PHQ2-9): Low Risk  (11/23/2023)  Financial Resource Strain: Low Risk  (05/16/2023)  Physical Activity: Sufficiently Active (05/16/2023)  Social Connections: Socially Integrated (02/02/2024)  Stress: No Stress Concern Present (05/16/2023)  Tobacco Use: Medium Risk (01/31/2024)  Health Literacy: Adequate Health Literacy (05/16/2023)     Readmission Risk Interventions     No data to display

## 2024-02-02 NOTE — Discharge Instructions (Addendum)
 Please do not take hydrochlorothiazide  Ibersartan until follow up with PCP, and have renal function check.  Please measure blood sugar before giving glipizide . If blood sugar is below 160, do NOT take glipizide .  Please follow up with PCP early next week for BP follow up and kidney function follow up/  You have new medication for Blood pressure: Norvasc.   Take aspirin  and plavix  for 3 month then plavix  alone. Follow up with your cardiologist in regards if you will need to continue aspirin  beyond 3 months.

## 2024-02-02 NOTE — Plan of Care (Signed)
  Problem: Clinical Measurements: Goal: Ability to maintain clinical measurements within normal limits will improve Outcome: Progressing Goal: Will remain free from infection Outcome: Progressing Goal: Diagnostic test results will improve Outcome: Progressing Goal: Respiratory complications will improve Outcome: Progressing Goal: Cardiovascular complication will be avoided Outcome: Progressing   Problem: Activity: Goal: Risk for activity intolerance will decrease Outcome: Progressing   Problem: Nutrition: Goal: Adequate nutrition will be maintained Outcome: Progressing   Problem: Elimination: Goal: Will not experience complications related to bowel motility Outcome: Progressing Goal: Will not experience complications related to urinary retention Outcome: Progressing   Problem: Pain Managment: Goal: General experience of comfort will improve and/or be controlled Outcome: Progressing   Problem: Safety: Goal: Ability to remain free from injury will improve Outcome: Progressing   Problem: Skin Integrity: Goal: Risk for impaired skin integrity will decrease Outcome: Progressing   Problem: Health Behavior/Discharge Planning: Goal: Ability to manage health-related needs will improve Outcome: Progressing

## 2024-02-05 ENCOUNTER — Telehealth: Payer: Self-pay | Admitting: *Deleted

## 2024-02-05 NOTE — Transitions of Care (Post Inpatient/ED Visit) (Signed)
 02/05/2024  Name: Johnny [name] MRN: [id number] DOB: 09-03-1945  Today's TOC FU Call Status: Today's TOC FU Call Status:: Successful TOC FU Call Completed TOC FU Call Complete Date: 02/05/24  Patient's Name and Date of Birth confirmed. 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  He even declined the walker because he is walking just fine.  We don't need any follow up calls) Any questions or concerns?: No  Items Reviewed: Did you receive and understand the discharge instructions provided?: Yes (thoroughly reviewed with patient who verbalizes good understanding of same) Medications obtained,verified, and reconciled?: Yes (Medications Reviewed) (Full medication reconciliation/ review completed; no concerns or discrepancies identified; confirmed patient obtained/ is taking all newly Rx'd medications as instructed; self-manages medications and denies questions/ concerns around medications today) Any new allergies since your discharge?: No Dietary orders reviewed?: Yes Type of Diet Ordered:: Low salt; heart healthy diet Do you have support at home?: Yes People in Home [RPT]: spouse Name of Support/Comfort Primary Source: Reports independent in self-care activities; resides with supportive spouse; local supportive  daughter- both assists as/ if needed/ indicated  Medications Reviewed Today: Medications Reviewed Today     Reviewed by Johnnae Impastato M, RN (Registered Nurse) on 02/05/24 at 1247  Med List Status: <None>   Medication Order Taking? Sig Documenting Provider Last Dose Status Informant  Accu-Chek Softclix Lancets lancets [id number] Yes Use as instructed to check blood sugars 1 times a day, fasting Shamleffer, Ibtehal Jaralla, MD  Active Spouse/Significant Other  albuterol  (VENTOLIN  HFA) 108 (90 Base) MCG/ACT inhaler [id number] Yes INHALE 2 PUFFS INTO THE LUNGS EVERY 4 HOURS AS NEEDED FOR WHEEZING OR SHORTNESS OF BREATH Plotnikov, Karlynn GAILS, MD  Active Spouse/Significant Other  Alcohol  Swabs (DROPSAFE ALCOHOL  PREP) 70 % PADS [id number] Yes USE AS DIRECTED Shamleffer, Ibtehal Jaralla, MD  Active   allopurinol  (ZYLOPRIM ) 100 MG tablet [id number] Yes TAKE 1 TABLET EVERY DAY Plotnikov, Aleksei V, MD  Active   amLODipine (NORVASC) 5 MG tablet [id number] Yes Take 1 tablet (5 mg total) by mouth daily. Regalado, Belkys A, MD  Active   aspirin  EC 81 MG tablet [id number] Yes Take 1 tablet (81 mg total) by mouth daily. Swallow whole. Regalado, Belkys A, MD  Active   atorvastatin  (LIPITOR) 40 MG tablet [id number] Yes Take 1 tablet (40 mg total) by mouth daily. Regalado, Belkys A, MD  Active   Blood Glucose Monitoring Suppl (ACCU-CHEK GUIDE ME) w/Device KIT [id number] Yes Use as instructed to check blood sugars 1 times a day, fasting Shamleffer, Ibtehal Jaralla, MD  Active Spouse/Significant Other  Blood Glucose Monitoring Suppl (ACCU-CHEK GUIDE ME) w/Device KIT [id number] Yes As directed Plotnikov, Aleksei V, MD  Active   Blood Glucose Monitoring Suppl DEVI [id number] Yes 1 each by Does not apply route in the morning, at noon, and at bedtime. May substitute to any manufacturer covered by patient's  insurance. Plotnikov, Aleksei V, MD  Active   cholecalciferol (VITAMIN D) 1000 UNITS tablet [id number] Yes Take 1,000 Units by  mouth every morning. [provider]  Active Spouse/Significant Other  clopidogrel  (PLAVIX ) 75 MG tablet [id number] Yes Take 1 tablet (75 mg total) by mouth daily. Regalado, Belkys A, MD  Active   empagliflozin  (JARDIANCE ) 25 MG TABS tablet [id number] Yes Take 1 tablet (25 mg total) by mouth daily before breakfast. Shamleffer, Ibtehal Jaralla, MD  Active Spouse/Significant Other  finasteride  (PROSCAR ) 5 MG tablet [id number] Yes TAKE 1 TABLET EVERY MORNING Plotnikov, Aleksei V, MD  Active   fluticasone  furoate-vilanterol (BREO ELLIPTA ) 100-25 MCG/INH AEPB [id number]  Inhale 1 puff into the lungs daily.  Patient not taking: Reported on 02/05/2024   Plotnikov, Karlynn GAILS, MD  Active Spouse/Significant Other  glipiZIDE  (GLUCOTROL ) 10 MG tablet [id number] Yes Take 2 tablets (20 mg total) by mouth daily before breakfast AND 1 tablet (10 mg total) daily before supper. Shamleffer, Ibtehal Jaralla, MD  Active   glucose blood (ACCU-CHEK GUIDE TEST) test strip [id number] Yes 1 each by Other route daily in the afternoon. Use as instructed Shamleffer, Ibtehal Jaralla, MD  Active Spouse/Significant Other  Lancet Devices [facility]) lancets [id number] Yes Use twice daily as instructed. Dx: 250.00 Plotnikov, Aleksei V, MD  Active Spouse/Significant Other  Lancets [facility] DELICA PLUS [place]) MISC [id number] Yes USE TWICE DAILY AS DIRECTED Plotnikov, Aleksei V, MD  Active Spouse/Significant Other  Multiple Vitamins-Minerals (MULTI-VITAMIN GUMMIES PO) [id number] Yes Take 1 tablet by mouth daily. [provider]  Active Spouse/Significant Other  nitroGLYCERIN  (NITROSTAT ) 0.4 MG SL tablet [id number]  Place 1 tablet (0.4 mg total) under the tongue every 5 (five) minutes as needed for chest pain.  Patient not taking: Reported on 02/05/2024   Plotnikov, Aleksei V, MD  Active   pantoprazole  (PROTONIX ) 40 MG tablet [id number] Yes TAKE 1 TABLET EVERY MORNING Plotnikov, Aleksei V, MD  Active   sildenafil   (VIAGRA ) 100 MG tablet [id number]  Take 0.5-1 tablets (50-100 mg total) by mouth daily as needed for erectile dysfunction.  Patient not taking: Reported on 02/05/2024   Plotnikov, Karlynn GAILS, MD  Active Spouse/Significant Other  tamsulosin  (FLOMAX ) 0.4 MG CAPS capsule [id number] Yes TAKE 1 CAPSULE EVERY DAY Plotnikov, Karlynn GAILS, MD  Active            Home Care and Equipment/Supplies: Were Home Health Services Ordered?: No Any new equipment or medical supplies ordered?: No (confirmed walker through [employer] was ordered at time of hospital discharge: patient reports they called to deliver it, but I declined: I am walking fine and do not need it)  Functional Questionnaire: Do you need assistance with bathing/showering or dressing?: No Do you need assistance with meal preparation?: Yes (family assisting) Do you need assistance with eating?: No Do you have difficulty maintaining continence: No Do you need assistance with getting out of bed/getting out of a chair/moving?: No Do you have difficulty managing or taking your medications?: No  Follow up appointments reviewed: PCP Follow-up appointment confirmed?: Yes Date of PCP follow-up appointment?: 02/06/24 Follow-up Provider: PCP- covering provider Dr. Norleen Driscilla Salvage Follow-up appointment confirmed?: No Reason Specialist Follow-Up Not Confirmed: Patient has Specialist Provider Number and will Call for Appointment Do you need transportation to your follow-up appointment?: No Do you understand care options if your condition(s) worsen?: Yes-patient verbalized understanding  SDOH Interventions Today    Flowsheet Row Most Recent Value  SDOH Interventions   Food Insecurity Interventions Intervention Not Indicated  Housing Interventions Intervention Not Indicated  Transportation Interventions Intervention Not Indicated  [Spouse providing transportation post- recent hospital discharge 02/02/24]  Utilities Interventions  Intervention Not Indicated   See TOC assessment tabs for additional assessment/ TOC intervention information Reinforced signs/ symptoms CVA along with corresponding action plan - BFAST education provided Discussed need to go over all medications thoroughly with PCP during hospital follow up office visit 02/06/24 Discussed purpose of ACT in setting of recent CVA  Patient and spouse both decline need for ongoing/ further care management/ coordination outreach; declines enrollment in 30-day TOC program- declines taking my direct phone number should needs/ concerns arise post-TOC call   Pls call/ message for questions,  Bain Whichard Mckinney Christella App, RN, BSN, CCRN Alumnus RN Care Manager  Transitions of Care  [facility] - [facility] [phone]: direct office

## 2024-02-06 ENCOUNTER — Encounter: Payer: Self-pay | Admitting: Internal Medicine

## 2024-02-06 ENCOUNTER — Ambulatory Visit: Admitting: Internal Medicine

## 2024-02-06 ENCOUNTER — Ambulatory Visit: Payer: Self-pay | Admitting: Internal Medicine

## 2024-02-06 ENCOUNTER — Telehealth: Payer: Self-pay

## 2024-02-06 VITALS — BP 130/64 | HR 67 | Temp 98.8°F | Ht 69.0 in | Wt 215.0 lb

## 2024-02-06 DIAGNOSIS — I10 Essential (primary) hypertension: Secondary | ICD-10-CM

## 2024-02-06 DIAGNOSIS — R4701 Aphasia: Secondary | ICD-10-CM

## 2024-02-06 DIAGNOSIS — Z8673 Personal history of transient ischemic attack (TIA), and cerebral infarction without residual deficits: Secondary | ICD-10-CM | POA: Insufficient documentation

## 2024-02-06 DIAGNOSIS — I259 Chronic ischemic heart disease, unspecified: Secondary | ICD-10-CM

## 2024-02-06 DIAGNOSIS — I5022 Chronic systolic (congestive) heart failure: Secondary | ICD-10-CM

## 2024-02-06 DIAGNOSIS — K118 Other diseases of salivary glands: Secondary | ICD-10-CM | POA: Insufficient documentation

## 2024-02-06 DIAGNOSIS — E1122 Type 2 diabetes mellitus with diabetic chronic kidney disease: Secondary | ICD-10-CM

## 2024-02-06 LAB — BASIC METABOLIC PANEL WITH GFR
BUN: 34 mg/dL — ABNORMAL HIGH (ref 6–23)
CO2: 26 meq/L (ref 19–32)
Calcium: 9.7 mg/dL (ref 8.4–10.5)
Chloride: 104 meq/L (ref 96–112)
Creatinine, Ser: 2.13 mg/dL — ABNORMAL HIGH (ref 0.40–1.50)
GFR: 28.93 mL/min — ABNORMAL LOW (ref >60.00)
Glucose, Bld: 175 mg/dL — ABNORMAL HIGH (ref 70–99)
Potassium: 4.3 meq/L (ref 3.5–5.1)
Sodium: 137 meq/L (ref 135–145)

## 2024-02-06 NOTE — Assessment & Plan Note (Signed)
 New onset now slightly improved per wife post d/c, but will need Speech therapy referral

## 2024-02-06 NOTE — Progress Notes (Signed)
 The test results show that your current treatment is OK, as the tests are stable.  Please continue the same plan.  There is no other need for change of treatment or further evaluation based on these results, at this time.  thanks

## 2024-02-06 NOTE — Assessment & Plan Note (Signed)
 For bmp, continue to hold ARB and hct for now

## 2024-02-06 NOTE — Assessment & Plan Note (Signed)
 Volume stable today, cont current med tx, refer Cardiology

## 2024-02-06 NOTE — Assessment & Plan Note (Signed)
 Cont asa, refer new cardiology

## 2024-02-06 NOTE — Telephone Encounter (Signed)
 Copied from CRM #[id number]. Topic: Clinical - Medical Advice >> Feb 06, 2024  1:31 PM Eva FALCON wrote: Reason for CRM: Pt wife is on the line, states they were in for an appointment today, daughter is curious if dad could use Splenda as a sweetner since he is diabetic. Wife Angeline requesting call back from Dr. Alexandra nurse to [phone].

## 2024-02-06 NOTE — Progress Notes (Signed)
 Patient ID: Johnny [name], male   DOB: [birth year], 78 y.o.   MRN: [id number]        Chief Complaint: follow up post hospn nov 26 - 28 2025       HPI:  Johnny [name] is a 78 y.o. male here with wife after hospn with right MCA stroke and expressive aphasia, incidental right parotid mass 5 mm, chf, claudication PAD, dm, htn.  Pt denies chest pain, increased sob or doe, wheezing, orthopnea, PND, increased LE swelling, palpitations, dizziness or syncope.   Pt denies polydipsia, polyuria, or new focal neuro symptoms, and speech somewhat improved, wife asking for referral ST.  Pt has ARB and hct held at d/c, for f/u bmp today.  Cardiac Monitor has been ordered, pt not yet received in the home .  Pt will need Ent referral for right parotid mass.  Pt will need cardiology referral for CHF as last cardiologist has retired.  Also has f/u vascular visit dec 5.         Wt Readings from Last 3 Encounters:  02/06/24 215 lb (97.5 kg)  02/02/24 214 lb 15.2 oz (97.5 kg)  11/23/23 223 lb 3.2 oz (101.2 kg)   BP Readings from Last 3 Encounters:  02/06/24 130/64  02/02/24 (!) 147/65  11/23/23 (!) 104/50         Past Medical History:  Diagnosis Date   Arthritis    At risk for sleep apnea    STOP-BANG=  6   SENT TO PCP 05-21-2013   Benign positional vertigo    Benign prostatic hypertrophy    Blood transfusion without reported diagnosis 2000   CAD (coronary artery disease)    CARDIOLOGIST--  DR DANELLE BIRMINGHAM   CHF (congestive heart failure) (HCC)    Chronic ischemic heart disease, unspecified    ED (erectile dysfunction) of organic origin    GERD (gastroesophageal reflux disease)    History of CHF (congestive heart failure)    systolic   HTN (hypertension)    Hyperlipidemia    Peripheral vascular disease    Tubular adenoma of colon 12/2009   Type 2 diabetes mellitus (HCC)    Wears glasses    Past Surgical History:  Procedure Laterality Date   ABDOMINAL AORTOGRAM W/LOWER EXTREMITY N/A 10/09/2023    Procedure: ABDOMINAL AORTOGRAM W/LOWER EXTREMITY;  Surgeon: Pearline Norman RAMAN, MD;  Location: [facility];  Service: Cardiovascular;  Laterality: N/A;   CARDIAC CATHETERIZATION  03-03-2000  dr danelle taylor   normal lvsf/  two-vessel cad significant complex stenosis at distal left main   CARDIOVASCULAR STRESS TEST  08-09-2010  DR DANELLE TAYLOR   normal lexiscan  nuclear study/  no ischemia/  normal lvf/  ef 54%   CATARACT EXTRACTION W/ INTRAOCULAR LENS IMPLANT Right 03/07/2009   COLONOSCOPY  10/09/2020   12/30/2009   CORONARY ARTERY BYPASS GRAFT  03-06-2000   DR HZMYJMIU   third vessel   LOWER EXTREMITY ANGIOGRAPHY N/A 10/09/2023   Procedure: Lower Extremity Angiography;  Surgeon: Pearline Norman RAMAN, MD;  Location: [facility] IN[facility]ASIVE CV LAB;  Service: Cardiovascular;  Laterality: N/A;   PENILE PROSTHESIS IMPLANT  02/08/2010   COLOPLAST 3-PIECE INFLATABLE   PENILE PROSTHESIS IMPLANT N/A 05/27/2013   Procedure: CYSTO REMOVAL OF PENILE PROSTHESIS;  Surgeon: Mark C Ottelin, MD;  Location: [facility];  Service: Urology;  Laterality: N/A;   POLYPECTOMY  12/30/2009   +TA   TOTAL HIP ARTHROPLASTY  01/24/2011   Procedure: TOTAL HIP ARTHROPLASTY;  Surgeon: Dempsey GAILS [name];  Location: [facility];  Service: Orthopedics;  Laterality: Left;   TOTAL HIP ARTHROPLASTY Right 01/30/2013   Procedure: RIGHT TOTAL HIP ARTHROPLASTY;  Surgeon: Dempsey GAILS Moan, MD;  Location: [facility];  Service: Orthopedics;  Laterality: Right;    reports that he quit smoking about 53 years ago. His smoking use included cigarettes. He started smoking about 75 years ago. He has a 22.2 pack-year smoking history. He has been exposed to tobacco smoke. He has never used smokeless tobacco. He reports that he does not drink alcohol  and does not use drugs. family history includes Cancer (age of onset: 39) in his sister; Cancer - Other in his brother; Coronary artery disease in an other family member; Diabetes in his father; Hypertension in  his mother and another family member. Allergies  Allergen Reactions   Enalapril  Shortness Of Breath   Hydrocodone-Acetaminophen  Nausea Only    Can take codein   Current Outpatient Medications on File Prior to Visit  Medication Sig Dispense Refill   Accu-Chek Softclix Lancets lancets Use as instructed to check blood sugars 1 times a day, fasting 100 each 12   albuterol  (VENTOLIN  HFA) 108 (90 Base) MCG/ACT inhaler INHALE 2 PUFFS INTO THE LUNGS EVERY 4 HOURS AS NEEDED FOR WHEEZING OR SHORTNESS OF BREATH 18 g 5   Alcohol  Swabs (DROPSAFE ALCOHOL  PREP) 70 % PADS USE AS DIRECTED 100 each 3   allopurinol  (ZYLOPRIM ) 100 MG tablet TAKE 1 TABLET EVERY DAY 90 tablet 3   amLODipine (NORVASC) 5 MG tablet Take 1 tablet (5 mg total) by mouth daily. 30 tablet 11   aspirin  EC 81 MG tablet Take 1 tablet (81 mg total) by mouth daily. Swallow whole. 30 tablet 2   atorvastatin  (LIPITOR) 40 MG tablet Take 1 tablet (40 mg total) by mouth daily. 30 tablet 0   Blood Glucose Monitoring Suppl (ACCU-CHEK GUIDE ME) w/Device KIT Use as instructed to check blood sugars 1 times a day, fasting 1 kit 0   Blood Glucose Monitoring Suppl (ACCU-CHEK GUIDE ME) w/Device KIT As directed 1 kit 1   Blood Glucose Monitoring Suppl DEVI 1 each by Does not apply route in the morning, at noon, and at bedtime. May substitute to any manufacturer covered by patient's insurance. 1 each 0   cholecalciferol (VITAMIN D) 1000 UNITS tablet Take 1,000 Units by mouth every morning.     clopidogrel  (PLAVIX ) 75 MG tablet Take 1 tablet (75 mg total) by mouth daily. 30 tablet 3   empagliflozin  (JARDIANCE ) 25 MG TABS tablet Take 1 tablet (25 mg total) by mouth daily before breakfast. 90 tablet 3   finasteride  (PROSCAR ) 5 MG tablet TAKE 1 TABLET EVERY MORNING 90 tablet 3   fluticasone  furoate-vilanterol (BREO ELLIPTA ) 100-25 MCG/INH AEPB Inhale 1 puff into the lungs daily. (Patient not taking: Reported on 02/05/2024) 1 each 5   glipiZIDE  (GLUCOTROL ) 10 MG  tablet Take 2 tablets (20 mg total) by mouth daily before breakfast AND 1 tablet (10 mg total) daily before supper. 270 tablet 3   glucose blood (ACCU-CHEK GUIDE TEST) test strip 1 each by Other route daily in the afternoon. Use as instructed 100 each 12   Lancet Devices (ACCU-CHEK SOFTCLIX) lancets Use twice daily as instructed. Dx: 250.00 1 each 3   Lancets (ONETOUCH DELICA PLUS LANCET33G) MISC USE TWICE DAILY AS DIRECTED 200 each 3   Multiple Vitamins-Minerals (MULTI-VITAMIN GUMMIES PO) Take 1 tablet by mouth daily.     nitroGLYCERIN  (NITROSTAT ) 0.4 MG SL  tablet Place 1 tablet (0.4 mg total) under the tongue every 5 (five) minutes as needed for chest pain. (Patient not taking: Reported on 02/05/2024) 25 tablet 3   pantoprazole  (PROTONIX ) 40 MG tablet TAKE 1 TABLET EVERY MORNING 90 tablet 3   sildenafil  (VIAGRA ) 100 MG tablet Take 0.5-1 tablets (50-100 mg total) by mouth daily as needed for erectile dysfunction. (Patient not taking: Reported on 02/05/2024) 12 tablet 11   tamsulosin  (FLOMAX ) 0.4 MG CAPS capsule TAKE 1 CAPSULE EVERY DAY 90 capsule 3   No current facility-administered medications on file prior to visit.        ROS:  All others reviewed and negative.  Objective        PE:  BP 130/64 (BP Location: Right Arm, Patient Position: Sitting, Cuff Size: Normal)   Pulse 67   Temp 98.8 F (37.1 C) (Oral)   Ht 5' 9 (1.753 m)   Wt 215 lb (97.5 kg)   SpO2 99%   BMI 31.75 kg/m                 Constitutional: Pt appears in NAD               HENT: Head: NCAT.                Right Ear: External ear normal.                 Left Ear: External ear normal.                Eyes: . Pupils are equal, round, and reactive to light. Conjunctivae and EOM are normal               Nose: without d/c or deformity               Neck: Neck supple. Gross normal ROM               Cardiovascular: Normal rate and regular rhythm.                 Pulmonary/Chest: Effort normal and breath sounds without rales  or wheezing.                Abd:  Soft, NT, ND, + BS, no organomegaly               Neurological: Pt is alert. At baseline orientation, motor grossly intact except trace left facial droop and persistent at least mild expressive aphasia               Skin: Skin is warm. No rashes, no other new lesions, LE edema - none               Psychiatric: Pt behavior is normal without agitation   Micro: none  Cardiac tracings I have personally interpreted today:  none  Pertinent Radiological findings (summarize): none   Lab Results  Component Value Date   WBC 6.9 02/01/2024   HGB 11.6 (L) 02/01/2024   HCT 35.7 (L) 02/01/2024   PLT 214 02/01/2024   GLUCOSE 175 (H) 02/06/2024   CHOL 158 02/01/2024   TRIG 151 (H) 02/01/2024   HDL 30 (L) 02/01/2024   LDLDIRECT 93.0 06/07/2022   LDLCALC 98 02/01/2024   ALT 13 02/01/2024   AST 15 02/01/2024   NA 137 02/06/2024   K 4.3 02/06/2024   CL 104 02/06/2024   CREATININE 2.13 (H) 02/06/2024   BUN 34 (H) 02/06/2024   CO2 26  02/06/2024   TSH 8.88 (H) 07/26/2023   PSA 1.77 07/26/2023   INR 1.2 01/31/2024   HGBA1C 8.3 (H) 02/01/2024   Assessment/Plan:  JOHNJOSEPH ROLFE is a 78 y.o. Black or African American [2] male with  has a past medical history of Arthritis, At risk for sleep apnea, Benign positional vertigo, Benign prostatic hypertrophy, Blood transfusion without reported diagnosis (2000), CAD (coronary artery disease), CHF (congestive heart failure) (HCC), Chronic ischemic heart disease, unspecified, ED (erectile dysfunction) of organic origin, GERD (gastroesophageal reflux disease), History of CHF (congestive heart failure), HTN (hypertension), Hyperlipidemia, Peripheral vascular disease, Tubular adenoma of colon (12/2009), Type 2 diabetes mellitus (HCC), and Wears glasses.  Expressive aphasia New onset now slightly improved per wife post d/c, but will need Speech therapy referral  Diabetes mellitus (HCC) Lab Results  Component Value Date    HGBA1C 8.3 (H) 02/01/2024   uncontrolled pt to continue current medical treatment jardiance  25 every day, glucotrol  20 every day, and close f/u with pcp for f/u A1c, declines further changes today   Chronic systolic CHF (congestive heart failure) (HCC) Volume stable today, cont current med tx, refer Cardiology  History of right MCA stroke Stable to improved post dc, pt to continue plavix  75 every day and lipitor 40 mg, also continue asa 81 mg every day as intended per cardiology, and pt to place cardiac monitor soon  ISCHEMIC HEART DISEASE Cont asa, refer new cardiology  Mass of right parotid gland Incidental, can't r/o malignancy, for ENT referral  Essential hypertension For bmp, continue to hold ARB and hct for now  Followup: Return in about 3 months (around 05/06/2024) for follow up with PCP.  Lynwood Rush, MD 02/06/2024 9:45 PM [facility]

## 2024-02-06 NOTE — Assessment & Plan Note (Addendum)
 Lab Results  Component Value Date   HGBA1C 8.3 (H) 02/01/2024   uncontrolled pt to continue current medical treatment jardiance  25 every day, glucotrol  20 every day, and close f/u with pcp for f/u A1c, declines further changes today

## 2024-02-06 NOTE — Patient Instructions (Signed)
 Please continue all other medications as before, including the aspirin  and the blood thinner  We can hold on the ARB blood pressure medicine, and the mild fluid pill for now  Please have the pharmacy call with any other refills you may need.  Please continue your efforts at being more active, low cholesterol diet, and weight control.  Please keep your appointments with your specialists as you may have planned  Hopefully you will hear about the monitor they are sending  You will be contacted regarding the referral for: ENT for the right parotid gland mass, Cardiology, and Speech Therapy  Please go to the LAB at the blood drawing area for the tests to be done  You will be contacted by phone if any changes need to be made immediately.  Otherwise, you will receive a letter about your results with an explanation, but please check with MyChart first.  Please make an Appointment to return in 3 months, or sooner if needed, with Dr Garald

## 2024-02-06 NOTE — Assessment & Plan Note (Addendum)
 Stable to improved post dc, pt to continue plavix  75 every day and lipitor 40 mg, also continue asa 81 mg every day as intended per cardiology, and pt to place cardiac monitor soon

## 2024-02-06 NOTE — Assessment & Plan Note (Signed)
 Incidental, can't r/o malignancy, for ENT referral

## 2024-02-06 NOTE — Telephone Encounter (Signed)
 Yes, he can use Splenda.  Thank you

## 2024-02-07 NOTE — Telephone Encounter (Signed)
 Spoke with the pts wife and she has been informed he can use the splenda as a sweetener.

## 2024-02-07 NOTE — Progress Notes (Deleted)
 Patient ID: Johnny [name], male   DOB: [birth year], 78 y.o.   MRN: [id number]  Reason for Consult: No chief complaint on file.   Referred by Plotnikov, Karlynn GAILS, MD  Subjective:     HPI Johnny [name] is a 78 y.o. male with PAD presenting for follow-up.  He is a claudicant and underwent bilateral lower extremity CO2 angiogram in August 2025.  This was diagnostic visit demonstrated a flush occlusion of the SFA on the right and a severe stenosis of the SFA on the left with severe tibial disease bilaterally.  At that time we discussed since he does not have rest pain or nonhealing wounds we will continue with medical management for now. Today he reports ***  Past Medical History:  Diagnosis Date   Arthritis    At risk for sleep apnea    STOP-BANG=  6   SENT TO PCP 05-21-2013   Benign positional vertigo    Benign prostatic hypertrophy    Blood transfusion without reported diagnosis 2000   CAD (coronary artery disease)    CARDIOLOGIST--  DR DANELLE BIRMINGHAM   CHF (congestive heart failure) (HCC)    Chronic ischemic heart disease, unspecified    ED (erectile dysfunction) of organic origin    GERD (gastroesophageal reflux disease)    History of CHF (congestive heart failure)    systolic   HTN (hypertension)    Hyperlipidemia    Peripheral vascular disease    Tubular adenoma of colon 12/2009   Type 2 diabetes mellitus (HCC)    Wears glasses    Family History  Problem Relation Age of Onset   Hypertension Mother    Diabetes Father    Cancer Sister 29       ovarian ca   Cancer - Other Brother        cancer all over   Coronary artery disease Other        1st degree male relative   Hypertension Other    Colon cancer Neg Hx    Stomach cancer Neg Hx    Esophageal cancer Neg Hx    Pancreatic cancer Neg Hx    Liver disease Neg Hx    Colon polyps Neg Hx    Rectal cancer Neg Hx    Past Surgical History:  Procedure Laterality Date   ABDOMINAL AORTOGRAM W/LOWER EXTREMITY N/A  10/09/2023   Procedure: ABDOMINAL AORTOGRAM W/LOWER EXTREMITY;  Surgeon: Pearline Norman RAMAN, MD;  Location: [facility];  Service: Cardiovascular;  Laterality: N/A;   CARDIAC CATHETERIZATION  03-03-2000  dr danelle taylor   normal lvsf/  two-vessel cad significant complex stenosis at distal left main   CARDIOVASCULAR STRESS TEST  08-09-2010  DR DANELLE TAYLOR   normal lexiscan  nuclear study/  no ischemia/  normal lvf/  ef 54%   CATARACT EXTRACTION W/ INTRAOCULAR LENS IMPLANT Right 03/07/2009   COLONOSCOPY  10/09/2020   12/30/2009   CORONARY ARTERY BYPASS GRAFT  03-06-2000   DR HZMYJMIU   third vessel   LOWER EXTREMITY ANGIOGRAPHY N/A 10/09/2023   Procedure: Lower Extremity Angiography;  Surgeon: Pearline Norman RAMAN, MD;  Location: [facility] INVASIVE CV LAB;  Service: Cardiovascular;  Laterality: N/A;   PENILE PROSTHESIS IMPLANT  02/08/2010   COLOPLAST 3-PIECE INFLATABLE   PENILE PROSTHESIS IMPLANT N/A 05/27/2013   Procedure: CYSTO REMOVAL OF PENILE PROSTHESIS;  Surgeon: Mark C Ottelin, MD;  Location: [facility];  Service: Urology;  Laterality: N/A;   POLYPECTOMY  12/30/2009   +  TA   TOTAL HIP ARTHROPLASTY  01/24/2011   Procedure: TOTAL HIP ARTHROPLASTY;  Surgeon: Dempsey GAILS [name];  Location: [facility];  Service: Orthopedics;  Laterality: Left;   TOTAL HIP ARTHROPLASTY Right 01/30/2013   Procedure: RIGHT TOTAL HIP ARTHROPLASTY;  Surgeon: Dempsey GAILS Moan, MD;  Location: [facility];  Service: Orthopedics;  Laterality: Right;    Short Social History:  Social History   Tobacco Use   Smoking status: Former    Current packs/day: 0.00    Average packs/day: 1 pack/day for 22.2 years (22.2 ttl pk-yrs)    Types: Cigarettes    Start date: 03/07/1948    Quit date: 05/22/1970    Years since quitting: 53.7    Passive exposure: Past   Smokeless tobacco: Never  Substance Use Topics   Alcohol  use: No    Comment: seldom    Allergies  Allergen Reactions   Enalapril  Shortness Of Breath    Hydrocodone-Acetaminophen  Nausea Only    Can take codein    Current Outpatient Medications  Medication Sig Dispense Refill   Accu-Chek Softclix Lancets lancets Use as instructed to check blood sugars 1 times a day, fasting 100 each 12   albuterol  (VENTOLIN  HFA) 108 (90 Base) MCG/ACT inhaler INHALE 2 PUFFS INTO THE LUNGS EVERY 4 HOURS AS NEEDED FOR WHEEZING OR SHORTNESS OF BREATH 18 g 5   Alcohol  Swabs (DROPSAFE ALCOHOL  PREP) 70 % PADS USE AS DIRECTED 100 each 3   allopurinol  (ZYLOPRIM ) 100 MG tablet TAKE 1 TABLET EVERY DAY 90 tablet 3   amLODipine (NORVASC) 5 MG tablet Take 1 tablet (5 mg total) by mouth daily. 30 tablet 11   aspirin  EC 81 MG tablet Take 1 tablet (81 mg total) by mouth daily. Swallow whole. 30 tablet 2   atorvastatin  (LIPITOR) 40 MG tablet Take 1 tablet (40 mg total) by mouth daily. 30 tablet 0   Blood Glucose Monitoring Suppl (ACCU-CHEK GUIDE ME) w/Device KIT Use as instructed to check blood sugars 1 times a day, fasting 1 kit 0   Blood Glucose Monitoring Suppl (ACCU-CHEK GUIDE ME) w/Device KIT As directed 1 kit 1   Blood Glucose Monitoring Suppl DEVI 1 each by Does not apply route in the morning, at noon, and at bedtime. May substitute to any manufacturer covered by patient's insurance. 1 each 0   cholecalciferol (VITAMIN D) 1000 UNITS tablet Take 1,000 Units by mouth every morning.     clopidogrel  (PLAVIX ) 75 MG tablet Take 1 tablet (75 mg total) by mouth daily. 30 tablet 3   empagliflozin  (JARDIANCE ) 25 MG TABS tablet Take 1 tablet (25 mg total) by mouth daily before breakfast. 90 tablet 3   finasteride  (PROSCAR ) 5 MG tablet TAKE 1 TABLET EVERY MORNING 90 tablet 3   fluticasone  furoate-vilanterol (BREO ELLIPTA ) 100-25 MCG/INH AEPB Inhale 1 puff into the lungs daily. (Patient not taking: Reported on 02/05/2024) 1 each 5   glipiZIDE  (GLUCOTROL ) 10 MG tablet Take 2 tablets (20 mg total) by mouth daily before breakfast AND 1 tablet (10 mg total) daily before supper. 270 tablet 3    glucose blood (ACCU-CHEK GUIDE TEST) test strip 1 each by Other route daily in the afternoon. Use as instructed 100 each 12   Lancet Devices (ACCU-CHEK SOFTCLIX) lancets Use twice daily as instructed. Dx: 250.00 1 each 3   Lancets (ONETOUCH DELICA PLUS LANCET33G) MISC USE TWICE DAILY AS DIRECTED 200 each 3   Multiple Vitamins-Minerals (MULTI-VITAMIN GUMMIES PO) Take 1 tablet by mouth daily.  nitroGLYCERIN  (NITROSTAT ) 0.4 MG SL tablet Place 1 tablet (0.4 mg total) under the tongue every 5 (five) minutes as needed for chest pain. (Patient not taking: Reported on 02/05/2024) 25 tablet 3   pantoprazole  (PROTONIX ) 40 MG tablet TAKE 1 TABLET EVERY MORNING 90 tablet 3   sildenafil  (VIAGRA ) 100 MG tablet Take 0.5-1 tablets (50-100 mg total) by mouth daily as needed for erectile dysfunction. (Patient not taking: Reported on 02/05/2024) 12 tablet 11   tamsulosin  (FLOMAX ) 0.4 MG CAPS capsule TAKE 1 CAPSULE EVERY DAY 90 capsule 3   No current facility-administered medications for this visit.    REVIEW OF SYSTEMS  All other systems were reviewed and are negative     Objective:  Objective   There were no vitals filed for this visit. There is no height or weight on file to calculate BMI.  Physical Exam General: no acute distress Cardiac: hemodynamically stable Abdomen: non-tender, no pulsatile mass*** Extremities: no edema, cyanosis or wounds*** Vascular:   Right: ***  Left: ***  Data: ABI ***  Previously: Right 0.66 toe pressure 42, left 0.72 toe pressure 57     Assessment/Plan:   CALLAWAY HAILES is a 78 y.o. male with PAD and claudication.  He underwent diagnostic angiogram in August 2025 which demonstrated a flush occlusion of the SFA on the right and a severe stenosis of the SFA on the left with severe tibial disease bilaterally. 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  Given that he does not have any symptoms of CLI we will continue with best medical management and surveillance.  If he does develop rest pain or tissue loss he would require a bypass on the right whereas the left would likely require a multilevel endovascular intervention.  Continue with aspirin  and statin. Encouraged to continue as much walking exercise as possible. Plan for follow-up with a repeat ABI in 6 months, instructed to call earlier should his symptoms worsen or if he develops any pain.  Norman GORMAN Serve MD [facility]

## 2024-02-08 NOTE — Progress Notes (Unsigned)
 Patient ID: Johnny [name], male   DOB: 10-29-1945, 78 y.o.   MRN: [id number]  Reason for Consult: Follow-up   Referred by Plotnikov, Karlynn GAILS, MD  Subjective:     HPI Johnny [name] is a 78 y.o. male with PAD presenting for follow-up.  He is a claudicant and underwent bilateral lower extremity CO2 angiogram in August 2025.  This was diagnostic visit demonstrated a flush occlusion of the SFA on the right and a severe stenosis of the SFA on the left with severe tibial disease bilaterally.  At that time we discussed since he does not have rest pain or nonhealing wounds we will continue with medical management for now. Today he reports his legs have been doing well.  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  Unfortunately he did recently have a small stroke that affected his speech around Thanksgiving.  He was not noted to have any significant cervical carotid disease.    Past Medical History:  Diagnosis Date   Arthritis    At risk for sleep apnea    STOP-BANG=  6   SENT TO PCP 05-21-2013   Benign positional vertigo    Benign prostatic hypertrophy    Blood transfusion without reported diagnosis 2000   CAD (coronary artery disease)    CARDIOLOGIST--  DR DANELLE BIRMINGHAM   CHF (congestive heart failure) (HCC)    Chronic ischemic heart disease, unspecified    ED (erectile dysfunction) of organic origin    GERD (gastroesophageal reflux disease)    History of CHF (congestive heart failure)    systolic   HTN (hypertension)    Hyperlipidemia    Peripheral vascular disease    Tubular adenoma of colon 12/2009   Type 2 diabetes mellitus (HCC)    Wears glasses    Family History  Problem Relation Age of Onset   Hypertension Mother    Diabetes Father    Cancer Sister 33       ovarian ca   Cancer - Other Brother        cancer all over   Coronary artery disease Other        1st degree male relative   Hypertension  Other    Colon cancer Neg Hx    Stomach cancer Neg Hx    Esophageal cancer Neg Hx    Pancreatic cancer Neg Hx    Liver disease Neg Hx    Colon polyps Neg Hx    Rectal cancer Neg Hx    Past Surgical History:  Procedure Laterality Date   ABDOMINAL AORTOGRAM W/LOWER EXTREMITY N/A 10/09/2023   Procedure: ABDOMINAL AORTOGRAM W/LOWER EXTREMITY;  Surgeon: Pearline Norman RAMAN, MD;  Location: [facility];  Service: Cardiovascular;  Laterality: N/A;   CARDIAC CATHETERIZATION  03-03-2000  dr danelle taylor   normal lvsf/  two-vessel cad significant complex stenosis at distal left main   CARDIOVASCULAR STRESS TEST  08-09-2010  DR DANELLE TAYLOR   normal lexiscan  nuclear study/  no ischemia/  normal lvf/  ef 54%   CATARACT EXTRACTION W/ INTRAOCULAR LENS IMPLANT Right 03/07/2009   COLONOSCOPY  10/09/2020   12/30/2009   CORONARY ARTERY BYPASS GRAFT  03-06-2000   DR HZMYJMIU   third vessel   LOWER EXTREMITY ANGIOGRAPHY N/A 10/09/2023   Procedure: Lower Extremity Angiography;  Surgeon: Pearline Norman RAMAN, MD;  Location: [facility] INVAS[facility] CV LAB;  Service: Cardiovascular;  Laterality: N/A;   PENILE PROSTHESIS IMPLANT  02/08/2010   COLOPLAST 3-PIECE INFLATABLE   PENILE PROSTHESIS IMPLANT N/A 05/27/2013   Procedure: CYSTO REMOVAL OF PENILE PROSTHESIS;  Surgeon: Mark C Ottelin, MD;  Location: [facility];  Service: Urology;  Laterality: N/A;   POLYPECTOMY  12/30/2009   +TA   TOTAL HIP ARTHROPLASTY  01/24/2011   Procedure: TOTAL HIP ARTHROPLASTY;  Surgeon: Dempsey GAILS [name];  Location: [facility];  Service: Orthopedics;  Laterality: Left;   TOTAL HIP ARTHROPLASTY Right 01/30/2013   Procedure: RIGHT TOTAL HIP ARTHROPLASTY;  Surgeon: Dempsey GAILS Moan, MD;  Location: [facility];  Service: Orthopedics;  Laterality: Right;    Short Social History:  Social History   Tobacco Use   Smoking status: Former    Current packs/day: 0.00    Average packs/day: 1 pack/day for 22.2 years (22.2 ttl pk-yrs)    Types:  Cigarettes    Start date: 03/07/1948    Quit date: 05/22/1970    Years since quitting: 53.7    Passive exposure: Past   Smokeless tobacco: Never  Substance Use Topics   Alcohol  use: No    Comment: seldom    Allergies  Allergen Reactions   Enalapril  Shortness Of Breath   Hydrocodone-Acetaminophen  Nausea Only    Can take codein    Current Outpatient Medications  Medication Sig Dispense Refill   albuterol  (VENTOLIN  HFA) 108 (90 Base) MCG/ACT inhaler INHALE 2 PUFFS INTO THE LUNGS EVERY 4 HOURS AS NEEDED FOR WHEEZING OR SHORTNESS OF BREATH 18 g 5   allopurinol  (ZYLOPRIM ) 100 MG tablet TAKE 1 TABLET EVERY DAY 90 tablet 3   amLODipine  (NORVASC ) 5 MG tablet Take 1 tablet (5 mg total) by mouth daily. 30 tablet 11   aspirin  EC 81 MG tablet Take 1 tablet (81 mg total) by mouth daily. Swallow whole. 30 tablet 2   atorvastatin  (LIPITOR) 40 MG tablet Take 1 tablet (40 mg total) by mouth daily. 30 tablet 0   cholecalciferol (VITAMIN D) 1000 UNITS tablet Take 1,000 Units by mouth every morning.     clopidogrel  (PLAVIX ) 75 MG tablet Take 1 tablet (75 mg total) by mouth daily. 30 tablet 3   empagliflozin  (JARDIANCE ) 25 MG TABS tablet Take 1 tablet (25 mg total) by mouth daily before breakfast. 90 tablet 3   finasteride  (PROSCAR ) 5 MG tablet TAKE 1 TABLET EVERY MORNING 90 tablet 3   glipiZIDE  (GLUCOTROL ) 10 MG tablet Take 2 tablets (20 mg total) by mouth daily before breakfast AND 1 tablet (10 mg total) daily before supper. 270 tablet 3   Multiple Vitamins-Minerals (MULTI-VITAMIN GUMMIES PO) Take 1 tablet by mouth daily.     pantoprazole  (PROTONIX ) 40 MG tablet TAKE 1 TABLET EVERY MORNING 90 tablet 3   tamsulosin  (FLOMAX ) 0.4 MG CAPS capsule TAKE 1 CAPSULE EVERY DAY 90 capsule 3   No current facility-administered medications for this visit.    REVIEW OF SYSTEMS  All other systems were reviewed and are negative     Objective:  Objective   Vitals:   02/09/24 1518  BP: (!) 155/69  Pulse: (!) 56   Resp: 20  Temp: 98.3 F (36.8 C)  TempSrc: Temporal  SpO2: 100%  Weight: 216 lb (98 kg)  Height: 5' 9 (1.753 m)   Body mass index is 31.9 kg/m.  Physical Exam General: no acute distress Cardiac: hemodynamically stable Extremities: no edema, cyanosis or wounds  Data: ABI +---------+------------------+-----+----------+--------+  Right   Rt Pressure (mmHg)IndexWaveform  Comment   +---------+------------------+-----+----------+--------+  Brachial 186                                        +---------+------------------+-----+----------+--------+  PTA     132               0.71 monophasic          +---------+------------------+-----+----------+--------+  DP      116               0.62 monophasic          +---------+------------------+-----+----------+--------+  Great Toe81                0.44 Abnormal            +---------+------------------+-----+----------+--------+   +---------+------------------+-----+----------+---------+  Left    Lt Pressure (mmHg)IndexWaveform  Comment    +---------+------------------+-----+----------+---------+  Brachial 182                                         +---------+------------------+-----+----------+---------+  PTA     0                 0.00 absent    inaudible  +---------+------------------+-----+----------+---------+  DP      161               0.87 monophasic           +---------+------------------+-----+----------+---------+  Great Toe48                0.26 Abnormal             +---------+------------------+-----+----------+---------+   Previously: Right 0.66 toe pressure 42, left 0.72 toe pressure 57  Reviewed CTA head and neck No significant flow-limiting stenosis of bilateral cervical carotid arteries  Reviewed neurology note from his stroke admission     Assessment/Plan:   Johnny [name] is a 78 y.o. male with PAD and claudication.  He underwent diagnostic angiogram  in August 2025 which demonstrated a flush occlusion of the SFA on the right and a severe stenosis of the SFA on the left with severe tibial disease bilaterally. 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  Given that he does not have any symptoms of CLI we will continue with best medical management and surveillance.  If he does develop rest pain or tissue loss he would require a bypass on the right whereas the left would likely require a multilevel endovascular intervention.  He will be scheduled with our hyperlipidemia pharmacy clinic for medication optimization Encouraged to continue as much walking exercise as possible. Plan for follow-up with a repeat ABI in 6 months, instructed to call earlier should his symptoms worsen or if he develops any pain.  Norman GORMAN Serve MD [facility]

## 2024-02-09 ENCOUNTER — Ambulatory Visit: Attending: Vascular Surgery | Admitting: Vascular Surgery

## 2024-02-09 ENCOUNTER — Ambulatory Visit: Admitting: Vascular Surgery

## 2024-02-09 ENCOUNTER — Ambulatory Visit (HOSPITAL_COMMUNITY): Admission: RE | Admit: 2024-02-09 | Source: Ambulatory Visit

## 2024-02-09 ENCOUNTER — Ambulatory Visit (HOSPITAL_COMMUNITY)
Admission: RE | Admit: 2024-02-09 | Discharge: 2024-02-09 | Disposition: A | Source: Ambulatory Visit | Attending: Vascular Surgery

## 2024-02-09 ENCOUNTER — Encounter: Payer: Self-pay | Admitting: Vascular Surgery

## 2024-02-09 VITALS — BP 155/69 | HR 56 | Temp 98.3°F | Resp 20 | Ht 69.0 in | Wt 216.0 lb

## 2024-02-09 DIAGNOSIS — I739 Peripheral vascular disease, unspecified: Secondary | ICD-10-CM

## 2024-02-09 LAB — VAS US ABI WITH/WO TBI
Left ABI: 0.87
Right ABI: 0.71

## 2024-02-12 ENCOUNTER — Other Ambulatory Visit: Payer: Self-pay

## 2024-02-12 DIAGNOSIS — I739 Peripheral vascular disease, unspecified: Secondary | ICD-10-CM

## 2024-02-16 ENCOUNTER — Ambulatory Visit (INDEPENDENT_AMBULATORY_CARE_PROVIDER_SITE_OTHER): Admitting: Otolaryngology

## 2024-02-16 ENCOUNTER — Encounter (INDEPENDENT_AMBULATORY_CARE_PROVIDER_SITE_OTHER): Payer: Self-pay | Admitting: Otolaryngology

## 2024-02-16 VITALS — BP 141/87 | HR 87 | Ht 69.0 in | Wt 221.0 lb

## 2024-02-16 DIAGNOSIS — K118 Other diseases of salivary glands: Secondary | ICD-10-CM

## 2024-02-16 NOTE — Progress Notes (Signed)
 Reason for Consult: Parotid mass Referring Physician: Dr. Norleen Sudie JONELLE Johnny [name] is an 78 y.o. male.  HPI: History of a stroke and was having workup for that with an MRI scan.  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  He has had no symptoms.  Did not know it was there.  Has no dysphagia or odynophagia.  No hoarseness.  No skin lesions that have been removed in the scalp or facial area.  He has no salivation issues.  Past Medical History:  Diagnosis Date   Arthritis    At risk for sleep apnea    STOP-BANG=  6   SENT TO PCP 05-21-2013   Benign positional vertigo    Benign prostatic hypertrophy    Blood transfusion without reported diagnosis 2000   CAD (coronary artery disease)    CARDIOLOGIST--  DR DANELLE BIRMINGHAM   CHF (congestive heart failure) (HCC)    Chronic ischemic heart disease, unspecified    ED (erectile dysfunction) of organic origin    GERD (gastroesophageal reflux disease)    History of CHF (congestive heart failure)    systolic   HTN (hypertension)    Hyperlipidemia    Peripheral vascular disease    Tubular adenoma of colon 12/2009   Type 2 diabetes mellitus (HCC)    Wears glasses     Past Surgical History:  Procedure Laterality Date   ABDOMINAL AORTOGRAM W/LOWER EXTREMITY N/A 10/09/2023   Procedure: ABDOMINAL AORTOGRAM W/LOWER EXTREMITY;  Surgeon: Pearline Norman RAMAN, MD;  Location: [facility];  Service: Cardiovascular;  Laterality: N/A;   CARDIAC CATHETERIZATION  03-03-2000  dr danelle taylor   normal lvsf/  two-vessel cad significant complex stenosis at distal left main   CARDIOVASCULAR STRESS TEST  08-09-2010  DR DANELLE TAYLOR   normal lexiscan  nuclear study/  no ischemia/  normal lvf/  ef 54%   CATARACT EXTRACTION W/ INTRAOCULAR LENS IMPLANT Right 03/07/2009   COLONOSCOPY  10/09/2020   12/30/2009   CORONARY ARTERY BYPASS GRAFT  03-06-2000   DR HZMYJMIU   third vessel   LOWER EXTREMITY ANGIOGRAPHY N/A 10/09/2023   Procedure: Lower  Extremity Angiography;  Surgeon: Pearline Norman RAMAN, MD;  Location: [facility] INVASIVE CV LAB;  Service: Cardiovascular;  Laterality: N/A;   PENILE PROSTHESIS IMPLANT  02/08/2010   COLOPLAST 3-PIECE INFLATABLE   PENILE PROSTHESIS IMPLANT N/A 05/27/2013   Procedure: CYSTO REMOVAL OF PENILE PROSTHESIS;  Surgeon: Mark C Ottelin, MD;  Location: [facility];  Service: Urology;  Laterality: N/A;   POLYPECTOMY  12/30/2009   +TA   TOTAL HIP ARTHROPLASTY  01/24/2011   Procedure: TOTAL HIP ARTHROPLASTY;  Surgeon: Dempsey GAILS [name];  Location: [facility];  Service: Orthopedics;  Laterality: Left;   TOTAL HIP ARTHROPLASTY Right 01/30/2013   Procedure: RIGHT TOTAL HIP ARTHROPLASTY;  Surgeon: Dempsey GAILS Moan, MD;  Location: [facility];  Service: Orthopedics;  Laterality: Right;    Family History  Problem Relation Age of Onset   Hypertension Mother    Diabetes Father    Cancer Sister 74       ovarian ca   Cancer - Other Brother        cancer all over   Coronary artery disease Other        1st degree male relative   Hypertension Other    Colon cancer Neg Hx    Stomach cancer Neg Hx    Esophageal cancer Neg Hx    Pancreatic  cancer Neg Hx    Liver disease Neg Hx    Colon polyps Neg Hx    Rectal cancer Neg Hx     Social History:  reports that he quit smoking about 53 years ago. His smoking use included cigarettes. He started smoking about 75 years ago. He has a 22.2 pack-year smoking history. He has been exposed to tobacco smoke. He has never used smokeless tobacco. He reports that he does not drink alcohol  and does not use drugs.  Allergies: Allergies[1]   No results found for this or any previous visit (from the past 48 hours).  No results found.  ROS Blood pressure (!) 141/87, pulse 87, height 5' 9 (1.753 m), weight 221 lb (100.2 kg), SpO2 95%. Physical Exam Constitutional:      Appearance: Normal appearance.  HENT:     Head: Normocephalic and atraumatic.     Right Ear: Tympanic  membrane is without lesions and middle ear aerated, ear canal and external ear normal.     Left Ear: Tympanic membrane is without lesions and middle ear aerated, ear canal and external ear normal.     Nose: Nose without deviation of septum.  Turbinates with mild hypertrophy, No significant swelling or masses.     Oral cavity/oropharynx: Mucous membranes are moist. No lesions or masses    Larynx: normal voice. Mirror attempted without success    Eyes:     Extraocular Movements: Extraocular movements intact.     Conjunctiva/sclera: Conjunctivae normal.     Pupils: Pupils are equal, round, and reactive to light.  Cardiovascular:     Rate and Rhythm: Normal rate.  Pulmonary:     Effort: Pulmonary effort is normal.  Musculoskeletal:     Cervical back: Normal range of motion and neck supple. No rigidity.  Lymphadenopathy:     Cervical: No cervical adenopathy or masses.salivary glands without lesions. .     Salivary glands-there is some fullness in the right tail of the parotid but I do not feel specifically a mass no mass or swelling Neurological:     Mental Status: He is alert. CN 2-12 intact. 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  Right now that would be difficult to biopsy with a needle.  He has no symptoms.  I think a recheck in 6 months would be appropriate and he very much agrees.  He does not want to have any intervention currently.  He will follow-up sooner if he notes any change.  Norleen Notice 02/16/2024, 10:36 AM        [1]  Allergies Allergen Reactions   Enalapril  Shortness Of Breath   Hydrocodone-Acetaminophen  Nausea Only    Can take codein

## 2024-02-20 NOTE — Progress Notes (Signed)
 [facility] Worker Note Stroke Post Discharge Follow-Up  Johnny [name] [id number]   Contact Type:  Other (enter comment) (Chart review) Encounter Date: 02/20/2024  Outreach Project:  Stroke post discharge follow-up Managed Medicaid Plan Participant:   PCP: Yes - See Care Teams in patient chart Payor Status: Does the patient have health insurance (Y/N): Yes Payor Name: : [facility]    Community Health Worker Documentation     Row Name 02/20/24 1458     Post-Stroke CHW Follow-Up Telephone Call   Discharge Date 02/02/24   Discharge Location [facility]   How have you been feeling since being released from the hospital? N/A  Chart review only, pt with [facility]   Program RN notified of new or worsening symptoms N/A   Depression Screening Exception: Other- indicate reason in comment box  Chart review only, pt f/u with [facility]     Functional Questionnaire - ADL's   Bathing Independent  Per chart reviw   Dressing Independent   Meal Prep Dependent   Eating Independent   Maintaining Continence Independent   Ambulation/Transferring Independent   Does the patient have support for these ADL's Yes  Per chart review pt lives with family     Post-Discharge Instructions Reviewed   Did the patient receive and understand the discharge instructions provided? --  Chart review only, pt followed up with [facility]     Follow-Up Appointments Review   PCP Hospital F/U appointment scheduled? Yes   Did the patient keep PCP discharge appointment? Yes  seen PCP on 02/06/2024   Specialist Hospital F/U appointment scheduled? Yes   Did the patient keep the specialist appointment Yes  Pt has a future appt on 03/04/24   Did the patient have referral(s) to PT/OT/SLP post discharge? None     Risk Factor Follow-Up   Is the pateint diabetic? Yes  Per chart review   Does the patient have hypertension? Yes  per chart review   Does patient smoke, use tobacco product, and/or vape?  No  per chart review    Does the patient have nutritional needs/restrictions? Yes   Does the patient have nutritional needs/restrictions? low sodium/ Heart healthy  per chart review     Depression Screening Exception Documentation   Depression Screening Exception Comment: pt being followed up by [facility]       Past Medical History:  Diagnosis Date   Arthritis    At risk for sleep apnea    STOP-BANG=  6   SENT TO PCP 05-21-2013   Benign positional vertigo    Benign prostatic hypertrophy    Blood transfusion without reported diagnosis 2000   CAD (coronary artery disease)    CARDIOLOGIST--  DR DANELLE BIRMINGHAM   CHF (congestive heart failure) (HCC)    Chronic ischemic heart disease, unspecified    ED (erectile dysfunction) of organic origin    GERD (gastroesophageal reflux disease)    History of CHF (congestive heart failure)    systolic   HTN (hypertension)    Hyperlipidemia    Peripheral vascular disease    Tubular adenoma of colon 12/2009   Type 2 diabetes mellitus (HCC)    Wears glasses    Social History   Substance and Sexual Activity  Alcohol  Use No   Comment: seldom   Social History   Substance and Sexual Activity  Drug Use No   Tobacco Use History[1]  SDOH Screenings   Food Insecurity: No Food Insecurity (02/05/2024)  Housing: Unknown (02/05/2024)  Transportation Needs:  No Transportation Needs (02/05/2024)  Utilities: Not At Risk (02/05/2024)  Alcohol  Screen: Low Risk (05/16/2023)  Depression (PHQ2-9): Low Risk (02/06/2024)  Financial Resource Strain: Low Risk (05/16/2023)  Physical Activity: Sufficiently Active (05/16/2023)  Social Connections: Socially Integrated (02/02/2024)  Stress: No Stress Concern Present (05/16/2023)  Tobacco Use: Medium Risk (02/16/2024)  Health Literacy: Adequate Health Literacy (05/16/2023)   Referrals (if applicable):  N/A      Education:    Limiting Factors:  N/A   Follow-up: [facility] followed up with pt post discharge. Per health equity protocol, no future  follow up to be scheduled.    Follow-up Type:  Chart review    Cherise LOISE Finder, NT         [1]  Social History Tobacco Use  Smoking Status Former   Current packs/day: 0.00   Average packs/day: 1 pack/day for 22.2 years (22.2 ttl pk-yrs)   Types: Cigarettes   Start date: 03/07/1948   Quit date: 05/22/1970   Years since quitting: 53.7   Passive exposure: Past  Smokeless Tobacco Never

## 2024-02-23 ENCOUNTER — Ambulatory Visit: Admitting: Vascular Surgery

## 2024-02-23 ENCOUNTER — Ambulatory Visit (HOSPITAL_COMMUNITY)

## 2024-03-01 ENCOUNTER — Telehealth: Payer: Self-pay

## 2024-03-01 ENCOUNTER — Telehealth: Payer: Self-pay | Admitting: Cardiovascular Disease

## 2024-03-01 NOTE — Telephone Encounter (Signed)
 Pts wife calling to ask if pt needs to continue wearing heart monitor or not, she states the device prompted them with this message.  Please advise.

## 2024-03-01 NOTE — Telephone Encounter (Signed)
 Copied from CRM [id number]. Topic: Clinical - Medical Advice >> Mar 01, 2024  1:52 PM Thersia BROCKS wrote: Reason for CRM: Patient wife called in stated he had a stroke through thanksgiving , received a heart monitor just a message saying he is done wearing it but have to have permission from provider to turn it off

## 2024-03-01 NOTE — Telephone Encounter (Signed)
 Spoke with pt's wife per [facility] regarding his heart monitor. Wife stated the heart monitor is giving them a message to contact provider about whether to continue wearing the monitor or not. Wife stated the heart monitor was placed 12/1. Wife was told the pt is to wear it for 30 days, so through to next week. Pt verbalized understanding. All questions if any were answered.

## 2024-03-04 ENCOUNTER — Ambulatory Visit: Admitting: Family Medicine

## 2024-03-04 ENCOUNTER — Encounter: Payer: Self-pay | Admitting: Internal Medicine

## 2024-03-04 ENCOUNTER — Ambulatory Visit: Admitting: Internal Medicine

## 2024-03-04 ENCOUNTER — Encounter: Payer: Self-pay | Admitting: Family Medicine

## 2024-03-04 VITALS — BP 154/74 | Ht 71.0 in | Wt 210.0 lb

## 2024-03-04 VITALS — BP 147/78 | HR 65 | Ht 71.0 in | Wt 210.2 lb

## 2024-03-04 DIAGNOSIS — Z7984 Long term (current) use of oral hypoglycemic drugs: Secondary | ICD-10-CM | POA: Diagnosis not present

## 2024-03-04 DIAGNOSIS — E1122 Type 2 diabetes mellitus with diabetic chronic kidney disease: Secondary | ICD-10-CM | POA: Diagnosis not present

## 2024-03-04 DIAGNOSIS — I63411 Cerebral infarction due to embolism of right middle cerebral artery: Secondary | ICD-10-CM | POA: Diagnosis not present

## 2024-03-04 DIAGNOSIS — E1159 Type 2 diabetes mellitus with other circulatory complications: Secondary | ICD-10-CM

## 2024-03-04 DIAGNOSIS — N183 Chronic kidney disease, stage 3 unspecified: Secondary | ICD-10-CM | POA: Diagnosis not present

## 2024-03-04 DIAGNOSIS — E1142 Type 2 diabetes mellitus with diabetic polyneuropathy: Secondary | ICD-10-CM

## 2024-03-04 DIAGNOSIS — N1832 Chronic kidney disease, stage 3b: Secondary | ICD-10-CM

## 2024-03-04 MED ORDER — RYBELSUS 3 MG PO TABS: 3.0000 mg | ORAL_TABLET | Freq: Every day | ORAL | 6 refills | Status: AC

## 2024-03-04 NOTE — Progress Notes (Unsigned)
 "   Name: Johnny [name]  Age/ Sex: 78 y.o., male   MRN/ DOB: [id number], 05/04/1945     PCP: Garald Karlynn GAILS, MD   Reason for Endocrinology Evaluation: Type 2 Diabetes Mellitus  Initial Endocrine Consultative Visit: 09/15/2016    PATIENT IDENTIFIER: [name] is a 78 y.o. male with a past medical history of T2DM, CAD, HTN and dyslipidemia . The patient has followed with Endocrinology clinic since 09/15/2016 for consultative assistance with management of his diabetes.  DIABETIC HISTORY:  Johnny [name] was diagnosed with DM 1999, has been on oral glycemic agents since his dx . His hemoglobin A1c has ranged from 6.9% in 2021, peaking at 9.3% in 2022.  Transitioned from Dr. Kassie 08/2021   On his initial visit with me in June 2023 ,he had an A1c of 7.0%,   he was on metformin , repaglinide , bromocriptine , and Rybelsus .  Patient was not taking repaglinide   (unknown reason ), we stopped bromocriptine ,  increased Rybelsus , but the patient contacted the office stating that it is cost prohibitive.  So we started glipizide  in June 2023  Stopped metformin  07/2022 due to low GFR  I attempted to prescribe Rybelsus  2024 but this was cost prohibitive  SUBJECTIVE:   During the last visit (11/01/2023): A1c 8.2%    Today (03/04/2024): Johnny [name] is here for a follow up on diabetes management.  He is accompanied by his spouse today.  He checks his blood sugars 1-2 times daily.  No meter today  Since his last visit here, the patient has been diagnosed with ischemic stroke, presenting with expressive aphasia and facial droop in November, 2025   Patient continues to follow-up with vascular surgery for peripheral artery disease and claudication.  Patient follows with cardiology, limited treatment options due to CKD He follows with nephrology    Denies nausea Has chronic constipation but no diarrhea    HOME DIABETES REGIMEN:  Glipizide  10 mg, 2 tabs before breakfast and 1 tablet before  supper Jardiance  25 mg daily       Statin: yes ACE-I/ARB: yes    METER DOWNLOAD SUMMARY: 12/15-12/29/2025  Average Number Tests/Day = 2.4 Overall Mean FS Glucose = 151 Standard Deviation = 43  BG Ranges: Low = 70 High = 238  BG Target % Results: % In target = 76 % Over target = 24 % Under target = 0  Hypoglycemic Events/30 Days: BG < 50 = 0 Episodes of symptomatic severe hypoglycemia = 0     DIABETIC COMPLICATIONS: Microvascular complications:  CKD III, neuropathy Denies: retinopathy Last Eye Exam: Completed 04/08/2022  Macrovascular complications:  CAD ( S/P CABG ), CVA (01/2024), pVD   HISTORY:  Past Medical History:  Past Medical History:  Diagnosis Date   Arthritis    At risk for sleep apnea    STOP-BANG=  6   SENT TO PCP 05-21-2013   Benign positional vertigo    Benign prostatic hypertrophy    Blood transfusion without reported diagnosis 2000   CAD (coronary artery disease)    CARDIOLOGIST--  DR DANELLE [place]   CHF (congestive heart failure) (HCC)    Chronic ischemic heart disease, unspecified    ED (erectile dysfunction) of organic origin    GERD (gastroesophageal reflux disease)    History of CHF (congestive heart failure)    systolic   HTN (hypertension)    Hyperlipidemia    Peripheral vascular disease    Tubular adenoma of colon 12/2009   Type 2 diabetes mellitus (  HCC)    Wears glasses    Past Surgical History:  Past Surgical History:  Procedure Laterality Date   ABDOMINAL AORTOGRAM W/LOWER EXTREMITY N/A 10/09/2023   Procedure: ABDOMINAL AORTOGRAM W/LOWER EXTREMITY;  Surgeon: Pearline Norman RAMAN, MD;  Location[facility] INVASIVE CV LAB;  Service: Cardiovascular;  Laterality: N/A;   CARDIAC CATHETERIZATION  03-03-2000  dr danelle taylor   normal lvsf/  two-vessel cad significant complex stenosis at distal left main   CARDIOVASCULAR STRESS TEST  08-09-2010  DR DANELLE TAYLOR   normal lexiscan  nuclear study/  no ischemia/  normal lvf/  ef 54%    CATARACT EXTRACTION W/ INTRAOCULAR LENS IMPLANT Right 03/07/2009   COLONOSCOPY  10/09/2020   12/30/2009   CORONARY ARTERY BYPASS GRAFT  03-06-2000   DR HZMYJMIU   third vessel   LOWER EXTREMITY ANGIOGRAPHY N/A 10/09/2023   Procedure: Lower Extremity Angiography;  Surgeon: Pearline Norman RAMAN, MD;  Location: [facility] INVASIVE CV LAB;  Service: Cardiovascular;  Laterality: N/A;   PENILE PROSTHESIS IMPLANT  02/08/2010   COLOPLAST 3-PIECE INFLATABLE   PENILE PROSTHESIS IMPLANT N/A 05/27/2013   Procedure: CYSTO REMOVAL OF PENILE PROSTHESIS;  Surgeon: Mark C Ottelin, MD;  Location[facility];  Service: Urology;  Laterality: N/A;   POLYPECTOMY  12/30/2009   +TA   TOTAL HIP ARTHROPLASTY  01/24/2011   Procedure: TOTAL HIP ARTHROPLASTY;  Surgeon: Dempsey GAILS [name];  Location: [facility];  Service: Orthopedics;  Laterality: Left;   TOTAL HIP ARTHROPLASTY Right 01/30/2013   Procedure: RIGHT TOTAL HIP ARTHROPLASTY;  Surgeon: Dempsey GAILS Moan, MD;  Location: [facility];  Service: Orthopedics;  Laterality: Right;   Social History:  reports that he quit smoking about 53 years ago. His smoking use included cigarettes. He started smoking about 76 years ago. He has a 22.2 pack-year smoking history. He has been exposed to tobacco smoke. He has never used smokeless tobacco. He reports that he does not drink alcohol  and does not use drugs. Family History:  Family History  Problem Relation Age of Onset   Hypertension Mother    Diabetes Father    Cancer Sister 77       ovarian ca   Cancer - Other Brother        cancer all over   Coronary artery disease Other        1st degree male relative   Hypertension Other    Colon cancer Neg Hx    Stomach cancer Neg Hx    Esophageal cancer Neg Hx    Pancreatic cancer Neg Hx    Liver disease Neg Hx    Colon polyps Neg Hx    Rectal cancer Neg Hx    Seizures Neg Hx    Stroke Neg Hx    Sleep apnea Neg Hx    Migraines Neg Hx      HOME MEDICATIONS: Allergies as of  03/04/2024       Reactions   Enalapril  Shortness Of Breath   Hydrocodone-acetaminophen  Nausea Only   Can take codein        Medication List        Accurate as of March 04, 2024  2:52 PM. If you have any questions, ask your nurse or doctor.          albuterol  108 (90 Base) MCG/ACT inhaler Commonly known as: Ventolin  HFA INHALE 2 PUFFS INTO THE LUNGS EVERY 4 HOURS AS NEEDED FOR WHEEZING OR SHORTNESS OF BREATH   allopurinol  100 MG tablet Commonly known as:  ZYLOPRIM  TAKE 1 TABLET EVERY DAY   amLODipine  5 MG tablet Commonly known as: NORVASC  Take 1 tablet (5 mg total) by mouth daily.   aspirin  EC 81 MG tablet Take 1 tablet (81 mg total) by mouth daily. Swallow whole.   atorvastatin  40 MG tablet Commonly known as: LIPITOR Take 1 tablet (40 mg total) by mouth daily.   cholecalciferol 1000 units tablet Commonly known as: VITAMIN D Take 1,000 Units by mouth every morning.   clopidogrel  75 MG tablet Commonly known as: PLAVIX  Take 1 tablet (75 mg total) by mouth daily.   empagliflozin  25 MG Tabs tablet Commonly known as: Jardiance  Take 1 tablet (25 mg total) by mouth daily before breakfast.   finasteride  5 MG tablet Commonly known as: PROSCAR  TAKE 1 TABLET EVERY MORNING   glipiZIDE  10 MG tablet Commonly known as: GLUCOTROL  Take 2 tablets (20 mg total) by mouth daily before breakfast AND 1 tablet (10 mg total) daily before supper.   MULTI-VITAMIN GUMMIES PO Take 1 tablet by mouth daily.   pantoprazole  40 MG tablet Commonly known as: PROTONIX  TAKE 1 TABLET EVERY MORNING   tamsulosin  0.4 MG Caps capsule Commonly known as: FLOMAX  TAKE 1 CAPSULE EVERY DAY         OBJECTIVE:   Vital Signs: BP (!) 154/74   Ht 5' 11 (1.803 m)   Wt 210 lb (95.3 kg)   BMI 29.29 kg/m   Wt Readings from Last 3 Encounters:  03/04/24 210 lb (95.3 kg)  03/04/24 210 lb 3.2 oz (95.3 kg)  02/16/24 221 lb (100.2 kg)     Exam: General: Pt appears well and is in NAD   Lungs: Clear with good BS bilat  Heart: RRR   Extremities: No pretibial edema   Neuro: MS is good with appropriate affect, pt is alert and Ox3    DM foot exam: 11/01/2023  The skin of the feet is intact without sores or ulcerations. The pedal pulses are undetectable The sensation is intact to a screening 5.07, 10 gram monofilament bilaterally     DATA REVIEWED:  Lab Results  Component Value Date   HGBA1C 8.3 (H) 02/01/2024   HGBA1C 8.3 (A) 11/01/2023   HGBA1C 8.2 (H) 07/26/2023     Latest Reference Range & Units 02/06/24 09:53  Sodium 135 - 145 mEq/L 137  Potassium 3.5 - 5.1 mEq/L 4.3  Chloride 96 - 112 mEq/L 104  CO2 19 - 32 mEq/L 26  Glucose 70 - 99 mg/dL 824 (H)  BUN 6 - 23 mg/dL 34 (H)  Creatinine 9.59 - 1.50 mg/dL 7.86 (H)  Calcium  8.4 - 10.5 mg/dL 9.7  GFR >39.99 mL/min 28.93 (L)    Old records , labs and images have been reviewed.    ASSESSMENT / PLAN / RECOMMENDATIONS:   1) Type 2 Diabetes Mellitus, Poorly controlled, With neuropathic, CKD III  and macrovascular  complications - Most recent A1c of 8.3 %. Goal A1c < 7.0 %.    -Patient continues with hypoglycemia -He was recently discharged from the hospital for a CVA.  Upon his discharge instructions, he was advised to hold the glipizide  in the evening if his BG readings < 160 mg/dL.  Patient has been noted with fasting hyperglycemia when his glipizide  is held the prior night -I will decrease his morning glipizide , and the patient was advised to take his glipizide  in the evening before supper -Patient tends to snack in the morning on a banana after taking glipizide , he goes to exercise  followed by eating breakfast.  I did explain to the patient that the best way is to take glipizide  before breakfast rather than a snack and to avoid a snack if possible.  My suggestion was to take glipizide , followed by breakfast, followed by exercise -I did offer a referral to our CDE for further discussion -We had to stop  metformin  due to a GFR <35 -Rybelsus  historically has been cost prohibitive, we again discussed the cardiovascular benefits, they are concerned about the cost, [employer] does not offer patient assistance, I will send a prescription to the pharmacy and they will check on the price   MEDICATIONS:  Continue Jardiance  25 mg daily Change glipizide  10 mg, 1 tablet twice daily Rybelsus  3 mg daily   EDUCATION / INSTRUCTIONS: BG monitoring instructions: Patient is instructed to check his blood sugars 1 times a day, fasting. Call  Endocrinology clinic if: BG persistently < 70  I reviewed the Rule of 15 for the treatment of hypoglycemia in detail with the patient. Literature supplied.   2) Diabetic complications:  Eye: Does not have known diabetic retinopathy.  Neuro/ Feet: Does  have known diabetic peripheral neuropathy .  Renal: Patient does have known baseline CKD. He   is  on an ACEI/ARB at present.    3)CKD III:  -GFR has been fluctuating -Patient on ARB - Follows with Nephrology   F/U in 3 months   I spent 25 minutes preparing to see the patient by review of recent labs, imaging and procedures, obtaining and reviewing separately obtained history, communicating with the patient/family or caregiver, ordering medications, tests or procedures, and documenting clinical information in the EHR including the differential Dx, treatment, and any further evaluation and other management    Signed electronically by: Stefano Redgie Butts, MD  [facility]  [facility] [address] [place], [place] [address] Phone: [phone] FAX: [phone]   CC: Garald Karlynn GAILS, MD [address] [place] [address] Phone: [phone]  Fax: [phone]  Return to Endocrinology clinic as below: Future Appointments  Date Time Provider Department Center  03/11/2024  1:30 PM Loreda Hacker, DPM [facility]  03/14/2024  3:00 PM  Bonnell Izetta SAUNDERS, [facility]  03/25/2024  2:20 PM Plotnikov, Karlynn GAILS, MD [facility]  05/06/2024  3:20 PM Plotnikov, Karlynn GAILS, MD [facility]  06/24/2024  1:10 PM [facility]NUAL WELLNESS VISIT [facility]  06/24/2024  2:00 PM Plotnikov, Karlynn GAILS, MD [facility]  08/09/2024  2:00 PM [facility]  08/09/2024  2:40 PM Pearline Norman RAMAN, MD [facility]  08/13/2024  1:00 PM Roark Rush, MD [facility] None  09/09/2024  8:30 AM Cary No, NP [facility] None    "

## 2024-03-04 NOTE — Patient Instructions (Addendum)
 Below is our plan:  Stroke: right MCA infarcts mostly in the R BG likely secondary to large vessel disease source, although cardioembolic source can not be completely ruled out: Residual deficit: none. Continue aspirin  81 mg daily and clopidogrel  75 mg daily until 05/02/2024 then continue Plavix  alone. Continue atorvastatin  40mg  daily for secondary stroke prevention. Discussed secondary stroke prevention measures and importance of close PCP follow up for aggressive stroke risk factor management. I have gone over the pathophysiology of stroke, warning signs and symptoms, risk factors and their management in some detail with instructions to go to the closest emergency room for symptoms of concern. HTN: BP goal <130/90.  Elevated today. 147/78. Stable t home on amlodipine . Continue per PCP HLD: LDL goal <70. Recent LDL 98. Continue atorvastatin  40mg  daily per PCP.  DMII: A1c goal<7.0. Recent A1c 8.3. Continue Jardiance  and glipizide  as directed by PCP. Consult with endocrinology, today.  Parotid gland lesion: 5 mm T2 hyperintense lesion within the superficial lobe of the       right parotid gland, nonspecific, but could reflect a small primary       salivary neoplasm.  Goals:  1) Maintain strict control of hypertension with blood pressure goal below 130/90 2) Maintain good control of diabetes with hemoglobin A1c goal below 7%  3) Maintain good control of lipids with LDL cholesterol goal below 70 mg/dL.  4) Eat a healthy diet with plenty of whole grains, cereals, fruits and vegetables, exercise regularly and maintain ideal body weight   Resources: https://[email or web address].biz/  Please make sure you are staying well hydrated. I recommend 50-60 ounces daily. Well balanced diet and regular exercise encouraged. Consistent sleep schedule with 6-8 hours recommended.   Please continue follow up with care team as directed.   Follow up with me in  6 months   You may receive a survey regarding today's visit. I encourage you to leave honest feed back as I do use this information to improve patient care. Thank you for seeing me today!

## 2024-03-04 NOTE — Patient Instructions (Addendum)
" °  Take Glipizide  10 mg , 1 tablet before Breakfast and 1 tablet before Supper  Take Jardiance  25 mg , 1 tablet before Breakfast    I will send a prescription for Rybelsus  3 mg daily, take 1 tablet before breakfast-if the price is good please start taking it, if it is expensive, please disregard  HOW TO TREAT LOW BLOOD SUGARS (Blood sugar LESS THAN 70 MG/DL) Please follow the RULE OF 15 for the treatment of hypoglycemia treatment (when your (blood sugars are less than 70 mg/dL)   STEP 1: Take 15 grams of carbohydrates when your blood sugar is low, which includes:  3-4 GLUCOSE TABS  OR 3-4 OZ OF JUICE OR REGULAR SODA OR ONE TUBE OF GLUCOSE GEL    STEP 2: RECHECK blood sugar in 15 MINUTES STEP 3: If your blood sugar is still low at the 15 minute recheck --> then, go back to STEP 1 and treat AGAIN with another 15 grams of carbohydrates. "

## 2024-03-04 NOTE — Progress Notes (Signed)
 " [facility] [address] [place]. [place] [address] [phone]       HOSPITAL FOLLOW UP NOTE  [name] Date of Birth: 10-Jan-1946 Medical Record Number: [id number]   Reason for Referral:  hospital stroke follow up    SUBJECTIVE:   CHIEF COMPLAINT:  Chief Complaint  Patient presents with   RM 6     Patient is here with daughter and wife for a stroke follow-up -  would [facility]     HPI:   Johnny [name] is a 78 y.o. who  has a past medical history of Arthritis, At risk for sleep apnea, Benign positional vertigo, Benign prostatic hypertrophy, Blood transfusion without reported diagnosis (2000), CAD (coronary artery disease), CHF (congestive heart failure) (HCC), Chronic ischemic heart disease, unspecified, ED (erectile dysfunction) of organic origin, GERD (gastroesophageal reflux disease), History of CHF (congestive heart failure), HTN (hypertension), Hyperlipidemia, Peripheral vascular disease, Tubular adenoma of colon (12/2009), Type 2 diabetes mellitus (HCC), and Wears glasses.  Patient presented on 01/31/2024 with speech difficulty and confusion. 1. 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 No associated hemorrhage or mass effect. 2. Few additional punctate foci of acute ischemia at the adjacent right frontotemporal region. 3. Few small remote lacunar infarcts involving the left caudate nucleus and left thalamus.He was started on asa and Plavix  for three months due to R M1 occlusion then Plavix  alone. LDL 98. Atorvastatin  started. A1C 8.3 on Jardiance  and glipizide . Personally reviewed hospitalization pertinent progress notes, lab work and imaging.  Evaluated by Dr Jerri.   Since discharge, he reports doing well. Speech is back to baseline. No therapy required. No weakness. He denies recurrent stroke like symptoms. He continues Plavix  and aspirin . No unusual bleeding. Tolerating atorvastatin . BP is usually  120-130/80. He has completed cardiac monitor. No known events. A1C 8.3. he has appt with endocrinology, today. He is taking Jardiance  and glipizide  as prescribed. CBS range form 75-200.   He has returned to driving but not alone. He wishes to resume exercise routine. 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 He does not drink alcohol  and does not smoke. He feels memory is good. No cognitive complaints. He lives with his wife. He does not snore. Wife reports that he sleeps well. He does endorse some anxiety about having another stroke.    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 Few additional punctate foci of acute ischemia at the adjacent right frontotemporal region. Few small remote lacunar infarcts involving the left caudate nucleus and left thalamus. CTA head and neck Occlusion of the right M1 segment with moderate reconstitution of the M2 inferior division. Moderate right and mild left intracranial ICA stenoses. CTP 0/36cc 2D Echo  EF 60-65%   A1C Lab Results  Component Value Date   HGBA1C 8.3 (H) 02/01/2024    Lipid Panel     Component Value Date/Time   CHOL 158 02/01/2024 0117   TRIG 151 (H) 02/01/2024 0117   HDL 30 (L) 02/01/2024 0117   CHOLHDL 5.3 02/01/2024 0117   VLDL 30 02/01/2024 0117   LDLCALC 98 02/01/2024 0117   LDLDIRECT 93.0 06/07/2022 1531      ROS:   14 system review of systems performed and negative with exception of those listed in HPI  PMH:  Past  Medical History:  Diagnosis Date   Arthritis    At risk for sleep apnea    STOP-BANG=  6   SENT TO PCP 05-21-2013   Benign positional vertigo    Benign prostatic hypertrophy    Blood transfusion without reported diagnosis 2000   CAD (coronary artery disease)    CARDIOLOGIST--  DR DANELLE BIRMINGHAM   CHF (congestive heart failure) (HCC)    Chronic  ischemic heart disease, unspecified    ED (erectile dysfunction) of organic origin    GERD (gastroesophageal reflux disease)    History of CHF (congestive heart failure)    systolic   HTN (hypertension)    Hyperlipidemia    Peripheral vascular disease    Tubular adenoma of colon 12/2009   Type 2 diabetes mellitus (HCC)    Wears glasses     PSH:  Past Surgical History:  Procedure Laterality Date   ABDOMINAL AORTOGRAM W/LOWER EXTREMITY N/A 10/09/2023   Procedure: ABDOMINAL AORTOGRAM W/LOWER EXTREMITY;  Surgeon: Pearline Norman RAMAN, MD;  Location: [facility] IN[facility]ASIVE [facility];  Service: Cardiovascular;  Laterality: N/A;   CARDIAC CATHETERIZATION  03-03-2000  dr danelle taylor   normal lvsf/  two-vessel cad significant complex stenosis at distal left main   CARDIOVASCULAR STRESS TEST  08-09-2010  DR DANELLE TAYLOR   normal lexiscan  nuclear study/  no ischemia/  normal lvf/  ef 54%   CATARACT EXTRACTION W/ INTRAOCULAR LENS IMPLANT Right 03/07/2009   COLONOSCOPY  10/09/2020   12/30/2009   CORONARY ARTERY BYPASS GRAFT  03-06-2000   DR HZMYJMIU   third vessel   LOWER EXTREMITY ANGIOGRAPHY N/A 10/09/2023   Procedure: Lower Extremity Angiography;  Surgeon: Pearline Norman RAMAN, MD;  Location: [facility] INVASIVE CV LAB;  Service: Cardiovascular;  Laterality: N/A;   PENILE PROSTHESIS IMPLANT  02/08/2010   COLOPLAST 3-PIECE INFLATABLE   PENILE PROSTHESIS IMPLANT N/A 05/27/2013   Procedure: CYSTO REMOVAL OF PENILE PROSTHESIS;  Surgeon: Mark C Ottelin, MD;  Location: [facility];  Service: Urology;  Laterality: N/A;   POLYPECTOMY  12/30/2009   +TA   TOTAL HIP ARTHROPLASTY  01/24/2011   Procedure: TOTAL HIP ARTHROPLASTY;  Surgeon: Dempsey GAILS [name];  Location: [facility];  Service: Orthopedics;  Laterality: Left;   TOTAL HIP ARTHROPLASTY Right 01/30/2013   Procedure: RIGHT TOTAL HIP ARTHROPLASTY;  Surgeon: Dempsey GAILS Moan, MD;  Location: [facility];  Service: Orthopedics;  Laterality: Right;    Social History:   Social History   Socioeconomic History   Marital status: Married    Spouse name: Not on file   Number of children: 2   Years of education: Not on file   Highest education level: Not on file  Occupational History   Occupation: Neurosurgeon: RETIRED  Tobacco Use   Smoking status: Former    Current packs/day: 0.00    Average packs/day: 1 pack/day for 22.2 years (22.2 ttl pk-yrs)    Types: Cigarettes    Start date: 03/07/1948    Quit date: 05/22/1970    Years since quitting: 53.8    Passive exposure: Past   Smokeless tobacco: Never  Vaping Use   Vaping status: Never Used  Substance and Sexual Activity   Alcohol  use: No    Comment: seldom   Drug use: No   Sexual activity: Not Currently  Other Topics Concern   Not on file  Social History Narrative   Regular Exercise-Yes (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)  Married; has 2 daughters   He is a Bishop   Some sweet tea weekly    Social Drivers of Health   Tobacco Use: Medium Risk (03/04/2024)   Patient History    Smoking Tobacco Use: Former    Smokeless Tobacco Use: Never    Passive Exposure: Past  Physicist, Medical Strain: Low Risk (05/16/2023)   Overall Financial Resource Strain (CARDIA)    Difficulty of Paying Living Expenses: Not hard at all  Food Insecurity: No Food Insecurity (02/05/2024)   Epic    Worried About Programme Researcher, Broadcasting/film/video in the Last Year: Never true    Ran Out of Food in the Last Year: Never true  Transportation Needs: No Transportation Needs (02/05/2024)   Epic    Lack of Transportation (Medical): No    Lack of Transportation (Non-Medical): No  Physical Activity: Sufficiently Active (05/16/2023)   Exercise Vital Sign    Days of Exercise per Week: 7 days    Minutes of Exercise per Session: 60 min  Stress: No Stress Concern Present (05/16/2023)   [employer] of Occupational Health - Occupational Stress Questionnaire    Feeling of Stress : Not at all  Social  Connections: Socially Integrated (02/02/2024)   Social Connection and Isolation Panel    Frequency of Communication with Friends and Family: More than three times a week    Frequency of Social Gatherings with Friends and Family: More than three times a week    Attends Religious Services: More than 4 times per year    Active Member of Clubs or Organizations: Yes    Attends Banker Meetings: More than 4 times per year    Marital Status: Married  Catering Manager Violence: Not At Risk (02/05/2024)   Epic    Fear of Current or Ex-Partner: No    Emotionally Abused: No    Physically Abused: No    Sexually Abused: No  Depression (PHQ2-9): Low Risk (02/06/2024)   Depression (PHQ2-9)    PHQ-2 Score: 0  Alcohol  Screen: Low Risk (05/16/2023)   Alcohol  Screen    Last Alcohol  Screening Score (AUDIT): 0  Housing: Unknown (02/05/2024)   Epic    Unable to Pay for Housing in the Last Year: No    Number of Times Moved in the Last Year: Not on file    Homeless in the Last Year: No  Utilities: Not At Risk (02/05/2024)   Epic    Threatened with loss of utilities: No  Health Literacy: Adequate Health Literacy (05/16/2023)   B1300 Health Literacy    Frequency of need for help with medical instructions: Never    Family History:  Family History  Problem Relation Age of Onset   Hypertension Mother    Diabetes Father    Cancer Sister 69       ovarian ca   Cancer - Other Brother        cancer all over   Coronary artery disease Other        1st degree male relative   Hypertension Other    Colon cancer Neg Hx    Stomach cancer Neg Hx    Esophageal cancer Neg Hx    Pancreatic cancer Neg Hx    Liver disease Neg Hx    Colon polyps Neg Hx    Rectal cancer Neg Hx    Seizures Neg Hx    Stroke Neg Hx    Sleep apnea Neg Hx    Migraines Neg Hx  Medications:  Medications Ordered Prior to Encounter[1]  Allergies:  Allergies[2]    OBJECTIVE:  Physical Exam  Vitals:   03/04/24  0754  BP: (!) 147/78  Pulse: 65  Weight: 210 lb 3.2 oz (95.3 kg)  Height: 5' 11 (1.803 m)   Body mass index is 29.32 kg/m. No results found.     02/06/2024    9:01 AM  Depression screen PHQ 2/9  Decreased Interest 0  Down, Depressed, Hopeless 0  PHQ - 2 Score 0     General: well developed, well nourished, seated, in no evident distress Head: head normocephalic and atraumatic.   Neck: supple with no carotid or supraclavicular bruits Cardiovascular: regular rate and rhythm, no murmurs Musculoskeletal: no deformity Skin:  no rash/petichiae Vascular:  Normal pulses all extremities   Neurologic Exam Mental Status: Awake and fully alert.  Fluent speech and language.  Oriented to place and time. Recent and remote memory intact. Attention span, concentration and fund of knowledge appropriate. Mood and affect appropriate.  Cranial Nerves: Pupils equal, briskly reactive to light. Extraocular movements full without nystagmus. Visual fields full to confrontation. Hearing intact. Facial sensation intact. Face, tongue, palate moves normally and symmetrically.  Motor: Normal bulk and tone. Normal strength in all tested extremity muscles Sensory.: intact to touch , pinprick , position and vibratory sensation.  Gait and Station: Arises from chair without difficulty. Stance is normal. Gait demonstrates normal stride length and balance with no assistive device.  Reflexes: 1+ and symmetric.    NIHSS  0 Modified Rankin  0    ASSESSMENT: Johnny [name] is a 78 y.o. year old male presenting with speech difficulty and confusion. Vascular risk factors include HTN, HLD, DMT2 and advanced age.      PLAN:  Stroke: right MCA infarcts mostly in the R BG likely secondary to large vessel disease source, although cardioembolic source can not be completely ruled out: Residual deficit: none. Continue aspirin  81 mg daily and clopidogrel  75 mg daily until 05/02/2024 then continue Plavix  alone. Continue  atorvastatin  40mg  daily for secondary stroke prevention. Discussed secondary stroke prevention measures and importance of close PCP follow up for aggressive stroke risk factor management. I have gone over the pathophysiology of stroke, warning signs and symptoms, risk factors and their management in some detail with instructions to go to the closest emergency room for symptoms of concern. HTN: BP goal <130/90.  Elevated today. 147/78. Stable t home on amlodipine . Continue per PCP HLD: LDL goal <70. Recent LDL 98. Continue atorvastatin  40mg  daily per PCP.  DMII: A1c goal<7.0. Recent A1c 8.3. Continue Jardiance  and glipizide  as directed by PCP. Consult with endocrinology, today.  Parotid gland lesion: 5 mm T2 hyperintense lesion within the superficial lobe of the       right parotid gland, nonspecific, but could reflect a small primary       salivary neoplasm.   Follow up in 6 months or call earlier if needed   CC:  [facility] provider: Dr. Rosemarie PCP: Plotnikov, Karlynn GAILS, MD    I spent 45 minutes of face-to-face and non-face-to-face time with patient.  This included previsit chart review including review of recent hospitalization, lab review, study review, order entry, electronic health record documentation, patient education regarding recent stroke including etiology, secondary stroke prevention measures and importance of managing stroke risk factors, residual deficits and typical recovery time and answered all other questions to patient satisfaction   Greig Forbes, FNP-C  [facility] [address] [place], [place] [address]  Phone [phone] Fax [phone] Note: This document was prepared with digital dictation and possible smart phrase technology. Any transcriptional errors that result from this process are unintentional.          [1]  Current Outpatient Medications on File Prior to Visit  Medication Sig Dispense Refill   albuterol  (VENTOLIN  HFA) 108  (90 Base) MCG/ACT inhaler INHALE 2 PUFFS INTO THE LUNGS EVERY 4 HOURS AS NEEDED FOR WHEEZING OR SHORTNESS OF BREATH 18 g 5   amLODipine  (NORVASC ) 5 MG tablet Take 1 tablet (5 mg total) by mouth daily. 30 tablet 11   aspirin  EC 81 MG tablet Take 1 tablet (81 mg total) by mouth daily. Swallow whole. 30 tablet 2   atorvastatin  (LIPITOR) 40 MG tablet Take 1 tablet (40 mg total) by mouth daily. 30 tablet 0   cholecalciferol (VITAMIN D) 1000 UNITS tablet Take 1,000 Units by mouth every morning.     clopidogrel  (PLAVIX ) 75 MG tablet Take 1 tablet (75 mg total) by mouth daily. 30 tablet 3   empagliflozin  (JARDIANCE ) 25 MG TABS tablet Take 1 tablet (25 mg total) by mouth daily before breakfast. 90 tablet 3   finasteride  (PROSCAR ) 5 MG tablet TAKE 1 TABLET EVERY MORNING 90 tablet 3   glipiZIDE  (GLUCOTROL ) 10 MG tablet Take 2 tablets (20 mg total) by mouth daily before breakfast AND 1 tablet (10 mg total) daily before supper. 270 tablet 3   Multiple Vitamins-Minerals (MULTI-VITAMIN GUMMIES PO) Take 1 tablet by mouth daily.     pantoprazole  (PROTONIX ) 40 MG tablet TAKE 1 TABLET EVERY MORNING 90 tablet 3   tamsulosin  (FLOMAX ) 0.4 MG CAPS capsule TAKE 1 CAPSULE EVERY DAY 90 capsule 3   allopurinol  (ZYLOPRIM ) 100 MG tablet TAKE 1 TABLET EVERY DAY (Patient not taking: Reported on 03/04/2024) 90 tablet 3   No current facility-administered medications on file prior to visit.  [2]  Allergies Allergen Reactions   Enalapril  Shortness Of Breath   Hydrocodone-Acetaminophen  Nausea Only    Can take codein   "

## 2024-03-05 ENCOUNTER — Ambulatory Visit

## 2024-03-05 DIAGNOSIS — I639 Cerebral infarction, unspecified: Secondary | ICD-10-CM

## 2024-03-05 LAB — POCT GLYCOSYLATED HEMOGLOBIN (HGB A1C): Hemoglobin A1C: 8.3 % — AB (ref 4.0–5.6)

## 2024-03-06 ENCOUNTER — Other Ambulatory Visit: Payer: Self-pay | Admitting: Internal Medicine

## 2024-03-06 DIAGNOSIS — I639 Cerebral infarction, unspecified: Secondary | ICD-10-CM

## 2024-03-06 NOTE — Telephone Encounter (Unsigned)
 Copied from CRM [id number]. Topic: Clinical - Medication Refill >> Mar 06, 2024  1:25 PM Burnard DEL wrote: Medication: atorvastatin  (LIPITOR) 40 MG tablet  Has the patient contacted their pharmacy? Yes (Agent: If no, request that the patient contact the pharmacy for the refill. If patient does not wish to contact the pharmacy document the reason why and proceed with request.) (Agent: If yes, when and what did the pharmacy advise?)  This is the patient's preferred pharmacy:  [id number] - [place] ([place]), [place] - [address] [place] ([place]) [place] [address] Phone: [phone] Fax: [phone]    Is this the correct pharmacy for this prescription? Yes If no, delete pharmacy and type the correct one.   Has the prescription been filled recently? No  Is the patient out of the medication? Yes  Has the patient been seen for an appointment in the last year OR does the patient have an upcoming appointment? Yes  Can we respond through MyChart? Yes  Agent: Please be advised that Rx refills may take up to 3 business days. We ask that you follow-up with your pharmacy.

## 2024-03-07 ENCOUNTER — Ambulatory Visit: Payer: Self-pay | Admitting: Student

## 2024-03-08 ENCOUNTER — Telehealth: Payer: Self-pay

## 2024-03-08 MED ORDER — ATORVASTATIN CALCIUM 40 MG PO TABS: 40.0000 mg | ORAL_TABLET | Freq: Every day | ORAL | 0 refills | Status: DC

## 2024-03-08 NOTE — Telephone Encounter (Signed)
 Copied from CRM #[id number]. Topic: Clinical - Prescription Issue >> Mar 08, 2024 10:29 AM Kevelyn M wrote: Reason for CRM: Patient's wife calling in because patient is out of his atorvastatin  (LIPITOR) 40 MG tablet. Patient took his last medication on 03/06/2024. Patient's wife is concerned because he's stroke victim. Refill request was sent on 03/06/2024.

## 2024-03-11 ENCOUNTER — Ambulatory Visit (INDEPENDENT_AMBULATORY_CARE_PROVIDER_SITE_OTHER): Admitting: Podiatry

## 2024-03-11 ENCOUNTER — Other Ambulatory Visit: Payer: Self-pay | Admitting: Internal Medicine

## 2024-03-11 ENCOUNTER — Encounter: Payer: Self-pay | Admitting: Podiatry

## 2024-03-11 DIAGNOSIS — M79674 Pain in right toe(s): Secondary | ICD-10-CM | POA: Diagnosis not present

## 2024-03-11 DIAGNOSIS — M79675 Pain in left toe(s): Secondary | ICD-10-CM

## 2024-03-11 DIAGNOSIS — B351 Tinea unguium: Secondary | ICD-10-CM | POA: Diagnosis not present

## 2024-03-11 DIAGNOSIS — E1151 Type 2 diabetes mellitus with diabetic peripheral angiopathy without gangrene: Secondary | ICD-10-CM | POA: Diagnosis not present

## 2024-03-11 MED ORDER — ATORVASTATIN CALCIUM 40 MG PO TABS: 40.0000 mg | ORAL_TABLET | Freq: Every day | ORAL | 3 refills | Status: DC

## 2024-03-11 NOTE — Telephone Encounter (Signed)
 Atorvastatin  renewed.  Thank you

## 2024-03-11 NOTE — Progress Notes (Signed)
 This patient returns to my office for at risk foot care.  This patient requires this care by a professional since this patient will be at risk due to having diabetes with vascular disease. This patient is unable to cut nails himself since the patient cannot reach his nails.These nails are painful walking and wearing shoes.  He presents to the office with his wife.This patient presents for at risk foot care today.  General Appearance  Alert, conversant and in no acute stress.  Vascular  Dorsalis pedis and posterior tibial  pulses are weakly  palpable  bilaterally.  Capillary return is within normal limits  bilaterally. Temperature is within normal limits  bilaterally.  Neurologic  Senn-Weinstein monofilament wire test within normal limits  bilaterally. Muscle power within normal limits bilaterally.  Nails Thick disfigured discolored nails with subungual debris  from hallux to fifth toes bilaterally. No evidence of bacterial infection or drainage bilaterally.  Orthopedic  No limitations of motion  feet .  No crepitus or effusions noted.  No bony pathology or digital deformities noted.  Skin  normotropic skin with no porokeratosis noted bilaterally.  No signs of infections or ulcers noted.     Onychomycosis  Pain in right toes  Pain in left toes  Consent was obtained for treatment procedures.   Mechanical debridement of nails 1-5  bilaterally performed with a nail nipper.  Filed with dremel without incident.    Return office visit     3 months                 Told patient to return for periodic foot care and evaluation due to potential at risk complications.   Helane Gunther DPM

## 2024-03-12 NOTE — Progress Notes (Signed)
Patient has been notified directly; all questions, if any, were answered. Patient voiced understanding.   

## 2024-03-14 ENCOUNTER — Ambulatory Visit: Admitting: Pharmacist

## 2024-03-20 NOTE — Progress Notes (Signed)
 " [facility] Pharmacist Note  Name: Johnny [name]  MRN: [id number]  DOB: [birth year]  Sex: male PCP: Garald Karlynn GAILS, MD CPP Referral Provider: Dr. Pearline  HISTORY OF PRESENT ILLNESS: Johnny [name] is a 79 y.o. male with PMH PAD with claudication and stroke who presents for medication management for cardiovascular risk reduction.   Dyslipidemia/ASCVD  Current lipid-lowering medications: atorvastatin  40 mg daily Previously tried medications/intolerances: lovastatin  Rx affordability and access: [employer]   Current antiplatelets/antithrombotics: aspirin  81 mg, clopidogrel  75 mg daily   Diabetes  Current medications: Rybelsus  3 mg daily, Jardiance  25 mg daily (not taking), glipizide  10 mg twice in the morning and once in the evening  Rx affordability and access: Jardiance  is $200 which is unaffordable - wife is checking with insurance as she believes he has met his deductible and cost should be lower now Recent glucose readings: checked three times per day, reports BG has improved to 110-130s, down from 200s since starting Rybelsus   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  Past Medical History:  Diagnosis Date   Arthritis    At risk for sleep apnea    STOP-BANG=  6   SENT TO PCP 05-21-2013   Benign positional vertigo    Benign prostatic hypertrophy    Blood transfusion without reported diagnosis 2000   CAD (coronary artery disease)    CARDIOLOGIST--  DR DANELLE BIRMINGH[place]   CHF (congestive heart failure) (HCC)    Chronic ischemic heart disease, unspecified    ED (erectile dysfunction) of organic origin    GERD (gastroesophageal reflux disease)    History of CHF (congestive heart failure)    systolic   HTN (hypertension)    Hyperlipidemia    Peripheral vascular disease    Tubular adenoma of colon 12/2009   Type 2 diabetes mellitus (HCC)    Wears glasses    Past Surgical History:  Procedure Laterality Date   ABDOMINAL AORTOGRAM W/LOWER EXTREMITY N/A  10/09/2023   Procedure: ABDOMINAL AORTOGRAM W/LOWER EXTREMITY;  Surgeon: Pearline Norman RAMAN, MD;  Location: [facility];  Service: Cardiovascular;  Laterality: N/A;   CARDIAC CATHETERIZATION  03-03-2000  dr danelle taylor   normal lvsf/  two-vessel cad significant complex stenosis at distal left main   CARDIOVASCULAR STRESS TEST  08-09-2010  DR DANELLE TAYLOR   normal lexiscan  nuclear study/  no ischemia/  normal lvf/  ef 54%   CATARACT EXTRACTION W/ INTRAOCULAR LENS IMPLANT Right 03/07/2009   COLONOSCOPY  10/09/2020   12/30/2009   CORONARY ARTERY BYPASS GRAFT  03-06-2000   DR HZMYJMIU   third vessel   LOWER EXTREMITY ANGIOGRAPHY N/A 10/09/2023   Procedure: Lower Extremity Angiography;  Surgeon: Pearline Norman RAMAN, MD;  [facility]: [facility] INVASIVE CV LAB;  Service: Cardiovascular;  Laterality: N/A;   PENILE PROSTHESIS IMPLANT  02/08/2010   COLOPLAST 3-PIECE INFLATABLE   PENILE PROSTHESIS IMPLANT N/A 05/27/2013   Procedure: CYSTO REMOVAL OF PENILE PROSTHESIS;  Surgeon: Mark C Ottelin, MD;  Location: [facility];  Service: Urology;  Laterality: N/A;   POLYPECTOMY  12/30/2009   +TA   TOTAL HIP ARTHROPLASTY  01/24/2011   Procedure: TOTAL HIP ARTHROPLASTY;  Surgeon: Dempsey GAILS [name];  Location: [facility];  Service: Orthopedics;  Laterality: Left;   TOTAL HIP ARTHROPLASTY Right 01/30/2013   Procedure: RIGHT TOTAL HIP ARTHROPLASTY;  Surgeon: Dempsey GAILS Moan, MD;  Location: [facility];  Service: Orthopedics;  Laterality: Right;   Family History  Problem Relation Age of Onset  Hypertension Mother    Diabetes Father    Cancer Sister 11       ovarian ca   Cancer - Other Brother        cancer all over   Coronary artery disease Other        1st degree male relative   Hypertension Other    Colon cancer Neg Hx    Stomach cancer Neg Hx    Esophageal cancer Neg Hx    Pancreatic cancer Neg Hx    Liver disease Neg Hx    Colon polyps Neg Hx    Rectal cancer Neg Hx    Seizures Neg Hx    Stroke Neg  Hx    Sleep apnea Neg Hx    Migraines Neg Hx    LABS: Lab Results  Component Value Date   CHOL 158 02/01/2024   HDL 30 (L) 02/01/2024   LDLCALC 98 02/01/2024   LDLDIRECT 93.0 06/07/2022   TRIG 151 (H) 02/01/2024   CHOLHDL 5.3 02/01/2024    Lab Results  Component Value Date   CREATININE 2.13 (H) 02/06/2024   BUN 34 (H) 02/06/2024   NA 137 02/06/2024   K 4.3 02/06/2024   CL 104 02/06/2024   CO2 26 02/06/2024   CrCl cannot be calculated (Patient's most recent lab result is older than the maximum 21 days allowed.).      Component Value Date/Time   PROT 6.0 (L) 02/01/2024 0117   ALBUMIN 3.0 (L) 02/01/2024 0117   AST 15 02/01/2024 0117   ALT 13 02/01/2024 0117   ALKPHOS 67 02/01/2024 0117   BILITOT 0.8 02/01/2024 0117   BILIDIR 0.1 05/07/2018 1607    Lab Results  Component Value Date   HGBA1C 8.3 (A) 03/05/2024    ASSESSMENT & PLAN:  Dyslipidemia LDL above goal < 55 mg/dL. Last LDL 98 mg/dL on 88/72/74 prior to switching to atorvastatin . Plan to obtain updated lipid panel today.  Continue atorvastatin  40 mg daily  Counseled patient on treatment, including efficacy, dosing, administration, possible adverse effects, and anticipated cost.  Reviewed long-term complications of uncontrolled cholesterol.  Reviewed goals for cholesterol readings with patient.  Reviewed dietary and lifestyle modifications to improve cholesterol.  Repeat lipid panel in 4-12 weeks.   Antiplatelets/Antithrombotics Patient with no recent revascularization. History of stroke in November 2025. Continue aspirin  81 mg daily and Plavix  75 mg daily  Counseled patient on treatment, including efficacy, dosing, administration, possible adverse effects, and anticipated cost.   Other Cardiovascular Risk Factors: A1C above goal <7%. Recent home blood glucose readings are improved since starting on Rybelsus . He will continue to follow up with endocrinology.  BP is slightly elevated today. Recommend monitoring  his BP at home. Confirmed he has a home BP cuff. Counseled on proper blood pressure monitoring technique and reviewed blood pressure goal. Instructed patient to check BP at home and bring log of readings to next visit.   Recommend annual influenza and COVID vaccines.   Follow up: follow up via telephone to discuss LDL results and determine follow up   Jenkins Graces, PharmD PGY1 Pharmacy Resident  Izetta Henry, PharmD, CPP [facility] [phone] "

## 2024-03-21 ENCOUNTER — Ambulatory Visit: Attending: Surgery | Admitting: Pharmacist

## 2024-03-21 VITALS — BP 136/57 | HR 60 | Wt 214.0 lb

## 2024-03-21 DIAGNOSIS — I739 Peripheral vascular disease, unspecified: Secondary | ICD-10-CM

## 2024-03-21 NOTE — Patient Instructions (Signed)
-   I will call you to go over your cholesterol results. Your LDL goal is <55. - Please monitor your blood pressure at home at least a few times a week. Your goal blood pressure is less than 130/80.

## 2024-03-22 ENCOUNTER — Ambulatory Visit: Payer: Self-pay | Admitting: Pharmacist

## 2024-03-22 LAB — LDL CHOLESTEROL, DIRECT: LDL Direct: 51 mg/dL (ref 0–99)

## 2024-03-25 ENCOUNTER — Encounter: Payer: Self-pay | Admitting: Internal Medicine

## 2024-03-25 ENCOUNTER — Ambulatory Visit: Admitting: Internal Medicine

## 2024-03-25 VITALS — BP 132/64 | HR 64 | Ht 71.0 in | Wt 214.2 lb

## 2024-03-25 DIAGNOSIS — I2510 Atherosclerotic heart disease of native coronary artery without angina pectoris: Secondary | ICD-10-CM

## 2024-03-25 DIAGNOSIS — G4700 Insomnia, unspecified: Secondary | ICD-10-CM | POA: Insufficient documentation

## 2024-03-25 DIAGNOSIS — Z8673 Personal history of transient ischemic attack (TIA), and cerebral infarction without residual deficits: Secondary | ICD-10-CM

## 2024-03-25 DIAGNOSIS — E1159 Type 2 diabetes mellitus with other circulatory complications: Secondary | ICD-10-CM

## 2024-03-25 DIAGNOSIS — R202 Paresthesia of skin: Secondary | ICD-10-CM

## 2024-03-25 DIAGNOSIS — R4701 Aphasia: Secondary | ICD-10-CM

## 2024-03-25 DIAGNOSIS — E559 Vitamin D deficiency, unspecified: Secondary | ICD-10-CM

## 2024-03-25 DIAGNOSIS — F5101 Primary insomnia: Secondary | ICD-10-CM

## 2024-03-25 DIAGNOSIS — N476 Balanoposthitis: Secondary | ICD-10-CM

## 2024-03-25 DIAGNOSIS — I10 Essential (primary) hypertension: Secondary | ICD-10-CM

## 2024-03-25 LAB — CBC WITH DIFFERENTIAL/PLATELET
Basophils Absolute: 0.1 K/uL (ref 0.0–0.1)
Basophils Relative: 0.9 % (ref 0.0–3.0)
Eosinophils Absolute: 0.3 K/uL (ref 0.0–0.7)
Eosinophils Relative: 5.1 % — ABNORMAL HIGH (ref 0.0–5.0)
HCT: 37.4 % — ABNORMAL LOW (ref 39.0–52.0)
Hemoglobin: 12.6 g/dL — ABNORMAL LOW (ref 13.0–17.0)
Lymphocytes Relative: 26.4 % (ref 12.0–46.0)
Lymphs Abs: 1.6 K/uL (ref 0.7–4.0)
MCHC: 33.6 g/dL (ref 30.0–36.0)
MCV: 82.8 fl (ref 78.0–100.0)
Monocytes Absolute: 0.6 K/uL (ref 0.1–1.0)
Monocytes Relative: 10.6 % (ref 3.0–12.0)
Neutro Abs: 3.3 K/uL (ref 1.4–7.7)
Neutrophils Relative %: 57 % (ref 43.0–77.0)
Platelets: 252 K/uL (ref 150.0–400.0)
RBC: 4.52 Mil/uL (ref 4.22–5.81)
RDW: 15.1 % (ref 11.5–15.5)
WBC: 5.9 K/uL (ref 4.0–10.5)

## 2024-03-25 LAB — COMPREHENSIVE METABOLIC PANEL WITH GFR
ALT: 11 U/L (ref 3–53)
AST: 15 U/L (ref 5–37)
Albumin: 3.9 g/dL (ref 3.5–5.2)
Alkaline Phosphatase: 81 U/L (ref 39–117)
BUN: 19 mg/dL (ref 6–23)
CO2: 27 meq/L (ref 19–32)
Calcium: 9.1 mg/dL (ref 8.4–10.5)
Chloride: 104 meq/L (ref 96–112)
Creatinine, Ser: 1.58 mg/dL — ABNORMAL HIGH (ref 0.40–1.50)
GFR: 41.37 mL/min — ABNORMAL LOW
Glucose, Bld: 73 mg/dL (ref 70–99)
Potassium: 4.1 meq/L (ref 3.5–5.1)
Sodium: 141 meq/L (ref 135–145)
Total Bilirubin: 0.5 mg/dL (ref 0.2–1.2)
Total Protein: 6.6 g/dL (ref 6.0–8.3)

## 2024-03-25 LAB — HEMOGLOBIN A1C: Hgb A1c MFr Bld: 7.5 % — ABNORMAL HIGH (ref 4.6–6.5)

## 2024-03-25 LAB — TSH: TSH: 6.06 u[IU]/mL — ABNORMAL HIGH (ref 0.35–5.50)

## 2024-03-25 LAB — VITAMIN D 25 HYDROXY (VIT D DEFICIENCY, FRACTURES): VITD: 38.77 ng/mL (ref 30.00–100.00)

## 2024-03-25 LAB — VITAMIN B12: Vitamin B-12: 280 pg/mL (ref 211–911)

## 2024-03-25 MED ORDER — DEXCOM G7 SENSOR MISC: 11 refills | Status: AC

## 2024-03-25 MED ORDER — ZOLPIDEM TARTRATE 5 MG PO TABS: 5.0000 mg | ORAL_TABLET | Freq: Every evening | ORAL | 3 refills | Status: AC | PRN

## 2024-03-25 MED ORDER — DEXCOM G6 RECEIVER DEVI: 1 refills | Status: AC

## 2024-03-25 NOTE — Patient Instructions (Addendum)
 Valerian root and hopps, melatonin   Recent large-scale observational studies provide strong evidence that the shingles vaccine is associated with a reduced risk of developing dementia. It may also help slow cognitive decline in individuals who already have dementia.  Key Findings from Recent Research Multiple natural experiment studies, which leveraged unique vaccination policies to minimize bias, have consistently found a protective link:  Reduced Risk: One large study, published in [place], analyzed health records from over 280,000 older adults in [place] and found that those who received the live-attenuated shingles vaccine (Zostavax, now discontinued in the [place] ) were approximately 20% less likely to develop dementia over a seven-year follow-up period. Slower Progression: A follow-up study published in Cell indicated that for those already living with dementia, the vaccine appeared to slow the progression of the disease and reduced dementia-related deaths by nearly half over nine years. Vascular Dementia Protection: Other research presented at [facility] 2025 indicated that the vaccine lowered the risk of vascular dementia by 50%. Newer Vaccine: A separate study published in [facility] suggested that the newer, more effective recombinant vaccine (Shingrix, currently used in the [place] ) also offers significant protection against dementia, with an association of lower risk than the older vaccine type. Stronger Effect in Women: Several studies noted that the protective effect against dementia was more pronounced in women than in men, possibly due to differences in immune responses or dementia pathogenesis.  Why Might the Vaccine Help? Researchers are still working to determine the exact mechanism, but current theories center on the idea that preventing the shingles virus (varicella-zoster) from reactivating helps protect the brain:  Reduced Inflammation: The most likely explanation is that the vaccine  prevents shingles infections and subsequent inflammation in the nervous system, which is a known risk factor for neurodegenerative diseases like dementia. Immune System Modulation: It's also possible that the vaccine boosts the immune system more broadly, helping the body combat other processes related to cognitive decline.  Important Considerations Observational Data: While the evidence is strong, these findings primarily come from observational studies, which show an association, not a definitive cause-and-effect relationship. A large-scale randomized controlled trial is the gold standard for conclusive proof, but such trials are difficult to conduct for dementia due to the time and cost involved. Public Health Recommendation: The [place]. Centers for Disease Control and Prevention (CDC) already recommends the two-dose Shingrix vaccine for all healthy adults aged 69 and older primarily to prevent shingles and its painful complications. The potential added benefit for dementia prevention provides another compelling reason to get vaccinated.

## 2024-03-25 NOTE — Progress Notes (Unsigned)
 "  Subjective:  Patient ID: Johnny [name], male    DOB: 1945-07-02  Age: 79 y.o. MRN: [id number]  CC: Medical Management of Chronic Issues (4 Month follow up)   HPI Johnny [name] presents for  No sleep apnea  Outpatient Medications Prior to Visit  Medication Sig Dispense Refill   albuterol  (VENTOLIN  HFA) 108 (90 Base) MCG/ACT inhaler INHALE 2 PUFFS INTO THE LUNGS EVERY 4 HOURS AS NEEDED FOR WHEEZING OR SHORTNESS OF BREATH 18 g 5   allopurinol  (ZYLOPRIM ) 100 MG tablet TAKE 1 TABLET EVERY DAY 90 tablet 3   amLODipine  (NORVASC ) 5 MG tablet Take 1 tablet (5 mg total) by mouth daily. 30 tablet 11   aspirin  EC 81 MG tablet Take 1 tablet (81 mg total) by mouth daily. Swallow whole. 30 tablet 2   atorvastatin  (LIPITOR) 40 MG tablet Take 1 tablet (40 mg total) by mouth daily. 90 tablet 3   cholecalciferol (VITAMIN D ) 1000 UNITS tablet Take 1,000 Units by mouth every morning.     clopidogrel  (PLAVIX ) 75 MG tablet Take 1 tablet (75 mg total) by mouth daily. 30 tablet 3   finasteride  (PROSCAR ) 5 MG tablet TAKE 1 TABLET EVERY MORNING 90 tablet 3   glipiZIDE  (GLUCOTROL ) 10 MG tablet Take 2 tablets (20 mg total) by mouth daily before breakfast AND 1 tablet (10 mg total) daily before supper. 270 tablet 3   Multiple Vitamins-Minerals (MULTI-VITAMIN GUMMIES PO) Take 1 tablet by mouth daily.     pantoprazole  (PROTONIX ) 40 MG tablet TAKE 1 TABLET EVERY MORNING 90 tablet 3   Semaglutide  (RYBELSUS ) 3 MG TABS Take 1 tablet (3 mg total) by mouth daily. 30 tablet 6   tamsulosin  (FLOMAX ) 0.4 MG CAPS capsule TAKE 1 CAPSULE EVERY DAY 90 capsule 3   empagliflozin  (JARDIANCE ) 25 MG TABS tablet Take 1 tablet (25 mg total) by mouth daily before breakfast. (Patient not taking: Reported on 03/21/2024) 90 tablet 3   No facility-administered medications prior to visit.    ROS: Review of Systems  Constitutional:  Negative for appetite change, fatigue and unexpected weight change.  HENT:  Positive for  voice change. Negative for congestion, nosebleeds, sneezing, sore throat and trouble swallowing.   Eyes:  Negative for itching and visual disturbance.  Respiratory:  Negative for cough.   Cardiovascular:  Negative for chest pain, palpitations and leg swelling.  Gastrointestinal:  Negative for abdominal distention, blood in stool, diarrhea and nausea.  Genitourinary:  Negative for frequency and hematuria.  Musculoskeletal:  Negative for back pain, gait problem, joint swelling and neck pain.  Skin:  Negative for rash.  Neurological:  Negative for dizziness, tremors, speech difficulty and weakness.  Psychiatric/Behavioral:  Negative for agitation, dysphoric mood and sleep disturbance. The patient is not nervous/anxious.     Objective:  BP 132/64   Pulse 64   Ht 5' 11 (1.803 m)   Wt 214 lb 3.2 oz (97.2 kg)   SpO2 95%   BMI 29.87 kg/m   BP Readings from Last 3 Encounters:  03/25/24 132/64  03/21/24 (!) 136/57  03/04/24 (!) 154/74    Wt Readings from Last 3 Encounters:  03/25/24 214 lb 3.2 oz (97.2 kg)  03/21/24 214 lb (97.1 kg)  03/04/24 210 lb (95.3 kg)    Physical Exam Constitutional:      General: He is not in acute distress.    Appearance: He is well-developed. He is obese.     Comments: NAD  Eyes:     Conjunctiva/sclera:  Conjunctivae normal.     Pupils: Pupils are equal, round, and reactive to light.  Neck:     Thyroid : No thyromegaly.     Vascular: No JVD.  Cardiovascular:     Rate and Rhythm: Normal rate and regular rhythm.     Heart sounds: Normal heart sounds. No murmur heard.    No friction rub. No gallop.  Pulmonary:     Effort: Pulmonary effort is normal. No respiratory distress.     Breath sounds: Normal breath sounds. No wheezing or rales.  Chest:     Chest [place]: No tenderness.  Abdominal:     General: Bowel sounds are normal. There is no distension.     Palpations: Abdomen is soft. There is no mass.     Tenderness: There is no abdominal tenderness.  There is no guarding or rebound.  Musculoskeletal:        General: No tenderness. Normal range of motion.     Cervical back: Normal range of motion.  Lymphadenopathy:     Cervical: No cervical adenopathy.  Skin:    General: Skin is warm and dry.     Findings: No rash.  Neurological:     Mental Status: He is alert and oriented to person, place, and time.     Cranial Nerves: No cranial nerve deficit.     Motor: No abnormal muscle tone.     Coordination: Coordination normal.     Gait: Gait normal.     Deep Tendon Reflexes: Reflexes are normal and symmetric.  Psychiatric:        Behavior: Behavior normal.        Thought Content: Thought content normal.        Judgment: Judgment normal.     Lab Results  Component Value Date   WBC 6.9 02/01/2024   HGB 11.6 (L) 02/01/2024   HCT 35.7 (L) 02/01/2024   PLT 214 02/01/2024   GLUCOSE 175 (H) 02/06/2024   CHOL 158 02/01/2024   TRIG 151 (H) 02/01/2024   HDL 30 (L) 02/01/2024   LDLDIRECT 51 03/21/2024   LDLCALC 98 02/01/2024   ALT 13 02/01/2024   AST 15 02/01/2024   NA 137 02/06/2024   K 4.3 02/06/2024   CL 104 02/06/2024   CREATININE 2.13 (H) 02/06/2024   BUN 34 (H) 02/06/2024   CO2 26 02/06/2024   TSH 8.88 (H) 07/26/2023   PSA 1.77 07/26/2023   INR 1.2 01/31/2024   HGBA1C 8.3 (A) 03/05/2024    VAS US  ABI WITH/WO TBI Result Date: 02/09/2024  LOWER EXTREMITY DOPPLER STUDY Patient Name:  Johnny [name]  Date of Exam:   02/09/2024 Medical Rec #: [id number]        Accession #:    [id number] Date of Birth: 17-Jan-1946        Patient Gender: M Patient Age:   57 years Exam Location:  [address] Procedure:      VAS US  ABI WITH/WO TBI Referring Phys: NORMAN SERVE --------------------------------------------------------------------------------  Indications: Peripheral artery disease. Patient reports stable bilateral calf              claudication symptoms after walking about 5-10 minutes. He denies              any rest pain. High Risk  Factors: Hypertension, hyperlipidemia, Diabetes, past history of                    smoking, coronary artery disease, prior CVA.  Comparison Study: On  09/22/2023, a lower arterial Doppler showed an ABI of .66                   on the right and .72 on the left. Performing Technologist: Nanetta Shad RVT  Examination Guidelines: A complete evaluation includes at minimum, Doppler waveform signals and systolic blood pressure reading at the level of bilateral brachial, anterior tibial, and posterior tibial arteries, when vessel segments are accessible. Bilateral testing is considered an integral part of a complete examination. Photoelectric Plethysmograph (PPG) waveforms and toe systolic pressure readings are included as required and additional duplex testing as needed. Limited examinations for reoccurring indications may be performed as noted.  ABI Findings: +---------+------------------+-----+----------+--------+ Right    Rt Pressure (mmHg)IndexWaveform  Comment  +---------+------------------+-----+----------+--------+ Brachial 186                                       +---------+------------------+-----+----------+--------+ PTA      132               0.71 monophasic         +---------+------------------+-----+----------+--------+ DP       116               0.62 monophasic         +---------+------------------+-----+----------+--------+ Great Toe81                0.44 Abnormal           +---------+------------------+-----+----------+--------+ +---------+------------------+-----+----------+---------+ Left     Lt Pressure (mmHg)IndexWaveform  Comment   +---------+------------------+-----+----------+---------+ Brachial 182                                        +---------+------------------+-----+----------+---------+ PTA      0                 0.00 absent    inaudible +---------+------------------+-----+----------+---------+ DP       161               0.87 monophasic           +---------+------------------+-----+----------+---------+ Great Toe48                0.26 Abnormal            +---------+------------------+-----+----------+---------+ +-------+-----------+-----------+------------+------------+ ABI/TBIToday's ABIToday's TBIPrevious ABIPrevious TBI +-------+-----------+-----------+------------+------------+ Right  .71        .44        .66         .25          +-------+-----------+-----------+------------+------------+ Left   .87        .26        .72         .33          +-------+-----------+-----------+------------+------------+  Right ABIs and bilateral TBIs appear essentially unchanged compared to prior study on 09/22/2023. Left ABIs appear increased compared to prior study on 09/22/2023.  Summary: Right: Resting right ankle-brachial index indicates moderate right lower extremity arterial disease. The right toe-brachial index is abnormal. Right toe pressure is >60 mmHg which suggests adequate perfusion for healing.  Left: Resting left ankle-brachial index indicates mild left lower extremity arterial disease. The left toe-brachial index is abnormal.  *See table(s) above for measurements and observations.  Electronically signed by Debby Robertson on 02/09/2024 at 4:58:22 PM.  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   Chronic Follow-up with Dr Sam Continue on metformin , Glipizide  Cont on Losartan  Rx for testing supplies was given      Relevant Orders   Comprehensive metabolic panel with GFR   CBC with Differential/Platelet   TSH   Vitamin B12   VITAMIN D  25 Hydroxy (Vit-D Deficiency, Fractures)   Hemoglobin A1c   Essential hypertension   Chronic Cont on Irbesartan  HCT On Jardiance        CAD (coronary artery disease)   F/u w/Dr Court Cont on Losartan , Lovastatin , ASA      Relevant Orders   Comprehensive metabolic panel with GFR    CBC with Differential/Platelet   TSH   Vitamin B12   VITAMIN D  25 Hydroxy (Vit-D Deficiency, Fractures)   Hemoglobin A1c   RESOLVED: BALANITIS   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   Relevant Orders   Ambulatory referral to Speech Therapy   Insomnia   Other Visit Diagnoses       Aphasia       Relevant Orders   Ambulatory referral to Speech Therapy   Comprehensive metabolic panel with GFR   CBC with Differential/Platelet   TSH   Vitamin B12   VITAMIN D  25 Hydroxy (Vit-D Deficiency, Fractures)   Hemoglobin A1c     Paresthesia       Relevant Orders   Vitamin B12     Vitamin D  deficiency       Relevant Orders   VITAMIN D  25 Hydroxy (Vit-D Deficiency, Fractures)         Meds ordered this encounter  Medications   Continuous Glucose Receiver (DEXCOM G6 RECEIVER) DEVI    Sig: Use as directed    Dispense:  1 each    Refill:  1   Continuous Glucose Sensor (DEXCOM G7 SENSOR) MISC    Sig: Replace q 10 days    Dispense:  3 each    Refill:  11   zolpidem  (AMBIEN ) 5 MG tablet    Sig: Take 1 tablet (5 mg total) by mouth at bedtime as needed for sleep.    Dispense:  30 tablet    Refill:  3      Follow-up: No follow-ups on file.  Marolyn Noel, MD "

## 2024-03-25 NOTE — Assessment & Plan Note (Signed)
 Chronic Follow-up with Dr Sam Continue on metformin , Glipizide  Cont on Losartan  Rx for testing supplies was given

## 2024-03-25 NOTE — Assessment & Plan Note (Signed)
Chronic Cont on Irbesartan HCT On Jardiance

## 2024-03-25 NOTE — Assessment & Plan Note (Signed)
 F/u w/Dr Allyson Sabal Cont on Losartan, Lovastatin, ASA

## 2024-04-08 ENCOUNTER — Ambulatory Visit: Payer: Self-pay | Admitting: Internal Medicine

## 2024-04-12 ENCOUNTER — Other Ambulatory Visit: Payer: Self-pay | Admitting: Internal Medicine

## 2024-05-06 ENCOUNTER — Ambulatory Visit: Admitting: Internal Medicine

## 2024-05-23 ENCOUNTER — Ambulatory Visit: Admitting: Internal Medicine

## 2024-05-27 ENCOUNTER — Ambulatory Visit: Admitting: Internal Medicine

## 2024-05-30 ENCOUNTER — Ambulatory Visit: Admitting: Pharmacist

## 2024-06-10 ENCOUNTER — Ambulatory Visit: Admitting: Podiatry

## 2024-06-24 ENCOUNTER — Ambulatory Visit: Payer: Medicare (Managed Care) | Admitting: Internal Medicine

## 2024-06-24 ENCOUNTER — Encounter: Payer: Medicare (Managed Care) | Admitting: Internal Medicine

## 2024-06-24 ENCOUNTER — Ambulatory Visit: Payer: Medicare (Managed Care)

## 2024-08-09 ENCOUNTER — Ambulatory Visit (HOSPITAL_COMMUNITY)

## 2024-08-09 ENCOUNTER — Ambulatory Visit: Admitting: Vascular Surgery

## 2024-08-13 ENCOUNTER — Ambulatory Visit (INDEPENDENT_AMBULATORY_CARE_PROVIDER_SITE_OTHER): Admitting: Otolaryngology

## 2024-09-09 ENCOUNTER — Ambulatory Visit: Admitting: Family Medicine
